# Patient Record
Sex: Male | Born: 1955
Health system: Southern US, Community
[De-identification: ages and names within clinical notes are randomized; demographics above are authoritative.]

## PROBLEM LIST (undated history)

## (undated) DIAGNOSIS — C801 Malignant (primary) neoplasm, unspecified: Secondary | ICD-10-CM

---

## 2002-06-30 ENCOUNTER — Emergency Department (HOSPITAL_COMMUNITY): Admission: EM | Admit: 2002-06-30 | Discharge: 2002-06-30 | Payer: Self-pay | Admitting: Emergency Medicine

## 2003-09-01 ENCOUNTER — Emergency Department (HOSPITAL_COMMUNITY): Admission: EM | Admit: 2003-09-01 | Discharge: 2003-09-01 | Payer: Self-pay | Admitting: *Deleted

## 2003-11-14 ENCOUNTER — Emergency Department (HOSPITAL_COMMUNITY): Admission: EM | Admit: 2003-11-14 | Discharge: 2003-11-15 | Payer: Self-pay | Admitting: Emergency Medicine

## 2007-08-01 ENCOUNTER — Ambulatory Visit: Payer: Self-pay | Admitting: Internal Medicine

## 2007-08-01 ENCOUNTER — Inpatient Hospital Stay (HOSPITAL_COMMUNITY): Admission: EM | Admit: 2007-08-01 | Discharge: 2007-08-06 | Payer: Self-pay | Admitting: Emergency Medicine

## 2007-08-23 ENCOUNTER — Ambulatory Visit: Payer: Self-pay | Admitting: Internal Medicine

## 2007-08-23 DIAGNOSIS — D508 Other iron deficiency anemias: Secondary | ICD-10-CM | POA: Insufficient documentation

## 2007-08-23 DIAGNOSIS — D509 Iron deficiency anemia, unspecified: Secondary | ICD-10-CM | POA: Insufficient documentation

## 2007-08-23 DIAGNOSIS — J82 Pulmonary eosinophilia, not elsewhere classified: Secondary | ICD-10-CM

## 2007-08-23 HISTORY — DX: Iron deficiency anemia, unspecified: D50.9

## 2007-11-02 ENCOUNTER — Ambulatory Visit: Payer: Self-pay | Admitting: Internal Medicine

## 2007-11-02 ENCOUNTER — Telehealth (INDEPENDENT_AMBULATORY_CARE_PROVIDER_SITE_OTHER): Payer: Self-pay | Admitting: *Deleted

## 2007-11-02 DIAGNOSIS — F17200 Nicotine dependence, unspecified, uncomplicated: Secondary | ICD-10-CM

## 2007-11-02 DIAGNOSIS — J449 Chronic obstructive pulmonary disease, unspecified: Secondary | ICD-10-CM

## 2007-11-02 HISTORY — DX: Chronic obstructive pulmonary disease, unspecified: J44.9

## 2007-12-06 ENCOUNTER — Ambulatory Visit: Payer: Self-pay | Admitting: Internal Medicine

## 2008-04-18 ENCOUNTER — Telehealth (INDEPENDENT_AMBULATORY_CARE_PROVIDER_SITE_OTHER): Payer: Self-pay | Admitting: *Deleted

## 2008-04-25 ENCOUNTER — Encounter (INDEPENDENT_AMBULATORY_CARE_PROVIDER_SITE_OTHER): Payer: Self-pay | Admitting: *Deleted

## 2008-05-28 ENCOUNTER — Ambulatory Visit: Payer: Self-pay | Admitting: Cardiovascular Disease

## 2008-05-28 ENCOUNTER — Observation Stay (HOSPITAL_COMMUNITY): Admission: EM | Admit: 2008-05-28 | Discharge: 2008-05-30 | Payer: Self-pay | Admitting: Emergency Medicine

## 2008-06-15 ENCOUNTER — Ambulatory Visit: Payer: Self-pay

## 2008-06-19 ENCOUNTER — Ambulatory Visit: Payer: Self-pay | Admitting: Cardiology

## 2008-07-03 ENCOUNTER — Ambulatory Visit: Payer: Self-pay | Admitting: Internal Medicine

## 2008-10-17 ENCOUNTER — Emergency Department (HOSPITAL_COMMUNITY): Admission: EM | Admit: 2008-10-17 | Discharge: 2008-10-17 | Payer: Self-pay | Admitting: Emergency Medicine

## 2010-06-09 ENCOUNTER — Encounter: Payer: Self-pay | Admitting: Family Medicine

## 2010-08-26 LAB — DIFFERENTIAL
Eosinophils Absolute: 0.1 10*3/uL (ref 0.0–0.7)
Lymphocytes Relative: 22 % (ref 12–46)
Lymphs Abs: 1.4 10*3/uL (ref 0.7–4.0)
Neutro Abs: 4.2 10*3/uL (ref 1.7–7.7)
Neutrophils Relative %: 67 % (ref 43–77)

## 2010-08-26 LAB — CBC
HCT: 37.2 % — ABNORMAL LOW (ref 39.0–52.0)
Hemoglobin: 11.8 g/dL — ABNORMAL LOW (ref 13.0–17.0)
RDW: 15 % (ref 11.5–15.5)
WBC: 6.4 10*3/uL (ref 4.0–10.5)

## 2010-08-26 LAB — BASIC METABOLIC PANEL
CO2: 27 mEq/L (ref 19–32)
GFR calc Af Amer: >60 mL/min (ref >60)
GFR calc non Af Amer: >60 mL/min (ref >60)
Glucose, Bld: 95 mg/dL (ref 70–99)

## 2010-09-02 LAB — CBC
HCT: 36.9 % — ABNORMAL LOW (ref 39.0–52.0)
Hemoglobin: 11.7 g/dL — ABNORMAL LOW (ref 13.0–17.0)
MCHC: 31.8 g/dL (ref 30.0–36.0)
MCV: 80.7 fL (ref 78.0–100.0)
Platelets: 220 10*3/uL (ref 150–400)
RBC: 4.58 MIL/uL (ref 4.22–5.81)
RDW: 14.8 % (ref 11.5–15.5)

## 2010-09-02 LAB — POCT CARDIAC MARKERS
CKMB, poc: <1 ng/mL — ABNORMAL LOW (ref 1.0–8.0)
Myoglobin, poc: 40.1 ng/mL (ref 12–200)

## 2010-09-02 LAB — CK TOTAL AND CKMB (NOT AT ARMC)
CK, MB: 0.7 ng/mL (ref 0.3–4.0)
Relative Index: INVALID (ref 0.0–2.5)
Total CK: 98 U/L (ref 7–232)

## 2010-09-02 LAB — LIPID PANEL
Cholesterol: 113 mg/dL (ref 0–200)
Total CHOL/HDL Ratio: 3.6 RATIO
VLDL: 13 mg/dL (ref 0–40)

## 2010-09-02 LAB — URINE MICROSCOPIC-ADD ON

## 2010-09-02 LAB — URINALYSIS, ROUTINE W REFLEX MICROSCOPIC
Bilirubin Urine: NEGATIVE
Leukocytes, UA: NEGATIVE
Nitrite: NEGATIVE
Protein, ur: 30 mg/dL — AB
Specific Gravity, Urine: 1.036 — ABNORMAL HIGH (ref 1.005–1.030)
Urobilinogen, UA: 1 mg/dL (ref 0.0–1.0)

## 2010-09-02 LAB — CARDIAC PANEL(CRET KIN+CKTOT+MB+TROPI)
CK, MB: 0.7 ng/mL (ref 0.3–4.0)
CK, MB: 0.8 ng/mL (ref 0.3–4.0)
Relative Index: INVALID (ref 0.0–2.5)
Total CK: 72 U/L (ref 7–232)
Total CK: 81 U/L (ref 7–232)
Troponin I: 0.01 ng/mL (ref 0.00–0.06)

## 2010-09-02 LAB — COMPREHENSIVE METABOLIC PANEL
AST: 23 U/L (ref 0–37)
Albumin: 3.5 g/dL (ref 3.5–5.2)
Alkaline Phosphatase: 49 U/L (ref 39–117)
GFR calc non Af Amer: >60 mL/min (ref >60)
Potassium: 3.6 mEq/L (ref 3.5–5.1)
Total Protein: 6 g/dL (ref 6.0–8.3)

## 2010-09-02 LAB — DIFFERENTIAL
Basophils Absolute: 0 10*3/uL (ref 0.0–0.1)
Basophils Relative: 1 % (ref 0–1)
Eosinophils Absolute: 0.1 10*3/uL (ref 0.0–0.7)
Lymphs Abs: 1.6 10*3/uL (ref 0.7–4.0)
Monocytes Absolute: 0.6 10*3/uL (ref 0.1–1.0)

## 2010-09-02 LAB — LIPASE, BLOOD: Lipase: 23 U/L (ref 11–59)

## 2010-09-02 LAB — TROPONIN I: Troponin I: <0.01 ng/mL (ref 0.00–0.06)

## 2010-10-01 NOTE — H&P (Signed)
[name] [name], [name] [name]             ACCOUNT NO.:  [id number]   MEDICAL RECORD NO.:  [id number]          PATIENT TYPE:  INP   LOCATION:  1424                         FACILITY:  [facility]   PHYSICIAN:  Della Goo, M.D. DATE OF BIRTH:  1955/08/19   DATE OF ADMISSION:  05/28/2008  DATE OF DISCHARGE:                              HISTORY & PHYSICAL   PRIMARY CARE PHYSICIAN:  Unassigned.   CHIEF COMPLAINT:  Chest pain.   HISTORY OF PRESENT ILLNESS:  This is a 55 year old male who presented to  the emergency department with complaints of chest pain, which he has had  off and on for the past 2 days.  He states that he has had nausea and  vomiting along with shortness of breath associated with the pain.  He  states that the discomfort is in the middle of his chest.  He denies  having any cough, congestion, fevers or chills.   PAST MEDICAL AND SURGICAL HISTORY:  The past medical history is  significant for cerebral aneurysm status post craniotomy.   MEDICATIONS:  The patient's medications at this time are none.   ALLERGIES:  No known drug allergies.   SOCIAL HISTORY:  The patient is a smoker; he has smoked one pack of  cigarettes daily for 30+ years.  He also reports drinking alcohol; he  states that he drinks two 40-ounce beers twice a week; however, he  states that he did drink heavily years before.   FAMILY HISTORY:  The family history is negative for coronary artery  disease, hypertension and diabetes.  His mother is deceased and did have  cancer.   REVIEW OF SYSTEMS:  The review of systems pertinents are mentioned  above.   PHYSICAL EXAMINATION:  GENERAL APPEARANCE:  The physical examination  findings are that this is a thin, well-developed 55 year old male in no  visible discomfort or acute distress.  VITAL SIGNS:  His vital signs are temperature 97.1, blood pressure  109/67, heart rate 91, respirations 20, and O2 saturation is 100%.  HEENT:  Normocephalic and atraumatic  head.  There is no scleral icterus  or scleral injection nor conjunctival pallor.  Pupils are equally round  and react to light.  Extraocular movements are intact.  Funduscopic is  benign.  Oropharynx is clear.  NECK:  The neck is supple with full range of motion.  No thyromegaly,  adenopathy or jugulovenous distention.  HEART:  Cardiovascular - regular rate and rhythm.  No murmurs, gallops  or rubs.  Lungs:  The lungs are clear to auscultation bilaterally.  ABDOMEN:  The abdomen has positive bowel sounds.  It is soft, nontender  and nondistended.  EXTREMITIES:  The extremities are without cyanosis, clubbing or edema.  NEUROLOGIC EXAMINATION:  The neurological examination is nonfocal.   LABORATORY STUDIES:  White blood cell count 3.6, hemoglobin 11.7,  hematocrit 36.9, platelets 220,000, with MCV 80.7, neutrophils 36% and  lymphocytes 44%.  Sodium 142, potassium 3.6, chloride 109, carbon  dioxide 27, BUN 13, creatinine 0.93, and glucose 106.  Lipase 23.  Chest  x-ray revealed extensive scarring of the left upper lobe.  No acute  infiltrates or effusions were seen.  EKG revealed a normal sinus rhythm  without acute ST-segment changes.  Point of care cardiac markers with a  myoglobin of 40.1, Ck/MB less than 1.0 and  troponin less than 0.05.  Albumin 3.5, AST 23 and ALT 17.   ASSESSMENT:  The patient is a 55 year old male being admitted with chest  pain.   PLAN:  1. The patient will be admitted to a telemetry area for cardiac      monitoring.  2. Cardiac enzymes will be performed.  3. The patient will be placed on nitrates, oxygen and aspirin therapy.  4. DVT and GI prophylaxes have also been ordered.  5. Further workup will ensue pending results of his studies and his      clinical course.  6. Stress testing will be considered.      Della Goo, M.D.  Electronically Signed     HJ/MEDQ  D:  05/29/2008  T:  05/30/2008  Job:  [id number]

## 2010-10-01 NOTE — Assessment & Plan Note (Signed)
[facility]                            CARDIOLOGY OFFICE NOTE   NAME:[name], [name]                      MRN:          [id number]  DATE:06/19/2008                            DOB:          August 08, 1955    PRIMARY CARE PHYSICIAN:  The patient has no primary care physician.   HISTORY OF PRESENT ILLNESS:  This is a 55 year old with a history of  tobacco abuse and recent admission to [facility] with chest  pain.  The patient was admitted through the emergency department at  [facility] in early January 2010 with a couple of days of nausea and  vomiting.  It was associated with chest pain across the patient's upper  abdomen and lower chest in a band.  The chest and abdominal pain had  been prolonged.  The patient was admitted.  Cardiac enzymes were  negative x3.  The patient had a CT of his abdomen with no acute  findings.  The patient was hydrated, given antiemetic medications, and  was eventually discharged for followup with exercise treadmill Myoview  as an outpatient.  The patient did have that exercise treadmill Myoview  done last week.  He exercised for 10 minutes.  He did only reach 77% of  his maximal predicted heart rate, which is submaximal.  He had no chest  pain.  He stopped due to fatigue.  There was no evidence for scar or  ischemia on the perfusion portion of the exam.  Since discharge, the  patient has had no further episodes of chest pain, nausea, or vomiting.  He has been having normal bowel movements with no melena or bright red  blood per rectum.  He did start on omeprazole after discharge from the  hospital, and he feels this helped his symptoms considerably.  He does  walk for 30-45 minutes a day with no chest pain or shortness of breath.  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  He does continue to smoke  about half pack a day.   PAST MEDICAL HISTORY:  1. Cerebral aneurysm status post craniotomy years ago.  2. Tobacco  abuse.  The patient smokes about half pack a day.  3. Prior pneumonia.  4. Prior heavy alcohol abuse.  5. Exercise treadmill Myoview, January 2010.  The patient reached 77%      of his maximal predicted heart rate.  He reached 11.7 METS and      exercised for 10 minutes.  He had no chest pain.  EF was 60%.      Normal wall motion.  There was no evidence for scar or ischemia on      the perfusion images.   MEDICATIONS:  1. Aspirin 81 mg a day.  2. Omeprazole 20 mg a day.   SOCIAL HISTORY:  The patient lives with his girlfriend.  He has no  children.  He works as a Financial risk analyst at [employer].  He smokes half pack a day  and has done so for many years.  He used to drink alcohol heavily, now  he only drinks occasionally.  LABORATORY DATA:  From January 2010; LDL 69, HDL 31, triglycerides 63,  hemoglobin A1c 5.7; cardiac enzymes negative x3; TSH is normal.   REVIEW OF SYSTEMS:  Negative except as noted in the history of present  illness.   PHYSICAL EXAMINATION:  VITAL SIGNS:  Blood pressure is 129/78, heart  rate is 93.  GENERAL:  This is a well-developed male in no apparent distress.  NEUROLOGIC:  Alert and oriented x3.  Normal affect.  LUNGS:  Clear to auscultation bilaterally with mildly prolonged  expiratory phase and normal respiratory effort.  NECK:  There is no JVD.  There is no thyromegaly or thyroid nodule.  HEENT:  Normal exam.  CARDIOVASCULAR:  Heart, regular S1 and S2.  No S3 and No S4.  No murmur.  No peripheral edema, 2+ posterior tibial pulses bilaterally.  There is  no carotid bruit.  ABDOMEN:  Soft, nontender.  No hepatosplenomegaly.  Normal bowel sounds.  EXTREMITIES:  No clubbing or cyanosis.  SKIN:  Normal exam.  MUSCULOSKELETAL:  Normal exam.   ASSESSMENT/PLAN:  This is a 55 year old with history of tobacco abuse,  who presents to Cardiology Clinic in followup after an episode of  prolonged chest pain with nausea and vomiting, which precipitated an  inpatient  admission.  1. Chest/abdominal pain associated with nausea and vomiting.  CT      abdomen was negative while in the hospital.  Cardiac enzymes were      negative x3.  The patient did have a exercise treadmill Myoview      done.  He exercised for 10 minutes, which is reasonably good      exercise tolerance; however, he only reached 77% of his maximum      predicted heart rate.  There is no evidence for scar or ischemia on      the perfusion images.  This test was submaximal; however, I do      think his chest pain is unlikely to have been cardiac in nature.  I      think it may have possibly been due to peptic ulcer disease, given      the relief of symptoms with proton pump inhibitor.  I did recommend      the patient continue to take his proton pump inhibitor along with a      low-dose aspirin 81 mg daily.  I do not think he needs any further      cardiac testing.  He has excellent exercise tolerance without chest      pain on exertion.  2. Smoking.  I did strongly encourage the patient to stop using      tobacco.  He plans to purchase nicotine patches over the counter.  3. We did tell the patient that we set him up with a primary care      physician.  He states he used to see someone across the street from      [employer], so we will set up with [facility] doctors at      [facility].     Marca Ancona, MD  Electronically Signed    DM/MedQ  DD: 06/19/2008  DT: 06/20/2008  Job #: [id number]

## 2010-10-01 NOTE — Discharge Summary (Signed)
[name] [name], [name] [name]             ACCOUNT NO.:  [id number]   MEDICAL RECORD NO.:  [id number]          PATIENT TYPE:  INP   LOCATION:  1424                         FACILITY:  [facility]   PHYSICIAN:  Eduard Clos, MDDATE OF BIRTH:  1956-05-12   DATE OF ADMISSION:  05/28/2008  DATE OF DISCHARGE:  05/30/2008                               DISCHARGE SUMMARY   COURSE IN THE HOSPITAL:  A 55 year old male with no significant  past  medical history.  Presents with complaint of chest pain.  Patient  admitted to the telemetry floor.  Cardiac enzymes and EKG were followed  which were within acceptable limits.  Since patient had risk factors, a  cardiology consult was obtained.  Per cardiology, patient can be  followed as outpatient for stress test for which they will be arranging.  Patient also had some left flank pain with some nausea earlier which has  subsided.  A CT urogram was done.  I did discuss with the cardiologist  who advised that patient had nothing acute.  As patient is tolerating  diet, I advised patient that his CT had shown nothing acute but did show  enlarged prostate.  He will need further workup on this as outpatient  with a primary care physician, for which I did advise that I can provide  some names and help with the primary care physician.  Patient said, no,  he has his own primary care physician who he did not follow and he will  be following as outpatient once he gets discharged.  Patient was  strongly advised to quit smoking.  Patient also advised that he needs to  follow with cardiology for which a number has been provided, need to  call.  At the time of discharge, patient is hemodynamically stable.   PROCEDURES DURING HIS STAY:  1. Chest x-ray on May 28, 2008.  Nothing acute, scanning the left      upper lobe.  2. CT abdomen and pelvis without contrast on May 30, 2008.      Negative for acute urinary tract obstruction not __________.  Mild      enlargement  of prostate, no acute findings.   FINAL DIAGNOSES:  1. Chest pain, rule out acute coronary syndrome.  2. Tobacco abuse.  3. Left flank pain and computed tomography negative.   MEDICATION AT DISCHARGE:  1. Aspirin 81 mg p.o. daily.  2. Omeprazole 40 mg p.o. daily.  3. Nicotine patch, as advised.   PLAN:  Patient advised to follow up with his primary care physician  within a week's time.  Advised to have further workup through his  primary care physician for his enlarged prostate.  To follow up with  cardiology as advised and to schedule to call up [phone] and make  sure laboratories are finally scheduled.  Patient strongly advised to  quit smoking.  To be on a cardiac healthy diet.     Eduard Clos, MD  Electronically Signed    ANK/MEDQ  D:  05/30/2008  T:  05/30/2008  Job:  [id number]

## 2010-10-01 NOTE — Discharge Summary (Signed)
NAMEKITT, LEDET             ACCOUNT NO.:  [id number]   MEDICAL RECORD NO.:  [id number]          PATIENT TYPE:  INP   LOCATION:  1439                         FACILITY:  [facility]   PHYSICIAN:  Mobolaji B. Bakare, M.D.DATE OF BIRTH:  October 19, 1955   DATE OF ADMISSION:  08/01/2007  DATE OF DISCHARGE:  08/06/2007                               DISCHARGE SUMMARY   FINAL DIAGNOSES:  1. Pneumonia (suspicious for cavitating pneumonia versus bullous lung      disease).  2. Ongoing tobacco abuse.  3. Microcytic anemia.   CONSULTANTS:  Pulmonary consult provided by Dr. Sherene Sires.   RECOMMENDATIONS:  1. Repeat chest x-ray PA and lateral in 2 weeks.  2. Follow up with Dr. Sherene Sires post chest x-ray.  3. Follow up with [facility] in 1 week.   PROCEDURE:  1. Chest x-ray done on July 31, 2007:  Showed patchy bilateral      airspace opacity greatest in the left lower lobe likely      representing pneumonia, bilateral hilar prominence left greater      than right.  Bleb/cavitary process in left upper lobe.  2. Chest CT scan done on July 31, 2007:  Showed multilobar airspace      disease with bronchial wall thickening most prominent in the left      lower lobe likely bronchopneumonia.  Cavitary lesion in the left      upper lobe may represent a cavitary pneumonia.  Small mediastinal      right hilar lymph nodes were identified.   BRIEF HISTORY:  In brief, Mr. Richart is a 55 year old African American  male who presented to the emergency room with fever, cough and blood  streaked sputum for 3 weeks and weight loss for about 1 month.  He could  not quantify the weight loss, but he does not reckon that it is up to 10  pounds.   Initial evaluation revealed a temperature of 101.5, pulse 94, blood  pressure of 120/64.  Chest x-ray was suspicious for cavitating  pneumonia.  This was followed by CT scan.  CT scan of the chest which  reported multilevel airspace disease and cavitary lesion in the left  upper lobe felt to represent pneumonia, but differentials include  tuberculosis.  Hence, the patient was admitted to a respiratory unit  room.  He was placed on respiratory precautions and was started on  Rocephin and Zithromax.  Sputums were sent for AFB smears.   HOSPITAL COURSE:  The patient was initially investigated for pulmonary  tuberculosis.  He had three sets of sputum smears.  These were all  negative.  He had a PPD done which was negative.  HIV was also negative.  He was seen by Dr. Sherene Sires in consultation.  The opinion that this is less  likely TB and the cavitary lesion in the left upper lobe is likely  representing bullous disease.  He agreed with antibiotics and  recommended followup chest x-ray in 2 weeks.   The patient received 6 days treatment with Zithromax and Rocephin  intravenously.  Cleocin was also started during the course of  hospitalization.  He was discharged home on Cleocin.   The patient became afebrile.  Cough and sputum production have improved.  White cell count has normalized.  He is saturating at 100% on room air.  He is stable at this point to be discharged home.   Microcytic anemia.  He was noted to have a hemoglobin of 10.2 with MCV  76.3.  Hemoglobin remained stable during the course of hospitalization.  He had anemia panel with ferritin of 413, TIBC 259, iron 32% saturation,  vitamin B-12 843, folate 11.1.  The patient was started on iron  supplement.  I will defer further anemia workup to his PCP.  He should  be following up at [facility].   DISCHARGE MEDICATIONS:  1. Cleocin 600 mg p.o. t.i.d. for 7 days.  2. Ferrous sulfate one tablet b.i.d.   PERTINENT LABORATORY DATA:  Strep pneumonia urinary antigen negative.  Sed rate 64.  Anemia panel as mentioned in the HPI.  Acute hepatitis  panel was negative.  HIV negative.  Legionella urine antigen negative.   DISCHARGE LABORATORY DATA:  Hemoglobin 10.2, hematocrit 30.3, platelets  347, white  cell 8.7, MCV 76.3, sodium 142, potassium 3.9, chloride 113,  CO2 112, BUN 1, creatinine 0.69, calcium 9.0.      Mobolaji B. Corky Downs, M.D.  Electronically Signed     MBB/MEDQ  D:  08/06/2007  T:  08/06/2007  Job:  [id number]   cc:   Charlaine Dalton. Sherene Sires, MD, FCCP  [address]  [place] [address]

## 2010-10-01 NOTE — Discharge Summary (Signed)
[name] [name], [name] [name]             ACCOUNT NO.:  [id number]   MEDICAL RECORD NO.:  [id number]          PATIENT TYPE:  INP   LOCATION:  1424                         FACILITY:  [facility]   PHYSICIAN:  Eduard Clos, MDDATE OF BIRTH:  04-13-1956   DATE OF ADMISSION:  05/28/2008  DATE OF DISCHARGE:  05/30/2008                               DISCHARGE SUMMARY   COURSE IN THE HOSPITAL:  A 55 year old male with no significant  past  medical history.  Presents with complaint of chest pain.  Patient  admitted to the telemetry floor.  Cardiac enzymes and EKG were followed  which were within acceptable limits.  Since patient had risk factors, a  cardiology consult was obtained.  Per cardiology, patient can be  followed as outpatient for stress test for which they will be arranging.  Patient also had some left flank pain with some nausea earlier which has  subsided.  A CT urogram was done.  I did discuss with the cardiologist  who advised that patient had nothing acute.  As patient is tolerating  diet, I advised patient that his CT had shown nothing acute but did show  enlarged prostate.  He will need further workup on this as outpatient  with a primary care physician, for which I did advise that I can provide  some names and help with the primary care physician.  Patient said, no,  he has his own primary care physician who he did not follow and he will  be following as outpatient once he gets discharged.  Patient was  strongly advised to quit smoking.  Patient also advised that he needs to  follow with cardiology for which a number has been provided, need to  call.  At the time of discharge, patient is hemodynamically stable.   PROCEDURES DURING HIS STAY:  1. Chest x-ray on May 28, 2008.  Nothing acute, scanning the left      upper lobe.  2. CT abdomen and pelvis without contrast on May 30, 2008.      Negative for acute urinary tract obstruction not __________.  Mild      enlargement  of prostate, no acute findings.   FINAL DIAGNOSES:  1. Chest pain, rule out acute coronary syndrome.  2. Tobacco abuse.  3. Left flank pain and computed tomography negative.   MEDICATION AT DISCHARGE:  1. Aspirin 81 mg p.o. daily.  2. Omeprazole 40 mg p.o. daily.  3. Nicotine patch, as advised.   PLAN:  Patient advised to follow up with his primary care physician  within a week's time.  Advised to have further workup through his  primary care physician for his enlarged prostate.  To follow up with  cardiology as advised and to schedule to call up [phone] and make  sure laboratories are finally scheduled.  Patient strongly advised to  quit smoking.  To be on a cardiac healthy diet.      Eduard Clos, MD    ANK/MEDQ  D:  05/30/2008  T:  05/30/2008  Job:  [id number]

## 2010-10-01 NOTE — H&P (Signed)
NAMENICOLIS, BOODY             ACCOUNT NO.:  [id number]   MEDICAL RECORD NO.:  [id number]          PATIENT TYPE:  EMS   LOCATION:  ED                           FACILITY:  [facility]   PHYSICIAN:  Raphael Gibney, MD        DATE OF BIRTH:  04-13-1956   DATE OF ADMISSION:  07/31/2007  DATE OF DISCHARGE:                              HISTORY & PHYSICAL   PRIMARY CARE PHYSICIAN:  Unassigned.   CHIEF COMPLAINT:  Productive cough with streaky hemoptysis, fevers of 3  weeks' duration, weight loss of few months' duration.   HISTORY OF PRESENT ILLNESS:  Mr. Siddhartha is a 55 year old African-  American male who comes into the emergency department with complaints of  worsening productive cough and fevers of 3 weeks' duration. He states  that his cough initially started off with minimal amounts of white  phlegm 3 weeks ago and ever since, has progressed to yellowish sputum  with unrelenting paroxysms of cough. He also reports moderate to high  grade fever at night, although he has not measured any of the fever  spikes. He states that there is also quite a bit of sweating associated  with these fevers. He does report some amount of streaky hemoptysis on  and off with the phlegm. He also mentions a sharp pain in the epigastric  and the left thoracic region during paroxysms of cough. Mr. Lucia also  states that he has been having a poor appetite in general for the last  many months. He also states that he is having some amount of weight  loss, although he has not measured his weight in the last few weeks. He  states that he does not have a primary care physician and has not seen a  physician in the last few years. He denies any new indications or recent  exposure to exotic pets at this time. He does give a history of his  male roommate having similar symptoms, although her symptoms started  to improve while his got worse over the last few days.   He denies any diarrhea, weakness, skin changes,  blurriness, paroxysmal  nocturnal dyspnea, orthopnea, nausea, vomiting, blood in his urine or  stools, polyuria, polydipsia at this time. He does give a history of a  dull headache, which has been present for many months now.   PAST MEDICAL HISTORY:  He denies any significant medical history of  coronary artery disease, diabetes mellitus, hypertension, seizure,  stroke, kidney disease. He has not sought primary preventive care in  many years. He does give a history of a brain aneurysm, which was  repaired many years ago.   ALLERGIES:  No known drug allergies.   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 He currently  shares a room with a male friend. He does give a history of 1/2 to 1  pack a day smoking for the last 20 to 25 years. Does give a history of  occasional beer, although he used to drink 1 case of beer a week a few  years ago. Denies any IV drug use  but does give a positive history for  marijuana use in the past.   FAMILY HISTORY:  Mother is currently deceased. Father is alive and  healthy. Denies any significant family history of medical problems.   MEDICATIONS:  Currently not on any medications.   REVIEW OF SYSTEMS:  GENERAL:  Mr. Ringo does give a positive history of  weight loss, fevers, chills. Denies any significant history of trauma,  nausea, vomiting, hypertension, palpitations, orthopnea, PND, nausea and  vomiting, dysphagia, change in stool habits, change in urinary  frequency, polyuria, polydipsia, polyphagia or denies nay changes in  mood or depression.   PHYSICAL EXAMINATION:  VITAL SIGNS:  Pulse 94, blood pressure 120/64,  temperature 101.5, respiratory rate 18.  GENERAL:  Mr. Syris is a poorly nourished African-American male in no  acute distress.  HEENT:  Pupils are equal, round, and reactive to light. Extraocular  muscles intact. No cyanosis, pallor, icterus noted.  LYMPHATICS:  No clearly palpable lymphadenopathy in the  supraclavicular  or infraclavicular or cervical region. A small left axillary lymph node  is palpable although right axilla is clear. There is tiny bilateral  inguinal lymphadenopathy noted.  RESPIRATORY:  Poor energy bilaterally. Some expiratory rhonchi in the  lower lobes. Post-tussive cough in the left upper zone on auscultation.  ABDOMEN:  Soft. Tenderness elicited in both the left and right upper  quadrant. No clearly palpable hepatosplenomegaly.  NEUROLOGIC:  Cranial nerves 2-12 are grossly intact. Motor function 5/5  in upper limbs. In lower limbs, sensory systems grossly intact.  Cerebellar function is within normal limits.  EXTREMITIES:  No clubbing, cyanosis, or edema.   LABORATORY DATA:  Hemoglobin and hematocrit 10.4 and 32.1. White blood  cell count 10.8. Platelets 361,000. Sodium 136, potassium 3.0, chloride  102, CO2 24, creatinine 0.8, BUN 4, glucose 99, calcium 8.3.   Chest x-ray, left lower lobe air space disease, bilateral hilar  prominence, and left lower lobe cavitary lesion noted. CT scan of the  chest pending at the time of this dictation.   ASSESSMENT/PLAN:  54. A 55 year old African-American male with cavitary lesion in the      left upper lobe and areaconsolidation in the left lower lobe. Will      admit to the inpatient service team [facility] of [facility].      Will start IV antibiotics in the form of IV Rocephin 1 gram q.24      hours and IV Zithromax 500 mg followed by 250 mg daily. Will also      start clindamycin IV 600 mg first two doses, followed by 300 mg      q.i.d. for anaerobic coverage. Will pan culture in the form of      sputum for bacteria and fungus. Blood culture, urine culture. Will      obtain AFB x3 and place a PPD due to the large cavitary lesion      noted on the left upper lobe. CT scan of the chest was pending at      the time of this dicussion. Will consider pulmonary input in      management, as this lesion appears to have the  potential to cause a      pneumothorax.  2. Mild tenderness on abdominal exam. Will obtain a hepatitis panel      and a hepatic enzyme panel.  3. Assess patient of immunocompromise state:  After discussion with      the patient, it was decided to draw for  an HIV test. The patient      has consented for this blood test.  4. Assess patient for tuberculosis: Would place the patient on      respiratory isolation until the 3 AFBs come      negative and PPD has been read off.  5. Prophylaxis:  GI prophylaxis in the form of PPI and DVT prophylaxis      in the form of SCDs.   CODE STATUS:  FULL CODE.      Raphael Gibney, MD  Electronically Signed     HV/MEDQ  D:  07/31/2007  T:  08/01/2007  Job:  [id number]

## 2010-10-01 NOTE — Consult Note (Signed)
[name] [name], [name] [name]             ACCOUNT NO.:  [id number]   MEDICAL RECORD NO.:  [id number]          PATIENT TYPE:  INP   LOCATION:  1424                         FACILITY:  [facility]   PHYSICIAN:  Verne Carrow, MDDATE OF BIRTH:  [birth year]   DATE OF CONSULTATION:  05/29/2008  DATE OF DISCHARGE:                                 CONSULTATION   ADMITTING SERVICE:  [facility].   REASON FOR CONSULTATION:  Chest pain.   HISTORY OF PRESENT ILLNESS:  Mr. Winningham is a pleasant 55 year old  African American male with a history of tobacco abuse, but no prior  history of cardiac disease who presented to the emergency department at  [facility] on May 28, 2008 with complaints of chest pain.  The patient tells me that 2 days prior to admission he awoke with nausea  and had an episode of vomiting.  He noticed mild chest discomfort after  having the episode of vomiting.  He went to work and tried to work  through most of the day, but had continuous mild chest discomfort during  the whole day.  He continued to have episodes of nausea and vomiting as  well as dizziness.  He denies having had any episodes of shortness of  breath, palpitations, syncope or orthopnea.  He has never had problems  with lower extremity edema or PND.  He tells me that his pain was dull  in nature and located in the center of his chest.  There was no  radiation of pain.  As stated above, the pain persisted for the last 3  days.  He was admitted to a telemetry bed where he has had 1 set of  negative cardiac enzymes at 11:55 p.m. last night.   PAST MEDICAL HISTORY:  1. Cerebral aneurysm status post craniotomy many years ago.  2. There is no prior history of hypertension, hyperlipidemia or      diabetes mellitus.  However, the patient has not been seen by a      physician in many years.  He does tell me that he was treated for      pneumonia earlier this year.   ALLERGIES:  No known drug  allergies.   HOME MEDICATIONS:  No home medications.   SOCIAL HISTORY:  The patient smoked 1/2 to 1 pack of cigarettes per day  for the last 35 years.  He has a history of heavy alcohol abuse, but  currently tells me that he only occasionally drinks beer.  He denies the  use of illicit drugs.  He is single and has no children.  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   FAMILY HISTORY:  The patient's mother died of cancer, and his brother  died from AIDS.  His father is alive and healthy.  He tells me there is  no family history of sudden cardiac death or coronary artery disease.   REVIEW OF SYSTEMS:  As stated in the HPI, and is, otherwise, negative.   PHYSICAL EXAMINATION:  VITALS:  Temperature 99.0, pulse 71, respiratory  rate 12, blood pressure 119/68.  GENERAL:  He is  alert and oriented x3 in no acute distress.  SKIN:  Warm and dry.  PSYCHIATRIC:  Mood and affect are appropriate.  MUSCULOSKELETAL:  Muscle strength and tone are normal.  NEUROLOGICAL:  No focal deficits.  NECK:  No JVD.  No carotid bruits.  No thyromegaly.  No lymphadenopathy.  HEENT:  Normal.  LUNGS:  Clear to auscultation bilaterally without wheezes, rhonchi or  crackles noted.  CARDIOVASCULAR:  Regular rate and rhythm without murmurs, gallops or  rubs noted.  ABDOMEN:  Soft, nontender, nondistended.  Bowel sounds are present.  EXTREMITIES:  No evidence of edema.  Pulses are 2+ in all extremities.   DIAGNOSTIC STUDIES:  1. Laboratory values at time of admission show potassium of 3.6,      creatinine 0.9, hemoglobin 12, platelets 220,000, D-dimer 0.25.      The 1st set of cardiac enzymes show a CK of 98, CK-MB of 0.7, and      troponin of less than 0.01.  2. A 12-lead EKG shows normal sinus rhythm with no signs of ischemia.  3. Chest x-ray shows no acute changes.   ASSESSMENT/PLAN:  This is a 55 year old African American male with a  history of tobacco abuse, but no known diabetes mellitus, hypertension,   hyperlipidemia or family history of heart disease who is admitted to  [facility] after 3 days of continuous mild substernal chest  discomfort associated with nausea, vomiting, and dizziness.  I agree  with continuing to rule the patient out for a myocardial infarction with  serial cardiac enzymes.  At the time of this consultation, he has only  had 1 set of negative cardiac markers.  If he rules out for a myocardial  infarction with serial cardiac enzymes, I would recommend performing an  exercise Myoview stress test.  This can be done as an inpatient or as an  outpatient.  If he does rule in for a myocardial infarction with  enzymes, then I would plan on performing a left heart catheterization.  Our consult team will continue to follow with this patient while he is  in the hospital.      Verne Carrow, MD  Electronically Signed     CM/MEDQ  D:  05/29/2008  T:  05/29/2008  Job:  [id number]

## 2011-02-10 LAB — CBC
HCT: 32.1 — ABNORMAL LOW
HCT: 29.2 — ABNORMAL LOW
Hemoglobin: 10.4 — ABNORMAL LOW
Hemoglobin: 9.6 — ABNORMAL LOW
MCHC: 32.9
MCHC: 33.5
MCV: 76.3 — ABNORMAL LOW
MCV: 76.9 — ABNORMAL LOW
Platelets: 301
Platelets: 347
RBC: 4.24
RBC: 3.97 — ABNORMAL LOW
RDW: 14.7
WBC: 10.8 — ABNORMAL HIGH

## 2011-02-10 LAB — DIFFERENTIAL
Eosinophils Relative: 0
Lymphocytes Relative: 10 — ABNORMAL LOW
Lymphs Abs: 1
Monocytes Absolute: 0.8
Monocytes Relative: 7

## 2011-02-10 LAB — COMPREHENSIVE METABOLIC PANEL
Albumin: 2.3 — ABNORMAL LOW
Alkaline Phosphatase: 79
BUN: 2 — ABNORMAL LOW
Creatinine, Ser: 0.77
Glucose, Bld: 103 — ABNORMAL HIGH
Potassium: 3.5
Total Protein: 5.5 — ABNORMAL LOW

## 2011-02-10 LAB — AFB CULTURE WITH SMEAR (NOT AT ARMC)
Acid Fast Smear: NONE SEEN
Acid Fast Smear: NONE SEEN

## 2011-02-10 LAB — FUNGUS CULTURE W SMEAR: Fungal Smear: NONE SEEN

## 2011-02-10 LAB — URINALYSIS, ROUTINE W REFLEX MICROSCOPIC
Bilirubin Urine: NEGATIVE
Nitrite: NEGATIVE
Protein, ur: NEGATIVE
Specific Gravity, Urine: 1.03
Urobilinogen, UA: 1

## 2011-02-10 LAB — HEPATITIS PANEL, ACUTE
HCV Ab: NEGATIVE
Hep A IgM: NEGATIVE
Hep B C IgM: NEGATIVE
Hepatitis B Surface Ag: NEGATIVE

## 2011-02-10 LAB — IRON AND TIBC
Iron: 32 — ABNORMAL LOW
Saturation Ratios: 12 — ABNORMAL LOW

## 2011-02-10 LAB — HEPATIC FUNCTION PANEL
ALT: 41
AST: 40 — ABNORMAL HIGH
Alkaline Phosphatase: 98
Bilirubin, Direct: 0.2
Indirect Bilirubin: 0.7
Total Bilirubin: 0.9

## 2011-02-10 LAB — BASIC METABOLIC PANEL
BUN: 1 — ABNORMAL LOW
CO2: 24
Calcium: 9
Chloride: 112
Creatinine, Ser: 0.69
GFR calc Af Amer: >60
GFR calc non Af Amer: >60
Glucose, Bld: 99
Potassium: 3 — ABNORMAL LOW
Sodium: 136

## 2011-02-10 LAB — LEGIONELLA ANTIGEN, URINE: Legionella Antigen, Urine: NEGATIVE

## 2011-02-10 LAB — VITAMIN B12: Vitamin B-12: 843 (ref 211–911)

## 2011-02-10 LAB — RETICULOCYTES: Retic Ct Pct: 1.3

## 2011-02-10 LAB — CULTURE, BLOOD (ROUTINE X 2)

## 2011-02-10 LAB — URINE MICROSCOPIC-ADD ON

## 2011-02-10 LAB — INFLUENZA A+B VIRUS AG-DIRECT(RAPID): Influenza B Ag: NEGATIVE

## 2011-02-10 LAB — SEDIMENTATION RATE: Sed Rate: 64 — ABNORMAL HIGH

## 2011-02-10 LAB — CULTURE, RESPIRATORY W GRAM STAIN: Culture: NORMAL

## 2011-02-10 LAB — URINE CULTURE: Culture: NO GROWTH

## 2011-02-10 LAB — STREP PNEUMONIAE URINARY ANTIGEN: Strep Pneumo Urinary Antigen: NEGATIVE

## 2017-10-22 ENCOUNTER — Ambulatory Visit (INDEPENDENT_AMBULATORY_CARE_PROVIDER_SITE_OTHER): Payer: Self-pay | Admitting: Physician Assistant

## 2017-10-22 ENCOUNTER — Other Ambulatory Visit: Payer: Self-pay

## 2017-10-22 ENCOUNTER — Ambulatory Visit (HOSPITAL_COMMUNITY)
Admission: RE | Admit: 2017-10-22 | Discharge: 2017-10-22 | Disposition: A | Discharge status: Home / Self Care | Payer: Self-pay | Source: Ambulatory Visit | Attending: Physician Assistant | Admitting: Physician Assistant

## 2017-10-22 ENCOUNTER — Encounter (INDEPENDENT_AMBULATORY_CARE_PROVIDER_SITE_OTHER): Payer: Self-pay | Admitting: Physician Assistant

## 2017-10-22 VITALS — BP 168/79 | HR 81 | Temp 99.3°F | Ht 62.0 in | Wt 136.4 lb

## 2017-10-22 DIAGNOSIS — M546 Pain in thoracic spine: Secondary | ICD-10-CM

## 2017-10-22 DIAGNOSIS — M5134 Other intervertebral disc degeneration, thoracic region: Secondary | ICD-10-CM | POA: Insufficient documentation

## 2017-10-22 DIAGNOSIS — R030 Elevated blood-pressure reading, without diagnosis of hypertension: Secondary | ICD-10-CM

## 2017-10-22 DIAGNOSIS — R059 Cough, unspecified: Secondary | ICD-10-CM

## 2017-10-22 DIAGNOSIS — J449 Chronic obstructive pulmonary disease, unspecified: Secondary | ICD-10-CM | POA: Insufficient documentation

## 2017-10-22 DIAGNOSIS — R05 Cough: Secondary | ICD-10-CM

## 2017-10-22 DIAGNOSIS — J984 Other disorders of lung: Secondary | ICD-10-CM | POA: Insufficient documentation

## 2017-10-22 DIAGNOSIS — F17200 Nicotine dependence, unspecified, uncomplicated: Secondary | ICD-10-CM

## 2017-10-22 MED ORDER — CETIRIZINE HCL 10 MG PO TABS: 10.0000 mg | ORAL_TABLET | Freq: Every day | ORAL | 5 refills | Status: DC

## 2017-10-22 MED ORDER — NAPROXEN 500 MG PO TABS: 500.0000 mg | ORAL_TABLET | Freq: Two times a day (BID) | ORAL | 1 refills | Status: DC

## 2017-10-22 MED ORDER — ALBUTEROL SULFATE HFA 108 (90 BASE) MCG/ACT IN AERS: 2.0000 | INHALATION_SPRAY | RESPIRATORY_TRACT | 5 refills | Status: DC | PRN

## 2017-10-22 NOTE — Progress Notes (Signed)
Subjective:  Patient ID: [name] [name], male    DOB: [birth year]  Age: 62 y.o. MRN: [id number]  CC: back pain and cough  HPI [name] [name] is a 62 y.o. male with a medical history of COPD, tobacco use disorder, ETOH abuse (quit 2 months), bullous emphysema, and CAP presents as a new patient with complaint of cough and back pain. Cough and back pain since "couple of months ago". Cough worse at night but takes anti-cough medicine from the dollar store, (unknown medicine name) which helps during the day. Also complains of exertional dyspnea. Does not currently use inhalers. Smokes 6-7 cigarettes per day, started smoking since 62 years old. Denies fever, chills, nausea, vomiting, hemoptysis, fall, injury, CP, HA, abdominal pain, urinary frequency, dysuria, rash, or GI sxs.     ROS Review of Systems  Constitutional: Negative for chills, fever and malaise/fatigue.  Eyes: Negative for blurred vision.  Respiratory: Positive for cough. Negative for shortness of breath.   Cardiovascular: Negative for chest pain and palpitations.  Gastrointestinal: Negative for abdominal pain and nausea.  Genitourinary: Negative for dysuria and hematuria.  Musculoskeletal: Positive for back pain. Negative for joint pain and myalgias.  Skin: Negative for rash.  Neurological: Negative for tingling and headaches.  Psychiatric/Behavioral: Negative for depression. The patient is not nervous/anxious.     Objective:  BP (!) 168/79 (BP Location: Right Arm, Patient Position: Sitting, Cuff Size: Normal)   Pulse 81   Temp 99.3 F (37.4 C) (Oral)   Ht 5\' 2"  (1.575 m)   Wt 136 lb 6.4 oz (61.9 kg)   SpO2 97%   BMI 24.95 kg/m   BP/Weight 10/22/2017 07/03/2008 0/35/0093  Systolic BP 818 299 371  Diastolic BP 79 74 68  Wt. (Lbs) 136.4 125.38 121  BMI 24.95 23.7 22.87      Physical Exam  Constitutional: He is oriented to person, place, and time.  Well developed, well nourished, NAD, polite  HENT:  Head:  Normocephalic and atraumatic.  Eyes: No scleral icterus.  Neck: Normal range of motion. Neck supple. No thyromegaly present.  Cardiovascular: Normal rate, regular rhythm and normal heart sounds. Exam reveals no gallop and no friction rub.  No murmur heard. Pulmonary/Chest: Effort normal and breath sounds normal. He has no wheezes.  Diffuse coarse rales  Musculoskeletal: He exhibits no edema.  Patient unable to flex forward uniformly, lower on left side. Scoliosis not apparent on physical exam.Muscular tonicity normal bilaterally.  Neurological: He is alert and oriented to person, place, and time.  Skin: Skin is warm and dry. No rash noted. No erythema. No pallor.  Psychiatric: He has a normal mood and affect. His behavior is normal. Thought content normal.  Vitals reviewed.    Assessment & Plan:   1. Cough - DG Chest 2 View; Future - CBC with Differential - Comprehensive metabolic panel - TB Skin Test; Future - albuterol (PROVENTIL HFA;VENTOLIN HFA) 108 (90 Base) MCG/ACT inhaler; Inhale 2 puffs into the lungs every 4 (four) hours as needed for wheezing or shortness of breath.  Dispense: 1 Inhaler; Refill: 5 - naproxen (NAPROSYN) 500 MG tablet; Take 1 tablet (500 mg total) by mouth 2 (two) times daily with a meal.  Dispense: 14 tablet; Refill: 1 - cetirizine (ZYRTEC) 10 MG tablet; Take 1 tablet (10 mg total) by mouth daily.  Dispense: 30 tablet; Refill: 5  2. Tobacco use disorder - Pt needs to apply for CAFA in order to use [facility] and obtain Chantix.  3. Chronic obstructive  pulmonary disease, unspecified COPD type ([place]) - Will likely need referral to Pulmonologist but patient needs to apply for CAFA  4. Acute bilateral thoracic back pain - XR Thoracic Spine 2 View  5. Elevated blood pressure reading without diagnosis of hypertension - Return in 4 weeks for reevaluation.  Meds ordered this encounter  Medications  . albuterol (PROVENTIL HFA;VENTOLIN HFA) 108 (90 Base)  MCG/ACT inhaler    Sig: Inhale 2 puffs into the lungs every 4 (four) hours as needed for wheezing or shortness of breath.    Dispense:  1 Inhaler    Refill:  5    Order Specific Question:   Supervising Provider    Answer:   Charlott Rakes [[id number]]  . naproxen (NAPROSYN) 500 MG tablet    Sig: Take 1 tablet (500 mg total) by mouth 2 (two) times daily with a meal.    Dispense:  14 tablet    Refill:  1    Order Specific Question:   Supervising Provider    Answer:   Charlott Rakes [[id number]]  . cetirizine (ZYRTEC) 10 MG tablet    Sig: Take 1 tablet (10 mg total) by mouth daily.    Dispense:  30 tablet    Refill:  5    Order Specific Question:   Supervising Provider    Answer:   Charlott Rakes [[id number]]    Follow-up: Return in about 1 month (around 11/19/2017) for BP and cough.   Clent Demark PA

## 2017-10-22 NOTE — Patient Instructions (Addendum)
Chronic Obstructive Pulmonary Disease Chronic obstructive pulmonary disease (COPD) is a long-term (chronic) lung problem. When you have COPD, it is hard for air to get in and out of your lungs. The way your lungs work will never return to normal. Usually the condition gets worse over time. There are things you can do to keep yourself as healthy as possible. Your doctor may treat your condition with:  Medicines.  Quitting smoking, if you smoke.  Rehabilitation. This may involve a team of specialists.  Oxygen.  Exercise and changes to your diet.  Lung surgery.  Comfort measures (palliative care).  Follow these instructions at home: Medicines  Take over-the-counter and prescription medicines only as told by your doctor.  Talk to your doctor before taking any cough or allergy medicines. You may need to avoid medicines that cause your lungs to be dry. Lifestyle  If you smoke, stop. Smoking makes the problem worse. If you need help quitting, ask your doctor.  Avoid being around things that make your breathing worse. This may include smoke, chemicals, and fumes.  Stay active, but remember to also rest.  Learn and use tips on how to relax.  Make sure you get enough sleep. Most adults need at least 7 hours a night.  Eat healthy foods. Eat smaller meals more often. Rest before meals. Controlled breathing  Learn and use tips on how to control your breathing as told by your doctor. Try: ? Breathing in (inhaling) through your nose for 1 second. Then, pucker your lips and breath out (exhale) through your lips for 2 seconds. ? Putting one hand on your belly (abdomen). Breathe in slowly through your nose for 1 second. Your hand on your belly should move out. Pucker your lips and breathe out slowly through your lips. Your hand on your belly should move in as you breathe out. Controlled coughing  Learn and use controlled coughing to clear mucus from your lungs. The steps are: 1. Lean your  head a little forward. 2. Breathe in deeply. 3. Try to hold your breath for 3 seconds. 4. Keep your mouth slightly open while coughing 2 times. 5. Spit any mucus out into a tissue. 6. Rest and do the steps again 1 or 2 times as needed. General instructions  Make sure you get all the shots (vaccines) that your doctor recommends. Ask your doctor about a flu shot and a pneumonia shot.  Use oxygen therapy and therapy to help improve your lungs (pulmonary rehabilitation) if told by your doctor. If you need home oxygen therapy, ask your doctor if you should buy a tool to measure your oxygen level (oximeter).  Make a COPD action plan with your doctor. This helps you know what to do if you feel worse than usual.  Manage any other conditions you have as told by your doctor.  Avoid going outside when it is very hot, cold, or humid.  Avoid people who have a sickness you can catch (contagious).  Keep all follow-up visits as told by your doctor. This is important. Contact a doctor if:  You cough up more mucus than usual.  There is a change in the color or thickness of the mucus.  It is harder to breathe than usual.  Your breathing is faster than usual.  You have trouble sleeping.  You need to use your medicines more often than usual.  You have trouble doing your normal activities such as getting dressed or walking around the house. Get help right away if:    You have shortness of breath while resting.  You have shortness of breath that stops you from: ? Being able to talk. ? Doing normal activities.  Your chest hurts for longer than 5 minutes.  Your skin color is more blue than usual.  Your pulse oximeter shows that you have low oxygen for longer than 5 minutes.  You have a fever.  You feel too tired to breathe normally. Summary  Chronic obstructive pulmonary disease (COPD) is a long-term lung problem.  The way your lungs work will never return to normal. Usually the  condition gets worse over time. There are things you can do to keep yourself as healthy as possible.  Take over-the-counter and prescription medicines only as told by your doctor.  If you smoke, stop. Smoking makes the problem worse. This information is not intended to replace advice given to you by your health care provider. Make sure you discuss any questions you have with your health care provider. Document Released: 10/22/2007 Document Revised: 10/11/2015 Document Reviewed: 12/30/2012 Elsevier Interactive Patient Education  2017 Elsevier Inc.   Cough, Adult Coughing is a reflex that clears your throat and your airways. Coughing helps to heal and protect your lungs. It is normal to cough occasionally, but a cough that happens with other symptoms or lasts a long time may be a sign of a condition that needs treatment. A cough may last only 2-3 weeks (acute), or it may last longer than 8 weeks (chronic). What are the causes? Coughing is commonly caused by:  Breathing in substances that irritate your lungs.  A viral or bacterial respiratory infection.  Allergies.  Asthma.  Postnasal drip.  Smoking.  Acid backing up from the stomach into the esophagus (gastroesophageal reflux).  Certain medicines.  Chronic lung problems, including COPD (or rarely, lung cancer).  Other medical conditions such as heart failure.  Follow these instructions at home: Pay attention to any changes in your symptoms. Take these actions to help with your discomfort:  Take medicines only as told by your health care provider. ? If you were prescribed an antibiotic medicine, take it as told by your health care provider. Do not stop taking the antibiotic even if you start to feel better. ? Talk with your health care provider before you take a cough suppressant medicine.  Drink enough fluid to keep your urine clear or pale yellow.  If the air is dry, use a cold steam vaporizer or humidifier in your bedroom  or your home to help loosen secretions.  Avoid anything that causes you to cough at work or at home.  If your cough is worse at night, try sleeping in a semi-upright position.  Avoid cigarette smoke. If you smoke, quit smoking. If you need help quitting, ask your health care provider.  Avoid caffeine.  Avoid alcohol.  Rest as needed.  Contact a health care provider if:  You have new symptoms.  You cough up pus.  Your cough does not get better after 2-3 weeks, or your cough gets worse.  You cannot control your cough with suppressant medicines and you are losing sleep.  You develop pain that is getting worse or pain that is not controlled with pain medicines.  You have a fever.  You have unexplained weight loss.  You have night sweats. Get help right away if:  You cough up blood.  You have difficulty breathing.  Your heartbeat is very fast. This information is not intended to replace advice given to you by  your health care provider. Make sure you discuss any questions you have with your health care provider. Document Released: 11/01/2010 Document Revised: 10/11/2015 Document Reviewed: 07/12/2014 Elsevier Interactive Patient Education  [employer].

## 2017-10-22 NOTE — Addendum Note (Signed)
Addended by: Domenica Fail D on: 10/22/2017 03:40 PM   Modules accepted: Orders

## 2017-10-23 ENCOUNTER — Other Ambulatory Visit: Payer: Self-pay | Admitting: *Deleted

## 2017-10-23 ENCOUNTER — Telehealth: Payer: Self-pay | Admitting: *Deleted

## 2017-10-23 DIAGNOSIS — R05 Cough: Secondary | ICD-10-CM

## 2017-10-23 DIAGNOSIS — R059 Cough, unspecified: Secondary | ICD-10-CM

## 2017-10-23 LAB — COMPREHENSIVE METABOLIC PANEL
A/G RATIO: 1.8 (ref 1.2–2.2)
ALK PHOS: 70 IU/L (ref 39–117)
ALT: 11 IU/L (ref 0–44)
AST: 11 IU/L (ref 0–40)
Albumin: 4.4 g/dL (ref 3.6–4.8)
BUN / CREAT RATIO: 8 — AB (ref 10–24)
BUN: 6 mg/dL — ABNORMAL LOW (ref 8–27)
Bilirubin Total: 0.3 mg/dL (ref 0.0–1.2)
CALCIUM: 9.6 mg/dL (ref 8.6–10.2)
CO2: 22 mmol/L (ref 20–29)
CREATININE: 0.74 mg/dL — AB (ref 0.76–1.27)
Chloride: 106 mmol/L (ref 96–106)
GFR calc Af Amer: 116 mL/min/{1.73_m2} (ref >59)
GFR calc non Af Amer: 100 mL/min/{1.73_m2} (ref >59)
GLOBULIN, TOTAL: 2.4 g/dL (ref 1.5–4.5)
Glucose: 80 mg/dL (ref 65–99)
Potassium: 3.9 mmol/L (ref 3.5–5.2)
SODIUM: 142 mmol/L (ref 134–144)
Total Protein: 6.8 g/dL (ref 6.0–8.5)

## 2017-10-23 LAB — CBC WITH DIFFERENTIAL/PLATELET
BASOS: 0 %
Basophils Absolute: 0 10*3/uL (ref 0.0–0.2)
EOS (ABSOLUTE): 0.1 10*3/uL (ref 0.0–0.4)
EOS: 2 %
HEMATOCRIT: 36.3 % — AB (ref 37.5–51.0)
HEMOGLOBIN: 11.6 g/dL — AB (ref 13.0–17.7)
Immature Grans (Abs): 0 10*3/uL (ref 0.0–0.1)
Immature Granulocytes: 0 %
LYMPHS ABS: 2.1 10*3/uL (ref 0.7–3.1)
Lymphs: 38 %
MCH: 24.2 pg — ABNORMAL LOW (ref 26.6–33.0)
MCHC: 32 g/dL (ref 31.5–35.7)
MCV: 76 fL — AB (ref 79–97)
MONOCYTES: 10 %
MONOS ABS: 0.5 10*3/uL (ref 0.1–0.9)
Neutrophils Absolute: 2.7 10*3/uL (ref 1.4–7.0)
Neutrophils: 50 %
Platelets: 290 10*3/uL (ref 150–450)
RBC: 4.8 x10E6/uL (ref 4.14–5.80)
RDW: 16.4 % — ABNORMAL HIGH (ref 12.3–15.4)
WBC: 5.4 10*3/uL (ref 3.4–10.8)

## 2017-10-23 MED ORDER — ALBUTEROL SULFATE HFA 108 (90 BASE) MCG/ACT IN AERS: 2.0000 | INHALATION_SPRAY | RESPIRATORY_TRACT | 5 refills | Status: DC | PRN

## 2017-10-23 NOTE — Telephone Encounter (Addendum)
Phebe Colla, RN        Stable COPD and left upper lobe scarring. No active disease in the lungs. The back shows mild scoliosis and some degenerative/arthritic changes. No treatment for this except for occasional anti-inflammatory use.     Phebe Colla, RN        Labs show some anemia. Please advise to take OTC Gentle Iron pills. I will work up his anemia at his follow up.     Left message for patient to return call. Unable to reach patient.

## 2017-10-26 ENCOUNTER — Encounter (INDEPENDENT_AMBULATORY_CARE_PROVIDER_SITE_OTHER): Payer: Self-pay

## 2017-10-26 ENCOUNTER — Other Ambulatory Visit (INDEPENDENT_AMBULATORY_CARE_PROVIDER_SITE_OTHER): Payer: Self-pay

## 2017-10-26 DIAGNOSIS — R059 Cough, unspecified: Secondary | ICD-10-CM

## 2017-10-26 DIAGNOSIS — J449 Chronic obstructive pulmonary disease, unspecified: Secondary | ICD-10-CM

## 2017-10-26 DIAGNOSIS — Z111 Encounter for screening for respiratory tuberculosis: Secondary | ICD-10-CM

## 2017-10-26 DIAGNOSIS — R05 Cough: Secondary | ICD-10-CM

## 2017-10-26 MED ORDER — ALBUTEROL SULFATE HFA 108 (90 BASE) MCG/ACT IN AERS: 2.0000 | INHALATION_SPRAY | RESPIRATORY_TRACT | 5 refills | Status: DC | PRN

## 2017-10-26 NOTE — Progress Notes (Signed)
   Subjective:  Patient ID: [name] [name], male    DOB: 1955/12/19  Age: 62 y.o. MRN: [id number]  CC: PPD  HPI Brallan Denio is a 62 y.o. male with a medical history of COPD, tobacco use disorder, ETOH abuse (quit 2 months), bullous emphysema, and CAP presents for PPD placement. Pt states he was unable to afford Albuterol inhaler at [facility].      Outpatient Medications Prior to Visit  Medication Sig Dispense Refill  . albuterol (PROVENTIL HFA;VENTOLIN HFA) 108 (90 Base) MCG/ACT inhaler Inhale 2 puffs into the lungs every 4 (four) hours as needed for wheezing or shortness of breath. 1 Inhaler 5  . cetirizine (ZYRTEC) 10 MG tablet Take 1 tablet (10 mg total) by mouth daily. 30 tablet 5  . naproxen (NAPROSYN) 500 MG tablet Take 1 tablet (500 mg total) by mouth 2 (two) times daily with a meal. 14 tablet 1   No facility-administered medications prior to visit.      ROS ROS  Objective:  There were no vitals taken for this visit.  BP/Weight 10/22/2017 07/03/2008 12/29/7515  Systolic BP 001 749 449  Diastolic BP 79 74 68  Wt. (Lbs) 136.4 125.38 121  BMI 24.95 23.7 22.87      Physical Exam   Assessment & Plan:   1. Chronic obstructive pulmonary disease, unspecified COPD type ([place]) - PPD placed today. - Albuterol sent to [place] since he was unable to afford at [facility].  2. Cough - albuterol (PROVENTIL HFA;VENTOLIN HFA) 108 (90 Base) MCG/ACT inhaler; Inhale 2 puffs into the lungs every 4 (four) hours as needed for wheezing or shortness of breath.  Dispense: 1 Inhaler; Refill: 5 - TB Skin Test placed today.  3. Encounter for PPD test [place] placed PPD. Patient advised to return between 48 hrs and 72 hrs.   Meds ordered this encounter  Medications  . albuterol (PROVENTIL HFA;VENTOLIN HFA) 108 (90 Base) MCG/ACT inhaler    Sig: Inhale 2 puffs into the lungs every 4 (four) hours as needed for wheezing or shortness of breath.    Dispense:  1 Inhaler    Refill:  5   Order Specific Question:   Supervising Provider    Answer:   Charlott Rakes [[id number]]    Follow-up: Return in about 2 days (around 10/28/2017) for PPD READING.   [facility]

## 2017-10-26 NOTE — Patient Instructions (Signed)
Tuberculin Skin Test  Why am I having this test?  Tuberculosis (TB) is a bacterial infection caused by Mycobacterium tuberculosis. Most people who are exposed to these bacteria have a strong enough defense (immune) system to prevent the bacteria from causing TB and developing symptoms. Their bodies prevent the germs from being active and making them sick (latent TB infection).  However, if you have TB germs in your body and your immune system is weak, you can develop a TB infection. This can cause symptoms such as:  · Night sweats.  · Fever.  · Weakness.  · Weight loss.    A latent TB infection can also become active later in life if your immune system becomes weakened or compromised.  You may have this test if your health care provider suspects that you have TB. You may also have this test to screen for TB if you are at risk for getting the disease. Those at increased risk include:  · People who inject illegal drugs or share needles.  · People with HIV or other diseases that affect immunity.  · Health care workers.  · People who live in high-risk communities, such as homeless shelters, nursing homes, and correctional facilities.  · People who have been in contact with someone with TB.  · People from countries where TB is more common.    If you are in a high-risk group, your health care provider may wish to screen for TB more often. This can help prevent the spread of the disease. Sometimes TB screening is required when starting a new job, such as becoming a health care worker or a teacher. Colleges or universities may require it of new students.  What is being tested?  A tuberculin skin test is the main test used to check for exposure to the bacteria that can cause TB. The test checks for antibodies to the bacteria. Antibodies are proteins that your body produces to protect you from germs and other things that can make you sick.  Your health care provider will inject a solution known as PPD (purified  protein derivative) under the first layer of skin on your arm. This causes a blister-like bubble to form at the site. Your health care provider will then examine the site after a number of hours have passed to see if a reaction has occurred.  How do I prepare for this test?  There is no preparation required for this test.  What do the results mean?  Your test results will be reported as either negative or positive.  If the tuberculin skin test produces a negative result, it is likely that you do not have TB and have not been exposed to the TB bacteria.  If you or your health care provider suspects exposure, however, you may want to repeat the test a few weeks later. A blood test may also be used to check for TB. This is because you will not react to the tuberculin skin test until several weeks after exposure to TB bacteria.  If you test positive to the tuberculin skin test, it is likely that you have been exposed to TB bacteria. The test does not distinguish between an active and a latent TB infection.  A false-positive result can occur. A false-positive result for TB bacteria is incorrect because it indicates a condition or finding is present when it is not.  Talk to your health care provider to discuss your results, treatment options, and if necessary, the need for more tests.    It is your responsibility to obtain your test results. Ask the lab or department performing the test when and how you will get your results. Talk with your health care provider if you have any questions about your results.  Talk with your health care provider to discuss your results, treatment options, and if necessary, the need for more tests. Talk with your health care provider if you have any questions about your results.  This information is not intended to replace advice given to you by your health care provider. Make sure you discuss any questions you have with your health care provider.   Document Released: 02/12/2005 Document Revised: 01/06/2016 Document Reviewed: 08/29/2013  Elsevier Interactive Patient Education © 2018 Elsevier Inc.

## 2017-10-28 ENCOUNTER — Other Ambulatory Visit (INDEPENDENT_AMBULATORY_CARE_PROVIDER_SITE_OTHER): Payer: Self-pay

## 2017-10-28 ENCOUNTER — Encounter (INDEPENDENT_AMBULATORY_CARE_PROVIDER_SITE_OTHER): Payer: Self-pay | Admitting: Physician Assistant

## 2017-10-28 LAB — TB SKIN TEST: TB SKIN TEST: NEGATIVE

## 2017-11-23 ENCOUNTER — Ambulatory Visit (INDEPENDENT_AMBULATORY_CARE_PROVIDER_SITE_OTHER): Payer: Self-pay | Admitting: Physician Assistant

## 2017-11-27 ENCOUNTER — Encounter (INDEPENDENT_AMBULATORY_CARE_PROVIDER_SITE_OTHER): Payer: Self-pay | Admitting: Physician Assistant

## 2017-11-27 ENCOUNTER — Other Ambulatory Visit: Payer: Self-pay

## 2017-11-27 ENCOUNTER — Ambulatory Visit (INDEPENDENT_AMBULATORY_CARE_PROVIDER_SITE_OTHER): Payer: Self-pay | Admitting: Physician Assistant

## 2017-11-27 VITALS — BP 159/83 | HR 100 | Temp 97.9°F | Ht 62.0 in | Wt 140.2 lb

## 2017-11-27 DIAGNOSIS — I10 Essential (primary) hypertension: Secondary | ICD-10-CM

## 2017-11-27 DIAGNOSIS — R059 Cough, unspecified: Secondary | ICD-10-CM

## 2017-11-27 DIAGNOSIS — J449 Chronic obstructive pulmonary disease, unspecified: Secondary | ICD-10-CM

## 2017-11-27 DIAGNOSIS — R05 Cough: Secondary | ICD-10-CM

## 2017-11-27 DIAGNOSIS — D649 Anemia, unspecified: Secondary | ICD-10-CM

## 2017-11-27 DIAGNOSIS — Z23 Encounter for immunization: Secondary | ICD-10-CM

## 2017-11-27 DIAGNOSIS — F17200 Nicotine dependence, unspecified, uncomplicated: Secondary | ICD-10-CM

## 2017-11-27 MED ORDER — BUDESONIDE-FORMOTEROL FUMARATE 80-4.5 MCG/ACT IN AERO: 2.0000 | INHALATION_SPRAY | Freq: Two times a day (BID) | RESPIRATORY_TRACT | 11 refills | Status: DC

## 2017-11-27 MED ORDER — CETIRIZINE HCL 10 MG PO TABS: 10.0000 mg | ORAL_TABLET | Freq: Every day | ORAL | 5 refills | Status: DC

## 2017-11-27 MED ORDER — ALBUTEROL SULFATE HFA 108 (90 BASE) MCG/ACT IN AERS: 2.0000 | INHALATION_SPRAY | RESPIRATORY_TRACT | 11 refills | Status: DC | PRN

## 2017-11-27 MED ORDER — NICOTINE 21 MG/24HR TD PT24: 21.0000 mg | MEDICATED_PATCH | Freq: Every day | TRANSDERMAL | 0 refills | Status: DC

## 2017-11-27 MED ORDER — HYDROCHLOROTHIAZIDE 25 MG PO TABS: 25.0000 mg | ORAL_TABLET | Freq: Every day | ORAL | 11 refills | Status: DC

## 2017-11-27 MED ORDER — NAPROXEN 500 MG PO TABS: 500.0000 mg | ORAL_TABLET | Freq: Two times a day (BID) | ORAL | 1 refills | Status: DC

## 2017-11-27 MED FILL — NICOTINE 14 MG/24HR PATCH: 14 | 28 days supply | Qty: 28 | Fill #0

## 2017-11-27 MED FILL — HYDROCHLOROTHIAZIDE 25 MG T: 25 | 30 days supply | Qty: 30 | Fill #0

## 2017-11-27 MED FILL — NAPROXEN 500 MG TABLET: 500 | 7 days supply | Qty: 14 | Fill #0

## 2017-11-27 MED FILL — SYMBICORT 80-4.5 MCG INH: 80-4.5 | 30 days supply | Qty: 10 | Fill #0

## 2017-11-27 MED FILL — !VENTOLIN HFA INHALER: 108 (90 BAS | 16 days supply | Qty: 18 | Fill #0

## 2017-11-27 NOTE — Progress Notes (Signed)
Subjective:  Patient ID: [name] [name], male    DOB: 28-Apr-1956  Age: 62 y.o. MRN: [id number]  CC: BP and cough  HPI  [name] [name] is a 62 y.o. male with a medical history of COPD, tobacco use disorder, ETOH abuse, bullous emphysema, and CAP presents to f/u on cough and HTN. Feeling mildly better with naproxen, cetirizine, and albuterol use. Still smoking about half a pack a day. Smoking since he was 62 yo. Says he filled out the CAFA application but has not turned it in. Blood pressure remains elevated. Says he began to drink "a little" again. Does not endorse CP, palpitations, HA, tingling, numbness, abdominal pain, f/c/n/v, rash, or GI/GU sxs.     Outpatient Medications Prior to Visit  Medication Sig Dispense Refill  . cetirizine (ZYRTEC) 10 MG tablet Take 1 tablet (10 mg total) by mouth daily. 30 tablet 5  . naproxen (NAPROSYN) 500 MG tablet Take 1 tablet (500 mg total) by mouth 2 (two) times daily with a meal. 14 tablet 1  . albuterol (PROVENTIL HFA;VENTOLIN HFA) 108 (90 Base) MCG/ACT inhaler Inhale 2 puffs into the lungs every 4 (four) hours as needed for wheezing or shortness of breath. 1 Inhaler 5   No facility-administered medications prior to visit.      ROS Review of Systems  Constitutional: Negative for chills, fever and malaise/fatigue.  Eyes: Negative for blurred vision.  Respiratory: Positive for cough. Negative for shortness of breath.   Cardiovascular: Negative for chest pain and palpitations.  Gastrointestinal: Negative for abdominal pain and nausea.  Genitourinary: Negative for dysuria and hematuria.  Musculoskeletal: Negative for joint pain and myalgias.  Skin: Negative for rash.  Neurological: Negative for tingling and headaches.  Psychiatric/Behavioral: Negative for depression. The patient is not nervous/anxious.     Objective:  BP (!) 159/83 (BP Location: Left Arm, Patient Position: Sitting, Cuff Size: Normal)   Pulse 100   Temp 97.9 F (36.6 C)  (Oral)   Ht 5\' 2"  (1.575 m)   Wt 140 lb 3.2 oz (63.6 kg)   SpO2 96%   BMI 25.64 kg/m   BP/Weight 11/27/2017 10/22/2017 4/49/6759  Systolic BP 163 846 659  Diastolic BP 83 79 74  Wt. (Lbs) 140.2 136.4 125.38  BMI 25.64 24.95 23.7      Physical Exam  Constitutional: He is oriented to person, place, and time.  Well developed, well nourished, NAD, polite, not well versed  HENT:  Head: Normocephalic and atraumatic.  Eyes: No scleral icterus.  Neck: Normal range of motion. Neck supple. No thyromegaly present.  Cardiovascular: Normal rate, regular rhythm and normal heart sounds.  Pulmonary/Chest: Effort normal and breath sounds normal.  Musculoskeletal: He exhibits no edema.  Neurological: He is alert and oriented to person, place, and time.  Skin: Skin is warm and dry. No rash noted. No erythema. No pallor.  Psychiatric: He has a normal mood and affect. His behavior is normal. Thought content normal.  Vitals reviewed.    Assessment & Plan:   1. Cough - budesonide-formoterol (SYMBICORT) 80-4.5 MCG/ACT inhaler; Inhale 2 puffs into the lungs 2 (two) times daily.  Dispense: 1 Inhaler; Refill: 11 - cetirizine (ZYRTEC) 10 MG tablet; Take 1 tablet (10 mg total) by mouth daily.  Dispense: 30 tablet; Refill: 5 - albuterol (PROVENTIL HFA;VENTOLIN HFA) 108 (90 Base) MCG/ACT inhaler; Inhale 2 puffs into the lungs every 4 (four) hours as needed for wheezing or shortness of breath.  Dispense: 1 Inhaler; Refill: 11 - naproxen (NAPROSYN) 500  MG tablet; Take 1 tablet (500 mg total) by mouth 2 (two) times daily with a meal.  Dispense: 14 tablet; Refill: 1  2. Tobacco use disorder - nicotine (NICODERM CQ - DOSED IN MG/24 HOURS) 21 mg/24hr patch; Place 1 patch (21 mg total) onto the skin daily.  Dispense: 28 patch; Refill: 0  3. Chronic obstructive pulmonary disease, unspecified COPD type (HCC) - budesonide-formoterol (SYMBICORT) 80-4.5 MCG/ACT inhaler; Inhale 2 puffs into the lungs 2 (two) times  daily.  Dispense: 1 Inhaler; Refill: 11 - albuterol (PROVENTIL HFA;VENTOLIN HFA) 108 (90 Base) MCG/ACT inhaler; Inhale 2 puffs into the lungs every 4 (four) hours as needed for wheezing or shortness of breath.  Dispense: 1 Inhaler; Refill: 11  4. Hypertension, unspecified type - hydrochlorothiazide (HYDRODIURIL) 25 MG tablet; Take 1 tablet (25 mg total) by mouth daily. Take on tablet in the morning.  Dispense: 30 tablet; Refill: 11 - Lipid panel  5. Need for Tdap vaccination - Tdap vaccine greater than or equal to 61yo IM  6. Anemia, unspecified type - CBC with Differential   Meds ordered this encounter  Medications  . hydrochlorothiazide (HYDRODIURIL) 25 MG tablet    Sig: Take 1 tablet (25 mg total) by mouth daily. Take on tablet in the morning.    Dispense:  30 tablet    Refill:  11    Order Specific Question:   Supervising Provider    Answer:   Charlott Rakes [[id number]]  . budesonide-formoterol (SYMBICORT) 80-4.5 MCG/ACT inhaler    Sig: Inhale 2 puffs into the lungs 2 (two) times daily.    Dispense:  1 Inhaler    Refill:  11    Order Specific Question:   Supervising Provider    Answer:   Charlott Rakes [[id number]]  . cetirizine (ZYRTEC) 10 MG tablet    Sig: Take 1 tablet (10 mg total) by mouth daily.    Dispense:  30 tablet    Refill:  5    Order Specific Question:   Supervising Provider    Answer:   Charlott Rakes [[id number]]  . albuterol (PROVENTIL HFA;VENTOLIN HFA) 108 (90 Base) MCG/ACT inhaler    Sig: Inhale 2 puffs into the lungs every 4 (four) hours as needed for wheezing or shortness of breath.    Dispense:  1 Inhaler    Refill:  11    Order Specific Question:   Supervising Provider    Answer:   Charlott Rakes [[id number]]  . naproxen (NAPROSYN) 500 MG tablet    Sig: Take 1 tablet (500 mg total) by mouth 2 (two) times daily with a meal.    Dispense:  14 tablet    Refill:  1    Order Specific Question:   Supervising Provider    Answer:   Charlott Rakes [[id number]]  . nicotine  (NICODERM CQ - DOSED IN MG/24 HOURS) 21 mg/24hr patch    Sig: Place 1 patch (21 mg total) onto the skin daily.    Dispense:  28 patch    Refill:  0    Order Specific Question:   Supervising Provider    Answer:   Charlott Rakes [[id number]]    Follow-up: Return in about 1 month (around 12/25/2017) for Tobacco use disorder and HTN.   Clent Demark PA

## 2017-11-27 NOTE — Patient Instructions (Signed)
Community Resources  Advocacy/Legal Legal Aid [place]:  1-866-219-5262  /  336-272-0148  Family Justice Center:  336-641-7233  [facility] [place] 24-hr Crisis line:  336-273-7273  Women's Resource Center, [place]:  336-275-6090  Court Watch (custody):  336-275-2346  [place] [facility]:   336-279-9299    Baby & Breastfeeding Car Seat Inspection @ Various [place] Fire Depts.- call 336-373-2177  [place] [facility]  336-832-6860  [facility] 336-878-6712  WIC: 336-641-3663 ([place]);  336-641-7571 ([place])  La Leche League:  1-877-452-5321   Childcare [place] [facility]: 336-369-5097 ([place]) / 336-887-8224 ([place])  - Child Care Resources/ Referrals/ Scholarships  - Head Start/ Early Head Start (call or apply online)  [place] DHHS: [place] Pre-K :  1-800-859-0829 / 336-274-5437   Employment / Job Search [facility] [place]: 336-275-6090 / [address]  [place] [facility] [employer] [facility] (JobLink): 336-373-5922 ([place]) / 336-882-4141 ([place])  [facility] Community Resource/ Career Center: 336-275-9801 / 336-282-7307  [facility] [facility] Job & Career Center: 336-373-3764  DHHS Work First: 336-641-3447 ([place]) / 336-641-3447 ([place])  [facility] [place]:  336-676-5871   Financial Assistance [place] [facility]:  336-553-2657  [employer]: 336-235-0368  [employer] (furniture):  336-370-4002  [place] Hel[facility] Hands: 336-373-4264  Low Income Energy Assistance  336-641-3000   Food Assistance DHHS- SNAP/ Food Stamps: 336-641-4588  WIC: [place]- 336-641-3663 ;  [place] 336-641-7571  Little Green Book- Free Meals  Little Blue Book- Free Food Pantries  During the summer, text "FOOD" to 877877   General Health / Clinics (Adults) Orange Card (for Adults) through [place] [facility]: (336) 895-4900  [facility]:   336-832-8035  [facility] [facility]:   336-832-4444  Health Department:  336-641-3245  [place]:  336-415-3877 / 336-641-2100  Planned [facility] of [place]:   336-373-0678  [facility]:   336-334-4822 x 50251   Housing [place] Housing Coalition:   336-691-9521  [place] Housing Authority:  336-275-8501  Affordable Housing Managemnt:  336-273-0568   Immigrant/ Refugee Center for [place]ians (UNCG):  336-256-1065  Faith [facility]:  336-379-0037  [employer] Institute:  336-937-4701  [employer] World Services:  336-617-0381  African Services Coalition:  336-574-2677   LGBTQ YouthSAFE  www.youthsafe[place].org  PFLAG  336-541-6754 / info@[place].org  The Trevor Project:  1-866-488-7386   Mental Health/ Substance Use [facility] [place]  336-387-6161  [facility]:  336-832-9700 or 1-800-711-2635  [facility] Circle of Care:  336-271-5888  [facility]:  336-294-1349  [facility]:  336-542-2884  [facility] (walk-ins)  336-676-6840 / [address]  [place]:  800-449-1287  Alcoholics Anonymous:  336-854-4278  Narcotics Anonymous:  800-365-1036  Quit Smoking Hotline:  800-QUIT-NOW (800-784-8669)   Parenting Children's Home Society:  800-632-1400  : Education Center & Support Groups:  336-832-6682  YWCA: 336-273-3461  UNCG: Bringing Out the Best:  336-334-3120               Thriving at Three (Hispanic families): 336-256-1066  Healthy Start ([facility] [place]):  336-387-6161 x2288  Parents as Teachers:  336-691-0024  [place] Child Development- Learning Together (Immigrants): 336-369-5001   Poison Control 800-222-1222  Sports & Recreation [employer] Open Doors Application: ymcanwnc.org/join/open-doors-financial-assistance/  City of [place] Recreation Centers: http://www.[place].gov/index.aspx?page=3615   Special Needs Family Support Network:  336-832-6507  Autism Society of [place]:   336-333-0197 x1402 or x1412 /  800-785-1035  TEACCH :  336-334-5773     ARC of [place]:  336-373-1076  Children's Developmental Service Agency (CDSA):  336-334-5601  CC4C (Care Coordination for Children):  336-641-7641   Transportation Medicaid Transportation: 336-641-4848 to apply   Transit Authority: 336-335-6499 (reduced-fare bus ID to Medicaid/ Medicare/ Orange Card)  SCAT Paratransit services: Eligible riders only, call 336-333-6589 for application   Tutoring/Mentoring Black Child Development Institute: 336-230-2138  Big Brothers/ Big Sisters: 336-378-9100 ([place])  336-882-4167 ([place])  ACES through child's school: 336-370-2321  [employer] Achievers: contact your local Y  SHIELD Mentor Program: 336-337-2771   

## 2017-11-28 LAB — CBC WITH DIFFERENTIAL/PLATELET
BASOS: 0 %
Basophils Absolute: 0 10*3/uL (ref 0.0–0.2)
EOS (ABSOLUTE): 0.1 10*3/uL (ref 0.0–0.4)
EOS: 1 %
Hematocrit: 39.7 % (ref 37.5–51.0)
Hemoglobin: 12.3 g/dL — ABNORMAL LOW (ref 13.0–17.7)
IMMATURE GRANULOCYTES: 1 %
Immature Grans (Abs): 0.1 10*3/uL (ref 0.0–0.1)
LYMPHS ABS: 2.3 10*3/uL (ref 0.7–3.1)
Lymphs: 33 %
MCH: 24.2 pg — AB (ref 26.6–33.0)
MCHC: 31 g/dL — ABNORMAL LOW (ref 31.5–35.7)
MCV: 78 fL — AB (ref 79–97)
MONOS ABS: 0.8 10*3/uL (ref 0.1–0.9)
Monocytes: 12 %
NEUTROS ABS: 3.5 10*3/uL (ref 1.4–7.0)
Neutrophils: 53 %
PLATELETS: 289 10*3/uL (ref 150–450)
RBC: 5.09 x10E6/uL (ref 4.14–5.80)
RDW: 16.5 % — AB (ref 12.3–15.4)
WBC: 6.8 10*3/uL (ref 3.4–10.8)

## 2017-11-28 LAB — LIPID PANEL
CHOLESTEROL TOTAL: 244 mg/dL — AB (ref 100–199)
Chol/HDL Ratio: 5.3 ratio — ABNORMAL HIGH (ref 0.0–5.0)
HDL: 46 mg/dL (ref >39)
LDL CALC: 179 mg/dL — AB (ref 0–99)
TRIGLYCERIDES: 93 mg/dL (ref 0–149)
VLDL CHOLESTEROL CAL: 19 mg/dL (ref 5–40)

## 2017-11-30 ENCOUNTER — Other Ambulatory Visit (INDEPENDENT_AMBULATORY_CARE_PROVIDER_SITE_OTHER): Payer: Self-pay | Admitting: Physician Assistant

## 2017-11-30 ENCOUNTER — Ambulatory Visit: Payer: Self-pay | Attending: Physician Assistant

## 2017-11-30 DIAGNOSIS — E7841 Elevated Lipoprotein(a): Secondary | ICD-10-CM

## 2017-11-30 DIAGNOSIS — I10 Essential (primary) hypertension: Secondary | ICD-10-CM

## 2017-11-30 MED ORDER — ASPIRIN 81 MG PO TBEC: 81.0000 mg | DELAYED_RELEASE_TABLET | Freq: Every day | ORAL | 12 refills | Status: DC

## 2017-11-30 MED ORDER — ATORVASTATIN CALCIUM 40 MG PO TABS: 40.0000 mg | ORAL_TABLET | Freq: Every day | ORAL | 3 refills | Status: DC

## 2017-12-01 ENCOUNTER — Other Ambulatory Visit (INDEPENDENT_AMBULATORY_CARE_PROVIDER_SITE_OTHER): Payer: Self-pay | Admitting: Physician Assistant

## 2017-12-01 ENCOUNTER — Telehealth (INDEPENDENT_AMBULATORY_CARE_PROVIDER_SITE_OTHER): Payer: Self-pay

## 2017-12-01 DIAGNOSIS — I10 Essential (primary) hypertension: Secondary | ICD-10-CM

## 2017-12-01 DIAGNOSIS — E7841 Elevated Lipoprotein(a): Secondary | ICD-10-CM

## 2017-12-01 MED ORDER — ATORVASTATIN CALCIUM 40 MG PO TABS
40.0000 mg | ORAL_TABLET | Freq: Every day | ORAL | 3 refills | Status: DC
Start: 2017-12-01 — End: 2018-01-28

## 2017-12-01 MED ORDER — ASPIRIN 81 MG PO TBEC: 81.0000 mg | DELAYED_RELEASE_TABLET | Freq: Every day | ORAL | 12 refills | Status: DC

## 2017-12-01 MED FILL — ATORVASTATIN CALCIUM 40 MG: 40 | 30 days supply | Qty: 30 | Fill #0

## 2017-12-01 NOTE — Telephone Encounter (Signed)
-----   Message from Clent Demark, PA-C sent at 11/30/2017  8:46 AM EDT ----- Elevated "bad cholesterol". I have sent atorvastatin and aspirin to [facility] on [address]. Avoid fatty/fried foods.

## 2017-12-01 NOTE — Telephone Encounter (Signed)
Ok, meds resent to [facility].

## 2017-12-01 NOTE — Telephone Encounter (Signed)
[name] [name] is aware. Nat Christen, CMA

## 2017-12-01 NOTE — Telephone Encounter (Signed)
Patient was unavailable to talk. Results left with pastor Elgie Congo. He will inform patient. However; atorvastatin and aspirin need to be sent to [place], they were sent to [facility]. Nat Christen, CMA

## 2017-12-28 ENCOUNTER — Encounter (INDEPENDENT_AMBULATORY_CARE_PROVIDER_SITE_OTHER): Payer: Self-pay | Admitting: Physician Assistant

## 2017-12-28 ENCOUNTER — Other Ambulatory Visit: Payer: Self-pay

## 2017-12-28 ENCOUNTER — Ambulatory Visit (INDEPENDENT_AMBULATORY_CARE_PROVIDER_SITE_OTHER): Payer: Self-pay | Admitting: Physician Assistant

## 2017-12-28 VITALS — BP 145/76 | HR 91 | Temp 97.8°F | Ht 62.0 in | Wt 136.0 lb

## 2017-12-28 DIAGNOSIS — F17200 Nicotine dependence, unspecified, uncomplicated: Secondary | ICD-10-CM

## 2017-12-28 DIAGNOSIS — I10 Essential (primary) hypertension: Secondary | ICD-10-CM

## 2017-12-28 DIAGNOSIS — Z1211 Encounter for screening for malignant neoplasm of colon: Secondary | ICD-10-CM

## 2017-12-28 MED ORDER — NICOTINE 21 MG/24HR TD PT24: 21.0000 mg | MEDICATED_PATCH | Freq: Every day | TRANSDERMAL | 1 refills | Status: DC

## 2017-12-28 MED ORDER — AMLODIPINE BESYLATE 10 MG PO TABS: 10.0000 mg | ORAL_TABLET | Freq: Every day | ORAL | 1 refills | Status: DC

## 2017-12-28 NOTE — Patient Instructions (Signed)

## 2017-12-28 NOTE — Progress Notes (Signed)
Subjective:  Patient ID: [name] [name], male    DOB: January 28, 1956  Age: 62 y.o. MRN: [id number]  CC: tobacco and HTN  HPI   [name] [name] a 62 y.o.malewith a medical history of COPD, tobacco use disorder, ETOH abuse, bullous emphysema, and CAP presents to f/u on HTN and tobacco use disorder. Using Nicotine patch at 21 mg/24 hr. Down to 2-3 cigarettes per day. Generally feeling well and denies CP, palpitations, SOB, HA, abdominal pain, f/c/n/v, swelling, rash, or GI/GU sxs.    Outpatient Medications Prior to Visit  Medication Sig Dispense Refill  . aspirin 81 MG EC tablet Take 1 tablet (81 mg total) by mouth daily. Swallow whole. 30 tablet 12  . atorvastatin (LIPITOR) 40 MG tablet Take 1 tablet (40 mg total) by mouth daily. 90 tablet 3  . budesonide-formoterol (SYMBICORT) 80-4.5 MCG/ACT inhaler Inhale 2 puffs into the lungs 2 (two) times daily. 1 Inhaler 11  . hydrochlorothiazide (HYDRODIURIL) 25 MG tablet Take 1 tablet (25 mg total) by mouth daily. Take on tablet in the morning. 30 tablet 11  . albuterol (PROVENTIL HFA;VENTOLIN HFA) 108 (90 Base) MCG/ACT inhaler Inhale 2 puffs into the lungs every 4 (four) hours as needed for wheezing or shortness of breath. 1 Inhaler 11  . cetirizine (ZYRTEC) 10 MG tablet Take 1 tablet (10 mg total) by mouth daily. (Patient not taking: Reported on 12/28/2017) 30 tablet 5  . naproxen (NAPROSYN) 500 MG tablet Take 1 tablet (500 mg total) by mouth 2 (two) times daily with a meal. (Patient not taking: Reported on 12/28/2017) 14 tablet 1  . nicotine (NICODERM CQ - DOSED IN MG/24 HOURS) 21 mg/24hr patch Place 1 patch (21 mg total) onto the skin daily. 28 patch 0   No facility-administered medications prior to visit.      ROS Review of Systems  Constitutional: Negative for chills, fever and malaise/fatigue.  Eyes: Negative for blurred vision.  Respiratory: Negative for shortness of breath.   Cardiovascular: Negative for chest pain and palpitations.   Gastrointestinal: Negative for abdominal pain and nausea.  Genitourinary: Negative for dysuria and hematuria.  Musculoskeletal: Negative for joint pain and myalgias.  Skin: Negative for rash.  Neurological: Negative for tingling and headaches.  Psychiatric/Behavioral: Negative for depression. The patient is not nervous/anxious.     Objective:  BP (!) 145/76 (BP Location: Left Arm, Patient Position: Sitting, Cuff Size: Normal)   Pulse 91   Temp 97.8 F (36.6 C) (Oral)   Ht 5\' 2"  (1.575 m)   Wt 136 lb (61.7 kg)   SpO2 95%   BMI 24.87 kg/m   BP/Weight 12/28/2017 01/10/538 11/22/7339  Systolic BP 937 902 409  Diastolic BP 76 83 79  Wt. (Lbs) 136 140.2 136.4  BMI 24.87 25.64 24.95      Physical Exam  Constitutional: He is oriented to person, place, and time.  Well developed, well nourished, NAD, polite  HENT:  Head: Normocephalic and atraumatic.  Eyes: No scleral icterus.  Neck: Normal range of motion. Neck supple. No thyromegaly present.  Cardiovascular: Normal rate, regular rhythm and normal heart sounds.  No LE edema bilaterally.  Pulmonary/Chest: Effort normal and breath sounds normal.  Musculoskeletal: He exhibits no edema.  Neurological: He is alert and oriented to person, place, and time.  Skin: Skin is warm and dry. No rash noted. No erythema. No pallor.  Psychiatric: He has a normal mood and affect. His behavior is normal. Thought content normal.  Vitals reviewed.    Assessment &  Plan:    1. Hypertension, unspecified type - Begin amLODipine (NORVASC) 10 MG tablet; Take 1 tablet (10 mg total) by mouth daily.  Dispense: 90 tablet; Refill: 1 - Continue HCTZ 25 mg qam  2. Tobacco use disorder - Refill nicotine (NICODERM CQ - DOSED IN MG/24 HOURS) 21 mg/24hr patch; Place 1 patch (21 mg total) onto the skin daily.  Dispense: 28 patch; Refill: 1  3. Special screening for malignant neoplasms, colon - Fecal occult blood, imunochemical   Meds ordered this  encounter  Medications  . amLODipine (NORVASC) 10 MG tablet    Sig: Take 1 tablet (10 mg total) by mouth daily.    Dispense:  90 tablet    Refill:  1    Order Specific Question:   Supervising Provider    Answer:   Charlott Rakes [[id number]]  . nicotine (NICODERM CQ - DOSED IN MG/24 HOURS) 21 mg/24hr patch    Sig: Place 1 patch (21 mg total) onto the skin daily.    Dispense:  28 patch    Refill:  1    Order Specific Question:   Supervising Provider    Answer:   Charlott Rakes [[id number]]    Follow-up: Return in about 4 weeks (around 01/25/2018) for HTN and tobacco cessation.   Clent Demark PA

## 2018-01-28 ENCOUNTER — Other Ambulatory Visit: Payer: Self-pay

## 2018-01-28 ENCOUNTER — Encounter (INDEPENDENT_AMBULATORY_CARE_PROVIDER_SITE_OTHER): Payer: Self-pay | Admitting: Physician Assistant

## 2018-01-28 ENCOUNTER — Ambulatory Visit (INDEPENDENT_AMBULATORY_CARE_PROVIDER_SITE_OTHER): Payer: Self-pay | Admitting: Physician Assistant

## 2018-01-28 DIAGNOSIS — E7841 Elevated Lipoprotein(a): Secondary | ICD-10-CM

## 2018-01-28 DIAGNOSIS — I10 Essential (primary) hypertension: Secondary | ICD-10-CM

## 2018-01-28 MED ORDER — HYDROCHLOROTHIAZIDE 12.5 MG PO TABS: 12.5000 mg | ORAL_TABLET | Freq: Every day | ORAL | 6 refills | Status: DC

## 2018-01-28 MED ORDER — SIMVASTATIN 40 MG PO TABS: 40.0000 mg | ORAL_TABLET | Freq: Every day | ORAL | 6 refills | Status: DC

## 2018-01-28 MED ORDER — LOSARTAN POTASSIUM 50 MG PO TABS: 50.0000 mg | ORAL_TABLET | Freq: Every day | ORAL | 6 refills | Status: DC

## 2018-01-28 MED ORDER — ASPIRIN 81 MG PO TBEC: 81.0000 mg | DELAYED_RELEASE_TABLET | Freq: Every day | ORAL | 11 refills | Status: DC

## 2018-01-28 NOTE — Progress Notes (Signed)
Subjective:  Patient ID: [name] [name], male    DOB: 12-08-1955  Age: 62 y.o. MRN: [id number]  CC: f/u HTN and tobacco  HPI  [name] [name] a 62 y.o.malewith a medical history of COPD, tobacco use disorder, ETOH abuse, bullous emphysema, and CAP presents to f/u on HTN. Says he was unable to afford his antihypertensives and nicotine patch at [place]. Total was $28. 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 Denies CP, palpitations, SOB, HA, tingling, numbness, abdominal pain, f/c/n/v, LE edema, rash, or GI/GU sxs.     Outpatient Medications Prior to Visit  Medication Sig Dispense Refill  . budesonide-formoterol (SYMBICORT) 80-4.5 MCG/ACT inhaler Inhale 2 puffs into the lungs 2 (two) times daily. 1 Inhaler 11  . albuterol (PROVENTIL HFA;VENTOLIN HFA) 108 (90 Base) MCG/ACT inhaler Inhale 2 puffs into the lungs every 4 (four) hours as needed for wheezing or shortness of breath. 1 Inhaler 11  . amLODipine (NORVASC) 10 MG tablet Take 1 tablet (10 mg total) by mouth daily. (Patient not taking: Reported on 01/28/2018) 90 tablet 1  . aspirin 81 MG EC tablet Take 1 tablet (81 mg total) by mouth daily. Swallow whole. (Patient not taking: Reported on 01/28/2018) 30 tablet 12  . atorvastatin (LIPITOR) 40 MG tablet Take 1 tablet (40 mg total) by mouth daily. (Patient not taking: Reported on 01/28/2018) 90 tablet 3  . hydrochlorothiazide (HYDRODIURIL) 25 MG tablet Take 1 tablet (25 mg total) by mouth daily. Take on tablet in the morning. (Patient not taking: Reported on 01/28/2018) 30 tablet 11  . nicotine (NICODERM CQ - DOSED IN MG/24 HOURS) 21 mg/24hr patch Place 1 patch (21 mg total) onto the skin daily. (Patient not taking: Reported on 01/28/2018) 28 patch 1   No facility-administered medications prior to visit.      ROS Review of Systems  Constitutional: Negative for chills, fever and malaise/fatigue.  Eyes: Negative for blurred vision.  Respiratory: Negative for  shortness of breath.   Cardiovascular: Negative for chest pain and palpitations.  Gastrointestinal: Negative for abdominal pain and nausea.  Genitourinary: Negative for dysuria and hematuria.  Musculoskeletal: Negative for joint pain and myalgias.  Skin: Negative for rash.  Neurological: Negative for tingling and headaches.  Psychiatric/Behavioral: Negative for depression. The patient is not nervous/anxious.     Objective:  BP 139/74 (BP Location: Left Arm, Patient Position: Sitting, Cuff Size: Normal)   Pulse 85   Temp 97.9 F (36.6 C) (Oral)   Ht 5\' 2"  (1.575 m)   Wt 137 lb (62.1 kg)   SpO2 97%   BMI 25.06 kg/m   BP/Weight 01/28/2018 12/28/2017 05/27/3233  Systolic BP 573 220 254  Diastolic BP 74 76 83  Wt. (Lbs) 137 136 140.2  BMI 25.06 24.87 25.64      Physical Exam  Constitutional: He is oriented to person, place, and time.  Well developed, well nourished, NAD, polite  HENT:  Head: Normocephalic and atraumatic.  Eyes: No scleral icterus.  Neck: Normal range of motion. Neck supple. No thyromegaly present.  Cardiovascular: Normal rate, regular rhythm and normal heart sounds.  Pulmonary/Chest: Effort normal and breath sounds normal.  Musculoskeletal: He exhibits no edema.  Neurological: He is alert and oriented to person, place, and time.  Skin: Skin is warm and dry. No rash noted. No erythema. No pallor.  Psychiatric: He has a normal mood and affect. His behavior is normal. Thought content normal.  Vitals reviewed.    Assessment & Plan:  1. Hypertension, unspecified type - Begin (HYDRODIURIL) 12.5 MG tablet; Take 1 tablet (12.5 mg total) by mouth daily.  Dispense: 30 tablet; Refill: 6 - Begin aspirin 81 MG EC tablet; Take 1 tablet (81 mg total) by mouth daily.  Dispense: 30 tablet; Refill: 11 - Begin losartan (COZAAR) 50 MG tablet; Take 1 tablet (50 mg total) by mouth daily.  Dispense: 30 tablet; Refill: 6  2. Elevated lipoprotein(a) - Begin simvastatin (ZOCOR)  40 MG tablet; Take 1 tablet (40 mg total) by mouth at bedtime.  Dispense: 30 tablet; Refill: 6  * Antihypertensives and simvastatin confirmed to be on the [facility] $4 list.     Meds ordered this encounter  Medications  . simvastatin (ZOCOR) 40 MG tablet    Sig: Take 1 tablet (40 mg total) by mouth at bedtime.    Dispense:  30 tablet    Refill:  6    Order Specific Question:   Supervising Provider    Answer:   Charlott Rakes [[id number]]  . hydrochlorothiazide (HYDRODIURIL) 12.5 MG tablet    Sig: Take 1 tablet (12.5 mg total) by mouth daily.    Dispense:  30 tablet    Refill:  6    Order Specific Question:   Supervising Provider    Answer:   Charlott Rakes [[id number]]  . aspirin 81 MG EC tablet    Sig: Take 1 tablet (81 mg total) by mouth daily.    Dispense:  30 tablet    Refill:  11    Order Specific Question:   Supervising Provider    Answer:   Charlott Rakes [[id number]]  . losartan (COZAAR) 50 MG tablet    Sig: Take 1 tablet (50 mg total) by mouth daily.    Dispense:  30 tablet    Refill:  6    Order Specific Question:   Supervising Provider    Answer:   Charlott Rakes [[id number]]    Follow-up: Return in about 4 weeks (around 02/25/2018) for HTN.   Clent Demark PA

## 2018-01-28 NOTE — Patient Instructions (Signed)
DASH Eating Plan DASH stands for "Dietary Approaches to Stop Hypertension." The DASH eating plan is a healthy eating plan that has been shown to reduce high blood pressure (hypertension). It may also reduce your risk for type 2 diabetes, heart disease, and stroke. The DASH eating plan may also help with weight loss. What are tips for following this plan? General guidelines  Avoid eating more than 2,300 mg (milligrams) of salt (sodium) a day. If you have hypertension, you may need to reduce your sodium intake to 1,500 mg a day.  Limit alcohol intake to no more than 1 drink a day for nonpregnant women and 2 drinks a day for men. One drink equals 12 oz of beer, 5 oz of wine, or 1 oz of hard liquor.  Work with your health care provider to maintain a healthy body weight or to lose weight. Ask what an ideal weight is for you.  Get at least 30 minutes of exercise that causes your heart to beat faster (aerobic exercise) most days of the week. Activities may include walking, swimming, or biking.  Work with your health care provider or diet and nutrition specialist (dietitian) to adjust your eating plan to your individual calorie needs. Reading food labels  Check food labels for the amount of sodium per serving. Choose foods with less than 5 percent of the Daily Value of sodium. Generally, foods with less than 300 mg of sodium per serving fit into this eating plan.  To find whole grains, look for the word "whole" as the first word in the ingredient list. Shopping  Buy products labeled as "low-sodium" or "no salt added."  Buy fresh foods. Avoid canned foods and premade or frozen meals. Cooking  Avoid adding salt when cooking. Use salt-free seasonings or herbs instead of table salt or sea salt. Check with your health care provider or pharmacist before using salt substitutes.  Do not fry foods. Cook foods using healthy methods such as baking, boiling, grilling, and broiling instead.  Cook with  heart-healthy oils, such as olive, canola, soybean, or sunflower oil. Meal planning   Eat a balanced diet that includes: ? 5 or more servings of fruits and vegetables each day. At each meal, try to fill half of your plate with fruits and vegetables. ? Up to 6-8 servings of whole grains each day. ? Less than 6 oz of lean meat, poultry, or fish each day. A 3-oz serving of meat is about the same size as a deck of cards. One egg equals 1 oz. ? 2 servings of low-fat dairy each day. ? A serving of nuts, seeds, or beans 5 times each week. ? Heart-healthy fats. Healthy fats called Omega-3 fatty acids are found in foods such as flaxseeds and coldwater fish, like sardines, salmon, and mackerel.  Limit how much you eat of the following: ? Canned or prepackaged foods. ? Food that is high in trans fat, such as fried foods. ? Food that is high in saturated fat, such as fatty meat. ? Sweets, desserts, sugary drinks, and other foods with added sugar. ? Full-fat dairy products.  Do not salt foods before eating.  Try to eat at least 2 vegetarian meals each week.  Eat more home-cooked food and less restaurant, buffet, and fast food.  When eating at a restaurant, ask that your food be prepared with less salt or no salt, if possible. What foods are recommended? The items listed may not be a complete list. Talk with your dietitian about what   dietary choices are best for you. Grains Whole-grain or whole-wheat bread. Whole-grain or whole-wheat pasta. Brown rice. Oatmeal. Quinoa. Bulgur. Whole-grain and low-sodium cereals. Pita bread. Low-fat, low-sodium crackers. Whole-wheat flour tortillas. Vegetables Fresh or frozen vegetables (raw, steamed, roasted, or grilled). Low-sodium or reduced-sodium tomato and vegetable juice. Low-sodium or reduced-sodium tomato sauce and tomato paste. Low-sodium or reduced-sodium canned vegetables. Fruits All fresh, dried, or frozen fruit. Canned fruit in natural juice (without  added sugar). Meat and other protein foods Skinless chicken or turkey. Ground chicken or turkey. Pork with fat trimmed off. Fish and seafood. Egg whites. Dried beans, peas, or lentils. Unsalted nuts, nut butters, and seeds. Unsalted canned beans. Lean cuts of beef with fat trimmed off. Low-sodium, lean deli meat. Dairy Low-fat (1%) or fat-free (skim) milk. Fat-free, low-fat, or reduced-fat cheeses. Nonfat, low-sodium ricotta or cottage cheese. Low-fat or nonfat yogurt. Low-fat, low-sodium cheese. Fats and oils Soft margarine without trans fats. Vegetable oil. Low-fat, reduced-fat, or light mayonnaise and salad dressings (reduced-sodium). Canola, safflower, olive, soybean, and sunflower oils. Avocado. Seasoning and other foods Herbs. Spices. Seasoning mixes without salt. Unsalted popcorn and pretzels. Fat-free sweets. What foods are not recommended? The items listed may not be a complete list. Talk with your dietitian about what dietary choices are best for you. Grains Baked goods made with fat, such as croissants, muffins, or some breads. Dry pasta or rice meal packs. Vegetables Creamed or fried vegetables. Vegetables in a cheese sauce. Regular canned vegetables (not low-sodium or reduced-sodium). Regular canned tomato sauce and paste (not low-sodium or reduced-sodium). Regular tomato and vegetable juice (not low-sodium or reduced-sodium). Pickles. Olives. Fruits Canned fruit in a light or heavy syrup. Fried fruit. Fruit in cream or butter sauce. Meat and other protein foods Fatty cuts of meat. Ribs. Fried meat. Bacon. Sausage. Bologna and other processed lunch meats. Salami. Fatback. Hotdogs. Bratwurst. Salted nuts and seeds. Canned beans with added salt. Canned or smoked fish. Whole eggs or egg yolks. Chicken or turkey with skin. Dairy Whole or 2% milk, cream, and half-and-half. Whole or full-fat cream cheese. Whole-fat or sweetened yogurt. Full-fat cheese. Nondairy creamers. Whipped toppings.  Processed cheese and cheese spreads. Fats and oils Butter. Stick margarine. Lard. Shortening. Ghee. Bacon fat. Tropical oils, such as coconut, palm kernel, or palm oil. Seasoning and other foods Salted popcorn and pretzels. Onion salt, garlic salt, seasoned salt, table salt, and sea salt. Worcestershire sauce. Tartar sauce. Barbecue sauce. Teriyaki sauce. Soy sauce, including reduced-sodium. Steak sauce. Canned and packaged gravies. Fish sauce. Oyster sauce. Cocktail sauce. Horseradish that you find on the shelf. Ketchup. Mustard. Meat flavorings and tenderizers. Bouillon cubes. Hot sauce and Tabasco sauce. Premade or packaged marinades. Premade or packaged taco seasonings. Relishes. Regular salad dressings. Where to find more information:  National Heart, Lung, and Blood Institute: www.nhlbi.nih.gov  American Heart Association: www.heart.org Summary  The DASH eating plan is a healthy eating plan that has been shown to reduce high blood pressure (hypertension). It may also reduce your risk for type 2 diabetes, heart disease, and stroke.  With the DASH eating plan, you should limit salt (sodium) intake to 2,300 mg a day. If you have hypertension, you may need to reduce your sodium intake to 1,500 mg a day.  When on the DASH eating plan, aim to eat more fresh fruits and vegetables, whole grains, lean proteins, low-fat dairy, and heart-healthy fats.  Work with your health care provider or diet and nutrition specialist (dietitian) to adjust your eating plan to your individual   calorie needs. This information is not intended to replace advice given to you by your health care provider. Make sure you discuss any questions you have with your health care provider. Document Released: 04/24/2011 Document Revised: 04/28/2016 Document Reviewed: 04/28/2016 Elsevier Interactive Patient Education  2018 Elsevier Inc.  

## 2018-01-29 LAB — FECAL OCCULT BLOOD, IMMUNOCHEMICAL: FECAL OCCULT BLD: NEGATIVE

## 2018-02-01 ENCOUNTER — Telehealth (INDEPENDENT_AMBULATORY_CARE_PROVIDER_SITE_OTHER): Payer: Self-pay

## 2018-02-01 NOTE — Telephone Encounter (Signed)
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 Explained that means that no blood was found in patients stool. Mr. [name] [name] will inform patient. Nat Christen, CMA

## 2018-02-01 NOTE — Telephone Encounter (Signed)
-----   Message from Clent Demark, PA-C sent at 02/01/2018 10:17 AM EDT ----- FIT negative.

## 2018-02-25 ENCOUNTER — Encounter (INDEPENDENT_AMBULATORY_CARE_PROVIDER_SITE_OTHER): Payer: Self-pay | Admitting: Physician Assistant

## 2018-02-25 ENCOUNTER — Ambulatory Visit (INDEPENDENT_AMBULATORY_CARE_PROVIDER_SITE_OTHER): Payer: Self-pay | Admitting: Physician Assistant

## 2018-02-25 ENCOUNTER — Other Ambulatory Visit: Payer: Self-pay

## 2018-02-25 VITALS — BP 130/72 | HR 90 | Temp 97.4°F | Ht 62.0 in | Wt 140.2 lb

## 2018-02-25 DIAGNOSIS — F17200 Nicotine dependence, unspecified, uncomplicated: Secondary | ICD-10-CM

## 2018-02-25 DIAGNOSIS — I10 Essential (primary) hypertension: Secondary | ICD-10-CM

## 2018-02-25 MED ORDER — NICOTINE 14 MG/24HR TD PT24: 14.0000 mg | MEDICATED_PATCH | Freq: Every day | TRANSDERMAL | 1 refills | Status: DC

## 2018-02-25 NOTE — Patient Instructions (Signed)

## 2018-02-25 NOTE — Progress Notes (Signed)
Subjective:  Patient ID: [name] [name], male    DOB: 08/07/1955  Age: 62 y.o. MRN: [id number]  CC: f/u HTN and tobacco use  HPI [name] [name] a 62 y.o.malewith a medical history of COPD, tobacco use disorder, ETOH abuse, bullous emphysema, and CAP presents to f/u on HTN and tobacco use disorder. Last BP 139/74 mmHg one month ago. Taking HCTZ and Losartan as directed and without side effects. BP 130/72 mmHg today in clinic.     Pt using nicotine patches and is smoking approximately three cigarettes per day which is down from 10 cigarettes per day. Does not endorse CP, palpitations, SOB, wheezing, HA, tingling, numbness, abdominal pain, f/c/n/v, swelling, rash, or GI/GU sxs.     Outpatient Medications Prior to Visit  Medication Sig Dispense Refill  . aspirin 81 MG EC tablet Take 1 tablet (81 mg total) by mouth daily. 30 tablet 11  . budesonide-formoterol (SYMBICORT) 80-4.5 MCG/ACT inhaler Inhale 2 puffs into the lungs 2 (two) times daily. 1 Inhaler 11  . hydrochlorothiazide (HYDRODIURIL) 12.5 MG tablet Take 1 tablet (12.5 mg total) by mouth daily. 30 tablet 6  . losartan (COZAAR) 50 MG tablet Take 1 tablet (50 mg total) by mouth daily. 30 tablet 6  . nicotine (NICODERM CQ - DOSED IN MG/24 HOURS) 21 mg/24hr patch Place 1 patch (21 mg total) onto the skin daily. 28 patch 1  . simvastatin (ZOCOR) 40 MG tablet Take 1 tablet (40 mg total) by mouth at bedtime. 30 tablet 6  . albuterol (PROVENTIL HFA;VENTOLIN HFA) 108 (90 Base) MCG/ACT inhaler Inhale 2 puffs into the lungs every 4 (four) hours as needed for wheezing or shortness of breath. 1 Inhaler 11   No facility-administered medications prior to visit.      ROS Review of Systems  Constitutional: Negative for chills, fever and malaise/fatigue.  Eyes: Negative for blurred vision.  Respiratory: Negative for shortness of breath.   Cardiovascular: Negative for chest pain and palpitations.  Gastrointestinal: Negative for abdominal  pain and nausea.  Genitourinary: Negative for dysuria and hematuria.  Musculoskeletal: Negative for joint pain and myalgias.  Skin: Negative for rash.  Neurological: Negative for tingling and headaches.  Psychiatric/Behavioral: Negative for depression. The patient is not nervous/anxious.     Objective:  BP 130/72 (BP Location: Left Arm, Patient Position: Sitting, Cuff Size: Normal)   Pulse 90   Temp (!) 97.4 F (36.3 C) (Oral)   Ht 5\' 2"  (1.575 m)   Wt 140 lb 3.2 oz (63.6 kg)   SpO2 99%   BMI 25.64 kg/m   BP/Weight 02/25/2018 01/28/2018 0/05/7492  Systolic BP 496 759 163  Diastolic BP 72 74 76  Wt. (Lbs) 140.2 137 136  BMI 25.64 25.06 24.87      Physical Exam  Constitutional: He is oriented to person, place, and time.  Well developed, well nourished, NAD, polite  HENT:  Head: Normocephalic and atraumatic.  Eyes: No scleral icterus.  Neck: Normal range of motion. Neck supple. No thyromegaly present.  Cardiovascular: Normal rate, regular rhythm and normal heart sounds.  Pulmonary/Chest: Effort normal and breath sounds normal.  Abdominal: Soft. Bowel sounds are normal. There is no tenderness.  Musculoskeletal: He exhibits no edema.  Neurological: He is alert and oriented to person, place, and time.  Skin: Skin is warm and dry. No rash noted. No erythema. No pallor.  Psychiatric: He has a normal mood and affect. His behavior is normal. Thought content normal.  Vitals reviewed.    Assessment &  Plan:    1. Hypertension, unspecified type - Good control. Continue HCTZ and Losartan.  2. Tobacco use disorder - Smoking 2-3 cigarettes per day. Down from 10 cigarettes per day three months ago. - Decrease dosage, nicotine (NICODERM CQ - DOSED IN MG/24 HOURS) 14 mg/24hr patch; Place 1 patch (14 mg total) onto the skin daily.  Dispense: 28 patch; Refill: 1   Meds ordered this encounter  Medications  . nicotine (NICODERM CQ - DOSED IN MG/24 HOURS) 14 mg/24hr patch    Sig:  Place 1 patch (14 mg total) onto the skin daily.    Dispense:  28 patch    Refill:  1    Order Specific Question:   Supervising Provider    Answer:   Charlott Rakes [[id number]]    Follow-up: Return in about 4 months (around 06/28/2018) for HTN and tobacco.   Clent Demark PA

## 2018-04-27 ENCOUNTER — Telehealth (INDEPENDENT_AMBULATORY_CARE_PROVIDER_SITE_OTHER): Payer: Self-pay | Admitting: Physician Assistant

## 2018-04-27 ENCOUNTER — Other Ambulatory Visit (INDEPENDENT_AMBULATORY_CARE_PROVIDER_SITE_OTHER): Payer: Self-pay | Admitting: Physician Assistant

## 2018-04-27 DIAGNOSIS — J449 Chronic obstructive pulmonary disease, unspecified: Secondary | ICD-10-CM

## 2018-04-27 DIAGNOSIS — I10 Essential (primary) hypertension: Secondary | ICD-10-CM

## 2018-04-27 DIAGNOSIS — E7841 Elevated Lipoprotein(a): Secondary | ICD-10-CM

## 2018-04-27 DIAGNOSIS — F17200 Nicotine dependence, unspecified, uncomplicated: Secondary | ICD-10-CM

## 2018-04-27 DIAGNOSIS — R05 Cough: Secondary | ICD-10-CM

## 2018-04-27 DIAGNOSIS — R059 Cough, unspecified: Secondary | ICD-10-CM

## 2018-04-27 MED ORDER — ALBUTEROL SULFATE HFA 108 (90 BASE) MCG/ACT IN AERS: 2.0000 | INHALATION_SPRAY | RESPIRATORY_TRACT | 3 refills | Status: DC | PRN

## 2018-04-27 MED ORDER — HYDROCHLOROTHIAZIDE 12.5 MG PO TABS: 12.5000 mg | ORAL_TABLET | Freq: Every day | ORAL | 3 refills | Status: DC

## 2018-04-27 MED ORDER — ASPIRIN 81 MG PO TBEC: 81.0000 mg | DELAYED_RELEASE_TABLET | Freq: Every day | ORAL | 11 refills | Status: DC

## 2018-04-27 MED ORDER — BUDESONIDE-FORMOTEROL FUMARATE 80-4.5 MCG/ACT IN AERO: 2.0000 | INHALATION_SPRAY | Freq: Two times a day (BID) | RESPIRATORY_TRACT | 11 refills | Status: DC

## 2018-04-27 MED ORDER — SIMVASTATIN 40 MG PO TABS: 40.0000 mg | ORAL_TABLET | Freq: Every day | ORAL | 3 refills | Status: DC

## 2018-04-27 MED ORDER — LOSARTAN POTASSIUM 50 MG PO TABS: 50.0000 mg | ORAL_TABLET | Freq: Every day | ORAL | 3 refills | Status: DC

## 2018-04-27 NOTE — Telephone Encounter (Signed)
Meds sent to [facility]. I did not send Nicotine patches because I don't know which strength he is currently on.

## 2018-04-27 NOTE — Telephone Encounter (Signed)
Please send all medications to [facility]. Nat Christen, CMA

## 2018-04-27 NOTE — Telephone Encounter (Signed)
Patient called stating that [facility] went to [facility] to pick up his med refill but was told he had to $22 or $18 for his meds. Patient states that the first couple of time he only had to pay $12 and recently they increased the price to $14, but now they are increasing the price again to $22 or $18. Patient would like to know if PCP can send all his medication to a pharmacy were they will not increase the price.  Please advice [phone]  Thank you Emmit Pomfret

## 2018-04-30 NOTE — Telephone Encounter (Signed)
Pastor bass is aware that medications have been refilled and sent to [facility]. She will have patient call [facility] to report the strength of nicotine patches so that we may refill. Nat Christen, CMA

## 2018-05-04 ENCOUNTER — Telehealth (INDEPENDENT_AMBULATORY_CARE_PROVIDER_SITE_OTHER): Payer: Self-pay

## 2018-05-04 MED FILL — !SYMBICORT 80-4.5 MCG INH: 80-4.5 | 30 days supply | Qty: 1 | Fill #0

## 2018-05-04 MED FILL — ALBUTEROL SULFATE HFA 108 (: 108 (90 BAS | 16 days supply | Qty: 18 | Fill #0

## 2018-05-04 MED FILL — LOSARTAN POTASSIUM 50 MG TA: 50 | 30 days supply | Qty: 30 | Fill #0

## 2018-05-04 MED FILL — HYDROCHLOROTHIAZIDE 12.5 MG: 12.5 | 30 days supply | Qty: 30 | Fill #0

## 2018-05-04 MED FILL — SIMVASTATIN 40 MG TABLET: 40 | 30 days supply | Qty: 30 | Fill #0

## 2018-05-04 NOTE — Telephone Encounter (Signed)
Patient has scheduled for 05/31/2018. Nat Christen, CMA

## 2018-05-04 NOTE — Telephone Encounter (Signed)
Patient's dose was decreased to 14 mg/24 hr patch at the last visit. He should no longer be taking 21 mg/24hr patch. Needs appointment to discuss his smoking.

## 2018-05-04 NOTE — Telephone Encounter (Signed)
Patient needs refill of nicotine patches. Patient uses 21mg . Please send to [facility]. Nat Christen, CMA

## 2018-05-31 ENCOUNTER — Encounter (INDEPENDENT_AMBULATORY_CARE_PROVIDER_SITE_OTHER): Payer: Self-pay | Admitting: Internal Medicine

## 2018-05-31 ENCOUNTER — Ambulatory Visit (INDEPENDENT_AMBULATORY_CARE_PROVIDER_SITE_OTHER): Payer: Self-pay | Admitting: Internal Medicine

## 2018-05-31 ENCOUNTER — Other Ambulatory Visit: Payer: Self-pay

## 2018-05-31 VITALS — BP 114/77 | HR 103 | Temp 97.4°F | Ht 62.0 in | Wt 141.6 lb

## 2018-05-31 DIAGNOSIS — Z23 Encounter for immunization: Secondary | ICD-10-CM

## 2018-05-31 DIAGNOSIS — Z1331 Encounter for screening for depression: Secondary | ICD-10-CM

## 2018-05-31 DIAGNOSIS — Z131 Encounter for screening for diabetes mellitus: Secondary | ICD-10-CM

## 2018-05-31 DIAGNOSIS — F17210 Nicotine dependence, cigarettes, uncomplicated: Secondary | ICD-10-CM

## 2018-05-31 DIAGNOSIS — F17200 Nicotine dependence, unspecified, uncomplicated: Secondary | ICD-10-CM

## 2018-05-31 LAB — POCT GLYCOSYLATED HEMOGLOBIN (HGB A1C): Hemoglobin A1C: 5.6 % (ref 4.0–5.6)

## 2018-05-31 MED ORDER — NICOTINE 7 MG/24HR TD PT24: 7.0000 mg | MEDICATED_PATCH | Freq: Every day | TRANSDERMAL | 0 refills | Status: DC

## 2018-05-31 MED ORDER — NICOTINE 14 MG/24HR TD PT24: 14.0000 mg | MEDICATED_PATCH | Freq: Every day | TRANSDERMAL | 1 refills | Status: DC

## 2018-05-31 NOTE — Progress Notes (Signed)
Patient ID: [name] [name], male    DOB: Jun 15, 1955  MRN: [id number]  CC: Nicotine Dependence (needs nicotene patches)   Subjective: [name] [name] is a 63 y.o. male who presents for UC visit His concerns today include:  Patient with history of HTN, tobacco dependence, HL, COPD, EtOH abuse  Pt had question about the nicotine patches. He wants a RF but not sure which strength to get.  On last visit he was down to 2-3 cig/day. He was prescribed the 14 mg patches. Once he ran out of the 14 mg patches, he started smoking 6 cig a day.   Pos depression screen noted today:  I asked pt whether he feels down or depressed, pt said he does not think so.    Patient Active Problem List   Diagnosis Date Noted  . CIGARETTE SMOKER 11/02/2007  . COPD UNSPECIFIED 11/02/2007  . ANEMIA, IRON DEFICIENCY, MICROCYTIC 08/23/2007  . PULMONARY INFILTRATE INCLUDES (EOSINOPHILIA) 08/23/2007     Current Outpatient Medications on File Prior to Visit  Medication Sig Dispense Refill  . aspirin 81 MG EC tablet Take 1 tablet (81 mg total) by mouth daily. 30 tablet 11  . budesonide-formoterol (SYMBICORT) 80-4.5 MCG/ACT inhaler Inhale 2 puffs into the lungs 2 (two) times daily. 1 Inhaler 11  . hydrochlorothiazide (HYDRODIURIL) 12.5 MG tablet Take 1 tablet (12.5 mg total) by mouth daily. 30 tablet 3  . simvastatin (ZOCOR) 40 MG tablet Take 1 tablet (40 mg total) by mouth at bedtime. 30 tablet 3  . albuterol (PROVENTIL HFA;VENTOLIN HFA) 108 (90 Base) MCG/ACT inhaler Inhale 2 puffs into the lungs every 4 (four) hours as needed for wheezing or shortness of breath. 1 Inhaler 3  . losartan (COZAAR) 50 MG tablet Take 1 tablet (50 mg total) by mouth daily. 30 tablet 3   No current facility-administered medications on file prior to visit.     No Known Allergies  Social History   Socioeconomic History  . Marital status: Single    Spouse name: Not on file  . Number of children: Not on file  . Years of education:  Not on file  . Highest education level: Not on file  Occupational History  . Not on file  Social Needs  . Financial resource strain: Not on file  . Food insecurity:    Worry: Not on file    Inability: Not on file  . Transportation needs:    Medical: Not on file    Non-medical: Not on file  Tobacco Use  . Smoking status: Current Every Day Smoker    Types: Cigarettes  . Smokeless tobacco: Never Used  Substance and Sexual Activity  . Alcohol use: Not Currently  . Drug use: Never  . Sexual activity: Not Currently  Lifestyle  . Physical activity:    Days per week: Not on file    Minutes per session: Not on file  . Stress: Not on file  Relationships  . Social connections:    Talks on phone: Not on file    Gets together: Not on file    Attends religious service: Not on file    Active member of club or organization: Not on file    Attends meetings of clubs or organizations: Not on file    Relationship status: Not on file  . Intimate partner violence:    Fear of current or ex partner: Not on file    Emotionally abused: Not on file    Physically abused: Not on file  Forced sexual activity: Not on file  Other Topics Concern  . Not on file  Social History Narrative  . Not on file    No family history on file.  No past surgical history on file.  ROS: Review of Systems Neg except as above PHYSICAL EXAM: BP 114/77 (BP Location: Left Arm, Patient Position: Sitting, Cuff Size: Normal)   Pulse (!) 103   Temp (!) 97.4 F (36.3 C) (Oral)   Ht 5\' 2"  (1.575 m)   Wt 141 lb 9.6 oz (64.2 kg)   SpO2 100%   BMI 25.90 kg/m   Physical Exam General appearance - alert, well appearing, and in no distress Mental status - pt poor historian  A1C 5.6 Depression screen [facility] 2/9 05/31/2018 02/25/2018 01/28/2018  Decreased Interest 2 0 2  Down, Depressed, Hopeless 0 0 0  PHQ - 2 Score 2 0 2  Altered sleeping 0 2 2  Tired, decreased energy 2 2 1   Change in appetite 1 2 2   Feeling bad  or failure about yourself  0 0 1  Trouble concentrating 2 0 2  Moving slowly or fidgety/restless 3 2 2   Suicidal thoughts 0 0 0  PHQ-9 Score 10 8 12     ASSESSMENT AND PLAN:  1. Tobacco use disorder We agreed to restart the 14 mg patches and have him use daily for 1-2 mths then step down to the 7 mg patches - nicotine (NICODERM CQ - DOSED IN MG/24 HOURS) 14 mg/24hr patch; Place 1 patch (14 mg total) onto the skin daily.  Dispense: 28 patch; Refill: 1 - nicotine (NICODERM CQ - DOSED IN MG/24 HR) 7 mg/24hr patch; Place 1 patch (7 mg total) onto the skin daily.  Dispense: 28 patch; Refill: 0  2. Positive depression screening Pt states this is not a major issue for him  3. Screening for diabetes mellitus Screen negative - HgB A1c  4. Need for immunization against influenza - Flu Vaccine QUAD 36+ mos IM   Patient was given the opportunity to ask questions.  Patient verbalized understanding of the plan and was able to repeat key elements of the plan.   Orders Placed This Encounter  Procedures  . Flu Vaccine QUAD 36+ mos IM  . HgB A1c     Requested Prescriptions   Signed Prescriptions Disp Refills  . nicotine (NICODERM CQ - DOSED IN MG/24 HOURS) 14 mg/24hr patch 28 patch 1    Sig: Place 1 patch (14 mg total) onto the skin daily.  . nicotine (NICODERM CQ - DOSED IN MG/24 HR) 7 mg/24hr patch 28 patch 0    Sig: Place 1 patch (7 mg total) onto the skin daily.    Return in about 3 months (around 08/30/2018).  Karle Plumber, MD, FACP

## 2018-05-31 NOTE — Patient Instructions (Addendum)
Start with the 14 mg Nicotine patches and use daily for 2 months.  After completing the 14 mg patches, start the 7 mg patches and use daily for 1 month.

## 2018-06-24 ENCOUNTER — Ambulatory Visit (INDEPENDENT_AMBULATORY_CARE_PROVIDER_SITE_OTHER): Payer: Self-pay | Admitting: Family Medicine

## 2018-06-24 MED FILL — SIMVASTATIN 40 MG TABLET: 40 | 30 days supply | Qty: 30 | Fill #1

## 2018-06-24 MED FILL — LOSARTAN POTASSIUM 50 MG TA: 50 | 30 days supply | Qty: 30 | Fill #1

## 2018-06-24 MED FILL — HYDROCHLOROTHIAZIDE 12.5 MG: 12.5 | 30 days supply | Qty: 30 | Fill #1

## 2018-08-09 MED FILL — SIMVASTATIN 40 MG TABLET: 40 | 30 days supply | Qty: 30 | Fill #2

## 2018-08-09 MED FILL — HYDROCHLOROTHIAZIDE 12.5 MG: 12.5 | 30 days supply | Qty: 30 | Fill #2

## 2018-08-09 MED FILL — LOSARTAN POTASSIUM 50 MG TA: 50 | 30 days supply | Qty: 30 | Fill #2

## 2018-08-16 ENCOUNTER — Ambulatory Visit (INDEPENDENT_AMBULATORY_CARE_PROVIDER_SITE_OTHER): Payer: Self-pay | Admitting: Primary Care

## 2018-09-21 ENCOUNTER — Telehealth: Payer: Self-pay | Admitting: *Deleted

## 2018-09-21 NOTE — Telephone Encounter (Signed)
Medical Assistant left message on patient's home and cell voicemail. Voicemail states to give a call back to [place] with [facility] at [phone]. Patient is aware of visit being via telephone

## 2018-09-22 ENCOUNTER — Other Ambulatory Visit: Payer: Self-pay

## 2018-09-22 ENCOUNTER — Ambulatory Visit: Payer: Self-pay | Attending: Physician Assistant | Admitting: Primary Care

## 2018-09-22 ENCOUNTER — Encounter: Payer: Self-pay | Admitting: Primary Care

## 2018-09-22 DIAGNOSIS — R059 Cough, unspecified: Secondary | ICD-10-CM

## 2018-09-22 DIAGNOSIS — J449 Chronic obstructive pulmonary disease, unspecified: Secondary | ICD-10-CM

## 2018-09-22 DIAGNOSIS — I10 Essential (primary) hypertension: Secondary | ICD-10-CM

## 2018-09-22 DIAGNOSIS — E7841 Elevated Lipoprotein(a): Secondary | ICD-10-CM

## 2018-09-22 DIAGNOSIS — R05 Cough: Secondary | ICD-10-CM

## 2018-09-22 MED ORDER — BUDESONIDE-FORMOTEROL FUMARATE 80-4.5 MCG/ACT IN AERO: 2.0000 | INHALATION_SPRAY | Freq: Two times a day (BID) | RESPIRATORY_TRACT | 11 refills | Status: DC

## 2018-09-22 MED ORDER — HYDROCHLOROTHIAZIDE 12.5 MG PO TABS: 12.5000 mg | ORAL_TABLET | Freq: Every day | ORAL | 3 refills | Status: DC

## 2018-09-22 MED ORDER — LOSARTAN POTASSIUM 50 MG PO TABS: 50.0000 mg | ORAL_TABLET | Freq: Every day | ORAL | 3 refills | Status: DC

## 2018-09-22 MED ORDER — SIMVASTATIN 40 MG PO TABS: 40.0000 mg | ORAL_TABLET | Freq: Every day | ORAL | 3 refills | Status: DC

## 2018-09-22 MED ORDER — ALBUTEROL SULFATE HFA 108 (90 BASE) MCG/ACT IN AERS: 2.0000 | INHALATION_SPRAY | RESPIRATORY_TRACT | 1 refills | Status: DC | PRN

## 2018-09-22 MED FILL — SIMVASTATIN 40 MG TABLET: 40 | 30 days supply | Qty: 30 | Fill #0

## 2018-09-22 MED FILL — SYMBICORT 80-4.5 MCG INHALE: 80-4.5 | 30 days supply | Qty: 7 | Fill #0

## 2018-09-22 MED FILL — HYDROCHLOROTHIAZIDE 12.5 MG: 12.5 | 30 days supply | Qty: 30 | Fill #0

## 2018-09-22 MED FILL — LOSARTAN POTASSIUM 50 MG TA: 50 | 30 days supply | Qty: 30 | Fill #0

## 2018-09-22 MED FILL — !VENTOLIN HFA INHALER: 108 (90 BAS | 16 days supply | Qty: 18 | Fill #0

## 2018-09-22 NOTE — Progress Notes (Signed)
Patient verified DOB Patient has taken medication Patient has not eaten today. Patient denies pain.

## 2018-09-22 NOTE — Progress Notes (Signed)
Virtual Visit via Telephone Note  I connected with [name] [name] on 09/22/18 at 10:50 AM EDT by telephone and verified that I am speaking with the correct person using two identifiers.   I discussed the limitations, risks, security and privacy concerns of performing an evaluation and management service by telephone and the availability of in person appointments. I also discussed with the patient that there may be a patient responsible charge related to this service. The patient expressed understanding and agreed to proceed.   History of Present Illness: with a medical history of COPD, tobacco use disorder, ETOH abuse, bullous emphysema, CAP , HTN and tobacco use disorder.  Pt tried nicotine patches which did not work and is smoking approximately 5-6 a day.   Observations/Objective: Review of Systems  Constitutional: Negative.   HENT: Negative.   Eyes: Negative.   Respiratory: Positive for cough, shortness of breath and wheezing.   Cardiovascular: Negative.   Gastrointestinal: Negative.   Genitourinary: Negative.   Skin: Negative.   Neurological: Positive for headaches.  Endo/Heme/Allergies: Negative.   Psychiatric/Behavioral: Negative.     Assessment and Plan: [name] [name] was seen today for hypertension and nicotine dependence.  Diagnoses and all orders for this visit: Cough  Nothing different from his usual c/o cough. I asked him what would his pulmonologist tell him he said stop smoking so I stated the same and recc-  Mucinex Dm Maximum Strength 60-1200 Mg Xr12h-tab (Dextromethorphan-guaifenesin)  One every 12 hours for cough albuterol (VENTOLIN HFA) 108 (90 Base) MCG/ACT inhaler; Inhale 2 puffs into the lungs every 4 (four) hours as needed for up to 30 days for wheezing or shortness of breath. -     budesonide-formoterol (SYMBICORT) 80-4.5 MCG/ACT inhaler; Inhale 2 puffs into the lungs 2 (two) times daily.  Chronic obstructive pulmonary disease, unspecified COPD type (HCC) -      albuterol (VENTOLIN HFA) 108 (90 Base) MCG/ACT inhaler; Inhale 2 puffs into the lungs every 4 (four) hours as needed for up to 30 days for wheezing or shortness of breath. -     budesonide-formoterol (SYMBICORT) 80-4.5 MCG/ACT inhaler; Inhale 2 puffs into the lungs 2 (two) times daily.  Hypertension, unspecified type -     hydrochlorothiazide (HYDRODIURIL) 12.5 MG tablet; Take 1 tablet (12.5 mg total) by mouth daily. -     losartan (COZAAR) 50 MG tablet; Take 1 tablet (50 mg total) by mouth daily.  Elevated lipoprotein(a) -     simvastatin (ZOCOR) 40 MG tablet; Take 1 tablet (40 mg total) by mouth at bedtime.    Follow Up Instructions:    I discussed the assessment and treatment plan with the patient. The patient was provided an opportunity to ask questions and all were answered. The patient agreed with the plan and demonstrated an understanding of the instructions.   The patient was advised to call back or seek an in-person evaluation if the symptoms worsen or if the condition fails to improve as anticipated.  I provided 20 minutes of non-face-to-face time during this encounter.   [name], NP

## 2019-01-12 MED FILL — SIMVASTATIN 40 MG TABLET: 40 | 30 days supply | Qty: 30 | Fill #3

## 2019-01-12 MED FILL — HYDROCHLOROTHIAZIDE 12.5 MG: 12.5 | 30 days supply | Qty: 30 | Fill #3

## 2019-01-12 MED FILL — LOSARTAN POTASSIUM 50 MG TA: 50 | 30 days supply | Qty: 30 | Fill #3

## 2019-02-03 ENCOUNTER — Ambulatory Visit (INDEPENDENT_AMBULATORY_CARE_PROVIDER_SITE_OTHER): Payer: Self-pay | Admitting: Primary Care

## 2019-02-03 ENCOUNTER — Encounter (INDEPENDENT_AMBULATORY_CARE_PROVIDER_SITE_OTHER): Payer: Self-pay | Admitting: Primary Care

## 2019-02-03 ENCOUNTER — Other Ambulatory Visit: Payer: Self-pay

## 2019-02-03 VITALS — BP 125/73 | HR 75 | Temp 97.5°F | Ht 62.0 in | Wt 140.2 lb

## 2019-02-03 DIAGNOSIS — F17200 Nicotine dependence, unspecified, uncomplicated: Secondary | ICD-10-CM

## 2019-02-03 DIAGNOSIS — I10 Essential (primary) hypertension: Secondary | ICD-10-CM

## 2019-02-03 DIAGNOSIS — R351 Nocturia: Secondary | ICD-10-CM

## 2019-02-03 DIAGNOSIS — E7841 Elevated Lipoprotein(a): Secondary | ICD-10-CM

## 2019-02-03 DIAGNOSIS — Z760 Encounter for issue of repeat prescription: Secondary | ICD-10-CM

## 2019-02-03 DIAGNOSIS — Z23 Encounter for immunization: Secondary | ICD-10-CM

## 2019-02-03 MED ORDER — SIMVASTATIN 40 MG PO TABS: 40.0000 mg | ORAL_TABLET | Freq: Every day | ORAL | 3 refills | Status: DC

## 2019-02-03 MED ORDER — LOSARTAN POTASSIUM 50 MG PO TABS: 50.0000 mg | ORAL_TABLET | Freq: Every day | ORAL | 3 refills | Status: DC

## 2019-02-03 MED ORDER — HYDROCHLOROTHIAZIDE 12.5 MG PO TABS: 12.5000 mg | ORAL_TABLET | Freq: Every day | ORAL | 3 refills | Status: DC

## 2019-02-03 NOTE — Patient Instructions (Signed)
Tobacco Use Disorder Tobacco use disorder (TUD) occurs when a person craves, seeks, and uses tobacco, regardless of the consequences. This disorder can cause problems with mental and physical health. It can affect your ability to have healthy relationships, and it can keep you from meeting your responsibilities at work, home, or school. Tobacco may be:  Smoked as a cigarette or cigar.  Inhaled using e-cigarettes.  Smoked in a pipe or hookah.  Chewed as smokeless tobacco.  Inhaled into the nostrils as snuff. Tobacco products contain a dangerous chemical called nicotine, which is very addictive. Nicotine triggers hormones that make the body feel stimulated and works on areas of the brain that make you feel good. These effects can make it hard for people to quit nicotine. Tobacco contains many other unsafe chemicals that can damage almost every organ in the body. Smoking tobacco also puts others in danger due to fire risk and possible health problems caused by breathing in secondhand smoke. What are the signs or symptoms? Symptoms of TUD may include:  Being unable to slow down or stop your tobacco use.  Spending an abnormal amount of time getting or using tobacco.  Craving tobacco. Cravings may last for up to 6 months after quitting.  Tobacco use that: ? Interferes with your work, school, or home life. ? Interferes with your personal and social relationships. ? Makes you give up activities that you once enjoyed or found important.  Using tobacco even though you know that it is: ? Dangerous or bad for your health or someone else's health. ? Causing problems in your life.  Needing more and more of the substance to get the same effect (developing tolerance).  Experiencing unpleasant symptoms if you do not use the substance (withdrawal). Withdrawal symptoms may include: ? Depressed, anxious, or irritable mood. ? Difficulty concentrating. ? Increased appetite. ? Restlessness or trouble  sleeping.  Using the substance to avoid withdrawal. How is this diagnosed? This condition may be diagnosed based on:  Your current and past tobacco use. Your health care provider may ask questions about how your tobacco use affects your life.  A physical exam. You may be diagnosed with TUD if you have at least two symptoms within a 75-month period. How is this treated? This condition is treated by stopping tobacco use. Many people are unable to quit on their own and need help. Treatment may include:  Nicotine replacement therapy (NRT). NRT provides nicotine without the other harmful chemicals in tobacco. NRT gradually lowers the dosage of nicotine in the body and reduces withdrawal symptoms. NRT is available as: ? Over-the-counter gums, lozenges, and skin patches. ? Prescription mouth inhalers and nasal sprays.  Medicine that acts on the brain to reduce cravings and withdrawal symptoms.  A type of talk therapy that examines your triggers for tobacco use, how to avoid them, and how to cope with cravings (behavioral therapy).  Hypnosis. This may help with withdrawal symptoms.  Joining a support group for others coping with TUD. The best treatment for TUD is usually a combination of medicine, talk therapy, and support groups. Recovery can be a long process. Many people start using tobacco again after stopping (relapse). If you relapse, it does not mean that treatment will not work. Follow these instructions at home:  Lifestyle  Do not use any products that contain nicotine or tobacco, such as cigarettes and e-cigarettes.  Avoid things that trigger tobacco use as much as you can. Triggers include people and situations that usually cause you  to use tobacco.  Avoid drinks that contain caffeine, including coffee. These may worsen some withdrawal symptoms.  Find ways to manage stress. Wanting to smoke may cause stress, and stress can make you want to smoke. Relaxation techniques such as  deep breathing, meditation, and yoga may help.  Attend support groups as needed. These groups are an important part of long-term recovery for many people. General instructions  Take over-the-counter and prescription medicines only as told by your health care provider.  Check with your health care provider before taking any new prescription or over-the-counter medicines.  Decide on a friend, family member, or smoking quit-line (such as 1-800-QUIT-NOW in the [place] that you can call or text when you feel the urge to smoke or when you need help coping with cravings.  Keep all follow-up visits as told by your health care provider and therapist. This is important. Contact a health care provider if:  You are not able to take your medicines as prescribed.  Your symptoms get worse, even with treatment. Summary  Tobacco use disorder (TUD) occurs when a person craves, seeks, and uses tobacco regardless of the consequences.  This condition may be diagnosed based on your current and past tobacco use and a physical exam.  Many people are unable to quit on their own and need help. Recovery can be a long process.  The most effective treatment for TUD is usually a combination of medicine, talk therapy, and support groups. This information is not intended to replace advice given to you by your health care provider. Make sure you discuss any questions you have with your health care provider. Document Released: 01/09/2004 Document Revised: 04/22/2017 Document Reviewed: 04/22/2017 Elsevier Patient Education  2020 [employer].

## 2019-02-03 NOTE — Progress Notes (Signed)
Established Patient Office Visit  Subjective:  Patient ID: [name] [name], male    DOB: October 19, 1955  Age: 63 y.o. MRN: [id number]  CC:  Chief Complaint  Patient presents with  . Hypertension  . Medication Refill    HPI Refujio Haymer presents for management of hypertension he denies shortness of breath, headaches, chest pain or lower extremity edema. He is requesting medication refills. Labs will also be completed.  History reviewed. No pertinent past medical history.  History reviewed. No pertinent surgical history.  History reviewed. No pertinent family history.  Social History   Socioeconomic History  . Marital status: Single    Spouse name: Not on file  . Number of children: Not on file  . Years of education: Not on file  . Highest education level: Not on file  Occupational History  . Not on file  Social Needs  . Financial resource strain: Not on file  . Food insecurity    Worry: Not on file    Inability: Not on file  . Transportation needs    Medical: Not on file    Non-medical: Not on file  Tobacco Use  . Smoking status: Current Every Day Smoker    Packs/day: 0.25    Types: Cigarettes  . Smokeless tobacco: Never Used  Substance and Sexual Activity  . Alcohol use: Not Currently  . Drug use: Never  . Sexual activity: Not Currently  Lifestyle  . Physical activity    Days per week: Not on file    Minutes per session: Not on file  . Stress: Not on file  Relationships  . Social Herbalist on phone: Not on file    Gets together: Not on file    Attends religious service: Not on file    Active member of club or organization: Not on file    Attends meetings of clubs or organizations: Not on file    Relationship status: Not on file  . Intimate partner violence    Fear of current or ex partner: Not on file    Emotionally abused: Not on file    Physically abused: Not on file    Forced sexual activity: Not on file  Other Topics Concern  . Not  on file  Social History Narrative  . Not on file    Outpatient Medications Prior to Visit  Medication Sig Dispense Refill  . aspirin 81 MG EC tablet Take 1 tablet (81 mg total) by mouth daily. 30 tablet 11  . budesonide-formoterol (SYMBICORT) 80-4.5 MCG/ACT inhaler Inhale 2 puffs into the lungs 2 (two) times daily. 1 Inhaler 11  . hydrochlorothiazide (HYDRODIURIL) 12.5 MG tablet Take 1 tablet (12.5 mg total) by mouth daily. 30 tablet 3  . losartan (COZAAR) 50 MG tablet Take 1 tablet (50 mg total) by mouth daily. 30 tablet 3  . simvastatin (ZOCOR) 40 MG tablet Take 1 tablet (40 mg total) by mouth at bedtime. 30 tablet 3  . albuterol (VENTOLIN HFA) 108 (90 Base) MCG/ACT inhaler Inhale 2 puffs into the lungs every 4 (four) hours as needed for up to 30 days for wheezing or shortness of breath. 1 Inhaler 1  . nicotine (NICODERM CQ - DOSED IN MG/24 HOURS) 14 mg/24hr patch Place 1 patch (14 mg total) onto the skin daily. (Patient not taking: Reported on 09/22/2018) 28 patch 1  . nicotine (NICODERM CQ - DOSED IN MG/24 HR) 7 mg/24hr patch Place 1 patch (7 mg total) onto the skin daily. (Patient  not taking: Reported on 09/22/2018) 28 patch 0   No facility-administered medications prior to visit.     No Known Allergies  ROS Review of Systems  Genitourinary: Positive for frequency.  All other systems reviewed and are negative.     Objective:    Physical Exam  Constitutional: He is oriented to person, place, and time. He appears well-developed and well-nourished.  HENT:  Head: Normocephalic.  Eyes: Pupils are equal, round, and reactive to light. EOM are normal.  Neck: Normal range of motion. Neck supple.  Cardiovascular: Normal rate and regular rhythm.  Pulmonary/Chest: Effort normal and breath sounds normal.  Abdominal: Soft. Bowel sounds are normal.  Musculoskeletal: Normal range of motion.  Neurological: He is alert and oriented to person, place, and time.  Skin: Skin is warm and dry.   Psychiatric: He has a normal mood and affect.    BP 125/73 (BP Location: Left Arm, Patient Position: Sitting, Cuff Size: Normal)   Pulse 75   Temp (!) 97.5 F (36.4 C) (Tympanic)   Ht '5\' 2"'$  (1.575 m)   Wt 140 lb 3.2 oz (63.6 kg)   SpO2 100%   BMI 25.64 kg/m  Wt Readings from Last 3 Encounters:  02/03/19 140 lb 3.2 oz (63.6 kg)  05/31/18 141 lb 9.6 oz (64.2 kg)  02/25/18 140 lb 3.2 oz (63.6 kg)     There are no preventive care reminders to display for this patient.  There are no preventive care reminders to display for this patient.  Lab Results  Component Value Date   TSH 1.291 Test methodology is 3rd generation TSH 05/28/2008   Lab Results  Component Value Date   WBC 6.3 02/03/2019   HGB 12.2 (L) 02/03/2019   HCT 39.7 02/03/2019   MCV 78 (L) 02/03/2019   PLT 309 02/03/2019   Lab Results  Component Value Date   NA 137 02/03/2019   K 4.1 02/03/2019   CO2 24 02/03/2019   GLUCOSE 81 02/03/2019   BUN 8 02/03/2019   CREATININE 0.83 02/03/2019   BILITOT 0.3 02/03/2019   ALKPHOS 73 02/03/2019   AST 17 02/03/2019   ALT 11 02/03/2019   PROT 7.2 02/03/2019   ALBUMIN 4.6 02/03/2019   CALCIUM 9.6 02/03/2019   Lab Results  Component Value Date   CHOL 174 02/03/2019   Lab Results  Component Value Date   HDL 38 (L) 02/03/2019   Lab Results  Component Value Date   LDLCALC 179 (H) 11/27/2017   Lab Results  Component Value Date   TRIG 110 02/03/2019   Lab Results  Component Value Date   CHOLHDL 4.6 02/03/2019   Lab Results  Component Value Date   HGBA1C 5.6 05/31/2018      Assessment & Plan:  Olie was seen today for hypertension and medication refill.  Diagnoses and all orders for this visit:  Tobacco use disorder Tobacco products contain nicotine.Triggers, that make the body feel stimulated and works on areas of the brain to make you feel good.  It is very difficult to stop smoking without help or medication.  Smoking can affect every organ in the  body.  It is also primary cause of lung cancer and other respiratory diseases.   Need for immunization against influenza CDC recommends influenza vaccine yearly.  Flu intercanthus respiratory illness caused by influenza virus that affects the nose throat and sometimes the lungs.  Experts believe that liver spread managed by tract was made mainly by tiny droplets made when  people with the flu cough sneeze or talk. -     Flu Vaccine QUAD 36+ mos IM  Hypertension, unspecified type Counseled on blood pressure goal of less than 130/80, low-sodium, DASH diet, medication compliance, 150 minutes of moderate intensity exercise per week. Discussed medication compliance, adverse effects. -     hydrochlorothiazide (HYDRODIURIL) 12.5 MG tablet; Take 1 tablet (12.5 mg total) by mouth daily. -     losartan (COZAAR) 50 MG tablet; Take 1 tablet (50 mg total) by mouth daily. -     CBC with Differential -     CMP14+EGFR -     PSA  Elevated lipoprotein(a) To lower your cholesterol you can decrease your fatty foods, red meat, cheese, milk and increase fiber like whole grains and veggies. You can also add a fiber supplement like Metamucil or Benefiber.  -     simvastatin (ZOCOR) 40 MG tablet; Take 1 tablet (40 mg total) by mouth at bedtime. -     Lipid Panel  Nocturia Prostrate Cancer Screening For men aged 30 to 23 years, the decision to undergo periodic prostate-specific antigen (PSA)-based screening for prostate cancer should be an individual one. Before deciding whether to be screened, men should have an opportunity to discuss the potential benefits and harms of screening with their clinician and to incorporate their values and preferences in the decision. Screening offers a small potential benefit of reducing the chance of death from prostate cancer in some men. However, many men will experience potential harms of screening, including false-positive results that require additional testing and possible prostate  biopsy; overdiagnosis and overtreatment; and treatment complications, such as incontinence and erectile dysfunction. In determining whether this service is appropriate in individual cases, patients and clinicians should consider the balance of benefits and harms on the basis of family history, race/ethnicity, comorbid medical conditions, patient values about the benefits and harms of screening and treatment-specific outcomes, and other health needs. Clinicians should not screen men who do not express a preference for screening. -     PSA  Medication refill Meds ordered this encounter  Medications  . hydrochlorothiazide (HYDRODIURIL) 12.5 MG tablet    Sig: Take 1 tablet (12.5 mg total) by mouth daily.    Dispense:  30 tablet    Refill:  3  . simvastatin (ZOCOR) 40 MG tablet    Sig: Take 1 tablet (40 mg total) by mouth at bedtime.    Dispense:  30 tablet    Refill:  3  . losartan (COZAAR) 50 MG tablet    Sig: Take 1 tablet (50 mg total) by mouth daily.    Dispense:  30 tablet    Refill:  3    Follow-up: Return in about 6 months (around 08/03/2019) for HTN.    [name], NP

## 2019-02-04 LAB — LIPID PANEL
Chol/HDL Ratio: 4.6 ratio (ref 0.0–5.0)
Cholesterol, Total: 174 mg/dL (ref 100–199)
HDL: 38 mg/dL — ABNORMAL LOW (ref >39)
LDL Chol Calc (NIH): 116 mg/dL — ABNORMAL HIGH (ref 0–99)
Triglycerides: 110 mg/dL (ref 0–149)
VLDL Cholesterol Cal: 20 mg/dL (ref 5–40)

## 2019-02-04 LAB — CBC WITH DIFFERENTIAL/PLATELET
Basophils Absolute: 0.1 10*3/uL (ref 0.0–0.2)
Basos: 1 %
EOS (ABSOLUTE): 0.1 10*3/uL (ref 0.0–0.4)
Eos: 2 %
Hematocrit: 39.7 % (ref 37.5–51.0)
Hemoglobin: 12.2 g/dL — ABNORMAL LOW (ref 13.0–17.7)
Immature Grans (Abs): 0 10*3/uL (ref 0.0–0.1)
Immature Granulocytes: 0 %
Lymphocytes Absolute: 1.9 10*3/uL (ref 0.7–3.1)
Lymphs: 31 %
MCH: 23.8 pg — ABNORMAL LOW (ref 26.6–33.0)
MCHC: 30.7 g/dL — ABNORMAL LOW (ref 31.5–35.7)
MCV: 78 fL — ABNORMAL LOW (ref 79–97)
Monocytes Absolute: 0.7 10*3/uL (ref 0.1–0.9)
Monocytes: 10 %
Neutrophils Absolute: 3.6 10*3/uL (ref 1.4–7.0)
Neutrophils: 56 %
Platelets: 309 10*3/uL (ref 150–450)
RBC: 5.12 x10E6/uL (ref 4.14–5.80)
RDW: 14.3 % (ref 11.6–15.4)
WBC: 6.3 10*3/uL (ref 3.4–10.8)

## 2019-02-04 LAB — CMP14+EGFR
ALT: 11 IU/L (ref 0–44)
AST: 17 IU/L (ref 0–40)
Albumin/Globulin Ratio: 1.8 (ref 1.2–2.2)
Albumin: 4.6 g/dL (ref 3.8–4.8)
Alkaline Phosphatase: 73 IU/L (ref 39–117)
BUN/Creatinine Ratio: 10 (ref 10–24)
BUN: 8 mg/dL (ref 8–27)
Bilirubin Total: 0.3 mg/dL (ref 0.0–1.2)
CO2: 24 mmol/L (ref 20–29)
Calcium: 9.6 mg/dL (ref 8.6–10.2)
Chloride: 96 mmol/L (ref 96–106)
Creatinine, Ser: 0.83 mg/dL (ref 0.76–1.27)
GFR calc Af Amer: 110 mL/min/{1.73_m2} (ref >59)
GFR calc non Af Amer: 95 mL/min/{1.73_m2} (ref >59)
Globulin, Total: 2.6 g/dL (ref 1.5–4.5)
Glucose: 81 mg/dL (ref 65–99)
Potassium: 4.1 mmol/L (ref 3.5–5.2)
Sodium: 137 mmol/L (ref 134–144)
Total Protein: 7.2 g/dL (ref 6.0–8.5)

## 2019-02-04 LAB — PSA: Prostate Specific Ag, Serum: 1.3 ng/mL (ref 0.0–4.0)

## 2019-02-07 ENCOUNTER — Telehealth (INDEPENDENT_AMBULATORY_CARE_PROVIDER_SITE_OTHER): Payer: Self-pay

## 2019-02-07 NOTE — Telephone Encounter (Signed)
Left voicemail notifying patient that all labs are normal except elevated LDL. Advised patient to continue simvastatin and avoid red meats, fried foods, junk foods, sodas, sugary foods/drinks, smoking/alcohol and unhealthy snacking. Please return call to [facility] at [phone] with any concerns or questions. Nat Christen, CMA

## 2019-02-07 NOTE — Telephone Encounter (Signed)
-----   Message from [name], NP sent at 02/06/2019  9:21 AM EDT ----- Labs are essentially normal. There are some minor variations in your blood work that do not require any additional work up at this time. Will continue to monitor. Make sure you are drinking at least 48 oz of water per day. Work on eating a low fat, heart healthy diet and participate in regular aerobic exercise program to control as well. Exercise at least  30 minutes per day-5 days per week. Avoid red meat. No fried foods. No junk foods, sodas, sugary foods or drinks, unhealthy snacking, alcohol or smoking.   Your LDL   Is elevated cont. Taking simvastatin and PSA wnl

## 2019-03-21 MED FILL — LOSARTAN POTASSIUM 50 MG TA: 50 | 30 days supply | Qty: 30 | Fill #0

## 2019-03-21 MED FILL — HYDROCHLOROTHIAZIDE 12.5 MG: 12.5 | 30 days supply | Qty: 30 | Fill #0

## 2019-03-21 MED FILL — SIMVASTATIN 40 MG TABLET: 40 | 30 days supply | Qty: 30 | Fill #0

## 2019-04-28 MED FILL — HYDROCHLOROTHIAZIDE 12.5 MG: 12.5 | 30 days supply | Qty: 30 | Fill #1

## 2019-04-28 MED FILL — LOSARTAN POTASSIUM 50 MG TA: 50 | 30 days supply | Qty: 30 | Fill #1

## 2019-04-28 MED FILL — SIMVASTATIN 40 MG TABLET: 40 | 30 days supply | Qty: 30 | Fill #1

## 2019-06-04 ENCOUNTER — Other Ambulatory Visit: Payer: Self-pay

## 2019-06-04 DIAGNOSIS — Z20822 Contact with and (suspected) exposure to covid-19: Secondary | ICD-10-CM

## 2019-06-05 LAB — NOVEL CORONAVIRUS, NAA: SARS-CoV-2, NAA: NOT DETECTED

## 2019-08-03 ENCOUNTER — Other Ambulatory Visit: Payer: Self-pay

## 2019-08-03 ENCOUNTER — Ambulatory Visit (INDEPENDENT_AMBULATORY_CARE_PROVIDER_SITE_OTHER): Payer: Self-pay | Admitting: Primary Care

## 2019-08-03 ENCOUNTER — Encounter (INDEPENDENT_AMBULATORY_CARE_PROVIDER_SITE_OTHER): Payer: Self-pay | Admitting: Primary Care

## 2019-08-03 VITALS — BP 108/75 | HR 96 | Temp 97.3°F | Ht 62.0 in | Wt 135.2 lb

## 2019-08-03 DIAGNOSIS — R059 Cough, unspecified: Secondary | ICD-10-CM

## 2019-08-03 DIAGNOSIS — R05 Cough: Secondary | ICD-10-CM

## 2019-08-03 DIAGNOSIS — J449 Chronic obstructive pulmonary disease, unspecified: Secondary | ICD-10-CM

## 2019-08-03 DIAGNOSIS — E782 Mixed hyperlipidemia: Secondary | ICD-10-CM

## 2019-08-03 DIAGNOSIS — I10 Essential (primary) hypertension: Secondary | ICD-10-CM

## 2019-08-03 MED ORDER — HYDROCHLOROTHIAZIDE 12.5 MG PO TABS: 12.5000 mg | ORAL_TABLET | Freq: Every day | ORAL | 1 refills | Status: DC

## 2019-08-03 MED ORDER — LOSARTAN POTASSIUM 25 MG PO TABS: 25.0000 mg | ORAL_TABLET | Freq: Every day | ORAL | 1 refills | Status: DC

## 2019-08-03 MED ORDER — LOSARTAN POTASSIUM 50 MG PO TABS: 50.0000 mg | ORAL_TABLET | Freq: Every day | ORAL | 1 refills | Status: DC

## 2019-08-03 MED ORDER — BUDESONIDE-FORMOTEROL FUMARATE 80-4.5 MCG/ACT IN AERO: 2.0000 | INHALATION_SPRAY | Freq: Two times a day (BID) | RESPIRATORY_TRACT | 11 refills | Status: DC

## 2019-08-03 MED ORDER — ALBUTEROL SULFATE HFA 108 (90 BASE) MCG/ACT IN AERS: 2.0000 | INHALATION_SPRAY | RESPIRATORY_TRACT | 1 refills | Status: DC | PRN

## 2019-08-03 MED FILL — !VENTOLIN HFA INHALER: 108 (90 BAS | 25 days supply | Qty: 18 | Fill #0

## 2019-08-03 MED FILL — LOSARTAN POTASSIUM 25 MG TA: 25 | 30 days supply | Qty: 30 | Fill #0

## 2019-08-03 MED FILL — SYMBICORT 80-4.5 MCG INH: 80-4.5 | 30 days supply | Qty: 10 | Fill #0

## 2019-08-03 MED FILL — HYDROCHLOROTHIAZIDE 12.5 MG: 12.5 | 30 days supply | Qty: 30 | Fill #0

## 2019-08-03 NOTE — Patient Instructions (Signed)

## 2019-08-03 NOTE — Progress Notes (Signed)
Established Patient Office Visit  Subjective:  Patient ID: [name] [name], male    DOB: February 21, 1956  Age: 64 y.o. MRN: [id number]  CC:  Chief Complaint  Patient presents with  . Hypertension  . Medication Refill    HPI [name] [name] is a 64 year old African American male who presents for management hypertension and medication refills. He denies shortness of breath, headaches, chest pain or lower extremity edema. No other concerns or complaints.  History reviewed. No pertinent past medical history.  History reviewed. No pertinent surgical history.  History reviewed. No pertinent family history.  Social History   Socioeconomic History  . Marital status: Single    Spouse name: Not on file  . Number of children: Not on file  . Years of education: Not on file  . Highest education level: Not on file  Occupational History  . Not on file  Tobacco Use  . Smoking status: Current Every Day Smoker    Packs/day: 0.25    Types: Cigarettes  . Smokeless tobacco: Never Used  Substance and Sexual Activity  . Alcohol use: Not Currently  . Drug use: Never  . Sexual activity: Not Currently  Other Topics Concern  . Not on file  Social History Narrative  . Not on file   Social Determinants of Health   Financial Resource Strain:   . Difficulty of Paying Living Expenses:   Food Insecurity:   . Worried About Charity fundraiser in the Last Year:   . Arboriculturist in the Last Year:   Transportation Needs:   . Film/video editor (Medical):   Marland Kitchen Lack of Transportation (Non-Medical):   Physical Activity:   . Days of Exercise per Week:   . Minutes of Exercise per Session:   Stress:   . Feeling of Stress :   Social Connections:   . Frequency of Communication with Friends and Family:   . Frequency of Social Gatherings with Friends and Family:   . Attends Religious Services:   . Active Member of Clubs or Organizations:   . Attends Archivist Meetings:   Marland Kitchen Marital  Status:   Intimate Partner Violence:   . Fear of Current or Ex-Partner:   . Emotionally Abused:   Marland Kitchen Physically Abused:   . Sexually Abused:     Outpatient Medications Prior to Visit  Medication Sig Dispense Refill  . aspirin 81 MG EC tablet Take 1 tablet (81 mg total) by mouth daily. 30 tablet 11  . simvastatin (ZOCOR) 40 MG tablet Take 1 tablet (40 mg total) by mouth at bedtime. 30 tablet 3  . budesonide-formoterol (SYMBICORT) 80-4.5 MCG/ACT inhaler Inhale 2 puffs into the lungs 2 (two) times daily. 1 Inhaler 11  . hydrochlorothiazide (HYDRODIURIL) 12.5 MG tablet Take 1 tablet (12.5 mg total) by mouth daily. 30 tablet 3  . losartan (COZAAR) 50 MG tablet Take 1 tablet (50 mg total) by mouth daily. 30 tablet 3  . albuterol (VENTOLIN HFA) 108 (90 Base) MCG/ACT inhaler Inhale 2 puffs into the lungs every 4 (four) hours as needed for up to 30 days for wheezing or shortness of breath. 1 Inhaler 1   No facility-administered medications prior to visit.    No Known Allergies  ROS Review of Systems  Genitourinary: Positive for frequency.  Musculoskeletal: Positive for back pain.  All other systems reviewed and are negative.     Objective:    Physical Exam  Constitutional: He is oriented to person, place,  and time. He appears well-developed and well-nourished.  HENT:  Head: Normocephalic.  Eyes: Pupils are equal, round, and reactive to light. EOM are normal.  Cardiovascular: Normal rate and regular rhythm.  Pulmonary/Chest: Effort normal and breath sounds normal.  Abdominal: Bowel sounds are normal.  Musculoskeletal:        General: Normal range of motion.     Cervical back: Normal range of motion.  Neurological: He is alert and oriented to person, place, and time. He has normal reflexes.  Skin: Skin is warm and dry.  Psychiatric: He has a normal mood and affect. His behavior is normal. Judgment and thought content normal.    BP 108/75 (BP Location: Left Arm, Patient Position:  Sitting, Cuff Size: Normal)   Pulse 96   Temp (!) 97.3 F (36.3 C) (Temporal)   Ht 5\' 2"  (1.575 m)   Wt 135 lb 3.2 oz (61.3 kg)   SpO2 97%   BMI 24.73 kg/m  Wt Readings from Last 3 Encounters:  08/03/19 135 lb 3.2 oz (61.3 kg)  02/03/19 140 lb 3.2 oz (63.6 kg)  05/31/18 141 lb 9.6 oz (64.2 kg)     There are no preventive care reminders to display for this patient.  There are no preventive care reminders to display for this patient.   Lab Results  Component Value Date   WBC 6.3 08/03/2019   HGB 12.3 (L) 08/03/2019   HCT 38.5 08/03/2019   MCV 76 (L) 08/03/2019   PLT 320 08/03/2019   Lab Results  Component Value Date   NA 144 08/03/2019   K 4.6 08/03/2019   CO2 22 08/03/2019   GLUCOSE 63 (L) 08/03/2019   BUN 9 08/03/2019   CREATININE 0.74 (L) 08/03/2019   BILITOT <0.2 08/03/2019   ALKPHOS 65 08/03/2019   AST 29 08/03/2019   ALT 15 08/03/2019   PROT 7.3 08/03/2019   ALBUMIN 4.6 08/03/2019   CALCIUM 9.9 08/03/2019   Lab Results  Component Value Date   CHOL 195 08/03/2019   Lab Results  Component Value Date   HDL 65 08/03/2019   Lab Results  Component Value Date   LDLCALC 109 (H) 08/03/2019   Lab Results  Component Value Date   TRIG 119 08/03/2019   Lab Results  Component Value Date   CHOLHDL 3.0 08/03/2019   Lab Results  Component Value Date   HGBA1C 5.6 05/31/2018      Assessment & Plan:  Wolfgang was seen today for hypertension and medication refill.  Diagnoses and all orders for this visit:  Mixed hyperlipidemia -     Lipid Panel  Hypertension, unspecified type Counseled on blood pressure goal of less than 130/80, low-sodium, DASH diet, medication compliance, 150 minutes of moderate intensity exercise per week. Discussed medication compliance, adverse effects. -     Discontinue: losartan (COZAAR) 50 MG tablet; Take 1 tablet (50 mg total) by mouth daily. -     hydrochlorothiazide (HYDRODIURIL) 12.5 MG tablet; Take 1 tablet (12.5 mg total)  by mouth daily. -     CBC with Differential -     Comprehensive metabolic panel -     losartan (COZAAR) 25 MG tablet; Take 1 tablet (25 mg total) by mouth daily.  Cough You make take over the counter cough medications advised to - Drink at least 64 ounces of water each day. If you have a humidifier use it nightly.  Remove as many irritants/allergies as you are able to, no pets in the bedroom, change  air filters in air vents.. Can be secondary to your COPD -     albuterol (VENTOLIN HFA) 108 (90 Base) MCG/ACT inhaler; Inhale 2 puffs into the lungs every 4 (four) hours as needed for wheezing or shortness of breath. -     budesonide-formoterol (SYMBICORT) 80-4.5 MCG/ACT inhaler; Inhale 2 puffs into the lungs 2 (two) times daily.  Chronic obstructive pulmonary disease, unspecified COPD type   COPD affects the lungs and causes reduced airflow, which makes it hard to breathe. This may worsens over time. -     albuterol (VENTOLIN HFA) 108 (90 Base) MCG/ACT inhaler; Inhale 2 puffs into the lungs every 4 (four) hours as needed for wheezing or shortness of breath. -     budesonide-formoterol (SYMBICORT) 80-4.5 MCG/ACT inhaler; Inhale 2 puffs into the lungs 2 (two) times daily.    Meds ordered this encounter  Medications  . DISCONTD: losartan (COZAAR) 50 MG tablet    Sig: Take 1 tablet (50 mg total) by mouth daily.    Dispense:  90 tablet    Refill:  1  . albuterol (VENTOLIN HFA) 108 (90 Base) MCG/ACT inhaler    Sig: Inhale 2 puffs into the lungs every 4 (four) hours as needed for wheezing or shortness of breath.    Dispense:  18 g    Refill:  1  . budesonide-formoterol (SYMBICORT) 80-4.5 MCG/ACT inhaler    Sig: Inhale 2 puffs into the lungs 2 (two) times daily.    Dispense:  1 Inhaler    Refill:  11  . hydrochlorothiazide (HYDRODIURIL) 12.5 MG tablet    Sig: Take 1 tablet (12.5 mg total) by mouth daily.    Dispense:  90 tablet    Refill:  1  . losartan (COZAAR) 25 MG tablet    Sig: Take 1  tablet (25 mg total) by mouth daily.    Dispense:  90 tablet    Refill:  1    Follow-up: Return in about 6 weeks (around 09/14/2019) for Bp ck in person.    [name], NP

## 2019-08-04 LAB — COMPREHENSIVE METABOLIC PANEL
ALT: 15 IU/L (ref 0–44)
AST: 29 IU/L (ref 0–40)
Albumin/Globulin Ratio: 1.7 (ref 1.2–2.2)
Albumin: 4.6 g/dL (ref 3.8–4.8)
Alkaline Phosphatase: 65 IU/L (ref 39–117)
BUN/Creatinine Ratio: 12 (ref 10–24)
BUN: 9 mg/dL (ref 8–27)
Bilirubin Total: <0.2 mg/dL (ref 0.0–1.2)
CO2: 22 mmol/L (ref 20–29)
Calcium: 9.9 mg/dL (ref 8.6–10.2)
Chloride: 105 mmol/L (ref 96–106)
Creatinine, Ser: 0.74 mg/dL — ABNORMAL LOW (ref 0.76–1.27)
GFR calc Af Amer: 114 mL/min/{1.73_m2} (ref >59)
GFR calc non Af Amer: 99 mL/min/{1.73_m2} (ref >59)
Globulin, Total: 2.7 g/dL (ref 1.5–4.5)
Glucose: 63 mg/dL — ABNORMAL LOW (ref 65–99)
Potassium: 4.6 mmol/L (ref 3.5–5.2)
Sodium: 144 mmol/L (ref 134–144)
Total Protein: 7.3 g/dL (ref 6.0–8.5)

## 2019-08-04 LAB — CBC WITH DIFFERENTIAL/PLATELET
Basophils Absolute: 0 10*3/uL (ref 0.0–0.2)
Basos: 1 %
EOS (ABSOLUTE): 0.1 10*3/uL (ref 0.0–0.4)
Eos: 2 %
Hematocrit: 38.5 % (ref 37.5–51.0)
Hemoglobin: 12.3 g/dL — ABNORMAL LOW (ref 13.0–17.7)
Immature Grans (Abs): 0 10*3/uL (ref 0.0–0.1)
Immature Granulocytes: 0 %
Lymphocytes Absolute: 2.3 10*3/uL (ref 0.7–3.1)
Lymphs: 37 %
MCH: 24.1 pg — ABNORMAL LOW (ref 26.6–33.0)
MCHC: 31.9 g/dL (ref 31.5–35.7)
MCV: 76 fL — ABNORMAL LOW (ref 79–97)
Monocytes Absolute: 0.6 10*3/uL (ref 0.1–0.9)
Monocytes: 9 %
Neutrophils Absolute: 3.2 10*3/uL (ref 1.4–7.0)
Neutrophils: 51 %
Platelets: 320 10*3/uL (ref 150–450)
RBC: 5.1 x10E6/uL (ref 4.14–5.80)
RDW: 15.8 % — ABNORMAL HIGH (ref 11.6–15.4)
WBC: 6.3 10*3/uL (ref 3.4–10.8)

## 2019-08-04 LAB — LIPID PANEL
Chol/HDL Ratio: 3 ratio (ref 0.0–5.0)
Cholesterol, Total: 195 mg/dL (ref 100–199)
HDL: 65 mg/dL (ref >39)
LDL Chol Calc (NIH): 109 mg/dL — ABNORMAL HIGH (ref 0–99)
Triglycerides: 119 mg/dL (ref 0–149)
VLDL Cholesterol Cal: 21 mg/dL (ref 5–40)

## 2019-08-12 ENCOUNTER — Telehealth (INDEPENDENT_AMBULATORY_CARE_PROVIDER_SITE_OTHER): Payer: Self-pay

## 2019-08-12 NOTE — Telephone Encounter (Signed)
Patient verified date of birth. He is aware that cholesterol elevated. No medication at this time. Decrease fatty food, red meat, cheese, milk and increase whole grains and veggies. Patient advised to purchase over the counter multivitamin with iron. He verbalized understanding. Nat Christen, CMA

## 2019-08-12 NOTE — Telephone Encounter (Signed)
-----   Message from [name], NP sent at 08/12/2019 12:10 PM EDT ----- Patient can take multivitamin with iron and decrease your fatty foods, red meat, cheese, milk and increase fiber like whole grains and veggies. No cholesterol medication at this time lifestyle modifications

## 2019-09-14 ENCOUNTER — Ambulatory Visit (INDEPENDENT_AMBULATORY_CARE_PROVIDER_SITE_OTHER): Payer: Self-pay | Admitting: Primary Care

## 2019-09-14 ENCOUNTER — Other Ambulatory Visit: Payer: Self-pay

## 2019-09-14 ENCOUNTER — Encounter (INDEPENDENT_AMBULATORY_CARE_PROVIDER_SITE_OTHER): Payer: Self-pay | Admitting: Primary Care

## 2019-09-14 VITALS — BP 111/72 | HR 95 | Temp 97.5°F | Ht 62.0 in | Wt 133.4 lb

## 2019-09-14 DIAGNOSIS — F17200 Nicotine dependence, unspecified, uncomplicated: Secondary | ICD-10-CM

## 2019-09-14 DIAGNOSIS — I10 Essential (primary) hypertension: Secondary | ICD-10-CM

## 2019-09-14 NOTE — Progress Notes (Signed)
Presents for  hypertension evaluation, on previous visit medication was adjusted to include decreasing Losartan 25mg  and HCTA 12.5mg . He has not been taking HCTZ 12.5. Today's reading will discontinue HCTZ 25mg .  Patient is adherence with medications.  Current Medication List Current Outpatient Medications on File Prior to Visit  Medication Sig Dispense Refill  . aspirin 81 MG EC tablet Take 1 tablet (81 mg total) by mouth daily. 30 tablet 11  . budesonide-formoterol (SYMBICORT) 80-4.5 MCG/ACT inhaler Inhale 2 puffs into the lungs 2 (two) times daily. 1 Inhaler 11  . albuterol (VENTOLIN HFA) 108 (90 Base) MCG/ACT inhaler Inhale 2 puffs into the lungs every 4 (four) hours as needed for wheezing or shortness of breath. 18 g 1  . losartan (COZAAR) 25 MG tablet Take 1 tablet (25 mg total) by mouth daily. (Patient not taking: Reported on 09/14/2019) 90 tablet 1   No current facility-administered medications on file prior to visit.   Past Medical History  Hypertension COPD Tobacco Abuse Dietary habits include: healthy heart diet  Exercise habits include: walk daily  Family / Social history: No  ASCVD risk factors include- [place]  O:  Physical Exam Vitals reviewed.  HENT:     Head: Normocephalic.  Cardiovascular:     Rate and Rhythm: Normal rate and regular rhythm.     Pulses: Normal pulses.     Heart sounds: Normal heart sounds.  Pulmonary:     Effort: Pulmonary effort is normal.     Breath sounds: Normal breath sounds.  Abdominal:     General: Bowel sounds are normal.  Musculoskeletal:        General: Normal range of motion.     Cervical back: Normal range of motion.  Skin:    General: Skin is warm and dry.  Neurological:     Mental Status: He is alert and oriented to person, place, and time.  Psychiatric:        Mood and Affect: Mood normal.        Behavior: Behavior normal.        Thought Content: Thought content normal.      Review of Systems  All other systems  reviewed and are negative.   Last 3 Office BP readings: BP Readings from Last 3 Encounters:  09/14/19 111/72  08/03/19 108/75  02/03/19 125/73    BMET    Component Value Date/Time   NA 144 08/03/2019 1527   K 4.6 08/03/2019 1527   CL 105 08/03/2019 1527   CO2 22 08/03/2019 1527   GLUCOSE 63 (L) 08/03/2019 1527   GLUCOSE 95 10/17/2008 1745   BUN 9 08/03/2019 1527   CREATININE 0.74 (L) 08/03/2019 1527   CALCIUM 9.9 08/03/2019 1527   GFRNONAA 99 08/03/2019 1527   GFRAA 114 08/03/2019 1527    Renal function: CrCl cannot be calculated (Patient's most recent lab result is older than the maximum 21 days allowed.).  Clinical ASCVD: Yes  The 10-year ASCVD risk score Mikey Bussing DC Jr., et al., 2013) is: 17.2%   Values used to calculate the score:     Age: 64 years     Sex: Male     Is Non-Hispanic African American: Yes     Diabetic: No     Tobacco smoker: Yes     Systolic Blood Pressure: 161 mmHg     Is BP treated: Yes     HDL Cholesterol: 65 mg/dL     Total Cholesterol: 195 mg/dL   A/P:  Hypertension, unspecified type  Hypertension longstanding diagnosis Losartan 25mg   on current medications. BP Goal 130/80 mmHg. Patient is adherent with current medications.  -Discontinued HCTZ 12.5mg  Continue Losartan 25mg  daily -F/u labs ordered none -Counseled on lifestyle modifications for blood pressure control including reduced dietary sodium, increased exercise, adequate sleep  Tobacco use disorder A onward battle . He is aware of increased risk for lung cancer and other respiratory diseases recommend cessation.  This will be reminded at each clinical visit.

## 2019-09-14 NOTE — Progress Notes (Signed)
Pt taking losartan 50 mg taking half

## 2019-09-14 NOTE — Patient Instructions (Signed)
Tobacco Use Disorder Tobacco use disorder (TUD) occurs when a person craves, seeks, and uses tobacco, regardless of the consequences. This disorder can cause problems with mental and physical health. It can affect your ability to have healthy relationships, and it can keep you from meeting your responsibilities at work, home, or school. Tobacco may be:  Smoked as a cigarette or cigar.  Inhaled using e-cigarettes.  Smoked in a pipe or hookah.  Chewed as smokeless tobacco.  Inhaled into the nostrils as snuff. Tobacco products contain a dangerous chemical called nicotine, which is very addictive. Nicotine triggers hormones that make the body feel stimulated and works on areas of the brain that make you feel good. These effects can make it hard for people to quit nicotine. Tobacco contains many other unsafe chemicals that can damage almost every organ in the body. Smoking tobacco also puts others in danger due to fire risk and possible health problems caused by breathing in secondhand smoke. What are the signs or symptoms? Symptoms of TUD may include:  Being unable to slow down or stop your tobacco use.  Spending an abnormal amount of time getting or using tobacco.  Craving tobacco. Cravings may last for up to 6 months after quitting.  Tobacco use that: ? Interferes with your work, school, or home life. ? Interferes with your personal and social relationships. ? Makes you give up activities that you once enjoyed or found important.  Using tobacco even though you know that it is: ? Dangerous or bad for your health or someone else's health. ? Causing problems in your life.  Needing more and more of the substance to get the same effect (developing tolerance).  Experiencing unpleasant symptoms if you do not use the substance (withdrawal). Withdrawal symptoms may include: ? Depressed, anxious, or irritable mood. ? Difficulty concentrating. ? Increased appetite. ? Restlessness or trouble  sleeping.  Using the substance to avoid withdrawal. How is this diagnosed? This condition may be diagnosed based on:  Your current and past tobacco use. Your health care provider may ask questions about how your tobacco use affects your life.  A physical exam. You may be diagnosed with TUD if you have at least two symptoms within a 12-month period. How is this treated? This condition is treated by stopping tobacco use. Many people are unable to quit on their own and need help. Treatment may include:  Nicotine replacement therapy (NRT). NRT provides nicotine without the other harmful chemicals in tobacco. NRT gradually lowers the dosage of nicotine in the body and reduces withdrawal symptoms. NRT is available as: ? Over-the-counter gums, lozenges, and skin patches. ? Prescription mouth inhalers and nasal sprays.  Medicine that acts on the brain to reduce cravings and withdrawal symptoms.  A type of talk therapy that examines your triggers for tobacco use, how to avoid them, and how to cope with cravings (behavioral therapy).  Hypnosis. This may help with withdrawal symptoms.  Joining a support group for others coping with TUD. The best treatment for TUD is usually a combination of medicine, talk therapy, and support groups. Recovery can be a long process. Many people start using tobacco again after stopping (relapse). If you relapse, it does not mean that treatment will not work. Follow these instructions at home:  Lifestyle  Do not use any products that contain nicotine or tobacco, such as cigarettes and e-cigarettes.  Avoid things that trigger tobacco use as much as you can. Triggers include people and situations that usually cause you   to use tobacco.  Avoid drinks that contain caffeine, including coffee. These may worsen some withdrawal symptoms.  Find ways to manage stress. Wanting to smoke may cause stress, and stress can make you want to smoke. Relaxation techniques such as  deep breathing, meditation, and yoga may help.  Attend support groups as needed. These groups are an important part of long-term recovery for many people. General instructions  Take over-the-counter and prescription medicines only as told by your health care provider.  Check with your health care provider before taking any new prescription or over-the-counter medicines.  Decide on a friend, family member, or smoking quit-line (such as 1-800-QUIT-NOW in the [place] that you can call or text when you feel the urge to smoke or when you need help coping with cravings.  Keep all follow-up visits as told by your health care provider and therapist. This is important. Contact a health care provider if:  You are not able to take your medicines as prescribed.  Your symptoms get worse, even with treatment. Summary  Tobacco use disorder (TUD) occurs when a person craves, seeks, and uses tobacco regardless of the consequences.  This condition may be diagnosed based on your current and past tobacco use and a physical exam.  Many people are unable to quit on their own and need help. Recovery can be a long process.  The most effective treatment for TUD is usually a combination of medicine, talk therapy, and support groups. This information is not intended to replace advice given to you by your health care provider. Make sure you discuss any questions you have with your health care provider. Document Revised: 04/22/2017 Document Reviewed: 04/22/2017 Elsevier Patient Education  2020 [employer] Inc.  

## 2019-09-21 MED FILL — LOSARTAN POTASSIUM 25 MG TA: 25 | 30 days supply | Qty: 30 | Fill #0

## 2019-11-15 MED FILL — LOSARTAN POTASSIUM 25 MG TA: 25 | 30 days supply | Qty: 30 | Fill #1

## 2019-12-02 ENCOUNTER — Telehealth (INDEPENDENT_AMBULATORY_CARE_PROVIDER_SITE_OTHER): Payer: Self-pay | Admitting: Primary Care

## 2019-12-02 NOTE — Telephone Encounter (Signed)
Left message asking patient to call office and schedule appointment to discuss back pain.

## 2019-12-02 NOTE — Telephone Encounter (Signed)
Pt states he is having back pain and wanted to speak with Sharyn Lull or nurse to ask about what they think he can take for it/ please advise

## 2019-12-08 ENCOUNTER — Ambulatory Visit (INDEPENDENT_AMBULATORY_CARE_PROVIDER_SITE_OTHER): Payer: Self-pay | Admitting: Primary Care

## 2019-12-08 ENCOUNTER — Encounter (INDEPENDENT_AMBULATORY_CARE_PROVIDER_SITE_OTHER): Payer: Self-pay | Admitting: Primary Care

## 2019-12-08 ENCOUNTER — Other Ambulatory Visit: Payer: Self-pay

## 2019-12-08 VITALS — BP 127/68 | HR 80 | Ht 62.0 in | Wt 121.0 lb

## 2019-12-08 DIAGNOSIS — M545 Low back pain, unspecified: Secondary | ICD-10-CM

## 2019-12-08 DIAGNOSIS — M439 Deforming dorsopathy, unspecified: Secondary | ICD-10-CM

## 2019-12-08 NOTE — Progress Notes (Signed)
Established Patient Office Visit  Subjective:  Patient ID: [name] [name], male    DOB: 03-06-1956  Age: 64 y.o. MRN: [id number]  CC:  Chief Complaint  Patient presents with  . Back Pain    Pt. stated he's been having back pain for 3 weeks, and the pain disturbed his sleep.     HPI [name] is 64 year male with a limp and low back pain presents for today for hip and back pain increased in intensity over 3 weeks. His posture is asymmetrical and back curvature.Looked in the mirror until then did not know his posture was leaning.     Past Medical History:  Diagnosis Date  . 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[place]) 12/25/2019  . COPD (chronic obstructive pulmonary disease) ([place]) 11/02/2007   Qualifier: Diagnosis of  By: Melvyn Novas MD, Christena Deem   . Hypertension 12/24/2019  . Iron deficiency anemia 08/23/2007   Qualifier: Diagnosis of  By: Jim Like      History reviewed. No pertinent surgical history.  Family History  Problem Relation Age of Onset  . Hypertension Other     Social History   Socioeconomic History  . Marital status: Single    Spouse name: Not on file  . Number of children: Not on file  . Years of education: Not on file  . Highest education level: Not on file  Occupational History  . Not on file  Tobacco Use  . Smoking status: Current Every Day Smoker    Packs/day: 0.25    Types: Cigarettes  . Smokeless tobacco: Never Used  Substance and Sexual Activity  . Alcohol use: Not Currently  . Drug use: Never  . Sexual activity: Not Currently  Other Topics Concern  . Not on file  Social History Narrative  . Not on file   Social Determinants of Health   Financial Resource Strain:   . Difficulty of Paying Living Expenses:   Food Insecurity:   . Worried About Charity fundraiser in the Last Year:   . Arboriculturist in the Last Year:   Transportation Needs:   . Film/video editor (Medical):   Marland Kitchen Lack of Transportation (Non-Medical):   Physical Activity:    . Days of Exercise per Week:   . Minutes of Exercise per Session:   Stress:   . Feeling of Stress :   Social Connections:   . Frequency of Communication with Friends and Family:   . Frequency of Social Gatherings with Friends and Family:   . Attends Religious Services:   . Active Member of Clubs or Organizations:   . Attends Archivist Meetings:   Marland Kitchen Marital Status:   Intimate Partner Violence:   . Fear of Current or Ex-Partner:   . Emotionally Abused:   Marland Kitchen Physically Abused:   . Sexually Abused:     No facility-administered medications prior to visit.   Outpatient Medications Prior to Visit  Medication Sig Dispense Refill  . aspirin 81 MG EC tablet Take 1 tablet (81 mg total) by mouth daily. 30 tablet 11  . budesonide-formoterol (SYMBICORT) 80-4.5 MCG/ACT inhaler Inhale 2 puffs into the lungs 2 (two) times daily. (Patient taking differently: Inhale 2 puffs into the lungs 2 (two) times daily as needed (wheezing, SOB). ) 1 Inhaler 11  . losartan (COZAAR) 25 MG tablet Take 1 tablet (25 mg total) by mouth daily. 90 tablet 1  . albuterol (VENTOLIN HFA) 108 (90 Base) MCG/ACT inhaler Inhale 2 puffs into the lungs  every 4 (four) hours as needed for wheezing or shortness of breath. 18 g 1    No Known Allergies  Review of Systems  Musculoskeletal: Positive for back pain and gait problem.      Objective:    Physical Exam Vitals reviewed.  HENT:     Head: Normocephalic.     Nose: Nose normal.  Abdominal:     General: Bowel sounds are normal.  Musculoskeletal:        General: Deformity present. Normal range of motion.  Neurological:     Mental Status: He is alert and oriented to person, place, and time.     Gait: Gait abnormal.  Psychiatric:        Mood and Affect: Mood normal.        Behavior: Behavior normal.     BP 127/68 (BP Location: Right Arm, Patient Position: Sitting, Cuff Size: Normal)   Pulse 80   Ht 5\' 2"  (1.575 m)   Wt 121 lb (54.9 kg)   BMI 22.13  kg/m  Wt Readings from Last 3 Encounters:  12/25/19 116 lb 6.5 oz (52.8 kg)  12/08/19 121 lb (54.9 kg)  09/14/19 133 lb 6.4 oz (60.5 kg)     Health Maintenance Due  Topic Date Due  . INFLUENZA VACCINE  12/18/2019    There are no preventive care reminders to display for this patient. Lab Results  Component Value Date   WBC 4.5 12/25/2019   HGB 12.8 (L) 12/25/2019   HCT 39.3 12/25/2019   MCV 71.6 (L) 12/25/2019   PLT 395 12/25/2019   Lab Results  Component Value Date   NA 126 (L) 12/25/2019   K 4.3 12/25/2019   CO2 22 12/25/2019   GLUCOSE 80 12/25/2019   BUN <5 (L) 12/25/2019   CREATININE 0.66 12/25/2019   BILITOT 0.6 12/25/2019   ALKPHOS 56 12/25/2019   AST 17 12/25/2019   ALT 10 12/25/2019   PROT 6.6 12/25/2019   ALBUMIN 3.4 (L) 12/25/2019   CALCIUM 9.0 12/25/2019   ANIONGAP 11 12/25/2019   Lab Results  Component Value Date   CHOL 195 08/03/2019   Lab Results  Component Value Date   HDL 65 08/03/2019   Lab Results  Component Value Date   LDLCALC 109 (H) 08/03/2019   Lab Results  Component Value Date   TRIG 119 08/03/2019   Lab Results  Component Value Date   CHOLHDL 3.0 08/03/2019   Lab Results  Component Value Date   HGBA1C 5.6 05/31/2018      Assessment & Plan:  Diamante was seen today for back pain.  Diagnoses and all orders for this visit:  Acquired curvature of spine Maybe under lining cause of also, hip pain and back pain   Low back pain without sciatica, unspecified back pain laterality, unspecified chronicity  BACK PAIN  Location: lumbar/sacral Quality: aching, heaviness and pressure Onset: gradual Worse with: standing and walking       Better with: rest  Radiation: no Trauma: no Best sitting/standing/leaning forward: yes   Red Flags Fecal/urinary incontinence: no  Numbness/Weakness: yes  Fever/chills/sweats: yes  Night pain: yes  Unexplained weight loss: no  No relief with bedrest: yes  h/o cancer/immunosuppression: no   IV drug use: no  PMH of osteoporosis or chronic steroid use: no   No orders of the defined types were placed in this encounter.   Follow-up: Return discuss need for [facility] assisstance and or [facility] to refer to speciality.    Sharyn Lull  Harriette Ohara, NP

## 2019-12-24 ENCOUNTER — Inpatient Hospital Stay (HOSPITAL_COMMUNITY)
Admission: EM | Admit: 2019-12-24 | Discharge: 2019-12-28 | DRG: 167 | Disposition: A | Discharge status: Home / Self Care | Payer: Self-pay | Attending: Internal Medicine | Admitting: Internal Medicine

## 2019-12-24 ENCOUNTER — Other Ambulatory Visit: Payer: Self-pay

## 2019-12-24 ENCOUNTER — Emergency Department (HOSPITAL_COMMUNITY): Payer: Self-pay

## 2019-12-24 ENCOUNTER — Encounter (HOSPITAL_COMMUNITY): Payer: Self-pay | Admitting: Emergency Medicine

## 2019-12-24 DIAGNOSIS — Z79899 Other long term (current) drug therapy: Secondary | ICD-10-CM

## 2019-12-24 DIAGNOSIS — G4700 Insomnia, unspecified: Secondary | ICD-10-CM | POA: Diagnosis present

## 2019-12-24 DIAGNOSIS — D509 Iron deficiency anemia, unspecified: Secondary | ICD-10-CM | POA: Diagnosis present

## 2019-12-24 DIAGNOSIS — Z8249 Family history of ischemic heart disease and other diseases of the circulatory system: Secondary | ICD-10-CM

## 2019-12-24 DIAGNOSIS — J439 Emphysema, unspecified: Secondary | ICD-10-CM | POA: Diagnosis present

## 2019-12-24 DIAGNOSIS — I10 Essential (primary) hypertension: Secondary | ICD-10-CM | POA: Diagnosis present

## 2019-12-24 DIAGNOSIS — R824 Acetonuria: Secondary | ICD-10-CM | POA: Diagnosis present

## 2019-12-24 DIAGNOSIS — Z7951 Long term (current) use of inhaled steroids: Secondary | ICD-10-CM

## 2019-12-24 DIAGNOSIS — M549 Dorsalgia, unspecified: Secondary | ICD-10-CM

## 2019-12-24 DIAGNOSIS — F17210 Nicotine dependence, cigarettes, uncomplicated: Secondary | ICD-10-CM | POA: Diagnosis present

## 2019-12-24 DIAGNOSIS — R64 Cachexia: Secondary | ICD-10-CM | POA: Diagnosis present

## 2019-12-24 DIAGNOSIS — Z7982 Long term (current) use of aspirin: Secondary | ICD-10-CM

## 2019-12-24 DIAGNOSIS — E222 Syndrome of inappropriate secretion of antidiuretic hormone: Secondary | ICD-10-CM | POA: Diagnosis present

## 2019-12-24 DIAGNOSIS — E871 Hypo-osmolality and hyponatremia: Secondary | ICD-10-CM | POA: Diagnosis present

## 2019-12-24 DIAGNOSIS — Z720 Tobacco use: Secondary | ICD-10-CM | POA: Diagnosis present

## 2019-12-24 DIAGNOSIS — N400 Enlarged prostate without lower urinary tract symptoms: Secondary | ICD-10-CM | POA: Diagnosis present

## 2019-12-24 DIAGNOSIS — R823 Hemoglobinuria: Secondary | ICD-10-CM | POA: Diagnosis present

## 2019-12-24 DIAGNOSIS — E440 Moderate protein-calorie malnutrition: Secondary | ICD-10-CM | POA: Insufficient documentation

## 2019-12-24 DIAGNOSIS — J398 Other specified diseases of upper respiratory tract: Secondary | ICD-10-CM | POA: Diagnosis present

## 2019-12-24 DIAGNOSIS — R918 Other nonspecific abnormal finding of lung field: Secondary | ICD-10-CM

## 2019-12-24 DIAGNOSIS — E860 Dehydration: Secondary | ICD-10-CM | POA: Diagnosis present

## 2019-12-24 DIAGNOSIS — C383 Malignant neoplasm of mediastinum, part unspecified: Principal | ICD-10-CM | POA: Diagnosis present

## 2019-12-24 DIAGNOSIS — J449 Chronic obstructive pulmonary disease, unspecified: Secondary | ICD-10-CM | POA: Diagnosis present

## 2019-12-24 DIAGNOSIS — Z20822 Contact with and (suspected) exposure to covid-19: Secondary | ICD-10-CM | POA: Diagnosis present

## 2019-12-24 DIAGNOSIS — R590 Localized enlarged lymph nodes: Secondary | ICD-10-CM | POA: Diagnosis present

## 2019-12-24 DIAGNOSIS — Z6821 Body mass index (BMI) 21.0-21.9, adult: Secondary | ICD-10-CM

## 2019-12-24 HISTORY — DX: Essential (primary) hypertension: I10

## 2019-12-24 LAB — BASIC METABOLIC PANEL
Anion gap: 10 (ref 5–15)
BUN: 5 mg/dL — ABNORMAL LOW (ref 8–23)
CO2: 24 mmol/L (ref 22–32)
Calcium: 9.3 mg/dL (ref 8.9–10.3)
Chloride: 90 mmol/L — ABNORMAL LOW (ref 98–111)
Creatinine, Ser: 0.59 mg/dL — ABNORMAL LOW (ref 0.61–1.24)
GFR calc Af Amer: >60 mL/min (ref >60)
GFR calc non Af Amer: >60 mL/min (ref >60)
Glucose, Bld: 86 mg/dL (ref 70–99)
Potassium: 4.1 mmol/L (ref 3.5–5.1)
Sodium: 124 mmol/L — ABNORMAL LOW (ref 135–145)

## 2019-12-24 LAB — URINALYSIS, ROUTINE W REFLEX MICROSCOPIC
Bacteria, UA: NONE SEEN
Bilirubin Urine: NEGATIVE
Glucose, UA: NEGATIVE mg/dL
Ketones, ur: 5 mg/dL — AB
Nitrite: NEGATIVE
Protein, ur: NEGATIVE mg/dL
Specific Gravity, Urine: 1.043 — ABNORMAL HIGH (ref 1.005–1.030)
pH: 6 (ref 5.0–8.0)

## 2019-12-24 LAB — CBC
HCT: 43.1 % (ref 39.0–52.0)
Hemoglobin: 13.8 g/dL (ref 13.0–17.0)
MCH: 22.8 pg — ABNORMAL LOW (ref 26.0–34.0)
MCHC: 32 g/dL (ref 30.0–36.0)
MCV: 71.4 fL — ABNORMAL LOW (ref 80.0–100.0)
Platelets: 434 10*3/uL — ABNORMAL HIGH (ref 150–400)
RBC: 6.04 MIL/uL — ABNORMAL HIGH (ref 4.22–5.81)
RDW: 14.7 % (ref 11.5–15.5)
WBC: 5.8 10*3/uL (ref 4.0–10.5)
nRBC: 0 % (ref 0.0–0.2)

## 2019-12-24 LAB — TROPONIN I (HIGH SENSITIVITY)
Troponin I (High Sensitivity): <2 ng/L (ref <18)
Troponin I (High Sensitivity): <2 ng/L (ref <18)

## 2019-12-24 LAB — SODIUM, URINE, RANDOM: Sodium, Ur: 76 mmol/L

## 2019-12-24 LAB — CREATININE, URINE, RANDOM: Creatinine, Urine: 149.85 mg/dL

## 2019-12-24 MED ORDER — ACETAMINOPHEN 325 MG PO TABS
650.0000 mg | ORAL_TABLET | Freq: Four times a day (QID) | ORAL | Status: DC | PRN
  Administered 2019-12-26: 650 mg via ORAL
  Filled 2019-12-24: qty 2

## 2019-12-24 MED ORDER — SODIUM CHLORIDE (PF) 0.9 % IJ SOLN
INTRAMUSCULAR | Status: AC
  Filled 2019-12-24: qty 50

## 2019-12-24 MED ORDER — ONDANSETRON HCL 4 MG PO TABS: 4.0000 mg | ORAL_TABLET | Freq: Four times a day (QID) | ORAL | Status: DC | PRN

## 2019-12-24 MED ORDER — ONDANSETRON HCL 4 MG/2ML IJ SOLN: 4.0000 mg | Freq: Four times a day (QID) | INTRAMUSCULAR | Status: DC | PRN

## 2019-12-24 MED ORDER — ACETAMINOPHEN 650 MG RE SUPP: 650.0000 mg | Freq: Four times a day (QID) | RECTAL | Status: DC | PRN

## 2019-12-24 MED ORDER — SODIUM CHLORIDE 0.9 % IV SOLN: INTRAVENOUS | Status: DC

## 2019-12-24 MED ORDER — OXYCODONE-ACETAMINOPHEN 5-325 MG PO TABS
1.0000 | ORAL_TABLET | Freq: Once | ORAL | Status: AC
  Administered 2019-12-24: 1 via ORAL
  Filled 2019-12-24: qty 1

## 2019-12-24 MED ORDER — IOHEXOL 300 MG/ML  SOLN
100.0000 mL | Freq: Once | INTRAMUSCULAR | Status: AC | PRN
  Administered 2019-12-24: 100 mL via INTRAVENOUS

## 2019-12-24 MED ORDER — HYDROMORPHONE HCL 1 MG/ML IJ SOLN
1.0000 mg | INTRAMUSCULAR | Status: DC | PRN
  Administered 2019-12-27: 08:00:00 1 mg via INTRAVENOUS
  Filled 2019-12-24 (×2): qty 1

## 2019-12-24 NOTE — ED Triage Notes (Signed)
Patient here from home reporting lower back pain that started 1 month ago. Denies injury. "I just woke up like that".

## 2019-12-24 NOTE — H&P (Signed)
History and Physical    [name] [name] ZMO:[id number] DOB: [birth year] DOA: 12/24/2019  PCP: [name], NP   Patient coming from: Home.  I have personally briefly reviewed patient's old medical records in [place]  Chief Complaint: Back pain.  HPI: [name] [name] is a 64 y.o. male with medical history significant of COPD emphysema, tobacco use, hypertension, history of iron deficiency anemia with normal hemoglobin now, but still showing microcytosis, hypertension who is coming to the emergency department with complaints of right shoulder and thoracic back pain for the past 3 to 4 weeks associated with occasional dry cough that produces pleuritic chest wall pain. He also states that his appetite is decreased and he has been having trouble sleeping. He denies fever, chills, night sweats or hemoptysis. He denies wheezing or productive cough. No palpitations, dizziness, diaphoresis, PND, orthopnea or lower extremity edema. No abdominal pain, nausea, emesis, diarrhea, constipation, melena or hematochezia. Denies dysuria, frequency hematuria. Denies polyuria, polydipsia, polyphagia or blurred vision.  ED Course: Initial vital signs were heart rate of 86 respirations 16, blood pressure 120/75 mmHg O2 sat 99% on room air. In the ED. He was afebrile. The patient received a tablet of Percocet 5/325 mg.  His urinalysis showing [place]acy specific gravity over 1.043, small hemoglobinuria, ketonuria 5 mg/dL and trace leukocyte esterase. CBC showed white count of 5.8, hemoglobin 13.8 g/dL and platelets 434. Troponin x2 normal. Sodium was 124, potassium 4.1, chloride 90 and CO2 24 mmol/L. Glucose 86, BUN 5 and creatinine 0.59 mg/dL. His calcium is 9.3 mg/dL.  Imaging: Lumbar and thoracic spine showed degenerative changes. Chest radiograph was significant for tracheal mass, which was further characterized on CT chest. Please see images and full radiology report for further detail.  Review of  Systems: As per HPI otherwise all other systems reviewed and are negative.  Past Medical History:  Diagnosis Date  . COPD (chronic obstructive pulmonary disease) ([place]) 11/02/2007   Qualifier: Diagnosis of  By: Melvyn Novas MD, Christena Deem   . Hypertension 12/24/2019  . Iron deficiency anemia 08/23/2007   Qualifier: Diagnosis of  By: Jim Like      History reviewed. No pertinent surgical history.  Social History  reports that he has been smoking cigarettes. He has been smoking about 0.25 packs per day. He has never used smokeless tobacco. He reports previous alcohol use. He reports that he does not use drugs.  No Known Allergies  Family medical history Several members with hypertension.  Prior to Admission medications   Medication Sig Start Date End Date Taking? Authorizing Provider  acetaminophen (TYLENOL) 500 MG tablet Take 500 mg by mouth every 6 (six) hours as needed for mild pain or moderate pain.   Yes [provider]  albuterol (VENTOLIN HFA) 108 (90 Base) MCG/ACT inhaler Inhale 2 puffs into the lungs every 4 (four) hours as needed for wheezing or shortness of breath. 08/03/19 12/24/19 Yes [name], NP  aspirin 81 MG EC tablet Take 1 tablet (81 mg total) by mouth daily. 04/27/18  Yes Clent Demark, PA-C  budesonide-formoterol [facility]) 80-4.5 MCG/ACT inhaler Inhale 2 puffs into the lungs 2 (two) times daily. Patient taking differently: Inhale 2 puffs into the lungs 2 (two) times daily as needed (wheezing, SOB).  08/03/19  Yes [name], NP  losartan (COZAAR) 25 MG tablet Take 1 tablet (25 mg total) by mouth daily. 08/03/19  Yes [name], NP  Multiple Vitamin (MULTIVITAMIN) tablet Take 1 tablet by mouth daily.  Yes [provider]    Physical Exam: Vitals:   12/24/19 1834 12/24/19 1930 12/24/19 1945 12/24/19 2000  BP: (!) 156/82 (!) 143/101 (!) 138/59 140/68  Pulse: 92 91 92 85  Resp: 19 (!) 29 (!) 25 (!) 21  Temp:        TempSrc:      SpO2: 100% 98% 98% 97%   Constitutional: Looks chronically ill, but comfortable at this time Eyes: PERRL, lids and conjunctivae normal ENMT: Mucous membranes are moist. Posterior pharynx clear of any exudate or lesions. Neck: normal, supple, no masses, no thyromegaly Respiratory: clear to auscultation bilaterally, no wheezing, no crackles. Normal respiratory effort. No accessory muscle use. The patient complained of pleuritic CP on deep inspiration. Cardiovascular: Regular rate and rhythm, no murmurs / rubs / gallops. No extremity edema. 2+ pedal pulses. No carotid bruits.  Abdomen: Nondistended. BS positive. Soft, no tenderness, no masses palpated. No hepatosplenomegaly.  Musculoskeletal: no clubbing / cyanosis. Good ROM, no contractures. Normal muscle tone.  Skin: no rashes, lesions, ulcers on limited dermatological examination. Neurologic: CN 2-12 grossly intact. Sensation intact, DTR normal. Strength 5/5 in all 4.  Psychiatric: Normal judgment and insight. Alert and oriented x 3. Normal mood.   Labs on Admission: I have personally reviewed following labs and imaging studies  CBC: Recent Labs  Lab 12/24/19 1626  WBC 5.8  HGB 13.8  HCT 43.1  MCV 71.4*  PLT 434*    Basic Metabolic Panel: Recent Labs  Lab 12/24/19 1626  NA 124*  K 4.1  CL 90*  CO2 24  GLUCOSE 86  BUN 5*  CREATININE 0.59*  CALCIUM 9.3    GFR: CrCl cannot be calculated (Unknown ideal weight.).  Liver Function Tests: No results for input(s): AST, ALT, ALKPHOS, BILITOT, PROT, ALBUMIN in the last 168 hours.  Urine analysis:    Component Value Date/Time   COLORURINE YELLOW 12/24/2019 1953   APPEARANCEUR CLEAR 12/24/2019 1953   LABSPEC 1.043 (H) 12/24/2019 1953   PHURINE 6.0 12/24/2019 1953   GLUCOSEU NEGATIVE 12/24/2019 1953   HGBUR SMALL (A) 12/24/2019 1953   BILIRUBINUR NEGATIVE 12/24/2019 1953   KETONESUR 5 (A) 12/24/2019 1953   PROTEINUR NEGATIVE 12/24/2019 1953   UROBILINOGEN  1.0 05/28/2008 1859   NITRITE NEGATIVE 12/24/2019 1953   LEUKOCYTESUR TRACE (A) 12/24/2019 1953    Radiological Exams on Admission: DG Chest 2 View  Result Date: 12/24/2019 CLINICAL DATA:  Upper back pain. EXAM: CHEST - 2 VIEW COMPARISON:  10/22/2017 FINDINGS: Heart is normal size. Large soft tissue mass in the right paratracheal region and medial left lower lobe. Left lung clear. No effusions or acute bony abnormality. IMPRESSION: Large right paratracheal soft tissue mass. Recommend further evaluation with chest CT with IV contrast. Electronically Signed   By: Rolm Baptise M.D.   On: 12/24/2019 17:36   DG Cervical Spine Complete  Result Date: 12/24/2019 CLINICAL DATA:  Mid upper back pain.  No known injury. EXAM: CERVICAL SPINE - COMPLETE 4+ VIEW COMPARISON:  None. FINDINGS: Degenerative disc disease changes with disc space narrowing and anterior spurring. Bilateral degenerative facet disease. Severe bilateral neural foraminal narrowing at C4-5. No fracture or malalignment. Prevertebral soft tissues are normal. IMPRESSION: Degenerative disc and facet disease as above. No acute bony abnormality. Electronically Signed   By: Rolm Baptise M.D.   On: 12/24/2019 17:35   DG Thoracic Spine 2 View  Result Date: 12/24/2019 CLINICAL DATA:  Chest pain, upper back pain EXAM: THORACIC SPINE 2 VIEWS COMPARISON:  10/22/2017 FINDINGS: Soft tissue mass noted in the right paratracheal region. This is new since prior study. No acute bony abnormality. No fracture or malalignment. No focal bone lesion. IMPRESSION: No acute bony abnormality. Large right paratracheal soft tissue mass. Recommend chest CT with IV contrast for further evaluation. Electronically Signed   By: Rolm Baptise M.D.   On: 12/24/2019 17:35   CT CHEST ABDOMEN PELVIS W CONTRAST  Result Date: 12/24/2019 CLINICAL DATA:  Right paratracheal soft tissue mass found on chest x-ray. EXAM: CT CHEST, ABDOMEN, AND PELVIS WITH CONTRAST TECHNIQUE: Multidetector CT  imaging of the chest, abdomen and pelvis was performed following the standard protocol during bolus administration of intravenous contrast. CONTRAST:  16mL OMNIPAQUE IOHEXOL 300 MG/ML  SOLN COMPARISON:  May 30, 2008 FINDINGS: CT CHEST FINDINGS Cardiovascular: No significant vascular findings. Normal heart size. No pericardial effusion. Mediastinum/Nodes: A 6.7 cm x 6.8 cm x 8.6 cm lobulated heterogeneous low-attenuation right paratracheal soft tissue mass is seen. This extends from the right apex to the right suprahilar and precarinal regions. Thyroid gland, trachea, and esophagus demonstrate no significant findings. Lungs/Pleura: The lungs are hyperinflated. Moderate severity bullous changes are seen along the anterior aspect of the mid and upper left lung. Adjacent 6 mm and 4 mm noncalcified lung nodule is seen within the anterior aspect of the left upper lobe. There is no evidence of acute infiltrate, pleural effusion or pneumothorax. Musculoskeletal: No acute osseous abnormalities are identified. CT ABDOMEN PELVIS FINDINGS Hepatobiliary: A 7 mm cystic appearing area is seen within the anterior aspect of the left lobe of the liver. No gallstones, gallbladder wall thickening, or biliary dilatation. Pancreas: Unremarkable. No pancreatic ductal dilatation or surrounding inflammatory changes. Spleen: Normal in size without focal abnormality. Adrenals/Urinary Tract: Adrenal glands are unremarkable. Kidneys are normal, without renal calculi, focal lesion, or hydronephrosis. Bladder is unremarkable. Stomach/Bowel: Stomach is within normal limits. Appendix appears normal. No evidence of bowel wall thickening, distention, or inflammatory changes. Vascular/Lymphatic: There is moderate severity calcification of the abdominal aorta and bilateral common iliac arteries, without evidence of aneurysmal dilatation. No enlarged abdominal or pelvic lymph nodes. Reproductive: There is moderate to marked severity prostate gland  enlargement. Other: No abdominal wall hernia or abnormality. No abdominopelvic ascites. Musculoskeletal: Grade 1 anterolisthesis of the L5 vertebral body on S1 is seen. IMPRESSION: 1. 6.7 cm x 6.8 cm x 8.6 cm lobulated heterogeneous low-attenuation right paratracheal soft tissue mass. This extends from the right apex to the right suprahilar and precarinal regions and is consistent with a neoplastic process. 2. Adjacent 6 mm and 4 mm noncalcified lung nodule within the anterior aspect of the left upper lobe. Correlation with follow-up chest CT at 12 months is recommended. 3. Moderate severity bullous changes along the anterior aspect of the mid and upper left lung. 4. Moderate to marked severity prostate gland enlargement. 5. Grade 1 anterolisthesis of the L5 vertebral body on S1. 6. Small cyst suspected within the left lobe of the liver. Aortic Atherosclerosis (ICD10-I70.0). Electronically Signed   By: Virgina Norfolk M.D.   On: 12/24/2019 19:39    EKG: Independently reviewed.  Vent. rate 81 BPM PR interval * ms QRS duration 86 ms QT/QTc 369/429 ms P-R-T axes 64 46 57 Sinus rhythm Probable anteroseptal infarct, old 12 Lead; Mason-Likar  Assessment/Plan Principal Problem:   Tracheal mass Observation/MedSurg. Hold aspirin today for a day. Analgesics as needed. Pulmonology has been consulted. Will likely need bronchoscopy  Active Problems:   Tobacco use   COPD (chronic  obstructive pulmonary disease) (HCC) Supplemental oxygen as needed. Rescue bronchodilators as needed. Continue Symbicort or formulary equivalent.    Hypertension Holding losartan 25 mg p.o. daily. Monitor blood pressure.    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 Continue normal saline infusion. Consider nephrology evaluation.   DVT prophylaxis: SCDs. Code Status:   Full code. Family Communication: Disposition Plan:   Patient is from:  Home.  Anticipated DC to:  Home.  Anticipated DC  date:  12/26/2019.  Anticipated DC barriers: Clinical and work-up status. Consults called:  EDP contacted PCCM who will evaluate in the morning. Admission status:  Observation/MedSurg.  Severity of Illness: High given new mass with weight loss, pain and hyponatremia highly suspicious for malignancy with a paraneoplastic syndrome.Reubin Milan MD [facility]  How to contact the [facility] Attending or Consulting provider [place] or covering provider during after hours [place], for this patient?   1. Check the care team in [facility] and look for a) attending/consulting [facility] provider listed and b) the [facility] team listed 2. Log into [email or web address] and use [place]'s universal password to access. If you do not have the password, please contact the hospital operator. 3. Locate the [facility] provider you are looking for under [facility] and page to a number that you can be directly reached. 4. If you still have difficulty reaching the provider, please page the [facility] (Director on Call) for the Hospitalists listed on amion for assistance.  12/24/2019, 11:34 PM   This document was prepared using Dragon voice recognition software and may contain some unintended transcription errors.

## 2019-12-24 NOTE — ED Provider Notes (Addendum)
[facility] DEPT Provider Note   CSN: [id number] Arrival date & time: 12/24/19  1345     History Chief Complaint  Patient presents with  . Back Pain    [name] [name] is a 64 y.o. male.  HPI 64 year old male with a history of daily cigarette smoking, COPD, anemia, hypertension, eosinophilia presents to the ER with 3-week history of thoracic back pain and right shoulder pain.  Patient states that he has been having this ongoing pain for the last 3 weeks.  He has been having trouble sleeping which is brought him into the ER.  He endorses a chronic cough, and centralized chest pain as well. States chest pain is worse at night when laying down.  Patient is a difficult historian, not elaborating on his symptoms, states he just hurts all over his chest.  He states he will get some intermittent radiation down into his arm, but none currently.  He does not have any chronic numbness and tingling.  No weakness.  No unintended weight loss, night sweats, fevers, chills.  No history of IVDU.  He has been taking Tylenol for his symptoms with little relief.  Denies any falls, inciting injury. Endorses 5 cigarrettes a day x 40 years.     History reviewed. No pertinent past medical history.  Patient Active Problem List   Diagnosis Date Noted  . CIGARETTE SMOKER 11/02/2007  . COPD UNSPECIFIED 11/02/2007  . ANEMIA, IRON DEFICIENCY, MICROCYTIC 08/23/2007  . PULMONARY INFILTRATE INCLUDES (EOSINOPHILIA) 08/23/2007    History reviewed. No pertinent surgical history.     No family history on file.  Social History   Tobacco Use  . Smoking status: Current Every Day Smoker    Packs/day: 0.25    Types: Cigarettes  . Smokeless tobacco: Never Used  Substance Use Topics  . Alcohol use: Not Currently  . Drug use: Never    Home Medications Prior to Admission medications   Medication Sig Start Date End Date Taking? Authorizing Provider  albuterol (VENTOLIN HFA) 108 (90  Base) MCG/ACT inhaler Inhale 2 puffs into the lungs every 4 (four) hours as needed for wheezing or shortness of breath. 08/03/19 09/02/19  [name], NP  aspirin 81 MG EC tablet Take 1 tablet (81 mg total) by mouth daily. 04/27/18   Clent Demark, PA-C  budesonide-formoterol [facility]) 80-4.5 MCG/ACT inhaler Inhale 2 puffs into the lungs 2 (two) times daily. 08/03/19   [name], NP  losartan (COZAAR) 25 MG tablet Take 1 tablet (25 mg total) by mouth daily. 08/03/19   [name], NP    Allergies    Patient has no known allergies.  Review of Systems   Review of Systems  Physical Exam Updated Vital Signs BP 120/75 (BP Location: Right Arm)   Pulse 86   Temp 98.4 F (36.9 C) (Oral)   Resp 16   SpO2 99%   Physical Exam Vitals and nursing note reviewed.  Constitutional:      Appearance: Normal appearance. He is well-developed. He is not ill-appearing.  HENT:     Head: Normocephalic and atraumatic.  Eyes:     Conjunctiva/sclera: Conjunctivae normal.  Cardiovascular:     Rate and Rhythm: Normal rate and regular rhythm.     Heart sounds: No murmur heard.   Pulmonary:     Effort: Pulmonary effort is normal. No respiratory distress.     Breath sounds: Normal breath sounds.     Comments: Chest wall tender to palpation, pain  reproducible bilaterally Chest:     Chest wall: Tenderness present.  Abdominal:     General: Abdomen is flat.     Palpations: Abdomen is soft.     Tenderness: There is no abdominal tenderness.  Musculoskeletal:        General: No swelling or tenderness. Normal range of motion.     Cervical back: Neck supple.     Comments: Midline tenderness to the C7 and thoracic spine.  No L-spine tenderness.  No noticeable step-offs or crepitus.  5/5 strength in the upper extremities, equal grip strength, intact flexion and extension.  Gross sensations intact.  Moving neck without difficulty.  Skin:    General: Skin is warm and dry.      Findings: No erythema.  Neurological:     General: No focal deficit present.     Mental Status: He is alert and oriented to person, place, and time.  Psychiatric:        Mood and Affect: Mood normal.        Behavior: Behavior normal.     ED Results / Procedures / Treatments   Labs (all labs ordered are listed, but only abnormal results are displayed) Labs Reviewed  CBC - Abnormal; Notable for the following components:      Result Value   RBC 6.04 (*)    MCV 71.4 (*)    MCH 22.8 (*)    Platelets 434 (*)    All other components within normal limits  BASIC METABOLIC PANEL - Abnormal; Notable for the following components:   Sodium 124 (*)    Chloride 90 (*)    BUN 5 (*)    Creatinine, Ser 0.59 (*)    All other components within normal limits  CREATININE, URINE, RANDOM  SODIUM, URINE, RANDOM  URINALYSIS, ROUTINE W REFLEX MICROSCOPIC  TROPONIN I (HIGH SENSITIVITY)    EKG None  Radiology DG Chest 2 View  Result Date: 12/24/2019 CLINICAL DATA:  Upper back pain. EXAM: CHEST - 2 VIEW COMPARISON:  10/22/2017 FINDINGS: Heart is normal size. Large soft tissue mass in the right paratracheal region and medial left lower lobe. Left lung clear. No effusions or acute bony abnormality. IMPRESSION: Large right paratracheal soft tissue mass. Recommend further evaluation with chest CT with IV contrast. Electronically Signed   By: Rolm Baptise M.D.   On: 12/24/2019 17:36   DG Cervical Spine Complete  Result Date: 12/24/2019 CLINICAL DATA:  Mid upper back pain.  No known injury. EXAM: CERVICAL SPINE - COMPLETE 4+ VIEW COMPARISON:  None. FINDINGS: Degenerative disc disease changes with disc space narrowing and anterior spurring. Bilateral degenerative facet disease. Severe bilateral neural foraminal narrowing at C4-5. No fracture or malalignment. Prevertebral soft tissues are normal. IMPRESSION: Degenerative disc and facet disease as above. No acute bony abnormality. Electronically Signed   By: Rolm Baptise M.D.   On: 12/24/2019 17:35   DG Thoracic Spine 2 View  Result Date: 12/24/2019 CLINICAL DATA:  Chest pain, upper back pain EXAM: THORACIC SPINE 2 VIEWS COMPARISON:  10/22/2017 FINDINGS: Soft tissue mass noted in the right paratracheal region. This is new since prior study. No acute bony abnormality. No fracture or malalignment. No focal bone lesion. IMPRESSION: No acute bony abnormality. Large right paratracheal soft tissue mass. Recommend chest CT with IV contrast for further evaluation. Electronically Signed   By: Rolm Baptise M.D.   On: 12/24/2019 17:35    Procedures Procedures (including critical care time)  Medications Ordered in ED Medications -  No data to display  ED Course  I have reviewed the triage vital signs and the nursing notes.  Pertinent labs & imaging results that were available during my care of the patient were reviewed by me and considered in my medical decision making (see chart for details).    MDM Rules/Calculators/A&P                         64 year old male with thoracic back pain, right shoulder pain, chest wall pain x 3 weeks On presentation, the patient is thin appearing, in no acute respiratory distress, speaking full sentences, nontoxic appearing, diaphoretic.  Vitals overall reassuring.  Physical exam with midline tenderness to the C7 and T-spine.  No noticeable step-offs or crepitus.  Strength and sensations intact in upper extremities.  Patient endorses chest pain, but difficult to get additional formation as he is not willing to elaborate.  He does have reproducible chest wall tenderness.  Lung sounds clear.  Will start with basic lab work, troponin, plain films of the C, T and chest 2 view.  DDx includes ACS, PE, pneumonia, lung carcinoma given his longstanding history of smoking, arthritic changes.  LABS: CBC without leukocytosis Slightly increased platelet count of 434.  Normal hemoglobin.  BMP w/hyponatremia of 124, normal 4 months ago.  Hypochloremia of 90. Creatinine 0.59 which is slightly improved from 4 months ago.  Troponin pending  Concern for PE low at this time, patient is not complaining of SOB, vitals reassuring. No risk factors.   IMAGING:  Cervical plainfims with degenerative disc disease.  Thoracic and chest x-ray with large paratracheal mass on the right, recommend with CT imaging. Ordered CT chest abdomen pelvis with contrast to further evaluation.   MDM: Pt w/ hyponatremia of 124, large right paratracheal mass seen on thoracic and chest x-ray films.  Discussed with Dr. Roslynn Amble who will see and evaluate the patient.  Concern for neoplasm.  Hyponatremia likely secondary to this.  Signed out to Dr. Roslynn Amble who will oversee his CT chest abdomen pelvis. Anticipate he will need admission for management of hyponatremia and further workup of the mass.     Final Clinical Impression(s) / ED Diagnoses Final diagnoses:  Back pain    Rx / DC Orders ED Discharge Orders    None           Garald Balding, PA-C 12/24/19 1757    Lucrezia Starch, MD 12/25/19 [id number]

## 2019-12-24 NOTE — ED Notes (Signed)
Pt transported to CT ?

## 2019-12-25 ENCOUNTER — Encounter (HOSPITAL_COMMUNITY): Payer: Self-pay | Admitting: Internal Medicine

## 2019-12-25 DIAGNOSIS — R918 Other nonspecific abnormal finding of lung field: Secondary | ICD-10-CM

## 2019-12-25 DIAGNOSIS — Z720 Tobacco use: Secondary | ICD-10-CM

## 2019-12-25 DIAGNOSIS — J439 Emphysema, unspecified: Secondary | ICD-10-CM

## 2019-12-25 DIAGNOSIS — E871 Hypo-osmolality and hyponatremia: Secondary | ICD-10-CM

## 2019-12-25 DIAGNOSIS — J438 Other emphysema: Secondary | ICD-10-CM

## 2019-12-25 DIAGNOSIS — J398 Other specified diseases of upper respiratory tract: Secondary | ICD-10-CM

## 2019-12-25 HISTORY — DX: Emphysema, unspecified: J43.9

## 2019-12-25 LAB — COMPREHENSIVE METABOLIC PANEL
ALT: 10 U/L (ref 0–44)
AST: 17 U/L (ref 15–41)
Albumin: 3.4 g/dL — ABNORMAL LOW (ref 3.5–5.0)
Alkaline Phosphatase: 56 U/L (ref 38–126)
Anion gap: 11 (ref 5–15)
BUN: <5 mg/dL — ABNORMAL LOW (ref 8–23)
CO2: 22 mmol/L (ref 22–32)
Calcium: 9 mg/dL (ref 8.9–10.3)
Chloride: 93 mmol/L — ABNORMAL LOW (ref 98–111)
Creatinine, Ser: 0.66 mg/dL (ref 0.61–1.24)
GFR calc Af Amer: >60 mL/min (ref >60)
GFR calc non Af Amer: >60 mL/min (ref >60)
Glucose, Bld: 80 mg/dL (ref 70–99)
Potassium: 4.3 mmol/L (ref 3.5–5.1)
Sodium: 126 mmol/L — ABNORMAL LOW (ref 135–145)
Total Bilirubin: 0.6 mg/dL (ref 0.3–1.2)
Total Protein: 6.6 g/dL (ref 6.5–8.1)

## 2019-12-25 LAB — CBC
HCT: 39.3 % (ref 39.0–52.0)
Hemoglobin: 12.8 g/dL — ABNORMAL LOW (ref 13.0–17.0)
MCH: 23.3 pg — ABNORMAL LOW (ref 26.0–34.0)
MCHC: 32.6 g/dL (ref 30.0–36.0)
MCV: 71.6 fL — ABNORMAL LOW (ref 80.0–100.0)
Platelets: 395 10*3/uL (ref 150–400)
RBC: 5.49 MIL/uL (ref 4.22–5.81)
RDW: 14.4 % (ref 11.5–15.5)
WBC: 4.5 10*3/uL (ref 4.0–10.5)
nRBC: 0 % (ref 0.0–0.2)

## 2019-12-25 LAB — PROTIME-INR
INR: 1.1 (ref 0.8–1.2)
Prothrombin Time: 13.6 seconds (ref 11.4–15.2)

## 2019-12-25 LAB — SARS CORONAVIRUS 2 BY RT PCR (HOSPITAL ORDER, PERFORMED IN ~~LOC~~ HOSPITAL LAB): SARS Coronavirus 2: NEGATIVE

## 2019-12-25 LAB — HIV ANTIBODY (ROUTINE TESTING W REFLEX): HIV Screen 4th Generation wRfx: NONREACTIVE

## 2019-12-25 MED ORDER — IPRATROPIUM-ALBUTEROL 0.5-2.5 (3) MG/3ML IN SOLN: 3.0000 mL | Freq: Four times a day (QID) | RESPIRATORY_TRACT | Status: DC | PRN

## 2019-12-25 MED ORDER — ASPIRIN EC 81 MG PO TBEC: 81.0000 mg | DELAYED_RELEASE_TABLET | Freq: Every day | ORAL | Status: DC

## 2019-12-25 MED ORDER — GUAIFENESIN ER 600 MG PO TB12
600.0000 mg | ORAL_TABLET | Freq: Two times a day (BID) | ORAL | Status: DC
  Administered 2019-12-25 – 2019-12-28 (×5): 600 mg via ORAL
  Filled 2019-12-25 (×5): qty 1

## 2019-12-25 MED ORDER — SENNOSIDES-DOCUSATE SODIUM 8.6-50 MG PO TABS
1.0000 | ORAL_TABLET | Freq: Every day | ORAL | Status: DC
  Administered 2019-12-25 – 2019-12-27 (×3): 1 via ORAL
  Filled 2019-12-25 (×3): qty 1

## 2019-12-25 MED ORDER — OXYCODONE-ACETAMINOPHEN 5-325 MG PO TABS
1.0000 | ORAL_TABLET | Freq: Four times a day (QID) | ORAL | Status: DC | PRN
  Administered 2019-12-25 – 2019-12-28 (×4): 1 via ORAL
  Filled 2019-12-25 (×4): qty 1

## 2019-12-25 MED ORDER — HYDROCODONE-HOMATROPINE 5-1.5 MG/5ML PO SYRP: 5.0000 mL | ORAL_SOLUTION | Freq: Four times a day (QID) | ORAL | Status: DC | PRN

## 2019-12-25 MED ORDER — ASPIRIN EC 81 MG PO TBEC
81.0000 mg | DELAYED_RELEASE_TABLET | Freq: Every day | ORAL | Status: DC
  Administered 2019-12-28: 81 mg via ORAL
  Filled 2019-12-25: qty 1

## 2019-12-25 MED ORDER — MOMETASONE FURO-FORMOTEROL FUM 100-5 MCG/ACT IN AERO
2.0000 | INHALATION_SPRAY | Freq: Two times a day (BID) | RESPIRATORY_TRACT | Status: DC
  Administered 2019-12-25 – 2019-12-28 (×7): 2 via RESPIRATORY_TRACT
  Filled 2019-12-25: qty 8.8

## 2019-12-25 NOTE — Progress Notes (Signed)
PROGRESS NOTE    [name] [name]  FBP:[id number]  DOB: 10/15/1955  PCP: [name], NP Admit date:12/24/2019 Chief compliant: back/shoulder pain 64 y.o.malewith h/o HTN,COPD/ emphysema, tobacco use,  Presented with complaints of right shoulder and thoracic back pain for 3 to 4 weeks associated with occasional dry cough, pleuritic chest wall pain, loss of appetite and insomnia. ED Course: Afebrile, O2 sat 99% on room air.WBC 5.8, hemoglobin 13.8 g/dL and platelets 434. Troponin x2 normal. Sodium was 124, potassium 4.1, chloride 90 and CO2 24 mmol/L. Glucose 86, BUN 5 and creatinine 0.59 mg/dL. His calcium is 9.3 mg/dL.Imaging: Lumbar and thoracic spine showed degenerative changes. 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 CT chest/abdomen/pelvis is concerning for neoplastic process. Bronchoscopy with biopsy planned as inpatient.  Subjective:  Patient resting comfortably, denies any acute complaints.  Saturating well on room air.  Objective: Vitals:   12/25/19 1927 12/25/19 2128 12/26/19 0451 12/26/19 0718  BP:  130/73 134/72   Pulse:  86 81   Resp:  15 16   Temp:  98.2 F (36.8 C) 98.1 F (36.7 C)   TempSrc:      SpO2: 95% 100% 98% 97%  Weight:      Height:        Intake/Output Summary (Last 24 hours) at 12/26/2019 1316 Last data filed at 12/26/2019 0959 Gross per 24 hour  Intake 2705.04 ml  Output 1650 ml  Net 1055.04 ml   Filed Weights   12/25/19 0141  Weight: 52.8 kg    Physical Examination:  General: Thin built, no acute distress noted Head ENT: Atraumatic normocephalic, PERRLA, neck supple Heart: S1-S2 heard, regular rate and rhythm, no murmurs.  No leg edema noted Lungs: Equal air entry bilaterally, no rhonchi or rales on exam, no accessory muscle use Abdomen: Bowel sounds heard, soft, nontender, nondistended. No organomegaly.  No CVA  tenderness Extremities: No pedal edema.  No cyanosis or clubbing. Neurological: Awake alert oriented x3, no focal weakness or numbness, strength and sensations to crude touch intact Skin: No wounds or rashes.   Data Reviewed: I have personally reviewed following labs and imaging studies  CBC: Recent Labs  Lab 12/24/19 1626 12/25/19 0515  WBC 5.8 4.5  HGB 13.8 12.8*  HCT 43.1 39.3  MCV 71.4* 71.6*  PLT 434* 824   Basic Metabolic Panel: Recent Labs  Lab 12/24/19 1626 12/25/19 0515 12/26/19 0419  NA 124* 126* 128*  K 4.1 4.3 3.8  CL 90* 93* 97*  CO2 24 22 23   GLUCOSE 86 80 99  BUN 5* <5* 6*  CREATININE 0.59* 0.66 0.62  CALCIUM 9.3 9.0 8.5*   GFR: Estimated Creatinine Clearance: 71.5 mL/min (by C-G formula based on SCr of 0.62 mg/dL). Liver Function Tests: Recent Labs  Lab 12/25/19 0515  AST 17  ALT 10  ALKPHOS 56  BILITOT 0.6  PROT 6.6  ALBUMIN 3.4*   No results for input(s): LIPASE, AMYLASE in the last 168 hours. No results for input(s): AMMONIA in the last 168 hours. Coagulation Profile: Recent Labs  Lab 12/25/19 0515  INR 1.1   Cardiac Enzymes: No results for input(s): CKTOTAL, CKMB, CKMBINDEX, TROPONINI in the last 168 hours. BNP (last 3 results) No results for input(s): PROBNP in the last 8760 hours. HbA1C: No results for input(s): HGBA1C in the last 72 hours. CBG: No results for input(s): GLUCAP in the last 168 hours. Lipid Profile:  No results for input(s): CHOL, HDL, LDLCALC, TRIG, CHOLHDL, LDLDIRECT in the last 72 hours. Thyroid Function Tests: No results for input(s): TSH, T4TOTAL, FREET4, T3FREE, THYROIDAB in the last 72 hours. Anemia Panel: No results for input(s): VITAMINB12, FOLATE, FERRITIN, TIBC, IRON, RETICCTPCT in the last 72 hours. Sepsis Labs: No results for input(s): PROCALCITON, LATICACIDVEN in the last 168 hours.  Recent Results (from the past 240 hour(s))  SARS Coronavirus 2 by RT PCR (hospital order, performed in [facility] hospital lab) Nasopharyngeal Nasopharyngeal Swab     Status: None   Collection Time: 12/25/19 12:09 AM   Specimen: Nasopharyngeal Swab  Result Value Ref Range Status   SARS Coronavirus 2 NEGATIVE NEGATIVE Final    Comment: (NOTE) SARS-CoV-2 target nucleic acids are NOT DETECTED.  The SARS-CoV-2 RNA is generally detectable in upper and lower respiratory specimens during the acute phase of infection. The lowest concentration of SARS-CoV-2 viral copies this assay can detect is 250 copies / mL. A negative result does not preclude SARS-CoV-2 infection and should not be used as the sole basis for treatment or other patient management decisions.  A negative result may occur with improper specimen collection / handling, submission of specimen other than nasopharyngeal swab, presence of viral mutation(s) within the areas targeted by this assay, and inadequate number of viral copies (<250 copies / mL). A negative result must be combined with clinical observations, patient history, and epidemiological information.  Fact Sheet for Patients:   StrictlyIdeas.no  Fact Sheet for Healthcare Providers: BankingDealers.co.za  This test is not yet approved or  cleared by the [place] FDA and has been authorized for detection and/or diagnosis of SARS-CoV-2 by FDA under an Emergency Use Authorization (EUA).  This EUA will remain in effect (meaning this test can be used) for the duration of the COVID-19 declaration under Section 564(b)(1) of the Act, 21 U.S.C. section 360bbb-3(b)(1), unless the authorization is terminated or revoked sooner.  Performed at [facility], [place] [address], [place], [place] [address]       Radiology Studies: DG Chest 2 View  Result Date: 12/24/2019 CLINICAL DATA:  Upper back pain. EXAM: CHEST - 2 VIEW COMPARISON:  10/22/2017 FINDINGS: Heart is normal size. Large soft tissue mass in the right  paratracheal region and medial left lower lobe. Left lung clear. No effusions or acute bony abnormality. IMPRESSION: Large right paratracheal soft tissue mass. Recommend further evaluation with chest CT with IV contrast. Electronically Signed   By: Rolm Baptise M.D.   On: 12/24/2019 17:36   DG Cervical Spine Complete  Result Date: 12/24/2019 CLINICAL DATA:  Mid upper back pain.  No known injury. EXAM: CERVICAL SPINE - COMPLETE 4+ VIEW COMPARISON:  None. FINDINGS: Degenerative disc disease changes with disc space narrowing and anterior spurring. Bilateral degenerative facet disease. Severe bilateral neural foraminal narrowing at C4-5. No fracture or malalignment. Prevertebral soft tissues are normal. IMPRESSION: Degenerative disc and facet disease as above. No acute bony abnormality. Electronically Signed   By: Rolm Baptise M.D.   On: 12/24/2019 17:35   DG Thoracic Spine 2 View  Result Date: 12/24/2019 CLINICAL DATA:  Chest pain, upper back pain EXAM: THORACIC SPINE 2 VIEWS COMPARISON:  10/22/2017 FINDINGS: Soft tissue mass noted in the right paratracheal region. This is new since prior study. No acute bony abnormality. No fracture or malalignment. No focal bone lesion. IMPRESSION: No acute bony abnormality. Large right paratracheal soft tissue mass. Recommend chest CT with IV contrast for further evaluation. Electronically  Signed   By: Rolm Baptise M.D.   On: 12/24/2019 17:35   CT CHEST ABDOMEN PELVIS W CONTRAST  Result Date: 12/24/2019 CLINICAL DATA:  Right paratracheal soft tissue mass found on chest x-ray. EXAM: CT CHEST, ABDOMEN, AND PELVIS WITH CONTRAST TECHNIQUE: Multidetector CT imaging of the chest, abdomen and pelvis was performed following the standard protocol during bolus administration of intravenous contrast. CONTRAST:  13mL OMNIPAQUE IOHEXOL 300 MG/ML  SOLN COMPARISON:  May 30, 2008 FINDINGS: CT CHEST FINDINGS Cardiovascular: No significant vascular findings. Normal heart size. No  pericardial effusion. Mediastinum/Nodes: A 6.7 cm x 6.8 cm x 8.6 cm lobulated heterogeneous low-attenuation right paratracheal soft tissue mass is seen. This extends from the right apex to the right suprahilar and precarinal regions. Thyroid gland, trachea, and esophagus demonstrate no significant findings. Lungs/Pleura: The lungs are hyperinflated. Moderate severity bullous changes are seen along the anterior aspect of the mid and upper left lung. Adjacent 6 mm and 4 mm noncalcified lung nodule is seen within the anterior aspect of the left upper lobe. There is no evidence of acute infiltrate, pleural effusion or pneumothorax. Musculoskeletal: No acute osseous abnormalities are identified. CT ABDOMEN PELVIS FINDINGS Hepatobiliary: A 7 mm cystic appearing area is seen within the anterior aspect of the left lobe of the liver. No gallstones, gallbladder wall thickening, or biliary dilatation. Pancreas: Unremarkable. No pancreatic ductal dilatation or surrounding inflammatory changes. Spleen: Normal in size without focal abnormality. Adrenals/Urinary Tract: Adrenal glands are unremarkable. Kidneys are normal, without renal calculi, focal lesion, or hydronephrosis. Bladder is unremarkable. Stomach/Bowel: Stomach is within normal limits. Appendix appears normal. No evidence of bowel wall thickening, distention, or inflammatory changes. Vascular/Lymphatic: There is moderate severity calcification of the abdominal aorta and bilateral common iliac arteries, without evidence of aneurysmal dilatation. No enlarged abdominal or pelvic lymph nodes. Reproductive: There is moderate to marked severity prostate gland enlargement. Other: No abdominal wall hernia or abnormality. No abdominopelvic ascites. Musculoskeletal: Grade 1 anterolisthesis of the L5 vertebral body on S1 is seen. IMPRESSION: 1. 6.7 cm x 6.8 cm x 8.6 cm lobulated heterogeneous low-attenuation right paratracheal soft tissue mass. This extends from the right apex to  the right suprahilar and precarinal regions and is consistent with a neoplastic process. 2. Adjacent 6 mm and 4 mm noncalcified lung nodule within the anterior aspect of the left upper lobe. Correlation with follow-up chest CT at 12 months is recommended. 3. Moderate severity bullous changes along the anterior aspect of the mid and upper left lung. 4. Moderate to marked severity prostate gland enlargement. 5. Grade 1 anterolisthesis of the L5 vertebral body on S1. 6. Small cyst suspected within the left lobe of the liver. Aortic Atherosclerosis (ICD10-I70.0). Electronically Signed   By: Virgina Norfolk M.D.   On: 12/24/2019 19:39      Scheduled Meds: . [START ON 12/27/2019] aspirin EC  81 mg Oral Daily  . feeding supplement (ENSURE ENLIVE)  237 mL Oral BID BM  . guaiFENesin  600 mg Oral BID  . mometasone-formoterol  2 puff Inhalation BID  . multivitamin with minerals  1 tablet Oral Daily  . senna-docusate  1 tablet Oral QHS   Continuous Infusions: . sodium chloride 75 mL/hr at 12/26/19 0600     Assessment/Plan:  1.  Right paratracheal mass: Presenting symptoms of cough, weight loss, lack of appetite, pleuritic pain appeared to be explained by this finding.  Seen by pulmonary and plan for bronchoscopy in a.m.  N.p.o. after midnight.  Cough improving, nebs  as needed.  Will need oncology referral as outpatient.  2.  Hyponatremia: Urine sodium 76, urine creatinine 149, urine specific gravity on presentation was 1.043.  Could be combination of dehydration and SIADH.  Will add on uric acid to morning labs.  Continue IV fluids for now as sodium appears to be steadily improving.  3.  COPD: Severe bullous changes noted on CT scan, stable.  Nebs as needed.  4.  Hypertension: Home med Cozaar been on hold and normotensive.  5.  BPH: Prostrate enlargement noted on CT but asymptomatic.  Follow-up PCP/urology as outpatient.  6.  Microcytic anemia: Hemoglobin stable around 12.8.  7.  Back pain: Could  be secondary to DDD at L5-S1 as noted on imaging studies.  Symptomatic management.  May need PET scan as outpatient.  DVT prophylaxis: SCDs, chemoprophylaxis on hold and plan for bronchoscopy Code Status: Full code Family / Patient Communication: Discussed with patient Disposition Plan:   Status is: Inpatient  Remains inpatient appropriate because:Inpatient level of care appropriate due to severity of illness, IV fluids, close sodium monitoring, plan for bronchoscopy   Dispo: The patient is from: Home              Anticipated d/c is to: Home              Anticipated d/c date is: 1-2 days              Patient currently is not medically stable to d/c.         Time spent: 25 minutes     >50% time spent in discussions with care team and coordination of care.    Guilford Shi, MD [facility] Pager in [place]  If 7PM-7AM, please contact night-coverage [email or web address] 12/26/2019, 1:16 PM

## 2019-12-25 NOTE — Consult Note (Signed)
Name: [name] [name] MRN: [id number] DOB: 09/19/1955    ADMISSION DATE:  12/24/2019 CONSULTATION DATE: 12/25/1998  REFERRING MD : [facility]  CHIEF COMPLAINT: Back neck and chest pain  BRIEF PATIENT DESCRIPTION: 64 year old cachectic male  SIGNIFICANT EVENTS    STUDIES:  CT of the chest is noted   HISTORY OF PRESENT ILLNESS:   64 year old cachectic male who is a lifelong smoker of less than 10 cigarettes a day and is retired. He notes ongoing weight loss and severe back and neck pain over the last several weeks. He denies hemoptysis productive cough fevers chills sweats or any other malaise chest and back pain. He presented to [facility] long hospital underwent CT of the chest which revealed extensive paratracheal mass. Pulmonary critical care was asked to evaluate for possible fiberoptic bronchoscopy. He is amenable to this procedure. It was explained to him in full and he expressed verbal understanding.  PAST MEDICAL HISTORY :   has a past medical history of 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[place]) (12/25/2019), COPD (chronic obstructive pulmonary disease) ([place]) (11/02/2007), Hypertension (12/24/2019), and Iron deficiency anemia (08/23/2007).  has no past surgical history on file. Prior to Admission medications   Medication Sig Start Date End Date Taking? Authorizing Provider  acetaminophen (TYLENOL) 500 MG tablet Take 500 mg by mouth every 6 (six) hours as needed for mild pain or moderate pain.   Yes [provider]  albuterol (VENTOLIN HFA) 108 (90 Base) MCG/ACT inhaler Inhale 2 puffs into the lungs every 4 (four) hours as needed for wheezing or shortness of breath. 08/03/19 12/24/19 Yes [name], NP  aspirin 81 MG EC tablet Take 1 tablet (81 mg total) by mouth daily. 04/27/18  Yes Clent Demark, PA-C  budesonide-formoterol [facility]) 80-4.5 MCG/ACT inhaler Inhale 2 puffs into the lungs 2 (two) times daily. Patient taking differently: Inhale 2 puffs into the lungs 2 (two) times daily as  needed (wheezing, SOB).  08/03/19  Yes [name], NP  losartan (COZAAR) 25 MG tablet Take 1 tablet (25 mg total) by mouth daily. 08/03/19  Yes [name], NP  Multiple Vitamin (MULTIVITAMIN) tablet Take 1 tablet by mouth daily.   Yes [provider]   No Known Allergies  FAMILY HISTORY:  family history includes Hypertension in an other family member. SOCIAL HISTORY:  reports that he has been smoking cigarettes. He has been smoking about 0.25 packs per day. He has never used smokeless tobacco. He reports previous alcohol use. He reports that he does not use drugs.  REVIEW OF SYSTEMS:   10 point review of system taken, please see HPI for positives and negatives.   SUBJECTIVE:  64 year old male in no acute distress at rest VITAL SIGNS: Temp:  [97.9 F (36.6 C)-98.6 F (37 C)] 98.6 F (37 C) (08/08 0517) Pulse Rate:  [68-92] 84 (08/08 0517) Resp:  [14-29] 14 (08/08 0517) BP: (108-156)/(59-101) 124/72 (08/08 0517) SpO2:  [95 %-100 %] 99 % (08/08 0517) Weight:  [52.8 kg] 52.8 kg (08/08 0141)  PHYSICAL EXAMINATION: General: Cachectic male in no acute distress Neuro: Dull affect but otherwise intact HEENT: No JVD or lymphadenopathy is appreciated oral pharynx other than poor dentition is unremarkable Cardiovascular: Heart sounds are regular regular rhythm Lungs: Decreased air movement throughout Abdomen: Soft nontender scaphoid Musculoskeletal: Intact Skin: Warm and dry  Recent Labs  Lab 12/24/19 1626 12/25/19 0515  NA 124* 126*  K 4.1 4.3  CL 90* 93*  CO2 24 22  BUN 5* <5*  CREATININE 0.59* 0.66  GLUCOSE 86 80   Recent Labs  Lab 12/24/19 1626 12/25/19 0515  HGB 13.8 12.8*  HCT 43.1 39.3  WBC 5.8 4.5  PLT 434* 395   DG Chest 2 View  Result Date: 12/24/2019 CLINICAL DATA:  Upper back pain. EXAM: CHEST - 2 VIEW COMPARISON:  10/22/2017 FINDINGS: Heart is normal size. Large soft tissue mass in the right paratracheal region and medial left  lower lobe. Left lung clear. No effusions or acute bony abnormality. IMPRESSION: Large right paratracheal soft tissue mass. Recommend further evaluation with chest CT with IV contrast. Electronically Signed   By: Rolm Baptise M.D.   On: 12/24/2019 17:36   DG Cervical Spine Complete  Result Date: 12/24/2019 CLINICAL DATA:  Mid upper back pain.  No known injury. EXAM: CERVICAL SPINE - COMPLETE 4+ VIEW COMPARISON:  None. FINDINGS: Degenerative disc disease changes with disc space narrowing and anterior spurring. Bilateral degenerative facet disease. Severe bilateral neural foraminal narrowing at C4-5. No fracture or malalignment. Prevertebral soft tissues are normal. IMPRESSION: Degenerative disc and facet disease as above. No acute bony abnormality. Electronically Signed   By: Rolm Baptise M.D.   On: 12/24/2019 17:35   DG Thoracic Spine 2 View  Result Date: 12/24/2019 CLINICAL DATA:  Chest pain, upper back pain EXAM: THORACIC SPINE 2 VIEWS COMPARISON:  10/22/2017 FINDINGS: Soft tissue mass noted in the right paratracheal region. This is new since prior study. No acute bony abnormality. No fracture or malalignment. No focal bone lesion. IMPRESSION: No acute bony abnormality. Large right paratracheal soft tissue mass. Recommend chest CT with IV contrast for further evaluation. Electronically Signed   By: Rolm Baptise M.D.   On: 12/24/2019 17:35   CT CHEST ABDOMEN PELVIS W CONTRAST  Result Date: 12/24/2019 CLINICAL DATA:  Right paratracheal soft tissue mass found on chest x-ray. EXAM: CT CHEST, ABDOMEN, AND PELVIS WITH CONTRAST TECHNIQUE: Multidetector CT imaging of the chest, abdomen and pelvis was performed following the standard protocol during bolus administration of intravenous contrast. CONTRAST:  13mL OMNIPAQUE IOHEXOL 300 MG/ML  SOLN COMPARISON:  May 30, 2008 FINDINGS: CT CHEST FINDINGS Cardiovascular: No significant vascular findings. Normal heart size. No pericardial effusion. Mediastinum/Nodes: A  6.7 cm x 6.8 cm x 8.6 cm lobulated heterogeneous low-attenuation right paratracheal soft tissue mass is seen. This extends from the right apex to the right suprahilar and precarinal regions. Thyroid gland, trachea, and esophagus demonstrate no significant findings. Lungs/Pleura: The lungs are hyperinflated. Moderate severity bullous changes are seen along the anterior aspect of the mid and upper left lung. Adjacent 6 mm and 4 mm noncalcified lung nodule is seen within the anterior aspect of the left upper lobe. There is no evidence of acute infiltrate, pleural effusion or pneumothorax. Musculoskeletal: No acute osseous abnormalities are identified. CT ABDOMEN PELVIS FINDINGS Hepatobiliary: A 7 mm cystic appearing area is seen within the anterior aspect of the left lobe of the liver. No gallstones, gallbladder wall thickening, or biliary dilatation. Pancreas: Unremarkable. No pancreatic ductal dilatation or surrounding inflammatory changes. Spleen: Normal in size without focal abnormality. Adrenals/Urinary Tract: Adrenal glands are unremarkable. Kidneys are normal, without renal calculi, focal lesion, or hydronephrosis. Bladder is unremarkable. Stomach/Bowel: Stomach is within normal limits. Appendix appears normal. No evidence of bowel wall thickening, distention, or inflammatory changes. Vascular/Lymphatic: There is moderate severity calcification of the abdominal aorta and bilateral common iliac arteries, without evidence of aneurysmal dilatation. No enlarged abdominal or pelvic lymph nodes. Reproductive: There is moderate to marked severity prostate gland enlargement.  Other: No abdominal wall hernia or abnormality. No abdominopelvic ascites. Musculoskeletal: Grade 1 anterolisthesis of the L5 vertebral body on S1 is seen. IMPRESSION: 1. 6.7 cm x 6.8 cm x 8.6 cm lobulated heterogeneous low-attenuation right paratracheal soft tissue mass. This extends from the right apex to the right suprahilar and precarinal  regions and is consistent with a neoplastic process. 2. Adjacent 6 mm and 4 mm noncalcified lung nodule within the anterior aspect of the left upper lobe. Correlation with follow-up chest CT at 12 months is recommended. 3. Moderate severity bullous changes along the anterior aspect of the mid and upper left lung. 4. Moderate to marked severity prostate gland enlargement. 5. Grade 1 anterolisthesis of the L5 vertebral body on S1. 6. Small cyst suspected within the left lobe of the liver. Aortic Atherosclerosis (ICD10-I70.0). Electronically Signed   By: Virgina Norfolk M.D.   On: 12/24/2019 19:39    ASSESSMENT:  Principal Problem:   Tracheal mass Active Problems:   Tobacco use   COPD (chronic obstructive pulmonary disease) (HCC)   Hypertension   Hyponatremia   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HCC)  Discussion: 64 year old lifelong smoker approximately half pack per day who was admitted to [facility] long hospital with chief complaints of neck and back pain. He also notes that he has decreased his appetite and trouble sleeping. Although he denied chief fever chills sweats or productive cough. He underwent CT of the chest which revealed paratracheal mass and at that time pulmonary critical care was asked to evaluate for possible fiberoptic bronchoscopy. He is not on any anticoagulants at this time. The procedure were discussed in detail with him and is amenable to having it performed most likely within 24 hours.    PLAN:  N.p.o. past midnight Permit for fiberoptic bronchoscopy with biopsy Arrangements will be made for fiberoptic bronchoscopy within the next 24 hours.   Richardson Landry Emmajean Ratledge ACNP Acute Care Nurse Practitioner [place] Please consult Amion 12/25/2019, 8:45 AM

## 2019-12-25 NOTE — Progress Notes (Signed)
PROGRESS NOTE    [name] [name]  OTL:[id number] DOB: 1956-02-09 DOA: 12/24/2019 PCP: [name], NP    Chief Complaint  Patient presents with  . Back Pain    Brief Narrative:  [name] [name] is a 64 y.o. male with medical history significant of COPD emphysema, tobacco use, hypertension, history of iron deficiency anemia with normal hemoglobin now, but still showing microcytosis, hypertension who is coming to the emergency department with complaints of right shoulder and thoracic back pain for the past 3 to 4 weeks associated with occasional dry cough that produces pleuritic chest wall pain. He also states that his appetite is decreased and he has been having trouble sleeping  Imaging: Lumbar and thoracic spine showed degenerative changes. Chest radiograph was significant for tracheal mass, CT chest/abdomen/pelvis is concerning for neoplastic process Bronchoscopy with biopsy planned likely on Monday He is also found to have the hyponatremia  Subjective:  Assessment & Plan:   Principal Problem:   Tracheal mass Active Problems:   Tobacco use   COPD (chronic obstructive pulmonary disease) (HCC)   Hypertension   Hyponatremia   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HCC)   Right paratracheal soft tissue mass -He is a smoker, he used to drink alcohol stopped several years ago, denies family history of cancer - he reports weight loss, hoarseness, decreased appetite, back pain ,right sided chest wall pain -As needed analgesic -Bronchoscopy with biopsy tomorrow -N.p.o. 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 Which could contribute to back pain Analgesics and PT eval  Moderate to marked severity prostate gland enlargement incidental finding on CT scan He currently denies urinary difficulties   Body mass index is 21.29 kg/m.     DVT prophylaxis: Place and maintain sequential compression device Start: 12/25/19 1335 SCDs Start: 12/24/19 2310   Code Status: Full Family Communication: Left message for sister Sister at [phone] Disposition:   Status is: Inpatient  Dispo: The patient is from: home              Anticipated d/c is to: home              Anticipated d/c date is: TBD              Patient currently needs biopsy and treatment for hyponatremia  Consultants:   pulmonary  Procedures:   Bronchoscopy with biopsy plan  Antimicrobials:   None     Objective: Vitals:   12/25/19 0100 12/25/19 0136 12/25/19 0141 12/25/19 0517  BP: 108/66 (!) 145/78  124/72  Pulse: 72 85  84  Resp: 17 16  14   Temp: 98.6 F (37 C) 97.9 F (36.6 C)  98.6 F (37 C)  TempSrc: Oral Oral  Oral  SpO2: 96% 95%  99%  Weight:   52.8 kg   Height:   5\' 2"  (1.575 m)     Intake/Output Summary (Last 24 hours) at 12/25/2019 1335 Last data filed at 12/25/2019 0604 Gross per 24 hour  Intake 463.76 ml  Output 200 ml  Net 263.76 ml   Filed Weights   12/25/19 0141  Weight: 52.8 kg    Examination:  General exam: Thin ,calm, NAD Respiratory system: Diminished breath sound on the left side, no wheezing, no rales, no rhonchi, respiratory effort normal. Cardiovascular system: S1 & S2 heard, RRR. No JVD, no murmur, No pedal edema. Gastrointestinal system: Abdomen is nondistended, soft and nontender. No organomegaly or masses felt. Normal bowel sounds heard. Central nervous system: Alert and oriented. No focal neurological deficits. Extremities: Symmetric 5 x 5 power. Skin: No rashes, lesions or ulcers Psychiatry: Judgement and insight appear normal.  Mood & affect appropriate.     Data Reviewed: I have personally reviewed following labs and imaging studies  CBC: Recent Labs  Lab 12/24/19 1626 12/25/19 0515  WBC 5.8 4.5  HGB 13.8 12.8*  HCT 43.1 39.3  MCV 71.4* 71.6*  PLT 434* 811    Basic Metabolic Panel: Recent Labs  Lab 12/24/19 1626 12/25/19 0515  NA 124* 126*  K 4.1 4.3  CL 90* 93*  CO2 24 22  GLUCOSE 86 80  BUN 5* <5*  CREATININE 0.59* 0.66  CALCIUM 9.3 9.0    GFR: Estimated Creatinine Clearance: 71.5 mL/min (by C-G formula based on SCr of 0.66 mg/dL).  Liver Function Tests: Recent Labs  Lab 12/25/19 0515  AST 17  ALT 10  ALKPHOS 56  BILITOT 0.6  PROT 6.6  ALBUMIN 3.4*    CBG: No results for input(s): GLUCAP in the last 168 hours.   Recent Results (from the past 240 hour(s))  SARS Coronavirus 2 by RT PCR (hospital order, performed in [facility] hospital lab) Nasopharyngeal Nasopharyngeal Swab     Status: None   Collection Time: 12/25/19 12:09 AM   Specimen: Nasopharyngeal Swab  Result Value Ref Range Status   SARS Coronavirus 2 NEGATIVE NEGATIVE Final    Comment: (NOTE) SARS-CoV-2 target nucleic acids are NOT DETECTED.  The SARS-CoV-2 RNA is generally detectable in upper and lower respiratory specimens during the acute phase of infection. The lowest concentration of SARS-CoV-2 viral copies this assay can detect is 250 copies / mL. A negative result does not preclude SARS-CoV-2 infection and should not be used as the sole basis for treatment or other patient management decisions.  A negative result may occur with improper specimen collection / handling, submission of specimen other than nasopharyngeal swab, presence of viral mutation(s) within the areas targeted by this assay, and inadequate number of viral copies (<250 copies / mL). A negative result must be combined with clinical observations, patient history, and epidemiological information.  Fact Sheet for Patients:     StrictlyIdeas.no  Fact Sheet for Healthcare Providers: BankingDealers.co.za  This test is not yet approved or  cleared by the [place] FDA and has been authorized for detection and/or diagnosis of SARS-CoV-2 by FDA under an Emergency Use Authorization (EUA).  This EUA will remain in effect (meaning this test can be used) for the duration of the COVID-19 declaration under Section 564(b)(1) of the Act, 21 U.S.C. section 360bbb-3(b)(1), unless the authorization is terminated or revoked sooner.  Performed at [facility], [place] [address], [place], [place] [address]          Radiology Studies: DG Chest 2 View  Result Date: 12/24/2019 CLINICAL DATA:  Upper back pain. EXAM: CHEST - 2 VIEW COMPARISON:  10/22/2017 FINDINGS: Heart is normal size. Large soft tissue mass in the right paratracheal region and medial left lower lobe. Left lung clear. No effusions or acute bony abnormality. IMPRESSION: Large right paratracheal soft tissue mass.  Recommend further evaluation with chest CT with IV contrast. Electronically Signed   By: Rolm Baptise M.D.   On: 12/24/2019 17:36   DG Cervical Spine Complete  Result Date: 12/24/2019 CLINICAL DATA:  Mid upper back pain.  No known injury. EXAM: CERVICAL SPINE - COMPLETE 4+ VIEW COMPARISON:  None. FINDINGS: Degenerative disc disease changes with disc space narrowing and anterior spurring. Bilateral degenerative facet disease. Severe bilateral neural foraminal narrowing at C4-5. No fracture or malalignment. Prevertebral soft tissues are normal. IMPRESSION: Degenerative disc and facet disease as above. No acute bony abnormality. Electronically Signed   By: Rolm Baptise M.D.   On: 12/24/2019 17:35   DG Thoracic Spine 2 View  Result Date: 12/24/2019 CLINICAL DATA:  Chest pain, upper back pain EXAM: THORACIC SPINE 2 VIEWS COMPARISON:  10/22/2017 FINDINGS: Soft tissue mass noted in the right  paratracheal region. This is new since prior study. No acute bony abnormality. No fracture or malalignment. No focal bone lesion. IMPRESSION: No acute bony abnormality. Large right paratracheal soft tissue mass. Recommend chest CT with IV contrast for further evaluation. Electronically Signed   By: Rolm Baptise M.D.   On: 12/24/2019 17:35   CT CHEST ABDOMEN PELVIS W CONTRAST  Result Date: 12/24/2019 CLINICAL DATA:  Right paratracheal soft tissue mass found on chest x-ray. EXAM: CT CHEST, ABDOMEN, AND PELVIS WITH CONTRAST TECHNIQUE: Multidetector CT imaging of the chest, abdomen and pelvis was performed following the standard protocol during bolus administration of intravenous contrast. CONTRAST:  119mL OMNIPAQUE IOHEXOL 300 MG/ML  SOLN COMPARISON:  May 30, 2008 FINDINGS: CT CHEST FINDINGS Cardiovascular: No significant vascular findings. Normal heart size. No pericardial effusion. Mediastinum/Nodes: A 6.7 cm x 6.8 cm x 8.6 cm lobulated heterogeneous low-attenuation right paratracheal soft tissue mass is seen. This extends from the right apex to the right suprahilar and precarinal regions. Thyroid gland, trachea, and esophagus demonstrate no significant findings. Lungs/Pleura: The lungs are hyperinflated. Moderate severity bullous changes are seen along the anterior aspect of the mid and upper left lung. Adjacent 6 mm and 4 mm noncalcified lung nodule is seen within the anterior aspect of the left upper lobe. There is no evidence of acute infiltrate, pleural effusion or pneumothorax. Musculoskeletal: No acute osseous abnormalities are identified. CT ABDOMEN PELVIS FINDINGS Hepatobiliary: A 7 mm cystic appearing area is seen within the anterior aspect of the left lobe of the liver. No gallstones, gallbladder wall thickening, or biliary dilatation. Pancreas: Unremarkable. No pancreatic ductal dilatation or surrounding inflammatory changes. Spleen: Normal in size without focal abnormality. Adrenals/Urinary Tract:  Adrenal glands are unremarkable. Kidneys are normal, without renal calculi, focal lesion, or hydronephrosis. Bladder is unremarkable. Stomach/Bowel: Stomach is within normal limits. Appendix appears normal. No evidence of bowel wall thickening, distention, or inflammatory changes. Vascular/Lymphatic: There is moderate severity calcification of the abdominal aorta and bilateral common iliac arteries, without evidence of aneurysmal dilatation. No enlarged abdominal or pelvic lymph nodes. Reproductive: There is moderate to marked severity prostate gland enlargement. Other: No abdominal wall hernia or abnormality. No abdominopelvic ascites. Musculoskeletal: Grade 1 anterolisthesis of the L5 vertebral body on S1 is seen. IMPRESSION: 1. 6.7 cm x 6.8 cm x 8.6 cm lobulated heterogeneous low-attenuation right paratracheal soft tissue mass. This extends from the right apex to the right suprahilar and precarinal regions and is consistent with a neoplastic process. 2. Adjacent 6 mm and 4 mm noncalcified lung nodule within the anterior aspect of the left upper lobe. Correlation with follow-up chest CT at 12 months  is recommended. 3. Moderate severity bullous changes along the anterior aspect of the mid and upper left lung. 4. Moderate to marked severity prostate gland enlargement. 5. Grade 1 anterolisthesis of the L5 vertebral body on S1. 6. Small cyst suspected within the left lobe of the liver. Aortic Atherosclerosis (ICD10-I70.0). Electronically Signed   By: Virgina Norfolk M.D.   On: 12/24/2019 19:39        Scheduled Meds: . [START ON 12/27/2019] aspirin EC  81 mg Oral Daily  . guaiFENesin  600 mg Oral BID  . mometasone-formoterol  2 puff Inhalation BID  . senna-docusate  1 tablet Oral QHS   Continuous Infusions: . sodium chloride 75 mL/hr at 12/25/19 0140     LOS: 0 days   Time spent: 5mins Greater than 50% of this time was spent in counseling, explanation of diagnosis, planning of further management,  and coordination of care.  I have personally reviewed and interpreted on  12/25/2019 daily labs,imagings as discussed above under date review session and assessment and plans.  I reviewed all nursing notes, pharmacy notes, consultant notes,  vitals, pertinent old records  I have discussed plan of care as described above with RN , patient and family on 12/25/2019  Voice Recognition /Dragon dictation system was used to create this note, attempts have been made to correct errors. Please contact the author with questions and/or clarifications.   Florencia Reasons, MD PhD FACP [facility]  Available via Epic secure chat 7am-7pm for nonurgent issues Please page for urgent issues To page the attending provider between 7A-7P or the covering provider during after hours 7P-7A, please log into the web site [email or web address] and access using universal [place] password for that web site. If you do not have the password, please call the hospital operator.    12/25/2019, 1:35 PM

## 2019-12-25 NOTE — ED Notes (Signed)
ED TO INPATIENT HANDOFF REPORT  Name/Age/Gender [name] 64 y.o. male  Code Status    Code Status Orders  (From admission, onward)         Start     Ordered   12/24/19 2310  Full code  Continuous        12/24/19 2312        Code Status History    This patient has a current code status but no historical code status.   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]  Level of Care/Admitting Diagnosis ED Disposition    ED Disposition Condition Comment   Admit  Hospital Area: [facility] [[id number]]  Level of Care: Med-Surg [16]  Covid Evaluation: Asymptomatic Screening Protocol (No Symptoms)  Diagnosis: Tracheal mass [914782]  Admitting Physician: Reubin Milan [[id number]]  Attending Physician: Reubin Milan [[id number]]       Medical History Past Medical History:  Diagnosis Date   COPD (chronic obstructive pulmonary disease) ([place]) 11/02/2007   Qualifier: Diagnosis of  By: Melvyn Novas MD, Christena Deem    Hypertension 12/24/2019   Iron deficiency anemia 08/23/2007   Qualifier: Diagnosis of  By: Jim Like      Allergies No Known Allergies  IV Location/Drains/Wounds Patient Lines/Drains/Airways Status    Active Line/Drains/Airways    Name Placement date Placement time Site Days   Peripheral IV 12/24/19 Right Antecubital 12/24/19  1814  Antecubital  1          Labs/Imaging Results for orders placed or performed during the hospital encounter of 12/24/19 (from the past 48 hour(s))  CBC     Status: Abnormal   Collection Time: 12/24/19  4:26 PM  Result Value Ref Range   WBC 5.8 4.0 - 10.5 K/uL   RBC 6.04 (H) 4.22 - 5.81 MIL/uL   Hemoglobin 13.8 13.0 - 17.0 g/dL   HCT 43.1 39 - 52 %   MCV 71.4 (L) 80.0 - 100.0 fL   MCH 22.8 (L) 26.0 - 34.0 pg   MCHC 32.0 30.0 - 36.0 g/dL   RDW 14.7 11.5 - 15.5 %   Platelets 434 (H) 150 - 400 K/uL   nRBC 0.0 0.0 - 0.2 %    Comment:  Performed at [facility], [place] [address] [place], [place] [address]  Basic metabolic panel     Status: Abnormal   Collection Time: 12/24/19  4:26 PM  Result Value Ref Range   Sodium 124 (L) 135 - 145 mmol/L   Potassium 4.1 3.5 - 5.1 mmol/L   Chloride 90 (L) 98 - 111 mmol/L   CO2 24 22 - 32 mmol/L   Glucose, Bld 86 70 - 99 mg/dL    Comment: Glucose reference range applies only to samples taken after fasting for at least 8 hours.   BUN 5 (L) 8 - 23 mg/dL   Creatinine, Ser 0.59 (L) 0.61 - 1.24 mg/dL   Calcium 9.3 8.9 - 10.3 mg/dL   GFR calc non Af Amer >60 >60 mL/min   GFR calc Af Amer >60 >60 mL/min   Anion gap 10 5 - 15    Comment: Performed at [facility], [place] [address], [place], [place] [address]  Troponin I (High Sensitivity)     Status: None   Collection Time: 12/24/19  5:10 PM  Result Value Ref Range   Troponin I (High Sensitivity) <2 <18 ng/L    Comment: (  NOTE) Elevated high sensitivity troponin I (hsTnI) values and significant  changes across serial measurements may suggest ACS but many other  chronic and acute conditions are known to elevate hsTnI results.  Refer to the "Links" section for chest pain algorithms and additional  guidance. Performed at [facility], [place] [address], [place], [place] [address]   Troponin I (High Sensitivity)     Status: None   Collection Time: 12/24/19  7:10 PM  Result Value Ref Range   Troponin I (High Sensitivity) <2 <18 ng/L    Comment: (NOTE) Elevated high sensitivity troponin I (hsTnI) values and significant  changes across serial measurements may suggest ACS but many other  chronic and acute conditions are known to elevate hsTnI results.  Refer to the "Links" section for chest pain algorithms and additional  guidance. Performed at [facility], [place] [address] [place], [place] [address]   Creatinine, urine, random     Status: None   Collection Time:  12/24/19  7:53 PM  Result Value Ref Range   Creatinine, Urine 149.85 mg/dL    Comment: Performed at [facility], [place] [address], [place], [place] [address]  Sodium, urine, random     Status: None   Collection Time: 12/24/19  7:53 PM  Result Value Ref Range   Sodium, Ur 76 mmol/L    Comment: Performed at [facility], [place] [address] [place], [place] [address]  Urinalysis, Routine w reflex microscopic     Status: Abnormal   Collection Time: 12/24/19  7:53 PM  Result Value Ref Range   Color, Urine YELLOW YELLOW   APPearance CLEAR CLEAR   Specific Gravity, Urine 1.043 (H) 1.005 - 1.030   pH 6.0 5.0 - 8.0   Glucose, UA NEGATIVE NEGATIVE mg/dL   Hgb urine dipstick SMALL (A) NEGATIVE   Bilirubin Urine NEGATIVE NEGATIVE   Ketones, ur 5 (A) NEGATIVE mg/dL   Protein, ur NEGATIVE NEGATIVE mg/dL   Nitrite NEGATIVE NEGATIVE   Leukocytes,Ua TRACE (A) NEGATIVE   RBC / HPF 0-5 0 - 5 RBC/hpf   WBC, UA 6-10 0 - 5 WBC/hpf   Bacteria, UA NONE SEEN NONE SEEN   Squamous Epithelial / LPF 0-5 0 - 5   Mucus PRESENT     Comment: Performed at [facility], [place] [address] [place],  [address]   DG Chest 2 View  Result Date: 12/24/2019 CLINICAL DATA:  Upper back pain. EXAM: CHEST - 2 VIEW COMPARISON:  10/22/2017 FINDINGS: Heart is normal size. Large soft tissue mass in the right paratracheal region and medial left lower lobe. Left lung clear. No effusions or acute bony abnormality. IMPRESSION: Large right paratracheal soft tissue mass. Recommend further evaluation with chest CT with IV contrast. Electronically Signed   By: Rolm Baptise M.D.   On: 12/24/2019 17:36   DG Cervical Spine Complete  Result Date: 12/24/2019 CLINICAL DATA:  Mid upper back pain.  No known injury. EXAM: CERVICAL SPINE - COMPLETE 4+ VIEW COMPARISON:  None. FINDINGS: Degenerative disc disease changes with disc space narrowing and anterior spurring. Bilateral degenerative  facet disease. Severe bilateral neural foraminal narrowing at C4-5. No fracture or malalignment. Prevertebral soft tissues are normal. IMPRESSION: Degenerative disc and facet disease as above. No acute bony abnormality. Electronically Signed   By: Rolm Baptise M.D.   On: 12/24/2019 17:35   DG Thoracic Spine 2 View  Result Date: 12/24/2019 CLINICAL DATA:  Chest pain, upper back pain  EXAM: THORACIC SPINE 2 VIEWS COMPARISON:  10/22/2017 FINDINGS: Soft tissue mass noted in the right paratracheal region. This is new since prior study. No acute bony abnormality. No fracture or malalignment. No focal bone lesion. IMPRESSION: No acute bony abnormality. Large right paratracheal soft tissue mass. Recommend chest CT with IV contrast for further evaluation. Electronically Signed   By: Rolm Baptise M.D.   On: 12/24/2019 17:35   CT CHEST ABDOMEN PELVIS W CONTRAST  Result Date: 12/24/2019 CLINICAL DATA:  Right paratracheal soft tissue mass found on chest x-ray. EXAM: CT CHEST, ABDOMEN, AND PELVIS WITH CONTRAST TECHNIQUE: Multidetector CT imaging of the chest, abdomen and pelvis was performed following the standard protocol during bolus administration of intravenous contrast. CONTRAST:  132mL OMNIPAQUE IOHEXOL 300 MG/ML  SOLN COMPARISON:  May 30, 2008 FINDINGS: CT CHEST FINDINGS Cardiovascular: No significant vascular findings. Normal heart size. No pericardial effusion. Mediastinum/Nodes: A 6.7 cm x 6.8 cm x 8.6 cm lobulated heterogeneous low-attenuation right paratracheal soft tissue mass is seen. This extends from the right apex to the right suprahilar and precarinal regions. Thyroid gland, trachea, and esophagus demonstrate no significant findings. Lungs/Pleura: The lungs are hyperinflated. Moderate severity bullous changes are seen along the anterior aspect of the mid and upper left lung. Adjacent 6 mm and 4 mm noncalcified lung nodule is seen within the anterior aspect of the left upper lobe. There is no evidence of  acute infiltrate, pleural effusion or pneumothorax. Musculoskeletal: No acute osseous abnormalities are identified. CT ABDOMEN PELVIS FINDINGS Hepatobiliary: A 7 mm cystic appearing area is seen within the anterior aspect of the left lobe of the liver. No gallstones, gallbladder wall thickening, or biliary dilatation. Pancreas: Unremarkable. No pancreatic ductal dilatation or surrounding inflammatory changes. Spleen: Normal in size without focal abnormality. Adrenals/Urinary Tract: Adrenal glands are unremarkable. Kidneys are normal, without renal calculi, focal lesion, or hydronephrosis. Bladder is unremarkable. Stomach/Bowel: Stomach is within normal limits. Appendix appears normal. No evidence of bowel wall thickening, distention, or inflammatory changes. Vascular/Lymphatic: There is moderate severity calcification of the abdominal aorta and bilateral common iliac arteries, without evidence of aneurysmal dilatation. No enlarged abdominal or pelvic lymph nodes. Reproductive: There is moderate to marked severity prostate gland enlargement. Other: No abdominal wall hernia or abnormality. No abdominopelvic ascites. Musculoskeletal: Grade 1 anterolisthesis of the L5 vertebral body on S1 is seen. IMPRESSION: 1. 6.7 cm x 6.8 cm x 8.6 cm lobulated heterogeneous low-attenuation right paratracheal soft tissue mass. This extends from the right apex to the right suprahilar and precarinal regions and is consistent with a neoplastic process. 2. Adjacent 6 mm and 4 mm noncalcified lung nodule within the anterior aspect of the left upper lobe. Correlation with follow-up chest CT at 12 months is recommended. 3. Moderate severity bullous changes along the anterior aspect of the mid and upper left lung. 4. Moderate to marked severity prostate gland enlargement. 5. Grade 1 anterolisthesis of the L5 vertebral body on S1. 6. Small cyst suspected within the left lobe of the liver. Aortic Atherosclerosis (ICD10-I70.0). Electronically  Signed   By: Virgina Norfolk M.D.   On: 12/24/2019 19:39    Pending Labs Unresulted Labs (From admission, onward) Comment          Start     Ordered   12/25/19 0500  HIV Antibody (routine testing w rflx)  (HIV Antibody (Routine testing w reflex) panel)  Tomorrow morning,   R        12/24/19 2312   12/25/19 0500  CBC  Tomorrow morning,   R        12/24/19 2312   12/25/19 0500  Protime-INR  Tomorrow morning,   R        12/24/19 2312   12/25/19 0500  Comprehensive metabolic panel  Tomorrow morning,   R        12/24/19 2312   12/25/19 0009  SARS Coronavirus 2 by RT PCR (hospital order, performed in [place] hospital lab) Nasopharyngeal Nasopharyngeal Swab  (Tier 2 (TAT 2 hrs))  Once,   STAT       Question Answer Comment  Is this test for diagnosis or screening Screening   Symptomatic for COVID-19 as defined by CDC No   Hospitalized for COVID-19 No   Admitted to ICU for COVID-19 No   Previously tested for COVID-19 Yes   Resident in a congregate (group) care setting No   Employed in healthcare setting No   Has patient completed COVID vaccination(s) (2 doses of Pfizer/Moderna 1 dose of [employer]) Yes      12/25/19 0008          Vitals/Pain Today's Vitals   12/24/19 2000 12/24/19 2347 12/24/19 2348 12/25/19 0100  BP: 140/68 122/73  108/66  Pulse: 85 68  72  Resp: (!) 21 19  17   Temp:    98.6 F (37 C)  TempSrc:    Oral  SpO2: 97% 98%  96%  PainSc:   2      Isolation Precautions No active isolations  Medications Medications  sodium chloride (PF) 0.9 % injection (has no administration in time range)  0.9 %  sodium chloride infusion ( Intravenous New Bag/Given 12/24/19 2347)  acetaminophen (TYLENOL) tablet 650 mg (has no administration in time range)    Or  acetaminophen (TYLENOL) suppository 650 mg (has no administration in time range)  ondansetron (ZOFRAN) tablet 4 mg (has no administration in time range)    Or  ondansetron (ZOFRAN) injection 4 mg (has no  administration in time range)  HYDROmorphone (DILAUDID) injection 1 mg (has no administration in time range)  iohexol (OMNIPAQUE) 300 MG/ML solution 100 mL (100 mLs Intravenous Contrast Given 12/24/19 1858)  oxyCODONE-acetaminophen (PERCOCET/ROXICET) 5-325 MG per tablet 1 tablet (1 tablet Oral Given 12/24/19 1950)    Mobility walks

## 2019-12-26 DIAGNOSIS — I10 Essential (primary) hypertension: Secondary | ICD-10-CM

## 2019-12-26 DIAGNOSIS — E440 Moderate protein-calorie malnutrition: Secondary | ICD-10-CM | POA: Insufficient documentation

## 2019-12-26 LAB — BASIC METABOLIC PANEL
Anion gap: 8 (ref 5–15)
BUN: 6 mg/dL — ABNORMAL LOW (ref 8–23)
CO2: 23 mmol/L (ref 22–32)
Calcium: 8.5 mg/dL — ABNORMAL LOW (ref 8.9–10.3)
Chloride: 97 mmol/L — ABNORMAL LOW (ref 98–111)
Creatinine, Ser: 0.62 mg/dL (ref 0.61–1.24)
GFR calc Af Amer: >60 mL/min (ref >60)
GFR calc non Af Amer: >60 mL/min (ref >60)
Glucose, Bld: 99 mg/dL (ref 70–99)
Potassium: 3.8 mmol/L (ref 3.5–5.1)
Sodium: 128 mmol/L — ABNORMAL LOW (ref 135–145)

## 2019-12-26 LAB — URIC ACID: Uric Acid, Serum: 4.5 mg/dL (ref 3.7–8.6)

## 2019-12-26 MED ORDER — ENSURE ENLIVE PO LIQD
237.0000 mL | Freq: Two times a day (BID) | ORAL | Status: DC
  Administered 2019-12-26 – 2019-12-28 (×4): 237 mL via ORAL

## 2019-12-26 MED ORDER — ADULT MULTIVITAMIN W/MINERALS CH
1.0000 | ORAL_TABLET | Freq: Every day | ORAL | Status: DC
  Administered 2019-12-26 – 2019-12-28 (×2): 1 via ORAL
  Filled 2019-12-26 (×2): qty 1

## 2019-12-26 NOTE — Progress Notes (Signed)
NAME:  [name] [name], MRN:  [id number], DOB:  31-Jan-1956, LOS: 1 ADMISSION DATE:  12/24/2019, CONSULTATION DATE: 12/25/2019 REFERRING MD: Dr. Earnest Conroy, CHIEF COMPLAINT: back, neck and chest pain  Brief History   64 year old cachectic male who is a lifelong smoker of less than 10 cigarettes a day and is retired. He notes ongoing weight loss and severe back and neck pain over the last several weeks. He denies hemoptysis productive cough fevers chills sweats or any other malaise chest and back pain. He presented to [facility] long hospital underwent CT of the chest which revealed extensive paratracheal mass. Pulmonary critical care was asked to evaluate for possible fiberoptic bronchoscopy. He is amenable to this procedure. It was explained to him in full and he expressed verbal understanding.  Past Medical History   Past Medical History:  Diagnosis Date  . 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[place]) 12/25/2019  . COPD (chronic obstructive pulmonary disease) ([place]) 11/02/2007   Qualifier: Diagnosis of  By: Melvyn Novas MD, Christena Deem   . Hypertension 12/24/2019  . Iron deficiency anemia 08/23/2007   Qualifier: Diagnosis of  By: Diamantina Monks Events   No significant events  Consults:    Procedures:    Significant Diagnostic Tests:  CT scan of the chest reviewed showing large mediastinal mass  Micro Data:   Antimicrobials:  None  Interim history/subjective:  No overnight events Feels relatively well  Objective   Blood pressure 134/72, pulse 81, temperature 98.1 F (36.7 C), resp. rate 16, height 5\' 2"  (1.575 m), weight 52.8 kg, SpO2 97 %.        Intake/Output Summary (Last 24 hours) at 12/26/2019 1025 Last data filed at 12/26/2019 0959 Gross per 24 hour  Intake 2406.34 ml  Output 1650 ml  Net 756.34 ml   Filed Weights   12/25/19 0141  Weight: 52.8 kg    Examination: General: Cachectic HENT: Dry oral mucosa Lungs: Decreased air movement bilaterally Cardiovascular: S1-S2  appreciated Abdomen: Bowel sounds appreciated Extremities: No clubbing, no edema Neuro: Alert and oriented x3 GU:   Resolved Hospital Problem list     Assessment & Plan:  Large mediastinal mass Active smoker Chronic obstructive pulmonary disease Hypertension Hyponatremia Emphysema  Patient has a large paratracheal mass  EBUS bronchoscopy scheduled for 12/27/2019 at 1230  Best practice:  Diet: Regular diet, to be n.p.o. from midnight Pain/Anxiety/Delirium protocol (if indicated): Tylenol as needed, Dilaudid VAP protocol (if indicated): Not indicated Code Status: Full code Family Communication: Patient's only contact is his pastor Disposition: Medical floor  Labs   CBC: Recent Labs  Lab 12/24/19 1626 12/25/19 0515  WBC 5.8 4.5  HGB 13.8 12.8*  HCT 43.1 39.3  MCV 71.4* 71.6*  PLT 434* 834    Basic Metabolic Panel: Recent Labs  Lab 12/24/19 1626 12/25/19 0515 12/26/19 0419  NA 124* 126* 128*  K 4.1 4.3 3.8  CL 90* 93* 97*  CO2 24 22 23   GLUCOSE 86 80 99  BUN 5* <5* 6*  CREATININE 0.59* 0.66 0.62  CALCIUM 9.3 9.0 8.5*   GFR: Estimated Creatinine Clearance: 71.5 mL/min (by C-G formula based on SCr of 0.62 mg/dL). Recent Labs  Lab 12/24/19 1626 12/25/19 0515  WBC 5.8 4.5    Liver Function Tests: Recent Labs  Lab 12/25/19 0515  AST 17  ALT 10  ALKPHOS 56  BILITOT 0.6  PROT 6.6  ALBUMIN 3.4*   No results for input(s): LIPASE, AMYLASE in the last 168 hours. No results for input(s):  AMMONIA in the last 168 hours.  ABG No results found for: PHART, PCO2ART, PO2ART, HCO3, TCO2, ACIDBASEDEF, O2SAT   Coagulation Profile: Recent Labs  Lab 12/25/19 0515  INR 1.1    Cardiac Enzymes: No results for input(s): CKTOTAL, CKMB, CKMBINDEX, TROPONINI in the last 168 hours.  HbA1C: Hemoglobin A1C  Date/Time Value Ref Range Status  05/31/2018 04:54 PM 5.6 4.0 - 5.6 % Final   Hgb A1c MFr Bld  Date/Time Value Ref Range Status  05/29/2008 09:43 PM   4.6 - 6.1 % Final   5.7 (NOTE)   The ADA recommends the following therapeutic goal for glycemic   control related to Hgb A1C measurement:   Goal of Therapy:   < 7.0% Hgb A1C   Reference: American Diabetes Association: Clinical Practice   Recommendations 2008, Diabetes Care,  2008, 31:(Suppl 1).    CBG: No results for input(s): GLUCAP in the last 168 hours.  Review of Systems:   Cough  Past Medical History  He,  has a past medical history of 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[place]) (12/25/2019), COPD (chronic obstructive pulmonary disease) ([place]) (11/02/2007), Hypertension (12/24/2019), and Iron deficiency anemia (08/23/2007).   Surgical History   History reviewed. No pertinent surgical history.   Social History   reports that he has been smoking cigarettes. He has been smoking about 0.25 packs per day. He has never used smokeless tobacco. He reports previous alcohol use. He reports that he does not use drugs.   Family History   His family history includes Hypertension in an other family member.   Allergies No Known Allergies   Home Medications  Prior to Admission medications   Medication Sig Start Date End Date Taking? Authorizing Provider  acetaminophen (TYLENOL) 500 MG tablet Take 500 mg by mouth every 6 (six) hours as needed for mild pain or moderate pain.   Yes [provider]  albuterol (VENTOLIN HFA) 108 (90 Base) MCG/ACT inhaler Inhale 2 puffs into the lungs every 4 (four) hours as needed for wheezing or shortness of breath. 08/03/19 12/24/19 Yes [name], NP  aspirin 81 MG EC tablet Take 1 tablet (81 mg total) by mouth daily. 04/27/18  Yes Clent Demark, PA-C  budesonide-formoterol [facility]) 80-4.5 MCG/ACT inhaler Inhale 2 puffs into the lungs 2 (two) times daily. Patient taking differently: Inhale 2 puffs into the lungs 2 (two) times daily as needed (wheezing, SOB).  08/03/19  Yes [name], NP  losartan (COZAAR) 25 MG tablet Take 1 tablet (25 mg total) by mouth  daily. 08/03/19  Yes [name], NP  Multiple Vitamin (MULTIVITAMIN) tablet Take 1 tablet by mouth daily.   Yes [provider]    Sherrilyn Rist, MD [place] [facility] Pager: [phone]

## 2019-12-26 NOTE — Progress Notes (Signed)
Initial Nutrition Assessment  DOCUMENTATION CODES:   Non-severe (moderate) malnutrition in context of chronic illness  INTERVENTION:   -Ensure Enlive po BID, each supplement provides 350 kcal and 20 grams of protein -Multivitamin with minerals daily  NUTRITION DIAGNOSIS:   Moderate Malnutrition related to chronic illness (COPD, tracheal mass) as evidenced by percent weight loss, moderate fat depletion, moderate muscle depletion.  GOAL:   Patient will meet greater than or equal to 90% of their needs   MONITOR:   PO intake, Supplement acceptance, Labs, Weight trends, I & O's  REASON FOR ASSESSMENT:   Malnutrition Screening Tool    ASSESSMENT:   64 y.o. male with h/o HTN,COPD/ emphysema, tobacco use,  Presented with complaints of right shoulder and thoracic back pain for 3 to 4 weeks associated with occasional dry cough, pleuritic chest wall pain, loss of appetite and insomnia.  Patient with new paratracheal mass. Pt has not been eating well per report. Did not eat anything this morning d/t NPO status. Diet was then advanced to regular for today. Pt scheduled for bronchoscopy tomorrow.   Pt states he is willing to try Ensure supplements for additional kcals and protein. Denies issues swallowing.  Per weight records, pt has lost 18 lbs since 3/17 (13% wt loss x 5 months, significant for time frame).   Medications: Senokot Labs reviewed:  Low Na  NUTRITION - FOCUSED PHYSICAL EXAM:    Most Recent Value  Orbital Region Mild depletion  Upper Arm Region Severe depletion  Thoracic and Lumbar Region Unable to assess  Buccal Region Unable to assess  [masked]  Temple Region Moderate depletion  Clavicle Bone Region Moderate depletion  Clavicle and Acromion Bone Region Moderate depletion  Scapular Bone Region Moderate depletion  Dorsal Hand Mild depletion  Patellar Region Unable to assess  Anterior Thigh Region Unable to assess  Posterior Calf Region Unable to assess    Edema (RD Assessment) None  Hair Reviewed  Eyes Reviewed  Mouth Unable to assess  [masked]  Skin Reviewed       Diet Order:   Diet Order            Diet NPO time specified  Diet effective midnight           Diet regular Room service appropriate? Yes; Fluid consistency: Thin  Diet effective now                 EDUCATION NEEDS:   No education needs have been identified at this time  Skin:  Skin Assessment: Reviewed RN Assessment  Last BM:  8/8  Height:   Ht Readings from Last 1 Encounters:  12/25/19 5\' 2"  (1.575 m)    Weight:   Wt Readings from Last 1 Encounters:  12/25/19 52.8 kg    BMI:  Body mass index is 21.29 kg/m.  Estimated Nutritional Needs:   Kcal:  1600-1800  Protein:  75-90g  Fluid:  1.8L/day  Clayton Bibles, MS, RD, LDN Inpatient Clinical Dietitian Contact information available via Amion

## 2019-12-26 NOTE — H&P (View-Only) (Signed)
NAME:  [name] [name], MRN:  [id number], DOB:  02-08-1956, LOS: 1 ADMISSION DATE:  12/24/2019, CONSULTATION DATE: 12/25/2019 REFERRING MD: Dr. Earnest Conroy, CHIEF COMPLAINT: back, neck and chest pain  Brief History   64 year old cachectic male who is a lifelong smoker of less than 10 cigarettes a day and is retired. He notes ongoing weight loss and severe back and neck pain over the last several weeks. He denies hemoptysis productive cough fevers chills sweats or any other malaise chest and back pain. He presented to [facility] long hospital underwent CT of the chest which revealed extensive paratracheal mass. Pulmonary critical care was asked to evaluate for possible fiberoptic bronchoscopy. He is amenable to this procedure. It was explained to him in full and he expressed verbal understanding.  Past Medical History   Past Medical History:  Diagnosis Date  . 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[place]) 12/25/2019  . COPD (chronic obstructive pulmonary disease) ([place]) 11/02/2007   Qualifier: Diagnosis of  By: Melvyn Novas MD, Christena Deem   . Hypertension 12/24/2019  . Iron deficiency anemia 08/23/2007   Qualifier: Diagnosis of  By: Diamantina Monks Events   No significant events  Consults:    Procedures:    Significant Diagnostic Tests:  CT scan of the chest reviewed showing large mediastinal mass  Micro Data:   Antimicrobials:  None  Interim history/subjective:  No overnight events Feels relatively well  Objective   Blood pressure 134/72, pulse 81, temperature 98.1 F (36.7 C), resp. rate 16, height 5\' 2"  (1.575 m), weight 52.8 kg, SpO2 97 %.        Intake/Output Summary (Last 24 hours) at 12/26/2019 1025 Last data filed at 12/26/2019 0959 Gross per 24 hour  Intake 2406.34 ml  Output 1650 ml  Net 756.34 ml   Filed Weights   12/25/19 0141  Weight: 52.8 kg    Examination: General: Cachectic HENT: Dry oral mucosa Lungs: Decreased air movement bilaterally Cardiovascular: S1-S2  appreciated Abdomen: Bowel sounds appreciated Extremities: No clubbing, no edema Neuro: Alert and oriented x3 GU:   Resolved Hospital Problem list     Assessment & Plan:  Large mediastinal mass Active smoker Chronic obstructive pulmonary disease Hypertension Hyponatremia Emphysema  Patient has a large paratracheal mass  EBUS bronchoscopy scheduled for 12/27/2019 at 1230  Best practice:  Diet: Regular diet, to be n.p.o. from midnight Pain/Anxiety/Delirium protocol (if indicated): Tylenol as needed, Dilaudid VAP protocol (if indicated): Not indicated Code Status: Full code Family Communication: Patient's only contact is his pastor Disposition: Medical floor  Labs   CBC: Recent Labs  Lab 12/24/19 1626 12/25/19 0515  WBC 5.8 4.5  HGB 13.8 12.8*  HCT 43.1 39.3  MCV 71.4* 71.6*  PLT 434* 818    Basic Metabolic Panel: Recent Labs  Lab 12/24/19 1626 12/25/19 0515 12/26/19 0419  NA 124* 126* 128*  K 4.1 4.3 3.8  CL 90* 93* 97*  CO2 24 22 23   GLUCOSE 86 80 99  BUN 5* <5* 6*  CREATININE 0.59* 0.66 0.62  CALCIUM 9.3 9.0 8.5*   GFR: Estimated Creatinine Clearance: 71.5 mL/min (by C-G formula based on SCr of 0.62 mg/dL). Recent Labs  Lab 12/24/19 1626 12/25/19 0515  WBC 5.8 4.5    Liver Function Tests: Recent Labs  Lab 12/25/19 0515  AST 17  ALT 10  ALKPHOS 56  BILITOT 0.6  PROT 6.6  ALBUMIN 3.4*   No results for input(s): LIPASE, AMYLASE in the last 168 hours. No results for input(s):  AMMONIA in the last 168 hours.  ABG No results found for: PHART, PCO2ART, PO2ART, HCO3, TCO2, ACIDBASEDEF, O2SAT   Coagulation Profile: Recent Labs  Lab 12/25/19 0515  INR 1.1    Cardiac Enzymes: No results for input(s): CKTOTAL, CKMB, CKMBINDEX, TROPONINI in the last 168 hours.  HbA1C: Hemoglobin A1C  Date/Time Value Ref Range Status  05/31/2018 04:54 PM 5.6 4.0 - 5.6 % Final   Hgb A1c MFr Bld  Date/Time Value Ref Range Status  05/29/2008 09:43 PM   4.6 - 6.1 % Final   5.7 (NOTE)   The ADA recommends the following therapeutic goal for glycemic   control related to Hgb A1C measurement:   Goal of Therapy:   < 7.0% Hgb A1C   Reference: American Diabetes Association: Clinical Practice   Recommendations 2008, Diabetes Care,  2008, 31:(Suppl 1).    CBG: No results for input(s): GLUCAP in the last 168 hours.  Review of Systems:   Cough  Past Medical History  He,  has a past medical history of 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[place]) (12/25/2019), COPD (chronic obstructive pulmonary disease) ([place]) (11/02/2007), Hypertension (12/24/2019), and Iron deficiency anemia (08/23/2007).   Surgical History   History reviewed. No pertinent surgical history.   Social History   reports that he has been smoking cigarettes. He has been smoking about 0.25 packs per day. He has never used smokeless tobacco. He reports previous alcohol use. He reports that he does not use drugs.   Family History   His family history includes Hypertension in an other family member.   Allergies No Known Allergies   Home Medications  Prior to Admission medications   Medication Sig Start Date End Date Taking? Authorizing Provider  acetaminophen (TYLENOL) 500 MG tablet Take 500 mg by mouth every 6 (six) hours as needed for mild pain or moderate pain.   Yes [provider]  albuterol (VENTOLIN HFA) 108 (90 Base) MCG/ACT inhaler Inhale 2 puffs into the lungs every 4 (four) hours as needed for wheezing or shortness of breath. 08/03/19 12/24/19 Yes [name], NP  aspirin 81 MG EC tablet Take 1 tablet (81 mg total) by mouth daily. 04/27/18  Yes Clent Demark, PA-C  budesonide-formoterol [facility]) 80-4.5 MCG/ACT inhaler Inhale 2 puffs into the lungs 2 (two) times daily. Patient taking differently: Inhale 2 puffs into the lungs 2 (two) times daily as needed (wheezing, SOB).  08/03/19  Yes [name], NP  losartan (COZAAR) 25 MG tablet Take 1 tablet (25 mg total) by mouth  daily. 08/03/19  Yes [name], NP  Multiple Vitamin (MULTIVITAMIN) tablet Take 1 tablet by mouth daily.   Yes [provider]    Sherrilyn Rist, MD [place] [facility] Pager: [phone]

## 2019-12-27 ENCOUNTER — Inpatient Hospital Stay (HOSPITAL_COMMUNITY): Payer: Self-pay | Admitting: Anesthesiology

## 2019-12-27 ENCOUNTER — Encounter (HOSPITAL_COMMUNITY): Payer: Self-pay | Admitting: Internal Medicine

## 2019-12-27 ENCOUNTER — Encounter (HOSPITAL_COMMUNITY)
Admission: EM | Disposition: A | Discharge status: Home / Self Care | Payer: Self-pay | Source: Home / Self Care | Attending: Internal Medicine

## 2019-12-27 HISTORY — PX: FINE NEEDLE ASPIRATION: SHX6590

## 2019-12-27 HISTORY — PX: ENDOBRONCHIAL ULTRASOUND: SHX5096

## 2019-12-27 HISTORY — PX: VIDEO BRONCHOSCOPY: SHX5072

## 2019-12-27 LAB — BASIC METABOLIC PANEL
Anion gap: 10 (ref 5–15)
BUN: 7 mg/dL — ABNORMAL LOW (ref 8–23)
CO2: 22 mmol/L (ref 22–32)
Calcium: 9 mg/dL (ref 8.9–10.3)
Chloride: 99 mmol/L (ref 98–111)
Creatinine, Ser: 0.55 mg/dL — ABNORMAL LOW (ref 0.61–1.24)
GFR calc Af Amer: >60 mL/min (ref >60)
GFR calc non Af Amer: >60 mL/min (ref >60)
Glucose, Bld: 102 mg/dL — ABNORMAL HIGH (ref 70–99)
Potassium: 3.9 mmol/L (ref 3.5–5.1)
Sodium: 131 mmol/L — ABNORMAL LOW (ref 135–145)

## 2019-12-27 SURGERY — ENDOBRONCHIAL ULTRASOUND (EBUS)
Anesthesia: General

## 2019-12-27 MED ORDER — LIDOCAINE 2% (20 MG/ML) 5 ML SYRINGE
INTRAMUSCULAR | Status: DC | PRN
  Administered 2019-12-27: 60 mg via INTRAVENOUS

## 2019-12-27 MED ORDER — ONDANSETRON HCL 4 MG/2ML IJ SOLN
INTRAMUSCULAR | Status: DC | PRN
  Administered 2019-12-27: 4 mg via INTRAVENOUS

## 2019-12-27 MED ORDER — PROPOFOL 10 MG/ML IV BOLUS
INTRAVENOUS | Status: DC | PRN
  Administered 2019-12-27: 160 mg via INTRAVENOUS

## 2019-12-27 MED ORDER — SUGAMMADEX SODIUM 200 MG/2ML IV SOLN
INTRAVENOUS | Status: DC | PRN
  Administered 2019-12-27: 200 mg via INTRAVENOUS

## 2019-12-27 MED ORDER — PROPOFOL 10 MG/ML IV BOLUS
INTRAVENOUS | Status: AC
  Filled 2019-12-27: qty 20

## 2019-12-27 MED ORDER — FENTANYL CITRATE (PF) 100 MCG/2ML IJ SOLN
INTRAMUSCULAR | Status: DC | PRN
  Administered 2019-12-27 (×2): 50 ug via INTRAVENOUS

## 2019-12-27 MED ORDER — FENTANYL CITRATE (PF) 100 MCG/2ML IJ SOLN
INTRAMUSCULAR | Status: AC
  Filled 2019-12-27: qty 2

## 2019-12-27 MED ORDER — MIDAZOLAM HCL 5 MG/5ML IJ SOLN
INTRAMUSCULAR | Status: DC | PRN
  Administered 2019-12-27: 1 mg via INTRAVENOUS

## 2019-12-27 MED ORDER — PHENYLEPHRINE 40 MCG/ML (10ML) SYRINGE FOR IV PUSH (FOR BLOOD PRESSURE SUPPORT)
PREFILLED_SYRINGE | INTRAVENOUS | Status: DC | PRN
  Administered 2019-12-27: 80 ug via INTRAVENOUS

## 2019-12-27 MED ORDER — DEXAMETHASONE SODIUM PHOSPHATE 10 MG/ML IJ SOLN
INTRAMUSCULAR | Status: DC | PRN
  Administered 2019-12-27: 8 mg via INTRAVENOUS

## 2019-12-27 MED ORDER — LACTATED RINGERS IV SOLN: INTRAVENOUS | Status: DC | PRN

## 2019-12-27 MED ORDER — SUCCINYLCHOLINE CHLORIDE 200 MG/10ML IV SOSY
PREFILLED_SYRINGE | INTRAVENOUS | Status: DC | PRN
  Administered 2019-12-27: 100 mg via INTRAVENOUS

## 2019-12-27 MED ORDER — ROCURONIUM BROMIDE 10 MG/ML (PF) SYRINGE
PREFILLED_SYRINGE | INTRAVENOUS | Status: DC | PRN
  Administered 2019-12-27: 30 mg via INTRAVENOUS

## 2019-12-27 MED ORDER — MIDAZOLAM HCL 2 MG/2ML IJ SOLN
INTRAMUSCULAR | Status: AC
  Filled 2019-12-27: qty 2

## 2019-12-27 MED ORDER — PROPOFOL 1000 MG/100ML IV EMUL
INTRAVENOUS | Status: AC
  Filled 2019-12-27: qty 100

## 2019-12-27 NOTE — Op Note (Signed)
Flexible and EBUS Bronchoscopy Procedure Note  [name] [name]  [id number]  03/25/1956  Date:12/27/19  Time:10:46 AM   Provider Performing:[name]   Procedure: Flexible bronchoscopy and EBUS Bronchoscopy  Indication(s) Mediastinal mass  Consent Risks of the procedure as well as the alternatives and risks of each were explained to the patient and/or caregiver.  Consent for the procedure was obtained.  Anesthesia General Anesthesia   Time Out Verified patient identification, verified procedure, site/side was marked, verified correct patient position, special equipment/implants available, medications/allergies/relevant history reviewed, required imaging and test results available.   Sterile Technique Usual hand hygiene, masks, gowns, and gloves were used   Procedure Description Diagnostic bronchoscope advanced through endotracheal tube and into airway.  Airways were examined down to subsegmental level with findings noted below.  Following diagnostic evaluation, the diagnostic bronchoscope was then removed and the EBUS bronchoscope was advanced into airway with stations subcarinal node biopsied and sent for slide, cell block, and/or culture.  Paratracheal mass was biopsied.  The EBUS bronchoscope was removed after assuring no active bleeding from biopsy site.  Findings: Narrowing of the airway noted at site of mass lesion, no endotracheal mass lesions noted   Complications/Tolerance None; patient tolerated the procedure well. Chest X-ray is not needed post procedure.   EBL Minimal   Specimen(s)  biopsy of paratracheal mass Subcarinal node biopsy

## 2019-12-27 NOTE — Anesthesia Preprocedure Evaluation (Addendum)
Anesthesia Evaluation  Patient identified by MRN, date of birth, ID band Patient awake    Reviewed: Allergy & Precautions, NPO status , Patient's Chart, lab work & pertinent test results, reviewed documented beta blocker date and time   Airway Mallampati: II  TM Distance: >3 FB Neck ROM: Full    Dental  (+) Poor Dentition, Chipped,    Pulmonary COPD,  COPD inhaler, Current Smoker,  Right paratracheal mediastinal mass   breath sounds clear to auscultation + decreased breath sounds      Cardiovascular hypertension, Pt. on medications Normal cardiovascular exam Rhythm:Regular Rate:Normal     Neuro/Psych negative neurological ROS  negative psych ROS   GI/Hepatic negative GI ROS, Neg liver ROS,   Endo/Other  negative endocrine ROS  Renal/GU negative Renal ROS  negative genitourinary   Musculoskeletal negative musculoskeletal ROS (+)   Abdominal   Peds  Hematology  (+) anemia ,   Anesthesia Other Findings   Reproductive/Obstetrics                            Anesthesia Physical Anesthesia Plan  ASA: III  Anesthesia Plan: General   Post-op Pain Management:    Induction: Intravenous  PONV Risk Score and Plan: 2 and Ondansetron, Treatment may vary due to age or medical condition and Midazolam  Airway Management Planned: Oral ETT  Additional Equipment:   Intra-op Plan:   Post-operative Plan: Extubation in OR  Informed Consent: I have reviewed the patients History and Physical, chart, labs and discussed the procedure including the risks, benefits and alternatives for the proposed anesthesia with the patient or authorized representative who has indicated his/her understanding and acceptance.     Dental advisory given  Plan Discussed with: CRNA and Anesthesiologist  Anesthesia Plan Comments:         Anesthesia Quick Evaluation

## 2019-12-27 NOTE — Consult Note (Signed)
[place] Telephone:(336) [phone]   Fax:([phone]) [phone]  CONSULT NOTE  REFERRING PHYSICIAN: Dr. Sherrilyn Rist  REASON FOR CONSULTATION:  64 years old African-American male with highly suspicious small cell lung cancer.  HPI [name] [name] is a 64 y.o. male with past medical history significant for COPD, hypertension, iron deficiency as well as long history of smoking since age 71.  The patient presented to the emergency department complaining of right shoulder and thoracic back pain that has been going on for several weeks with cough and shortness of breath.  He also lost some weight secondary to lack of appetite.  During his evaluation he had x-ray of the thoracic spine on 12/24/2019 and it showed large right paratracheal soft tissue mass.  This was followed by CT scan of the chest, abdomen pelvis on 12/24/2019 and it showed a 6.7 x 6.8 x 8.6 cm lobulated heterogeneous low-attenuation right paratracheal soft tissue mass.  This extends from the right apex to the right suprahilar and precarinal regions.  There was also adjacent 0.6 and 0.4 centimeters noncalcified lung nodule seen within the anterior aspect of the left upper lobe.  CT of the abdomen pelvis showed moderate to marked severity prostate gland enlargement but no evidence of metastatic disease in the abdomen or pelvis.  The patient was seen by Dr. Ander Slade and on 12/27/2019 he underwent bronchoscopy with biopsy of the right lung mass.  The final pathology is still pending but suspicious for small cell carcinoma.  The patient also has persistent hyponatremia consistent with the suspicious diagnosis of his small cell lung cancer. When seen today he is feeling fine except for the shortness of breath with exertion and tightness in the central part of the chest.  He denied having any hemoptysis but has dry cough.  He denied having any nausea, vomiting, diarrhea or constipation.  He denied having any headache or visual  changes. Family history significant for mother and father died from old age. The patient is single and has no children.  He is currently retired.  He used to work in [employer].  He has a history for smoking less than 1 pack/day for over 55 years and unfortunately continues to smoke.  He drinks alcohol occasionally and no history of drug abuse.  HPI  Past Medical History:  Diagnosis Date  . 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[place]) 12/25/2019  . COPD (chronic obstructive pulmonary disease) ([place]) 11/02/2007   Qualifier: Diagnosis of  By: Melvyn Novas MD, Christena Deem   . Hypertension 12/24/2019  . Iron deficiency anemia 08/23/2007   Qualifier: Diagnosis of  By: Jim Like      History reviewed. No pertinent surgical history.  Family History  Problem Relation Age of Onset  . Hypertension Other     Social History Social History   Tobacco Use  . Smoking status: Current Every Day Smoker    Packs/day: 0.50    Types: Cigarettes  . Smokeless tobacco: Never Used  Vaping Use  . Vaping Use: Never used  Substance Use Topics  . Alcohol use: Not Currently  . Drug use: Never    No Known Allergies  Current Facility-Administered Medications  Medication Dose Route Frequency Provider Last Rate Last Admin  . 0.9 %  sodium chloride infusion   Intravenous Continuous Reubin Milan, MD 75 mL/hr at 12/26/19 1330 New Bag at 12/26/19 1330  . acetaminophen (TYLENOL) tablet 650 mg  650 mg Oral Q6H PRN Reubin Milan, MD   650 mg at 12/26/19  2242   Or  . acetaminophen (TYLENOL) suppository 650 mg  650 mg Rectal Q6H PRN Reubin Milan, MD      . aspirin EC tablet 81 mg  81 mg Oral Daily Florencia Reasons, MD      . feeding supplement (ENSURE ENLIVE) (ENSURE ENLIVE) liquid 237 mL  237 mL Oral BID BM Guilford Shi, MD   237 mL at 12/27/19 1408  . guaiFENesin (MUCINEX) 12 hr tablet 600 mg  600 mg Oral BID Florencia Reasons, MD   600 mg at 12/26/19 2242  . HYDROcodone-homatropine (HYCODAN) 5-1.5 MG/5ML syrup 5 mL  5 mL Oral  Q6H PRN Reubin Milan, MD      . HYDROmorphone (DILAUDID) injection 1 mg  1 mg Intravenous Q4H PRN Reubin Milan, MD   1 mg at 12/27/19 9381  . ipratropium-albuterol (DUONEB) 0.5-2.5 (3) MG/3ML nebulizer solution 3 mL  3 mL Nebulization Q6H PRN Reubin Milan, MD      . mometasone-formoterol [facility]) 100-5 MCG/ACT inhaler 2 puff  2 puff Inhalation BID Reubin Milan, MD   2 puff at 12/27/19 [id number]  . multivitamin with minerals tablet 1 tablet  1 tablet Oral Daily Guilford Shi, MD   1 tablet at 12/26/19 1331  . ondansetron (ZOFRAN) tablet 4 mg  4 mg Oral Q6H PRN Reubin Milan, MD       Or  . ondansetron [facility]) injection 4 mg  4 mg Intravenous Q6H PRN Reubin Milan, MD      . oxyCODONE-acetaminophen (PERCOCET/ROXICET) 5-325 MG per tablet 1 tablet  1 tablet Oral Q6H PRN Florencia Reasons, MD   1 tablet at 12/25/19 2110  . senna-docusate (Senokot-S) tablet 1 tablet  1 tablet Oral Vonzell Schlatter, MD   1 tablet at 12/26/19 2242    Review of Systems  Constitutional: positive for anorexia, fatigue and weight loss Eyes: negative Ears, nose, mouth, throat, and face: negative Respiratory: positive for cough, dyspnea on exertion and pleurisy/chest pain Cardiovascular: negative Gastrointestinal: negative Genitourinary:negative Integument/breast: negative Hematologic/lymphatic: negative Musculoskeletal:negative Neurological: negative Behavioral/Psych: negative Endocrine: negative Allergic/Immunologic: negative  Physical Exam  BZJ:IRCVE, healthy, no distress and malnourished SKIN: skin color, texture, turgor are normal, no rashes or significant lesions HEAD: Normocephalic, No masses, lesions, tenderness or abnormalities EYES: normal, PERRLA, Conjunctiva are pink and non-injected EARS: External ears normal, Canals clear OROPHARYNX:no exudate, no erythema and lips, buccal mucosa, and tongue normal  NECK: supple, no adenopathy, no JVD LYMPH:  no palpable  lymphadenopathy, no hepatosplenomegaly LUNGS: clear to auscultation , and palpation HEART: regular rate & rhythm, no murmurs and no gallops ABDOMEN:abdomen soft, non-tender, normal bowel sounds and no masses or organomegaly BACK: No CVA tenderness, Range of motion is normal EXTREMITIES:no joint deformities, effusion, or inflammation, no edema  NEURO: alert & oriented x 3 with fluent speech, no focal motor/sensory deficits  PERFORMANCE STATUS: ECOG 1  LABORATORY DATA: Lab Results  Component Value Date   WBC 4.5 12/25/2019   HGB 12.8 (L) 12/25/2019   HCT 39.3 12/25/2019   MCV 71.6 (L) 12/25/2019   PLT 395 12/25/2019    @LASTCHEM @  RADIOGRAPHIC STUDIES: DG Chest 2 View  Result Date: 12/24/2019 CLINICAL DATA:  Upper back pain. EXAM: CHEST - 2 VIEW COMPARISON:  10/22/2017 FINDINGS: Heart is normal size. Large soft tissue mass in the right paratracheal region and medial left lower lobe. Left lung clear. No effusions or acute bony abnormality. IMPRESSION: Large right paratracheal soft tissue mass. Recommend further evaluation with chest  CT with IV contrast. Electronically Signed   By: Rolm Baptise M.D.   On: 12/24/2019 17:36   DG Cervical Spine Complete  Result Date: 12/24/2019 CLINICAL DATA:  Mid upper back pain.  No known injury. EXAM: CERVICAL SPINE - COMPLETE 4+ VIEW COMPARISON:  None. FINDINGS: Degenerative disc disease changes with disc space narrowing and anterior spurring. Bilateral degenerative facet disease. Severe bilateral neural foraminal narrowing at C4-5. No fracture or malalignment. Prevertebral soft tissues are normal. IMPRESSION: Degenerative disc and facet disease as above. No acute bony abnormality. Electronically Signed   By: Rolm Baptise M.D.   On: 12/24/2019 17:35   DG Thoracic Spine 2 View  Result Date: 12/24/2019 CLINICAL DATA:  Chest pain, upper back pain EXAM: THORACIC SPINE 2 VIEWS COMPARISON:  10/22/2017 FINDINGS: Soft tissue mass noted in the right paratracheal  region. This is new since prior study. No acute bony abnormality. No fracture or malalignment. No focal bone lesion. IMPRESSION: No acute bony abnormality. Large right paratracheal soft tissue mass. Recommend chest CT with IV contrast for further evaluation. Electronically Signed   By: Rolm Baptise M.D.   On: 12/24/2019 17:35   CT CHEST ABDOMEN PELVIS W CONTRAST  Result Date: 12/24/2019 CLINICAL DATA:  Right paratracheal soft tissue mass found on chest x-ray. EXAM: CT CHEST, ABDOMEN, AND PELVIS WITH CONTRAST TECHNIQUE: Multidetector CT imaging of the chest, abdomen and pelvis was performed following the standard protocol during bolus administration of intravenous contrast. CONTRAST:  144mL OMNIPAQUE IOHEXOL 300 MG/ML  SOLN COMPARISON:  May 30, 2008 FINDINGS: CT CHEST FINDINGS Cardiovascular: No significant vascular findings. Normal heart size. No pericardial effusion. Mediastinum/Nodes: A 6.7 cm x 6.8 cm x 8.6 cm lobulated heterogeneous low-attenuation right paratracheal soft tissue mass is seen. This extends from the right apex to the right suprahilar and precarinal regions. Thyroid gland, trachea, and esophagus demonstrate no significant findings. Lungs/Pleura: The lungs are hyperinflated. Moderate severity bullous changes are seen along the anterior aspect of the mid and upper left lung. Adjacent 6 mm and 4 mm noncalcified lung nodule is seen within the anterior aspect of the left upper lobe. There is no evidence of acute infiltrate, pleural effusion or pneumothorax. Musculoskeletal: No acute osseous abnormalities are identified. CT ABDOMEN PELVIS FINDINGS Hepatobiliary: A 7 mm cystic appearing area is seen within the anterior aspect of the left lobe of the liver. No gallstones, gallbladder wall thickening, or biliary dilatation. Pancreas: Unremarkable. No pancreatic ductal dilatation or surrounding inflammatory changes. Spleen: Normal in size without focal abnormality. Adrenals/Urinary Tract: Adrenal  glands are unremarkable. Kidneys are normal, without renal calculi, focal lesion, or hydronephrosis. Bladder is unremarkable. Stomach/Bowel: Stomach is within normal limits. Appendix appears normal. No evidence of bowel wall thickening, distention, or inflammatory changes. Vascular/Lymphatic: There is moderate severity calcification of the abdominal aorta and bilateral common iliac arteries, without evidence of aneurysmal dilatation. No enlarged abdominal or pelvic lymph nodes. Reproductive: There is moderate to marked severity prostate gland enlargement. Other: No abdominal wall hernia or abnormality. No abdominopelvic ascites. Musculoskeletal: Grade 1 anterolisthesis of the L5 vertebral body on S1 is seen. IMPRESSION: 1. 6.7 cm x 6.8 cm x 8.6 cm lobulated heterogeneous low-attenuation right paratracheal soft tissue mass. This extends from the right apex to the right suprahilar and precarinal regions and is consistent with a neoplastic process. 2. Adjacent 6 mm and 4 mm noncalcified lung nodule within the anterior aspect of the left upper lobe. Correlation with follow-up chest CT at 12 months is recommended. 3. Moderate severity  bullous changes along the anterior aspect of the mid and upper left lung. 4. Moderate to marked severity prostate gland enlargement. 5. Grade 1 anterolisthesis of the L5 vertebral body on S1. 6. Small cyst suspected within the left lobe of the liver. Aortic Atherosclerosis (ICD10-I70.0). Electronically Signed   By: Virgina Norfolk M.D.   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  The final pathology is still pending.   PLAN: I had a lengthy discussion with the patient today about his current disease status and further work-up as well as  treatment options. I recommended for the patient to complete the staging work-up by ordering MRI of the brain to rule out brain metastasis. I discussed with the patient briefly the treatment options including systemic chemotherapy with cisplatin 80 mg/M2 on day 1 and etoposide 100 mg/M2 on days 1, 2 and 3 every 3 weeks. I will arrange for the patient to come to the cancer center after discharge for more detailed discussion of his treatment option after confirmation of the tissue diagnosis. 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  We will continue to monitor closely but we expected to improve once the patient is started treatment. If there is no evidence of metastatic disease, the patient may also benefit from a course of concurrent radiotherapy during his systemic chemotherapy. I also strongly encouraged the patient to quit smoking. The patient voices understanding of current disease status and treatment options and is in agreement with the current care plan.  All questions were answered. The patient knows to call the clinic with any problems, questions or concerns. We can certainly see the patient much sooner if necessary.  Thank you so much for allowing me to participate in the care of [name] [name]. I will continue to follow up the patient with you and assist in his care.  Disclaimer: This note was dictated with voice recognition software. Similar sounding words can inadvertently be transcribed and may not be corrected upon review.   Eilleen Kempf December 27, 2019, 5:16 PM

## 2019-12-27 NOTE — Transfer of Care (Signed)
Immediate Anesthesia Transfer of Care Note  Patient: [name] [name]  Procedure(s) Performed: Procedure(s): ENDOBRONCHIAL ULTRASOUND (N/A) VIDEO BRONCHOSCOPY WITHOUT FLUORO (N/A) FINE NEEDLE ASPIRATION  Patient Location: PACU  Anesthesia Type:General  Level of Consciousness:  sedated, patient cooperative and responds to stimulation  Airway & Oxygen Therapy:Patient Spontanous Breathing and Patient connected to face mask oxgen  Post-op Assessment:  Report given to PACU RN and Post -op Vital signs reviewed and stable  Post vital signs:  Reviewed and stable  Last Vitals:  Vitals:   12/27/19 0727 12/27/19 0909  BP:  138/64  Pulse:  79  Resp:  20  Temp:  37.1 C  SpO2: 01% 58%    Complications: No apparent anesthesia complications

## 2019-12-27 NOTE — Anesthesia Postprocedure Evaluation (Signed)
Anesthesia Post Note  Patient: [name] [name]  Procedure(s) Performed: ENDOBRONCHIAL ULTRASOUND (N/A ) VIDEO BRONCHOSCOPY WITHOUT FLUORO (N/A ) FINE NEEDLE ASPIRATION     Patient location during evaluation: PACU Anesthesia Type: General Level of consciousness: awake and alert and oriented Pain management: pain level controlled Vital Signs Assessment: post-procedure vital signs reviewed and stable Respiratory status: spontaneous breathing, nonlabored ventilation and respiratory function stable Cardiovascular status: blood pressure returned to baseline and stable Postop Assessment: no apparent nausea or vomiting Anesthetic complications: no   No complications documented.  Last Vitals:  Vitals:   12/27/19 1110 12/27/19 1120  BP: (!) 184/72 (!) 171/84  Pulse: 94 92  Resp: 16 17  Temp:    SpO2: 100% 94%    Last Pain:  Vitals:   12/27/19 1120  TempSrc:   PainSc: 0-No pain                 [name]

## 2019-12-27 NOTE — Progress Notes (Addendum)
PROGRESS NOTE    [name] [name]  FIE:[id number]  DOB: 26-Mar-1956  PCP: [name], NP Admit date:12/24/2019 Chief compliant: back/shoulder pain 64 y.o.malewith h/o HTN,COPD/ emphysema, tobacco use,  Presented with complaints of right shoulder and thoracic back pain for 3 to 4 weeks associated with occasional dry cough, pleuritic chest wall pain, loss of appetite and insomnia. ED Course: Afebrile, O2 sat 99% on room air.WBC 5.8, hemoglobin 13.8 g/dL and platelets 434. Troponin x2 normal. Sodium was 124, potassium 4.1, chloride 90 and CO2 24 mmol/L. Glucose 86, BUN 5 and creatinine 0.59 mg/dL. His calcium is 9.3 mg/dL.Imaging: Lumbar and thoracic spine showed degenerative changes. 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 CT chest/abdomen/pelvis is concerning for neoplastic process. Bronchoscopy with biopsy planned as inpatient.  Subjective:  Patient underwent bronchoscopy today.  Tolerated procedure well.  Saturating well on room air.  Objective: Vitals:   12/27/19 1120 12/27/19 1130 12/27/19 1140 12/27/19 1202  BP: (!) 171/84 (!) 172/82 (!) 143/82 (!) 143/85  Pulse: 92 91 87 93  Resp: 17 12 20 18   Temp:    (!) 97.5 F (36.4 C)  TempSrc:    Oral  SpO2: 94% 94% 94% 97%  Weight:      Height:        Intake/Output Summary (Last 24 hours) at 12/27/2019 1234 Last data filed at 12/27/2019 1660 Gross per 24 hour  Intake 950.1 ml  Output 1900 ml  Net -949.9 ml   Filed Weights   12/25/19 0141 12/27/19 0909  Weight: 52.8 kg 59 kg    Physical Examination:  General: Thin built, no acute distress noted Head ENT: Atraumatic normocephalic, PERRLA, neck supple Heart: S1-S2 heard, regular rate and rhythm, no murmurs.  No leg edema noted Lungs: Equal air entry bilaterally, no rhonchi or rales on exam, no accessory muscle use Abdomen: Bowel sounds heard, soft,  nontender, nondistended. No organomegaly.  No CVA tenderness Extremities: No pedal edema.  No cyanosis or clubbing. Neurological: Awake alert oriented x3, no focal weakness or numbness, strength and sensations to crude touch intact Skin: No wounds or rashes.   Data Reviewed: I have personally reviewed following labs and imaging studies  CBC: Recent Labs  Lab 12/24/19 1626 12/25/19 0515  WBC 5.8 4.5  HGB 13.8 12.8*  HCT 43.1 39.3  MCV 71.4* 71.6*  PLT 434* 630   Basic Metabolic Panel: Recent Labs  Lab 12/24/19 1626 12/25/19 0515 12/26/19 0419 12/27/19 0831  NA 124* 126* 128* 131*  K 4.1 4.3 3.8 3.9  CL 90* 93* 97* 99  CO2 24 22 23 22   GLUCOSE 86 80 99 102*  BUN 5* <5* 6* 7*  CREATININE 0.59* 0.66 0.62 0.55*  CALCIUM 9.3 9.0 8.5* 9.0   GFR: Estimated Creatinine Clearance: 70.8 mL/min (A) (by C-G formula based on SCr of 0.55 mg/dL (L)). Liver Function Tests: Recent Labs  Lab 12/25/19 0515  AST 17  ALT 10  ALKPHOS 56  BILITOT 0.6  PROT 6.6  ALBUMIN 3.4*   No results for input(s): LIPASE, AMYLASE in the last 168 hours. No results for input(s): AMMONIA in the last 168 hours. Coagulation Profile: Recent Labs  Lab 12/25/19 0515  INR 1.1   Cardiac Enzymes: No results for input(s): CKTOTAL, CKMB, CKMBINDEX, TROPONINI in the last 168 hours. BNP (last 3 results) No results for input(s): PROBNP in the last 8760 hours. HbA1C: No results for input(s):  HGBA1C in the last 72 hours. CBG: No results for input(s): GLUCAP in the last 168 hours. Lipid Profile: No results for input(s): CHOL, HDL, LDLCALC, TRIG, CHOLHDL, LDLDIRECT in the last 72 hours. Thyroid Function Tests: No results for input(s): TSH, T4TOTAL, FREET4, T3FREE, THYROIDAB in the last 72 hours. Anemia Panel: No results for input(s): VITAMINB12, FOLATE, FERRITIN, TIBC, IRON, RETICCTPCT in the last 72 hours. Sepsis Labs: No results for input(s): PROCALCITON, LATICACIDVEN in the last 168 hours.  Recent  Results (from the past 240 hour(s))  SARS Coronavirus 2 by RT PCR (hospital order, performed in [facility] hospital lab) Nasopharyngeal Nasopharyngeal Swab     Status: None   Collection Time: 12/25/19 12:09 AM   Specimen: Nasopharyngeal Swab  Result Value Ref Range Status   SARS Coronavirus 2 NEGATIVE NEGATIVE Final    Comment: (NOTE) SARS-CoV-2 target nucleic acids are NOT DETECTED.  The SARS-CoV-2 RNA is generally detectable in upper and lower respiratory specimens during the acute phase of infection. The lowest concentration of SARS-CoV-2 viral copies this assay can detect is 250 copies / mL. A negative result does not preclude SARS-CoV-2 infection and should not be used as the sole basis for treatment or other patient management decisions.  A negative result may occur with improper specimen collection / handling, submission of specimen other than nasopharyngeal swab, presence of viral mutation(s) within the areas targeted by this assay, and inadequate number of viral copies (<250 copies / mL). A negative result must be combined with clinical observations, patient history, and epidemiological information.  Fact Sheet for Patients:   StrictlyIdeas.no  Fact Sheet for Healthcare Providers: BankingDealers.co.za  This test is not yet approved or  cleared by the [place] FDA and has been authorized for detection and/or diagnosis of SARS-CoV-2 by FDA under an Emergency Use Authorization (EUA).  This EUA will remain in effect (meaning this test can be used) for the duration of the COVID-19 declaration under Section 564(b)(1) of the Act, 21 U.S.C. section 360bbb-3(b)(1), unless the authorization is terminated or revoked sooner.  Performed at [facility], [place] [address] [place], [place] [address]       Radiology Studies: No results found.    Scheduled Meds: . aspirin EC  81 mg Oral Daily  . feeding  supplement (ENSURE ENLIVE)  237 mL Oral BID BM  . guaiFENesin  600 mg Oral BID  . mometasone-formoterol  2 puff Inhalation BID  . multivitamin with minerals  1 tablet Oral Daily  . senna-docusate  1 tablet Oral QHS   Continuous Infusions: . sodium chloride 75 mL/hr at 12/26/19 1330     Assessment/Plan:  1.  Right paratracheal mass: Presenting symptoms of cough, weight loss, lack of appetite, pleuritic pain appeared to be explained by this finding.  Seen by pulmonary and underwent bronchoscopy today revealing mediastinal mass with mild tracheal stenosis, adenopathy which was biopsied..  Cough improving, nebs as needed.  Will initiate oncology referral.  2.  Hyponatremia: Urine sodium 76, urine creatinine 149, urine specific gravity on presentation was 1.043.  Uric acid now back 4.5, likely not SIADH.  Continue IV fluids for now as sodium appears to be steadily improving and is at 131 today.  3.  COPD: Severe bullous changes noted on CT scan, stable.  Nebs as needed.  4.  Hypertension: Home med Cozaar been on hold and normotensive.  5.  BPH: Prostrate enlargement noted on CT but asymptomatic.  Follow-up PCP/urology as outpatient.  6.  Microcytic anemia:  Hemoglobin stable around 12.8.  7.  Back pain: Could be secondary to DDD at L5-S1 as noted on imaging studies.  Symptomatic management.  May need PET scan as outpatient.  DVT prophylaxis: SCDs, chemoprophylaxis on hold and plan for bronchoscopy Code Status: Full code Family / Patient Communication: Discussed with patient Disposition Plan:   Status is: Inpatient  Remains inpatient appropriate because:Inpatient level of care appropriate due to severity of illness, IV fluids, close sodium monitoring, plan for bronchoscopy   Dispo: The patient is from: Home              Anticipated d/c is to: Home              Anticipated d/c date is: 8/11 as discussed with pulmonary              Patient currently is not medically stable to  d/c.         Time spent: 25 minutes     >50% time spent in discussions with care team and coordination of care.    Guilford Shi, MD [facility] Pager in [place]  If 7PM-7AM, please contact night-coverage [email or web address] 12/27/2019, 12:34 PM

## 2019-12-27 NOTE — Anesthesia Procedure Notes (Signed)
Procedure Name: Intubation Date/Time: 12/27/2019 10:03 AM Performed by: Lavina Hamman, CRNA Pre-anesthesia Checklist: Patient identified, Emergency Drugs available, Suction available, Patient being monitored and Timeout performed Patient Re-evaluated:Patient Re-evaluated prior to induction Oxygen Delivery Method: Circle system utilized Preoxygenation: Pre-oxygenation with 100% oxygen Induction Type: IV induction and Cricoid Pressure applied Ventilation: Mask ventilation without difficulty Laryngoscope Size: Mac and 3 Grade View: Grade II Tube type: Oral Tube size: 9.0 mm Number of attempts: 1 Airway Equipment and Method: Stylet Placement Confirmation: ETT inserted through vocal cords under direct vision,  positive ETCO2,  CO2 detector and breath sounds checked- equal and bilateral Secured at: 20 cm Tube secured with: Tape Dental Injury: Teeth and Oropharynx as per pre-operative assessment  Comments: Patient hoarse preop, complaint of some difficulty swallowing and GERD, easy mask, ATOI, no mass in AW.

## 2019-12-27 NOTE — Op Note (Signed)
[facility] Cardiopulmonary Patient Name: [name] [name] Procedure Date: 12/27/2019 MRN: [id number] Attending MD: Laurin Coder MD, MD Date of Birth: [birth year] CSN: [id number] Age: 64 Admit Type: Inpatient Ethnicity: Not Hispanic or Latino Procedure:             Bronchoscopy Indications:           Mediastinal mass, Mediastinal adenopathy Providers:             Noam Franzen A. Ander Slade MD, MD, Tyna Jaksch Technician,                         Cletis Athens, Technician Referring MD:           Medicines:              Complications:         No immediate complications Estimated Blood Loss:  Estimated blood loss was minimal. Procedure:      Pre-Anesthesia Assessment:      - The heart rate, respiratory rate, oxygen saturations, blood pressure,       adequacy of pulmonary ventilation, and response to care were monitored       throughout the procedure.      After obtaining informed consent, the bronchoscope was passed under       direct vision. Throughout the procedure, the patient's blood pressure,       pulse, and oxygen saturations were monitored continuously. the BF-H190       ([id number]) Olympus Bronchoscope was introduced through the mouth, via the       endotracheal tube (the patient was intubated for the procedure) and       advanced to the tracheobronchial tree of both lungs. the Bronchoscope       was introduced through the mouth, via the endotracheal tube (the patient       was intubated for the procedure) and advanced to the tracheobronchial       tree of both lungs. The procedure was accomplished without difficulty.       The patient tolerated the procedure well. The procedure was accomplished       without difficulty. The patient tolerated the procedure well. Findings:      The laryngeal mask airway is in good position. The vocal cords appear       normal. The subglottic space is normal. The trachea is of normal       caliber. The carina is sharp. The  tracheobronchial tree was examined to       at least the first subsegmental level. Bronchial mucosa and anatomy are       normal; there are no endobronchial lesions, and no secretions. Narrowing       was found in the trachea. The narrowing appears to be from an extrinsic       mass. The airway is minimally narrowed. The lesion was successfully       traversed.      The scope was withdrawn and replaced with the EBUS bronchoscope to       accomplish the ultrasound examination.      Lymph Nodes: An endobronchial ultrasound endoscope was utilized to       systematically examine the subcarinal mediastinum (level 7) in order to       assist with fine needle aspiration. Lymph node sizing was performed via       endobronchial ultrasound for suspected lung cancer. Sampling by  transbronchial needle aspiration was also performed using a [employer] 22 gauge needle in the subcarinal mediastinum (level       7) and sent for routine cytology. .      Needle aspiration of paratracheal mass Impression:      - Mediastinal mass      - Mediastinal adenopathy      - The airway examination was normal.      - A narrowing was found in the trachea. The narrowing appears to be from       an extrinsic mass.      - Endobronchial ultrasound was performed.      - Lymph node sizing and sampling was performed. Moderate Sedation:      An independent trained observer was present and continuously monitored       the patient. Recommendation:      - Await cytology results. Procedure Code(s):      --- Professional ---      [phone], Bronchoscopy, rigid or flexible, including fluoroscopic guidance,       when performed; with endobronchial ultrasound (EBUS) guided       transtracheal and/or transbronchial sampling (eg,       aspiration[s]/biopsy[ies]), one or two mediastinal and/or hilar lymph       node stations or structures Diagnosis Code(s):      --- Professional ---      R59.0, Localized  enlarged lymph nodes      R93.89, Abnormal findings on diagnostic imaging of other specified body       structures      J98.4, Other disorders of lung CPT copyright 2019 American Medical Association. All rights reserved. The codes documented in this report are preliminary and upon coder review may  be revised to meet current compliance requirements. Sherrilyn Rist, MD Laurin Coder MD, MD 12/27/2019 11:08:12 AM This report has been signed electronically. Number of Addenda: 0 Scope In: Scope Out:

## 2019-12-27 NOTE — Interval H&P Note (Signed)
History and Physical Interval Note:  12/27/2019 9:21 AM  [name] [name]  has presented today for surgery, with the diagnosis of mediastinal mass.  The various methods of treatment have been discussed with the patient and family. After consideration of risks, benefits and other options for treatment, the patient has consented to  Procedure(s): ENDOBRONCHIAL ULTRASOUND (N/A) VIDEO BRONCHOSCOPY WITHOUT FLUORO (N/A) as a surgical intervention.  The patient's history has been reviewed, patient examined, no change in status, stable for surgery.  I have reviewed the patient's chart and labs.  Questions were answered to the patient's satisfaction.   He had an uneventful night, no complaints or concerns,   Agreeable to proceed with bronchoscopy   Celester Lech A Yochanan Eddleman

## 2019-12-28 ENCOUNTER — Telehealth: Payer: Self-pay | Admitting: Internal Medicine

## 2019-12-28 ENCOUNTER — Inpatient Hospital Stay (HOSPITAL_COMMUNITY): Payer: Self-pay

## 2019-12-28 DIAGNOSIS — E440 Moderate protein-calorie malnutrition: Secondary | ICD-10-CM

## 2019-12-28 DIAGNOSIS — C3490 Malignant neoplasm of unspecified part of unspecified bronchus or lung: Secondary | ICD-10-CM

## 2019-12-28 LAB — BASIC METABOLIC PANEL
Anion gap: 9 (ref 5–15)
BUN: 8 mg/dL (ref 8–23)
CO2: 24 mmol/L (ref 22–32)
Calcium: 9.2 mg/dL (ref 8.9–10.3)
Chloride: 102 mmol/L (ref 98–111)
Creatinine, Ser: 0.62 mg/dL (ref 0.61–1.24)
GFR calc Af Amer: >60 mL/min (ref >60)
GFR calc non Af Amer: >60 mL/min (ref >60)
Glucose, Bld: 94 mg/dL (ref 70–99)
Potassium: 4.3 mmol/L (ref 3.5–5.1)
Sodium: 135 mmol/L (ref 135–145)

## 2019-12-28 MED ORDER — IPRATROPIUM-ALBUTEROL 20-100 MCG/ACT IN AERS: 1.0000 | INHALATION_SPRAY | Freq: Four times a day (QID) | RESPIRATORY_TRACT | 1 refills | Status: DC | PRN

## 2019-12-28 MED ORDER — SODIUM CHLORIDE (PF) 0.9 % IJ SOLN
INTRAMUSCULAR | Status: AC
  Filled 2019-12-28: qty 50

## 2019-12-28 MED ORDER — ENSURE ENLIVE PO LIQD: 237.0000 mL | Freq: Two times a day (BID) | ORAL | 12 refills | Status: DC

## 2019-12-28 MED ORDER — GUAIFENESIN ER 600 MG PO TB12: 600.0000 mg | ORAL_TABLET | Freq: Two times a day (BID) | ORAL | 0 refills | Status: AC

## 2019-12-28 MED ORDER — IOHEXOL 300 MG/ML  SOLN
100.0000 mL | Freq: Once | INTRAMUSCULAR | Status: AC | PRN
  Administered 2019-12-28: 75 mL via INTRAVENOUS

## 2019-12-28 MED ORDER — SENNOSIDES-DOCUSATE SODIUM 8.6-50 MG PO TABS: 1.0000 | ORAL_TABLET | Freq: Every day | ORAL | Status: DC

## 2019-12-28 MED FILL — SPIRIVA 18 MCG CP-HANDIHALE: 18 | 30 days supply | Qty: 30 | Fill #0

## 2019-12-28 MED FILL — !VENTOLIN HFA INHALER: 108 (90 BAS | 16 days supply | Qty: 18 | Fill #0

## 2019-12-28 NOTE — Discharge Summary (Addendum)
Physician Discharge Summary  [name] [name] RKY:[id number] DOB: 12-Jan-1956 DOA: 12/24/2019  PCP: [name], NP  Admit date: 12/24/2019 Discharge date: 12/28/2019 Consultations: Pulmonary,  Oncology Admitted From: home Disposition: home  Discharge Diagnoses:  Principal Problem:   Tracheal mass Active Problems:   Tobacco use   COPD (chronic obstructive pulmonary disease) ([place])   Hypertension   Hyponatremia   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[place])   Malnutrition of moderate degree   Hospital Course Summary: 64 y.o.malewith h/o HTN,COPD/ emphysema, tobacco use,  Presented with complaints of right shoulder and thoracic back pain for 3 to 4 weeks associated with occasional dry cough, pleuritic chest wall pain, loss of appetite and insomnia. ED Course: Afebrile, O2 sat 99% on room air.WBC 5.8, hemoglobin 13.8 g/dL and platelets 434. Troponin x2 normal. Sodium was 124, potassium 4.1, chloride 90 and CO2 24 mmol/L. Glucose 86, BUN 5 and creatinine 0.59 mg/dL. His calcium is 9.3 mg/dL.Imaging: Lumbar and thoracic spine showed degenerative changes. Chest radiograph was significant for tracheal mass,  Hospital course: Patient admitted to [facility] for further evaluation of hyponatremia and malignancy w/u. CT chest/abdomen/pelvis is concerning for neoplastic process. Bronchoscopy with biopsy planned as inpatient.  1.  Right paratracheal mass: Presenting symptoms of cough, weight loss, lack of appetite, pleuritic pain appeared to be explained by this finding.  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  Initiated oncology referral-seen by Dr Inda Merlin as inpatient and set up through clinic for chemo/radiation options. MRI head for metastatic w/u ordered but per radiology unsafe with prior aneurysmal clip. Follow up as outpatient.  2.  Hyponatremia:  secondary to poor oral intake/dehydration. Urine sodium 76, urine creatinine 149, urine specific gravity on presentation was 1.043.  Uric acid now back 4.5, likely not SIADH.  Patient improved steadily with  IV fluids sodium  at 135 today.  3.  COPD: Severe bullous changes noted on CT scan, stable. Resume home inhaler therapy, changed albuterol to combivent as needed.  4.  Hypertension: Home med Cozaar been on hold and normotensive.  5.  BPH: Prostrate enlargement noted on CT but asymptomatic.  Follow-up PCP/urology as outpatient.  6.  Microcytic anemia: Hemoglobin stable around 12.8.  7.  Back pain: Could be secondary to DDD at L5-S1 as noted on imaging studies.  Symptomatic management.  May need PET scan as outpatient.  Discharge Exam:   Vitals:   12/27/19 2022 12/27/19 2124 12/28/19 0630 12/28/19 0802  BP:  (!) 148/77 138/78   Pulse:  96 89   Resp:  20 18   Temp:  99.2 F (37.3 C) 98.9 F (37.2 C)   TempSrc:  Oral Oral   SpO2: 99% 98% 100% 98%  Weight:      Height:        General: Pt is alert, awake, not in acute distress Cardiovascular: RRR, S1/S2 +, no rubs, no gallops Respiratory: CTA bilaterally, no wheezing, no rhonchi Abdominal: Soft, NT, ND, bowel sounds + Extremities: no edema, no cyanosis  Discharge Condition:Stable CODE STATUS: Full code Diet recommendation: heart healthy Recommendations for Outpatient Follow-up:  1. Follow up with PCP: 5 days 2. Follow up with consultants: Dr Inda Merlin , Oncology as scheduled 3. Please obtain follow up labs including: [place] services upon discharge: none Equipment/Devices upon discharge:none   Discharge Instructions:  Discharge Instructions    Call MD for:  difficulty breathing,  headache or visual disturbances   Complete by: As directed    Call MD for:  extreme fatigue   Complete by: As directed    Call MD for:  persistant dizziness or light-headedness   Complete by: As directed    Call MD for:   persistant nausea and vomiting   Complete by: As directed    Call MD for:  severe uncontrolled pain   Complete by: As directed    Call MD for:  temperature >100.4   Complete by: As directed    Diet - low sodium heart healthy   Complete by: As directed    Increase activity slowly   Complete by: As directed      Allergies as of 12/28/2019   No Known Allergies     Medication List    STOP taking these medications   albuterol 108 (90 Base) MCG/ACT inhaler Commonly known as: VENTOLIN HFA   losartan 25 MG tablet Commonly known as: COZAAR     TAKE these medications   acetaminophen 500 MG tablet Commonly known as: TYLENOL Take 500 mg by mouth every 6 (six) hours as needed for mild pain or moderate pain.   aspirin 81 MG EC tablet Take 1 tablet (81 mg total) by mouth daily.   budesonide-formoterol 80-4.5 MCG/ACT inhaler Commonly known as: SYMBICORT Inhale 2 puffs into the lungs 2 (two) times daily. What changed:   when to take this  reasons to take this   feeding supplement (ENSURE ENLIVE) Liqd Take 237 mLs by mouth 2 (two) times daily between meals.   guaiFENesin 600 MG 12 hr tablet Commonly known as: MUCINEX Take 1 tablet (600 mg total) by mouth 2 (two) times daily for 5 days.   Ipratropium-Albuterol 20-100 MCG/ACT Aers respimat Commonly known as: COMBIVENT Inhale 1 puff into the lungs every 6 (six) hours as needed for wheezing or shortness of breath.   multivitamin tablet Take 1 tablet by mouth daily.   senna-docusate 8.6-50 MG tablet Commonly known as: Senokot-S Take 1 tablet by mouth at bedtime.       No Known Allergies    The results of significant diagnostics from this hospitalization (including imaging, microbiology, ancillary and laboratory) are listed below for reference.    Labs: BNP (last 3 results) No results for input(s): BNP in the last 8760 hours. Basic Metabolic Panel: Recent Labs  Lab 12/24/19 1626 12/25/19 0515 12/26/19 0419  12/27/19 0831 12/28/19 0456  NA 124* 126* 128* 131* 135  K 4.1 4.3 3.8 3.9 4.3  CL 90* 93* 97* 99 102  CO2 24 22 23 22 24   GLUCOSE 86 80 99 102* 94  BUN 5* <5* 6* 7* 8  CREATININE 0.59* 0.66 0.62 0.55* 0.62  CALCIUM 9.3 9.0 8.5* 9.0 9.2   Liver Function Tests: Recent Labs  Lab 12/25/19 0515  AST 17  ALT 10  ALKPHOS 56  BILITOT 0.6  PROT 6.6  ALBUMIN 3.4*   No results for input(s): LIPASE, AMYLASE in the last 168 hours. No results for input(s): AMMONIA in the last 168 hours. CBC: Recent Labs  Lab 12/24/19 1626 12/25/19 0515  WBC 5.8 4.5  HGB 13.8 12.8*  HCT 43.1 39.3  MCV 71.4* 71.6*  PLT 434* 395   Cardiac Enzymes: No results for input(s): CKTOTAL, CKMB, CKMBINDEX, TROPONINI in the last 168 hours. BNP: Invalid input(s): POCBNP CBG: No results for input(s): GLUCAP in the last 168 hours. D-Dimer No results for input(s): DDIMER in the last 72 hours. Hgb  A1c No results for input(s): HGBA1C in the last 72 hours. Lipid Profile No results for input(s): CHOL, HDL, LDLCALC, TRIG, CHOLHDL, LDLDIRECT in the last 72 hours. Thyroid function studies No results for input(s): TSH, T4TOTAL, T3FREE, THYROIDAB in the last 72 hours.  Invalid input(s): FREET3 Anemia work up No results for input(s): VITAMINB12, FOLATE, FERRITIN, TIBC, IRON, RETICCTPCT in the last 72 hours. Urinalysis    Component Value Date/Time   COLORURINE YELLOW 12/24/2019 1953   APPEARANCEUR CLEAR 12/24/2019 1953   LABSPEC 1.043 (H) 12/24/2019 1953   PHURINE 6.0 12/24/2019 1953   GLUCOSEU NEGATIVE 12/24/2019 1953   HGBUR SMALL (A) 12/24/2019 [place] NEGATIVE 12/24/2019 1953   KETONESUR 5 (A) 12/24/2019 1953   PROTEINUR NEGATIVE 12/24/2019 1953   UROBILINOGEN 1.0 05/28/2008 1859   NITRITE NEGATIVE 12/24/2019 1953   LEUKOCYTESUR TRACE (A) 12/24/2019 1953   Sepsis Labs Invalid input(s): PROCALCITONIN,  WBC,  LACTICIDVEN Microbiology Recent Results (from the past 240 hour(s))  SARS  Coronavirus 2 by RT PCR (hospital order, performed in [place] hospital lab) Nasopharyngeal Nasopharyngeal Swab     Status: None   Collection Time: 12/25/19 12:09 AM   Specimen: Nasopharyngeal Swab  Result Value Ref Range Status   SARS Coronavirus 2 NEGATIVE NEGATIVE Final    Comment: (NOTE) SARS-CoV-2 target nucleic acids are NOT DETECTED.  The SARS-CoV-2 RNA is generally detectable in upper and lower respiratory specimens during the acute phase of infection. The lowest concentration of SARS-CoV-2 viral copies this assay can detect is 250 copies / mL. A negative result does not preclude SARS-CoV-2 infection and should not be used as the sole basis for treatment or other patient management decisions.  A negative result may occur with improper specimen collection / handling, submission of specimen other than nasopharyngeal swab, presence of viral mutation(s) within the areas targeted by this assay, and inadequate number of viral copies (<250 copies / mL). A negative result must be combined with clinical observations, patient history, and epidemiological information.  Fact Sheet for Patients:   StrictlyIdeas.no  Fact Sheet for Healthcare Providers: BankingDealers.co.za  This test is not yet approved or  cleared by the [place] FDA and has been authorized for detection and/or diagnosis of SARS-CoV-2 by FDA under an Emergency Use Authorization (EUA).  This EUA will remain in effect (meaning this test can be used) for the duration of the COVID-19 declaration under Section 564(b)(1) of the Act, 21 U.S.C. section 360bbb-3(b)(1), unless the authorization is terminated or revoked sooner.  Performed at [facility], [place] [address] [place], [place] [address]     Procedures/Studies: DG Chest 2 View  Result Date: 12/24/2019 CLINICAL DATA:  Upper back pain. EXAM: CHEST - 2 VIEW COMPARISON:  10/22/2017 FINDINGS: Heart  is normal size. Large soft tissue mass in the right paratracheal region and medial left lower lobe. Left lung clear. No effusions or acute bony abnormality. IMPRESSION: Large right paratracheal soft tissue mass. Recommend further evaluation with chest CT with IV contrast. Electronically Signed   By: Rolm Baptise M.D.   On: 12/24/2019 17:36   DG Cervical Spine Complete  Result Date: 12/24/2019 CLINICAL DATA:  Mid upper back pain.  No known injury. EXAM: CERVICAL SPINE - COMPLETE 4+ VIEW COMPARISON:  None. FINDINGS: Degenerative disc disease changes with disc space narrowing and anterior spurring. Bilateral degenerative facet disease. Severe bilateral neural foraminal narrowing at C4-5. No fracture or malalignment. Prevertebral soft tissues are normal. IMPRESSION: Degenerative disc and facet disease as  above. No acute bony abnormality. Electronically Signed   By: Rolm Baptise M.D.   On: 12/24/2019 17:35   DG Thoracic Spine 2 View  Result Date: 12/24/2019 CLINICAL DATA:  Chest pain, upper back pain EXAM: THORACIC SPINE 2 VIEWS COMPARISON:  10/22/2017 FINDINGS: Soft tissue mass noted in the right paratracheal region. This is new since prior study. No acute bony abnormality. No fracture or malalignment. No focal bone lesion. IMPRESSION: No acute bony abnormality. Large right paratracheal soft tissue mass. Recommend chest CT with IV contrast for further evaluation. Electronically Signed   By: Rolm Baptise M.D.   On: 12/24/2019 17:35   CT CHEST ABDOMEN PELVIS W CONTRAST  Result Date: 12/24/2019 CLINICAL DATA:  Right paratracheal soft tissue mass found on chest x-ray. EXAM: CT CHEST, ABDOMEN, AND PELVIS WITH CONTRAST TECHNIQUE: Multidetector CT imaging of the chest, abdomen and pelvis was performed following the standard protocol during bolus administration of intravenous contrast. CONTRAST:  168mL OMNIPAQUE IOHEXOL 300 MG/ML  SOLN COMPARISON:  May 30, 2008 FINDINGS: CT CHEST FINDINGS Cardiovascular: No  significant vascular findings. Normal heart size. No pericardial effusion. Mediastinum/Nodes: A 6.7 cm x 6.8 cm x 8.6 cm lobulated heterogeneous low-attenuation right paratracheal soft tissue mass is seen. This extends from the right apex to the right suprahilar and precarinal regions. Thyroid gland, trachea, and esophagus demonstrate no significant findings. Lungs/Pleura: The lungs are hyperinflated. Moderate severity bullous changes are seen along the anterior aspect of the mid and upper left lung. Adjacent 6 mm and 4 mm noncalcified lung nodule is seen within the anterior aspect of the left upper lobe. There is no evidence of acute infiltrate, pleural effusion or pneumothorax. Musculoskeletal: No acute osseous abnormalities are identified. CT ABDOMEN PELVIS FINDINGS Hepatobiliary: A 7 mm cystic appearing area is seen within the anterior aspect of the left lobe of the liver. No gallstones, gallbladder wall thickening, or biliary dilatation. Pancreas: Unremarkable. No pancreatic ductal dilatation or surrounding inflammatory changes. Spleen: Normal in size without focal abnormality. Adrenals/Urinary Tract: Adrenal glands are unremarkable. Kidneys are normal, without renal calculi, focal lesion, or hydronephrosis. Bladder is unremarkable. Stomach/Bowel: Stomach is within normal limits. Appendix appears normal. No evidence of bowel wall thickening, distention, or inflammatory changes. Vascular/Lymphatic: There is moderate severity calcification of the abdominal aorta and bilateral common iliac arteries, without evidence of aneurysmal dilatation. No enlarged abdominal or pelvic lymph nodes. Reproductive: There is moderate to marked severity prostate gland enlargement. Other: No abdominal wall hernia or abnormality. No abdominopelvic ascites. Musculoskeletal: Grade 1 anterolisthesis of the L5 vertebral body on S1 is seen. IMPRESSION: 1. 6.7 cm x 6.8 cm x 8.6 cm lobulated heterogeneous low-attenuation right paratracheal  soft tissue mass. This extends from the right apex to the right suprahilar and precarinal regions and is consistent with a neoplastic process. 2. Adjacent 6 mm and 4 mm noncalcified lung nodule within the anterior aspect of the left upper lobe. Correlation with follow-up chest CT at 12 months is recommended. 3. Moderate severity bullous changes along the anterior aspect of the mid and upper left lung. 4. Moderate to marked severity prostate gland enlargement. 5. Grade 1 anterolisthesis of the L5 vertebral body on S1. 6. Small cyst suspected within the left lobe of the liver. Aortic Atherosclerosis (ICD10-I70.0). Electronically Signed   By: Virgina Norfolk M.D.   On: 12/24/2019 19:39     Time coordinating discharge: Over 30 minutes  SIGNED:   Guilford Shi, MD  [facility] 12/28/2019, 9:34 AM

## 2019-12-28 NOTE — Progress Notes (Signed)
D/C instructions given to patient. Patient had no questions. NT or writer will wheel patient out once his ride is here to pick him up

## 2019-12-28 NOTE — Progress Notes (Signed)
Provider made aware that MRI got cancelled d/t pt has unsafe aneurysm clip.

## 2019-12-28 NOTE — Telephone Encounter (Signed)
Scheduled appt per 8/10 sch msg - pt is aware of appt date and time - appt should also show up on discharge papers.

## 2019-12-28 NOTE — Progress Notes (Signed)
Patient order for MRi Brain. Per Dr Nevada Crane, Neuroradiologist, pt has unsafe Aneurysm clip and is not a candidate for MRI. Spoke to [employer]

## 2019-12-28 NOTE — Progress Notes (Signed)
   NAME:  [name] [name], MRN:  [id number], DOB:  10/17/1955, LOS: 3 ADMISSION DATE:  12/24/2019, CONSULTATION DATE: 12/25/2019 REFERRING MD: Dr. Earnest Conroy, CHIEF COMPLAINT: back, neck and chest pain  Brief History   64 year old cachectic male who is a lifelong smoker of less than 10 cigarettes a day and is retired. He notes ongoing weight loss and severe back and neck pain over the last several weeks. He denies hemoptysis productive cough fevers chills sweats or any other malaise chest and back pain. He presented to [facility] underwent CT of the chest which revealed extensive paratracheal mass. Pulmonary critical care was asked to evaluate for possible fiberoptic bronchoscopy.  Post bronchoscopy on 8/10 Past Medical History   Past Medical History:  Diagnosis Date  . 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[place]) 12/25/2019  . COPD (chronic obstructive pulmonary disease) ([place]) 11/02/2007   Qualifier: Diagnosis of  By: Melvyn Novas MD, Christena Deem   . Hypertension 12/24/2019  . Iron deficiency anemia 08/23/2007   Qualifier: Diagnosis of  By: Jim Like     [facility] Events   No significant events  Consults:    Procedures:    Significant Diagnostic Tests:  CT scan of the chest reviewed showing large mediastinal mass  Micro Data:   Antimicrobials:  None  Interim history/subjective:  No overnight events Chest heaviness No hemoptysis Objective   Blood pressure 138/78, pulse 89, temperature 98.9 F (37.2 C), temperature source Oral, resp. rate 18, height 5\' 1"  (1.549 m), weight 59 kg, SpO2 98 %.        Intake/Output Summary (Last 24 hours) at 12/28/2019 1302 Last data filed at 12/28/2019 1000 Gross per 24 hour  Intake 2170.25 ml  Output 1450 ml  Net 720.25 ml   Filed Weights   12/25/19 0141 12/27/19 0909  Weight: 52.8 kg 59 kg    Examination: General: Cachectic HENT: Dry oral mucosa Lungs: Decreased air movement, mild rhonchi Cardiovascular: S1-S2 appreciated Abdomen: Bowel  sounds appreciated Extremities: No clubbing, no edema Neuro: Alert and oriented x3 GU:   Resolved Hospital Problem list     Assessment & Plan:  Large mediastinal mass Active smoker Chronic obstructive pulmonary disease Hypertension Hyponatremia Emphysema  Patient has a large paratracheal mass -Post bronchoscopy 8/10  Awaiting final pathology findings  May be discharged home from pulmonary perspective  He is already evaluated by oncology and he will follow up  Salley Hews PCCM Pager: [phone]

## 2019-12-29 LAB — CYTOLOGY - NON PAP

## 2019-12-30 ENCOUNTER — Telehealth: Payer: Self-pay

## 2019-12-30 NOTE — Telephone Encounter (Signed)
Transition Care Management Follow-up Telephone Call Date of discharge and from where: 12/28/2019 [facility]  How have you been since you were released from the hospital? Feeling ok Any questions or concerns?  Pt agreed to make appt for HFU with PCP but wirhed to cut the call shortly /   Follow up appointments reviewed:  PCP Hospital f/u appt confirmed?  Scheduled to see NP EDWARDS on 01/04/2020 at 2:30pm  Advised pt if if condition is worsening or start experiencing any of diff breathing, SOB, chest pain, extreme fatigue, Persistent nausea and vomiting, bleeding , rapid weight gain, severe uncontrolled pain, or visual disturbances to return to ED

## 2019-12-31 ENCOUNTER — Other Ambulatory Visit: Payer: Self-pay | Admitting: Internal Medicine

## 2019-12-31 DIAGNOSIS — E871 Hypo-osmolality and hyponatremia: Secondary | ICD-10-CM

## 2020-01-03 ENCOUNTER — Inpatient Hospital Stay: Payer: Self-pay | Attending: Internal Medicine | Admitting: Internal Medicine

## 2020-01-03 ENCOUNTER — Inpatient Hospital Stay: Payer: Self-pay

## 2020-01-03 ENCOUNTER — Encounter: Payer: Self-pay | Admitting: Internal Medicine

## 2020-01-03 ENCOUNTER — Other Ambulatory Visit: Payer: Self-pay

## 2020-01-03 ENCOUNTER — Encounter: Payer: Self-pay | Admitting: *Deleted

## 2020-01-03 ENCOUNTER — Telehealth: Payer: Self-pay | Admitting: *Deleted

## 2020-01-03 DIAGNOSIS — I10 Essential (primary) hypertension: Secondary | ICD-10-CM | POA: Insufficient documentation

## 2020-01-03 DIAGNOSIS — Z5111 Encounter for antineoplastic chemotherapy: Secondary | ICD-10-CM

## 2020-01-03 DIAGNOSIS — C3411 Malignant neoplasm of upper lobe, right bronchus or lung: Secondary | ICD-10-CM | POA: Insufficient documentation

## 2020-01-03 DIAGNOSIS — R490 Dysphonia: Secondary | ICD-10-CM | POA: Insufficient documentation

## 2020-01-03 DIAGNOSIS — J449 Chronic obstructive pulmonary disease, unspecified: Secondary | ICD-10-CM | POA: Insufficient documentation

## 2020-01-03 DIAGNOSIS — Z7189 Other specified counseling: Secondary | ICD-10-CM

## 2020-01-03 DIAGNOSIS — C3490 Malignant neoplasm of unspecified part of unspecified bronchus or lung: Secondary | ICD-10-CM

## 2020-01-03 DIAGNOSIS — E871 Hypo-osmolality and hyponatremia: Secondary | ICD-10-CM

## 2020-01-03 DIAGNOSIS — C3491 Malignant neoplasm of unspecified part of right bronchus or lung: Secondary | ICD-10-CM

## 2020-01-03 LAB — CMP (CANCER CENTER ONLY)
ALT: 13 U/L (ref 0–44)
AST: 21 U/L (ref 15–41)
Albumin: 3.5 g/dL (ref 3.5–5.0)
Alkaline Phosphatase: 62 U/L (ref 38–126)
Anion gap: 9 (ref 5–15)
BUN: 11 mg/dL (ref 8–23)
CO2: 27 mmol/L (ref 22–32)
Calcium: 10.6 mg/dL — ABNORMAL HIGH (ref 8.9–10.3)
Chloride: 96 mmol/L — ABNORMAL LOW (ref 98–111)
Creatinine: 0.75 mg/dL (ref 0.61–1.24)
GFR, Est AFR Am: >60 mL/min (ref >60)
GFR, Estimated: >60 mL/min (ref >60)
Glucose, Bld: 81 mg/dL (ref 70–99)
Potassium: 4.3 mmol/L (ref 3.5–5.1)
Sodium: 132 mmol/L — ABNORMAL LOW (ref 135–145)
Total Bilirubin: 0.3 mg/dL (ref 0.3–1.2)
Total Protein: 7.9 g/dL (ref 6.5–8.1)

## 2020-01-03 LAB — CBC WITH DIFFERENTIAL (CANCER CENTER ONLY)
Abs Immature Granulocytes: 0.01 10*3/uL (ref 0.00–0.07)
Basophils Absolute: 0.1 10*3/uL (ref 0.0–0.1)
Basophils Relative: 1 %
Eosinophils Absolute: 0 10*3/uL (ref 0.0–0.5)
Eosinophils Relative: 1 %
HCT: 40.1 % (ref 39.0–52.0)
Hemoglobin: 12.7 g/dL — ABNORMAL LOW (ref 13.0–17.0)
Immature Granulocytes: 0 %
Lymphocytes Relative: 30 %
Lymphs Abs: 1.6 10*3/uL (ref 0.7–4.0)
MCH: 22 pg — ABNORMAL LOW (ref 26.0–34.0)
MCHC: 31.7 g/dL (ref 30.0–36.0)
MCV: 69.6 fL — ABNORMAL LOW (ref 80.0–100.0)
Monocytes Absolute: 0.9 10*3/uL (ref 0.1–1.0)
Monocytes Relative: 17 %
Neutro Abs: 2.7 10*3/uL (ref 1.7–7.7)
Neutrophils Relative %: 51 %
Platelet Count: 425 10*3/uL — ABNORMAL HIGH (ref 150–400)
RBC: 5.76 MIL/uL (ref 4.22–5.81)
RDW: 14.4 % (ref 11.5–15.5)
WBC Count: 5.2 10*3/uL (ref 4.0–10.5)
nRBC: 0 % (ref 0.0–0.2)

## 2020-01-03 MED ORDER — PROCHLORPERAZINE MALEATE 10 MG PO TABS
10.0000 mg | ORAL_TABLET | Freq: Four times a day (QID) | ORAL | 0 refills | Status: DC | PRN
Start: 2020-01-03 — End: 2020-09-21

## 2020-01-03 MED FILL — PROCHLORPERAZINE 10 MG TAB: 10 | 7 days supply | Qty: 30 | Fill #0

## 2020-01-03 NOTE — Progress Notes (Signed)
[place] Telephone:([phone]) [phone]   Fax:([phone]) [phone]  OFFICE PROGRESS NOTE  [name], NP [id number] [place] [id number]  DIAGNOSIS: At least stage IIIA (T3, N2, M0) non-small cell lung cancer, adenocarcinoma diagnosed in August 2021 and presented with large right paratracheal soft tissue mass extending from the right apex to the right suprahilar and precarinal region.  PRIOR THERAPY: None.  CURRENT THERAPY: Concurrent chemoradiation with weekly carboplatin for AUC of 2 and paclitaxel 45 mg/M2.  First dose January 16, 2020.  INTERVAL HISTORY: [name] [name] 64 y.o. male returns to the clinic today for follow-up visit accompanied by his pastor.  The patient is feeling fine today with no concerning complaints except for mild pain on the right side of the chest as well as shortness of breath with exertion.  He denied having any cough or hemoptysis.  He denied having any recent weight loss or night sweats.  He has no nausea, vomiting, diarrhea or constipation.  He has no headache or visual changes.  He was seen for initial evaluation during his hospitalization and was thought at that time to have small cell lung cancer based on the imaging studies but the final pathology was consistent with non-small cell lung cancer, adenocarcinoma.  The patient had CT of the head with and without contrast during his hospitalization that showed no evidence of metastatic disease to the brain.  The patient is here today for evaluation and recommendation regarding treatment of his condition.   MEDICAL HISTORY: Past Medical History:  Diagnosis Date  . 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[place]) 12/25/2019  . COPD (chronic obstructive pulmonary disease) ([place]) 11/02/2007   Qualifier: Diagnosis of  By: Melvyn Novas MD, Christena Deem   . Hypertension 12/24/2019  . Iron deficiency anemia 08/23/2007   Qualifier: Diagnosis of  By: Jim Like      ALLERGIES:  has No Known Allergies.  MEDICATIONS:    Current Outpatient Medications  Medication Sig Dispense Refill  . acetaminophen (TYLENOL) 500 MG tablet Take 500 mg by mouth every 6 (six) hours as needed for mild pain or moderate pain.    Marland Kitchen aspirin 81 MG EC tablet Take 1 tablet (81 mg total) by mouth daily. 30 tablet 11  . budesonide-formoterol (SYMBICORT) 80-4.5 MCG/ACT inhaler Inhale 2 puffs into the lungs 2 (two) times daily. (Patient taking differently: Inhale 2 puffs into the lungs 2 (two) times daily as needed (wheezing, SOB). ) 1 Inhaler 11  . feeding supplement, ENSURE ENLIVE, (ENSURE ENLIVE) LIQD Take 237 mLs by mouth 2 (two) times daily between meals. 237 mL 12  . Ipratropium-Albuterol (COMBIVENT) 20-100 MCG/ACT AERS respimat Inhale 1 puff into the lungs every 6 (six) hours as needed for wheezing or shortness of breath. 4 g 1  . Multiple Vitamin (MULTIVITAMIN) tablet Take 1 tablet by mouth daily.    Marland Kitchen senna-docusate (SENOKOT-S) 8.6-50 MG tablet Take 1 tablet by mouth at bedtime.     No current facility-administered medications for this visit.    SURGICAL HISTORY:  Past Surgical History:  Procedure Laterality Date  . ENDOBRONCHIAL ULTRASOUND N/A 12/27/2019   Procedure: ENDOBRONCHIAL ULTRASOUND;  Surgeon: Laurin Coder, MD;  Location: [facility];  Service: Endoscopy;  Laterality: N/A;  . FINE NEEDLE ASPIRATION  12/27/2019   Procedure: FINE NEEDLE ASPIRATION;  Surgeon: Laurin Coder, MD;  Location: [facility];  Service: Endoscopy;;  . VIDEO BRONCHOSCOPY N/A 12/27/2019   Procedure: VIDEO BRONCHOSCOPY WITHOUT FLUORO;  Surgeon: Laurin Coder, MD;  Location:  [facility];  Service: Endoscopy;  Laterality: N/A;    REVIEW OF SYSTEMS:  Constitutional: positive for fatigue Eyes: negative Ears, nose, mouth, throat, and face: negative Respiratory: positive for dyspnea on exertion and pleurisy/chest pain Cardiovascular: negative Gastrointestinal: negative Genitourinary:negative Integument/breast:  negative Hematologic/lymphatic: negative Musculoskeletal:negative Neurological: negative Behavioral/Psych: negative Endocrine: negative Allergic/Immunologic: negative   PHYSICAL EXAMINATION: [name] appearance: alert, cooperative, fatigued and no distress Head: Normocephalic, without obvious abnormality, atraumatic Neck: no adenopathy, no JVD, supple, symmetrical, trachea midline and thyroid not enlarged, symmetric, no tenderness/mass/nodules Lymph nodes: Cervical, supraclavicular, and axillary nodes normal. Resp: clear to auscultation bilaterally Back: symmetric, no curvature. ROM normal. No CVA tenderness. Cardio: regular rate and rhythm, S1, S2 normal, no murmur, click, rub or gallop GI: soft, non-tender; bowel sounds normal; no masses,  no organomegaly Extremities: extremities normal, atraumatic, no cyanosis or edema Neurologic: Alert and oriented X 3, normal strength and tone. Normal symmetric reflexes. Normal coordination and gait  ECOG PERFORMANCE STATUS: 1 - Symptomatic but completely ambulatory  Blood pressure 136/76, pulse 89, temperature 98.4 F (36.9 C), temperature source Axillary, resp. rate 18, height 5\' 1"  (1.549 m), weight 113 lb 12.8 oz (51.6 kg), SpO2 98 %.  LABORATORY DATA: Lab Results  Component Value Date   WBC 5.2 01/03/2020   HGB 12.7 (L) 01/03/2020   HCT 40.1 01/03/2020   MCV 69.6 (L) 01/03/2020   PLT 425 (H) 01/03/2020      Chemistry      Component Value Date/Time   NA 135 12/28/2019 0456   NA 144 08/03/2019 1527   K 4.3 12/28/2019 0456   CL 102 12/28/2019 0456   CO2 24 12/28/2019 0456   BUN 8 12/28/2019 0456   BUN 9 08/03/2019 1527   CREATININE 0.62 12/28/2019 0456      Component Value Date/Time   CALCIUM 9.2 12/28/2019 0456   ALKPHOS 56 12/25/2019 0515   AST 17 12/25/2019 0515   ALT 10 12/25/2019 0515   BILITOT 0.6 12/25/2019 0515   BILITOT <0.2 08/03/2019 1527       RADIOGRAPHIC STUDIES: DG Chest 2 View  Result Date:  12/24/2019 CLINICAL DATA:  Upper back pain. EXAM: CHEST - 2 VIEW COMPARISON:  10/22/2017 FINDINGS: Heart is normal size. Large soft tissue mass in the right paratracheal region and medial left lower lobe. Left lung clear. No effusions or acute bony abnormality. IMPRESSION: Large right paratracheal soft tissue mass. Recommend further evaluation with chest CT with IV contrast. Electronically Signed   By: Rolm Baptise M.D.   On: 12/24/2019 17:36   DG Cervical Spine Complete  Result Date: 12/24/2019 CLINICAL DATA:  Mid upper back pain.  No known injury. EXAM: CERVICAL SPINE - COMPLETE 4+ VIEW COMPARISON:  None. FINDINGS: Degenerative disc disease changes with disc space narrowing and anterior spurring. Bilateral degenerative facet disease. Severe bilateral neural foraminal narrowing at C4-5. No fracture or malalignment. Prevertebral soft tissues are normal. IMPRESSION: Degenerative disc and facet disease as above. No acute bony abnormality. Electronically Signed   By: Rolm Baptise M.D.   On: 12/24/2019 17:35   DG Thoracic Spine 2 View  Result Date: 12/24/2019 CLINICAL DATA:  Chest pain, upper back pain EXAM: THORACIC SPINE 2 VIEWS COMPARISON:  10/22/2017 FINDINGS: Soft tissue mass noted in the right paratracheal region. This is new since prior study. No acute bony abnormality. No fracture or malalignment. No focal bone lesion. IMPRESSION: No acute bony abnormality. Large right paratracheal soft tissue mass. Recommend chest CT with IV contrast for further evaluation.  Electronically Signed   By: Rolm Baptise M.D.   On: 12/24/2019 17:35   CT HEAD W & WO CONTRAST  Result Date: 12/28/2019 CLINICAL DATA:  Non-small-cell lung cancer.  Pretreatment staging. EXAM: CT HEAD WITHOUT AND WITH CONTRAST TECHNIQUE: Contiguous axial images were obtained from the base of the skull through the vertex without and with intravenous contrast CONTRAST:  28mL OMNIPAQUE IOHEXOL 300 MG/ML  SOLN COMPARISON:  None. 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 There is mild encephalomalacia in the right temporal lobe. Ventricle size normal. 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 Negative for acute infarct, hemorrhage, mass No enhancing metastatic lesions identified. Vascular: Negative for hyperdense vessel. Normal arterial and venous enhancement. Skull: Right pterional craniotomy for aneurysm clipping. Sinuses/Orbits: Paranasal sinuses clear.  Negative orbit Other: None IMPRESSION: 1. Negative for metastatic disease to the brain 2. Craniotomy for aneurysm clipping. Electronically Signed   By: Franchot Gallo M.D.   On: 12/28/2019 13:58   CT CHEST ABDOMEN PELVIS W CONTRAST  Result Date: 12/24/2019 CLINICAL DATA:  Right paratracheal soft tissue mass found on chest x-ray. EXAM: CT CHEST, ABDOMEN, AND PELVIS WITH CONTRAST TECHNIQUE: Multidetector CT imaging of the chest, abdomen and pelvis was performed following the standard protocol during bolus administration of intravenous contrast. CONTRAST:  160mL OMNIPAQUE IOHEXOL 300 MG/ML  SOLN COMPARISON:  May 30, 2008 FINDINGS: CT CHEST FINDINGS Cardiovascular: No significant vascular findings. Normal heart size. No pericardial effusion. Mediastinum/Nodes: A 6.7 cm x 6.8 cm x 8.6 cm lobulated heterogeneous low-attenuation right paratracheal soft tissue mass is seen. This extends from the right apex to the right suprahilar and precarinal regions. Thyroid gland, trachea, and esophagus demonstrate no significant findings. Lungs/Pleura: The lungs are hyperinflated. Moderate severity bullous changes are seen along the anterior aspect of the mid and upper left lung. Adjacent 6 mm and 4 mm noncalcified lung nodule is seen within the anterior aspect of the left upper lobe. There is no evidence of acute infiltrate, pleural effusion or pneumothorax. Musculoskeletal: No acute osseous abnormalities are identified. CT ABDOMEN  PELVIS FINDINGS Hepatobiliary: A 7 mm cystic appearing area is seen within the anterior aspect of the left lobe of the liver. No gallstones, gallbladder wall thickening, or biliary dilatation. Pancreas: Unremarkable. No pancreatic ductal dilatation or surrounding inflammatory changes. Spleen: Normal in size without focal abnormality. Adrenals/Urinary Tract: Adrenal glands are unremarkable. Kidneys are normal, without renal calculi, focal lesion, or hydronephrosis. Bladder is unremarkable. Stomach/Bowel: Stomach is within normal limits. Appendix appears normal. No evidence of bowel wall thickening, distention, or inflammatory changes. Vascular/Lymphatic: There is moderate severity calcification of the abdominal aorta and bilateral common iliac arteries, without evidence of aneurysmal dilatation. No enlarged abdominal or pelvic lymph nodes. Reproductive: There is moderate to marked severity prostate gland enlargement. Other: No abdominal wall hernia or abnormality. No abdominopelvic ascites. Musculoskeletal: Grade 1 anterolisthesis of the L5 vertebral body on S1 is seen. IMPRESSION: 1. 6.7 cm x 6.8 cm x 8.6 cm lobulated heterogeneous low-attenuation right paratracheal soft tissue mass. This extends from the right apex to the right suprahilar and precarinal regions and is consistent with a neoplastic process. 2. Adjacent 6 mm and 4 mm noncalcified lung nodule within the anterior aspect of the left upper lobe. Correlation with follow-up chest CT at 12 months is recommended. 3. Moderate severity bullous changes along the anterior aspect of the mid and upper left lung. 4. Moderate to marked severity prostate gland  enlargement. 5. Grade 1 anterolisthesis of the L5 vertebral body on S1. 6. Small cyst suspected within the left lobe of the liver. Aortic Atherosclerosis (ICD10-I70.0). Electronically Signed   By: Virgina Norfolk M.D.   On: 12/24/2019 19:39    ASSESSMENT AND PLAN: This is a very pleasant 64 years old  African-American male recently diagnosed with at least stage IIIb (T3, N2, M0) non-small cell lung cancer, adenocarcinoma presented with large right paratracheal soft tissue mass extending from the right apex to the right suprahilar and precarinal region diagnosed in August 2021. I had a lengthy discussion with the patient and his pastor today about his current condition and treatment options. I recommended for the patient to complete the staging work-up by ordering a PET scan to rule out any other metastatic disease. I discussed with the patient his treatment options if no other concerning metastatic lesions.  I recommended for him a course of concurrent chemoradiation with weekly carboplatin for AUC of 2 and paclitaxel 45 mg/M2 for 6-7 weeks this will be followed by consolidation treatment with immunotherapy with Imfinzi if the patient has no disease progression after the induction phase. I discussed with the patient the adverse effect of this treatment including but not limited to alopecia, myelosuppression, nausea and vomiting, peripheral neuropathy, liver or renal dysfunction. The patient is expected to start the first cycle of this treatment on January 16, 2020. I will refer the patient to radiation oncology for evaluation and discussion of the radiotherapy option. I will arrange for the patient to have a chemotherapy education class before the first dose of his treatment. I will call his pharmacy with prescription for Compazine 10 mg p.o. every 6 hours as needed for nausea. The patient will come back for follow-up visit with the start of the first dose of his chemotherapy. He was advised to call immediately if he has any concerning symptoms in the interval. The patient voices understanding of current disease status and treatment options and is in agreement with the current care plan.  All questions were answered. The patient knows to call the clinic with any problems, questions or concerns. We can  certainly see the patient much sooner if necessary.  The total time spent in the appointment was 30 minutes.  Disclaimer: This note was dictated with voice recognition software. Similar sounding words can inadvertently be transcribed and may not be corrected upon review.

## 2020-01-03 NOTE — Telephone Encounter (Signed)
[facility] and PDL 1 requested per Dr. Julien Nordmann

## 2020-01-03 NOTE — Progress Notes (Signed)
I met [name] [name] today at clinic. He has been dx with stage III NSCLC.  Per Dr. Julien Nordmann I requested his recent bx to be sent for molecular testing.  I gave and explained information on lung cancer and his treatment plan.

## 2020-01-03 NOTE — Progress Notes (Signed)

## 2020-01-04 ENCOUNTER — Inpatient Hospital Stay (INDEPENDENT_AMBULATORY_CARE_PROVIDER_SITE_OTHER): Payer: Self-pay | Admitting: Primary Care

## 2020-01-05 ENCOUNTER — Telehealth: Payer: Self-pay | Admitting: Internal Medicine

## 2020-01-05 NOTE — Telephone Encounter (Signed)
Scheduled per los. Called and spoke with patient. Will get printout of scheduled at patient edu tomorrow.

## 2020-01-06 ENCOUNTER — Other Ambulatory Visit: Payer: Self-pay

## 2020-01-06 ENCOUNTER — Inpatient Hospital Stay: Payer: Self-pay

## 2020-01-09 ENCOUNTER — Encounter: Payer: Self-pay | Admitting: *Deleted

## 2020-01-09 NOTE — Progress Notes (Signed)
[employer] representative received a message from [employer] requesting transportation assitance for patient.  CSW returned call to offer support and assess for needs.  Ms. Elgie Congo and the patients church family have been providing transportion to patients appoitments, but will need assistance as patient begins treatment.  CSW provided brief education on transportation resources and made a referral to the [facility] transportation program.  Vickii Chafe requested the church family be a point of contact to help explain transportation process to patient.  Patient is currently uninsured, and Peggy requested assistance applying for mediciad.  According to patients chart, patient was referred by Altamese Cabal on 8/19 to [employer] to assist with Medicaid application.  CSW encouraged patient and/or support community to contact CSW with questions or concerns.   Johnnye Lana, MSW, LCSW, OSW-C Clinical Social Worker [facility] [phone]

## 2020-01-10 NOTE — Progress Notes (Signed)
Thoracic Location of Tumor / Histology: Non-small cell lung cancer- Adenocarcinoma  Patient presented to the ER with complaints of right shoulder and thoracic back pain that has been ongoing for several weeks with cough and shortness of breath.  PET 01/11/2020: Marked hypermetabolic mediastinal nodal metastasis. No pulmonary parenchymal primary identified. Low-level hypermetabolism within a portion of an area of presumed post infectious/inflammatory left upper lobe scarring is favored to be benign. Equivocal right hilar node, suspicious based on location.  No hypermetabolic extrathoracic metastatic disease identified.  CT Head 12/28/2019: Negative for metastatic disease to the brain.  Craniotomy for aneurysm clipping  Bronchoscopy 12/27/2019:   CT CAP 12/24/2019: 6.7 x 6.8 x 8.6 cm lobulated heterogeneous low-attenuation right paratracheal soft tissue mass.  This extends from the right apex to the right suprahilar and precarinal regions.  There was also adjacent 0.6 and 0.4 centimeters noncalcified lung nodule seen within the anterior aspect of the left upper lobe.  Moderate to marked severity prostate gland enlargement but no evidence of metastatic disease in the abdomen or pelvis.   T Spine xray 12/24/2019: Large right paratracheal soft tissue mass.  Biopsies of Paratracheal Mass 12/27/2019   Tobacco/Marijuana/Snuff/ETOH use: Current Smoker  Past/Anticipated interventions by cardiothoracic surgery, if any:   Past/Anticipated interventions by medical oncology, if any:  Dr. Julien Nordmann 01/03/2020 -I recommended for the patient to complete the staging work-up by ordering a PET scan to rule out any other metastatic disease. -I discussed with the patient his treatment options if no other concerning metastatic lesions.  - I recommended for him a course of concurrent chemoradiation with weekly carboplatin for AUC of 2 and paclitaxel 45 mg/M2 for 6-7 weeks this will be followed by consolidation treatment  with immunotherapy with Imfinzi if the patient has no disease progression after the induction phase. -The patient is expected to start the first cycle of this treatment on January 16, 2020. -I will refer the patient to radiation oncology for evaluation and discussion of the radiotherapy option.   Signs/Symptoms  Weight changes, if any: Has lost some weight, unsure how many pounds.  Respiratory complaints, if any: No  Hemoptysis, if any: Non-productive cough on occasion.  Pain issues, if any:  No  SAFETY ISSUES:  Prior radiation? No  Pacemaker/ICD? No  Possible current pregnancy? n/a  Is the patient on methotrexate? No  Current Complaints / other details:

## 2020-01-11 ENCOUNTER — Other Ambulatory Visit: Payer: Self-pay

## 2020-01-11 ENCOUNTER — Encounter (HOSPITAL_COMMUNITY)
Admission: RE | Admit: 2020-01-11 | Discharge: 2020-01-11 | Disposition: A | Discharge status: Home / Self Care | Payer: Self-pay | Source: Ambulatory Visit | Attending: Internal Medicine | Admitting: Internal Medicine

## 2020-01-11 DIAGNOSIS — C3490 Malignant neoplasm of unspecified part of unspecified bronchus or lung: Secondary | ICD-10-CM | POA: Insufficient documentation

## 2020-01-11 LAB — GLUCOSE, CAPILLARY: Glucose-Capillary: 78 mg/dL (ref 70–99)

## 2020-01-11 MED ORDER — FLUDEOXYGLUCOSE F - 18 (FDG) INJECTION
6.7000 | Freq: Once | INTRAVENOUS | Status: AC
  Administered 2020-01-11: 6.7 via INTRAVENOUS

## 2020-01-12 ENCOUNTER — Encounter: Payer: Self-pay | Admitting: Radiation Oncology

## 2020-01-12 ENCOUNTER — Other Ambulatory Visit: Payer: Self-pay

## 2020-01-12 ENCOUNTER — Ambulatory Visit
Admission: RE | Admit: 2020-01-12 | Discharge: 2020-01-12 | Disposition: A | Discharge status: Home / Self Care | Payer: Self-pay | Source: Ambulatory Visit | Attending: Radiation Oncology | Admitting: Radiation Oncology

## 2020-01-12 ENCOUNTER — Ambulatory Visit
Admission: RE | Admit: 2020-01-12 | Discharge: 2020-01-12 | Disposition: A | Discharge status: Home / Self Care | Payer: Self-pay | Source: Ambulatory Visit | Attending: Internal Medicine | Admitting: Internal Medicine

## 2020-01-12 ENCOUNTER — Encounter: Payer: Self-pay | Admitting: *Deleted

## 2020-01-12 ENCOUNTER — Encounter: Payer: Self-pay | Admitting: General Practice

## 2020-01-12 VITALS — BP 121/70 | HR 88 | Temp 98.2°F | Resp 18 | Ht 61.0 in | Wt 114.2 lb

## 2020-01-12 DIAGNOSIS — I700 Atherosclerosis of aorta: Secondary | ICD-10-CM | POA: Insufficient documentation

## 2020-01-12 DIAGNOSIS — F17210 Nicotine dependence, cigarettes, uncomplicated: Secondary | ICD-10-CM | POA: Insufficient documentation

## 2020-01-12 DIAGNOSIS — K769 Liver disease, unspecified: Secondary | ICD-10-CM | POA: Insufficient documentation

## 2020-01-12 DIAGNOSIS — Z79899 Other long term (current) drug therapy: Secondary | ICD-10-CM | POA: Insufficient documentation

## 2020-01-12 DIAGNOSIS — C3411 Malignant neoplasm of upper lobe, right bronchus or lung: Secondary | ICD-10-CM | POA: Insufficient documentation

## 2020-01-12 DIAGNOSIS — C771 Secondary and unspecified malignant neoplasm of intrathoracic lymph nodes: Secondary | ICD-10-CM | POA: Insufficient documentation

## 2020-01-12 DIAGNOSIS — Z510 Encounter for antineoplastic radiation therapy: Secondary | ICD-10-CM | POA: Insufficient documentation

## 2020-01-12 DIAGNOSIS — M4317 Spondylolisthesis, lumbosacral region: Secondary | ICD-10-CM | POA: Insufficient documentation

## 2020-01-12 DIAGNOSIS — J439 Emphysema, unspecified: Secondary | ICD-10-CM | POA: Insufficient documentation

## 2020-01-12 DIAGNOSIS — C3491 Malignant neoplasm of unspecified part of right bronchus or lung: Secondary | ICD-10-CM

## 2020-01-12 DIAGNOSIS — I10 Essential (primary) hypertension: Secondary | ICD-10-CM | POA: Insufficient documentation

## 2020-01-12 DIAGNOSIS — Z7982 Long term (current) use of aspirin: Secondary | ICD-10-CM | POA: Insufficient documentation

## 2020-01-12 DIAGNOSIS — D509 Iron deficiency anemia, unspecified: Secondary | ICD-10-CM | POA: Insufficient documentation

## 2020-01-12 NOTE — Progress Notes (Signed)
I followed up with [facility] regarding [name] [name]'s pending status for test.  I was told that there is not enough tissue to run the test. I notified Dr. Julien Nordmann.

## 2020-01-12 NOTE — Progress Notes (Signed)
Per Dr. Julien Nordmann, I ordered Guardant 360 test at next lab draw

## 2020-01-12 NOTE — Progress Notes (Signed)
Radiation Oncology         [phone] ________________________________  Name: Beatrice Sehgal        MRN: [id number]  Date of Service: 01/12/2020 DOB: Sep 23, 1955  PF:[id number], Milford Cage, NP  Curt Bears, MD     REFERRING PHYSICIAN: Curt Bears, MD   DIAGNOSIS: The encounter diagnosis was Adenocarcinoma of right lung, stage 3 ([place]).   HISTORY OF PRESENT ILLNESS: Olin Gurski is a 64 y.o. male seen at the request of Dr. Julien Nordmann for newly diagnosed lung cancer.  The patient presented to the emergency room complaining of right shoulder and thoracic back pain for several weeks as well as cough and shortness of breath.  Imaging on 12/24/2019 revealed a 6.7 x 6.8 x 8.6 cm lobulated low-attenuation right paratracheal soft tissue mass extending from the right apex to the right suprahilar and precarinal regions, with moderate severity bullous changes in the mid and upper left lung, an adjacent 6 mm and 4 mm noncalcified lung nodule is seen within the anterior aspect of the left upper lobe.  Marked enlargement of the prostate was seen, and a small cyst was noted in the left lobe of the liver.  He had a CT head with and without contrast on 12/28/2019 which was negative for metastatic disease but showed craniotomy for aneurysmal clipping.  PET scan yesterday revealed marked hypermetabolic mediastinal and nodal metastases with no primary parenchymal lesion identified but low-level hypermetabolic change within the postinfectious/inflammatory left upper lobe, equivocal right hilar node that was suspicious was also identified.  His bronchoscopy on 12/27/2019 was of a paratracheal mass with FNA and subcarinal FNA.  The paratracheal mass showed malignancy with adenocarcinoma consistent with a lung primary, the subcarinal lymph node sampled was negative for metastatic disease.  Given these findings he is seen today to discuss options of chemoradiation.    PREVIOUS RADIATION THERAPY: No   PAST MEDICAL  HISTORY:  Past Medical History:  Diagnosis Date  . 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[place]) 12/25/2019  . COPD (chronic obstructive pulmonary disease) ([place]) 11/02/2007   Qualifier: Diagnosis of  By: Melvyn Novas MD, Christena Deem   . Hypertension 12/24/2019  . Iron deficiency anemia 08/23/2007   Qualifier: Diagnosis of  By: Jim Like         PAST SURGICAL HISTORY: Past Surgical History:  Procedure Laterality Date  . ENDOBRONCHIAL ULTRASOUND N/A 12/27/2019   Procedure: ENDOBRONCHIAL ULTRASOUND;  Surgeon: Laurin Coder, MD;  Location: [facility];  Service: Endoscopy;  Laterality: N/A;  . FINE NEEDLE ASPIRATION  12/27/2019   Procedure: FINE NEEDLE ASPIRATION;  Surgeon: Laurin Coder, MD;  Location: [facility];  Service: Endoscopy;;  . VIDEO BRONCHOSCOPY N/A 12/27/2019   Procedure: VIDEO BRONCHOSCOPY WITHOUT FLUORO;  Surgeon: Laurin Coder, MD;  Location: [facility];  Service: Endoscopy;  Laterality: N/A;     FAMILY HISTORY:  Family History  Problem Relation Age of Onset  . Hypertension Other      SOCIAL HISTORY:  reports that he has been smoking cigarettes. He has been smoking about 0.50 packs per day. He has never used smokeless tobacco. He reports previous alcohol use. He reports that he does not use drugs. The patient is single and lives alone. He walks to his local grocery store. He is accompanied by a church friend.   ALLERGIES: Patient has no known allergies.   MEDICATIONS:  Current Outpatient Medications  Medication Sig Dispense Refill  . budesonide-formoterol (SYMBICORT) 80-4.5 MCG/ACT inhaler Inhale 2 puffs into the lungs 2 (two) times daily. (  Patient taking differently: Inhale 2 puffs into the lungs 2 (two) times daily as needed (wheezing, SOB). ) 1 Inhaler 11  . acetaminophen (TYLENOL) 500 MG tablet Take 500 mg by mouth every 6 (six) hours as needed for mild pain or moderate pain.    Marland Kitchen aspirin 81 MG EC tablet Take 1 tablet (81 mg total) by mouth daily. 30 tablet 11  .  feeding supplement, ENSURE ENLIVE, (ENSURE ENLIVE) LIQD Take 237 mLs by mouth 2 (two) times daily between meals. 237 mL 12  . Ipratropium-Albuterol (COMBIVENT) 20-100 MCG/ACT AERS respimat Inhale 1 puff into the lungs every 6 (six) hours as needed for wheezing or shortness of breath. (Patient not taking: Reported on 01/12/2020) 4 g 1  . Multiple Vitamin (MULTIVITAMIN) tablet Take 1 tablet by mouth daily.    . prochlorperazine (COMPAZINE) 10 MG tablet Take 1 tablet (10 mg total) by mouth every 6 (six) hours as needed for nausea or vomiting. 30 tablet 0  . senna-docusate (SENOKOT-S) 8.6-50 MG tablet Take 1 tablet by mouth at bedtime.    Marland Kitchen SPIRIVA HANDIHALER 18 MCG inhalation capsule 1 capsule daily.    . VENTOLIN HFA 108 (90 Base) MCG/ACT inhaler Inhale 2 puffs into the lungs every 4 (four) hours as needed.     No current facility-administered medications for this encounter.     REVIEW OF SYSTEMS: On review of systems, the patient reports that he is doing well overall. He denies any chest pain. He does have shortness of breath with exertion as well as hoarseness since June 2020. He does have a productive cough with clear sputum but denies any hemoptysis. He denies fevers, chills, night sweats, unintended weight changes. He denies any bowel or bladder disturbances, and denies abdominal pain, nausea or vomiting. He does have some right shoulder pain that has been present also since about June, and and has has had low back pain recently that is felt to be arthritic. A complete review of systems is obtained and is otherwise negative.     PHYSICAL EXAM:  Wt Readings from Last 3 Encounters:  01/12/20 114 lb 3.2 oz (51.8 kg)  01/11/20 120 lb (54.4 kg)  01/03/20 113 lb 12.8 oz (51.6 kg)   Temp Readings from Last 3 Encounters:  01/12/20 98.2 F (36.8 C)  01/03/20 98.4 F (36.9 C) (Axillary)  12/28/19 98.9 F (37.2 C) (Oral)   BP Readings from Last 3 Encounters:  01/12/20 121/70  01/03/20 136/76    12/28/19 138/78   Pulse Readings from Last 3 Encounters:  01/12/20 88  01/03/20 89  12/28/19 89   Pain Assessment Pain Score: 0-No pain/10  In general this is a well appearing African-American male in no acute distress.  He's alert and oriented x4 and appropriate throughout the examination. Cardiopulmonary assessment is negative for acute distress and he exhibits normal effort.     ECOG = 1  0 - Asymptomatic (Fully active, able to carry on all predisease activities without restriction)  1 - Symptomatic but completely ambulatory (Restricted in physically strenuous activity but ambulatory and able to carry out work of a light or sedentary nature. For example, light housework, office work)  2 - Symptomatic, <50% in bed during the day (Ambulatory and capable of all self care but unable to carry out any work activities. Up and about more than 50% of waking hours)  3 - Symptomatic, >50% in bed, but not bedbound (Capable of only limited self-care, confined to bed or chair 50% or more  of waking hours)  4 - Bedbound (Completely disabled. Cannot carry on any self-care. Totally confined to bed or chair)  5 - Death   Eustace Pen MM, Creech RH, Tormey DC, et al. [phone]). "Toxicity and response criteria of the [facility] Group". [place] Oncol. 5 (6): 649-55    LABORATORY DATA:  Lab Results  Component Value Date   WBC 5.2 01/03/2020   HGB 12.7 (L) 01/03/2020   HCT 40.1 01/03/2020   MCV 69.6 (L) 01/03/2020   PLT 425 (H) 01/03/2020   Lab Results  Component Value Date   NA 132 (L) 01/03/2020   K 4.3 01/03/2020   CL 96 (L) 01/03/2020   CO2 27 01/03/2020   Lab Results  Component Value Date   ALT 13 01/03/2020   AST 21 01/03/2020   ALKPHOS 62 01/03/2020   BILITOT 0.3 01/03/2020      RADIOGRAPHY: DG Chest 2 View  Result Date: 12/24/2019 CLINICAL DATA:  Upper back pain. EXAM: CHEST - 2 VIEW COMPARISON:  10/22/2017 FINDINGS: Heart is normal size. Large soft tissue  mass in the right paratracheal region and medial left lower lobe. Left lung clear. No effusions or acute bony abnormality. IMPRESSION: Large right paratracheal soft tissue mass. Recommend further evaluation with chest CT with IV contrast. Electronically Signed   By: Rolm Baptise M.D.   On: 12/24/2019 17:36   DG Cervical Spine Complete  Result Date: 12/24/2019 CLINICAL DATA:  Mid upper back pain.  No known injury. EXAM: CERVICAL SPINE - COMPLETE 4+ VIEW COMPARISON:  None. FINDINGS: Degenerative disc disease changes with disc space narrowing and anterior spurring. Bilateral degenerative facet disease. Severe bilateral neural foraminal narrowing at C4-5. No fracture or malalignment. Prevertebral soft tissues are normal. IMPRESSION: Degenerative disc and facet disease as above. No acute bony abnormality. Electronically Signed   By: Rolm Baptise M.D.   On: 12/24/2019 17:35   DG Thoracic Spine 2 View  Result Date: 12/24/2019 CLINICAL DATA:  Chest pain, upper back pain EXAM: THORACIC SPINE 2 VIEWS COMPARISON:  10/22/2017 FINDINGS: Soft tissue mass noted in the right paratracheal region. This is new since prior study. No acute bony abnormality. No fracture or malalignment. No focal bone lesion. IMPRESSION: No acute bony abnormality. Large right paratracheal soft tissue mass. Recommend chest CT with IV contrast for further evaluation. Electronically Signed   By: Rolm Baptise M.D.   On: 12/24/2019 17:35   CT HEAD W & WO CONTRAST  Result Date: 12/28/2019 CLINICAL DATA:  Non-small-cell lung cancer.  Pretreatment staging. EXAM: CT HEAD WITHOUT AND WITH CONTRAST TECHNIQUE: Contiguous axial images were obtained from the base of the skull through the vertex without and with intravenous contrast CONTRAST:  65mL OMNIPAQUE IOHEXOL 300 MG/ML  SOLN COMPARISON:  None. 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 There is mild encephalomalacia in the  right temporal lobe. Ventricle size normal. 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 Negative for acute infarct, hemorrhage, mass No enhancing metastatic lesions identified. Vascular: Negative for hyperdense vessel. Normal arterial and venous enhancement. Skull: Right pterional craniotomy for aneurysm clipping. Sinuses/Orbits: Paranasal sinuses clear.  Negative orbit Other: None IMPRESSION: 1. Negative for metastatic disease to the brain 2. Craniotomy for aneurysm clipping. Electronically Signed   By: Franchot Gallo M.D.   On: 12/28/2019 13:58   CT CHEST ABDOMEN PELVIS W CONTRAST  Result Date: 12/24/2019 CLINICAL DATA:  Right paratracheal soft tissue mass found on  chest x-ray. EXAM: CT CHEST, ABDOMEN, AND PELVIS WITH CONTRAST TECHNIQUE: Multidetector CT imaging of the chest, abdomen and pelvis was performed following the standard protocol during bolus administration of intravenous contrast. CONTRAST:  157mL OMNIPAQUE IOHEXOL 300 MG/ML  SOLN COMPARISON:  May 30, 2008 FINDINGS: CT CHEST FINDINGS Cardiovascular: No significant vascular findings. Normal heart size. No pericardial effusion. Mediastinum/Nodes: A 6.7 cm x 6.8 cm x 8.6 cm lobulated heterogeneous low-attenuation right paratracheal soft tissue mass is seen. This extends from the right apex to the right suprahilar and precarinal regions. Thyroid gland, trachea, and esophagus demonstrate no significant findings. Lungs/Pleura: The lungs are hyperinflated. Moderate severity bullous changes are seen along the anterior aspect of the mid and upper left lung. Adjacent 6 mm and 4 mm noncalcified lung nodule is seen within the anterior aspect of the left upper lobe. There is no evidence of acute infiltrate, pleural effusion or pneumothorax. Musculoskeletal: No acute osseous abnormalities are identified. CT ABDOMEN PELVIS FINDINGS Hepatobiliary: A 7 mm cystic appearing area is seen within the anterior aspect of the left lobe of the liver. No gallstones,  gallbladder wall thickening, or biliary dilatation. Pancreas: Unremarkable. No pancreatic ductal dilatation or surrounding inflammatory changes. Spleen: Normal in size without focal abnormality. Adrenals/Urinary Tract: Adrenal glands are unremarkable. Kidneys are normal, without renal calculi, focal lesion, or hydronephrosis. Bladder is unremarkable. Stomach/Bowel: Stomach is within normal limits. Appendix appears normal. No evidence of bowel wall thickening, distention, or inflammatory changes. Vascular/Lymphatic: There is moderate severity calcification of the abdominal aorta and bilateral common iliac arteries, without evidence of aneurysmal dilatation. No enlarged abdominal or pelvic lymph nodes. Reproductive: There is moderate to marked severity prostate gland enlargement. Other: No abdominal wall hernia or abnormality. No abdominopelvic ascites. Musculoskeletal: Grade 1 anterolisthesis of the L5 vertebral body on S1 is seen. IMPRESSION: 1. 6.7 cm x 6.8 cm x 8.6 cm lobulated heterogeneous low-attenuation right paratracheal soft tissue mass. This extends from the right apex to the right suprahilar and precarinal regions and is consistent with a neoplastic process. 2. Adjacent 6 mm and 4 mm noncalcified lung nodule within the anterior aspect of the left upper lobe. Correlation with follow-up chest CT at 12 months is recommended. 3. Moderate severity bullous changes along the anterior aspect of the mid and upper left lung. 4. Moderate to marked severity prostate gland enlargement. 5. Grade 1 anterolisthesis of the L5 vertebral body on S1. 6. Small cyst suspected within the left lobe of the liver. Aortic Atherosclerosis (ICD10-I70.0). Electronically Signed   By: Virgina Norfolk M.D.   On: 12/24/2019 19:39   NM PET Image Initial (PI) Skull Base To Thigh  Result Date: 01/11/2020 CLINICAL DATA:  Initial treatment strategy for new diagnosis of non-small cell lung cancer. Biopsy on 12/27/2019. No COVID vaccine.  EXAM: NUCLEAR MEDICINE PET SKULL BASE TO THIGH TECHNIQUE: 6.7 mCi F-18 FDG was injected intravenously. Full-ring PET imaging was performed from the skull base to thigh after the radiotracer. CT data was obtained and used for attenuation correction and anatomic localization. Fasting blood glucose: 78 mg/dl COMPARISON:  Chest abdomen and pelvic CTs of 12/24/2019. Chest radiograph 12/24/2019. FINDINGS: Mediastinal blood pool activity: SUV max 2.0 Liver activity: SUV max NA NECK: Low cervical activity is felt to be a combination of muscular and possibly minimal hypermetabolic "brown" fat. No convincing evidence of cervical nodal hypermetabolism. Incidental CT findings: No cervical adenopathy. Right frontal craniotomy for aneurysm clipping. CHEST: Dominant right paratracheal nodal mass measures 6.8 by 7.0 cm and a S.U.V.  max of 18.1 on 53/4. Central necrosis identified within. Adjacent, likely separate low paratracheal node of 2.1 cm and a S.U.V. max of 11.063/4. Right hilar hypermetabolism corresponding to borderline adenopathy on prior diagnostic CT. Example at a S.U.V. max of 4.8 on 72/4. A subcarinal node measures 12 mm and a S.U.V. max of 3.3 on 69/4. Low-level hypermetabolism corresponding to a portion of the left upper lobe volume loss and cystic bronchiectasis. Example at a S.U.V. max of 1.7 on 19/8. The adjacent lingular nodules described on CT are without hypermetabolic correlate on 19/4. Incidental CT findings: Emphysema.  Mild mediastinal shift left. ABDOMEN/PELVIS: No abdominopelvic parenchymal or nodal hypermetabolism. Hypermetabolism about the distal penis is presumably due to urinary contamination. Incidental CT findings: Normal adrenal glands. Abdominal aortic atherosclerosis. Mild prostatomegaly. SKELETON: No abnormal marrow activity. Incidental CT findings: Probable bone island in the right iliac. Bilateral L5 pars defects with grade 1 L5-S1 anterolisthesis. IMPRESSION: 1. Marked hypermetabolic  mediastinal nodal metastasis. No pulmonary parenchymal primary identified. Low-level hypermetabolism within a portion of an area of presumed post infectious/inflammatory left upper lobe scarring is favored to be benign. 2. Equivocal right hilar node, suspicious based on location. 3. No hypermetabolic extrathoracic metastatic disease identified. 4. Incidental findings, including: Coronary artery atherosclerosis. Aortic Atherosclerosis (ICD10-I70.0). Electronically Signed   By: Abigail Miyamoto M.D.   On: 01/11/2020 17:04       IMPRESSION/PLAN: 1. Stage IIIB, cT3N2M0, NSCLC, adenocarcinoma of the RUL. Dr. Lisbeth Renshaw discusses the pathology findings and reviews the nature of locally advanced lung cancer.  He reviews the rationale for chemoradiation. Dr. Lisbeth Renshaw is satisfied with the patient's CT brain rather than MRI since it was performed with contrast. We discussed the risks, benefits, short, and long term effects of radiotherapy, and the patient is interested in proceeding. Dr. Lisbeth Renshaw discusses the delivery and logistics of radiotherapy and anticipates a course of 6-1/2 weeks of radiotherapy. Written consent is obtained and placed in the chart, a copy was provided to the patient.  He will simulate this morning after our consultation and we anticipate starting treatment on 01/17/20.  In a visit lasting 60 minutes, greater than 50% of the time was spent face to face discussing the patient's condition, in preparation for the discussion, and coordinating the patient's care.   The above documentation reflects my direct findings during this shared patient visit. Please see the separate note by Dr. Lisbeth Renshaw on this date for the remainder of the patient's plan of care.    Carola Rhine, PAC

## 2020-01-12 NOTE — Progress Notes (Signed)
[place] Initial Psychosocial Assessment Clinical Social Work  Clinical Social Work contacted by phone to assess psychosocial, emotional, mental health, and spiritual needs of the patient. Spoke w patient and w one of his church supporters, [name] w patient permission.   Barriers to care/review of distress screen:  - Transportation:  Do you anticipate any problems getting to appointments?  Do you have someone who can help run errands for you if you need it?  Referral made to [facility], will use when appointments are scheduled.  Can also link w [facility] for rides needed outside of [facility] system.  - Help at home:  What is your living situation (alone, family, other)?  If you are physically unable to care for yourself, who would you call on to help you?  Lives alone but has good support system.  "Does pretty well on his own,"  At some point, might need more help at home, but not at present.  - Support system:  What does your support system look like?  Who would you call on if you needed some kind of practical help?  What if you needed someone to talk to for emotional support?  Church members are very supportive, they go to doctors appointments because patient does not always understand what is said.  He had an aneurysm at age 45, "still need help w discerning and going over information."  Needs written and verbal instructions, then w someone going over it with him.  PCP is [facility].  Gets his medicine from [facility]. -  Finances:  Are you concerned about finances.  Considering returning to work?  If not, applying for disability?  Took early retirement, income is limited.  Gets small amount of Food Stamps.  Retired from [employer] work.  [employer]/Alexis is working w him on [employer] application - she has mailed out paperwork that he needs to sign/return to advance the process.  He is aware of this.    What is your understanding of where you are with your cancer? Its  cause?  Your treatment plan and what happens next?  Newly diagnosed w Stage 3 lung cancer, beginning chemotherapy and radiation treatment.    What are your worries for the future as you begin treatment for cancer?  Having some trouble sleeping at night, would like help w that.  Concerned about his voice 'coming back" post treatment.    What are your hopes and priorities during your treatment? What is important to you? What are your goals for your care?  Regaining his voice   CSW Summary:  Patient and family psychosocial functioning including strengths, limitations, and coping skills:n 64 year old male, newly diagnosed w lung cancer.  Lives alone but has significant support from church community.  They are often in his home, have helped provide transportation, helped him obtain an income subsidized apartment.  They also help him understand what is being said during MD appts as he does not always understand instructions or complex communications.  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  He is able to function on his own, but needs help negotiating more complex systems and processes.  With the support of his church community, he has been able to be fully independent.    Identifications of barriers to care:  Currently not insured.  Feels like he needs "someone to come in to put my medicine in the  right place" if he had to take "a lot of medicine."  "Make sure he has the inhalers he needs to use."    Availability of community resources:  [employer] gas card program. [place] Worker follow up needed: No.   Edwyna Shell, [place] Social Worker Phone:  [phone] Cell:  [phone]

## 2020-01-17 ENCOUNTER — Inpatient Hospital Stay (HOSPITAL_BASED_OUTPATIENT_CLINIC_OR_DEPARTMENT_OTHER): Payer: Self-pay | Admitting: Internal Medicine

## 2020-01-17 ENCOUNTER — Ambulatory Visit
Admission: RE | Admit: 2020-01-17 | Discharge: 2020-01-17 | Disposition: A | Discharge status: Home / Self Care | Payer: Self-pay | Source: Ambulatory Visit | Attending: Radiation Oncology | Admitting: Radiation Oncology

## 2020-01-17 ENCOUNTER — Other Ambulatory Visit: Payer: Self-pay

## 2020-01-17 ENCOUNTER — Inpatient Hospital Stay: Payer: Self-pay

## 2020-01-17 ENCOUNTER — Encounter: Payer: Self-pay | Admitting: Internal Medicine

## 2020-01-17 ENCOUNTER — Encounter: Payer: Self-pay | Admitting: *Deleted

## 2020-01-17 VITALS — BP 122/72 | HR 87 | Temp 97.8°F | Resp 18 | Ht 61.0 in | Wt 111.7 lb

## 2020-01-17 VITALS — BP 131/73 | HR 83 | Temp 98.4°F | Resp 18

## 2020-01-17 DIAGNOSIS — C3411 Malignant neoplasm of upper lobe, right bronchus or lung: Secondary | ICD-10-CM

## 2020-01-17 MED ORDER — SODIUM CHLORIDE 0.9 % IV SOLN
45.0000 mg/m2 | Freq: Once | INTRAVENOUS | Status: AC
  Administered 2020-01-17: 66 mg via INTRAVENOUS
  Filled 2020-01-17: qty 11

## 2020-01-17 MED ORDER — DIPHENHYDRAMINE HCL 50 MG/ML IJ SOLN
50.0000 mg | Freq: Once | INTRAMUSCULAR | Status: AC
  Administered 2020-01-17: 50 mg via INTRAVENOUS

## 2020-01-17 MED ORDER — PALONOSETRON HCL INJECTION 0.25 MG/5ML
0.2500 mg | Freq: Once | INTRAVENOUS | Status: AC
  Administered 2020-01-17: 0.25 mg via INTRAVENOUS

## 2020-01-17 MED ORDER — SODIUM CHLORIDE 0.9 % IV SOLN
20.0000 mg | Freq: Once | INTRAVENOUS | Status: AC
  Administered 2020-01-17: 20 mg via INTRAVENOUS
  Filled 2020-01-17: qty 20

## 2020-01-17 MED ORDER — FAMOTIDINE IN NACL 20-0.9 MG/50ML-% IV SOLN
20.0000 mg | Freq: Once | INTRAVENOUS | Status: AC
  Administered 2020-01-17: 20 mg via INTRAVENOUS

## 2020-01-17 MED ORDER — PALONOSETRON HCL INJECTION 0.25 MG/5ML
INTRAVENOUS | Status: AC
  Filled 2020-01-17: qty 5

## 2020-01-17 MED ORDER — FAMOTIDINE IN NACL 20-0.9 MG/50ML-% IV SOLN
INTRAVENOUS | Status: AC
  Filled 2020-01-17: qty 50

## 2020-01-17 MED ORDER — DIPHENHYDRAMINE HCL 50 MG/ML IJ SOLN
INTRAMUSCULAR | Status: AC
  Filled 2020-01-17: qty 1

## 2020-01-17 MED ORDER — SODIUM CHLORIDE 0.9 % IV SOLN
Freq: Once | INTRAVENOUS | Status: AC
  Filled 2020-01-17: qty 250

## 2020-01-17 MED ORDER — SODIUM CHLORIDE 0.9 % IV SOLN
189.8000 mg | Freq: Once | INTRAVENOUS | Status: AC
  Administered 2020-01-17: 190 mg via INTRAVENOUS
  Filled 2020-01-17: qty 19

## 2020-01-17 NOTE — Patient Instructions (Signed)
[facility] Discharge Instructions for Patients Receiving Chemotherapy  Today you received the following chemotherapy agents: Taxol and Carboplatin.  To help prevent nausea and vomiting after your treatment, we encourage you to take your nausea medication as directed.   If you develop nausea and vomiting that is not controlled by your nausea medication, call the clinic.   BELOW ARE SYMPTOMS THAT SHOULD BE REPORTED IMMEDIATELY:  *FEVER GREATER THAN 100.5 F  *CHILLS WITH OR WITHOUT FEVER  NAUSEA AND VOMITING THAT IS NOT CONTROLLED WITH YOUR NAUSEA MEDICATION  *UNUSUAL SHORTNESS OF BREATH  *UNUSUAL BRUISING OR BLEEDING  TENDERNESS IN MOUTH AND THROAT WITH OR WITHOUT PRESENCE OF ULCERS  *URINARY PROBLEMS  *BOWEL PROBLEMS  UNUSUAL RASH Items with * indicate a potential emergency and should be followed up as soon as possible.  Feel free to call the clinic should you have any questions or concerns. The clinic phone number is [phone].  Please show the CHEMO ALERT CARD at check-in to the Emergency Department and triage nurse.  Paclitaxel injection What is this medicine? PACLITAXEL (PAK li TAX el) is a chemotherapy drug. It targets fast dividing cells, like cancer cells, and causes these cells to die. This medicine is used to treat ovarian cancer, breast cancer, lung cancer, Kaposi's sarcoma, and other cancers. This medicine may be used for other purposes; ask your health care provider or pharmacist if you have questions. COMMON BRAND NAME(S): Onxol, Taxol What should I tell my health care provider before I take this medicine? They need to know if you have any of these conditions:  history of irregular heartbeat  liver disease  low blood counts, like low white cell, platelet, or red cell counts  lung or breathing disease, like asthma  tingling of the fingers or toes, or other nerve disorder  an unusual or allergic reaction to paclitaxel, alcohol,  polyoxyethylated castor oil, other chemotherapy, other medicines, foods, dyes, or preservatives  pregnant or trying to get pregnant  breast-feeding How should I use this medicine? This drug is given as an infusion into a vein. It is administered in a hospital or clinic by a specially trained health care professional. Talk to your pediatrician regarding the use of this medicine in children. Special care may be needed. Overdosage: If you think you have taken too much of this medicine contact a poison control center or emergency room at once. NOTE: This medicine is only for you. Do not share this medicine with others. What if I miss a dose? It is important not to miss your dose. Call your doctor or health care professional if you are unable to keep an appointment. What may interact with this medicine? Do not take this medicine with any of the following medications:  disulfiram  metronidazole This medicine may also interact with the following medications:  antiviral medicines for hepatitis, HIV or AIDS  certain antibiotics like erythromycin and clarithromycin  certain medicines for fungal infections like ketoconazole and itraconazole  certain medicines for seizures like carbamazepine, phenobarbital, phenytoin  gemfibrozil  nefazodone  rifampin  St. John's wort This list may not describe all possible interactions. Give your health care provider a list of all the medicines, herbs, non-prescription drugs, or dietary supplements you use. Also tell them if you smoke, drink alcohol, or use illegal drugs. Some items may interact with your medicine. What should I watch for while using this medicine? Your condition will be monitored carefully while you are receiving this medicine. You will need important   blood work done while you are taking this medicine. This medicine can cause serious allergic reactions. To reduce your risk you will need to take other medicine(s) before treatment with this  medicine. If you experience allergic reactions like skin rash, itching or hives, swelling of the face, lips, or tongue, tell your doctor or health care professional right away. In some cases, you may be given additional medicines to help with side effects. Follow all directions for their use. This drug may make you feel generally unwell. This is not uncommon, as chemotherapy can affect healthy cells as well as cancer cells. Report any side effects. Continue your course of treatment even though you feel ill unless your doctor tells you to stop. Call your doctor or health care professional for advice if you get a fever, chills or sore throat, or other symptoms of a cold or flu. Do not treat yourself. This drug decreases your body's ability to fight infections. Try to avoid being around people who are sick. This medicine may increase your risk to bruise or bleed. Call your doctor or health care professional if you notice any unusual bleeding. Be careful brushing and flossing your teeth or using a toothpick because you may get an infection or bleed more easily. If you have any dental work done, tell your dentist you are receiving this medicine. Avoid taking products that contain aspirin, acetaminophen, ibuprofen, naproxen, or ketoprofen unless instructed by your doctor. These medicines may hide a fever. Do not become pregnant while taking this medicine. Women should inform their doctor if they wish to become pregnant or think they might be pregnant. There is a potential for serious side effects to an unborn child. Talk to your health care professional or pharmacist for more information. Do not breast-feed an infant while taking this medicine. Men are advised not to father a child while receiving this medicine. This product may contain alcohol. Ask your pharmacist or healthcare provider if this medicine contains alcohol. Be sure to tell all healthcare providers you are taking this medicine. Certain medicines,  like metronidazole and disulfiram, can cause an unpleasant reaction when taken with alcohol. The reaction includes flushing, headache, nausea, vomiting, sweating, and increased thirst. The reaction can last from 30 minutes to several hours. What side effects may I notice from receiving this medicine? Side effects that you should report to your doctor or health care professional as soon as possible:  allergic reactions like skin rash, itching or hives, swelling of the face, lips, or tongue  breathing problems  changes in vision  fast, irregular heartbeat  high or low blood pressure  mouth sores  pain, tingling, numbness in the hands or feet  signs of decreased platelets or bleeding - bruising, pinpoint red spots on the skin, black, tarry stools, blood in the urine  signs of decreased red blood cells - unusually weak or tired, feeling faint or lightheaded, falls  signs of infection - fever or chills, cough, sore throat, pain or difficulty passing urine  signs and symptoms of liver injury like dark yellow or brown urine; general ill feeling or flu-like symptoms; light-colored stools; loss of appetite; nausea; right upper belly pain; unusually weak or tired; yellowing of the eyes or skin  swelling of the ankles, feet, hands  unusually slow heartbeat Side effects that usually do not require medical attention (report to your doctor or health care professional if they continue or are bothersome):  diarrhea  hair loss  loss of appetite  muscle or joint   pain  nausea, vomiting  pain, redness, or irritation at site where injected  tiredness This list may not describe all possible side effects. Call your doctor for medical advice about side effects. You may report side effects to FDA at 1-800-FDA-1088. Where should I keep my medicine? This drug is given in a hospital or clinic and will not be stored at home. NOTE: This sheet is a summary. It may not cover all possible information.  If you have questions about this medicine, talk to your doctor, pharmacist, or health care provider.  2020 [employer]vier/Gold Standard (2017-01-06 13:14:55) Carboplatin injection What is this medicine? CARBOPLATIN (KAR boe pla tin) is a chemotherapy drug. It targets fast dividing cells, like cancer cells, and causes these cells to die. This medicine is used to treat ovarian cancer and many other cancers. This medicine may be used for other purposes; ask your health care provider or pharmacist if you have questions. COMMON BRAND NAME(S): Paraplatin What should I tell my health care provider before I take this medicine? They need to know if you have any of these conditions:  blood disorders  hearing problems  kidney disease  recent or ongoing radiation therapy  an unusual or allergic reaction to carboplatin, cisplatin, other chemotherapy, other medicines, foods, dyes, or preservatives  pregnant or trying to get pregnant  breast-feeding How should I use this medicine? This drug is usually given as an infusion into a vein. It is administered in a hospital or clinic by a specially trained health care professional. Talk to your pediatrician regarding the use of this medicine in children. Special care may be needed. Overdosage: If you think you have taken too much of this medicine contact a poison control center or emergency room at once. NOTE: This medicine is only for you. Do not share this medicine with others. What if I miss a dose? It is important not to miss a dose. Call your doctor or health care professional if you are unable to keep an appointment. What may interact with this medicine?  medicines for seizures  medicines to increase blood counts like filgrastim, pegfilgrastim, sargramostim  some antibiotics like amikacin, gentamicin, neomycin, streptomycin, tobramycin  vaccines Talk to your doctor or health care professional before taking any of these  medicines:  acetaminophen  aspirin  ibuprofen  ketoprofen  naproxen This list may not describe all possible interactions. Give your health care provider a list of all the medicines, herbs, non-prescription drugs, or dietary supplements you use. Also tell them if you smoke, drink alcohol, or use illegal drugs. Some items may interact with your medicine. What should I watch for while using this medicine? Your condition will be monitored carefully while you are receiving this medicine. You will need important blood work done while you are taking this medicine. This drug may make you feel generally unwell. This is not uncommon, as chemotherapy can affect healthy cells as well as cancer cells. Report any side effects. Continue your course of treatment even though you feel ill unless your doctor tells you to stop. In some cases, you may be given additional medicines to help with side effects. Follow all directions for their use. Call your doctor or health care professional for advice if you get a fever, chills or sore throat, or other symptoms of a cold or flu. Do not treat yourself. This drug decreases your body's ability to fight infections. Try to avoid being around people who are sick. This medicine may increase your risk to bruise or   bleed. Call your doctor or health care professional if you notice any unusual bleeding. Be careful brushing and flossing your teeth or using a toothpick because you may get an infection or bleed more easily. If you have any dental work done, tell your dentist you are receiving this medicine. Avoid taking products that contain aspirin, acetaminophen, ibuprofen, naproxen, or ketoprofen unless instructed by your doctor. These medicines may hide a fever. Do not become pregnant while taking this medicine. Women should inform their doctor if they wish to become pregnant or think they might be pregnant. There is a potential for serious side effects to an unborn child. Talk  to your health care professional or pharmacist for more information. Do not breast-feed an infant while taking this medicine. What side effects may I notice from receiving this medicine? Side effects that you should report to your doctor or health care professional as soon as possible:  allergic reactions like skin rash, itching or hives, swelling of the face, lips, or tongue  signs of infection - fever or chills, cough, sore throat, pain or difficulty passing urine  signs of decreased platelets or bleeding - bruising, pinpoint red spots on the skin, black, tarry stools, nosebleeds  signs of decreased red blood cells - unusually weak or tired, fainting spells, lightheadedness  breathing problems  changes in hearing  changes in vision  chest pain  high blood pressure  low blood counts - This drug may decrease the number of white blood cells, red blood cells and platelets. You may be at increased risk for infections and bleeding.  nausea and vomiting  pain, swelling, redness or irritation at the injection site  pain, tingling, numbness in the hands or feet  problems with balance, talking, walking  trouble passing urine or change in the amount of urine Side effects that usually do not require medical attention (report to your doctor or health care professional if they continue or are bothersome):  hair loss  loss of appetite  metallic taste in the mouth or changes in taste This list may not describe all possible side effects. Call your doctor for medical advice about side effects. You may report side effects to FDA at 1-800-FDA-1088. Where should I keep my medicine? This drug is given in a hospital or clinic and will not be stored at home. NOTE: This sheet is a summary. It may not cover all possible information. If you have questions about this medicine, talk to your doctor, pharmacist, or health care provider.  2020 [employer]vier/Gold Standard (2007-08-10 14:38:05)  

## 2020-01-17 NOTE — Progress Notes (Signed)
Per MD Julien Nordmann, ok to treat with CBC and CMP from 01/03/20.

## 2020-01-17 NOTE — Progress Notes (Signed)
[place] [phone]   [phone]  OFFICE PROGRESS NOTE  [name], NP [id number] [place] [id number]  DIAGNOSIS: Stage IIIA (Tx, N2, M0) non-small cell lung cancer, adenocarcinoma diagnosed in August 2021 and presented with large right paratracheal soft tissue mass extending from the right apex to the right suprahilar and precarinal region.  PRIOR THERAPY: None.  CURRENT THERAPY: Concurrent chemoradiation with weekly carboplatin for AUC of 2 and paclitaxel 45 mg/M2.  First dose January 17, 2020.  INTERVAL HISTORY: [name] [name] 64 y.o. male returns to the clinic today for follow-up visit.  The patient is feeling fine today with no concerning complaints except for hoarseness of his voice.  The patient denied having any current chest pain, shortness of breath, cough or hemoptysis.  He denied having any fever or chills.  He has no nausea, vomiting, diarrhea or constipation.  He has no headache or visual changes.  He had a PET scan performed recently and the patient is here today for evaluation and discussion of his scan results before starting the first dose of concurrent chemoradiation.   MEDICAL HISTORY: Past Medical History:  Diagnosis Date   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[place]) 12/25/2019   COPD (chronic obstructive pulmonary disease) ([place]) 11/02/2007   Qualifier: Diagnosis of  By: Melvyn Novas MD, Christena Deem    Hypertension 12/24/2019   Iron deficiency anemia 08/23/2007   Qualifier: Diagnosis of  By: Jim Like      ALLERGIES:  has No Known Allergies.  MEDICATIONS:  Current Outpatient Medications  Medication Sig Dispense Refill   acetaminophen (TYLENOL) 500 MG tablet Take 500 mg by mouth every 6 (six) hours as needed for mild pain or moderate pain.     aspirin 81 MG EC tablet Take 1 tablet (81 mg total) by mouth daily. 30 tablet 11   budesonide-formoterol (SYMBICORT) 80-4.5 MCG/ACT inhaler Inhale 2 puffs into the lungs 2  (two) times daily. (Patient taking differently: Inhale 2 puffs into the lungs 2 (two) times daily as needed (wheezing, SOB). ) 1 Inhaler 11   feeding supplement, ENSURE ENLIVE, (ENSURE ENLIVE) LIQD Take 237 mLs by mouth 2 (two) times daily between meals. 237 mL 12   Multiple Vitamin (MULTIVITAMIN) tablet Take 1 tablet by mouth daily.     prochlorperazine (COMPAZINE) 10 MG tablet Take 1 tablet (10 mg total) by mouth every 6 (six) hours as needed for nausea or vomiting. 30 tablet 0   senna-docusate (SENOKOT-S) 8.6-50 MG tablet Take 1 tablet by mouth at bedtime.     SPIRIVA HANDIHALER 18 MCG inhalation capsule 1 capsule daily.     VENTOLIN HFA 108 (90 Base) MCG/ACT inhaler Inhale 2 puffs into the lungs every 4 (four) hours as needed.     No current facility-administered medications for this visit.    SURGICAL HISTORY:  Past Surgical History:  Procedure Laterality Date   ENDOBRONCHIAL ULTRASOUND N/A 12/27/2019   Procedure: ENDOBRONCHIAL ULTRASOUND;  Surgeon: Laurin Coder, MD;  Location: [facility];  Service: Endoscopy;  Laterality: N/A;   FINE NEEDLE ASPIRATION  12/27/2019   Procedure: FINE NEEDLE ASPIRATION;  Surgeon: Laurin Coder, MD;  Location: [facility];  Service: Endoscopy;;   VIDEO BRONCHOSCOPY N/A 12/27/2019   Procedure: VIDEO BRONCHOSCOPY WITHOUT FLUORO;  Surgeon: Laurin Coder, MD;  Location: [facility];  Service: Endoscopy;  Laterality: N/A;    REVIEW OF SYSTEMS:  Constitutional: positive for fatigue Eyes: negative Ears, nose, mouth, throat, and face: positive for hoarseness  Respiratory: positive for dyspnea on exertion Cardiovascular: negative Gastrointestinal: negative Genitourinary:negative Integument/breast: negative Hematologic/lymphatic: negative Musculoskeletal:negative Neurological: negative Behavioral/Psych: negative Endocrine: negative Allergic/Immunologic: negative   PHYSICAL EXAMINATION: General appearance: alert, cooperative,  fatigued and no distress Head: Normocephalic, without obvious abnormality, atraumatic Neck: no adenopathy, no JVD, supple, symmetrical, trachea midline and thyroid not enlarged, symmetric, no tenderness/mass/nodules Lymph nodes: Cervical, supraclavicular, and axillary nodes normal. Resp: clear to auscultation bilaterally Back: symmetric, no curvature. ROM normal. No CVA tenderness. Cardio: regular rate and rhythm, S1, S2 normal, no murmur, click, rub or gallop GI: soft, non-tender; bowel sounds normal; no masses,  no organomegaly Extremities: extremities normal, atraumatic, no cyanosis or edema Neurologic: Alert and oriented X 3, normal strength and tone. Normal symmetric reflexes. Normal coordination and gait  ECOG PERFORMANCE STATUS: 1 - Symptomatic but completely ambulatory  Blood pressure 122/72, pulse 87, temperature 97.8 F (36.6 C), temperature source Tympanic, resp. rate 18, height 5\' 1"  (1.549 m), weight 111 lb 11.2 oz (50.7 kg), SpO2 96 %.  LABORATORY DATA: Lab Results  Component Value Date   WBC 5.2 01/03/2020   HGB 12.7 (L) 01/03/2020   HCT 40.1 01/03/2020   MCV 69.6 (L) 01/03/2020   PLT 425 (H) 01/03/2020      Chemistry      Component Value Date/Time   NA 132 (L) 01/03/2020 1443   NA 144 08/03/2019 1527   K 4.3 01/03/2020 1443   CL 96 (L) 01/03/2020 1443   CO2 27 01/03/2020 1443   BUN 11 01/03/2020 1443   BUN 9 08/03/2019 1527   CREATININE 0.75 01/03/2020 1443      Component Value Date/Time   CALCIUM 10.6 (H) 01/03/2020 1443   ALKPHOS 62 01/03/2020 1443   AST 21 01/03/2020 1443   ALT 13 01/03/2020 1443   BILITOT 0.3 01/03/2020 1443       RADIOGRAPHIC STUDIES: DG Chest 2 View  Result Date: 12/24/2019 CLINICAL DATA:  Upper back pain. EXAM: CHEST - 2 VIEW COMPARISON:  10/22/2017 FINDINGS: Heart is normal size. Large soft tissue mass in the right paratracheal region and medial left lower lobe. Left lung clear. No effusions or acute bony abnormality.  IMPRESSION: Large right paratracheal soft tissue mass. Recommend further evaluation with chest CT with IV contrast. Electronically Signed   By: Rolm Baptise M.D.   On: 12/24/2019 17:36   DG Cervical Spine Complete  Result Date: 12/24/2019 CLINICAL DATA:  Mid upper back pain.  No known injury. EXAM: CERVICAL SPINE - COMPLETE 4+ VIEW COMPARISON:  None. FINDINGS: Degenerative disc disease changes with disc space narrowing and anterior spurring. Bilateral degenerative facet disease. Severe bilateral neural foraminal narrowing at C4-5. No fracture or malalignment. Prevertebral soft tissues are normal. IMPRESSION: Degenerative disc and facet disease as above. No acute bony abnormality. Electronically Signed   By: Rolm Baptise M.D.   On: 12/24/2019 17:35   DG Thoracic Spine 2 View  Result Date: 12/24/2019 CLINICAL DATA:  Chest pain, upper back pain EXAM: THORACIC SPINE 2 VIEWS COMPARISON:  10/22/2017 FINDINGS: Soft tissue mass noted in the right paratracheal region. This is new since prior study. No acute bony abnormality. No fracture or malalignment. No focal bone lesion. IMPRESSION: No acute bony abnormality. Large right paratracheal soft tissue mass. Recommend chest CT with IV contrast for further evaluation. Electronically Signed   By: Rolm Baptise M.D.   On: 12/24/2019 17:35   CT HEAD W & WO CONTRAST  Result Date: 12/28/2019 CLINICAL DATA:  Non-small-cell lung cancer.  Pretreatment  staging. EXAM: CT HEAD WITHOUT AND WITH CONTRAST TECHNIQUE: Contiguous axial images were obtained from the base of the skull through the vertex without and with intravenous contrast CONTRAST:  22mL OMNIPAQUE IOHEXOL 300 MG/ML  SOLN COMPARISON:  None. 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 There is mild encephalomalacia in the right temporal lobe. Ventricle size normal. 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 Negative for acute infarct,  hemorrhage, mass No enhancing metastatic lesions identified. Vascular: Negative for hyperdense vessel. Normal arterial and venous enhancement. Skull: Right pterional craniotomy for aneurysm clipping. Sinuses/Orbits: Paranasal sinuses clear.  Negative orbit Other: None IMPRESSION: 1. Negative for metastatic disease to the brain 2. Craniotomy for aneurysm clipping. Electronically Signed   By: Franchot Gallo M.D.   On: 12/28/2019 13:58   CT CHEST ABDOMEN PELVIS W CONTRAST  Result Date: 12/24/2019 CLINICAL DATA:  Right paratracheal soft tissue mass found on chest x-ray. EXAM: CT CHEST, ABDOMEN, AND PELVIS WITH CONTRAST TECHNIQUE: Multidetector CT imaging of the chest, abdomen and pelvis was performed following the standard protocol during bolus administration of intravenous contrast. CONTRAST:  117mL OMNIPAQUE IOHEXOL 300 MG/ML  SOLN COMPARISON:  May 30, 2008 FINDINGS: CT CHEST FINDINGS Cardiovascular: No significant vascular findings. Normal heart size. No pericardial effusion. Mediastinum/Nodes: A 6.7 cm x 6.8 cm x 8.6 cm lobulated heterogeneous low-attenuation right paratracheal soft tissue mass is seen. This extends from the right apex to the right suprahilar and precarinal regions. Thyroid gland, trachea, and esophagus demonstrate no significant findings. Lungs/Pleura: The lungs are hyperinflated. Moderate severity bullous changes are seen along the anterior aspect of the mid and upper left lung. Adjacent 6 mm and 4 mm noncalcified lung nodule is seen within the anterior aspect of the left upper lobe. There is no evidence of acute infiltrate, pleural effusion or pneumothorax. Musculoskeletal: No acute osseous abnormalities are identified. CT ABDOMEN PELVIS FINDINGS Hepatobiliary: A 7 mm cystic appearing area is seen within the anterior aspect of the left lobe of the liver. No gallstones, gallbladder wall thickening, or biliary dilatation. Pancreas: Unremarkable. No pancreatic ductal dilatation or surrounding  inflammatory changes. Spleen: Normal in size without focal abnormality. Adrenals/Urinary Tract: Adrenal glands are unremarkable. Kidneys are normal, without renal calculi, focal lesion, or hydronephrosis. Bladder is unremarkable. Stomach/Bowel: Stomach is within normal limits. Appendix appears normal. No evidence of bowel wall thickening, distention, or inflammatory changes. Vascular/Lymphatic: There is moderate severity calcification of the abdominal aorta and bilateral common iliac arteries, without evidence of aneurysmal dilatation. No enlarged abdominal or pelvic lymph nodes. Reproductive: There is moderate to marked severity prostate gland enlargement. Other: No abdominal wall hernia or abnormality. No abdominopelvic ascites. Musculoskeletal: Grade 1 anterolisthesis of the L5 vertebral body on S1 is seen. IMPRESSION: 1. 6.7 cm x 6.8 cm x 8.6 cm lobulated heterogeneous low-attenuation right paratracheal soft tissue mass. This extends from the right apex to the right suprahilar and precarinal regions and is consistent with a neoplastic process. 2. Adjacent 6 mm and 4 mm noncalcified lung nodule within the anterior aspect of the left upper lobe. Correlation with follow-up chest CT at 12 months is recommended. 3. Moderate severity bullous changes along the anterior aspect of the mid and upper left lung. 4. Moderate to marked severity prostate gland enlargement. 5. Grade 1 anterolisthesis of the L5 vertebral body on S1. 6. Small cyst suspected within the left lobe of the liver. Aortic Atherosclerosis (ICD10-I70.0). Electronically Signed   By: Virgina Norfolk  M.D.   On: 12/24/2019 19:39   NM PET Image Initial (PI) Skull Base To Thigh  Result Date: 01/11/2020 CLINICAL DATA:  Initial treatment strategy for new diagnosis of non-small cell lung cancer. Biopsy on 12/27/2019. No COVID vaccine. EXAM: NUCLEAR MEDICINE PET SKULL BASE TO THIGH TECHNIQUE: 6.7 mCi F-18 FDG was injected intravenously. Full-ring PET imaging  was performed from the skull base to thigh after the radiotracer. CT data was obtained and used for attenuation correction and anatomic localization. Fasting blood glucose: 78 mg/dl COMPARISON:  Chest abdomen and pelvic CTs of 12/24/2019. Chest radiograph 12/24/2019. FINDINGS: Mediastinal blood pool activity: SUV max 2.0 Liver activity: SUV max NA NECK: Low cervical activity is felt to be a combination of muscular and possibly minimal hypermetabolic "brown" fat. No convincing evidence of cervical nodal hypermetabolism. Incidental CT findings: No cervical adenopathy. Right frontal craniotomy for aneurysm clipping. CHEST: Dominant right paratracheal nodal mass measures 6.8 by 7.0 cm and a S.U.V. max of 18.1 on 53/4. Central necrosis identified within. Adjacent, likely separate low paratracheal node of 2.1 cm and a S.U.V. max of 11.063/4. Right hilar hypermetabolism corresponding to borderline adenopathy on prior diagnostic CT. Example at a S.U.V. max of 4.8 on 72/4. A subcarinal node measures 12 mm and a S.U.V. max of 3.3 on 69/4. Low-level hypermetabolism corresponding to a portion of the left upper lobe volume loss and cystic bronchiectasis. Example at a S.U.V. max of 1.7 on 19/8. The adjacent lingular nodules described on CT are without hypermetabolic correlate on 69/6. Incidental CT findings: Emphysema.  Mild mediastinal shift left. ABDOMEN/PELVIS: No abdominopelvic parenchymal or nodal hypermetabolism. Hypermetabolism about the distal penis is presumably due to urinary contamination. Incidental CT findings: Normal adrenal glands. Abdominal aortic atherosclerosis. Mild prostatomegaly. SKELETON: No abnormal marrow activity. Incidental CT findings: Probable bone island in the right iliac. Bilateral L5 pars defects with grade 1 L5-S1 anterolisthesis. IMPRESSION: 1. Marked hypermetabolic mediastinal nodal metastasis. No pulmonary parenchymal primary identified. Low-level hypermetabolism within a portion of an area of  presumed post infectious/inflammatory left upper lobe scarring is favored to be benign. 2. Equivocal right hilar node, suspicious based on location. 3. No hypermetabolic extrathoracic metastatic disease identified. 4. Incidental findings, including: Coronary artery atherosclerosis. Aortic Atherosclerosis (ICD10-I70.0). Electronically Signed   By: Abigail Miyamoto M.D.   On: 01/11/2020 17:04    ASSESSMENT AND PLAN: This is a very pleasant 64 years old African-American male recently diagnosed with stage IIIA (Tx, N2, M0) non-small cell lung cancer, adenocarcinoma presented with large right paratracheal soft tissue mass extending from the right apex to the right suprahilar and precarinal region diagnosed in August 2021. The patient had a PET scan performed recently.  I personally and independently reviewed the PET scan and discussed the results with the patient and his caregiver who was available by phone. The PET scan showed no evidence for metastatic disease outside the chest. I recommended for the patient to proceed with the course of concurrent chemoradiation as planned. He will start the first dose of this treatment today. We also requested the blood test to be sent to [facility] for molecular studies. The patient will come back for follow-up visit in 2 weeks for evaluation before starting cycle #3 of his treatment. He was advised to call immediately if he has any concerning symptoms in the interval. The patient voices understanding of current disease status and treatment options and is in agreement with the current care plan.  All questions were answered. The patient knows to call the  clinic with any problems, questions or concerns. We can certainly see the patient much sooner if necessary.  The total time spent in the appointment was 30 minutes.  Disclaimer: This note was dictated with voice recognition software. Similar sounding words can inadvertently be transcribed and may not be corrected upon  review.

## 2020-01-17 NOTE — Progress Notes (Signed)
[place] Work  Holiday representative met with patient in the infusion room to review and complete [employer] [facility] ([employer]) application.  CSW and patient reviewed and all questions were answered.  CSW completed and submitted application.  [facility] will contact patient once application is reviewed.    Johnnye Lana, MSW, LCSW, OSW-C Clinical Social Worker [facility] [phone]

## 2020-01-17 NOTE — Patient Instructions (Signed)
Steps to Quit Smoking Smoking tobacco is the leading cause of preventable death. It can affect almost every organ in the body. Smoking puts you and people around you at risk for many serious, long-lasting (chronic) diseases. Quitting smoking can be hard, but it is one of the best things that you can do for your health. It is never too late to quit. How do I get ready to quit? When you decide to quit smoking, make a plan to help you succeed. Before you quit:  Pick a date to quit. Set a date within the next 2 weeks to give you time to prepare.  Write down the reasons why you are quitting. Keep this list in places where you will see it often.  Tell your family, friends, and co-workers that you are quitting. Their support is important.  Talk with your doctor about the choices that may help you quit.  Find out if your health insurance will pay for these treatments.  Know the people, places, things, and activities that make you want to smoke (triggers). Avoid them. What first steps can I take to quit smoking?  Throw away all cigarettes at home, at work, and in your car.  Throw away the things that you use when you smoke, such as ashtrays and lighters.  Clean your car. Make sure to empty the ashtray.  Clean your home, including curtains and carpets. What can I do to help me quit smoking? Talk with your doctor about taking medicines and seeing a counselor at the same time. You are more likely to succeed when you do both.  If you are pregnant or breastfeeding, talk with your doctor about counseling or other ways to quit smoking. Do not take medicine to help you quit smoking unless your doctor tells you to do so. To quit smoking: Quit right away  Quit smoking totally, instead of slowly cutting back on how much you smoke over a period of time.  Go to counseling. You are more likely to quit if you go to counseling sessions regularly. Take medicine You may take medicines to help you quit. Some  medicines need a prescription, and some you can buy over-the-counter. Some medicines may contain a drug called nicotine to replace the nicotine in cigarettes. Medicines may:  Help you to stop having the desire to smoke (cravings).  Help to stop the problems that come when you stop smoking (withdrawal symptoms). Your doctor may ask you to use:  Nicotine patches, gum, or lozenges.  Nicotine inhalers or sprays.  Non-nicotine medicine that is taken by mouth. Find resources Find resources and other ways to help you quit smoking and remain smoke-free after you quit. These resources are most helpful when you use them often. They include:  Online chats with a counselor.  Phone quitlines.  Printed self-help materials.  Support groups or group counseling.  Text messaging programs.  Mobile phone apps. Use apps on your mobile phone or tablet that can help you stick to your quit plan. There are many free apps for mobile phones and tablets as well as websites. Examples include Quit Guide from the CDC and smokefree.gov  What things can I do to make it easier to quit?   Talk to your family and friends. Ask them to support and encourage you.  Call a phone quitline (1-800-QUIT-NOW), reach out to support groups, or work with a counselor.  Ask people who smoke to not smoke around you.  Avoid places that make you want to smoke,   such as: ? Bars. ? Parties. ? Smoke-break areas at work.  Spend time with people who do not smoke.  Lower the stress in your life. Stress can make you want to smoke. Try these things to help your stress: ? Getting regular exercise. ? Doing deep-breathing exercises. ? Doing yoga. ? Meditating. ? Doing a body scan. To do this, close your eyes, focus on one area of your body at a time from head to toe. Notice which parts of your body are tense. Try to relax the muscles in those areas. How will I feel when I quit smoking? Day 1 to 3 weeks Within the first 24 hours,  you may start to have some problems that come from quitting tobacco. These problems are very bad 2-3 days after you quit, but they do not often last for more than 2-3 weeks. You may get these symptoms:  Mood swings.  Feeling restless, nervous, angry, or annoyed.  Trouble concentrating.  Dizziness.  Strong desire for high-sugar foods and nicotine.  Weight gain.  Trouble pooping (constipation).  Feeling like you may vomit (nausea).  Coughing or a sore throat.  Changes in how the medicines that you take for other issues work in your body.  Depression.  Trouble sleeping (insomnia). Week 3 and afterward After the first 2-3 weeks of quitting, you may start to notice more positive results, such as:  Better sense of smell and taste.  Less coughing and sore throat.  Slower heart rate.  Lower blood pressure.  Clearer skin.  Better breathing.  Fewer sick days. Quitting smoking can be hard. Do not give up if you fail the first time. Some people need to try a few times before they succeed. Do your best to stick to your quit plan, and talk with your doctor if you have any questions or concerns. Summary  Smoking tobacco is the leading cause of preventable death. Quitting smoking can be hard, but it is one of the best things that you can do for your health.  When you decide to quit smoking, make a plan to help you succeed.  Quit smoking right away, not slowly over a period of time.  When you start quitting, seek help from your doctor, family, or friends. This information is not intended to replace advice given to you by your health care provider. Make sure you discuss any questions you have with your health care provider. Document Revised: 01/28/2019 Document Reviewed: 07/24/2018 Elsevier Patient Education  2020 Elsevier Inc.  

## 2020-01-18 ENCOUNTER — Ambulatory Visit
Admission: RE | Admit: 2020-01-18 | Discharge: 2020-01-18 | Disposition: A | Discharge status: Home / Self Care | Payer: Self-pay | Source: Ambulatory Visit | Attending: Radiation Oncology | Admitting: Radiation Oncology

## 2020-01-18 ENCOUNTER — Other Ambulatory Visit: Payer: Self-pay

## 2020-01-18 DIAGNOSIS — Z510 Encounter for antineoplastic radiation therapy: Secondary | ICD-10-CM | POA: Insufficient documentation

## 2020-01-18 DIAGNOSIS — C3411 Malignant neoplasm of upper lobe, right bronchus or lung: Secondary | ICD-10-CM | POA: Insufficient documentation

## 2020-01-19 ENCOUNTER — Encounter: Payer: Self-pay | Admitting: Internal Medicine

## 2020-01-19 ENCOUNTER — Ambulatory Visit
Admission: RE | Admit: 2020-01-19 | Discharge: 2020-01-19 | Disposition: A | Discharge status: Home / Self Care | Payer: Self-pay | Source: Ambulatory Visit | Attending: Radiation Oncology | Admitting: Radiation Oncology

## 2020-01-19 NOTE — Progress Notes (Signed)
Received referral voicemail from RN forwarded from [facility]([place]) verifying insurance information/self-pay status.  Reviewed notes from [place] and saw that a Medicaid application was mailed to patient last week to complete and return.  Called patient to introduce myself as Arboriculturist and to confirm he received the application. Patient states he did and has someone assisting him with it.  Discussed one-time $1000 Radio broadcast assistant to assist with personal expenses while going through treatment. Advised what is needed to apply. Will give him my card at appointment today in person.  Called [facility](Faye) back at [phone] option 1 to confirm patient is self-pay and in process of applying for Medicaid. She states she will list patient as indigent and send a fax confirmation form to be completed and faxed back. Provided fax number for correspondence to be sent to.

## 2020-01-19 NOTE — Progress Notes (Signed)
Met with patient at lobby to provide my card and advise again what is needed to apply for [employer] grant.  He verbalized understanding. 

## 2020-01-20 ENCOUNTER — Other Ambulatory Visit: Payer: Self-pay

## 2020-01-20 ENCOUNTER — Telehealth: Payer: Self-pay | Admitting: General Practice

## 2020-01-20 ENCOUNTER — Ambulatory Visit
Admission: RE | Admit: 2020-01-20 | Discharge: 2020-01-20 | Disposition: A | Discharge status: Home / Self Care | Payer: Self-pay | Source: Ambulatory Visit | Attending: Radiation Oncology | Admitting: Radiation Oncology

## 2020-01-20 NOTE — Telephone Encounter (Signed)
[place] CSW Progress Notes  Advised patient the [place] has mailed him a gas card.  Edwyna Shell, LCSW Clinical Social Worker Phone:  [phone]

## 2020-01-24 ENCOUNTER — Inpatient Hospital Stay: Payer: Self-pay

## 2020-01-24 ENCOUNTER — Other Ambulatory Visit: Payer: Self-pay

## 2020-01-24 ENCOUNTER — Inpatient Hospital Stay: Payer: Self-pay | Attending: Internal Medicine

## 2020-01-24 ENCOUNTER — Ambulatory Visit
Admission: RE | Admit: 2020-01-24 | Discharge: 2020-01-24 | Disposition: A | Discharge status: Home / Self Care | Payer: Self-pay | Source: Ambulatory Visit | Attending: Radiation Oncology | Admitting: Radiation Oncology

## 2020-01-24 VITALS — BP 114/65 | HR 100 | Temp 98.6°F | Resp 20 | Ht 61.0 in | Wt 109.0 lb

## 2020-01-24 DIAGNOSIS — C3491 Malignant neoplasm of unspecified part of right bronchus or lung: Secondary | ICD-10-CM | POA: Insufficient documentation

## 2020-01-24 DIAGNOSIS — Z5111 Encounter for antineoplastic chemotherapy: Secondary | ICD-10-CM | POA: Insufficient documentation

## 2020-01-24 DIAGNOSIS — C3411 Malignant neoplasm of upper lobe, right bronchus or lung: Secondary | ICD-10-CM

## 2020-01-24 LAB — CBC WITH DIFFERENTIAL (CANCER CENTER ONLY)
Abs Immature Granulocytes: 0.03 10*3/uL (ref 0.00–0.07)
Basophils Absolute: 0 10*3/uL (ref 0.0–0.1)
Basophils Relative: 1 %
Eosinophils Absolute: 0.1 10*3/uL (ref 0.0–0.5)
Eosinophils Relative: 2 %
HCT: 35.3 % — ABNORMAL LOW (ref 39.0–52.0)
Hemoglobin: 11.1 g/dL — ABNORMAL LOW (ref 13.0–17.0)
Immature Granulocytes: 1 %
Lymphocytes Relative: 16 %
Lymphs Abs: 0.5 10*3/uL — ABNORMAL LOW (ref 0.7–4.0)
MCH: 21.8 pg — ABNORMAL LOW (ref 26.0–34.0)
MCHC: 31.4 g/dL (ref 30.0–36.0)
MCV: 69.2 fL — ABNORMAL LOW (ref 80.0–100.0)
Monocytes Absolute: 0.4 10*3/uL (ref 0.1–1.0)
Monocytes Relative: 12 %
Neutro Abs: 2.2 10*3/uL (ref 1.7–7.7)
Neutrophils Relative %: 68 %
Platelet Count: 329 10*3/uL (ref 150–400)
RBC: 5.1 MIL/uL (ref 4.22–5.81)
RDW: 14.7 % (ref 11.5–15.5)
WBC Count: 3.2 10*3/uL — ABNORMAL LOW (ref 4.0–10.5)
nRBC: 0 % (ref 0.0–0.2)

## 2020-01-24 LAB — CMP (CANCER CENTER ONLY)
ALT: 12 U/L (ref 0–44)
AST: 16 U/L (ref 15–41)
Albumin: 3.4 g/dL — ABNORMAL LOW (ref 3.5–5.0)
Alkaline Phosphatase: 58 U/L (ref 38–126)
Anion gap: 9 (ref 5–15)
BUN: 17 mg/dL (ref 8–23)
CO2: 26 mmol/L (ref 22–32)
Calcium: 9.8 mg/dL (ref 8.9–10.3)
Chloride: 98 mmol/L (ref 98–111)
Creatinine: 0.81 mg/dL (ref 0.61–1.24)
GFR, Est AFR Am: >60 mL/min (ref >60)
GFR, Estimated: >60 mL/min (ref >60)
Glucose, Bld: 162 mg/dL — ABNORMAL HIGH (ref 70–99)
Potassium: 3.8 mmol/L (ref 3.5–5.1)
Sodium: 133 mmol/L — ABNORMAL LOW (ref 135–145)
Total Bilirubin: 0.3 mg/dL (ref 0.3–1.2)
Total Protein: 8 g/dL (ref 6.5–8.1)

## 2020-01-24 MED ORDER — SODIUM CHLORIDE 0.9 % IV SOLN
189.8000 mg | Freq: Once | INTRAVENOUS | Status: AC
  Administered 2020-01-24: 190 mg via INTRAVENOUS
  Filled 2020-01-24: qty 19

## 2020-01-24 MED ORDER — SODIUM CHLORIDE 0.9 % IV SOLN
Freq: Once | INTRAVENOUS | Status: AC
  Filled 2020-01-24: qty 250

## 2020-01-24 MED ORDER — FAMOTIDINE IN NACL 20-0.9 MG/50ML-% IV SOLN
INTRAVENOUS | Status: AC
  Filled 2020-01-24: qty 50

## 2020-01-24 MED ORDER — DIPHENHYDRAMINE HCL 50 MG/ML IJ SOLN
INTRAMUSCULAR | Status: AC
  Filled 2020-01-24: qty 1

## 2020-01-24 MED ORDER — FAMOTIDINE IN NACL 20-0.9 MG/50ML-% IV SOLN
20.0000 mg | Freq: Once | INTRAVENOUS | Status: AC
  Administered 2020-01-24: 20 mg via INTRAVENOUS

## 2020-01-24 MED ORDER — SODIUM CHLORIDE 0.9 % IV SOLN
45.0000 mg/m2 | Freq: Once | INTRAVENOUS | Status: AC
  Administered 2020-01-24: 66 mg via INTRAVENOUS
  Filled 2020-01-24: qty 11

## 2020-01-24 MED ORDER — DIPHENHYDRAMINE HCL 50 MG/ML IJ SOLN
50.0000 mg | Freq: Once | INTRAMUSCULAR | Status: AC
  Administered 2020-01-24: 50 mg via INTRAVENOUS

## 2020-01-24 MED ORDER — PALONOSETRON HCL INJECTION 0.25 MG/5ML
INTRAVENOUS | Status: AC
  Filled 2020-01-24: qty 5

## 2020-01-24 MED ORDER — PALONOSETRON HCL INJECTION 0.25 MG/5ML
0.2500 mg | Freq: Once | INTRAVENOUS | Status: AC
  Administered 2020-01-24: 0.25 mg via INTRAVENOUS

## 2020-01-24 MED ORDER — SODIUM CHLORIDE 0.9 % IV SOLN
20.0000 mg | Freq: Once | INTRAVENOUS | Status: AC
  Administered 2020-01-24: 20 mg via INTRAVENOUS
  Filled 2020-01-24: qty 20

## 2020-01-24 NOTE — Patient Instructions (Signed)
[facility] Discharge Instructions for Patients Receiving Chemotherapy  Today you received the following chemotherapy agents Paclitaxel (TAXOL) & Carboplatin (PARAPLATIN).  To help prevent nausea and vomiting after your treatment, we encourage you to take your nausea medication as prescribed.  If you develop nausea and vomiting that is not controlled by your nausea medication, call the clinic.   BELOW ARE SYMPTOMS THAT SHOULD BE REPORTED IMMEDIATELY:  *FEVER GREATER THAN 100.5 F  *CHILLS WITH OR WITHOUT FEVER  NAUSEA AND VOMITING THAT IS NOT CONTROLLED WITH YOUR NAUSEA MEDICATION  *UNUSUAL SHORTNESS OF BREATH  *UNUSUAL BRUISING OR BLEEDING  TENDERNESS IN MOUTH AND THROAT WITH OR WITHOUT PRESENCE OF ULCERS  *URINARY PROBLEMS  *BOWEL PROBLEMS  UNUSUAL RASH Items with * indicate a potential emergency and should be followed up as soon as possible.  Feel free to call the clinic should you have any questions or concerns. The clinic phone number is [phone].  Please show the CHEMO ALERT CARD at check-in to the Emergency Department and triage nurse.   

## 2020-01-25 ENCOUNTER — Ambulatory Visit
Admission: RE | Admit: 2020-01-25 | Discharge: 2020-01-25 | Disposition: A | Discharge status: Home / Self Care | Payer: Self-pay | Source: Ambulatory Visit | Attending: Radiation Oncology | Admitting: Radiation Oncology

## 2020-01-25 ENCOUNTER — Other Ambulatory Visit: Payer: Self-pay

## 2020-01-26 ENCOUNTER — Ambulatory Visit
Admission: RE | Admit: 2020-01-26 | Discharge: 2020-01-26 | Disposition: A | Discharge status: Home / Self Care | Payer: Self-pay | Source: Ambulatory Visit | Attending: Radiation Oncology | Admitting: Radiation Oncology

## 2020-01-26 ENCOUNTER — Other Ambulatory Visit: Payer: Self-pay

## 2020-01-27 ENCOUNTER — Ambulatory Visit
Admission: RE | Admit: 2020-01-27 | Discharge: 2020-01-27 | Disposition: A | Discharge status: Home / Self Care | Payer: Self-pay | Source: Ambulatory Visit | Attending: Radiation Oncology | Admitting: Radiation Oncology

## 2020-01-27 ENCOUNTER — Other Ambulatory Visit: Payer: Self-pay

## 2020-01-27 DIAGNOSIS — C3411 Malignant neoplasm of upper lobe, right bronchus or lung: Secondary | ICD-10-CM

## 2020-01-27 MED ORDER — SONAFINE EX EMUL
1.0000 "application " | Freq: Once | CUTANEOUS | Status: AC
  Administered 2020-01-27: 1 via TOPICAL

## 2020-01-27 NOTE — Progress Notes (Signed)
Pt here for patient teaching.  Pt given Radiation and You booklet, skin care instructions, Radiaplex gel and Sonafine.  Reviewed areas of pertinence such as fatigue, hair loss, skin changes, throat changes, cough and shortness of breath . Pt able to give teach back of to pat skin and use unscented/gentle soap,apply Sonafine bid and avoid applying anything to skin within 4 hours of treatment. Pt verbalizes understanding of information given and will contact nursing with any questions or concerns.     Gloriajean Dell. Leonie Green, BSN

## 2020-01-30 ENCOUNTER — Other Ambulatory Visit: Payer: Self-pay

## 2020-01-30 ENCOUNTER — Ambulatory Visit
Admission: RE | Admit: 2020-01-30 | Discharge: 2020-01-30 | Disposition: A | Discharge status: Home / Self Care | Payer: Self-pay | Source: Ambulatory Visit | Attending: Radiation Oncology | Admitting: Radiation Oncology

## 2020-01-31 ENCOUNTER — Inpatient Hospital Stay (HOSPITAL_BASED_OUTPATIENT_CLINIC_OR_DEPARTMENT_OTHER): Payer: Self-pay | Admitting: Internal Medicine

## 2020-01-31 ENCOUNTER — Ambulatory Visit
Admission: RE | Admit: 2020-01-31 | Discharge: 2020-01-31 | Disposition: A | Discharge status: Home / Self Care | Payer: Self-pay | Source: Ambulatory Visit | Attending: Radiation Oncology | Admitting: Radiation Oncology

## 2020-01-31 ENCOUNTER — Other Ambulatory Visit: Payer: Self-pay

## 2020-01-31 ENCOUNTER — Encounter: Payer: Self-pay | Admitting: Internal Medicine

## 2020-01-31 ENCOUNTER — Inpatient Hospital Stay: Payer: Self-pay

## 2020-01-31 VITALS — BP 102/67 | HR 100 | Temp 98.2°F | Resp 17 | Ht 61.0 in | Wt 113.1 lb

## 2020-01-31 DIAGNOSIS — C3411 Malignant neoplasm of upper lobe, right bronchus or lung: Secondary | ICD-10-CM

## 2020-01-31 DIAGNOSIS — Z5111 Encounter for antineoplastic chemotherapy: Secondary | ICD-10-CM

## 2020-01-31 LAB — CBC WITH DIFFERENTIAL (CANCER CENTER ONLY)
Abs Immature Granulocytes: 0.01 10*3/uL (ref 0.00–0.07)
Basophils Absolute: 0 10*3/uL (ref 0.0–0.1)
Basophils Relative: 1 %
Eosinophils Absolute: 0.1 10*3/uL (ref 0.0–0.5)
Eosinophils Relative: 4 %
HCT: 33.2 % — ABNORMAL LOW (ref 39.0–52.0)
Hemoglobin: 10.4 g/dL — ABNORMAL LOW (ref 13.0–17.0)
Immature Granulocytes: 0 %
Lymphocytes Relative: 17 %
Lymphs Abs: 0.5 10*3/uL — ABNORMAL LOW (ref 0.7–4.0)
MCH: 22 pg — ABNORMAL LOW (ref 26.0–34.0)
MCHC: 31.3 g/dL (ref 30.0–36.0)
MCV: 70.2 fL — ABNORMAL LOW (ref 80.0–100.0)
Monocytes Absolute: 0.4 10*3/uL (ref 0.1–1.0)
Monocytes Relative: 12 %
Neutro Abs: 2 10*3/uL (ref 1.7–7.7)
Neutrophils Relative %: 66 %
Platelet Count: 291 10*3/uL (ref 150–400)
RBC: 4.73 MIL/uL (ref 4.22–5.81)
RDW: 16.2 % — ABNORMAL HIGH (ref 11.5–15.5)
WBC Count: 3.1 10*3/uL — ABNORMAL LOW (ref 4.0–10.5)
nRBC: 0 % (ref 0.0–0.2)

## 2020-01-31 LAB — CMP (CANCER CENTER ONLY)
ALT: 11 U/L (ref 0–44)
AST: 14 U/L — ABNORMAL LOW (ref 15–41)
Albumin: 3.3 g/dL — ABNORMAL LOW (ref 3.5–5.0)
Alkaline Phosphatase: 58 U/L (ref 38–126)
Anion gap: 4 — ABNORMAL LOW (ref 5–15)
BUN: 14 mg/dL (ref 8–23)
CO2: 31 mmol/L (ref 22–32)
Calcium: 9.5 mg/dL (ref 8.9–10.3)
Chloride: 100 mmol/L (ref 98–111)
Creatinine: 0.67 mg/dL (ref 0.61–1.24)
GFR, Est AFR Am: >60 mL/min (ref >60)
GFR, Estimated: >60 mL/min (ref >60)
Glucose, Bld: 82 mg/dL (ref 70–99)
Potassium: 4.1 mmol/L (ref 3.5–5.1)
Sodium: 135 mmol/L (ref 135–145)
Total Bilirubin: <0.2 mg/dL — ABNORMAL LOW (ref 0.3–1.2)
Total Protein: 7.4 g/dL (ref 6.5–8.1)

## 2020-01-31 LAB — GUARDANT 360

## 2020-01-31 MED ORDER — PALONOSETRON HCL INJECTION 0.25 MG/5ML
0.2500 mg | Freq: Once | INTRAVENOUS | Status: AC
  Administered 2020-01-31: 0.25 mg via INTRAVENOUS

## 2020-01-31 MED ORDER — SODIUM CHLORIDE 0.9 % IV SOLN
Freq: Once | INTRAVENOUS | Status: AC
  Filled 2020-01-31: qty 250

## 2020-01-31 MED ORDER — SODIUM CHLORIDE 0.9 % IV SOLN
20.0000 mg | Freq: Once | INTRAVENOUS | Status: AC
  Administered 2020-01-31: 20 mg via INTRAVENOUS
  Filled 2020-01-31: qty 20

## 2020-01-31 MED ORDER — PALONOSETRON HCL INJECTION 0.25 MG/5ML
INTRAVENOUS | Status: AC
  Filled 2020-01-31: qty 5

## 2020-01-31 MED ORDER — DIPHENHYDRAMINE HCL 50 MG/ML IJ SOLN
50.0000 mg | Freq: Once | INTRAMUSCULAR | Status: AC
  Administered 2020-01-31: 50 mg via INTRAVENOUS

## 2020-01-31 MED ORDER — FAMOTIDINE IN NACL 20-0.9 MG/50ML-% IV SOLN
INTRAVENOUS | Status: AC
  Filled 2020-01-31: qty 50

## 2020-01-31 MED ORDER — FAMOTIDINE IN NACL 20-0.9 MG/50ML-% IV SOLN
20.0000 mg | Freq: Once | INTRAVENOUS | Status: AC
  Administered 2020-01-31: 20 mg via INTRAVENOUS

## 2020-01-31 MED ORDER — SODIUM CHLORIDE 0.9 % IV SOLN
45.0000 mg/m2 | Freq: Once | INTRAVENOUS | Status: AC
  Administered 2020-01-31: 66 mg via INTRAVENOUS
  Filled 2020-01-31: qty 11

## 2020-01-31 MED ORDER — DIPHENHYDRAMINE HCL 50 MG/ML IJ SOLN
INTRAMUSCULAR | Status: AC
  Filled 2020-01-31: qty 1

## 2020-01-31 MED ORDER — SODIUM CHLORIDE 0.9 % IV SOLN
189.8000 mg | Freq: Once | INTRAVENOUS | Status: AC
  Administered 2020-01-31: 190 mg via INTRAVENOUS
  Filled 2020-01-31: qty 19

## 2020-01-31 NOTE — Patient Instructions (Signed)
Steps to Quit Smoking Smoking tobacco is the leading cause of preventable death. It can affect almost every organ in the body. Smoking puts you and people around you at risk for many serious, long-lasting (chronic) diseases. Quitting smoking can be hard, but it is one of the best things that you can do for your health. It is never too late to quit. How do I get ready to quit? When you decide to quit smoking, make a plan to help you succeed. Before you quit:  Pick a date to quit. Set a date within the next 2 weeks to give you time to prepare.  Write down the reasons why you are quitting. Keep this list in places where you will see it often.  Tell your family, friends, and co-workers that you are quitting. Their support is important.  Talk with your doctor about the choices that may help you quit.  Find out if your health insurance will pay for these treatments.  Know the people, places, things, and activities that make you want to smoke (triggers). Avoid them. What first steps can I take to quit smoking?  Throw away all cigarettes at home, at work, and in your car.  Throw away the things that you use when you smoke, such as ashtrays and lighters.  Clean your car. Make sure to empty the ashtray.  Clean your home, including curtains and carpets. What can I do to help me quit smoking? Talk with your doctor about taking medicines and seeing a counselor at the same time. You are more likely to succeed when you do both.  If you are pregnant or breastfeeding, talk with your doctor about counseling or other ways to quit smoking. Do not take medicine to help you quit smoking unless your doctor tells you to do so. To quit smoking: Quit right away  Quit smoking totally, instead of slowly cutting back on how much you smoke over a period of time.  Go to counseling. You are more likely to quit if you go to counseling sessions regularly. Take medicine You may take medicines to help you quit. Some  medicines need a prescription, and some you can buy over-the-counter. Some medicines may contain a drug called nicotine to replace the nicotine in cigarettes. Medicines may:  Help you to stop having the desire to smoke (cravings).  Help to stop the problems that come when you stop smoking (withdrawal symptoms). Your doctor may ask you to use:  Nicotine patches, gum, or lozenges.  Nicotine inhalers or sprays.  Non-nicotine medicine that is taken by mouth. Find resources Find resources and other ways to help you quit smoking and remain smoke-free after you quit. These resources are most helpful when you use them often. They include:  Online chats with a counselor.  Phone quitlines.  Printed self-help materials.  Support groups or group counseling.  Text messaging programs.  Mobile phone apps. Use apps on your mobile phone or tablet that can help you stick to your quit plan. There are many free apps for mobile phones and tablets as well as websites. Examples include Quit Guide from the CDC and smokefree.gov  What things can I do to make it easier to quit?   Talk to your family and friends. Ask them to support and encourage you.  Call a phone quitline (1-800-QUIT-NOW), reach out to support groups, or work with a counselor.  Ask people who smoke to not smoke around you.  Avoid places that make you want to smoke,   such as: ? Bars. ? Parties. ? Smoke-break areas at work.  Spend time with people who do not smoke.  Lower the stress in your life. Stress can make you want to smoke. Try these things to help your stress: ? Getting regular exercise. ? Doing deep-breathing exercises. ? Doing yoga. ? Meditating. ? Doing a body scan. To do this, close your eyes, focus on one area of your body at a time from head to toe. Notice which parts of your body are tense. Try to relax the muscles in those areas. How will I feel when I quit smoking? Day 1 to 3 weeks Within the first 24 hours,  you may start to have some problems that come from quitting tobacco. These problems are very bad 2-3 days after you quit, but they do not often last for more than 2-3 weeks. You may get these symptoms:  Mood swings.  Feeling restless, nervous, angry, or annoyed.  Trouble concentrating.  Dizziness.  Strong desire for high-sugar foods and nicotine.  Weight gain.  Trouble pooping (constipation).  Feeling like you may vomit (nausea).  Coughing or a sore throat.  Changes in how the medicines that you take for other issues work in your body.  Depression.  Trouble sleeping (insomnia). Week 3 and afterward After the first 2-3 weeks of quitting, you may start to notice more positive results, such as:  Better sense of smell and taste.  Less coughing and sore throat.  Slower heart rate.  Lower blood pressure.  Clearer skin.  Better breathing.  Fewer sick days. Quitting smoking can be hard. Do not give up if you fail the first time. Some people need to try a few times before they succeed. Do your best to stick to your quit plan, and talk with your doctor if you have any questions or concerns. Summary  Smoking tobacco is the leading cause of preventable death. Quitting smoking can be hard, but it is one of the best things that you can do for your health.  When you decide to quit smoking, make a plan to help you succeed.  Quit smoking right away, not slowly over a period of time.  When you start quitting, seek help from your doctor, family, or friends. This information is not intended to replace advice given to you by your health care provider. Make sure you discuss any questions you have with your health care provider. Document Revised: 01/28/2019 Document Reviewed: 07/24/2018 Elsevier Patient Education  2020 Elsevier Inc.  

## 2020-01-31 NOTE — Progress Notes (Signed)
[place] [phone]   [phone]  OFFICE PROGRESS NOTE  [name], NP [address] [address]  DIAGNOSIS: Stage IIIA (Tx, N2, M0) non-small cell lung cancer, adenocarcinoma diagnosed in August 2021 and presented with large right paratracheal soft tissue mass extending from the right apex to the right suprahilar and precarinal region.  Molecular studies by [employer]:  [id number], 31.9%, Olaparib  [id number], 68.6%. None   BRAFAmplification, High (+++) Plasma Copy Number: 5.0  PRIOR THERAPY: None.  CURRENT THERAPY: Concurrent chemoradiation with weekly carboplatin for AUC of 2 and paclitaxel 45 mg/M2.  First dose January 17, 2020.  Status post 2 cycles.  INTERVAL HISTORY: [name] [name] 64 y.o. male returns to the clinic today for follow-up visit. The patient is feeling fine today with no concerning complaints except for the persistent hoarseness of his voice. He gained around 4 pounds since his last visit. He denied having any dysphagia or odynophagia. He has no nausea, vomiting, diarrhea or constipation. The patient denied having any chest pain, shortness of breath, cough or hemoptysis. He has no headache or visual changes. He continues to tolerate the course of concurrent chemoradiation fairly well. Is here today for evaluation before starting cycle #3.   MEDICAL HISTORY: Past Medical History:  Diagnosis Date  . 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[place]) 12/25/2019  . COPD (chronic obstructive pulmonary disease) ([place]) 11/02/2007   Qualifier: Diagnosis of  By: Melvyn Novas MD, Christena Deem   . Hypertension 12/24/2019  . Iron deficiency anemia 08/23/2007   Qualifier: Diagnosis of  By: Jim Like      ALLERGIES:  has No Known Allergies.  MEDICATIONS:  Current Outpatient Medications  Medication Sig Dispense Refill  . acetaminophen (TYLENOL) 500 MG tablet Take 500 mg by mouth every 6 (six) hours as needed for mild pain or moderate  pain.    Marland Kitchen aspirin 81 MG EC tablet Take 1 tablet (81 mg total) by mouth daily. 30 tablet 11  . budesonide-formoterol (SYMBICORT) 80-4.5 MCG/ACT inhaler Inhale 2 puffs into the lungs 2 (two) times daily. (Patient taking differently: Inhale 2 puffs into the lungs 2 (two) times daily as needed (wheezing, SOB). ) 1 Inhaler 11  . feeding supplement, ENSURE ENLIVE, (ENSURE ENLIVE) LIQD Take 237 mLs by mouth 2 (two) times daily between meals. 237 mL 12  . Multiple Vitamin (MULTIVITAMIN) tablet Take 1 tablet by mouth daily.    . prochlorperazine (COMPAZINE) 10 MG tablet Take 1 tablet (10 mg total) by mouth every 6 (six) hours as needed for nausea or vomiting. 30 tablet 0  . senna-docusate (SENOKOT-S) 8.6-50 MG tablet Take 1 tablet by mouth at bedtime.    Marland Kitchen SPIRIVA HANDIHALER 18 MCG inhalation capsule 1 capsule daily.    . VENTOLIN HFA 108 (90 Base) MCG/ACT inhaler Inhale 2 puffs into the lungs every 4 (four) hours as needed.     No current facility-administered medications for this visit.    SURGICAL HISTORY:  Past Surgical History:  Procedure Laterality Date  . ENDOBRONCHIAL ULTRASOUND N/A 12/27/2019   Procedure: ENDOBRONCHIAL ULTRASOUND;  Surgeon: Laurin Coder, MD;  Location: [facility];  Service: Endoscopy;  Laterality: N/A;  . FINE NEEDLE ASPIRATION  12/27/2019   Procedure: FINE NEEDLE ASPIRATION;  Surgeon: Laurin Coder, MD;  Location: [facility];  Service: Endoscopy;;  . VIDEO BRONCHOSCOPY N/A 12/27/2019   Procedure: VIDEO BRONCHOSCOPY WITHOUT FLUORO;  Surgeon: Laurin Coder, MD;  Location: [facility];  Service: Endoscopy;  Laterality:  N/A;    REVIEW OF SYSTEMS:  A comprehensive review of systems was negative except for: Ears, nose, mouth, throat, and face: positive for hoarseness   PHYSICAL EXAMINATION: General appearance: alert, cooperative, fatigued and no distress Head: Normocephalic, without obvious abnormality, atraumatic Neck: no adenopathy, no JVD, supple,  symmetrical, trachea midline and thyroid not enlarged, symmetric, no tenderness/mass/nodules Lymph nodes: Cervical, supraclavicular, and axillary nodes normal. Resp: clear to auscultation bilaterally Back: symmetric, no curvature. ROM normal. No CVA tenderness. Cardio: regular rate and rhythm, S1, S2 normal, no murmur, click, rub or gallop GI: soft, non-tender; bowel sounds normal; no masses,  no organomegaly Extremities: extremities normal, atraumatic, no cyanosis or edema  ECOG PERFORMANCE STATUS: 1 - Symptomatic but completely ambulatory  Blood pressure 102/67, pulse 100, temperature 98.2 F (36.8 C), temperature source Tympanic, resp. rate 17, height 5\' 1"  (1.549 m), weight 113 lb 1.6 oz (51.3 kg), SpO2 99 %.  LABORATORY DATA: Lab Results  Component Value Date   WBC 3.1 (L) 01/31/2020   HGB 10.4 (L) 01/31/2020   HCT 33.2 (L) 01/31/2020   MCV 70.2 (L) 01/31/2020   PLT 291 01/31/2020      Chemistry      Component Value Date/Time   NA 133 (L) 01/24/2020 1053   NA 144 08/03/2019 1527   K 3.8 01/24/2020 1053   CL 98 01/24/2020 1053   CO2 26 01/24/2020 1053   BUN 17 01/24/2020 1053   BUN 9 08/03/2019 1527   CREATININE 0.81 01/24/2020 1053      Component Value Date/Time   CALCIUM 9.8 01/24/2020 1053   ALKPHOS 58 01/24/2020 1053   AST 16 01/24/2020 1053   ALT 12 01/24/2020 1053   BILITOT 0.3 01/24/2020 1053       RADIOGRAPHIC STUDIES: NM PET Image Initial (PI) Skull Base To Thigh  Result Date: 01/11/2020 CLINICAL DATA:  Initial treatment strategy for new diagnosis of non-small cell lung cancer. Biopsy on 12/27/2019. No COVID vaccine. EXAM: NUCLEAR MEDICINE PET SKULL BASE TO THIGH TECHNIQUE: 6.7 mCi F-18 FDG was injected intravenously. Full-ring PET imaging was performed from the skull base to thigh after the radiotracer. CT data was obtained and used for attenuation correction and anatomic localization. Fasting blood glucose: 78 mg/dl COMPARISON:  Chest abdomen and pelvic  CTs of 12/24/2019. Chest radiograph 12/24/2019. FINDINGS: Mediastinal blood pool activity: SUV max 2.0 Liver activity: SUV max NA NECK: Low cervical activity is felt to be a combination of muscular and possibly minimal hypermetabolic "brown" fat. No convincing evidence of cervical nodal hypermetabolism. Incidental CT findings: No cervical adenopathy. Right frontal craniotomy for aneurysm clipping. CHEST: Dominant right paratracheal nodal mass measures 6.8 by 7.0 cm and a S.U.V. max of 18.1 on 53/4. Central necrosis identified within. Adjacent, likely separate low paratracheal node of 2.1 cm and a S.U.V. max of 11.063/4. Right hilar hypermetabolism corresponding to borderline adenopathy on prior diagnostic CT. Example at a S.U.V. max of 4.8 on 72/4. A subcarinal node measures 12 mm and a S.U.V. max of 3.3 on 69/4. Low-level hypermetabolism corresponding to a portion of the left upper lobe volume loss and cystic bronchiectasis. Example at a S.U.V. max of 1.7 on 19/8. The adjacent lingular nodules described on CT are without hypermetabolic correlate on 13/0. Incidental CT findings: Emphysema.  Mild mediastinal shift left. ABDOMEN/PELVIS: No abdominopelvic parenchymal or nodal hypermetabolism. Hypermetabolism about the distal penis is presumably due to urinary contamination. Incidental CT findings: Normal adrenal glands. Abdominal aortic atherosclerosis. Mild prostatomegaly. SKELETON: No abnormal marrow  activity. Incidental CT findings: Probable bone island in the right iliac. Bilateral L5 pars defects with grade 1 L5-S1 anterolisthesis. IMPRESSION: 1. Marked hypermetabolic mediastinal nodal metastasis. No pulmonary parenchymal primary identified. Low-level hypermetabolism within a portion of an area of presumed post infectious/inflammatory left upper lobe scarring is favored to be benign. 2. Equivocal right hilar node, suspicious based on location. 3. No hypermetabolic extrathoracic metastatic disease identified. 4.  Incidental findings, including: Coronary artery atherosclerosis. Aortic Atherosclerosis (ICD10-I70.0). Electronically Signed   By: Abigail Miyamoto M.D.   On: 01/11/2020 17:04    ASSESSMENT AND PLAN: This is a very pleasant 64 years old African-American male recently diagnosed with stage IIIA (Tx, N2, M0) non-small cell lung cancer, adenocarcinoma presented with large right paratracheal soft tissue mass extending from the right apex to the right suprahilar and precarinal region diagnosed in August 2021. Molecular studies by [employer] showed no actionable mutations. The patient is currently undergoing a course of concurrent chemoradiation with weekly carboplatin for AUC of 2 and paclitaxel 45 mg/M2.  He is status post 2 cycles. The patient continues to tolerate his treatment well with no concerning adverse effects. I recommended for him to proceed with cycle #3 today as planned. He will come back for follow-up visit in 2 weeks for evaluation before starting cycle #5. The patient was advised to call immediately if he has any concerning symptoms in the interval. The patient voices understanding of current disease status and treatment options and is in agreement with the current care plan.  All questions were answered. The patient knows to call the clinic with any problems, questions or concerns. We can certainly see the patient much sooner if necessary.  The total time spent in the appointment was 30 minutes.  Disclaimer: This note was dictated with voice recognition software. Similar sounding words can inadvertently be transcribed and may not be corrected upon review.

## 2020-01-31 NOTE — Patient Instructions (Signed)
[facility] Discharge Instructions for Patients Receiving Chemotherapy  Today you received the following chemotherapy agents Paclitaxel (TAXOL) & Carboplatin (PARAPLATIN).  To help prevent nausea and vomiting after your treatment, we encourage you to take your nausea medication as prescribed.  If you develop nausea and vomiting that is not controlled by your nausea medication, call the clinic.   BELOW ARE SYMPTOMS THAT SHOULD BE REPORTED IMMEDIATELY:  *FEVER GREATER THAN 100.5 F  *CHILLS WITH OR WITHOUT FEVER  NAUSEA AND VOMITING THAT IS NOT CONTROLLED WITH YOUR NAUSEA MEDICATION  *UNUSUAL SHORTNESS OF BREATH  *UNUSUAL BRUISING OR BLEEDING  TENDERNESS IN MOUTH AND THROAT WITH OR WITHOUT PRESENCE OF ULCERS  *URINARY PROBLEMS  *BOWEL PROBLEMS  UNUSUAL RASH Items with * indicate a potential emergency and should be followed up as soon as possible.  Feel free to call the clinic should you have any questions or concerns. The clinic phone number is [phone].  Please show the CHEMO ALERT CARD at check-in to the Emergency Department and triage nurse.   

## 2020-02-01 ENCOUNTER — Other Ambulatory Visit: Payer: Self-pay

## 2020-02-01 ENCOUNTER — Ambulatory Visit
Admission: RE | Admit: 2020-02-01 | Discharge: 2020-02-01 | Disposition: A | Discharge status: Home / Self Care | Payer: Self-pay | Source: Ambulatory Visit | Attending: Radiation Oncology | Admitting: Radiation Oncology

## 2020-02-02 ENCOUNTER — Ambulatory Visit
Admission: RE | Admit: 2020-02-02 | Discharge: 2020-02-02 | Disposition: A | Discharge status: Home / Self Care | Payer: Self-pay | Source: Ambulatory Visit | Attending: Radiation Oncology | Admitting: Radiation Oncology

## 2020-02-03 ENCOUNTER — Ambulatory Visit
Admission: RE | Admit: 2020-02-03 | Discharge: 2020-02-03 | Disposition: A | Discharge status: Home / Self Care | Payer: Self-pay | Source: Ambulatory Visit | Attending: Radiation Oncology | Admitting: Radiation Oncology

## 2020-02-03 ENCOUNTER — Other Ambulatory Visit: Payer: Self-pay

## 2020-02-03 ENCOUNTER — Other Ambulatory Visit: Payer: Self-pay | Admitting: Radiation Oncology

## 2020-02-03 MED ORDER — SUCRALFATE 1 G PO TABS: 1.0000 g | ORAL_TABLET | Freq: Four times a day (QID) | ORAL | 2 refills | Status: DC

## 2020-02-03 MED FILL — SUCRALFATE 1 GM TABLET: 1 | 30 days supply | Qty: 120 | Fill #0

## 2020-02-06 ENCOUNTER — Other Ambulatory Visit: Payer: Self-pay

## 2020-02-06 ENCOUNTER — Ambulatory Visit
Admission: RE | Admit: 2020-02-06 | Discharge: 2020-02-06 | Disposition: A | Discharge status: Home / Self Care | Payer: Self-pay | Source: Ambulatory Visit | Attending: Radiation Oncology | Admitting: Radiation Oncology

## 2020-02-07 ENCOUNTER — Other Ambulatory Visit: Payer: Self-pay

## 2020-02-07 ENCOUNTER — Ambulatory Visit
Admission: RE | Admit: 2020-02-07 | Discharge: 2020-02-07 | Disposition: A | Discharge status: Home / Self Care | Payer: Self-pay | Source: Ambulatory Visit | Attending: Radiation Oncology | Admitting: Radiation Oncology

## 2020-02-07 ENCOUNTER — Inpatient Hospital Stay: Payer: Self-pay

## 2020-02-07 VITALS — BP 127/75 | HR 100 | Temp 98.8°F | Resp 18 | Ht 61.0 in | Wt 112.5 lb

## 2020-02-07 DIAGNOSIS — C3411 Malignant neoplasm of upper lobe, right bronchus or lung: Secondary | ICD-10-CM

## 2020-02-07 LAB — CBC WITH DIFFERENTIAL (CANCER CENTER ONLY)
Abs Immature Granulocytes: 0.01 10*3/uL (ref 0.00–0.07)
Basophils Absolute: 0 10*3/uL (ref 0.0–0.1)
Basophils Relative: 1 %
Eosinophils Absolute: 0.1 10*3/uL (ref 0.0–0.5)
Eosinophils Relative: 4 %
HCT: 32.5 % — ABNORMAL LOW (ref 39.0–52.0)
Hemoglobin: 10.2 g/dL — ABNORMAL LOW (ref 13.0–17.0)
Immature Granulocytes: 0 %
Lymphocytes Relative: 14 %
Lymphs Abs: 0.4 10*3/uL — ABNORMAL LOW (ref 0.7–4.0)
MCH: 22.2 pg — ABNORMAL LOW (ref 26.0–34.0)
MCHC: 31.4 g/dL (ref 30.0–36.0)
MCV: 70.8 fL — ABNORMAL LOW (ref 80.0–100.0)
Monocytes Absolute: 0.4 10*3/uL (ref 0.1–1.0)
Monocytes Relative: 13 %
Neutro Abs: 1.9 10*3/uL (ref 1.7–7.7)
Neutrophils Relative %: 68 %
Platelet Count: 233 10*3/uL (ref 150–400)
RBC: 4.59 MIL/uL (ref 4.22–5.81)
RDW: 17.9 % — ABNORMAL HIGH (ref 11.5–15.5)
WBC Count: 2.7 10*3/uL — ABNORMAL LOW (ref 4.0–10.5)
nRBC: 0 % (ref 0.0–0.2)

## 2020-02-07 LAB — CMP (CANCER CENTER ONLY)
ALT: 13 U/L (ref 0–44)
AST: 13 U/L — ABNORMAL LOW (ref 15–41)
Albumin: 3.3 g/dL — ABNORMAL LOW (ref 3.5–5.0)
Alkaline Phosphatase: 59 U/L (ref 38–126)
Anion gap: 7 (ref 5–15)
BUN: 9 mg/dL (ref 8–23)
CO2: 27 mmol/L (ref 22–32)
Calcium: 9.3 mg/dL (ref 8.9–10.3)
Chloride: 104 mmol/L (ref 98–111)
Creatinine: 0.66 mg/dL (ref 0.61–1.24)
GFR, Est AFR Am: >60 mL/min (ref >60)
GFR, Estimated: >60 mL/min (ref >60)
Glucose, Bld: 79 mg/dL (ref 70–99)
Potassium: 4.3 mmol/L (ref 3.5–5.1)
Sodium: 138 mmol/L (ref 135–145)
Total Bilirubin: 0.3 mg/dL (ref 0.3–1.2)
Total Protein: 7.1 g/dL (ref 6.5–8.1)

## 2020-02-07 MED ORDER — FAMOTIDINE IN NACL 20-0.9 MG/50ML-% IV SOLN
20.0000 mg | Freq: Once | INTRAVENOUS | Status: AC
  Administered 2020-02-07: 20 mg via INTRAVENOUS

## 2020-02-07 MED ORDER — PALONOSETRON HCL INJECTION 0.25 MG/5ML
0.2500 mg | Freq: Once | INTRAVENOUS | Status: AC
  Administered 2020-02-07: 0.25 mg via INTRAVENOUS

## 2020-02-07 MED ORDER — FAMOTIDINE IN NACL 20-0.9 MG/50ML-% IV SOLN
INTRAVENOUS | Status: AC
  Filled 2020-02-07: qty 50

## 2020-02-07 MED ORDER — DIPHENHYDRAMINE HCL 50 MG/ML IJ SOLN
INTRAMUSCULAR | Status: AC
  Filled 2020-02-07: qty 1

## 2020-02-07 MED ORDER — SODIUM CHLORIDE 0.9 % IV SOLN
189.8000 mg | Freq: Once | INTRAVENOUS | Status: AC
  Administered 2020-02-07: 190 mg via INTRAVENOUS
  Filled 2020-02-07: qty 19

## 2020-02-07 MED ORDER — SODIUM CHLORIDE 0.9 % IV SOLN
Freq: Once | INTRAVENOUS | Status: AC
  Filled 2020-02-07: qty 250

## 2020-02-07 MED ORDER — SODIUM CHLORIDE 0.9 % IV SOLN
20.0000 mg | Freq: Once | INTRAVENOUS | Status: AC
  Administered 2020-02-07: 20 mg via INTRAVENOUS
  Filled 2020-02-07: qty 20

## 2020-02-07 MED ORDER — PALONOSETRON HCL INJECTION 0.25 MG/5ML
INTRAVENOUS | Status: AC
  Filled 2020-02-07: qty 5

## 2020-02-07 MED ORDER — SODIUM CHLORIDE 0.9 % IV SOLN
45.0000 mg/m2 | Freq: Once | INTRAVENOUS | Status: AC
  Administered 2020-02-07: 66 mg via INTRAVENOUS
  Filled 2020-02-07: qty 11

## 2020-02-07 MED ORDER — DIPHENHYDRAMINE HCL 50 MG/ML IJ SOLN
50.0000 mg | Freq: Once | INTRAMUSCULAR | Status: AC
  Administered 2020-02-07: 50 mg via INTRAVENOUS

## 2020-02-07 NOTE — Patient Instructions (Signed)
[facility] Discharge Instructions for Patients Receiving Chemotherapy  Today you received the following chemotherapy agents Paclitaxel (TAXOL) & Carboplatin (PARAPLATIN).  To help prevent nausea and vomiting after your treatment, we encourage you to take your nausea medication as prescribed.  If you develop nausea and vomiting that is not controlled by your nausea medication, call the clinic.   BELOW ARE SYMPTOMS THAT SHOULD BE REPORTED IMMEDIATELY:  *FEVER GREATER THAN 100.5 F  *CHILLS WITH OR WITHOUT FEVER  NAUSEA AND VOMITING THAT IS NOT CONTROLLED WITH YOUR NAUSEA MEDICATION  *UNUSUAL SHORTNESS OF BREATH  *UNUSUAL BRUISING OR BLEEDING  TENDERNESS IN MOUTH AND THROAT WITH OR WITHOUT PRESENCE OF ULCERS  *URINARY PROBLEMS  *BOWEL PROBLEMS  UNUSUAL RASH Items with * indicate a potential emergency and should be followed up as soon as possible.  Feel free to call the clinic should you have any questions or concerns. The clinic phone number is [phone].  Please show the CHEMO ALERT CARD at check-in to the Emergency Department and triage nurse.   

## 2020-02-08 ENCOUNTER — Other Ambulatory Visit: Payer: Self-pay

## 2020-02-08 ENCOUNTER — Ambulatory Visit
Admission: RE | Admit: 2020-02-08 | Discharge: 2020-02-08 | Disposition: A | Discharge status: Home / Self Care | Payer: Self-pay | Source: Ambulatory Visit | Attending: Radiation Oncology | Admitting: Radiation Oncology

## 2020-02-09 ENCOUNTER — Ambulatory Visit
Admission: RE | Admit: 2020-02-09 | Discharge: 2020-02-09 | Disposition: A | Discharge status: Home / Self Care | Payer: Self-pay | Source: Ambulatory Visit | Attending: Radiation Oncology | Admitting: Radiation Oncology

## 2020-02-09 ENCOUNTER — Other Ambulatory Visit: Payer: Self-pay

## 2020-02-10 ENCOUNTER — Ambulatory Visit
Admission: RE | Admit: 2020-02-10 | Discharge: 2020-02-10 | Disposition: A | Discharge status: Home / Self Care | Payer: Self-pay | Source: Ambulatory Visit | Attending: Radiation Oncology | Admitting: Radiation Oncology

## 2020-02-10 ENCOUNTER — Other Ambulatory Visit: Payer: Self-pay

## 2020-02-13 ENCOUNTER — Ambulatory Visit
Admission: RE | Admit: 2020-02-13 | Discharge: 2020-02-13 | Disposition: A | Discharge status: Home / Self Care | Payer: Self-pay | Source: Ambulatory Visit | Attending: Radiation Oncology | Admitting: Radiation Oncology

## 2020-02-14 ENCOUNTER — Encounter: Payer: Self-pay | Admitting: Internal Medicine

## 2020-02-14 ENCOUNTER — Inpatient Hospital Stay: Payer: Self-pay

## 2020-02-14 ENCOUNTER — Inpatient Hospital Stay (HOSPITAL_BASED_OUTPATIENT_CLINIC_OR_DEPARTMENT_OTHER): Payer: Self-pay | Admitting: Internal Medicine

## 2020-02-14 ENCOUNTER — Other Ambulatory Visit: Payer: Self-pay

## 2020-02-14 ENCOUNTER — Ambulatory Visit
Admission: RE | Admit: 2020-02-14 | Discharge: 2020-02-14 | Disposition: A | Discharge status: Home / Self Care | Payer: Self-pay | Source: Ambulatory Visit | Attending: Radiation Oncology | Admitting: Radiation Oncology

## 2020-02-14 VITALS — BP 117/73 | HR 107 | Temp 97.7°F | Resp 17 | Ht 61.0 in | Wt 114.7 lb

## 2020-02-14 DIAGNOSIS — Z5111 Encounter for antineoplastic chemotherapy: Secondary | ICD-10-CM

## 2020-02-14 DIAGNOSIS — C3411 Malignant neoplasm of upper lobe, right bronchus or lung: Secondary | ICD-10-CM

## 2020-02-14 LAB — CMP (CANCER CENTER ONLY)
ALT: 12 U/L (ref 0–44)
AST: 12 U/L — ABNORMAL LOW (ref 15–41)
Albumin: 3.2 g/dL — ABNORMAL LOW (ref 3.5–5.0)
Alkaline Phosphatase: 57 U/L (ref 38–126)
Anion gap: 7 (ref 5–15)
BUN: 7 mg/dL — ABNORMAL LOW (ref 8–23)
CO2: 26 mmol/L (ref 22–32)
Calcium: 9.2 mg/dL (ref 8.9–10.3)
Chloride: 104 mmol/L (ref 98–111)
Creatinine: 0.68 mg/dL (ref 0.61–1.24)
GFR, Est AFR Am: >60 mL/min (ref >60)
GFR, Estimated: >60 mL/min (ref >60)
Glucose, Bld: 78 mg/dL (ref 70–99)
Potassium: 4.3 mmol/L (ref 3.5–5.1)
Sodium: 137 mmol/L (ref 135–145)
Total Bilirubin: 0.3 mg/dL (ref 0.3–1.2)
Total Protein: 7.1 g/dL (ref 6.5–8.1)

## 2020-02-14 LAB — CBC WITH DIFFERENTIAL (CANCER CENTER ONLY)
Abs Immature Granulocytes: 0.03 10*3/uL (ref 0.00–0.07)
Basophils Absolute: 0 10*3/uL (ref 0.0–0.1)
Basophils Relative: 1 %
Eosinophils Absolute: 0.1 10*3/uL (ref 0.0–0.5)
Eosinophils Relative: 3 %
HCT: 33.5 % — ABNORMAL LOW (ref 39.0–52.0)
Hemoglobin: 10.4 g/dL — ABNORMAL LOW (ref 13.0–17.0)
Immature Granulocytes: 1 %
Lymphocytes Relative: 8 %
Lymphs Abs: 0.3 10*3/uL — ABNORMAL LOW (ref 0.7–4.0)
MCH: 21.7 pg — ABNORMAL LOW (ref 26.0–34.0)
MCHC: 31 g/dL (ref 30.0–36.0)
MCV: 69.9 fL — ABNORMAL LOW (ref 80.0–100.0)
Monocytes Absolute: 0.4 10*3/uL (ref 0.1–1.0)
Monocytes Relative: 12 %
Neutro Abs: 2.4 10*3/uL (ref 1.7–7.7)
Neutrophils Relative %: 75 %
Platelet Count: 189 10*3/uL (ref 150–400)
RBC: 4.79 MIL/uL (ref 4.22–5.81)
RDW: 20 % — ABNORMAL HIGH (ref 11.5–15.5)
WBC Count: 3.2 10*3/uL — ABNORMAL LOW (ref 4.0–10.5)
nRBC: 0 % (ref 0.0–0.2)

## 2020-02-14 MED ORDER — PALONOSETRON HCL INJECTION 0.25 MG/5ML
0.2500 mg | Freq: Once | INTRAVENOUS | Status: AC
  Administered 2020-02-14: 0.25 mg via INTRAVENOUS

## 2020-02-14 MED ORDER — DIPHENHYDRAMINE HCL 50 MG/ML IJ SOLN
50.0000 mg | Freq: Once | INTRAMUSCULAR | Status: AC
  Administered 2020-02-14: 50 mg via INTRAVENOUS

## 2020-02-14 MED ORDER — SODIUM CHLORIDE 0.9 % IV SOLN
Freq: Once | INTRAVENOUS | Status: AC
  Filled 2020-02-14: qty 250

## 2020-02-14 MED ORDER — SODIUM CHLORIDE 0.9 % IV SOLN
189.8000 mg | Freq: Once | INTRAVENOUS | Status: AC
  Administered 2020-02-14: 190 mg via INTRAVENOUS
  Filled 2020-02-14: qty 19

## 2020-02-14 MED ORDER — FAMOTIDINE IN NACL 20-0.9 MG/50ML-% IV SOLN
20.0000 mg | Freq: Once | INTRAVENOUS | Status: AC
  Administered 2020-02-14: 20 mg via INTRAVENOUS

## 2020-02-14 MED ORDER — FAMOTIDINE IN NACL 20-0.9 MG/50ML-% IV SOLN
INTRAVENOUS | Status: AC
  Filled 2020-02-14: qty 50

## 2020-02-14 MED ORDER — SODIUM CHLORIDE 0.9 % IV SOLN
20.0000 mg | Freq: Once | INTRAVENOUS | Status: AC
  Administered 2020-02-14: 20 mg via INTRAVENOUS
  Filled 2020-02-14: qty 20

## 2020-02-14 MED ORDER — PALONOSETRON HCL INJECTION 0.25 MG/5ML
INTRAVENOUS | Status: AC
  Filled 2020-02-14: qty 5

## 2020-02-14 MED ORDER — DIPHENHYDRAMINE HCL 50 MG/ML IJ SOLN
INTRAMUSCULAR | Status: AC
  Filled 2020-02-14: qty 1

## 2020-02-14 MED ORDER — SODIUM CHLORIDE 0.9 % IV SOLN
45.0000 mg/m2 | Freq: Once | INTRAVENOUS | Status: AC
  Administered 2020-02-14: 66 mg via INTRAVENOUS
  Filled 2020-02-14: qty 11

## 2020-02-14 NOTE — Progress Notes (Signed)
OK to treat despite elevated HR per Dr Julien Nordmann.

## 2020-02-14 NOTE — Patient Instructions (Signed)
[facility] Discharge Instructions for Patients Receiving Chemotherapy  Today you received the following chemotherapy agents: taxol, carboplatin   To help prevent nausea and vomiting after your treatment, we encourage you to take your nausea medication as directed.    If you develop nausea and vomiting that is not controlled by your nausea medication, call the clinic.   BELOW ARE SYMPTOMS THAT SHOULD BE REPORTED IMMEDIATELY:  *FEVER GREATER THAN 100.5 F  *CHILLS WITH OR WITHOUT FEVER  NAUSEA AND VOMITING THAT IS NOT CONTROLLED WITH YOUR NAUSEA MEDICATION  *UNUSUAL SHORTNESS OF BREATH  *UNUSUAL BRUISING OR BLEEDING  TENDERNESS IN MOUTH AND THROAT WITH OR WITHOUT PRESENCE OF ULCERS  *URINARY PROBLEMS  *BOWEL PROBLEMS  UNUSUAL RASH Items with * indicate a potential emergency and should be followed up as soon as possible.  Feel free to call the clinic should you have any questions or concerns. The clinic phone number is [phone].  Please show the [place] at check-in to the Emergency Department and triage nurse.

## 2020-02-14 NOTE — Progress Notes (Signed)
[name] Telephone:(336) [phone]   [phone]  OFFICE PROGRESS NOTE  [name], NP [address] [address]  DIAGNOSIS: Stage IIIA (Tx, N2, M0) non-small cell lung cancer, adenocarcinoma diagnosed in August 2021 and presented with large right paratracheal soft tissue mass extending from the right apex to the right suprahilar and precarinal region.  Molecular studies by [employer]:  [id number], 31.9%, Olaparib  [id number], 68.6%. None   BRAFAmplification, High (+++) Plasma Copy Number: 5.0  PRIOR THERAPY: None.  CURRENT THERAPY: Concurrent chemoradiation with weekly carboplatin for AUC of 2 and paclitaxel 45 mg/M2.  First dose January 17, 2020.  Status post 4 cycles.  INTERVAL HISTORY: [name] [name] 64 y.o. male returns to the clinic today for follow-up visit.  The patient is feeling fine today with no concerning complaints.  He is tolerating his concurrent chemoradiation fairly well.  He denied having any dysphagia or odynophagia.  He denied having any nausea, vomiting, diarrhea or constipation.  He has no chest pain, shortness of breath, cough or hemoptysis.  He has no headache or visual changes.  He is here today for evaluation before starting cycle #5 of his treatment.   MEDICAL HISTORY: Past Medical History:  Diagnosis Date  . 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[place]) 12/25/2019  . COPD (chronic obstructive pulmonary disease) ([place]) 11/02/2007   Qualifier: Diagnosis of  By: Melvyn Novas MD, Christena Deem   . Hypertension 12/24/2019  . Iron deficiency anemia 08/23/2007   Qualifier: Diagnosis of  By: Jim Like      ALLERGIES:  has No Known Allergies.  MEDICATIONS:  Current Outpatient Medications  Medication Sig Dispense Refill  . acetaminophen (TYLENOL) 500 MG tablet Take 500 mg by mouth every 6 (six) hours as needed for mild pain or moderate pain.    Marland Kitchen aspirin 81 MG EC tablet Take 1 tablet (81 mg total) by mouth daily. 30 tablet 11  .  budesonide-formoterol (SYMBICORT) 80-4.5 MCG/ACT inhaler Inhale 2 puffs into the lungs 2 (two) times daily. (Patient taking differently: Inhale 2 puffs into the lungs 2 (two) times daily as needed (wheezing, SOB). ) 1 Inhaler 11  . feeding supplement, ENSURE ENLIVE, (ENSURE ENLIVE) LIQD Take 237 mLs by mouth 2 (two) times daily between meals. 237 mL 12  . Multiple Vitamin (MULTIVITAMIN) tablet Take 1 tablet by mouth daily.    . prochlorperazine (COMPAZINE) 10 MG tablet Take 1 tablet (10 mg total) by mouth every 6 (six) hours as needed for nausea or vomiting. 30 tablet 0  . senna-docusate (SENOKOT-S) 8.6-50 MG tablet Take 1 tablet by mouth at bedtime.    Marland Kitchen SPIRIVA HANDIHALER 18 MCG inhalation capsule 1 capsule daily.    . sucralfate (CARAFATE) 1 g tablet Take 1 tablet (1 g total) by mouth 4 (four) times daily. Dissolve each tablet in 15 cc water before use. 120 tablet 2  . VENTOLIN HFA 108 (90 Base) MCG/ACT inhaler Inhale 2 puffs into the lungs every 4 (four) hours as needed.     No current facility-administered medications for this visit.    SURGICAL HISTORY:  Past Surgical History:  Procedure Laterality Date  . ENDOBRONCHIAL ULTRASOUND N/A 12/27/2019   Procedure: ENDOBRONCHIAL ULTRASOUND;  Surgeon: Laurin Coder, MD;  Location: [facility];  Service: Endoscopy;  Laterality: N/A;  . FINE NEEDLE ASPIRATION  12/27/2019   Procedure: FINE NEEDLE ASPIRATION;  Surgeon: Laurin Coder, MD;  Location: [facility];  Service: Endoscopy;;  . VIDEO BRONCHOSCOPY N/A 12/27/2019  Procedure: VIDEO BRONCHOSCOPY WITHOUT FLUORO;  Surgeon: Laurin Coder, MD;  Location: [facility];  Service: Endoscopy;  Laterality: N/A;    REVIEW OF SYSTEMS:  A comprehensive review of systems was negative except for: Constitutional: positive for fatigue   PHYSICAL EXAMINATION: General appearance: alert, cooperative, fatigued and no distress Head: Normocephalic, without obvious abnormality, atraumatic Neck: no  adenopathy, no JVD, supple, symmetrical, trachea midline and thyroid not enlarged, symmetric, no tenderness/mass/nodules Lymph nodes: Cervical, supraclavicular, and axillary nodes normal. Resp: clear to auscultation bilaterally Back: symmetric, no curvature. ROM normal. No CVA tenderness. Cardio: regular rate and rhythm, S1, S2 normal, no murmur, click, rub or gallop GI: soft, non-tender; bowel sounds normal; no masses,  no organomegaly Extremities: extremities normal, atraumatic, no cyanosis or edema  ECOG PERFORMANCE STATUS: 1 - Symptomatic but completely ambulatory  Blood pressure 117/73, pulse (!) 107, temperature 97.7 F (36.5 C), temperature source Tympanic, resp. rate 17, height 5\' 1"  (1.549 m), weight 114 lb 11.2 oz (52 kg), SpO2 99 %.  LABORATORY DATA: Lab Results  Component Value Date   WBC 3.2 (L) 02/14/2020   HGB 10.4 (L) 02/14/2020   HCT 33.5 (L) 02/14/2020   MCV 69.9 (L) 02/14/2020   PLT 189 02/14/2020      Chemistry      Component Value Date/Time   NA 137 02/14/2020 0922   NA 144 08/03/2019 1527   K 4.3 02/14/2020 0922   CL 104 02/14/2020 0922   CO2 26 02/14/2020 0922   BUN 7 (L) 02/14/2020 0922   BUN 9 08/03/2019 1527   CREATININE 0.68 02/14/2020 0922      Component Value Date/Time   CALCIUM 9.2 02/14/2020 0922   ALKPHOS 57 02/14/2020 0922   AST 12 (L) 02/14/2020 0922   ALT 12 02/14/2020 0922   BILITOT 0.3 02/14/2020 0922       RADIOGRAPHIC STUDIES: No results found.  ASSESSMENT AND PLAN: This is a very pleasant 64 years old African-American male recently diagnosed with stage IIIA (Tx, N2, M0) non-small cell lung cancer, adenocarcinoma presented with large right paratracheal soft tissue mass extending from the right apex to the right suprahilar and precarinal region diagnosed in August 2021. Molecular studies by [employer] showed no actionable mutations. The patient is currently undergoing a course of concurrent chemoradiation with weekly  carboplatin for AUC of 2 and paclitaxel 45 mg/M2.  He is status post 4 cycles. The patient continues to tolerate this treatment well with no concerning adverse effects. I recommended for him to proceed with cycle #5 today as planned. I will see him back for follow-up visit in 2 weeks for evaluation before starting cycle #7. The patient was advised to call immediately if he has any concerning symptoms in the interval. The patient voices understanding of current disease status and treatment options and is in agreement with the current care plan.  All questions were answered. The patient knows to call the clinic with any problems, questions or concerns. We can certainly see the patient much sooner if necessary.  Disclaimer: This note was dictated with voice recognition software. Similar sounding words can inadvertently be transcribed and may not be corrected upon review.

## 2020-02-15 ENCOUNTER — Ambulatory Visit
Admission: RE | Admit: 2020-02-15 | Discharge: 2020-02-15 | Disposition: A | Discharge status: Home / Self Care | Payer: Self-pay | Source: Ambulatory Visit | Attending: Radiation Oncology | Admitting: Radiation Oncology

## 2020-02-16 ENCOUNTER — Ambulatory Visit
Admission: RE | Admit: 2020-02-16 | Discharge: 2020-02-16 | Disposition: A | Discharge status: Home / Self Care | Payer: Self-pay | Source: Ambulatory Visit | Attending: Radiation Oncology | Admitting: Radiation Oncology

## 2020-02-16 ENCOUNTER — Other Ambulatory Visit: Payer: Self-pay

## 2020-02-17 ENCOUNTER — Ambulatory Visit
Admission: RE | Admit: 2020-02-17 | Discharge: 2020-02-17 | Disposition: A | Discharge status: Home / Self Care | Payer: Self-pay | Source: Ambulatory Visit | Attending: Radiation Oncology | Admitting: Radiation Oncology

## 2020-02-17 DIAGNOSIS — C3411 Malignant neoplasm of upper lobe, right bronchus or lung: Secondary | ICD-10-CM | POA: Insufficient documentation

## 2020-02-17 DIAGNOSIS — Z510 Encounter for antineoplastic radiation therapy: Secondary | ICD-10-CM | POA: Insufficient documentation

## 2020-02-20 ENCOUNTER — Ambulatory Visit
Admission: RE | Admit: 2020-02-20 | Discharge: 2020-02-20 | Disposition: A | Discharge status: Home / Self Care | Payer: Self-pay | Source: Ambulatory Visit | Attending: Radiation Oncology | Admitting: Radiation Oncology

## 2020-02-21 ENCOUNTER — Other Ambulatory Visit: Payer: Self-pay

## 2020-02-21 ENCOUNTER — Inpatient Hospital Stay: Payer: Self-pay

## 2020-02-21 ENCOUNTER — Ambulatory Visit
Admission: RE | Admit: 2020-02-21 | Discharge: 2020-02-21 | Disposition: A | Discharge status: Home / Self Care | Payer: Self-pay | Source: Ambulatory Visit | Attending: Radiation Oncology | Admitting: Radiation Oncology

## 2020-02-21 ENCOUNTER — Inpatient Hospital Stay: Payer: Self-pay | Attending: Internal Medicine

## 2020-02-21 VITALS — BP 105/66 | HR 104 | Temp 98.0°F | Resp 18

## 2020-02-21 DIAGNOSIS — C3411 Malignant neoplasm of upper lobe, right bronchus or lung: Secondary | ICD-10-CM

## 2020-02-21 DIAGNOSIS — C3491 Malignant neoplasm of unspecified part of right bronchus or lung: Secondary | ICD-10-CM | POA: Insufficient documentation

## 2020-02-21 DIAGNOSIS — Z5111 Encounter for antineoplastic chemotherapy: Secondary | ICD-10-CM | POA: Insufficient documentation

## 2020-02-21 LAB — CMP (CANCER CENTER ONLY)
ALT: 10 U/L (ref 0–44)
AST: 12 U/L — ABNORMAL LOW (ref 15–41)
Albumin: 3.2 g/dL — ABNORMAL LOW (ref 3.5–5.0)
Alkaline Phosphatase: 50 U/L (ref 38–126)
Anion gap: 4 — ABNORMAL LOW (ref 5–15)
BUN: 11 mg/dL (ref 8–23)
CO2: 28 mmol/L (ref 22–32)
Calcium: 9.3 mg/dL (ref 8.9–10.3)
Chloride: 104 mmol/L (ref 98–111)
Creatinine: 0.65 mg/dL (ref 0.61–1.24)
GFR, Estimated: >60 mL/min (ref >60)
Glucose, Bld: 85 mg/dL (ref 70–99)
Potassium: 3.9 mmol/L (ref 3.5–5.1)
Sodium: 136 mmol/L (ref 135–145)
Total Bilirubin: 0.2 mg/dL — ABNORMAL LOW (ref 0.3–1.2)
Total Protein: 6.7 g/dL (ref 6.5–8.1)

## 2020-02-21 LAB — CBC WITH DIFFERENTIAL (CANCER CENTER ONLY)
Abs Immature Granulocytes: 0.01 10*3/uL (ref 0.00–0.07)
Basophils Absolute: 0 10*3/uL (ref 0.0–0.1)
Basophils Relative: 0 %
Eosinophils Absolute: 0 10*3/uL (ref 0.0–0.5)
Eosinophils Relative: 2 %
HCT: 31.1 % — ABNORMAL LOW (ref 39.0–52.0)
Hemoglobin: 9.8 g/dL — ABNORMAL LOW (ref 13.0–17.0)
Immature Granulocytes: 0 %
Lymphocytes Relative: 12 %
Lymphs Abs: 0.3 10*3/uL — ABNORMAL LOW (ref 0.7–4.0)
MCH: 22.1 pg — ABNORMAL LOW (ref 26.0–34.0)
MCHC: 31.5 g/dL (ref 30.0–36.0)
MCV: 70 fL — ABNORMAL LOW (ref 80.0–100.0)
Monocytes Absolute: 0.4 10*3/uL (ref 0.1–1.0)
Monocytes Relative: 17 %
Neutro Abs: 1.5 10*3/uL — ABNORMAL LOW (ref 1.7–7.7)
Neutrophils Relative %: 69 %
Platelet Count: 173 10*3/uL (ref 150–400)
RBC: 4.44 MIL/uL (ref 4.22–5.81)
RDW: 21.3 % — ABNORMAL HIGH (ref 11.5–15.5)
WBC Count: 2.2 10*3/uL — ABNORMAL LOW (ref 4.0–10.5)
nRBC: 0 % (ref 0.0–0.2)

## 2020-02-21 MED ORDER — FAMOTIDINE IN NACL 20-0.9 MG/50ML-% IV SOLN
INTRAVENOUS | Status: AC
  Filled 2020-02-21: qty 50

## 2020-02-21 MED ORDER — DIPHENHYDRAMINE HCL 50 MG/ML IJ SOLN
50.0000 mg | Freq: Once | INTRAMUSCULAR | Status: AC
  Administered 2020-02-21: 50 mg via INTRAVENOUS

## 2020-02-21 MED ORDER — SODIUM CHLORIDE 0.9 % IV SOLN
20.0000 mg | Freq: Once | INTRAVENOUS | Status: AC
  Administered 2020-02-21: 20 mg via INTRAVENOUS
  Filled 2020-02-21: qty 20

## 2020-02-21 MED ORDER — SODIUM CHLORIDE 0.9 % IV SOLN
Freq: Once | INTRAVENOUS | Status: AC
  Filled 2020-02-21: qty 250

## 2020-02-21 MED ORDER — DIPHENHYDRAMINE HCL 50 MG/ML IJ SOLN
INTRAMUSCULAR | Status: AC
  Filled 2020-02-21: qty 1

## 2020-02-21 MED ORDER — PALONOSETRON HCL INJECTION 0.25 MG/5ML
0.2500 mg | Freq: Once | INTRAVENOUS | Status: AC
  Administered 2020-02-21: 0.25 mg via INTRAVENOUS

## 2020-02-21 MED ORDER — SODIUM CHLORIDE 0.9 % IV SOLN
190.0000 mg | Freq: Once | INTRAVENOUS | Status: AC
  Administered 2020-02-21: 190 mg via INTRAVENOUS
  Filled 2020-02-21: qty 19

## 2020-02-21 MED ORDER — FAMOTIDINE IN NACL 20-0.9 MG/50ML-% IV SOLN
20.0000 mg | Freq: Once | INTRAVENOUS | Status: AC
  Administered 2020-02-21: 20 mg via INTRAVENOUS

## 2020-02-21 MED ORDER — PALONOSETRON HCL INJECTION 0.25 MG/5ML
INTRAVENOUS | Status: AC
  Filled 2020-02-21: qty 5

## 2020-02-21 MED ORDER — SODIUM CHLORIDE 0.9 % IV SOLN
45.0000 mg/m2 | Freq: Once | INTRAVENOUS | Status: AC
  Administered 2020-02-21: 66 mg via INTRAVENOUS
  Filled 2020-02-21: qty 11

## 2020-02-21 NOTE — Progress Notes (Signed)
Per Dr. Julien Nordmann, ok to treat with HR of 104-105

## 2020-02-21 NOTE — Patient Instructions (Signed)
[facility] Discharge Instructions for Patients Receiving Chemotherapy  Today you received the following chemotherapy agents: taxol, carboplatin   To help prevent nausea and vomiting after your treatment, we encourage you to take your nausea medication as directed.    If you develop nausea and vomiting that is not controlled by your nausea medication, call the clinic.   BELOW ARE SYMPTOMS THAT SHOULD BE REPORTED IMMEDIATELY:  *FEVER GREATER THAN 100.5 F  *CHILLS WITH OR WITHOUT FEVER  NAUSEA AND VOMITING THAT IS NOT CONTROLLED WITH YOUR NAUSEA MEDICATION  *UNUSUAL SHORTNESS OF BREATH  *UNUSUAL BRUISING OR BLEEDING  TENDERNESS IN MOUTH AND THROAT WITH OR WITHOUT PRESENCE OF ULCERS  *URINARY PROBLEMS  *BOWEL PROBLEMS  UNUSUAL RASH Items with * indicate a potential emergency and should be followed up as soon as possible.  Feel free to call the clinic should you have any questions or concerns. The clinic phone number is [phone].  Please show the [place] at check-in to the Emergency Department and triage nurse.

## 2020-02-22 ENCOUNTER — Ambulatory Visit
Admission: RE | Admit: 2020-02-22 | Discharge: 2020-02-22 | Disposition: A | Discharge status: Home / Self Care | Payer: Self-pay | Source: Ambulatory Visit | Attending: Radiation Oncology | Admitting: Radiation Oncology

## 2020-02-22 ENCOUNTER — Other Ambulatory Visit: Payer: Self-pay

## 2020-02-23 ENCOUNTER — Ambulatory Visit
Admission: RE | Admit: 2020-02-23 | Discharge: 2020-02-23 | Disposition: A | Discharge status: Home / Self Care | Payer: Self-pay | Source: Ambulatory Visit | Attending: Radiation Oncology | Admitting: Radiation Oncology

## 2020-02-24 ENCOUNTER — Ambulatory Visit
Admission: RE | Admit: 2020-02-24 | Discharge: 2020-02-24 | Disposition: A | Discharge status: Home / Self Care | Payer: Self-pay | Source: Ambulatory Visit | Attending: Radiation Oncology | Admitting: Radiation Oncology

## 2020-02-24 NOTE — Progress Notes (Signed)
[place] OFFICE PROGRESS NOTE  [name], NP [address] [place] [address]  DIAGNOSIS: Stage IIIA (Tx, N2, M0) non-small cell lung cancer, adenocarcinoma diagnosed in August 2021 and presented with large right paratracheal soft tissue mass extending from the right apex to the right suprahilar and precarinal region.  Molecular studies by [employer]:  [id number], 31.9%, Olaparib  [id number], 68.6%. None       BRAFAmplification, High (+++) Plasma Copy Number: 5.0  PRIOR THERAPY: None  CURRENT THERAPY: Concurrent chemoradiation with weekly carboplatin for AUC of 2 and paclitaxel 45 mg/M2.  First dose January 17, 2020.  Status post 6 cycles.  INTERVAL HISTORY: [name] [name] 64 y.o. male returns to the clinic today for a follow-up visit.  The patient is feeling well today without any concerning complaints.  The patient is currently undergoing treatment with weekly concurrent chemoradiation with carbo taxol.  The patient's last dose of radiation is scheduled to be on 03/02/2020. He has a burn on his right shoulder from radiation which he is applying cream to. Today the patient denies any recent fever, chills, night sweats, or weight loss.  He states sometimes his chest feels sore and reports an occasional cough which is sometimes dry and sometimes productive. He does not use anything for cough. The patient denies any significant shortness of breath or hemoptysis.  He denies any nausea, vomiting, diarrhea, or constipation.  He denies any headache or visual changes.  The patient is here today for evaluation before starting cycle #7.  MEDICAL HISTORY: Past Medical History:  Diagnosis Date  . 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[place]) 12/25/2019  . COPD (chronic obstructive pulmonary disease) ([place]) 11/02/2007   Qualifier: Diagnosis of  By: Melvyn Novas MD, Christena Deem   . Hypertension 12/24/2019  . Iron deficiency anemia 08/23/2007   Qualifier: Diagnosis of  By: Jim Like       ALLERGIES:  has No Known Allergies.  MEDICATIONS:  Current Outpatient Medications  Medication Sig Dispense Refill  . acetaminophen (TYLENOL) 500 MG tablet Take 500 mg by mouth every 6 (six) hours as needed for mild pain or moderate pain.    Marland Kitchen aspirin 81 MG EC tablet Take 1 tablet (81 mg total) by mouth daily. 30 tablet 11  . budesonide-formoterol (SYMBICORT) 80-4.5 MCG/ACT inhaler Inhale 2 puffs into the lungs 2 (two) times daily. (Patient taking differently: Inhale 2 puffs into the lungs 2 (two) times daily as needed (wheezing, SOB). ) 1 Inhaler 11  . feeding supplement, ENSURE ENLIVE, (ENSURE ENLIVE) LIQD Take 237 mLs by mouth 2 (two) times daily between meals. 237 mL 12  . Multiple Vitamin (MULTIVITAMIN) tablet Take 1 tablet by mouth daily.    . prochlorperazine (COMPAZINE) 10 MG tablet Take 1 tablet (10 mg total) by mouth every 6 (six) hours as needed for nausea or vomiting. 30 tablet 0  . senna-docusate (SENOKOT-S) 8.6-50 MG tablet Take 1 tablet by mouth at bedtime.    Marland Kitchen SPIRIVA HANDIHALER 18 MCG inhalation capsule 1 capsule daily.    . sucralfate (CARAFATE) 1 g tablet Take 1 tablet (1 g total) by mouth 4 (four) times daily. Dissolve each tablet in 15 cc water before use. 120 tablet 2  . VENTOLIN HFA 108 (90 Base) MCG/ACT inhaler Inhale 2 puffs into the lungs every 4 (four) hours as needed.     No current facility-administered medications for this visit.    SURGICAL HISTORY:  Past Surgical History:  Procedure Laterality Date  . ENDOBRONCHIAL ULTRASOUND N/A 12/27/2019  Procedure: ENDOBRONCHIAL ULTRASOUND;  Surgeon: Laurin Coder, MD;  [facility];  Service: Endoscopy;  Laterality: N/A;  . FINE NEEDLE ASPIRATION  12/27/2019   Procedure: FINE NEEDLE ASPIRATION;  Surgeon: Laurin Coder, MD;  Location[facility];  Service: Endoscopy;;  . VIDEO BRONCHOSCOPY N/A 12/27/2019   Procedure: VIDEO BRONCHOSCOPY WITHOUT FLUORO;  Surgeon: Laurin Coder, MD;  Location:  [facility];  Service: Endoscopy;  Laterality: N/A;    REVIEW OF SYSTEMS:   Review of Systems  Constitutional: Negative for appetite change, chills, fatigue, fever and unexpected weight change.  HENT: Negative for mouth sores, nosebleeds, sore throat and trouble swallowing.   Eyes: Negative for eye problems and icterus.  Respiratory: Positive for cough. Negative for hemoptysis, shortness of breath and wheezing.   Cardiovascular: Positive for occasional chest soreness. Negative for chest pain and leg swelling.  Gastrointestinal: Negative for abdominal pain, constipation, diarrhea, nausea and vomiting.  Genitourinary: Negative for bladder incontinence, difficulty urinating, dysuria, frequency and hematuria.   Musculoskeletal: Negative for back pain, gait problem, neck pain and neck stiffness.  Skin: Negative for itching and rash.  Neurological: Negative for dizziness, extremity weakness, gait problem, headaches, light-headedness and seizures.  Hematological: Negative for adenopathy. Does not bruise/bleed easily.  Psychiatric/Behavioral: Negative for confusion, depression and sleep disturbance. The patient is not nervous/anxious.     PHYSICAL EXAMINATION:  Blood pressure 116/70, pulse (!) 110, temperature 97.8 F (36.6 C), temperature source Tympanic, resp. rate 20, height 5\' 1"  (1.549 m), weight 113 lb 6.4 oz (51.4 kg), SpO2 100 %.  ECOG PERFORMANCE STATUS: 1 - Symptomatic but completely ambulatory  Physical Exam  Constitutional: Oriented to person, place, and time and well-developed, well-nourished, and in no distress.  HENT:  Head: Normocephalic and atraumatic.  Mouth/Throat: Oropharynx is clear and moist. No oropharyngeal exudate.  Eyes: Conjunctivae are normal. Right eye exhibits no discharge. Left eye exhibits no discharge. No scleral icterus.  Neck: Normal range of motion. Neck supple.  Cardiovascular: Normal rate, regular rhythm, normal heart sounds and intact distal pulses.    Pulmonary/Chest: Effort normal. Quiet breath sounds in all lung fields. No respiratory distress. No wheezes. No rales.  Abdominal: Soft. Bowel sounds are normal. Exhibits no distension and no mass. There is no tenderness.  Musculoskeletal: Normal range of motion. Exhibits no edema.  Lymphadenopathy:    No cervical adenopathy.  Neurological: Alert and oriented to person, place, and time. Exhibits normal muscle tone. Gait normal. Coordination normal.  Skin: Radiation burn on right clavicular area. No signs of infection. Skin is warm and dry. No rash noted. Not diaphoretic. No erythema. No pallor.  Psychiatric: Mood, memory and judgment normal.  Vitals reviewed.  LABORATORY DATA: Lab Results  Component Value Date   WBC 1.6 (L) 02/28/2020   HGB 10.5 (L) 02/28/2020   HCT 33.7 (L) 02/28/2020   MCV 71.1 (L) 02/28/2020   PLT 211 02/28/2020      Chemistry      Component Value Date/Time   NA 142 02/28/2020 0922   NA 144 08/03/2019 1527   K 4.0 02/28/2020 0922   CL 105 02/28/2020 0922   CO2 27 02/28/2020 0922   BUN 14 02/28/2020 0922   BUN 9 08/03/2019 1527   CREATININE 0.71 02/28/2020 0922      Component Value Date/Time   CALCIUM 9.9 02/28/2020 0922   ALKPHOS 60 02/28/2020 0922   AST 11 (L) 02/28/2020 0922   ALT 11 02/28/2020 0922   BILITOT <0.2 (L) 02/28/2020 7017  RADIOGRAPHIC STUDIES:  No results found.   ASSESSMENT/PLAN:  This is a very pleasant 64 year old African-American male recently diagnosed with stage IIIA (Tx, N2, M0) non-small cell lung cancer, adenocarcinoma. He presented with large right paratracheal soft tissue mass extending from the right apex to the right suprahilar and precarinal region diagnosed in August 2021. Molecular studies by [employer] showed no actionable mutations. The patient is currently undergoing a course of concurrent chemoradiation with weekly carboplatin for AUC of 2 and paclitaxel 45 mg/M2.  He is status post 6 cycles. The  patient continues to tolerate this treatment well with no concerning adverse effects. Labs are reviewed. I also reviewed his labs with Dr. Julien Nordmann. His ANC is 1.0 today. Dr. Julien Nordmann recommend that he proceed with cycle #7 today as scheduled. Neutropenic precautions reviewed.   I will arrange for the patient to have a restaging CT scan performed of the chest in 3 to 4 weeks.  We will see the patient back for follow-up visit in 3 to 4 weeks for evaluation and to review his scan results and to have a more detailed discussion about his current condition and further treatment options.  Recommend that he use delsym for his cough if needed.   The patient was advised to call immediately if he has any concerning symptoms in the interval. The patient voices understanding of current disease status and treatment options and is in agreement with the current care plan.       Orders Placed This Encounter  Procedures  . CT Chest W Contrast    Standing Status:   Future    Standing Expiration Date:   02/27/2021    Order Specific Question:   If indicated for the ordered procedure, I authorize the administration of contrast media per Radiology protocol    Answer:   Yes    Order Specific Question:   Preferred imaging location?    Answer:   [place], PA-C 02/28/20

## 2020-02-27 ENCOUNTER — Ambulatory Visit
Admission: RE | Admit: 2020-02-27 | Discharge: 2020-02-27 | Disposition: A | Discharge status: Home / Self Care | Payer: Self-pay | Source: Ambulatory Visit | Attending: Radiation Oncology | Admitting: Radiation Oncology

## 2020-02-28 ENCOUNTER — Telehealth: Payer: Self-pay | Admitting: Internal Medicine

## 2020-02-28 ENCOUNTER — Other Ambulatory Visit: Payer: Self-pay | Admitting: Nurse Practitioner

## 2020-02-28 ENCOUNTER — Ambulatory Visit
Admission: RE | Admit: 2020-02-28 | Discharge: 2020-02-28 | Disposition: A | Discharge status: Home / Self Care | Payer: Self-pay | Source: Ambulatory Visit | Attending: Radiation Oncology | Admitting: Radiation Oncology

## 2020-02-28 ENCOUNTER — Ambulatory Visit: Payer: Self-pay | Admitting: Internal Medicine

## 2020-02-28 ENCOUNTER — Other Ambulatory Visit: Payer: Self-pay

## 2020-02-28 ENCOUNTER — Inpatient Hospital Stay: Payer: Self-pay

## 2020-02-28 ENCOUNTER — Inpatient Hospital Stay (HOSPITAL_BASED_OUTPATIENT_CLINIC_OR_DEPARTMENT_OTHER): Payer: Self-pay | Admitting: Physician Assistant

## 2020-02-28 VITALS — BP 116/70 | HR 110 | Temp 97.8°F | Resp 20 | Ht 61.0 in | Wt 113.4 lb

## 2020-02-28 VITALS — HR 104

## 2020-02-28 DIAGNOSIS — D701 Agranulocytosis secondary to cancer chemotherapy: Secondary | ICD-10-CM

## 2020-02-28 DIAGNOSIS — C3411 Malignant neoplasm of upper lobe, right bronchus or lung: Secondary | ICD-10-CM

## 2020-02-28 DIAGNOSIS — T451X5A Adverse effect of antineoplastic and immunosuppressive drugs, initial encounter: Secondary | ICD-10-CM | POA: Insufficient documentation

## 2020-02-28 LAB — CMP (CANCER CENTER ONLY)
ALT: 11 U/L (ref 0–44)
AST: 11 U/L — ABNORMAL LOW (ref 15–41)
Albumin: 3.5 g/dL (ref 3.5–5.0)
Alkaline Phosphatase: 60 U/L (ref 38–126)
Anion gap: 10 (ref 5–15)
BUN: 14 mg/dL (ref 8–23)
CO2: 27 mmol/L (ref 22–32)
Calcium: 9.9 mg/dL (ref 8.9–10.3)
Chloride: 105 mmol/L (ref 98–111)
Creatinine: 0.71 mg/dL (ref 0.61–1.24)
GFR, Estimated: >60 mL/min (ref >60)
Glucose, Bld: 85 mg/dL (ref 70–99)
Potassium: 4 mmol/L (ref 3.5–5.1)
Sodium: 142 mmol/L (ref 135–145)
Total Bilirubin: <0.2 mg/dL — ABNORMAL LOW (ref 0.3–1.2)
Total Protein: 7.6 g/dL (ref 6.5–8.1)

## 2020-02-28 LAB — CBC WITH DIFFERENTIAL (CANCER CENTER ONLY)
Abs Immature Granulocytes: 0.01 10*3/uL (ref 0.00–0.07)
Basophils Absolute: 0 10*3/uL (ref 0.0–0.1)
Basophils Relative: 1 %
Eosinophils Absolute: 0 10*3/uL (ref 0.0–0.5)
Eosinophils Relative: 2 %
HCT: 33.7 % — ABNORMAL LOW (ref 39.0–52.0)
Hemoglobin: 10.5 g/dL — ABNORMAL LOW (ref 13.0–17.0)
Immature Granulocytes: 1 %
Lymphocytes Relative: 14 %
Lymphs Abs: 0.2 10*3/uL — ABNORMAL LOW (ref 0.7–4.0)
MCH: 22.2 pg — ABNORMAL LOW (ref 26.0–34.0)
MCHC: 31.2 g/dL (ref 30.0–36.0)
MCV: 71.1 fL — ABNORMAL LOW (ref 80.0–100.0)
Monocytes Absolute: 0.3 10*3/uL (ref 0.1–1.0)
Monocytes Relative: 17 %
Neutro Abs: 1 10*3/uL — ABNORMAL LOW (ref 1.7–7.7)
Neutrophils Relative %: 65 %
Platelet Count: 211 10*3/uL (ref 150–400)
RBC: 4.74 MIL/uL (ref 4.22–5.81)
RDW: 23.1 % — ABNORMAL HIGH (ref 11.5–15.5)
WBC Count: 1.6 10*3/uL — ABNORMAL LOW (ref 4.0–10.5)
nRBC: 0 % (ref 0.0–0.2)

## 2020-02-28 MED ORDER — PALONOSETRON HCL INJECTION 0.25 MG/5ML
0.2500 mg | Freq: Once | INTRAVENOUS | Status: AC
  Administered 2020-02-28: 0.25 mg via INTRAVENOUS

## 2020-02-28 MED ORDER — SODIUM CHLORIDE 0.9 % IV SOLN
45.0000 mg/m2 | Freq: Once | INTRAVENOUS | Status: AC
  Administered 2020-02-28: 66 mg via INTRAVENOUS
  Filled 2020-02-28: qty 11

## 2020-02-28 MED ORDER — SODIUM CHLORIDE 0.9 % IV SOLN
Freq: Once | INTRAVENOUS | Status: AC
  Filled 2020-02-28: qty 250

## 2020-02-28 MED ORDER — DIPHENHYDRAMINE HCL 50 MG/ML IJ SOLN
INTRAMUSCULAR | Status: AC
  Filled 2020-02-28: qty 1

## 2020-02-28 MED ORDER — FAMOTIDINE IN NACL 20-0.9 MG/50ML-% IV SOLN
20.0000 mg | Freq: Once | INTRAVENOUS | Status: AC
  Administered 2020-02-28: 20 mg via INTRAVENOUS

## 2020-02-28 MED ORDER — SODIUM CHLORIDE 0.9 % IV SOLN
189.8000 mg | Freq: Once | INTRAVENOUS | Status: AC
  Administered 2020-02-28: 190 mg via INTRAVENOUS
  Filled 2020-02-28: qty 19

## 2020-02-28 MED ORDER — SODIUM CHLORIDE 0.9 % IV SOLN
20.0000 mg | Freq: Once | INTRAVENOUS | Status: AC
  Administered 2020-02-28: 20 mg via INTRAVENOUS
  Filled 2020-02-28: qty 20

## 2020-02-28 MED ORDER — PALONOSETRON HCL INJECTION 0.25 MG/5ML
INTRAVENOUS | Status: AC
  Filled 2020-02-28: qty 5

## 2020-02-28 MED ORDER — DIPHENHYDRAMINE HCL 50 MG/ML IJ SOLN
50.0000 mg | Freq: Once | INTRAMUSCULAR | Status: AC
  Administered 2020-02-28: 50 mg via INTRAVENOUS

## 2020-02-28 MED ORDER — FAMOTIDINE IN NACL 20-0.9 MG/50ML-% IV SOLN
INTRAVENOUS | Status: AC
  Filled 2020-02-28: qty 50

## 2020-02-28 NOTE — Telephone Encounter (Signed)
Scheduled appt per sch msg. Called and spoke with admin of patients. Confirmed appt

## 2020-02-28 NOTE — Patient Instructions (Signed)
[facility] Discharge Instructions for Patients Receiving Chemotherapy  Today you received the following chemotherapy agents: Paclitaxel and Carboplatin  To help prevent nausea and vomiting after your treatment, we encourage you to take your nausea medication  as prescribed.    If you develop nausea and vomiting that is not controlled by your nausea medication, call the clinic.   BELOW ARE SYMPTOMS THAT SHOULD BE REPORTED IMMEDIATELY:  *FEVER GREATER THAN 100.5 F  *CHILLS WITH OR WITHOUT FEVER  NAUSEA AND VOMITING THAT IS NOT CONTROLLED WITH YOUR NAUSEA MEDICATION  *UNUSUAL SHORTNESS OF BREATH  *UNUSUAL BRUISING OR BLEEDING  TENDERNESS IN MOUTH AND THROAT WITH OR WITHOUT PRESENCE OF ULCERS  *URINARY PROBLEMS  *BOWEL PROBLEMS  UNUSUAL RASH Items with * indicate a potential emergency and should be followed up as soon as possible.  Feel free to call the clinic should you have any questions or concerns. The clinic phone number is [phone].  Please show the [facility] at check-in to the Emergency Department and triage nurse.

## 2020-02-28 NOTE — Progress Notes (Signed)
Approximately 15 minutes into patient's carboplatin infusion, patient noted a change that his arm appeared edematous. Upon examination, significant swelling noted. Infusion stopped immediately and patient's arm elevated. Cold compressed placed on the patient's arm and Cassie Heilingoetter, PA-C and Burns Spain, NP called to bedside.  IV disconnected and attempts made to aspirate contents. No contents aspirated and no blood return noted. Patient with no complaints of pain or discomfort. Heilingoetter and Wynetta Emery assessed patient. Received orders to remove the IV, continue ice for 20 minutes, and restart carboplatin infusion once new IV established.  Patient received education on after-care and verbalized an understanding of the education. Patient to come after radiation treatment tomorrow to be assessed again at symptom management clinic by Burns Spain, NP. Patient made aware.  Burns Spain, NP assessed patient 35 minutes following interventions. Noted remarkable decrease in swelling. Patient still reports no pain and full function of extremity.

## 2020-02-28 NOTE — Progress Notes (Signed)
Patient seen in infusion today when swelling was noted in the right forearm where Carbo was infusing. The Carbo was stopped, ice was applied and arm was elevated.   At time of examination, approximately 5 minutes after Carbo had been stopped, the right forearm was noticeably swollen compared to the left forearm. IV site was just below the antecubital fossa, and some mild swelling was also noted superior to the site.   No flashback of blood noted according to the RN, Junie Panning. Advised discontinuing the IV, and continuing ice for total of 20 minutes. Patient instructed to reapply ice for 20 min intervals at home.   Patient denies pain, burning or discomfort.  We will see him tomorrow for further assessment if he has continued or increased swelling. At this time no loss of feeling, function, and no obvious tissue damage noted.   Burns Spain, NP-C, AONCP.

## 2020-02-29 ENCOUNTER — Other Ambulatory Visit: Payer: Self-pay

## 2020-02-29 ENCOUNTER — Inpatient Hospital Stay (HOSPITAL_BASED_OUTPATIENT_CLINIC_OR_DEPARTMENT_OTHER): Payer: Self-pay | Admitting: Nurse Practitioner

## 2020-02-29 ENCOUNTER — Ambulatory Visit
Admission: RE | Admit: 2020-02-29 | Discharge: 2020-02-29 | Disposition: A | Discharge status: Home / Self Care | Payer: Self-pay | Source: Ambulatory Visit | Attending: Radiation Oncology | Admitting: Radiation Oncology

## 2020-02-29 ENCOUNTER — Telehealth: Payer: Self-pay | Admitting: Emergency Medicine

## 2020-02-29 VITALS — BP 127/79 | HR 99 | Temp 98.2°F | Resp 18 | Ht 61.0 in | Wt 113.8 lb

## 2020-02-29 DIAGNOSIS — C3411 Malignant neoplasm of upper lobe, right bronchus or lung: Secondary | ICD-10-CM

## 2020-02-29 DIAGNOSIS — T801XXD Vascular complications following infusion, transfusion and therapeutic injection, subsequent encounter: Secondary | ICD-10-CM

## 2020-02-29 NOTE — Telephone Encounter (Signed)
Spoke with Butch Penny in [place] with Dr. Ida Rogue office.  Pt needs extravasation site rechecked on 03/01/20 per NP Burns Spain, however [facility] will be closed.  Butch Penny states she will check the patient's extravasation site tomorrow morning during his radiation treatment.  Paperwork for documentation given to RN Butch Penny.

## 2020-02-29 NOTE — Progress Notes (Signed)
Pt seen by NP Burns Spain only, no assessment by [facility] RN at this time d/t time constraints.  NP Sarah aware.

## 2020-02-29 NOTE — Patient Instructions (Signed)

## 2020-02-29 NOTE — Progress Notes (Signed)
Symptoms Management Clinic Progress Note   Arth [name] [name] [id number] 03-Jan-1956 64 y.o.  [name] is managed by Dr. Earlie Server  Actively treated with chemotherapy/immunotherapy/hormonal therapy: yes  Current therapy: Carboplatin/ Taxol Last treated: 02/28/2020 with Carbo/ Taxol  Next scheduled appointment with provider: 03/21/2020  Assessment: Plan:    No diagnosis found.  Please see After Visit Summary for patient specific instructions.  Future Appointments  Date Time Provider [place]  03/01/2020  9:15 AM [facility] 4 [facility] None  03/02/2020  9:15 AM [facility] None  03/15/2020 11:10 AM [name] Perna, NP [facility] None  03/19/2020  9:00 AM [facility] [place]  03/21/2020  9:45 AM Curt Bears, MD [facility] None    No orders of the defined types were placed in this encounter.      Subjective:   Patient ID:  [name] [name] is a 64 y.o. (DOB 1955/11/23) male.  Chief Complaint:  Chief Complaint  Patient presents with  . Extravasation Recheck    HPI [name] [name] had [place] infusing yesterday afternoon in his right arm when the IV infiltrated. I saw him in infusion immediately afterward; there was no flashback of blood from the peripheral IV site. Infusion was stopped, arm was elevated and ice applied. He denied any pain yesterday at all and had no loss of function or obvious tissue degradation. He was encouraged to ice it at home in intervals of 20 minutes. Today he presents for evaluation - it has been less than 24 hours and the swelling is already decreasing. I did measure the right forearm (9.5 compared to the regular arm which had 8.5). I also measured the bicep which showed a 0.25 inch difference with the right being larger.   He will follow up tomorrow with the [facility] RN for further assessment.   Medications: I have reviewed the patient's current medications.  Allergies: No Known  Allergies  Past Medical History:  Diagnosis Date  . 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[place]) 12/25/2019  . COPD (chronic obstructive pulmonary disease) ([place]) 11/02/2007   Qualifier: Diagnosis of  By: Melvyn Novas MD, Christena Deem   . Hypertension 12/24/2019  . Iron deficiency anemia 08/23/2007   Qualifier: Diagnosis of  By: Jim Like      Past Surgical History:  Procedure Laterality Date  . ENDOBRONCHIAL ULTRASOUND N/A 12/27/2019   Procedure: ENDOBRONCHIAL ULTRASOUND;  Surgeon: Laurin Coder, MD;  [facility];  Service: Endoscopy;  Laterality: N/A;  . FINE NEEDLE ASPIRATION  12/27/2019   Procedure: FINE NEEDLE ASPIRATION;  Surgeon: Laurin Coder, MD;  [facility];  Service: Endoscopy;;  . VIDEO BRONCHOSCOPY N/A 12/27/2019   Procedure: VIDEO BRONCHOSCOPY WITHOUT FLUORO;  Surgeon: Laurin Coder, MD;  [facility];  Service: Endoscopy;  Laterality: N/A;    Family History  Problem Relation Age of Onset  . Hypertension Other     Social History   Socioeconomic History  . Marital status: Single    Spouse name: Not on file  . Number of children: Not on file  . Years of education: Not on file  . Highest education level: Not on file  Occupational History  . Not on file  Tobacco Use  . Smoking status: Former Smoker    Packs/day: 0.50    Types: Cigarettes  . Smokeless tobacco: Never Used  . Tobacco comment: Pt states he stopped since he was discharged from the hospital  Vaping Use  . Vaping Use: Never used  Substance and Sexual Activity  .  Alcohol use: Not Currently  . Drug use: Never  . Sexual activity: Not Currently  Other Topics Concern  . Not on file  Social History Narrative  . Not on file   Social Determinants of Health   Financial Resource Strain: High Risk  . Difficulty of Paying Living Expenses: Hard  Food Insecurity: No Food Insecurity  . Worried About Charity fundraiser in the Last Year: Never true  . Ran Out of Food in the Last Year:  Never true  Transportation Needs: Unmet Transportation Needs  . Lack of Transportation (Medical): Yes  . Lack of Transportation (Non-Medical): Yes  Physical Activity:   . Days of Exercise per Week: Not on file  . Minutes of Exercise per Session: Not on file  Stress:   . Feeling of Stress : Not on file  Social Connections:   . Frequency of Communication with Friends and Family: Not on file  . Frequency of Social Gatherings with Friends and Family: Not on file  . Attends Religious Services: Not on file  . Active Member of Clubs or Organizations: Not on file  . Attends Archivist Meetings: Not on file  . Marital Status: Not on file  Intimate Partner Violence:   . Fear of Current or Ex-Partner: Not on file  . Emotionally Abused: Not on file  . Physically Abused: Not on file  . Sexually Abused: Not on file    Past Medical History, Surgical history, Social history, and Family history were reviewed and updated as appropriate.   Please see review of systems for further details on the patient's review from today.   Review of Systems:  Review of Systems -complaint of mild pain to right arm which was relieved by Tylenol; ROS otherwise negative  Objective:   Physical Exam:  BP 127/79 (BP Location: Left Arm, Patient Position: Sitting)   Pulse 99   Temp 98.2 F (36.8 C) (Tympanic)   Resp 18   Ht 5\' 1"  (1.549 m)   Wt 113 lb 12.8 oz (51.6 kg)   SpO2 100%   BMI 21.50 kg/m  ECOG: 1  Physical Exam - limited to examination of arms. Circumference of the right arm at 2 inches below the antecubital fossa was 9.5 as compared to the circumference of the left arm which was 8.5. Apart from swelling, he has no numbness, no loss of function, and no obvious tissue degradation. He states he did put ice on it last night and had some mild pain which was fully relieved by Tylenol. His bicep on his right arm measured  9.5 as compared to 9.25 on the left.   Lab Review:     Component Value  Date/Time   NA 142 02/28/2020 0922   NA 144 08/03/2019 1527   K 4.0 02/28/2020 0922   CL 105 02/28/2020 0922   CO2 27 02/28/2020 0922   GLUCOSE 85 02/28/2020 0922   BUN 14 02/28/2020 0922   BUN 9 08/03/2019 1527   CREATININE 0.71 02/28/2020 0922   CALCIUM 9.9 02/28/2020 0922   PROT 7.6 02/28/2020 0922   PROT 7.3 08/03/2019 1527   ALBUMIN 3.5 02/28/2020 0922   ALBUMIN 4.6 08/03/2019 1527   AST 11 (L) 02/28/2020 0922   ALT 11 02/28/2020 0922   ALKPHOS 60 02/28/2020 0922   BILITOT <0.2 (L) 02/28/2020 0922   GFRNONAA >60 02/28/2020 0922   GFRAA >60 02/14/2020 0922       Component Value Date/Time   WBC 1.6 (L) 02/28/2020  0922   WBC 4.5 12/25/2019 0515   RBC 4.74 02/28/2020 0922   HGB 10.5 (L) 02/28/2020 0922   HGB 12.3 (L) 08/03/2019 1527   HCT 33.7 (L) 02/28/2020 0922   HCT 38.5 08/03/2019 1527   PLT 211 02/28/2020 0922   PLT 320 08/03/2019 1527   MCV 71.1 (L) 02/28/2020 0922   MCV 76 (L) 08/03/2019 1527   MCH 22.2 (L) 02/28/2020 0922   MCHC 31.2 02/28/2020 0922   RDW 23.1 (H) 02/28/2020 0922   RDW 15.8 (H) 08/03/2019 1527   LYMPHSABS 0.2 (L) 02/28/2020 0922   LYMPHSABS 2.3 08/03/2019 1527   MONOABS 0.3 02/28/2020 0922   EOSABS 0.0 02/28/2020 0922   EOSABS 0.1 08/03/2019 1527   BASOSABS 0.0 02/28/2020 0922   BASOSABS 0.0 08/03/2019 1527   -------------------------------  Imaging from last 24 hours (if applicable):  Radiology interpretation: No results found.

## 2020-03-01 ENCOUNTER — Ambulatory Visit
Admission: RE | Admit: 2020-03-01 | Discharge: 2020-03-01 | Disposition: A | Discharge status: Home / Self Care | Payer: Self-pay | Source: Ambulatory Visit | Attending: Radiation Oncology | Admitting: Radiation Oncology

## 2020-03-02 ENCOUNTER — Ambulatory Visit
Admission: RE | Admit: 2020-03-02 | Discharge: 2020-03-02 | Disposition: A | Discharge status: Home / Self Care | Payer: Self-pay | Source: Ambulatory Visit | Attending: Radiation Oncology | Admitting: Radiation Oncology

## 2020-03-05 ENCOUNTER — Telehealth: Payer: Self-pay | Admitting: Emergency Medicine

## 2020-03-05 NOTE — Telephone Encounter (Signed)
Spoke with Vickii Chafe who agreed to 11 am appt in [facility] tomorrow (10/19) to have final check of arm post-extravasation done.  Appt created.

## 2020-03-06 ENCOUNTER — Inpatient Hospital Stay (HOSPITAL_BASED_OUTPATIENT_CLINIC_OR_DEPARTMENT_OTHER): Payer: Self-pay | Admitting: Medical

## 2020-03-06 ENCOUNTER — Other Ambulatory Visit: Payer: Self-pay

## 2020-03-06 ENCOUNTER — Ambulatory Visit (HOSPITAL_COMMUNITY)
Admission: RE | Admit: 2020-03-06 | Discharge: 2020-03-06 | Disposition: A | Discharge status: Home / Self Care | Payer: Self-pay | Source: Ambulatory Visit | Attending: Primary Care | Admitting: Primary Care

## 2020-03-06 VITALS — BP 111/71 | HR 112 | Temp 97.9°F | Resp 18 | Ht 61.0 in | Wt 112.5 lb

## 2020-03-06 DIAGNOSIS — T80818D Extravasation of other vesicant agent, subsequent encounter: Secondary | ICD-10-CM | POA: Insufficient documentation

## 2020-03-06 DIAGNOSIS — L03113 Cellulitis of right upper limb: Secondary | ICD-10-CM

## 2020-03-06 DIAGNOSIS — M7989 Other specified soft tissue disorders: Secondary | ICD-10-CM | POA: Insufficient documentation

## 2020-03-06 MED ORDER — SULFAMETHOXAZOLE-TRIMETHOPRIM 800-160 MG PO TABS: 1.0000 | ORAL_TABLET | Freq: Two times a day (BID) | ORAL | 0 refills | Status: DC

## 2020-03-06 MED FILL — SULFAMETHOXAZOLE-TMP DS TAB: 800-160 | 7 days supply | Qty: 14 | Fill #0

## 2020-03-06 NOTE — Progress Notes (Signed)
Upper extremity venous RT study completed.  Preliminary results relayed to [place], PA.   See CV Proc for preliminary results report.   Darlin Coco, RDMS

## 2020-03-08 ENCOUNTER — Encounter: Payer: Self-pay | Admitting: Radiation Oncology

## 2020-03-08 NOTE — Progress Notes (Signed)
Mr. [name] [name] was seen in follow-up of an extravasation of carboplatin in his right proximal forearm on 02/28/2020.  He presents to the clinic today with continued swelling in his right forearm with the right forearm measuring 10.75 inches at 2 inches distal to the antecubital fossa and the left measuring 9.5 inches at 2 inches distal to the left antecubital fossa.  Based on this he was referred for a Doppler ultrasound of his left upper extremity which returned negative for a DVT.  Since his right forearm was erythematous and warm to the touch he was placed on Bactrim DS p.o. twice daily x7 days.  He was told to return to the clinic as needed.  He expressed understanding and agreement with this plan.  Sandi Mealy, MHS, PA-C Physician Assistant

## 2020-03-15 ENCOUNTER — Ambulatory Visit (INDEPENDENT_AMBULATORY_CARE_PROVIDER_SITE_OTHER): Payer: Self-pay | Admitting: Primary Care

## 2020-03-15 ENCOUNTER — Encounter (INDEPENDENT_AMBULATORY_CARE_PROVIDER_SITE_OTHER): Payer: Self-pay | Admitting: Primary Care

## 2020-03-15 ENCOUNTER — Other Ambulatory Visit: Payer: Self-pay

## 2020-03-15 VITALS — BP 98/63 | HR 110 | Temp 98.4°F | Ht 61.0 in

## 2020-03-15 DIAGNOSIS — C3411 Malignant neoplasm of upper lobe, right bronchus or lung: Secondary | ICD-10-CM

## 2020-03-15 DIAGNOSIS — I959 Hypotension, unspecified: Secondary | ICD-10-CM

## 2020-03-15 DIAGNOSIS — E440 Moderate protein-calorie malnutrition: Secondary | ICD-10-CM

## 2020-03-15 MED ORDER — ENSURE ENLIVE PO LIQD: 237.0000 mL | Freq: Two times a day (BID) | ORAL | 12 refills | Status: AC

## 2020-03-15 NOTE — Patient Instructions (Signed)
Protein-Energy Malnutrition Protein-energy malnutrition is when a person does not eat enough protein, fat, and calories. When this happens over time, it can lead to severe loss of muscle tissue (muscle wasting). This condition also affects the body's defense system (immune system) and can lead to other health problems. What are the causes? This condition may be caused by:  Not eating enough protein, fat, or calories.  Having certain chronic medical conditions.  Eating too little. What increases the risk? The following factors may make you more likely to develop this condition:  Living in poverty.  Long-term hospitalization.  Alcohol or drug dependency. Addiction often leads to a lifestyle in which proper diet is ignored. Dependency can also hurt the metabolism and the body's ability to absorb nutrients.  Eating disorders, such as anorexia nervosa or bulimia.  Chewing or swallowing problems. People with these disorders may not eat enough.  Having certain conditions, such as: ? Inflammatory bowel disease. Inflammation of the intestines makes it difficult for the body to absorb nutrients. ? Cancer or AIDS. These diseases can cause a loss of appetite. ? Chronic heart failure. This interferes with how the body uses nutrients. ? Cystic fibrosis. This disease can make it difficult for the body to absorb nutrients.  Eating a diet that extremely restricts protein, fat, or calorie intake. What are the signs or symptoms? Symptoms of this condition include:  Fatigue.  Weakness.  Dizziness.  Fainting.  Weight loss.  Loss of muscle tone and muscle mass.  Poor immune response.  Lack of menstruation.  Poor memory.  Hair loss.  Skin changes. How is this diagnosed? This condition may be diagnosed based on:  Your medical and dietary history.  A physical exam. This may include a measurement of your body mass index (BMI).  Blood tests. How is this treated? This condition may  be managed with:  Nutrition therapy. This may include working with a diet and nutrition specialist (dietitian).  Treatment for underlying conditions. People with severe protein-energy malnutrition may need to be treated in a hospital. This may involve receiving nutrition and fluids through an IV. Follow these instructions at home:   Eat a balanced diet. In each meal, include at least one food that is high in protein. Foods that are high in protein include: ? Meat. ? Poultry. ? Fish. ? Eggs. ? Cheese. ? Milk. ? Beans. ? Nuts.  Eat nutrient-rich foods that are easy to swallow and digest, such as: ? Fruit and yogurt smoothies. ? Oatmeal with nut butter.  Try to eat six small meals each day instead of three large meals.  Take vitamin and protein supplements as told by your health care provider or dietitian.  Follow your health care provider's recommendations about exercise and activity.  Keep all follow-up visits as told by your health care provider. This is important. Contact a health care provider if you:  Have increased weakness or fatigue.  Faint.  Are a woman and you stop having your period (menstruating).  Have rapid hair loss.  Have unexpected weight loss.  Have diarrhea.  Have nausea and vomiting. Get help right away if you have:  Difficulty breathing.  Chest pain. Summary  Protein-energy malnutrition is when a person does not eat enough protein, fat, and calories.  Protein-energy malnutrition can lead to severe loss of muscle tissue (muscle wasting). This condition also affects the body's defense system (immune system) and can lead to other health problems.  Talk with your health care provider about treatment for this   condition. Effective treatment depends on the underlying cause of the malnutrition. This information is not intended to replace advice given to you by your health care provider. Make sure you discuss any questions you have with your health  care provider. Document Revised: 05/20/2017 Document Reviewed: 05/20/2017 Elsevier Patient Education  2020 Elsevier Inc.  

## 2020-03-15 NOTE — Progress Notes (Signed)
Established Patient Office Visit  Subjective:  Patient ID: [name] [name], male    DOB: 09-13-1955  Age: 64 y.o. MRN: [id number]  CC:  Chief Complaint  Patient presents with  . Hypertension    HPI [name] [name] is a 65 year old male who presents for complaints of blood pressure.  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 Past Medical History:  Diagnosis Date  . 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[place]) 12/25/2019  . COPD (chronic obstructive pulmonary disease) ([place]) 11/02/2007   Qualifier: Diagnosis of  By: Melvyn Novas MD, Christena Deem   . Hypertension 12/24/2019  . Iron deficiency anemia 08/23/2007   Qualifier: Diagnosis of  By: Jim Like      Past Surgical History:  Procedure Laterality Date  . ENDOBRONCHIAL ULTRASOUND N/A 12/27/2019   Procedure: ENDOBRONCHIAL ULTRASOUND;  Surgeon: Laurin Coder, MD;  Location: [facility];  Service: Endoscopy;  Laterality: N/A;  . FINE NEEDLE ASPIRATION  12/27/2019   Procedure: FINE NEEDLE ASPIRATION;  Surgeon: Laurin Coder, MD;  Location: [facility];  Service: Endoscopy;;  . VIDEO BRONCHOSCOPY N/A 12/27/2019   Procedure: VIDEO BRONCHOSCOPY WITHOUT FLUORO;  Surgeon: Laurin Coder, MD;  Location: [facility];  Service: Endoscopy;  Laterality: N/A;    Family History  Problem Relation Age of Onset  . Hypertension Other     Social History   Socioeconomic History  . Marital status: Single    Spouse name: Not on file  . Number of children: Not on file  . Years of education: Not on file  . Highest education level: Not on file  Occupational History  . Not on file  Tobacco Use  . Smoking status: Former Smoker    Packs/day: 0.50    Types: Cigarettes  . Smokeless tobacco: Never Used  . Tobacco comment: Pt states he stopped since he was discharged from the hospital   Vaping Use  . Vaping Use: Never used  Substance and Sexual Activity  . Alcohol use: Not Currently  . Drug use: Never  . Sexual activity: Not Currently  Other Topics Concern  . Not on file  Social History Narrative  . Not on file   Social Determinants of Health   Financial Resource Strain: High Risk  . Difficulty of Paying Living Expenses: Hard  Food Insecurity: No Food Insecurity  . Worried About Charity fundraiser in the Last Year: Never true  . Ran Out of Food in the Last Year: Never true  Transportation Needs: Unmet Transportation Needs  . Lack of Transportation (Medical): Yes  . Lack of Transportation (Non-Medical): Yes  Physical Activity:   . Days of Exercise per Week: Not on file  . Minutes of Exercise per Session: Not on file  Stress:   . Feeling of Stress : Not on file  Social Connections:   . Frequency of Communication with Friends and Family: Not on file  . Frequency of Social Gatherings with Friends and Family: Not on file  . Attends Religious Services: Not on file  . Active Member of Clubs or Organizations: Not on file  . Attends Archivist Meetings: Not on file  . Marital Status: Not on file  Intimate Partner Violence:   . Fear of Current or Ex-Partner: Not on file  . Emotionally Abused: Not on file  . Physically Abused: Not on file  .  Sexually Abused: Not on file    Outpatient Medications Prior to Visit  Medication Sig Dispense Refill  . acetaminophen (TYLENOL) 500 MG tablet Take 500 mg by mouth every 6 (six) hours as needed for mild pain or moderate pain.    Marland Kitchen aspirin 81 MG EC tablet Take 1 tablet (81 mg total) by mouth daily. 30 tablet 11  . budesonide-formoterol (SYMBICORT) 80-4.5 MCG/ACT inhaler Inhale 2 puffs into the lungs 2 (two) times daily. (Patient taking differently: Inhale 2 puffs into the lungs 2 (two) times daily as needed (wheezing, SOB). ) 1 Inhaler 11  . Multiple Vitamin (MULTIVITAMIN) tablet Take 1 tablet by mouth daily.    .  prochlorperazine (COMPAZINE) 10 MG tablet Take 1 tablet (10 mg total) by mouth every 6 (six) hours as needed for nausea or vomiting. 30 tablet 0  . senna-docusate (SENOKOT-S) 8.6-50 MG tablet Take 1 tablet by mouth at bedtime.    Marland Kitchen SPIRIVA HANDIHALER 18 MCG inhalation capsule 1 capsule daily.    . sucralfate (CARAFATE) 1 g tablet Take 1 tablet (1 g total) by mouth 4 (four) times daily. Dissolve each tablet in 15 cc water before use. 120 tablet 2  . sulfamethoxazole-trimethoprim (BACTRIM DS) 800-160 MG tablet Take 1 tablet by mouth 2 (two) times daily. Drink at least 48 or more ounces of water while on this antibiotic 14 tablet 0  . VENTOLIN HFA 108 (90 Base) MCG/ACT inhaler Inhale 2 puffs into the lungs every 4 (four) hours as needed.    . feeding supplement, ENSURE ENLIVE, (ENSURE ENLIVE) LIQD Take 237 mLs by mouth 2 (two) times daily between meals. 237 mL 12   No facility-administered medications prior to visit.    No Known Allergies  ROS Review of Systems  Cardiovascular: Positive for chest pain.       Daily middle of chest migrates no where   All other systems reviewed and are negative.     Objective:    Physical Exam Vitals reviewed.  Constitutional:      Comments: Cachetic   HENT:     Head: Normocephalic.     Right Ear: Tympanic membrane normal.     Left Ear: Tympanic membrane normal.     Nose: Nose normal.  Eyes:     Extraocular Movements: Extraocular movements intact.  Pulmonary:     Effort: Pulmonary effort is normal.     Breath sounds: Normal breath sounds.  Abdominal:     General: Abdomen is flat. Bowel sounds are normal.  Musculoskeletal:        General: Normal range of motion.     Cervical back: Normal range of motion.  Skin:    General: Skin is warm and dry.  Neurological:     Mental Status: He is alert and oriented to person, place, and time.  Psychiatric:        Mood and Affect: Mood normal.        Behavior: Behavior normal.        Thought Content:  Thought content normal.        Judgment: Judgment normal.     BP 98/63 (BP Location: Right Arm, Patient Position: Sitting, Cuff Size: Normal)   Pulse (!) 110   Temp 98.4 F (36.9 C) (Temporal)   Ht 5\' 1"  (1.549 m)   SpO2 98%   BMI 21.26 kg/m  Wt Readings from Last 3 Encounters:  03/06/20 112 lb 8 oz (51 kg)  02/29/20 113 lb 12.8 oz (51.6 kg)  02/28/20  113 lb 6.4 oz (51.4 kg)     There are no preventive care reminders to display for this patient.  There are no preventive care reminders to display for this patient.  Lab Results  Component Value Date   TSH 1.291 Test methodology is 3rd generation TSH 05/28/2008   Lab Results  Component Value Date   WBC 1.6 (L) 02/28/2020   HGB 10.5 (L) 02/28/2020   HCT 33.7 (L) 02/28/2020   MCV 71.1 (L) 02/28/2020   PLT 211 02/28/2020   Lab Results  Component Value Date   NA 142 02/28/2020   K 4.0 02/28/2020   CO2 27 02/28/2020   GLUCOSE 85 02/28/2020   BUN 14 02/28/2020   CREATININE 0.71 02/28/2020   BILITOT <0.2 (L) 02/28/2020   ALKPHOS 60 02/28/2020   AST 11 (L) 02/28/2020   ALT 11 02/28/2020   PROT 7.6 02/28/2020   ALBUMIN 3.5 02/28/2020   CALCIUM 9.9 02/28/2020   ANIONGAP 10 02/28/2020   Lab Results  Component Value Date   CHOL 195 08/03/2019   Lab Results  Component Value Date   HDL 65 08/03/2019   Lab Results  Component Value Date   LDLCALC 109 (H) 08/03/2019   Lab Results  Component Value Date   TRIG 119 08/03/2019   Lab Results  Component Value Date   CHOLHDL 3.0 08/03/2019   Lab Results  Component Value Date   HGBA1C 5.6 05/31/2018      Assessment & Plan:   Izack was seen today for hypertension.  Diagnoses and all orders for this visit:  Moderate protein-calorie malnutrition ([place]) -     feeding supplement (ENSURE ENLIVE / ENSURE PLUS) LIQD; Take 237 mLs by mouth 2 (two) times daily between meals.  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  Noted  chemotherapy induced neutropenia . Hypotension, unspecified hypotension type Likely due to malnutrition and poor hydration  Meds ordered this encounter  Medications  . feeding supplement (ENSURE ENLIVE / ENSURE PLUS) LIQD    Sig: Take 237 mLs by mouth 2 (two) times daily between meals.    Dispense:  237 mL    Refill:  12    Follow-up: Return if symptoms worsen or fail to improve.    [name], NP

## 2020-03-16 ENCOUNTER — Telehealth: Payer: Self-pay | Admitting: Physician Assistant

## 2020-03-16 NOTE — Telephone Encounter (Signed)
Scheduled per los, patient has been called and notified. 

## 2020-03-19 ENCOUNTER — Ambulatory Visit (HOSPITAL_COMMUNITY): Payer: Self-pay

## 2020-03-20 ENCOUNTER — Ambulatory Visit (HOSPITAL_COMMUNITY): Admission: RE | Admit: 2020-03-20 | Payer: Self-pay | Source: Ambulatory Visit

## 2020-03-21 ENCOUNTER — Inpatient Hospital Stay: Payer: Self-pay | Attending: Internal Medicine | Admitting: Internal Medicine

## 2020-03-21 ENCOUNTER — Other Ambulatory Visit: Payer: Self-pay

## 2020-03-21 ENCOUNTER — Encounter: Payer: Self-pay | Admitting: Internal Medicine

## 2020-03-21 VITALS — BP 113/65 | HR 117 | Temp 97.8°F | Resp 18 | Wt 113.0 lb

## 2020-03-21 DIAGNOSIS — Z5112 Encounter for antineoplastic immunotherapy: Secondary | ICD-10-CM | POA: Insufficient documentation

## 2020-03-21 DIAGNOSIS — Z79899 Other long term (current) drug therapy: Secondary | ICD-10-CM | POA: Insufficient documentation

## 2020-03-21 DIAGNOSIS — Z23 Encounter for immunization: Secondary | ICD-10-CM | POA: Insufficient documentation

## 2020-03-21 DIAGNOSIS — C3491 Malignant neoplasm of unspecified part of right bronchus or lung: Secondary | ICD-10-CM | POA: Insufficient documentation

## 2020-03-21 DIAGNOSIS — C3411 Malignant neoplasm of upper lobe, right bronchus or lung: Secondary | ICD-10-CM

## 2020-03-21 DIAGNOSIS — Z5111 Encounter for antineoplastic chemotherapy: Secondary | ICD-10-CM

## 2020-03-21 NOTE — Progress Notes (Signed)
[facility] [phone]   [phone]  OFFICE PROGRESS NOTE  [name], NP [address] [place] [address]  DIAGNOSIS: Stage IIIA (Tx, N2, M0) non-small cell lung cancer, adenocarcinoma diagnosed in August 2021 and presented with large right paratracheal soft tissue mass extending from the right apex to the right suprahilar and precarinal region.  Molecular studies by [employer]:  [id number], 31.9%, Olaparib  [id number], 68.6%. None   BRAFAmplification, High (+++) Plasma Copy Number: 5.0  PRIOR THERAPY: Concurrent chemoradiation with weekly carboplatin for AUC of 2 and paclitaxel 45 mg/M2.  First dose January 17, 2020.  Status post 7 cycles.  Last dose was given on February 28, 2020.  CURRENT THERAPY:   INTERVAL HISTORY: [name] [name] 64 y.o. male returns to the clinic today for follow-up visit.  The patient is feeling fine except for the baseline shortness of breath and cough.  He also has some hoarseness of his voice.  He denied having any chest pain or hemoptysis.  He denied having any nausea, vomiting, diarrhea or constipation.  He has no headache or visual changes.  He was supposed to have repeat CT scan of the chest performed before this visit but unfortunately the patient did not show up for his appointment.   MEDICAL HISTORY: Past Medical History:  Diagnosis Date  . 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[place]) 12/25/2019  . COPD (chronic obstructive pulmonary disease) ([place]) 11/02/2007   Qualifier: Diagnosis of  By: Melvyn Novas MD, Christena Deem   . Hypertension 12/24/2019  . Iron deficiency anemia 08/23/2007   Qualifier: Diagnosis of  By: Jim Like      ALLERGIES:  has No Known Allergies.  MEDICATIONS:  Current Outpatient Medications  Medication Sig Dispense Refill  . acetaminophen (TYLENOL) 500 MG tablet Take 500 mg by mouth every 6 (six) hours as needed for mild pain or moderate pain.    Marland Kitchen aspirin 81 MG EC tablet Take 1 tablet (81  mg total) by mouth daily. 30 tablet 11  . budesonide-formoterol (SYMBICORT) 80-4.5 MCG/ACT inhaler Inhale 2 puffs into the lungs 2 (two) times daily. (Patient taking differently: Inhale 2 puffs into the lungs 2 (two) times daily as needed (wheezing, SOB). ) 1 Inhaler 11  . feeding supplement (ENSURE ENLIVE / ENSURE PLUS) LIQD Take 237 mLs by mouth 2 (two) times daily between meals. 237 mL 12  . Multiple Vitamin (MULTIVITAMIN) tablet Take 1 tablet by mouth daily.    . prochlorperazine (COMPAZINE) 10 MG tablet Take 1 tablet (10 mg total) by mouth every 6 (six) hours as needed for nausea or vomiting. 30 tablet 0  . senna-docusate (SENOKOT-S) 8.6-50 MG tablet Take 1 tablet by mouth at bedtime.    Marland Kitchen SPIRIVA HANDIHALER 18 MCG inhalation capsule 1 capsule daily.    . sucralfate (CARAFATE) 1 g tablet Take 1 tablet (1 g total) by mouth 4 (four) times daily. Dissolve each tablet in 15 cc water before use. 120 tablet 2  . sulfamethoxazole-trimethoprim (BACTRIM DS) 800-160 MG tablet Take 1 tablet by mouth 2 (two) times daily. Drink at least 48 or more ounces of water while on this antibiotic 14 tablet 0  . VENTOLIN HFA 108 (90 Base) MCG/ACT inhaler Inhale 2 puffs into the lungs every 4 (four) hours as needed.     No current facility-administered medications for this visit.    SURGICAL HISTORY:  Past Surgical History:  Procedure Laterality Date  . ENDOBRONCHIAL ULTRASOUND N/A 12/27/2019   Procedure: ENDOBRONCHIAL ULTRASOUND;  Surgeon: Laurin Coder, MD;  [facility];  Service: Endoscopy;  Laterality: N/A;  . FINE NEEDLE ASPIRATION  12/27/2019   Procedure: FINE NEEDLE ASPIRATION;  Surgeon: Laurin Coder, MD;  Location: [facility];  Service: Endoscopy;;  . VIDEO BRONCHOSCOPY N/A 12/27/2019   Procedure: VIDEO BRONCHOSCOPY WITHOUT FLUORO;  Surgeon: Laurin Coder, MD;  Location[facility];  Service: Endoscopy;  Laterality: N/A;    REVIEW OF SYSTEMS:  A comprehensive review of  systems was negative except for: Constitutional: positive for fatigue Respiratory: positive for cough and dyspnea on exertion   PHYSICAL EXAMINATION: General appearance: alert, cooperative, fatigued and no distress Head: Normocephalic, without obvious abnormality, atraumatic Neck: no adenopathy, no JVD, supple, symmetrical, trachea midline and thyroid not enlarged, symmetric, no tenderness/mass/nodules Lymph nodes: Cervical, supraclavicular, and axillary nodes normal. Resp: clear to auscultation bilaterally Back: symmetric, no curvature. ROM normal. No CVA tenderness. Cardio: regular rate and rhythm, S1, S2 normal, no murmur, click, rub or gallop GI: soft, non-tender; bowel sounds normal; no masses,  no organomegaly Extremities: extremities normal, atraumatic, no cyanosis or edema  ECOG PERFORMANCE STATUS: 1 - Symptomatic but completely ambulatory  Blood pressure 113/65, pulse (!) 117, temperature 97.8 F (36.6 C), temperature source Tympanic, resp. rate 18, weight 113 lb 0.5 oz (51.3 kg), SpO2 97 %.  LABORATORY DATA: Lab Results  Component Value Date   WBC 1.6 (L) 02/28/2020   HGB 10.5 (L) 02/28/2020   HCT 33.7 (L) 02/28/2020   MCV 71.1 (L) 02/28/2020   PLT 211 02/28/2020      Chemistry      Component Value Date/Time   NA 142 02/28/2020 0922   NA 144 08/03/2019 1527   K 4.0 02/28/2020 0922   CL 105 02/28/2020 0922   CO2 27 02/28/2020 0922   BUN 14 02/28/2020 0922   BUN 9 08/03/2019 1527   CREATININE 0.71 02/28/2020 0922      Component Value Date/Time   CALCIUM 9.9 02/28/2020 0922   ALKPHOS 60 02/28/2020 0922   AST 11 (L) 02/28/2020 0922   ALT 11 02/28/2020 0922   BILITOT <0.2 (L) 02/28/2020 0922       RADIOGRAPHIC STUDIES: VAS [place] UPPER EXTREMITY VENOUS DUPLEX  Result Date: 03/06/2020 UPPER VENOUS STUDY  Indications: S/P carboplatin extravasation 02-28-2020 with redness and swelling of RUE Risk Factors: Cancer Non-small cell lung cancer, adenocarcinoma.  Comparison Study: No prior studies. Performing Technologist: Darlin Coco  Examination Guidelines: A complete evaluation includes B-mode imaging, spectral Doppler, color Doppler, and power Doppler as needed of all accessible portions of each vessel. Bilateral testing is considered an integral part of a complete examination. Limited examinations for reoccurring indications may be performed as noted.  Right Findings: +----------+------------+---------+-----------+----------+-------+ RIGHT     CompressiblePhasicitySpontaneousPropertiesSummary +----------+------------+---------+-----------+----------+-------+ IJV           Full       Yes       Yes                      +----------+------------+---------+-----------+----------+-------+ Subclavian    Full       Yes       Yes                      +----------+------------+---------+-----------+----------+-------+ Axillary      Full       Yes       Yes                      +----------+------------+---------+-----------+----------+-------+  Brachial      Full                                          +----------+------------+---------+-----------+----------+-------+ Radial        Full                                          +----------+------------+---------+-----------+----------+-------+ Ulnar         Full                                          +----------+------------+---------+-----------+----------+-------+ Cephalic      Full                                          +----------+------------+---------+-----------+----------+-------+ Basilic       Full                                          +----------+------------+---------+-----------+----------+-------+  Left Findings: +----------+------------+---------+-----------+----------+-------+ LEFT      CompressiblePhasicitySpontaneousPropertiesSummary +----------+------------+---------+-----------+----------+-------+ Subclavian    Full       Yes        Yes                      +----------+------------+---------+-----------+----------+-------+  Summary:  Right: No evidence of deep vein thrombosis in the upper extremity. No evidence of superficial vein thrombosis in the upper extremity.  Left: No evidence of thrombosis in the subclavian.  *See table(s) above for measurements and observations.  Diagnosing physician: Curt Jews MD Electronically signed by Curt Jews MD on 03/06/2020 at 4:24:56 PM.    Final     ASSESSMENT AND PLAN: This is a very pleasant 64 years old African-American male recently diagnosed with stage IIIA (Tx, N2, M0) non-small cell lung cancer, adenocarcinoma presented with large right paratracheal soft tissue mass extending from the right apex to the right suprahilar and precarinal region diagnosed in August 2021. Molecular studies by [employer] showed no actionable mutations. The patient completed a course of concurrent chemoradiation with weekly carboplatin for AUC of 2 and paclitaxel 45 mg/M2.  He is status post 7 cycles.  Last dose was given February 28, 2020 The patient was supposed to have repeat CT scan of the chest before this visit but he did not show up for his appointment. I will try to arrange for the scan to be done in the next few days and will bring the patient back for follow-up visit next week for discussion of his scan results and recommendation regarding consolidation therapy. He was advised to call immediately if he has any other concerning symptoms in the interval. The patient voices understanding of current disease status and treatment options and is in agreement with the current care plan.  All questions were answered. The patient knows to call the clinic with any problems, questions or concerns. We can certainly see the patient much sooner if necessary.  Disclaimer: This note was dictated with voice recognition software.  Similar sounding words can inadvertently be transcribed and may not be corrected upon  review.

## 2020-03-28 ENCOUNTER — Other Ambulatory Visit: Payer: Self-pay

## 2020-03-28 ENCOUNTER — Encounter (HOSPITAL_COMMUNITY): Payer: Self-pay

## 2020-03-28 ENCOUNTER — Ambulatory Visit (HOSPITAL_COMMUNITY)
Admission: RE | Admit: 2020-03-28 | Discharge: 2020-03-28 | Disposition: A | Discharge status: Home / Self Care | Payer: Self-pay | Source: Ambulatory Visit | Attending: Physician Assistant | Admitting: Physician Assistant

## 2020-03-28 DIAGNOSIS — C3411 Malignant neoplasm of upper lobe, right bronchus or lung: Secondary | ICD-10-CM | POA: Insufficient documentation

## 2020-03-28 MED ORDER — IOHEXOL 300 MG/ML  SOLN
75.0000 mL | Freq: Once | INTRAMUSCULAR | Status: AC | PRN
  Administered 2020-03-28: 75 mL via INTRAVENOUS

## 2020-04-02 ENCOUNTER — Other Ambulatory Visit: Payer: Self-pay

## 2020-04-02 ENCOUNTER — Inpatient Hospital Stay (HOSPITAL_BASED_OUTPATIENT_CLINIC_OR_DEPARTMENT_OTHER): Payer: Self-pay | Admitting: Internal Medicine

## 2020-04-02 ENCOUNTER — Telehealth: Payer: Self-pay | Admitting: Radiation Oncology

## 2020-04-02 ENCOUNTER — Encounter: Payer: Self-pay | Admitting: Internal Medicine

## 2020-04-02 VITALS — BP 111/82 | HR 106 | Temp 96.9°F | Resp 17 | Ht 61.0 in | Wt 115.1 lb

## 2020-04-02 DIAGNOSIS — Z7189 Other specified counseling: Secondary | ICD-10-CM

## 2020-04-02 DIAGNOSIS — Z5112 Encounter for antineoplastic immunotherapy: Secondary | ICD-10-CM

## 2020-04-02 DIAGNOSIS — C3411 Malignant neoplasm of upper lobe, right bronchus or lung: Secondary | ICD-10-CM

## 2020-04-02 NOTE — Progress Notes (Signed)
DISCONTINUE ON PATHWAY REGIMEN - Non-Small Cell Lung     Administer weekly:     Paclitaxel      Carboplatin   **Always confirm dose/schedule in your pharmacy ordering system**  REASON: Continuation Of Treatment PRIOR TREATMENT: [id number]: Carboplatin AUC=2 + Paclitaxel 45 mg/m2 Weekly During Radiation TREATMENT RESPONSE: Partial Response (PR)  START ON PATHWAY REGIMEN - Non-Small Cell Lung     A cycle is every 28 days:     Durvalumab   **Always confirm dose/schedule in your pharmacy ordering system**  Patient Characteristics: Preoperative or Nonsurgical Candidate (Clinical Staging), Stage III - Nonsurgical Candidate (Nonsquamous and Squamous), PS = 0, 1 Therapeutic Status: Preoperative or Nonsurgical Candidate (Clinical Staging) AJCC T Category: cT3 AJCC N Category: cN2 AJCC M Category: cM0 AJCC 8 Stage Grouping: IIIB ECOG Performance Status: 1 Intent of Therapy: Curative Intent, Discussed with Patient

## 2020-04-02 NOTE — Progress Notes (Signed)
[place] Telephone:([phone]) [phone]   Fax:([phone]) [phone]  OFFICE PROGRESS NOTE  [name], NP [address] [address]  DIAGNOSIS: Stage IIIA (Tx, N2, M0) non-small cell lung cancer, adenocarcinoma diagnosed in August 2021 and presented with large right paratracheal soft tissue mass extending from the right apex to the right suprahilar and precarinal region.  Molecular studies by Guardant 360:  [id number], 31.9%, Olaparib  [id number], 68.6%. None   BRAFAmplification, High (+++) Plasma Copy Number: 5.0  PRIOR THERAPY: Concurrent chemoradiation with weekly carboplatin for AUC of 2 and paclitaxel 45 mg/M2.  First dose January 17, 2020.  Status post 7 cycles.  Last dose was given on February 28, 2020.  CURRENT THERAPY: Consolidation treatment with immunotherapy with Imfinzi 1500 mg IV every 4 weeks.  First dose April 09, 2020  INTERVAL HISTORY: [name] [name] 64 y.o. male returns to the clinic today for follow-up visit.  The patient is feeling fine today with no concerning complaints except for fatigue.  He denied having any chest pain, shortness of breath except with exertion with no cough or hemoptysis.  He denied having any fever or chills.  He has no nausea, vomiting, diarrhea or constipation.  He denied having any headache or visual changes.  He has no weight loss or night sweats.  He tolerated the previous course of concurrent chemoradiation fairly well.  The patient had repeat CT scan of the chest performed recently and he is here for evaluation and discussion of his scan results.   MEDICAL HISTORY: Past Medical History:  Diagnosis Date  . 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[place]) 12/25/2019  . COPD (chronic obstructive pulmonary disease) ([place]) 11/02/2007   Qualifier: Diagnosis of  By: Melvyn Novas MD, Christena Deem   . Hypertension 12/24/2019  . Iron deficiency anemia 08/23/2007   Qualifier: Diagnosis of  By: Jim Like      ALLERGIES:  has No Known  Allergies.  MEDICATIONS:  Current Outpatient Medications  Medication Sig Dispense Refill  . acetaminophen (TYLENOL) 500 MG tablet Take 500 mg by mouth every 6 (six) hours as needed for mild pain or moderate pain.    Marland Kitchen aspirin 81 MG EC tablet Take 1 tablet (81 mg total) by mouth daily. 30 tablet 11  . budesonide-formoterol (SYMBICORT) 80-4.5 MCG/ACT inhaler Inhale 2 puffs into the lungs 2 (two) times daily. (Patient taking differently: Inhale 2 puffs into the lungs 2 (two) times daily as needed (wheezing, SOB). ) 1 Inhaler 11  . feeding supplement (ENSURE ENLIVE / ENSURE PLUS) LIQD Take 237 mLs by mouth 2 (two) times daily between meals. 237 mL 12  . Multiple Vitamin (MULTIVITAMIN) tablet Take 1 tablet by mouth daily.    . prochlorperazine (COMPAZINE) 10 MG tablet Take 1 tablet (10 mg total) by mouth every 6 (six) hours as needed for nausea or vomiting. 30 tablet 0  . senna-docusate (SENOKOT-S) 8.6-50 MG tablet Take 1 tablet by mouth at bedtime.    Marland Kitchen SPIRIVA HANDIHALER 18 MCG inhalation capsule 1 capsule daily.    . sucralfate (CARAFATE) 1 g tablet Take 1 tablet (1 g total) by mouth 4 (four) times daily. Dissolve each tablet in 15 cc water before use. 120 tablet 2  . sulfamethoxazole-trimethoprim (BACTRIM DS) 800-160 MG tablet Take 1 tablet by mouth 2 (two) times daily. Drink at least 48 or more ounces of water while on this antibiotic 14 tablet 0  . VENTOLIN HFA 108 (90 Base) MCG/ACT inhaler Inhale 2 puffs into the lungs  every 4 (four) hours as needed.     No current facility-administered medications for this visit.    SURGICAL HISTORY:  Past Surgical History:  Procedure Laterality Date  . ENDOBRONCHIAL ULTRASOUND N/A 12/27/2019   Procedure: ENDOBRONCHIAL ULTRASOUND;  Surgeon: Laurin Coder, MD;  Location: [facility] END[facility];  Service: Endoscopy;  Laterality: N/A;  . FINE NEEDLE ASPIRATION  12/27/2019   Procedure: FINE NEEDLE ASPIRATION;  Surgeon: Laurin Coder, MD;  Location: [facility]  ENDOSCOPY;  Service: Endoscopy;;  . VIDEO BRONCHOSCOPY N/A 12/27/2019   Procedure: VIDEO BRONCHOSCOPY WITHOUT FLUORO;  Surgeon: Laurin Coder, MD;  Location: [facility] ENDOS[facility];  Service: Endoscopy;  Laterality: N/A;    REVIEW OF SYSTEMS:  Constitutional: positive for fatigue Eyes: negative Ears, nose, mouth, throat, and face: negative Respiratory: positive for dyspnea on exertion Cardiovascular: negative Gastrointestinal: negative Genitourinary:negative Integument/breast: negative Hematologic/lymphatic: negative Musculoskeletal:negative Neurological: negative Behavioral/Psych: negative Endocrine: negative Allergic/Immunologic: negative   PHYSICAL EXAMINATION: General appearance: alert, cooperative, fatigued and no distress Head: Normocephalic, without obvious abnormality, atraumatic Neck: no adenopathy, no JVD, supple, symmetrical, trachea midline and thyroid not enlarged, symmetric, no tenderness/mass/nodules Lymph nodes: Cervical, supraclavicular, and axillary nodes normal. Resp: clear to auscultation bilaterally Back: symmetric, no curvature. ROM normal. No CVA tenderness. Cardio: regular rate and rhythm, S1, S2 normal, no murmur, click, rub or gallop GI: soft, non-tender; bowel sounds normal; no masses,  no organomegaly Extremities: extremities normal, atraumatic, no cyanosis or edema Neurologic: Alert and oriented X 3, normal strength and tone. Normal symmetric reflexes. Normal coordination and gait  ECOG PERFORMANCE STATUS: 1 - Symptomatic but completely ambulatory  Blood pressure 111/82, pulse (!) 106, temperature (!) 96.9 F (36.1 C), temperature source Tympanic, resp. rate 17, height 5\' 1"  (1.549 m), weight 115 lb 1.6 oz (52.2 kg), SpO2 97 %.  LABORATORY DATA: Lab Results  Component Value Date   WBC 1.6 (L) 02/28/2020   HGB 10.5 (L) 02/28/2020   HCT 33.7 (L) 02/28/2020   MCV 71.1 (L) 02/28/2020   PLT 211 02/28/2020      Chemistry      Component Value Date/Time     NA 142 02/28/2020 0922   NA 144 08/03/2019 1527   K 4.0 02/28/2020 0922   CL 105 02/28/2020 0922   CO2 27 02/28/2020 0922   BUN 14 02/28/2020 0922   BUN 9 08/03/2019 1527   CREATININE 0.71 02/28/2020 0922      Component Value Date/Time   CALCIUM 9.9 02/28/2020 0922   ALKPHOS 60 02/28/2020 0922   AST 11 (L) 02/28/2020 0922   ALT 11 02/28/2020 0922   BILITOT <0.2 (L) 02/28/2020 0922       RADIOGRAPHIC STUDIES: CT Chest W Contrast  Result Date: 03/28/2020 CLINICAL DATA:  Metastatic non-small cell lung cancer, assess treatment response EXAM: CT CHEST WITH CONTRAST TECHNIQUE: Multidetector CT imaging of the chest was performed during intravenous contrast administration. CONTRAST:  20mL OMNIPAQUE IOHEXOL 300 MG/ML  SOLN COMPARISON:  12/24/2019 FINDINGS: Cardiovascular: No significant vascular findings. Normal heart size. No pericardial effusion. Mediastinum/Nodes: Significant interval decrease in size of a bulky soft tissue mass or lymphadenopathy centered about the right paratracheal station, measuring approximately 4.0 x 3.4 cm, previously 6.8 x 6.5 cm when measured similarly (series 2, image 32). Interval decrease in size of matted appearing pretracheal lymph nodes, measuring up to 1.7 x 1.0 cm, previously up to 2.9 x 2.1 cm (series 2, image 57). Thyroid gland, trachea, and esophagus demonstrate no significant findings. Lungs/Pleura: There is a redemonstrated, relatively thick-walled  multi cystic lesion of the anterior left pulmonary apex measuring up to 6.2 x 5.8 cm, not significantly changed (series 7, image 39). Mild to moderate centrilobular emphysema. No pleural effusion or pneumothorax. Upper Abdomen: No acute abnormality. Musculoskeletal: No chest wall mass or suspicious bone lesions identified. IMPRESSION: 1. Significant interval decrease in size of a bulky soft tissue mass or lymphadenopathy centered about the right paratracheal station, as well as matted appearing pretracheal lymph  nodes, findings consistent with treatment response of nodal metastatic disease. There is no pulmonary primary lesion appreciated, as on prior examinations. 2. Unchanged, relatively thick-walled multi cystic lesion of the anterior left pulmonary apex, likely incidental sequelae of prior infection. 3. Emphysema (ICD10-J43.9). Electronically Signed   By: Eddie Candle M.D.   On: 03/28/2020 13:23   VAS [place] UPPER EXTREMITY VENOUS DUPLEX  Result Date: 03/06/2020 UPPER VENOUS STUDY  Indications: S/P carboplatin extravasation 02-28-2020 with redness and swelling of RUE Risk Factors: Cancer Non-small cell lung cancer, adenocarcinoma. Comparison Study: No prior studies. Performing Technologist: Darlin Coco  Examination Guidelines: A complete evaluation includes B-mode imaging, spectral Doppler, color Doppler, and power Doppler as needed of all accessible portions of each vessel. Bilateral testing is considered an integral part of a complete examination. Limited examinations for reoccurring indications may be performed as noted.  Right Findings: +----------+------------+---------+-----------+----------+-------+ RIGHT     CompressiblePhasicitySpontaneousPropertiesSummary +----------+------------+---------+-----------+----------+-------+ IJV           Full       Yes       Yes                      +----------+------------+---------+-----------+----------+-------+ Subclavian    Full       Yes       Yes                      +----------+------------+---------+-----------+----------+-------+ Axillary      Full       Yes       Yes                      +----------+------------+---------+-----------+----------+-------+ Brachial      Full                                          +----------+------------+---------+-----------+----------+-------+ Radial        Full                                          +----------+------------+---------+-----------+----------+-------+ Ulnar         Full                                           +----------+------------+---------+-----------+----------+-------+ Cephalic      Full                                          +----------+------------+---------+-----------+----------+-------+ Basilic       Full                                          +----------+------------+---------+-----------+----------+-------+  Left Findings: +----------+------------+---------+-----------+----------+-------+ LEFT      CompressiblePhasicitySpontaneousPropertiesSummary +----------+------------+---------+-----------+----------+-------+ Subclavian    Full       Yes       Yes                      +----------+------------+---------+-----------+----------+-------+  Summary:  Right: No evidence of deep vein thrombosis in the upper extremity. No evidence of superficial vein thrombosis in the upper extremity.  Left: No evidence of thrombosis in the subclavian.  *See table(s) above for measurements and observations.  Diagnosing physician: Curt Jews MD Electronically signed by Curt Jews MD on 03/06/2020 at 4:24:56 PM.    Final     ASSESSMENT AND PLAN: This is a very pleasant 64 years old African-American male recently diagnosed with stage IIIA (Tx, N2, M0) non-small cell lung cancer, adenocarcinoma presented with large right paratracheal soft tissue mass extending from the right apex to the right suprahilar and precarinal region diagnosed in August 2021. Molecular studies by [employer] 360 showed no actionable mutations. The patient completed a course of concurrent chemoradiation with weekly carboplatin for AUC of 2 and paclitaxel 45 mg/M2.  He is status post 7 cycles.  Last dose was given February 28, 2020 The patient had repeat CT scan of the chest performed recently.  I personally and independently reviewed the scan images and discussed the result and showed the images to the patient today. His scan showed significant improvement of his disease. I  discussed with the patient the option of consolidation treatment with immunotherapy with Imfinzi 1500 mg IV every 4 weeks.  He was also given the option of observation and close monitoring.  The patient is interested in proceeding with the treatment. He is expected to start the first cycle of this treatment on April 09, 2020. I discussed with the patient the adverse effect of this treatment including but not limited to immunotherapy mediated skin rash, diarrhea, inflammation of the lung, kidney, liver, thyroid and other endocrine dysfunction. He will come back for follow-up visit in 5 weeks for evaluation with the start of cycle #2. He was advised to call immediately if he has any concerning symptoms in the interval. The patient voices understanding of current disease status and treatment options and is in agreement with the current care plan.  All questions were answered. The patient knows to call the clinic with any problems, questions or concerns. We can certainly see the patient much sooner if necessary.  Disclaimer: This note was dictated with voice recognition software. Similar sounding words can inadvertently be transcribed and may not be corrected upon review.

## 2020-04-02 NOTE — Telephone Encounter (Signed)
  Radiation Oncology         [phone]) [phone] ________________________________  Name: [name] [name] MRN: [id number]  Date of Service: 04/02/2020  DOB: [birth year]  Post Treatment Telephone Note  Diagnosis:  Stage IIIB, cT3N2M0, NSCLC, adenocarcinoma of the RUL  Interval Since Last Radiation: 5 weeks   01/17/20-03/02/20:  The RUL target was tread to 52 Gy in 26 fractions. His treatment had to be replanned and reconstructed so he received 8 Gy in 4 fractions, followed by a 6 Gy boost in 3 fractions.   Narrative:  The patient was contacted today for routine follow-up. During treatment he did very well with radiotherapy and did not have significant desquamation. He reports he is doing better since radiation completed. He is going to have the consolidation immunotherapy and this will start next week and then continue every 4 weeks. His swallowing has improved since radiation and he feels like his hoarseness has improved very mildly.   Impression/Plan: 1. Stage IIIB, cT3N2M0, NSCLC, adenocarcinoma of the RUL. The patient has been doing well since completion of radiotherapy. We discussed that we would be happy to continue to follow him as needed, but he will also continue to follow up with Dr. Julien Nordmann in medical oncology.     Carola Rhine, PAC

## 2020-04-05 ENCOUNTER — Telehealth: Payer: Self-pay | Admitting: Medical Oncology

## 2020-04-05 NOTE — Telephone Encounter (Signed)
Appt confirmed

## 2020-04-06 NOTE — Progress Notes (Signed)
..  The following Medication: Imfinzi has been approved thru [employer] as Assistance Program. Enrollment period is 04/06/2020 to 04/05/2020.  Assistance ID: [id number]. Reason for Assistance: Self Pay  First DOS: 04/09/2020

## 2020-04-06 NOTE — Progress Notes (Signed)
Pharmacist Chemotherapy Monitoring - Initial Assessment    Anticipated start date: 04/09/20  Regimen:   Are orders appropriate based on the patients diagnosis, regimen, and cycle? Yes  Does the plan date match the patients scheduled date? Yes  Is the sequencing of drugs appropriate? Yes  Are the premedications appropriate for the patients regimen? Yes  Prior Authorization for treatment is: Pending o If applicable, is the correct biosimilar selected based on the patient's insurance? not applicable  Organ Function and Labs:  Are dose adjustments needed based on the patient's renal function, hepatic function, or hematologic function? No  Are appropriate labs ordered prior to the start of patient's treatment? Yes  Other organ system assessment, if indicated: N/A  The following baseline labs, if indicated, have been ordered: durvalumab: baseline TSH +/- T4  Dose Assessment:  Are the drug doses appropriate? Yes  Are the following correct: o Drug concentrations Yes o IV fluid compatible with drug Yes o Administration routes Yes o Timing of therapy Yes  If applicable, does the patient have documented access for treatment and/or plans for port-a-cath placement? no  If applicable, have lifetime cumulative doses been properly documented and assessed? not applicable Lifetime Dose Tracking   Carboplatin: 1,330 mg = 0.01 % of the maximum lifetime dose of 999,999,999 mg  o   Toxicity Monitoring/Prevention:  The patient has the following take home antiemetics prescribed: Prochlorperazine  The patient has the following take home medications prescribed: N/A  Medication allergies and previous infusion related reactions, if applicable, have been reviewed and addressed. Yes  The patient's current medication list has been assessed for drug-drug interactions with their chemotherapy regimen. no significant drug-drug interactions were identified on review.  Order Review:  Are the  treatment plan orders signed? No  Is the patient scheduled to see a provider prior to their treatment? Yes  I verify that I have reviewed each item in the above checklist and answered each question accordingly.  Romualdo Bolk [facility] 04/06/2020 12:37 PM

## 2020-04-09 ENCOUNTER — Other Ambulatory Visit: Payer: Self-pay

## 2020-04-09 ENCOUNTER — Encounter: Payer: Self-pay | Admitting: Internal Medicine

## 2020-04-09 ENCOUNTER — Inpatient Hospital Stay: Payer: Self-pay

## 2020-04-09 ENCOUNTER — Inpatient Hospital Stay (HOSPITAL_BASED_OUTPATIENT_CLINIC_OR_DEPARTMENT_OTHER): Payer: Self-pay | Admitting: Internal Medicine

## 2020-04-09 VITALS — BP 105/67 | HR 92 | Temp 97.8°F | Resp 17 | Ht 61.0 in | Wt 115.4 lb

## 2020-04-09 DIAGNOSIS — C3411 Malignant neoplasm of upper lobe, right bronchus or lung: Secondary | ICD-10-CM

## 2020-04-09 DIAGNOSIS — Z5112 Encounter for antineoplastic immunotherapy: Secondary | ICD-10-CM

## 2020-04-09 DIAGNOSIS — Z23 Encounter for immunization: Secondary | ICD-10-CM

## 2020-04-09 LAB — CBC WITH DIFFERENTIAL (CANCER CENTER ONLY)
Abs Immature Granulocytes: 0.03 10*3/uL (ref 0.00–0.07)
Basophils Absolute: 0 10*3/uL (ref 0.0–0.1)
Basophils Relative: 1 %
Eosinophils Absolute: 0.1 10*3/uL (ref 0.0–0.5)
Eosinophils Relative: 3 %
HCT: 32.4 % — ABNORMAL LOW (ref 39.0–52.0)
Hemoglobin: 9.8 g/dL — ABNORMAL LOW (ref 13.0–17.0)
Immature Granulocytes: 1 %
Lymphocytes Relative: 17 %
Lymphs Abs: 0.7 10*3/uL (ref 0.7–4.0)
MCH: 23.3 pg — ABNORMAL LOW (ref 26.0–34.0)
MCHC: 30.2 g/dL (ref 30.0–36.0)
MCV: 77.1 fL — ABNORMAL LOW (ref 80.0–100.0)
Monocytes Absolute: 0.7 10*3/uL (ref 0.1–1.0)
Monocytes Relative: 16 %
Neutro Abs: 2.7 10*3/uL (ref 1.7–7.7)
Neutrophils Relative %: 62 %
Platelet Count: 304 10*3/uL (ref 150–400)
RBC: 4.2 MIL/uL — ABNORMAL LOW (ref 4.22–5.81)
RDW: 24.2 % — ABNORMAL HIGH (ref 11.5–15.5)
WBC Count: 4.2 10*3/uL (ref 4.0–10.5)
nRBC: 0 % (ref 0.0–0.2)

## 2020-04-09 LAB — CMP (CANCER CENTER ONLY)
ALT: <6 U/L (ref 0–44)
AST: 10 U/L — ABNORMAL LOW (ref 15–41)
Albumin: 3.1 g/dL — ABNORMAL LOW (ref 3.5–5.0)
Alkaline Phosphatase: 66 U/L (ref 38–126)
Anion gap: 9 (ref 5–15)
BUN: 11 mg/dL (ref 8–23)
CO2: 24 mmol/L (ref 22–32)
Calcium: 9.3 mg/dL (ref 8.9–10.3)
Chloride: 106 mmol/L (ref 98–111)
Creatinine: 0.73 mg/dL (ref 0.61–1.24)
GFR, Estimated: >60 mL/min (ref >60)
Glucose, Bld: 93 mg/dL (ref 70–99)
Potassium: 3.9 mmol/L (ref 3.5–5.1)
Sodium: 139 mmol/L (ref 135–145)
Total Bilirubin: 0.2 mg/dL — ABNORMAL LOW (ref 0.3–1.2)
Total Protein: 7 g/dL (ref 6.5–8.1)

## 2020-04-09 LAB — TSH: TSH: 0.745 u[IU]/mL (ref 0.320–4.118)

## 2020-04-09 MED ORDER — INFLUENZA VAC A&B SA ADJ QUAD 0.5 ML IM PRSY: 0.5000 mL | PREFILLED_SYRINGE | Freq: Once | INTRAMUSCULAR | Status: DC

## 2020-04-09 MED ORDER — INFLUENZA VAC SPLIT QUAD 0.5 ML IM SUSY
PREFILLED_SYRINGE | INTRAMUSCULAR | Status: AC
  Filled 2020-04-09: qty 0.5

## 2020-04-09 MED ORDER — SODIUM CHLORIDE 0.9 % IV SOLN
Freq: Once | INTRAVENOUS | Status: AC
  Filled 2020-04-09: qty 250

## 2020-04-09 MED ORDER — SODIUM CHLORIDE 0.9 % IV SOLN
1500.0000 mg | Freq: Once | INTRAVENOUS | Status: AC
  Administered 2020-04-09: 1500 mg via INTRAVENOUS
  Filled 2020-04-09: qty 30

## 2020-04-09 MED ORDER — INFLUENZA VAC SPLIT QUAD 0.5 ML IM SUSY
0.5000 mL | PREFILLED_SYRINGE | Freq: Once | INTRAMUSCULAR | Status: AC
  Administered 2020-04-09: 0.5 mL via INTRAMUSCULAR

## 2020-04-09 NOTE — Patient Instructions (Addendum)
[facility] Discharge Instructions for Patients Receiving Chemotherapy  Today you received the following chemotherapy agents: Durvalumab (Imfinzi)  To help prevent nausea and vomiting after your treatment, we encourage you to take your nausea medication  as prescribed.    If you develop nausea and vomiting that is not controlled by your nausea medication, call the clinic.   BELOW ARE SYMPTOMS THAT SHOULD BE REPORTED IMMEDIATELY:  *FEVER GREATER THAN 100.5 F  *CHILLS WITH OR WITHOUT FEVER  NAUSEA AND VOMITING THAT IS NOT CONTROLLED WITH YOUR NAUSEA MEDICATION  *UNUSUAL SHORTNESS OF BREATH  *UNUSUAL BRUISING OR BLEEDING  TENDERNESS IN MOUTH AND THROAT WITH OR WITHOUT PRESENCE OF ULCERS  *URINARY PROBLEMS  *BOWEL PROBLEMS  UNUSUAL RASH Items with * indicate a potential emergency and should be followed up as soon as possible.  Feel free to call the clinic should you have any questions or concerns. The clinic phone number is [phone].  Please show the [place] at check-in to the Emergency Department and triage nurse.    Durvalumab injection What is this medicine? DURVALUMAB (dur VAL ue mab) is a monoclonal antibody. It is used to treat urothelial cancer and lung cancer. This medicine may be used for other purposes; ask your health care provider or pharmacist if you have questions. COMMON BRAND NAME(S): IMFINZI What should I tell my health care provider before I take this medicine? They need to know if you have any of these conditions:  diabetes  immune system problems  infection  inflammatory bowel disease  kidney disease  liver disease  lung or breathing disease  lupus  organ transplant  stomach or intestine problems  thyroid disease  an unusual or allergic reaction to durvalumab, other medicines, foods, dyes, or preservatives  pregnant or trying to get pregnant  breast-feeding How should I use this medicine? This  medicine is for infusion into a vein. It is given by a health care professional in a hospital or clinic setting. A special MedGuide will be given to you before each treatment. Be sure to read this information carefully each time. Talk to your pediatrician regarding the use of this medicine in children. Special care may be needed. Overdosage: If you think you have taken too much of this medicine contact a poison control center or emergency room at once. NOTE: This medicine is only for you. Do not share this medicine with others. What if I miss a dose? It is important not to miss your dose. Call your doctor or health care professional if you are unable to keep an appointment. What may interact with this medicine? Interactions have not been studied. This list may not describe all possible interactions. Give your health care provider a list of all the medicines, herbs, non-prescription drugs, or dietary supplements you use. Also tell them if you smoke, drink alcohol, or use illegal drugs. Some items may interact with your medicine. What should I watch for while using this medicine? This drug may make you feel generally unwell. Continue your course of treatment even though you feel ill unless your doctor tells you to stop. You may need blood work done while you are taking this medicine. Do not become pregnant while taking this medicine or for 3 months after stopping it. Women should inform their doctor if they wish to become pregnant or think they might be pregnant. There is a potential for serious side effects to an unborn child. Talk to your health care professional or pharmacist for  more information. Do not breast-feed an infant while taking this medicine or for 3 months after stopping it. What side effects may I notice from receiving this medicine? Side effects that you should report to your doctor or health care professional as soon as possible:  allergic reactions like skin rash, itching or hives,  swelling of the face, lips, or tongue  black, tarry stools  bloody or watery diarrhea  breathing problems  change in emotions or moods  change in sex drive  changes in vision  chest pain or chest tightness  chills  confusion  cough  facial flushing  fever  headache  signs and symptoms of high blood sugar such as dizziness; dry mouth; dry skin; fruity breath; nausea; stomach pain; increased hunger or thirst; increased urination  signs and symptoms of liver injury like dark yellow or brown urine; general ill feeling or flu-like symptoms; light-colored stools; loss of appetite; nausea; right upper belly pain; unusually weak or tired; yellowing of the eyes or skin  stomach pain  trouble passing urine or change in the amount of urine  weight gain or weight loss Side effects that usually do not require medical attention (report these to your doctor or health care professional if they continue or are bothersome):  bone pain  constipation  loss of appetite  muscle pain  nausea  swelling of the ankles, feet, hands  tiredness This list may not describe all possible side effects. Call your doctor for medical advice about side effects. You may report side effects to FDA at 1-800-FDA-1088. Where should I keep my medicine? This drug is given in a hospital or clinic and will not be stored at home. NOTE: This sheet is a summary. It may not cover all possible information. If you have questions about this medicine, talk to your doctor, pharmacist, or health care provider.  2020 Elsevier/Gold Standard (2016-07-15 19:25:04)

## 2020-04-09 NOTE — Progress Notes (Signed)
[place] [phone]   [phone]  OFFICE PROGRESS NOTE  [name], NP [address] [address]  DIAGNOSIS: Stage IIIA (Tx, N2, M0) non-small cell lung cancer, adenocarcinoma diagnosed in August 2021 and presented with large right paratracheal soft tissue mass extending from the right apex to the right suprahilar and precarinal region.  Molecular studies by [employer]:  [id number], 31.9%, Olaparib  [id number], 68.6%. None   BRAFAmplification, High (+++) Plasma Copy Number: 5.0  PRIOR THERAPY: Concurrent chemoradiation with weekly carboplatin for AUC of 2 and paclitaxel 45 mg/M2.  First dose January 17, 2020.  Status post 7 cycles.  Last dose was given on February 28, 2020.  CURRENT THERAPY: Consolidation treatment with immunotherapy with Imfinzi 1500 mg IV every 4 weeks.  First dose April 09, 2020  INTERVAL HISTORY: [name] [name] 64 y.o. male returns to the clinic today for evaluation before starting the first cycle of his consolidation treatment with Imfinzi.  The patient is feeling fine today with no concerning complaints.  He denied having any chest pain, shortness of breath, cough or hemoptysis.  He denied having any fever or chills.  He has no nausea, vomiting, diarrhea or constipation.  He has no headache or visual changes.   MEDICAL HISTORY: Past Medical History:  Diagnosis Date  . 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[place]) 12/25/2019  . COPD (chronic obstructive pulmonary disease) ([place]) 11/02/2007   Qualifier: Diagnosis of  By: Melvyn Novas MD, Christena Deem   . Hypertension 12/24/2019  . Iron deficiency anemia 08/23/2007   Qualifier: Diagnosis of  By: Jim Like      ALLERGIES:  has No Known Allergies.  MEDICATIONS:  Current Outpatient Medications  Medication Sig Dispense Refill  . acetaminophen (TYLENOL) 500 MG tablet Take 500 mg by mouth every 6 (six) hours as needed for mild pain or moderate pain.    Marland Kitchen aspirin 81 MG  EC tablet Take 1 tablet (81 mg total) by mouth daily. 30 tablet 11  . budesonide-formoterol (SYMBICORT) 80-4.5 MCG/ACT inhaler Inhale 2 puffs into the lungs 2 (two) times daily. (Patient taking differently: Inhale 2 puffs into the lungs 2 (two) times daily as needed (wheezing, SOB). ) 1 Inhaler 11  . feeding supplement (ENSURE ENLIVE / ENSURE PLUS) LIQD Take 237 mLs by mouth 2 (two) times daily between meals. 237 mL 12  . Multiple Vitamin (MULTIVITAMIN) tablet Take 1 tablet by mouth daily.    . prochlorperazine (COMPAZINE) 10 MG tablet Take 1 tablet (10 mg total) by mouth every 6 (six) hours as needed for nausea or vomiting. 30 tablet 0  . senna-docusate (SENOKOT-S) 8.6-50 MG tablet Take 1 tablet by mouth at bedtime.    Marland Kitchen SPIRIVA HANDIHALER 18 MCG inhalation capsule 1 capsule daily.    . sucralfate (CARAFATE) 1 g tablet Take 1 tablet (1 g total) by mouth 4 (four) times daily. Dissolve each tablet in 15 cc water before use. 120 tablet 2  . sulfamethoxazole-trimethoprim (BACTRIM DS) 800-160 MG tablet Take 1 tablet by mouth 2 (two) times daily. Drink at least 48 or more ounces of water while on this antibiotic 14 tablet 0  . VENTOLIN HFA 108 (90 Base) MCG/ACT inhaler Inhale 2 puffs into the lungs every 4 (four) hours as needed.     No current facility-administered medications for this visit.    SURGICAL HISTORY:  Past Surgical History:  Procedure Laterality Date  . ENDOBRONCHIAL ULTRASOUND N/A 12/27/2019   Procedure: ENDOBRONCHIAL ULTRASOUND;  Surgeon:  Laurin Coder, MD;  Location: [facility];  Service: Endoscopy;  Laterality: N/A;  . FINE NEEDLE ASPIRATION  12/27/2019   Procedure: FINE NEEDLE ASPIRATION;  Surgeon: Laurin Coder, MD;  Location: [facility];  Service: Endoscopy;;  . VIDEO BRONCHOSCOPY N/A 12/27/2019   Procedure: VIDEO BRONCHOSCOPY WITHOUT FLUORO;  Surgeon: Laurin Coder, MD;  Location: [facility];  Service: Endoscopy;  Laterality: N/A;    REVIEW OF SYSTEMS:  A  comprehensive review of systems was negative.   PHYSICAL EXAMINATION: General appearance: alert, cooperative, fatigued and no distress Head: Normocephalic, without obvious abnormality, atraumatic Neck: no adenopathy, no JVD, supple, symmetrical, trachea midline and thyroid not enlarged, symmetric, no tenderness/mass/nodules Lymph nodes: Cervical, supraclavicular, and axillary nodes normal. Resp: clear to auscultation bilaterally Back: symmetric, no curvature. ROM normal. No CVA tenderness. Cardio: regular rate and rhythm, S1, S2 normal, no murmur, click, rub or gallop GI: soft, non-tender; bowel sounds normal; no masses,  no organomegaly Extremities: extremities normal, atraumatic, no cyanosis or edema  ECOG PERFORMANCE STATUS: 1 - Symptomatic but completely ambulatory  Blood pressure 105/67, pulse 92, temperature 97.8 F (36.6 C), temperature source Tympanic, resp. rate 17, SpO2 100 %.  LABORATORY DATA: Lab Results  Component Value Date   WBC 4.2 04/09/2020   HGB 9.8 (L) 04/09/2020   HCT 32.4 (L) 04/09/2020   MCV 77.1 (L) 04/09/2020   PLT 304 04/09/2020      Chemistry      Component Value Date/Time   NA 142 02/28/2020 0922   NA 144 08/03/2019 1527   K 4.0 02/28/2020 0922   CL 105 02/28/2020 0922   CO2 27 02/28/2020 0922   BUN 14 02/28/2020 0922   BUN 9 08/03/2019 1527   CREATININE 0.71 02/28/2020 0922      Component Value Date/Time   CALCIUM 9.9 02/28/2020 0922   ALKPHOS 60 02/28/2020 0922   AST 11 (L) 02/28/2020 0922   ALT 11 02/28/2020 0922   BILITOT <0.2 (L) 02/28/2020 0922       RADIOGRAPHIC STUDIES: CT Chest W Contrast  Result Date: 03/28/2020 CLINICAL DATA:  Metastatic non-small cell lung cancer, assess treatment response EXAM: CT CHEST WITH CONTRAST TECHNIQUE: Multidetector CT imaging of the chest was performed during intravenous contrast administration. CONTRAST:  65mL OMNIPAQUE IOHEXOL 300 MG/ML  SOLN COMPARISON:  12/24/2019 FINDINGS: Cardiovascular: No  significant vascular findings. Normal heart size. No pericardial effusion. Mediastinum/Nodes: Significant interval decrease in size of a bulky soft tissue mass or lymphadenopathy centered about the right paratracheal station, measuring approximately 4.0 x 3.4 cm, previously 6.8 x 6.5 cm when measured similarly (series 2, image 32). Interval decrease in size of matted appearing pretracheal lymph nodes, measuring up to 1.7 x 1.0 cm, previously up to 2.9 x 2.1 cm (series 2, image 57). Thyroid gland, trachea, and esophagus demonstrate no significant findings. Lungs/Pleura: There is a redemonstrated, relatively thick-walled multi cystic lesion of the anterior left pulmonary apex measuring up to 6.2 x 5.8 cm, not significantly changed (series 7, image 39). Mild to moderate centrilobular emphysema. No pleural effusion or pneumothorax. Upper Abdomen: No acute abnormality. Musculoskeletal: No chest wall mass or suspicious bone lesions identified. IMPRESSION: 1. Significant interval decrease in size of a bulky soft tissue mass or lymphadenopathy centered about the right paratracheal station, as well as matted appearing pretracheal lymph nodes, findings consistent with treatment response of nodal metastatic disease. There is no pulmonary primary lesion appreciated, as on prior examinations. 2. Unchanged, relatively thick-walled multi cystic lesion of the  anterior left pulmonary apex, likely incidental sequelae of prior infection. 3. Emphysema (ICD10-J43.9). Electronically Signed   By: Eddie Candle M.D.   On: 03/28/2020 13:23    ASSESSMENT AND PLAN: This is a very pleasant 64 years old African-American male recently diagnosed with stage IIIA (Tx, N2, M0) non-small cell lung cancer, adenocarcinoma presented with large right paratracheal soft tissue mass extending from the right apex to the right suprahilar and precarinal region diagnosed in August 2021. Molecular studies by [employer] showed no actionable mutations. The  patient completed a course of concurrent chemoradiation with weekly carboplatin for AUC of 2 and paclitaxel 45 mg/M2.  He is status post 7 cycles.  Last dose was given February 28, 2020 The patient tolerated the previous treatment well and he had partial response. He is here today for evaluation before starting the first dose of consolidation treatment with Imfinzi every 4 weeks. The patient is feeling fine and I recommended for him to proceed with cycle number one of his consolidation treatment as planned. He will come back for follow-up visit in 4 weeks for evaluation before the next cycle of his treatment. He was advised to call immediately if he has any concerning symptoms in the interval. The patient voices understanding of current disease status and treatment options and is in agreement with the current care plan.  All questions were answered. The patient knows to call the clinic with any problems, questions or concerns. We can certainly see the patient much sooner if necessary.  Disclaimer: This note was dictated with voice recognition software. Similar sounding words can inadvertently be transcribed and may not be corrected upon review.

## 2020-04-10 ENCOUNTER — Telehealth: Payer: Self-pay | Admitting: *Deleted

## 2020-04-10 NOTE — Telephone Encounter (Signed)
Called pt to see how he did with his treatment yest.  He reports doing OK & denies any problems.  He states that he knows how to reach [place] & will call if necessary.

## 2020-04-10 NOTE — Telephone Encounter (Signed)
-----   Message from Jolaine Click, RN sent at 04/09/2020  2:54 PM EST ----- Regarding: First Time Imfinzi - Dr. Julien Nordmann Patient First Time Imfinzi - Dr. Julien Nordmann Patient

## 2020-04-24 ENCOUNTER — Telehealth: Payer: Self-pay

## 2020-04-24 NOTE — Telephone Encounter (Signed)
Pt and Ms. Bass LM wanting to know if it is recommended for him to receive the COVID booster.   Discussed with Dr. Julien Nordmann and there are no known reasons for pt not to receive the booster.  Called back and spoke with Ms. Elgie Congo and advised as indicated. She expressed understanding of this information.

## 2020-04-29 NOTE — Progress Notes (Signed)
  Radiation Oncology         [phone] ________________________________  Name: [name] [name] MRN: [id number]  Date: 03/08/2020  DOB: 1955/08/27  End of Treatment Note  Diagnosis:  Lung cancer     Indication for treatment::  curative       Radiation treatment dates:   01/17/20 - 03/02/20  Site/dose:   The patient was treated to the disease within the right lung initially to a dose of 60 Gy using a 3 field, IMRT technique. The patient then received a cone down boost treatment for an additional 6 Gy. This yielded a final total dose of 66 Gy.   Narrative: The patient tolerated radiation treatment relatively well.      Plan: The patient has completed radiation treatment. The patient will return to radiation oncology clinic for routine followup in one month. I advised the patient to call or return sooner if they have any questions or concerns related to their recovery or treatment. ________________________________  Jodelle Gross, M.D., Ph.D.

## 2020-05-07 ENCOUNTER — Inpatient Hospital Stay: Payer: Self-pay

## 2020-05-07 ENCOUNTER — Encounter: Payer: Self-pay | Admitting: Internal Medicine

## 2020-05-07 ENCOUNTER — Inpatient Hospital Stay: Payer: Self-pay | Attending: Internal Medicine | Admitting: Internal Medicine

## 2020-05-07 ENCOUNTER — Other Ambulatory Visit: Payer: Self-pay

## 2020-05-07 VITALS — BP 132/69 | HR 98 | Temp 97.8°F | Resp 18 | Ht 61.0 in | Wt 117.6 lb

## 2020-05-07 DIAGNOSIS — C3411 Malignant neoplasm of upper lobe, right bronchus or lung: Secondary | ICD-10-CM

## 2020-05-07 DIAGNOSIS — C3491 Malignant neoplasm of unspecified part of right bronchus or lung: Secondary | ICD-10-CM | POA: Insufficient documentation

## 2020-05-07 DIAGNOSIS — Z79899 Other long term (current) drug therapy: Secondary | ICD-10-CM | POA: Insufficient documentation

## 2020-05-07 DIAGNOSIS — Z5112 Encounter for antineoplastic immunotherapy: Secondary | ICD-10-CM | POA: Insufficient documentation

## 2020-05-07 LAB — CBC WITH DIFFERENTIAL (CANCER CENTER ONLY)
Abs Immature Granulocytes: 0.02 10*3/uL (ref 0.00–0.07)
Basophils Absolute: 0 10*3/uL (ref 0.0–0.1)
Basophils Relative: 1 %
Eosinophils Absolute: 0.1 10*3/uL (ref 0.0–0.5)
Eosinophils Relative: 3 %
HCT: 39.8 % (ref 39.0–52.0)
Hemoglobin: 12.2 g/dL — ABNORMAL LOW (ref 13.0–17.0)
Immature Granulocytes: 1 %
Lymphocytes Relative: 23 %
Lymphs Abs: 1 10*3/uL (ref 0.7–4.0)
MCH: 24.9 pg — ABNORMAL LOW (ref 26.0–34.0)
MCHC: 30.7 g/dL (ref 30.0–36.0)
MCV: 81.2 fL (ref 80.0–100.0)
Monocytes Absolute: 0.6 10*3/uL (ref 0.1–1.0)
Monocytes Relative: 14 %
Neutro Abs: 2.4 10*3/uL (ref 1.7–7.7)
Neutrophils Relative %: 58 %
Platelet Count: 277 10*3/uL (ref 150–400)
RBC: 4.9 MIL/uL (ref 4.22–5.81)
RDW: 17.8 % — ABNORMAL HIGH (ref 11.5–15.5)
WBC Count: 4.1 10*3/uL (ref 4.0–10.5)
nRBC: 0 % (ref 0.0–0.2)

## 2020-05-07 LAB — CMP (CANCER CENTER ONLY)
ALT: 9 U/L (ref 0–44)
AST: 12 U/L — ABNORMAL LOW (ref 15–41)
Albumin: 3.7 g/dL (ref 3.5–5.0)
Alkaline Phosphatase: 73 U/L (ref 38–126)
Anion gap: 9 (ref 5–15)
BUN: 11 mg/dL (ref 8–23)
CO2: 27 mmol/L (ref 22–32)
Calcium: 9.8 mg/dL (ref 8.9–10.3)
Chloride: 105 mmol/L (ref 98–111)
Creatinine: 0.86 mg/dL (ref 0.61–1.24)
GFR, Estimated: >60 mL/min (ref >60)
Glucose, Bld: 95 mg/dL (ref 70–99)
Potassium: 4 mmol/L (ref 3.5–5.1)
Sodium: 141 mmol/L (ref 135–145)
Total Bilirubin: 0.4 mg/dL (ref 0.3–1.2)
Total Protein: 7.7 g/dL (ref 6.5–8.1)

## 2020-05-07 LAB — TSH: TSH: 0.799 u[IU]/mL (ref 0.320–4.118)

## 2020-05-07 MED ORDER — SODIUM CHLORIDE 0.9 % IV SOLN
Freq: Once | INTRAVENOUS | Status: AC
Start: 2020-05-07 — End: 2020-05-07
  Filled 2020-05-07: qty 250

## 2020-05-07 MED ORDER — DURVALUMAB 500 MG/10ML IV SOLN
1500.0000 mg | Freq: Once | INTRAVENOUS | Status: AC
  Administered 2020-05-07: 1500 mg via INTRAVENOUS
  Filled 2020-05-07: qty 30

## 2020-05-07 NOTE — Progress Notes (Signed)
[place] [phone]   [phone]  OFFICE PROGRESS NOTE  [name], NP [address] [address]  DIAGNOSIS: Stage IIIA (Tx, N2, M0) non-small cell lung cancer, adenocarcinoma diagnosed in August 2021 and presented with large right paratracheal soft tissue mass extending from the right apex to the right suprahilar and precarinal region.  Molecular studies by [employer]:  [id number], 31.9%, Olaparib  [id number], 68.6%. None   BRAFAmplification, High (+++) Plasma Copy Number: 5.0  PRIOR THERAPY: Concurrent chemoradiation with weekly carboplatin for AUC of 2 and paclitaxel 45 mg/M2.  First dose January 17, 2020.  Status post 7 cycles.  Last dose was given on February 28, 2020.  CURRENT THERAPY: Consolidation treatment with immunotherapy with Imfinzi 1500 mg IV every 4 weeks.  First dose April 09, 2020.  Status post 1 cycle.  INTERVAL HISTORY: [name] [name] 64 y.o. male returns to the clinic today for follow-up visit.  The patient tolerated the first cycle of his treatment with immunotherapy fairly well.  He denied having any current chest pain, shortness of breath, cough or hemoptysis.  He has no fatigue or weakness.  He has no nausea, vomiting, diarrhea or constipation.  He denied having any headache or visual changes.  He is here today for evaluation before starting cycle #2 of his consolidation immunotherapy.    MEDICAL HISTORY: Past Medical History:  Diagnosis Date   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[place]) 12/25/2019   COPD (chronic obstructive pulmonary disease) ([place]) 11/02/2007   Qualifier: Diagnosis of  By: Melvyn Novas MD, Christena Deem    Hypertension 12/24/2019   Iron deficiency anemia 08/23/2007   Qualifier: Diagnosis of  By: Jim Like      ALLERGIES:  has No Known Allergies.  MEDICATIONS:  Current Outpatient Medications  Medication Sig Dispense Refill   acetaminophen (TYLENOL) 500 MG tablet Take 500 mg by mouth  every 6 (six) hours as needed for mild pain or moderate pain.     aspirin 81 MG EC tablet Take 1 tablet (81 mg total) by mouth daily. 30 tablet 11   budesonide-formoterol (SYMBICORT) 80-4.5 MCG/ACT inhaler Inhale 2 puffs into the lungs 2 (two) times daily. (Patient taking differently: Inhale 2 puffs into the lungs 2 (two) times daily as needed (wheezing, SOB). ) 1 Inhaler 11   feeding supplement (ENSURE ENLIVE / ENSURE PLUS) LIQD Take 237 mLs by mouth 2 (two) times daily between meals. 237 mL 12   Multiple Vitamin (MULTIVITAMIN) tablet Take 1 tablet by mouth daily.     prochlorperazine (COMPAZINE) 10 MG tablet Take 1 tablet (10 mg total) by mouth every 6 (six) hours as needed for nausea or vomiting. 30 tablet 0   senna-docusate (SENOKOT-S) 8.6-50 MG tablet Take 1 tablet by mouth at bedtime.     SPIRIVA HANDIHALER 18 MCG inhalation capsule 1 capsule daily.     sucralfate (CARAFATE) 1 g tablet Take 1 tablet (1 g total) by mouth 4 (four) times daily. Dissolve each tablet in 15 cc water before use. 120 tablet 2   sulfamethoxazole-trimethoprim (BACTRIM DS) 800-160 MG tablet Take 1 tablet by mouth 2 (two) times daily. Drink at least 48 or more ounces of water while on this antibiotic 14 tablet 0   VENTOLIN HFA 108 (90 Base) MCG/ACT inhaler Inhale 2 puffs into the lungs every 4 (four) hours as needed.     No current facility-administered medications for this visit.    SURGICAL HISTORY:  Past Surgical History:  Procedure  Laterality Date   ENDOBRONCHIAL ULTRASOUND N/A 12/27/2019   Procedure: ENDOBRONCHIAL ULTRASOUND;  Surgeon: Laurin Coder, MD;  Location: [facility];  Service: Endoscopy;  Laterality: N/A;   FINE NEEDLE ASPIRATION  12/27/2019   Procedure: FINE NEEDLE ASPIRATION;  Surgeon: Laurin Coder, MD;  Location: [facility];  Service: Endoscopy;;   VIDEO BRONCHOSCOPY N/A 12/27/2019   Procedure: VIDEO BRONCHOSCOPY WITHOUT FLUORO;  Surgeon: Laurin Coder, MD;  Location:  [facility];  Service: Endoscopy;  Laterality: N/A;    REVIEW OF SYSTEMS:  A comprehensive review of systems was negative.   PHYSICAL EXAMINATION: General appearance: alert, cooperative and no distress Head: Normocephalic, without obvious abnormality, atraumatic Neck: no adenopathy, no JVD, supple, symmetrical, trachea midline and thyroid not enlarged, symmetric, no tenderness/mass/nodules Lymph nodes: Cervical, supraclavicular, and axillary nodes normal. Resp: clear to auscultation bilaterally Back: symmetric, no curvature. ROM normal. No CVA tenderness. Cardio: regular rate and rhythm, S1, S2 normal, no murmur, click, rub or gallop GI: soft, non-tender; bowel sounds normal; no masses,  no organomegaly Extremities: extremities normal, atraumatic, no cyanosis or edema  ECOG PERFORMANCE STATUS: 1 - Symptomatic but completely ambulatory  Blood pressure 132/69, pulse 98, temperature 97.8 F (36.6 C), temperature source Tympanic, resp. rate 18, height 5\' 1"  (1.549 m), weight 117 lb 9.6 oz (53.3 kg), SpO2 97 %.  LABORATORY DATA: Lab Results  Component Value Date   WBC 4.1 05/07/2020   HGB 12.2 (L) 05/07/2020   HCT 39.8 05/07/2020   MCV 81.2 05/07/2020   PLT 277 05/07/2020      Chemistry      Component Value Date/Time   NA 141 05/07/2020 1145   NA 144 08/03/2019 1527   K 4.0 05/07/2020 1145   CL 105 05/07/2020 1145   CO2 27 05/07/2020 1145   BUN 11 05/07/2020 1145   BUN 9 08/03/2019 1527   CREATININE 0.86 05/07/2020 1145      Component Value Date/Time   CALCIUM 9.8 05/07/2020 1145   ALKPHOS 73 05/07/2020 1145   AST 12 (L) 05/07/2020 1145   ALT 9 05/07/2020 1145   BILITOT 0.4 05/07/2020 1145       RADIOGRAPHIC STUDIES: No results found.  ASSESSMENT AND PLAN: This is a very pleasant 64 years old African-American male recently diagnosed with stage IIIA (Tx, N2, M0) non-small cell lung cancer, adenocarcinoma presented with large right paratracheal soft tissue mass  extending from the right apex to the right suprahilar and precarinal region diagnosed in August 2021. Molecular studies by [employer] showed no actionable mutations. The patient completed a course of concurrent chemoradiation with weekly carboplatin for AUC of 2 and paclitaxel 45 mg/M2.  He is status post 7 cycles.  Last dose was given February 28, 2020 The patient tolerated the previous treatment well and he had partial response. The patient is currently undergoing consolidation treatment with immunotherapy with Imfinzi 1500 mg IV every 4 weeks status post 1 cycle. The patient continues to tolerate this treatment well with no concerning adverse effects. I recommended for him to proceed with cycle #2 today as planned. He will come back for follow-up visit in 4 weeks for evaluation before starting cycle #3. He was advised to call immediately if he has any concerning symptoms in the interval. The patient voices understanding of current disease status and treatment options and is in agreement with the current care plan.  All questions were answered. The patient knows to call the clinic with any problems, questions or concerns. We can  certainly see the patient much sooner if necessary.  Disclaimer: This note was dictated with voice recognition software. Similar sounding words can inadvertently be transcribed and may not be corrected upon review.

## 2020-05-07 NOTE — Patient Instructions (Signed)
[facility] Discharge Instructions for Patients Receiving Chemotherapy  Today you received the following chemotherapy agents: Durvalumab (Imfinzi)  To help prevent nausea and vomiting after your treatment, we encourage you to take your nausea medication  as prescribed.    If you develop nausea and vomiting that is not controlled by your nausea medication, call the clinic.   BELOW ARE SYMPTOMS THAT SHOULD BE REPORTED IMMEDIATELY:  *FEVER GREATER THAN 100.5 F  *CHILLS WITH OR WITHOUT FEVER  NAUSEA AND VOMITING THAT IS NOT CONTROLLED WITH YOUR NAUSEA MEDICATION  *UNUSUAL SHORTNESS OF BREATH  *UNUSUAL BRUISING OR BLEEDING  TENDERNESS IN MOUTH AND THROAT WITH OR WITHOUT PRESENCE OF ULCERS  *URINARY PROBLEMS  *BOWEL PROBLEMS  UNUSUAL RASH Items with * indicate a potential emergency and should be followed up as soon as possible.  Feel free to call the clinic should you have any questions or concerns. The clinic phone number is [phone].  Please show the [facility] at check-in to the Emergency Department and triage nurse.    Durvalumab injection What is this medicine? DURVALUMAB (dur VAL ue mab) is a monoclonal antibody. It is used to treat urothelial cancer and lung cancer. This medicine may be used for other purposes; ask your health care provider or pharmacist if you have questions. COMMON BRAND NAME(S): IMFINZI What should I tell my health care provider before I take this medicine? They need to know if you have any of these conditions:  diabetes  immune system problems  infection  inflammatory bowel disease  kidney disease  liver disease  lung or breathing disease  lupus  organ transplant  stomach or intestine problems  thyroid disease  an unusual or allergic reaction to durvalumab, other medicines, foods, dyes, or preservatives  pregnant or trying to get pregnant  breast-feeding How should I use this medicine? This  medicine is for infusion into a vein. It is given by a health care professional in a hospital or clinic setting. A special MedGuide will be given to you before each treatment. Be sure to read this information carefully each time. Talk to your pediatrician regarding the use of this medicine in children. Special care may be needed. Overdosage: If you think you have taken too much of this medicine contact a poison control center or emergency room at once. NOTE: This medicine is only for you. Do not share this medicine with others. What if I miss a dose? It is important not to miss your dose. Call your doctor or health care professional if you are unable to keep an appointment. What may interact with this medicine? Interactions have not been studied. This list may not describe all possible interactions. Give your health care provider a list of all the medicines, herbs, non-prescription drugs, or dietary supplements you use. Also tell them if you smoke, drink alcohol, or use illegal drugs. Some items may interact with your medicine. What should I watch for while using this medicine? This drug may make you feel generally unwell. Continue your course of treatment even though you feel ill unless your doctor tells you to stop. You may need blood work done while you are taking this medicine. Do not become pregnant while taking this medicine or for 3 months after stopping it. Women should inform their doctor if they wish to become pregnant or think they might be pregnant. There is a potential for serious side effects to an unborn child. Talk to your health care professional or pharmacist for  more information. Do not breast-feed an infant while taking this medicine or for 3 months after stopping it. What side effects may I notice from receiving this medicine? Side effects that you should report to your doctor or health care professional as soon as possible:  allergic reactions like skin rash, itching or hives,  swelling of the face, lips, or tongue  black, tarry stools  bloody or watery diarrhea  breathing problems  change in emotions or moods  change in sex drive  changes in vision  chest pain or chest tightness  chills  confusion  cough  facial flushing  fever  headache  signs and symptoms of high blood sugar such as dizziness; dry mouth; dry skin; fruity breath; nausea; stomach pain; increased hunger or thirst; increased urination  signs and symptoms of liver injury like dark yellow or brown urine; general ill feeling or flu-like symptoms; light-colored stools; loss of appetite; nausea; right upper belly pain; unusually weak or tired; yellowing of the eyes or skin  stomach pain  trouble passing urine or change in the amount of urine  weight gain or weight loss Side effects that usually do not require medical attention (report these to your doctor or health care professional if they continue or are bothersome):  bone pain  constipation  loss of appetite  muscle pain  nausea  swelling of the ankles, feet, hands  tiredness This list may not describe all possible side effects. Call your doctor for medical advice about side effects. You may report side effects to FDA at 1-800-FDA-1088. Where should I keep my medicine? This drug is given in a hospital or clinic and will not be stored at home. NOTE: This sheet is a summary. It may not cover all possible information. If you have questions about this medicine, talk to your doctor, pharmacist, or health care provider.  2020 Elsevier/Gold Standard (2016-07-15 19:25:04)

## 2020-05-08 ENCOUNTER — Telehealth: Payer: Self-pay | Admitting: Internal Medicine

## 2020-05-08 NOTE — Telephone Encounter (Signed)
Scheduled appts per 12/20 los. Unable to leave voicemail. Pt to get updated appt calendar at next visit per appt notes.

## 2020-06-04 ENCOUNTER — Inpatient Hospital Stay: Payer: Medicaid Other

## 2020-06-04 ENCOUNTER — Inpatient Hospital Stay: Payer: Medicaid Other | Admitting: Internal Medicine

## 2020-06-06 ENCOUNTER — Telehealth: Payer: Self-pay | Admitting: Physician Assistant

## 2020-06-06 NOTE — Telephone Encounter (Signed)
R/s appt from 1/17 - pt is aware of new appt date and time on 1/21

## 2020-06-06 NOTE — Progress Notes (Signed)
[facility] OFFICE PROGRESS NOTE  [name], NP [address] [place] [address]  DIAGNOSIS: Stage IIIA (Tx, N2, M0) non-small cell lung cancer, adenocarcinoma diagnosed in August 2021 and presented with large right paratracheal soft tissue mass extending from the right apex to the right suprahilar and precarinal region.  Molecular studies by [employer]:  [id number], 31.9%, Olaparib  [id number], 68.6%. None       BRAFAmplification, High (+++) Plasma Copy Number: 5.0  PRIOR THERAPY: Concurrent chemoradiation with weekly carboplatin for AUC of 2 and paclitaxel 45 mg/M2.  First dose January 17, 2020.  Status post 7 cycles.  Last dose was given on February 28, 2020.  CURRENT THERAPY: Consolidation treatment with immunotherapy with Imfinzi 1500 mg IV every 4 weeks.  First dose April 09, 2020.  Status post 2 cycles.  INTERVAL HISTORY: [name] [name] 65 y.o. male returns to the clinic for a follow up visit. The patient is feeling well today without any concerning complaints. The patient continues to tolerate treatment with immunotherapy with Imfinzi well without any adverse effects. Denies any fever, chills, night sweats, or weight loss. He actually gained 4 lbs since his last appointment. Denies any chest pain or hemoptysis. He reports his baseline occasional cough which sometimes produces clear sputum. He also reports shortness of breath with exertion. Denies any nausea, vomiting, diarrhea, or constipation. Denies any headache or visual changes. Denies any rashes or skin changes. The patient is here today for evaluation prior to starting cycle #3   MEDICAL HISTORY: Past Medical History:  Diagnosis Date  . 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[facility]) 12/25/2019  . COPD (chronic obstructive pulmonary disease) ([place]) 11/02/2007   Qualifier: Diagnosis of  By: Melvyn Novas MD, Christena Deem   . Hypertension 12/24/2019  . Iron deficiency anemia 08/23/2007   Qualifier: Diagnosis of  By: Jim Like      ALLERGIES:  has No Known Allergies.  MEDICATIONS:  Current Outpatient Medications  Medication Sig Dispense Refill  . acetaminophen (TYLENOL) 500 MG tablet Take 500 mg by mouth every 6 (six) hours as needed for mild pain or moderate pain.    Marland Kitchen aspirin 81 MG EC tablet Take 1 tablet (81 mg total) by mouth daily. 30 tablet 11  . budesonide-formoterol (SYMBICORT) 80-4.5 MCG/ACT inhaler Inhale 2 puffs into the lungs 2 (two) times daily. (Patient taking differently: Inhale 2 puffs into the lungs 2 (two) times daily as needed (wheezing, SOB).) 1 Inhaler 11  . feeding supplement (ENSURE ENLIVE / ENSURE PLUS) LIQD Take 237 mLs by mouth 2 (two) times daily between meals. 237 mL 12  . Multiple Vitamin (MULTIVITAMIN) tablet Take 1 tablet by mouth daily.    . prochlorperazine (COMPAZINE) 10 MG tablet Take 1 tablet (10 mg total) by mouth every 6 (six) hours as needed for nausea or vomiting. 30 tablet 0  . senna-docusate (SENOKOT-S) 8.6-50 MG tablet Take 1 tablet by mouth at bedtime.    Marland Kitchen SPIRIVA HANDIHALER 18 MCG inhalation capsule 1 capsule daily.    . sucralfate (CARAFATE) 1 g tablet Take 1 tablet (1 g total) by mouth 4 (four) times daily. Dissolve each tablet in 15 cc water before use. 120 tablet 2  . VENTOLIN HFA 108 (90 Base) MCG/ACT inhaler Inhale 2 puffs into the lungs every 4 (four) hours as needed.     No current facility-administered medications for this visit.    SURGICAL HISTORY:  Past Surgical History:  Procedure Laterality Date  . ENDOBRONCHIAL ULTRASOUND N/A 12/27/2019  Procedure: ENDOBRONCHIAL ULTRASOUND;  Surgeon: Laurin Coder, MD;  Location: [facility];  Service: Endoscopy;  Laterality: N/A;  . FINE NEEDLE ASPIRATION  12/27/2019   Procedure: FINE NEEDLE ASPIRATION;  Surgeon: Laurin Coder, MD;  Location: [facility];  Service: Endoscopy;;  . VIDEO BRONCHOSCOPY N/A 12/27/2019   Procedure: VIDEO BRONCHOSCOPY WITHOUT FLUORO;  Surgeon: Laurin Coder, MD;   Location: [facility];  Service: Endoscopy;  Laterality: N/A;    REVIEW OF SYSTEMS:   Review of Systems  Constitutional: Negative for appetite change, chills, fatigue, fever and unexpected weight change.  HENT: Negative for mouth sores, nosebleeds, sore throat and trouble swallowing.   Eyes: Negative for eye problems and icterus.  Respiratory: Positive for occasional cough and shortness of breath with exertion. Negative for hemoptysis and wheezing.   Cardiovascular: Negative for chest pain and leg swelling.  Gastrointestinal: Negative for abdominal pain, constipation, diarrhea, nausea and vomiting.  Genitourinary: Negative for bladder incontinence, difficulty urinating, dysuria, frequency and hematuria.   Musculoskeletal: Negative for back pain, gait problem, neck pain and neck stiffness.  Skin: Negative for itching and rash.  Neurological: Negative for dizziness, extremity weakness, gait problem, headaches, light-headedness and seizures.  Hematological: Negative for adenopathy. Does not bruise/bleed easily.  Psychiatric/Behavioral: Negative for confusion, depression and sleep disturbance. The patient is not nervous/anxious.     PHYSICAL EXAMINATION:  Blood pressure 117/79, pulse 98, temperature (!) 97.5 F (36.4 C), temperature source Tympanic, resp. rate 17, height 5\' 1"  (1.549 m), weight 121 lb 11.2 oz (55.2 kg), SpO2 98 %.  ECOG PERFORMANCE STATUS: 1 - Symptomatic but completely ambulatory  Physical Exam  Constitutional: Oriented to person, place, and time and well-developed, well-nourished, and in no distress.  HENT:  Head: Normocephalic and atraumatic.  Mouth/Throat: Oropharynx is clear and moist. No oropharyngeal exudate.  Eyes: Conjunctivae are normal. Right eye exhibits no discharge. Left eye exhibits no discharge. No scleral icterus.  Neck: Normal range of motion. Neck supple.  Cardiovascular: Normal rate, regular rhythm, normal heart sounds and intact distal pulses.    Pulmonary/Chest: Effort normal. Quiet breath sounds in right lung. No respiratory distress. No wheezes. No rales.  Abdominal: Soft. Bowel sounds are normal. Exhibits no distension and no mass. There is no tenderness.  Musculoskeletal: Normal range of motion. Exhibits no edema.  Lymphadenopathy:    No cervical adenopathy.  Neurological: Alert and oriented to person, place, and time. Exhibits normal muscle tone. Gait normal. Coordination normal.  Skin: Skin is warm and dry. No rash noted. Not diaphoretic. No erythema. No pallor.  Psychiatric: Mood, memory and judgment normal.  Vitals reviewed.  LABORATORY DATA: Lab Results  Component Value Date   WBC 4.5 06/08/2020   HGB 13.0 06/08/2020   HCT 41.2 06/08/2020   MCV 81.7 06/08/2020   PLT 204 06/08/2020      Chemistry      Component Value Date/Time   NA 141 05/07/2020 1145   NA 144 08/03/2019 1527   K 4.0 05/07/2020 1145   CL 105 05/07/2020 1145   CO2 27 05/07/2020 1145   BUN 11 05/07/2020 1145   BUN 9 08/03/2019 1527   CREATININE 0.86 05/07/2020 1145      Component Value Date/Time   CALCIUM 9.8 05/07/2020 1145   ALKPHOS 73 05/07/2020 1145   AST 12 (L) 05/07/2020 1145   ALT 9 05/07/2020 1145   BILITOT 0.4 05/07/2020 1145       RADIOGRAPHIC STUDIES:  No results found.   ASSESSMENT/PLAN:  This is  a very pleasant 65 year old African-American male recently diagnosed with stage IIIA (Tx, N2, M0) non-small cell lung cancer, adenocarcinoma. He presented with large right paratracheal soft tissue mass extending from the right apex to the right suprahilar and precarinal region diagnosed in August 2021. Molecular studies by [employer] showed no actionable mutations.   He completed concurrent chemoradiation with weekly carboplatin for an AUC of 2 and paclitaxel 45 mg/m2. He is status post 7 cycles. His last dose was given on 02/28/20  He is currently undergoing consolidation immunotherapy with Imfinzi 1500 mg IV every 4 weeks.  He is status post 2 cycles and is tolerating it well.   Labs were reviewed. Recommend that he proceed with cycle #3 today as scheduled.   I will arrange for a restaging CT scan of the chest prior to his next cycle of treatment.  We will see him back for a follow up visit in 4 weeks for evaluation and to review his scan results before starting cycle #4.    The patient was advised to call immediately if he has any concerning symptoms in the interval. The patient voices understanding of current disease status and treatment options and is in agreement with the current care plan. All questions were answered. The patient knows to call the clinic with any problems, questions or concerns. We can certainly see the patient much sooner if necessary  No orders of the defined types were placed in this encounter.    I spent 20-29 minutes in this encounter  Raiyah Speakman L Cherron Blitzer, PA-C 06/08/20

## 2020-06-08 ENCOUNTER — Inpatient Hospital Stay (HOSPITAL_BASED_OUTPATIENT_CLINIC_OR_DEPARTMENT_OTHER): Payer: Medicaid Other | Admitting: Physician Assistant

## 2020-06-08 ENCOUNTER — Encounter: Payer: Self-pay | Admitting: Physician Assistant

## 2020-06-08 ENCOUNTER — Inpatient Hospital Stay: Payer: Medicaid Other | Attending: Internal Medicine

## 2020-06-08 ENCOUNTER — Inpatient Hospital Stay: Payer: Medicaid Other

## 2020-06-08 ENCOUNTER — Other Ambulatory Visit: Payer: Self-pay

## 2020-06-08 VITALS — BP 117/79 | HR 98 | Temp 97.5°F | Resp 17 | Ht 61.0 in | Wt 121.7 lb

## 2020-06-08 DIAGNOSIS — Z5112 Encounter for antineoplastic immunotherapy: Secondary | ICD-10-CM

## 2020-06-08 DIAGNOSIS — Z79899 Other long term (current) drug therapy: Secondary | ICD-10-CM | POA: Diagnosis not present

## 2020-06-08 DIAGNOSIS — C3491 Malignant neoplasm of unspecified part of right bronchus or lung: Secondary | ICD-10-CM | POA: Insufficient documentation

## 2020-06-08 DIAGNOSIS — C3411 Malignant neoplasm of upper lobe, right bronchus or lung: Secondary | ICD-10-CM

## 2020-06-08 LAB — CMP (CANCER CENTER ONLY)
ALT: 7 U/L (ref 0–44)
AST: 13 U/L — ABNORMAL LOW (ref 15–41)
Albumin: 3.9 g/dL (ref 3.5–5.0)
Alkaline Phosphatase: 65 U/L (ref 38–126)
Anion gap: 9 (ref 5–15)
BUN: 14 mg/dL (ref 8–23)
CO2: 25 mmol/L (ref 22–32)
Calcium: 9.3 mg/dL (ref 8.9–10.3)
Chloride: 106 mmol/L (ref 98–111)
Creatinine: 0.85 mg/dL (ref 0.61–1.24)
GFR, Estimated: >60 mL/min (ref >60)
Glucose, Bld: 84 mg/dL (ref 70–99)
Potassium: 4.5 mmol/L (ref 3.5–5.1)
Sodium: 140 mmol/L (ref 135–145)
Total Bilirubin: 0.4 mg/dL (ref 0.3–1.2)
Total Protein: 7.3 g/dL (ref 6.5–8.1)

## 2020-06-08 LAB — CBC WITH DIFFERENTIAL (CANCER CENTER ONLY)
Abs Immature Granulocytes: 0.02 10*3/uL (ref 0.00–0.07)
Basophils Absolute: 0 10*3/uL (ref 0.0–0.1)
Basophils Relative: 0 %
Eosinophils Absolute: 0.1 10*3/uL (ref 0.0–0.5)
Eosinophils Relative: 2 %
HCT: 41.2 % (ref 39.0–52.0)
Hemoglobin: 13 g/dL (ref 13.0–17.0)
Immature Granulocytes: 0 %
Lymphocytes Relative: 24 %
Lymphs Abs: 1.1 10*3/uL (ref 0.7–4.0)
MCH: 25.8 pg — ABNORMAL LOW (ref 26.0–34.0)
MCHC: 31.6 g/dL (ref 30.0–36.0)
MCV: 81.7 fL (ref 80.0–100.0)
Monocytes Absolute: 0.6 10*3/uL (ref 0.1–1.0)
Monocytes Relative: 14 %
Neutro Abs: 2.7 10*3/uL (ref 1.7–7.7)
Neutrophils Relative %: 60 %
Platelet Count: 204 10*3/uL (ref 150–400)
RBC: 5.04 MIL/uL (ref 4.22–5.81)
RDW: 15.6 % — ABNORMAL HIGH (ref 11.5–15.5)
WBC Count: 4.5 10*3/uL (ref 4.0–10.5)
nRBC: 0 % (ref 0.0–0.2)

## 2020-06-08 LAB — TSH: TSH: 0.981 u[IU]/mL (ref 0.320–4.118)

## 2020-06-08 MED ORDER — SODIUM CHLORIDE 0.9 % IV SOLN
Freq: Once | INTRAVENOUS | Status: AC
  Filled 2020-06-08: qty 250

## 2020-06-08 MED ORDER — SODIUM CHLORIDE 0.9 % IV SOLN
1500.0000 mg | Freq: Once | INTRAVENOUS | Status: AC
  Administered 2020-06-08: 1500 mg via INTRAVENOUS
  Filled 2020-06-08: qty 30

## 2020-06-08 NOTE — Patient Instructions (Signed)
[facility] Discharge Instructions for Patients Receiving Chemotherapy  Today you received the following chemotherapy agents: durvalumab.  To help prevent nausea and vomiting after your treatment, we encourage you to take your nausea medication as directed.   If you develop nausea and vomiting that is not controlled by your nausea medication, call the clinic.   BELOW ARE SYMPTOMS THAT SHOULD BE REPORTED IMMEDIATELY:  *FEVER GREATER THAN 100.5 F  *CHILLS WITH OR WITHOUT FEVER  NAUSEA AND VOMITING THAT IS NOT CONTROLLED WITH YOUR NAUSEA MEDICATION  *UNUSUAL SHORTNESS OF BREATH  *UNUSUAL BRUISING OR BLEEDING  TENDERNESS IN MOUTH AND THROAT WITH OR WITHOUT PRESENCE OF ULCERS  *URINARY PROBLEMS  *BOWEL PROBLEMS  UNUSUAL RASH Items with * indicate a potential emergency and should be followed up as soon as possible.  Feel free to call the clinic should you have any questions or concerns. The clinic phone number is [phone].  Please show the CHEMO ALERT CARD at check-in to the Emergency Department and triage nurse.   

## 2020-06-29 NOTE — Progress Notes (Signed)
[facility] OFFICE PROGRESS NOTE  [name], NP [address] [place] [address]  DIAGNOSIS: Stage IIIA (Tx, N2, M0) non-small cell lung cancer, adenocarcinoma diagnosed in August 2021 and presented with large right paratracheal soft tissue mass extending from the right apex to the right suprahilar and precarinal region.  Molecular studies by [employer]:  [id number], 31.9%, Olaparib  [id number], 68.6%. None  BRAFAmplification, High (+++) Plasma Copy Number: 5.0  PRIOR THERAPY: Concurrent chemoradiation with weekly carboplatin for AUC of 2 and paclitaxel 45 mg/M2. First dose January 17, 2020. Status post 7 cycles. Last dose was given on February 28, 2020.  CURRENT THERAPY: Consolidation treatment with immunotherapy with Imfinzi 1500 mg IV every 4 weeks. First dose April 09, 2020. Status post 3 cycles.  INTERVAL HISTORY: [name] [name] 65 y.o. male returns to the clinic for a follow up visit. The patient is feeling well today without any concerning complaints. The patient continues to tolerate treatment with immunotherapy with Imfinzi well without any adverse effects. Denies any fever, chills, night sweats, or weight loss. He actually gained 4 lbs since his last appointment. Denies any chest pain or hemoptysis. He reports his baseline occasional cough which sometimes produces clear/white sputum. He also reports shortness of breath with exertion. Denies any nausea, vomiting, diarrhea, or constipation. Denies any headache or visual changes. Denies any rashes or skin changes. The patient recently had a restaging CT scan performed. The patient is here today for evaluation prior to starting cycle #4    MEDICAL HISTORY: Past Medical History:  Diagnosis Date  . 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[place]) 12/25/2019  . COPD (chronic obstructive pulmonary disease) ([place]) 11/02/2007   Qualifier: Diagnosis of  By: Melvyn Novas MD, Christena Deem   . Hypertension 12/24/2019  . Iron  deficiency anemia 08/23/2007   Qualifier: Diagnosis of  By: Jim Like      ALLERGIES:  has No Known Allergies.  MEDICATIONS:  Current Outpatient Medications  Medication Sig Dispense Refill  . acetaminophen (TYLENOL) 500 MG tablet Take 500 mg by mouth every 6 (six) hours as needed for mild pain or moderate pain.    Marland Kitchen aspirin 81 MG EC tablet Take 1 tablet (81 mg total) by mouth daily. 30 tablet 11  . budesonide-formoterol (SYMBICORT) 80-4.5 MCG/ACT inhaler Inhale 2 puffs into the lungs 2 (two) times daily. (Patient taking differently: Inhale 2 puffs into the lungs 2 (two) times daily as needed (wheezing, SOB).) 1 Inhaler 11  . feeding supplement (ENSURE ENLIVE / ENSURE PLUS) LIQD Take 237 mLs by mouth 2 (two) times daily between meals. 237 mL 12  . Multiple Vitamin (MULTIVITAMIN) tablet Take 1 tablet by mouth daily.    . prochlorperazine (COMPAZINE) 10 MG tablet Take 1 tablet (10 mg total) by mouth every 6 (six) hours as needed for nausea or vomiting. 30 tablet 0  . senna-docusate (SENOKOT-S) 8.6-50 MG tablet Take 1 tablet by mouth at bedtime.    Marland Kitchen SPIRIVA HANDIHALER 18 MCG inhalation capsule 1 capsule daily.    . sucralfate (CARAFATE) 1 g tablet Take 1 tablet (1 g total) by mouth 4 (four) times daily. Dissolve each tablet in 15 cc water before use. 120 tablet 2  . VENTOLIN HFA 108 (90 Base) MCG/ACT inhaler Inhale 2 puffs into the lungs every 4 (four) hours as needed.     No current facility-administered medications for this visit.    SURGICAL HISTORY:  Past Surgical History:  Procedure Laterality Date  . ENDOBRONCHIAL ULTRASOUND N/A 12/27/2019  Procedure: ENDOBRONCHIAL ULTRASOUND;  Surgeon: Laurin Coder, MD;  Location: [facility];  Service: Endoscopy;  Laterality: N/A;  . FINE NEEDLE ASPIRATION  12/27/2019   Procedure: FINE NEEDLE ASPIRATION;  Surgeon: Laurin Coder, MD;  Location: [facility];  Service: Endoscopy;;  . VIDEO BRONCHOSCOPY N/A 12/27/2019   Procedure:  VIDEO BRONCHOSCOPY WITHOUT FLUORO;  Surgeon: Laurin Coder, MD;  Location: [facility];  Service: Endoscopy;  Laterality: N/A;    REVIEW OF SYSTEMS:   Review of Systems  Constitutional: Negative for appetite change, chills, fatigue, fever and unexpected weight change.  HENT: Negative for mouth sores, nosebleeds, sore throat and trouble swallowing.   Eyes: Negative for eye problems and icterus.  Respiratory: Positive for occasional cough and shortness of breath with exertion. Negative for hemoptysis and wheezing.   Cardiovascular: Negative for chest pain and leg swelling.  Gastrointestinal: Negative for abdominal pain, constipation, diarrhea, nausea and vomiting.  Genitourinary: Negative for bladder incontinence, difficulty urinating, dysuria, frequency and hematuria.   Musculoskeletal: Negative for back pain, gait problem, neck pain and neck stiffness.  Skin: Negative for itching and rash.  Neurological: Negative for dizziness, extremity weakness, gait problem, headaches, light-headedness and seizures.  Hematological: Negative for adenopathy. Does not bruise/bleed easily.  Psychiatric/Behavioral: Negative for confusion, depression and sleep disturbance. The patient is not nervous/anxious.      PHYSICAL EXAMINATION:  Blood pressure 113/76, pulse 95, temperature 97.8 F (36.6 C), temperature source Tympanic, resp. rate 18, height 5\' 1"  (1.549 m), weight 125 lb 14.4 oz (57.1 kg), SpO2 98 %.  ECOG PERFORMANCE STATUS: 1 - Symptomatic but completely ambulatory  Physical Exam  Constitutional: Oriented to person, place, and time and well-developed, well-nourished, and in no distress.  HENT:  Head: Normocephalic and atraumatic.  Mouth/Throat: Oropharynx is clear and moist. No oropharyngeal exudate.  Eyes: Conjunctivae are normal. Right eye exhibits no discharge. Left eye exhibits no discharge. No scleral icterus.  Neck: Normal range of motion. Neck supple.  Cardiovascular: Normal rate,  regular rhythm, normal heart sounds and intact distal pulses.   Pulmonary/Chest: Effort normal. Quiet breath sounds in right lung. No respiratory distress. No wheezes. No rales.  Abdominal: Soft. Bowel sounds are normal. Exhibits no distension and no mass. There is no tenderness.  Musculoskeletal: Normal range of motion. Exhibits no edema.  Lymphadenopathy:    No cervical adenopathy.  Neurological: Alert and oriented to person, place, and time. Exhibits normal muscle tone. Gait normal. Coordination normal.  Skin: Skin is warm and dry. No rash noted. Not diaphoretic. No erythema. No pallor.  Psychiatric: Mood, memory and judgment normal.  Vitals reviewed.  LABORATORY DATA: Lab Results  Component Value Date   WBC 4.1 07/05/2020   HGB 12.8 (L) 07/05/2020   HCT 41.0 07/05/2020   MCV 80.1 07/05/2020   PLT 238 07/05/2020      Chemistry      Component Value Date/Time   NA 140 06/08/2020 0828   NA 144 08/03/2019 1527   K 4.5 06/08/2020 0828   CL 106 06/08/2020 0828   CO2 25 06/08/2020 0828   BUN 14 06/08/2020 0828   BUN 9 08/03/2019 1527   CREATININE 0.85 06/08/2020 0828      Component Value Date/Time   CALCIUM 9.3 06/08/2020 0828   ALKPHOS 65 06/08/2020 0828   AST 13 (L) 06/08/2020 0828   ALT 7 06/08/2020 0828   BILITOT 0.4 06/08/2020 0828       RADIOGRAPHIC STUDIES:  CT Chest W Contrast  Result Date: 07/02/2020  CLINICAL DATA:  Non-small cell lung cancer, metastatic disease. Currently on systemic therapy by report, 65 year old male. EXAM: CT CHEST WITH CONTRAST TECHNIQUE: Multidetector CT imaging of the chest was performed during intravenous contrast administration. CONTRAST:  44mL OMNIPAQUE IOHEXOL 300 MG/ML  SOLN COMPARISON:  March 28, 2020 FINDINGS: Cardiovascular: Stable appearance of the thoracic aorta and central pulmonary vasculature compared to previous imaging. Stable heart size with similar appearance of small pericardial effusion. Mediastinum/Nodes: Mass abutting  the trachea in the esophagus in the posterior and middle mediastinum along the RIGHT mediastinal border measures 3.1 x 2.9 cm as compared to 4.0 x 3.4 cm on the prior study. No discrete adenopathy by size criteria, thickening along the RIGHT paratracheal chain on image 48 of series 2 approximately 8 mm greatest thickness is similar to the prior study. Small lymph nodes throughout the chest are otherwise little changed including a small pre-vascular lymph node on image 47 of series 2. No thoracic inlet lymphadenopathy. No axillary lymphadenopathy. No gross hilar lymphadenopathy. Mildly patulous esophagus with similar appearance. Lungs/Pleura: New LEFT lower lobe pulmonary nodule (image 97, series 5) 11 x 9 mm. New LEFT lower lobe pulmonary nodule (image 89, series 5) 7 mm. Volume loss, cavitary changes and bronchiectatic changes in the LEFT upper lobe are unchanged. Air-containing cavity in this area approximately 5 x 3.8 cm measuring as large as 5.9 cm greatest axial dimension. No new area of consolidation or sign of pleural effusion. Background of paraseptal emphysema and centrilobular emphysema without change. Upper Abdomen: Incidental imaging of upper abdominal contents with no acute process. Musculoskeletal: No acute musculoskeletal finding. Spinal degenerative changes. IMPRESSION: 1. New LEFT lower lobe pulmonary nodules, concerning for metastatic disease. 2. Continued decrease in size of the mass/adenopathy in the RIGHT chest along the mediastinal border. 3. Similar appearance of volume loss and thick-walled multi cystic area in the LEFT upper lobe associated with bronchiectatic changes. 4. Stable small pericardial effusion. 5. Emphysema and aortic atherosclerosis. Aortic Atherosclerosis (ICD10-I70.0) and Emphysema (ICD10-J43.9). Electronically Signed   By: Zetta Bills M.D.   On: 07/02/2020 12:04     ASSESSMENT/PLAN:  This is a very pleasant 65 year old African-American male recently diagnosed with  stage IIIA (Tx, N2, M0) non-small cell lung cancer, adenocarcinoma. Hepresented with large right paratracheal soft tissue mass extending from the right apex to the right suprahilar and precarinal region diagnosed in August 2021. Molecular studies by [employer] showed no actionable mutations.   He completed concurrent chemoradiation with weekly carboplatin for an AUC of 2 and paclitaxel 45 mg/m2. He is status post 7 cycles. His last dose was given on 02/28/20  He is currently undergoing consolidation immunotherapy with Imfinzi 1500 mg IV every 4 weeks. He is status post 3 cycles and is tolerating it well.   The patient recently had a restaging CT scan of the chest performed. Dr. Julien Nordmann personally and independently reviewed the scan and discussed the results with the patient. The scan showed decreased in size of the mass/adenopathy in the right chest along the mediastinal border. However, the scan showed new left lower lobe pulmonary nodules, which may represent metastatic disease. Dr. Julien Nordmann recommends that we continue on the same treatment for now with close monitoring. We will arrange for a restaging CT scan in 2 months as opposed to 3 months. If these nodules continue to grow, Dr. Julien Nordmann will likely consider changing treatment or referring the patient to radiation oncology.    He will proceed with cycle #4 today as scheduled.  We will see him back for a follow up visit in 4 weeks for evaluation before starting cycle #5   The patient was advised to call immediately if he has any concerning symptoms in the interval. The patient voices understanding of current disease status and treatment options and is in agreement with the current care plan. All questions were answered. The patient knows to call the clinic with any problems, questions or concerns. We can certainly see the patient much sooner if necessary  No orders of the defined types were placed in this encounter.    I spent 20-29  minutes in this encounter.   Dalal Livengood L Bryor Rami, PA-C 07/05/20  ADDENDUM: Hematology/Oncology Attending: I had a face-to-face encounter with the patient today.  I reviewed his lab, scan and recommended his care plan.  This is a very pleasant 65 years old African-American male diagnosed with stage IIIa non-small cell lung cancer, adenocarcinoma with no actionable mutations.  He is status post a course of induction concurrent chemoradiation with weekly carboplatin and paclitaxel with partial response.  The patient is currently undergoing consolidation treatment with immunotherapy with Imfinzi status post 3 cycles.  He has been tolerating this treatment well with no concerning adverse effects. The patient had repeat CT scan of the chest performed recently.  I personally and independently reviewed the scan images and discussed the results with the patient today. His scan showed further improvement in the dominant left upper lobe lung mass but unfortunately there are 2 small nodules in the left lower lobe concerning for new lung metastasis/metachronous lesions. I recommended for the patient to continue his current treatment with Imfinzi and he will proceed with cycle #4 today. I will continue to monitor the 2 nodules in the left lower lobe closely on the upcoming scan which will be done after cycle #5. If the patient continues to have evidence for disease progression in these nodules or other nodules, we may consider switching him treatment to a different regimen or consider him for SBRT to the solitary nodules. The patient agreed to the current plan. He was advised to call immediately if he has any other concerning symptoms in the interval.  Disclaimer: This note was dictated with voice recognition software. Similar sounding words can inadvertently be transcribed and may be missed upon review. Eilleen Kempf, MD 07/05/20

## 2020-07-02 ENCOUNTER — Ambulatory Visit (HOSPITAL_COMMUNITY)
Admission: RE | Admit: 2020-07-02 | Discharge: 2020-07-02 | Disposition: A | Discharge status: Home / Self Care | Payer: Medicaid Other | Source: Ambulatory Visit | Attending: Physician Assistant | Admitting: Physician Assistant

## 2020-07-02 ENCOUNTER — Other Ambulatory Visit: Payer: Self-pay

## 2020-07-02 ENCOUNTER — Ambulatory Visit: Payer: Self-pay

## 2020-07-02 ENCOUNTER — Encounter (HOSPITAL_COMMUNITY): Payer: Self-pay

## 2020-07-02 ENCOUNTER — Ambulatory Visit: Payer: Self-pay | Admitting: Internal Medicine

## 2020-07-02 DIAGNOSIS — C3411 Malignant neoplasm of upper lobe, right bronchus or lung: Secondary | ICD-10-CM

## 2020-07-02 MED ORDER — IOHEXOL 300 MG/ML  SOLN
75.0000 mL | Freq: Once | INTRAMUSCULAR | Status: AC | PRN
  Administered 2020-07-02: 75 mL via INTRAVENOUS

## 2020-07-05 ENCOUNTER — Inpatient Hospital Stay (HOSPITAL_BASED_OUTPATIENT_CLINIC_OR_DEPARTMENT_OTHER): Payer: Medicaid Other | Admitting: Physician Assistant

## 2020-07-05 ENCOUNTER — Encounter: Payer: Self-pay | Admitting: Physician Assistant

## 2020-07-05 ENCOUNTER — Other Ambulatory Visit: Payer: Self-pay

## 2020-07-05 ENCOUNTER — Inpatient Hospital Stay: Payer: Medicaid Other | Attending: Internal Medicine

## 2020-07-05 ENCOUNTER — Inpatient Hospital Stay: Payer: Medicaid Other

## 2020-07-05 VITALS — BP 113/76 | HR 95 | Temp 97.8°F | Resp 18 | Ht 61.0 in | Wt 125.9 lb

## 2020-07-05 DIAGNOSIS — C3411 Malignant neoplasm of upper lobe, right bronchus or lung: Secondary | ICD-10-CM

## 2020-07-05 DIAGNOSIS — Z5112 Encounter for antineoplastic immunotherapy: Secondary | ICD-10-CM | POA: Diagnosis not present

## 2020-07-05 DIAGNOSIS — C3491 Malignant neoplasm of unspecified part of right bronchus or lung: Secondary | ICD-10-CM | POA: Diagnosis present

## 2020-07-05 DIAGNOSIS — Z79899 Other long term (current) drug therapy: Secondary | ICD-10-CM | POA: Insufficient documentation

## 2020-07-05 DIAGNOSIS — J449 Chronic obstructive pulmonary disease, unspecified: Secondary | ICD-10-CM | POA: Diagnosis not present

## 2020-07-05 DIAGNOSIS — I10 Essential (primary) hypertension: Secondary | ICD-10-CM | POA: Diagnosis not present

## 2020-07-05 LAB — CMP (CANCER CENTER ONLY)
ALT: 9 U/L (ref 0–44)
AST: 15 U/L (ref 15–41)
Albumin: 3.8 g/dL (ref 3.5–5.0)
Alkaline Phosphatase: 57 U/L (ref 38–126)
Anion gap: 7 (ref 5–15)
BUN: 12 mg/dL (ref 8–23)
CO2: 25 mmol/L (ref 22–32)
Calcium: 8.9 mg/dL (ref 8.9–10.3)
Chloride: 105 mmol/L (ref 98–111)
Creatinine: 0.83 mg/dL (ref 0.61–1.24)
GFR, Estimated: >60 mL/min (ref >60)
Glucose, Bld: 91 mg/dL (ref 70–99)
Potassium: 4.1 mmol/L (ref 3.5–5.1)
Sodium: 137 mmol/L (ref 135–145)
Total Bilirubin: 0.3 mg/dL (ref 0.3–1.2)
Total Protein: 7 g/dL (ref 6.5–8.1)

## 2020-07-05 LAB — CBC WITH DIFFERENTIAL (CANCER CENTER ONLY)
Abs Immature Granulocytes: 0.01 10*3/uL (ref 0.00–0.07)
Basophils Absolute: 0 10*3/uL (ref 0.0–0.1)
Basophils Relative: 1 %
Eosinophils Absolute: 0.1 10*3/uL (ref 0.0–0.5)
Eosinophils Relative: 2 %
HCT: 41 % (ref 39.0–52.0)
Hemoglobin: 12.8 g/dL — ABNORMAL LOW (ref 13.0–17.0)
Immature Granulocytes: 0 %
Lymphocytes Relative: 24 %
Lymphs Abs: 1 10*3/uL (ref 0.7–4.0)
MCH: 25 pg — ABNORMAL LOW (ref 26.0–34.0)
MCHC: 31.2 g/dL (ref 30.0–36.0)
MCV: 80.1 fL (ref 80.0–100.0)
Monocytes Absolute: 0.6 10*3/uL (ref 0.1–1.0)
Monocytes Relative: 15 %
Neutro Abs: 2.4 10*3/uL (ref 1.7–7.7)
Neutrophils Relative %: 58 %
Platelet Count: 238 10*3/uL (ref 150–400)
RBC: 5.12 MIL/uL (ref 4.22–5.81)
RDW: 15.5 % (ref 11.5–15.5)
WBC Count: 4.1 10*3/uL (ref 4.0–10.5)
nRBC: 0 % (ref 0.0–0.2)

## 2020-07-05 LAB — TSH: TSH: 1.697 u[IU]/mL (ref 0.320–4.118)

## 2020-07-05 MED ORDER — SODIUM CHLORIDE 0.9 % IV SOLN
Freq: Once | INTRAVENOUS | Status: AC
  Filled 2020-07-05: qty 250

## 2020-07-05 MED ORDER — SODIUM CHLORIDE 0.9 % IV SOLN
1500.0000 mg | Freq: Once | INTRAVENOUS | Status: AC
  Administered 2020-07-05: 1500 mg via INTRAVENOUS
  Filled 2020-07-05: qty 30

## 2020-07-05 NOTE — Patient Instructions (Signed)
[facility] Discharge Instructions for Patients Receiving Chemotherapy  Today you received the following chemotherapy agents: durvalumab.  To help prevent nausea and vomiting after your treatment, we encourage you to take your nausea medication as directed.   If you develop nausea and vomiting that is not controlled by your nausea medication, call the clinic.   BELOW ARE SYMPTOMS THAT SHOULD BE REPORTED IMMEDIATELY:  *FEVER GREATER THAN 100.5 F  *CHILLS WITH OR WITHOUT FEVER  NAUSEA AND VOMITING THAT IS NOT CONTROLLED WITH YOUR NAUSEA MEDICATION  *UNUSUAL SHORTNESS OF BREATH  *UNUSUAL BRUISING OR BLEEDING  TENDERNESS IN MOUTH AND THROAT WITH OR WITHOUT PRESENCE OF ULCERS  *URINARY PROBLEMS  *BOWEL PROBLEMS  UNUSUAL RASH Items with * indicate a potential emergency and should be followed up as soon as possible.  Feel free to call the clinic should you have any questions or concerns. The clinic phone number is [phone].  Please show the CHEMO ALERT CARD at check-in to the Emergency Department and triage nurse.   

## 2020-07-06 ENCOUNTER — Telehealth: Payer: Self-pay | Admitting: Physician Assistant

## 2020-07-06 NOTE — Telephone Encounter (Signed)
Scheduled appointments per 2/17 los. Will have updated calendar printed for patient at next patient.

## 2020-07-30 ENCOUNTER — Inpatient Hospital Stay: Payer: Medicaid Other | Attending: Internal Medicine

## 2020-07-30 ENCOUNTER — Inpatient Hospital Stay: Payer: Medicaid Other

## 2020-07-30 ENCOUNTER — Other Ambulatory Visit: Payer: Self-pay

## 2020-07-30 ENCOUNTER — Inpatient Hospital Stay (HOSPITAL_BASED_OUTPATIENT_CLINIC_OR_DEPARTMENT_OTHER): Payer: Medicaid Other | Admitting: Internal Medicine

## 2020-07-30 DIAGNOSIS — C3491 Malignant neoplasm of unspecified part of right bronchus or lung: Secondary | ICD-10-CM | POA: Insufficient documentation

## 2020-07-30 DIAGNOSIS — Z79899 Other long term (current) drug therapy: Secondary | ICD-10-CM | POA: Diagnosis not present

## 2020-07-30 DIAGNOSIS — I10 Essential (primary) hypertension: Secondary | ICD-10-CM | POA: Diagnosis not present

## 2020-07-30 DIAGNOSIS — Z5112 Encounter for antineoplastic immunotherapy: Secondary | ICD-10-CM | POA: Insufficient documentation

## 2020-07-30 DIAGNOSIS — C3411 Malignant neoplasm of upper lobe, right bronchus or lung: Secondary | ICD-10-CM

## 2020-07-30 DIAGNOSIS — C3490 Malignant neoplasm of unspecified part of unspecified bronchus or lung: Secondary | ICD-10-CM

## 2020-07-30 LAB — CMP (CANCER CENTER ONLY)
ALT: 9 U/L (ref 0–44)
AST: 16 U/L (ref 15–41)
Albumin: 4.2 g/dL (ref 3.5–5.0)
Alkaline Phosphatase: 62 U/L (ref 38–126)
Anion gap: 9 (ref 5–15)
BUN: 11 mg/dL (ref 8–23)
CO2: 25 mmol/L (ref 22–32)
Calcium: 9.5 mg/dL (ref 8.9–10.3)
Chloride: 104 mmol/L (ref 98–111)
Creatinine: 0.82 mg/dL (ref 0.61–1.24)
GFR, Estimated: >60 mL/min (ref >60)
Glucose, Bld: 79 mg/dL (ref 70–99)
Potassium: 3.9 mmol/L (ref 3.5–5.1)
Sodium: 138 mmol/L (ref 135–145)
Total Bilirubin: 0.3 mg/dL (ref 0.3–1.2)
Total Protein: 7.9 g/dL (ref 6.5–8.1)

## 2020-07-30 LAB — CBC WITH DIFFERENTIAL (CANCER CENTER ONLY)
Abs Immature Granulocytes: 0.03 10*3/uL (ref 0.00–0.07)
Basophils Absolute: 0 10*3/uL (ref 0.0–0.1)
Basophils Relative: 1 %
Eosinophils Absolute: 0.1 10*3/uL (ref 0.0–0.5)
Eosinophils Relative: 2 %
HCT: 43.4 % (ref 39.0–52.0)
Hemoglobin: 13.9 g/dL (ref 13.0–17.0)
Immature Granulocytes: 1 %
Lymphocytes Relative: 18 %
Lymphs Abs: 0.9 10*3/uL (ref 0.7–4.0)
MCH: 25.2 pg — ABNORMAL LOW (ref 26.0–34.0)
MCHC: 32 g/dL (ref 30.0–36.0)
MCV: 78.6 fL — ABNORMAL LOW (ref 80.0–100.0)
Monocytes Absolute: 0.7 10*3/uL (ref 0.1–1.0)
Monocytes Relative: 14 %
Neutro Abs: 3.4 10*3/uL (ref 1.7–7.7)
Neutrophils Relative %: 64 %
Platelet Count: 267 10*3/uL (ref 150–400)
RBC: 5.52 MIL/uL (ref 4.22–5.81)
RDW: 15.5 % (ref 11.5–15.5)
WBC Count: 5.1 10*3/uL (ref 4.0–10.5)
nRBC: 0 % (ref 0.0–0.2)

## 2020-07-30 LAB — TSH: TSH: 1.996 u[IU]/mL (ref 0.320–4.118)

## 2020-07-30 MED ORDER — SODIUM CHLORIDE 0.9 % IV SOLN
1500.0000 mg | Freq: Once | INTRAVENOUS | Status: AC
  Administered 2020-07-30: 1500 mg via INTRAVENOUS
  Filled 2020-07-30: qty 30

## 2020-07-30 MED ORDER — SODIUM CHLORIDE 0.9 % IV SOLN
Freq: Once | INTRAVENOUS | Status: AC
  Filled 2020-07-30: qty 250

## 2020-07-30 NOTE — Progress Notes (Signed)
.[place] Telephone:([phone]   [phone]  OFFICE PROGRESS NOTE  [name], NP [id number] [place] [address]  DIAGNOSIS: Stage IIIA (Tx, N2, M0) non-small cell lung cancer, adenocarcinoma diagnosed in August 2021 and presented with large right paratracheal soft tissue mass extending from the right apex to the right suprahilar and precarinal region.  Molecular studies by [employer]:  [id number], 31.9%, Olaparib  [id number], 68.6%. None   BRAFAmplification, High (+++) Plasma Copy Number: 5.0  PRIOR THERAPY: Concurrent chemoradiation with weekly carboplatin for AUC of 2 and paclitaxel 45 mg/M2.  First dose January 17, 2020.  Status post 7 cycles.  Last dose was given on February 28, 2020.  CURRENT THERAPY: Consolidation treatment with immunotherapy with Imfinzi 1500 mg IV every 4 weeks.  First dose April 09, 2020.  Status post 4 cycles.  INTERVAL HISTORY: [name] [name] 65 y.o. male returns to the clinic today for follow-up visit.  The patient is feeling fine today with no concerning complaints.  He denied having any chest pain, shortness of breath, cough or hemoptysis.  He denied having any fever or chills.  He has no nausea, vomiting, diarrhea or constipation.  He has no headache or visual changes.  He continues to tolerate his treatment with immunotherapy fairly well.  The patient is here today for evaluation before starting cycle #5 of his treatment.  MEDICAL HISTORY: Past Medical History:  Diagnosis Date  . 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[place]) 12/25/2019  . COPD (chronic obstructive pulmonary disease) ([place]) 11/02/2007   Qualifier: Diagnosis of  By: Melvyn Novas MD, Christena Deem   . Hypertension 12/24/2019  . Iron deficiency anemia 08/23/2007   Qualifier: Diagnosis of  By: Jim Like      ALLERGIES:  has No Known Allergies.  MEDICATIONS:  Current Outpatient Medications  Medication Sig Dispense Refill  . acetaminophen (TYLENOL) 500  MG tablet Take 500 mg by mouth every 6 (six) hours as needed for mild pain or moderate pain.    Marland Kitchen aspirin 81 MG EC tablet Take 1 tablet (81 mg total) by mouth daily. 30 tablet 11  . budesonide-formoterol (SYMBICORT) 80-4.5 MCG/ACT inhaler Inhale 2 puffs into the lungs 2 (two) times daily. (Patient taking differently: Inhale 2 puffs into the lungs 2 (two) times daily as needed (wheezing, SOB).) 1 Inhaler 11  . feeding supplement (ENSURE ENLIVE / ENSURE PLUS) LIQD Take 237 mLs by mouth 2 (two) times daily between meals. 237 mL 12  . Multiple Vitamin (MULTIVITAMIN) tablet Take 1 tablet by mouth daily.    . prochlorperazine (COMPAZINE) 10 MG tablet Take 1 tablet (10 mg total) by mouth every 6 (six) hours as needed for nausea or vomiting. 30 tablet 0  . senna-docusate (SENOKOT-S) 8.6-50 MG tablet Take 1 tablet by mouth at bedtime.    Marland Kitchen SPIRIVA HANDIHALER 18 MCG inhalation capsule 1 capsule daily.    . sucralfate (CARAFATE) 1 g tablet Take 1 tablet (1 g total) by mouth 4 (four) times daily. Dissolve each tablet in 15 cc water before use. 120 tablet 2  . VENTOLIN HFA 108 (90 Base) MCG/ACT inhaler Inhale 2 puffs into the lungs every 4 (four) hours as needed.     No current facility-administered medications for this visit.    SURGICAL HISTORY:  Past Surgical History:  Procedure Laterality Date  . ENDOBRONCHIAL ULTRASOUND N/A 12/27/2019   Procedure: ENDOBRONCHIAL ULTRASOUND;  Surgeon: Laurin Coder, MD;  Location: [facility] ENDOSCOPY;  Service: Endoscopy;  Laterality: N/A;  . FINE NEEDLE ASPIRATION  12/27/2019   Procedure: FINE NEEDLE ASPIRATION;  Surgeon: Laurin Coder, MD;  Location: [facility] ENDOSCOPY;  Service: Endoscopy;;  . VIDEO BRONCHOSCOPY N/A 12/27/2019   Procedure: VIDEO BRONCHOSCOPY WITHOUT FLUORO;  Surgeon: Laurin Coder, MD;  Location: [facility] ENDOSCOPY;  Service: Endoscopy;  Laterality: N/A;    REVIEW OF SYSTEMS:  A comprehensive review of systems was negative.   PHYSICAL EXAMINATION:  General appearance: alert, cooperative and no distress Head: Normocephalic, without obvious abnormality, atraumatic Neck: no adenopathy, no JVD, supple, symmetrical, trachea midline and thyroid not enlarged, symmetric, no tenderness/mass/nodules Lymph nodes: Cervical, supraclavicular, and axillary nodes normal. Resp: clear to auscultation bilaterally Back: symmetric, no curvature. ROM normal. No CVA tenderness. Cardio: regular rate and rhythm, S1, S2 normal, no murmur, click, rub or gallop GI: soft, non-tender; bowel sounds normal; no masses,  no organomegaly Extremities: extremities normal, atraumatic, no cyanosis or edema  ECOG PERFORMANCE STATUS: 1 - Symptomatic but completely ambulatory  Blood pressure 128/80, pulse 94, temperature (!) 96.4 F (35.8 C), temperature source Tympanic, resp. rate 17, height 5\' 1"  (1.549 m), weight 124 lb 1.6 oz (56.3 kg), SpO2 97 %.  LABORATORY DATA: Lab Results  Component Value Date   WBC 5.1 07/30/2020   HGB 13.9 07/30/2020   HCT 43.4 07/30/2020   MCV 78.6 (L) 07/30/2020   PLT 267 07/30/2020      Chemistry      Component Value Date/Time   NA 137 07/05/2020 0741   NA 144 08/03/2019 1527   K 4.1 07/05/2020 0741   CL 105 07/05/2020 0741   CO2 25 07/05/2020 0741   BUN 12 07/05/2020 0741   BUN 9 08/03/2019 1527   CREATININE 0.83 07/05/2020 0741      Component Value Date/Time   CALCIUM 8.9 07/05/2020 0741   ALKPHOS 57 07/05/2020 0741   AST 15 07/05/2020 0741   ALT 9 07/05/2020 0741   BILITOT 0.3 07/05/2020 0741       RADIOGRAPHIC STUDIES: CT Chest W Contrast  Result Date: 07/02/2020 CLINICAL DATA:  Non-small cell lung cancer, metastatic disease. Currently on systemic therapy by report, 65 year old male. EXAM: CT CHEST WITH CONTRAST TECHNIQUE: Multidetector CT imaging of the chest was performed during intravenous contrast administration. CONTRAST:  66mL OMNIPAQUE IOHEXOL 300 MG/ML  SOLN COMPARISON:  March 28, 2020 FINDINGS:  Cardiovascular: Stable appearance of the thoracic aorta and central pulmonary vasculature compared to previous imaging. Stable heart size with similar appearance of small pericardial effusion. Mediastinum/Nodes: Mass abutting the trachea in the esophagus in the posterior and middle mediastinum along the RIGHT mediastinal border measures 3.1 x 2.9 cm as compared to 4.0 x 3.4 cm on the prior study. No discrete adenopathy by size criteria, thickening along the RIGHT paratracheal chain on image 48 of series 2 approximately 8 mm greatest thickness is similar to the prior study. Small lymph nodes throughout the chest are otherwise little changed including a small pre-vascular lymph node on image 47 of series 2. No thoracic inlet lymphadenopathy. No axillary lymphadenopathy. No gross hilar lymphadenopathy. Mildly patulous esophagus with similar appearance. Lungs/Pleura: New LEFT lower lobe pulmonary nodule (image 97, series 5) 11 x 9 mm. New LEFT lower lobe pulmonary nodule (image 89, series 5) 7 mm. Volume loss, cavitary changes and bronchiectatic changes in the LEFT upper lobe are unchanged. Air-containing cavity in this area approximately 5 x 3.8 cm measuring as large as 5.9 cm greatest axial dimension. No new area of consolidation or sign  of pleural effusion. Background of paraseptal emphysema and centrilobular emphysema without change. Upper Abdomen: Incidental imaging of upper abdominal contents with no acute process. Musculoskeletal: No acute musculoskeletal finding. Spinal degenerative changes. IMPRESSION: 1. New LEFT lower lobe pulmonary nodules, concerning for metastatic disease. 2. Continued decrease in size of the mass/adenopathy in the RIGHT chest along the mediastinal border. 3. Similar appearance of volume loss and thick-walled multi cystic area in the LEFT upper lobe associated with bronchiectatic changes. 4. Stable small pericardial effusion. 5. Emphysema and aortic atherosclerosis. Aortic Atherosclerosis  (ICD10-I70.0) and Emphysema (ICD10-J43.9). Electronically Signed   By: Zetta Bills M.D.   On: 07/02/2020 12:04    ASSESSMENT AND PLAN: This is a very pleasant 66 years old African-American male recently diagnosed with stage IIIA (Tx, N2, M0) non-small cell lung cancer, adenocarcinoma presented with large right paratracheal soft tissue mass extending from the right apex to the right suprahilar and precarinal region diagnosed in August 2021. Molecular studies by [employer] showed no actionable mutations. The patient completed a course of concurrent chemoradiation with weekly carboplatin for AUC of 2 and paclitaxel 45 mg/M2.  He is status post 7 cycles.  Last dose was given February 28, 2020 The patient tolerated the previous treatment well and he had partial response. The patient is currently undergoing consolidation treatment with immunotherapy with Imfinzi 1500 mg IV every 4 weeks status post 4 cycles. He continues to tolerate his treatment well with no concerning adverse effects. I recommended for the patient to proceed with cycle #5 today as planned. Because of the new left lower lobe pulmonary nodule seen on the previous CT scan of the chest a month ago, I will arrange for the patient to have repeat CT scan of the chest before his upcoming visit for further evaluation of these nodules and to rule out any disease progression. I will see him back for follow-up visit in 4 weeks for evaluation before the next cycle of his treatment. The patient was advised to call immediately if he has any other concerning symptoms in the interval. The patient voices understanding of current disease status and treatment options and is in agreement with the current care plan.  All questions were answered. The patient knows to call the clinic with any problems, questions or concerns. We can certainly see the patient much sooner if necessary.  Disclaimer: This note was dictated with voice recognition software.  Similar sounding words can inadvertently be transcribed and may not be corrected upon review.

## 2020-07-30 NOTE — Patient Instructions (Signed)
[facility] Discharge Instructions for Patients Receiving Chemotherapy  Today you received the following chemotherapy agents: durvalumab.  To help prevent nausea and vomiting after your treatment, we encourage you to take your nausea medication as directed.   If you develop nausea and vomiting that is not controlled by your nausea medication, call the clinic.   BELOW ARE SYMPTOMS THAT SHOULD BE REPORTED IMMEDIATELY:  *FEVER GREATER THAN 100.5 F  *CHILLS WITH OR WITHOUT FEVER  NAUSEA AND VOMITING THAT IS NOT CONTROLLED WITH YOUR NAUSEA MEDICATION  *UNUSUAL SHORTNESS OF BREATH  *UNUSUAL BRUISING OR BLEEDING  TENDERNESS IN MOUTH AND THROAT WITH OR WITHOUT PRESENCE OF ULCERS  *URINARY PROBLEMS  *BOWEL PROBLEMS  UNUSUAL RASH Items with * indicate a potential emergency and should be followed up as soon as possible.  Feel free to call the clinic should you have any questions or concerns. The clinic phone number is [phone].  Please show the CHEMO ALERT CARD at check-in to the Emergency Department and triage nurse.   

## 2020-07-31 ENCOUNTER — Encounter: Payer: Self-pay | Admitting: Internal Medicine

## 2020-07-31 NOTE — Progress Notes (Signed)
Good Faith Estimate and Financial Assistance application given upon discharge 07/30/20 in an envelope along with my card for any additional questions or concerns.

## 2020-08-02 ENCOUNTER — Encounter: Payer: Self-pay | Admitting: Internal Medicine

## 2020-08-02 NOTE — Progress Notes (Signed)
Friend of patient [name] [name] called regarding [employer]([facility] Financial Assistance Application) he received and follow up. Advised he does need to complete application and submit supporting documents if he is interested in applying for anymore than the 57% uninsured discount automatically given.  Also, advised proof of income is needed for him to apply for one-time $1000 [employer] grant to assist with personal expenses while in treatment.  She states she would locate the information and send over on his behalf. Advised her I would then reach out to him at his next visit to complete process.  She also believes patient has Medicaid but will call to follow up.  They have my card for any additional financial questions or concerns.

## 2020-08-22 ENCOUNTER — Encounter (HOSPITAL_COMMUNITY): Payer: Self-pay

## 2020-08-22 ENCOUNTER — Other Ambulatory Visit: Payer: Self-pay

## 2020-08-22 ENCOUNTER — Inpatient Hospital Stay (HOSPITAL_COMMUNITY)
Admission: EM | Admit: 2020-08-22 | Discharge: 2020-09-21 | DRG: 025 | Disposition: A | Discharge status: Home Health | Payer: Medicaid Other | Attending: Internal Medicine | Admitting: Internal Medicine

## 2020-08-22 ENCOUNTER — Telehealth: Payer: Self-pay

## 2020-08-22 DIAGNOSIS — C3411 Malignant neoplasm of upper lobe, right bronchus or lung: Secondary | ICD-10-CM | POA: Diagnosis present

## 2020-08-22 DIAGNOSIS — I10 Essential (primary) hypertension: Secondary | ICD-10-CM | POA: Diagnosis present

## 2020-08-22 DIAGNOSIS — J449 Chronic obstructive pulmonary disease, unspecified: Secondary | ICD-10-CM | POA: Diagnosis present

## 2020-08-22 DIAGNOSIS — R4189 Other symptoms and signs involving cognitive functions and awareness: Secondary | ICD-10-CM | POA: Diagnosis present

## 2020-08-22 DIAGNOSIS — G9389 Other specified disorders of brain: Secondary | ICD-10-CM

## 2020-08-22 DIAGNOSIS — Z923 Personal history of irradiation: Secondary | ICD-10-CM

## 2020-08-22 DIAGNOSIS — D509 Iron deficiency anemia, unspecified: Secondary | ICD-10-CM | POA: Diagnosis present

## 2020-08-22 DIAGNOSIS — Z23 Encounter for immunization: Secondary | ICD-10-CM

## 2020-08-22 DIAGNOSIS — G936 Cerebral edema: Secondary | ICD-10-CM | POA: Diagnosis present

## 2020-08-22 DIAGNOSIS — Z9221 Personal history of antineoplastic chemotherapy: Secondary | ICD-10-CM

## 2020-08-22 DIAGNOSIS — C7931 Secondary malignant neoplasm of brain: Principal | ICD-10-CM | POA: Diagnosis present

## 2020-08-22 DIAGNOSIS — R471 Dysarthria and anarthria: Secondary | ICD-10-CM | POA: Diagnosis present

## 2020-08-22 DIAGNOSIS — T451X5A Adverse effect of antineoplastic and immunosuppressive drugs, initial encounter: Secondary | ICD-10-CM

## 2020-08-22 DIAGNOSIS — Z95828 Presence of other vascular implants and grafts: Secondary | ICD-10-CM

## 2020-08-22 DIAGNOSIS — Z79899 Other long term (current) drug therapy: Secondary | ICD-10-CM

## 2020-08-22 DIAGNOSIS — Z87891 Personal history of nicotine dependence: Secondary | ICD-10-CM

## 2020-08-22 DIAGNOSIS — R531 Weakness: Secondary | ICD-10-CM | POA: Diagnosis present

## 2020-08-22 DIAGNOSIS — Z7982 Long term (current) use of aspirin: Secondary | ICD-10-CM

## 2020-08-22 DIAGNOSIS — J439 Emphysema, unspecified: Secondary | ICD-10-CM | POA: Diagnosis present

## 2020-08-22 DIAGNOSIS — G8191 Hemiplegia, unspecified affecting right dominant side: Secondary | ICD-10-CM | POA: Diagnosis present

## 2020-08-22 DIAGNOSIS — Z20822 Contact with and (suspected) exposure to covid-19: Secondary | ICD-10-CM | POA: Diagnosis present

## 2020-08-22 LAB — CBC WITH DIFFERENTIAL/PLATELET
Abs Immature Granulocytes: 0.02 10*3/uL (ref 0.00–0.07)
Basophils Absolute: 0 10*3/uL (ref 0.0–0.1)
Basophils Relative: 1 %
Eosinophils Absolute: 0.1 10*3/uL (ref 0.0–0.5)
Eosinophils Relative: 2 %
HCT: 41.3 % (ref 39.0–52.0)
Hemoglobin: 13.5 g/dL (ref 13.0–17.0)
Immature Granulocytes: 1 %
Lymphocytes Relative: 30 %
Lymphs Abs: 1.1 10*3/uL (ref 0.7–4.0)
MCH: 26.4 pg (ref 26.0–34.0)
MCHC: 32.7 g/dL (ref 30.0–36.0)
MCV: 80.7 fL (ref 80.0–100.0)
Monocytes Absolute: 0.6 10*3/uL (ref 0.1–1.0)
Monocytes Relative: 16 %
Neutro Abs: 1.9 10*3/uL (ref 1.7–7.7)
Neutrophils Relative %: 50 %
Platelets: 276 10*3/uL (ref 150–400)
RBC: 5.12 MIL/uL (ref 4.22–5.81)
RDW: 15.6 % — ABNORMAL HIGH (ref 11.5–15.5)
WBC: 3.8 10*3/uL — ABNORMAL LOW (ref 4.0–10.5)
nRBC: 0 % (ref 0.0–0.2)

## 2020-08-22 LAB — COMPREHENSIVE METABOLIC PANEL
ALT: 13 U/L (ref 0–44)
AST: 22 U/L (ref 15–41)
Albumin: 3.9 g/dL (ref 3.5–5.0)
Alkaline Phosphatase: 50 U/L (ref 38–126)
Anion gap: 8 (ref 5–15)
BUN: 5 mg/dL — ABNORMAL LOW (ref 8–23)
CO2: 22 mmol/L (ref 22–32)
Calcium: 8.7 mg/dL — ABNORMAL LOW (ref 8.9–10.3)
Chloride: 104 mmol/L (ref 98–111)
Creatinine, Ser: 0.68 mg/dL (ref 0.61–1.24)
GFR, Estimated: >60 mL/min (ref >60)
Glucose, Bld: 74 mg/dL (ref 70–99)
Potassium: 3.8 mmol/L (ref 3.5–5.1)
Sodium: 134 mmol/L — ABNORMAL LOW (ref 135–145)
Total Bilirubin: 0.4 mg/dL (ref 0.3–1.2)
Total Protein: 7.1 g/dL (ref 6.5–8.1)

## 2020-08-22 LAB — URINALYSIS, ROUTINE W REFLEX MICROSCOPIC
Bacteria, UA: NONE SEEN
Bilirubin Urine: NEGATIVE
Glucose, UA: NEGATIVE mg/dL
Ketones, ur: NEGATIVE mg/dL
Leukocytes,Ua: NEGATIVE
Nitrite: NEGATIVE
Protein, ur: NEGATIVE mg/dL
Specific Gravity, Urine: 1.008 (ref 1.005–1.030)
pH: 5 (ref 5.0–8.0)

## 2020-08-22 NOTE — Telephone Encounter (Signed)
Pt called stating he has a sudden onset of numbness, tingling and weakness on his left side. He states he can't hold a cup.  Pt has been advised to hand up and call 911.  I have also called his contact, Lawerance Bach and advised of this as well given pt is hesitant to call 911. She expressed understanding of this information and advised she is also on her way to his house.

## 2020-08-22 NOTE — ED Provider Notes (Signed)
Patient placed in Quick Look pathway, seen and evaluated   Chief Complaint: right arm and right leg pain   HPI:   Pt complains of right leg feels like it giving away and dropping things with right hand  ROS: no fever no chills  Physical Exam:   Gen: No distress  Neuro: Awake and Alert  Skin: Warm    Focused Exam:  No facial weakness from arms   Initiation of care has begun. The patient has been counseled on the process, plan, and necessity for staying for the completion/evaluation, and the remainder of the medical screening examination   Sidney Ace 08/22/20 1406    Carmin Muskrat, MD 08/23/20 1002

## 2020-08-22 NOTE — ED Triage Notes (Signed)
Pt c/o right arm, leg numbness , tingling.  . Pt has h/o anursym. Pt is also being treated for throat cancer.Pt  Reports it started yesterday around 5pm.

## 2020-08-23 ENCOUNTER — Encounter (HOSPITAL_COMMUNITY): Payer: Self-pay | Admitting: Emergency Medicine

## 2020-08-23 ENCOUNTER — Emergency Department (HOSPITAL_COMMUNITY): Payer: Medicaid Other

## 2020-08-23 ENCOUNTER — Other Ambulatory Visit (HOSPITAL_COMMUNITY): Payer: Self-pay

## 2020-08-23 ENCOUNTER — Observation Stay (HOSPITAL_COMMUNITY): Payer: Medicaid Other

## 2020-08-23 ENCOUNTER — Ambulatory Visit
Admit: 2020-08-23 | Discharge: 2020-08-23 | Disposition: A | Discharge status: Home / Self Care | Payer: Self-pay | Attending: Radiation Oncology | Admitting: Radiation Oncology

## 2020-08-23 DIAGNOSIS — C7931 Secondary malignant neoplasm of brain: Secondary | ICD-10-CM

## 2020-08-23 DIAGNOSIS — G9389 Other specified disorders of brain: Secondary | ICD-10-CM | POA: Diagnosis not present

## 2020-08-23 DIAGNOSIS — J438 Other emphysema: Secondary | ICD-10-CM

## 2020-08-23 DIAGNOSIS — C3411 Malignant neoplasm of upper lobe, right bronchus or lung: Secondary | ICD-10-CM

## 2020-08-23 LAB — CBC
HCT: 42 % (ref 39.0–52.0)
Hemoglobin: 13.5 g/dL (ref 13.0–17.0)
MCH: 25.9 pg — ABNORMAL LOW (ref 26.0–34.0)
MCHC: 32.1 g/dL (ref 30.0–36.0)
MCV: 80.5 fL (ref 80.0–100.0)
Platelets: 304 10*3/uL (ref 150–400)
RBC: 5.22 MIL/uL (ref 4.22–5.81)
RDW: 15.8 % — ABNORMAL HIGH (ref 11.5–15.5)
WBC: 5.6 10*3/uL (ref 4.0–10.5)
nRBC: 0 % (ref 0.0–0.2)

## 2020-08-23 LAB — RESP PANEL BY RT-PCR (FLU A&B, COVID) ARPGX2
Influenza A by PCR: NEGATIVE
Influenza B by PCR: NEGATIVE
SARS Coronavirus 2 by RT PCR: NEGATIVE

## 2020-08-23 LAB — BASIC METABOLIC PANEL
Anion gap: 9 (ref 5–15)
BUN: 8 mg/dL (ref 8–23)
CO2: 24 mmol/L (ref 22–32)
Calcium: 9.3 mg/dL (ref 8.9–10.3)
Chloride: 102 mmol/L (ref 98–111)
Creatinine, Ser: 0.78 mg/dL (ref 0.61–1.24)
GFR, Estimated: >60 mL/min (ref >60)
Glucose, Bld: 79 mg/dL (ref 70–99)
Potassium: 4.2 mmol/L (ref 3.5–5.1)
Sodium: 135 mmol/L (ref 135–145)

## 2020-08-23 LAB — SURGICAL PCR SCREEN
MRSA, PCR: NEGATIVE
Staphylococcus aureus: NEGATIVE

## 2020-08-23 MED ORDER — DEXAMETHASONE SODIUM PHOSPHATE 10 MG/ML IJ SOLN
10.0000 mg | Freq: Once | INTRAMUSCULAR | Status: AC
  Administered 2020-08-23: 10 mg via INTRAVENOUS
  Filled 2020-08-23: qty 1

## 2020-08-23 MED ORDER — LORAZEPAM 2 MG/ML IJ SOLN: 1.0000 mg | Freq: Once | INTRAMUSCULAR | Status: DC | PRN

## 2020-08-23 MED ORDER — ACETAMINOPHEN 325 MG PO TABS: 650.0000 mg | ORAL_TABLET | Freq: Four times a day (QID) | ORAL | Status: DC | PRN

## 2020-08-23 MED ORDER — IOHEXOL 300 MG/ML  SOLN
75.0000 mL | Freq: Once | INTRAMUSCULAR | Status: AC | PRN
  Administered 2020-08-23: 75 mL via INTRAVENOUS

## 2020-08-23 MED ORDER — ONDANSETRON HCL 4 MG PO TABS: 4.0000 mg | ORAL_TABLET | Freq: Four times a day (QID) | ORAL | Status: DC | PRN

## 2020-08-23 MED ORDER — ALBUTEROL SULFATE HFA 108 (90 BASE) MCG/ACT IN AERS: 2.0000 | INHALATION_SPRAY | RESPIRATORY_TRACT | Status: DC | PRN

## 2020-08-23 MED ORDER — HYDROCODONE-ACETAMINOPHEN 5-325 MG PO TABS
1.0000 | ORAL_TABLET | ORAL | Status: DC | PRN
  Administered 2020-08-25 – 2020-08-29 (×8): 1 via ORAL
  Filled 2020-08-23 (×8): qty 1

## 2020-08-23 MED ORDER — ACETAMINOPHEN 650 MG RE SUPP: 650.0000 mg | Freq: Four times a day (QID) | RECTAL | Status: DC | PRN

## 2020-08-23 MED ORDER — MUPIROCIN 2 % EX OINT
1.0000 "application " | TOPICAL_OINTMENT | Freq: Two times a day (BID) | CUTANEOUS | Status: AC
  Administered 2020-08-23 – 2020-08-27 (×10): 1 via NASAL
  Filled 2020-08-23 (×5): qty 22

## 2020-08-23 MED ORDER — DEXAMETHASONE 4 MG PO TABS
4.0000 mg | ORAL_TABLET | Freq: Four times a day (QID) | ORAL | Status: DC
  Administered 2020-08-23 – 2020-09-02 (×39): 4 mg via ORAL
  Filled 2020-08-23 (×41): qty 1

## 2020-08-23 MED ORDER — SODIUM CHLORIDE 0.9 % IV SOLN: INTRAVENOUS | Status: DC

## 2020-08-23 MED ORDER — SENNOSIDES-DOCUSATE SODIUM 8.6-50 MG PO TABS: 1.0000 | ORAL_TABLET | Freq: Every evening | ORAL | Status: DC | PRN

## 2020-08-23 MED ORDER — ONDANSETRON HCL 4 MG/2ML IJ SOLN: 4.0000 mg | Freq: Four times a day (QID) | INTRAMUSCULAR | Status: DC | PRN

## 2020-08-23 MED ORDER — MOMETASONE FURO-FORMOTEROL FUM 100-5 MCG/ACT IN AERO
2.0000 | INHALATION_SPRAY | Freq: Two times a day (BID) | RESPIRATORY_TRACT | Status: DC
  Administered 2020-08-23 – 2020-09-21 (×54): 2 via RESPIRATORY_TRACT
  Filled 2020-08-23 (×5): qty 8.8

## 2020-08-23 MED ORDER — UMECLIDINIUM BROMIDE 62.5 MCG/INH IN AEPB
1.0000 | INHALATION_SPRAY | Freq: Every day | RESPIRATORY_TRACT | Status: DC
  Administered 2020-08-23 – 2020-09-21 (×26): 1 via RESPIRATORY_TRACT
  Filled 2020-08-23 (×4): qty 7

## 2020-08-23 MED ORDER — ENOXAPARIN SODIUM 40 MG/0.4ML ~~LOC~~ SOLN
40.0000 mg | Freq: Every day | SUBCUTANEOUS | Status: DC
  Administered 2020-08-23 – 2020-08-28 (×6): 40 mg via SUBCUTANEOUS
  Filled 2020-08-23 (×6): qty 0.4

## 2020-08-23 MED ORDER — LEVETIRACETAM 500 MG PO TABS
500.0000 mg | ORAL_TABLET | Freq: Two times a day (BID) | ORAL | Status: DC
  Administered 2020-08-23 – 2020-08-29 (×14): 500 mg via ORAL
  Filled 2020-08-23 (×14): qty 1

## 2020-08-23 NOTE — Progress Notes (Deleted)
I

## 2020-08-23 NOTE — Progress Notes (Signed)
Inpatient Rehabilitation Admissions Coordinator  Inpatient rehab consult received. Patient not available, in Radiology. I will follow up to complete consult.  Danne Baxter, RN, MSN Rehab Admissions Coordinator [phone] 08/23/2020 2:39 PM

## 2020-08-23 NOTE — Progress Notes (Signed)
Brief oncology note:  I stopped by to see the patient today but he was off the floor in radiology.  CT of the head without contrast performed earlier today showed a 3.1 cm mass in the left posterior parietal convexity with a large area of surrounding vasogenic edema and associated mass-effect.  Is consistent metastatic disease.  Hospitalist told me he was unable to do an MRI of the brain.  Therefore a CT of the head with contrast has been ordered which is currently pending.  Recommend continuation of dexamethasone for vasogenic edema.  I also recommend radiation oncology consult for consideration of radiation to the brain.  Will stop by to see the patient tomorrow during afternoon rounds.  Mikey Bussing, DNP, AGPCNP-BC, AOCNP

## 2020-08-23 NOTE — Consult Note (Signed)
Chief Complaint   Chief Complaint  Patient presents with  . Extremity Weakness    HPI   Consult requested by: Radiation Oncology Reason for consult: Brain tumor  HPI: [name] [name] is a 65 y.o. male history of HTN, COPD and non-small cell lung cancer (s/p chemoradiation and now on immunotherapy) who presented to the ED with right sided weakness. He underwent head CT and was found to have a posterior left parietal convexity brain mass with significant vasogenic edema. 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  Radiation oncology was consulted and kindly ask we consult on the patient as well.  I came by to evaluate the patient. He is sitting up eating dinner. He is without any concerns currently. Right sided weakness as well as N/T started about 2 days ago.  He drops things due to weakness in his hand and has difficulties standing because of his right leg giving out. No left sided symptoms. No slurred speech.   Patient Active Problem List   Diagnosis Date Noted  . Brain mass 08/23/2020  . Encounter for antineoplastic immunotherapy 04/02/2020  . Chemotherapy induced neutropenia ([place]) 02/28/2020  . Malignant neoplasm of bronchus of right upper lobe ([place]) 01/03/2020  . Encounter for antineoplastic chemotherapy 01/03/2020  . Goals of care, counseling/discussion 01/03/2020  . Malnutrition of moderate degree 12/26/2019  . 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[place]) 12/25/2019  . 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 Hypertension 12/24/2019  . Hyponatremia 12/24/2019  . Tobacco use 11/02/2007  . COPD (chronic obstructive pulmonary disease) ([place]) 11/02/2007  . PULMONARY INFILTRATE INCLUDES (EOSINOPHILIA) 08/23/2007    PMH: Past Medical History:  Diagnosis Date  . 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[place]) 12/25/2019  . COPD (chronic obstructive pulmonary disease) ([place]) 11/02/2007   Qualifier: Diagnosis of  By: Melvyn Novas MD, Christena Deem   . Hypertension 12/24/2019  . Iron deficiency anemia 08/23/2007   Qualifier:  Diagnosis of  By: Jim Like      PSH: Past Surgical History:  Procedure Laterality Date  . ENDOBRONCHIAL ULTRASOUND N/A 12/27/2019   Procedure: ENDOBRONCHIAL ULTRASOUND;  Surgeon: Laurin Coder, MD;  Location: [facility];  Service: Endoscopy;  Laterality: N/A;  . FINE NEEDLE ASPIRATION  12/27/2019   Procedure: FINE NEEDLE ASPIRATION;  Surgeon: Laurin Coder, MD;  Location: [facility];  Service: Endoscopy;;  . VIDEO BRONCHOSCOPY N/A 12/27/2019   Procedure: VIDEO BRONCHOSCOPY WITHOUT FLUORO;  Surgeon: Laurin Coder, MD;  Location: [facility];  Service: Endoscopy;  Laterality: N/A;    Medications Prior to Admission  Medication Sig Dispense Refill Last Dose  . acetaminophen (TYLENOL) 500 MG tablet Take 500 mg by mouth every 6 (six) hours as needed for mild pain or moderate pain.   unk  . aspirin 81 MG EC tablet Take 1 tablet (81 mg total) by mouth daily. 30 tablet 11 08/21/2020 at 1200  . budesonide-formoterol (SYMBICORT) 80-4.5 MCG/ACT inhaler Inhale 2 puffs into the lungs 2 (two) times daily. (Patient taking differently: Inhale 2 puffs into the lungs 2 (two) times daily as needed (for flares of wheezing and/or shortness of breath).) 1 Inhaler 11 unk  . feeding supplement (ENSURE ENLIVE / ENSURE PLUS) LIQD Take 237 mLs by mouth 2 (two) times daily between meals. 237 mL 12 Past Week at Unknown time  . Multiple Vitamin (MULTIVITAMIN) tablet Take 1 tablet by mouth daily.   Past Week at Unknown time  . prochlorperazine (COMPAZINE) 10 MG tablet Take 1 tablet (10 mg total) by mouth every 6 (six)  hours as needed for nausea or vomiting. 30 tablet 0 unk  . senna-docusate (SENOKOT-S) 8.6-50 MG tablet Take 1 tablet by mouth at bedtime. (Patient taking differently: Take 1 tablet by mouth at bedtime as needed for mild constipation.)   unk  . SPIRIVA HANDIHALER 18 MCG inhalation capsule Place 1 capsule into inhaler and inhale daily.   unk  . sucralfate (CARAFATE) 1 g tablet Take 1  tablet (1 g total) by mouth 4 (four) times daily. Dissolve each tablet in 15 cc water before use. (Patient taking differently: Take 1 g by mouth See admin instructions. Dissolve 1 gram (1 tablet) in 15 ml's (1 TABLESPOONFUL) of water before taking- FOUR times a day) 120 tablet 2 Past Week at Unknown time  . VENTOLIN HFA 108 (90 Base) MCG/ACT inhaler Inhale 2 puffs into the lungs every 4 (four) hours as needed for wheezing or shortness of breath.   unk    SH: Social History   Tobacco Use  . Smoking status: Former Smoker    Packs/day: 0.50    Types: Cigarettes  . Smokeless tobacco: Never Used  . Tobacco comment: Pt states he stopped since he was discharged from the hospital  Vaping Use  . Vaping Use: Never used  Substance Use Topics  . Alcohol use: Not Currently  . Drug use: Never    MEDS: Prior to Admission medications   Medication Sig Start Date End Date Taking? Authorizing Provider  acetaminophen (TYLENOL) 500 MG tablet Take 500 mg by mouth every 6 (six) hours as needed for mild pain or moderate pain.   Yes [provider]  aspirin 81 MG EC tablet Take 1 tablet (81 mg total) by mouth daily. 04/27/18  Yes Clent Demark, PA-C  budesonide-formoterol [facility]) 80-4.5 MCG/ACT inhaler Inhale 2 puffs into the lungs 2 (two) times daily. Patient taking differently: Inhale 2 puffs into the lungs 2 (two) times daily as needed (for flares of wheezing and/or shortness of breath). 08/03/19  Yes [name], NP  feeding supplement (ENSURE ENLIVE / ENSURE PLUS) LIQD Take 237 mLs by mouth 2 (two) times daily between meals. 03/15/20  Yes [name], NP  Multiple Vitamin (MULTIVITAMIN) tablet Take 1 tablet by mouth daily.   Yes [provider]  prochlorperazine (COMPAZINE) 10 MG tablet Take 1 tablet (10 mg total) by mouth every 6 (six) hours as needed for nausea or vomiting. 01/03/20  Yes Curt Bears, MD  senna-docusate (SENOKOT-S) 8.6-50 MG tablet Take 1  tablet by mouth at bedtime. Patient taking differently: Take 1 tablet by mouth at bedtime as needed for mild constipation. 12/28/19  Yes Guilford Shi, MD  SPIRIVA HANDIHALER 18 MCG inhalation capsule Place 1 capsule into inhaler and inhale daily. 12/28/19  Yes [provider]  sucralfate (CARAFATE) 1 g tablet Take 1 tablet (1 g total) by mouth 4 (four) times daily. Dissolve each tablet in 15 cc water before use. Patient taking differently: Take 1 g by mouth See admin instructions. Dissolve 1 gram (1 tablet) in 15 ml's (1 TABLESPOONFUL) of water before taking- FOUR times a day 02/03/20  Yes Kyung Rudd, MD  VENTOLIN HFA 108 [phone] Base) MCG/ACT inhaler Inhale 2 puffs into the lungs every 4 (four) hours as needed for wheezing or shortness of breath. 12/28/19  Yes [provider]    ALLERGY: Allergies  Allergen Reactions  . Other Itching and Other (See Comments)    Seasonal allergies- Itchy eyes, runny nose, sneezing, scratchy throat  Social History   Tobacco Use  . Smoking status: Former Smoker    Packs/day: 0.50    Types: Cigarettes  . Smokeless tobacco: Never Used  . Tobacco comment: Pt states he stopped since he was discharged from the hospital  Substance Use Topics  . Alcohol use: Not Currently     Family History  Problem Relation Age of Onset  . Hypertension Other      ROS   Review of Systems  All other systems reviewed and are negative.   Exam   Vitals:   08/23/20 1908 08/23/20 1928  BP:  112/82  Pulse:  (!) 104  Resp:  17  Temp:  (!) 97.5 F (36.4 C)  SpO2: 99% 96%   General appearance: WDWN, NAD Eyes: No scleral injection Cardiovascular: Regular rate and rhythm without murmurs, rubs, gallops. No edema or variciosities. Distal pulses normal. Pulmonary: Effort normal, non-labored breathing Musculoskeletal:     Muscle tone upper extremities: Normal    Muscle tone lower extremities: Normal    Motor exam:  Upper Extremities Deltoid Bicep  Tricep Grip  Right 4/5 4+/5 4-/5 4/5  Left 5/5 5/5 5/5 5/5   Lower Extremity IP Quad PF DF EHL  Right 4-/5 4-/5 4/5 4/5 4/5  Left 5/5 5/5 5/5 5/5 5/5   Neurological Mental Status:    - Patient is awake, alert, oriented to person, place, month, year, and situation    - Patient is able to give a clear and coherent history.    - No signs of aphasia or neglect Cranial Nerves    - II: Visual Fields are full. PERRL    - III/IV/VI: EOMI without ptosis or diploplia.     - V: Facial sensation is grossly normal    - VII: Facial movement is symmetric.     - VIII: hearing is intact to voice    - X: Uvula elevates symmetrically    - XI: Shoulder shrug is symmetric.    - XII: tongue is midline without atrophy or fasciculations.  Sensory: Sensation grossly intact to LT  Results - Imaging/Labs   Results for orders placed or performed during the hospital encounter of 08/22/20 (from the past 48 hour(s))  Urinalysis, Routine w reflex microscopic Urine, Clean Catch     Status: Abnormal   Collection Time: 08/22/20  2:08 PM  Result Value Ref Range   Color, Urine YELLOW YELLOW   APPearance CLEAR CLEAR   Specific Gravity, Urine 1.008 1.005 - 1.030   pH 5.0 5.0 - 8.0   Glucose, UA NEGATIVE NEGATIVE mg/dL   Hgb urine dipstick MODERATE (A) NEGATIVE   Bilirubin Urine NEGATIVE NEGATIVE   Ketones, ur NEGATIVE NEGATIVE mg/dL   Protein, ur NEGATIVE NEGATIVE mg/dL   Nitrite NEGATIVE NEGATIVE   Leukocytes,Ua NEGATIVE NEGATIVE   RBC / HPF 0-5 0 - 5 RBC/hpf   WBC, UA 0-5 0 - 5 WBC/hpf   Bacteria, UA NONE SEEN NONE SEEN   Squamous Epithelial / LPF 0-5 0 - 5   Mucus PRESENT     Comment: Performed at [facility], [place] [address] [place], [place] [address]  CBC with Differential/Platelet     Status: Abnormal   Collection Time: 08/22/20  2:11 PM  Result Value Ref Range   WBC 3.8 (L) 4.0 - 10.5 K/uL   RBC 5.12 4.22 - 5.81 MIL/uL   Hemoglobin 13.5 13.0 - 17.0 g/dL   HCT 41.3 39.0 - 52.0 %   MCV  80.7 80.0 -  100.0 fL   MCH 26.4 26.0 - 34.0 pg   MCHC 32.7 30.0 - 36.0 g/dL   RDW 15.6 (H) 11.5 - 15.5 %   Platelets 276 150 - 400 K/uL   nRBC 0.0 0.0 - 0.2 %   Neutrophils Relative % 50 %   Neutro Abs 1.9 1.7 - 7.7 K/uL   Lymphocytes Relative 30 %   Lymphs Abs 1.1 0.7 - 4.0 K/uL   Monocytes Relative 16 %   Monocytes Absolute 0.6 0.1 - 1.0 K/uL   Eosinophils Relative 2 %   Eosinophils Absolute 0.1 0.0 - 0.5 K/uL   Basophils Relative 1 %   Basophils Absolute 0.0 0.0 - 0.1 K/uL   Immature Granulocytes 1 %   Abs Immature Granulocytes 0.02 0.00 - 0.07 K/uL    Comment: Performed at [facility] [address] [place], [place] [address]  Comprehensive metabolic panel     Status: Abnormal   Collection Time: 08/22/20  2:11 PM  Result Value Ref Range   Sodium 134 (L) 135 - 145 mmol/L   Potassium 3.8 3.5 - 5.1 mmol/L   Chloride 104 98 - 111 mmol/L   CO2 22 22 - 32 mmol/L   Glucose, Bld 74 70 - 99 mg/dL    Comment: Glucose reference range applies only to samples taken after fasting for at least 8 hours.   BUN 5 (L) 8 - 23 mg/dL   Creatinine, Ser 0.68 0.61 - 1.24 mg/dL   Calcium 8.7 (L) 8.9 - 10.3 mg/dL   Total Protein 7.1 6.5 - 8.1 g/dL   Albumin 3.9 3.5 - 5.0 g/dL   AST 22 15 - 41 U/L   ALT 13 0 - 44 U/L   Alkaline Phosphatase 50 38 - 126 U/L   Total Bilirubin 0.4 0.3 - 1.2 mg/dL   GFR, Estimated >60 >60 mL/min    Comment: (NOTE) Calculated using the CKD-EPI Creatinine Equation (2021)    Anion gap 8 5 - 15    Comment: Performed at [facility] [address], [place], [place] [address]  Resp Panel by RT-PCR (Flu A&B, Covid) Nasopharyngeal Swab     Status: None   Collection Time: 08/23/20 12:10 AM   Specimen: Nasopharyngeal Swab; Nasopharyngeal(NP) swabs in vial transport medium  Result Value Ref Range   SARS Coronavirus 2 by RT PCR NEGATIVE NEGATIVE    Comment: (NOTE) SARS-CoV-2 target nucleic acids are NOT DETECTED.  The SARS-CoV-2 RNA is generally detectable  in upper respiratory specimens during the acute phase of infection. The lowest concentration of SARS-CoV-2 viral copies this assay can detect is 138 copies/mL. A negative result does not preclude SARS-Cov-2 infection and should not be used as the sole basis for treatment or other patient management decisions. A negative result may occur with  improper specimen collection/handling, submission of specimen other than nasopharyngeal swab, presence of viral mutation(s) within the areas targeted by this assay, and inadequate number of viral copies(<138 copies/mL). A negative result must be combined with clinical observations, patient history, and epidemiological information. The expected result is Negative.  Fact Sheet for Patients:  EntrepreneurPulse.com.au  Fact Sheet for Healthcare Providers:  IncredibleEmployment.be  This test is no t yet approved or cleared by the [place] FDA and  has been authorized for detection and/or diagnosis of SARS-CoV-2 by FDA under an Emergency Use Authorization (EUA). This EUA will remain  in effect (meaning this test can be used) for the duration of the COVID-19 declaration under  Section 564(b)(1) of the Act, 21 U.S.C.section 360bbb-3(b)(1), unless the authorization is terminated  or revoked sooner.       Influenza A by PCR NEGATIVE NEGATIVE   Influenza B by PCR NEGATIVE NEGATIVE    Comment: (NOTE) The Xpert Xpress SARS-CoV-2/FLU/RSV plus assay is intended as an aid in the diagnosis of influenza from Nasopharyngeal swab specimens and should not be used as a sole basis for treatment. Nasal washings and aspirates are unacceptable for Xpert Xpress SARS-CoV-2/FLU/RSV testing.  Fact Sheet for Patients: EntrepreneurPulse.com.au  Fact Sheet for Healthcare Providers: IncredibleEmployment.be  This test is not yet approved or cleared by the [place] FDA and has been  authorized for detection and/or diagnosis of SARS-CoV-2 by FDA under an Emergency Use Authorization (EUA). This EUA will remain in effect (meaning this test can be used) for the duration of the COVID-19 declaration under Section 564(b)(1) of the Act, 21 U.S.C. section 360bbb-3(b)(1), unless the authorization is terminated or revoked.  Performed at [facility], [place] [address], [place], [place] [address]   Basic metabolic panel     Status: None   Collection Time: 08/23/20  3:11 AM  Result Value Ref Range   Sodium 135 135 - 145 mmol/L   Potassium 4.2 3.5 - 5.1 mmol/L   Chloride 102 98 - 111 mmol/L   CO2 24 22 - 32 mmol/L   Glucose, Bld 79 70 - 99 mg/dL    Comment: Glucose reference range applies only to samples taken after fasting for at least 8 hours.   BUN 8 8 - 23 mg/dL   Creatinine, Ser 0.78 0.61 - 1.24 mg/dL   Calcium 9.3 8.9 - 10.3 mg/dL   GFR, Estimated >60 >60 mL/min    Comment: (NOTE) Calculated using the CKD-EPI Creatinine Equation (2021)    Anion gap 9 5 - 15    Comment: Performed at [facility] [address] [place], [place] [address]  CBC     Status: Abnormal   Collection Time: 08/23/20  3:11 AM  Result Value Ref Range   WBC 5.6 4.0 - 10.5 K/uL   RBC 5.22 4.22 - 5.81 MIL/uL   Hemoglobin 13.5 13.0 - 17.0 g/dL   HCT 42.0 39.0 - 52.0 %   MCV 80.5 80.0 - 100.0 fL   MCH 25.9 (L) 26.0 - 34.0 pg   MCHC 32.1 30.0 - 36.0 g/dL   RDW 15.8 (H) 11.5 - 15.5 %   Platelets 304 150 - 400 K/uL   nRBC 0.0 0.0 - 0.2 %    Comment: Performed at [facility], [place] [address] [place], [place] [address]  Surgical PCR screen     Status: None   Collection Time: 08/23/20  4:28 AM   Specimen: Nasal Mucosa; Nasal Swab  Result Value Ref Range   MRSA, PCR NEGATIVE NEGATIVE   Staphylococcus aureus NEGATIVE NEGATIVE    Comment: (NOTE) The Xpert SA Assay (FDA approved for NASAL specimens in patients 11 years of age and older), is one component of a  comprehensive surveillance program. It is not intended to diagnose infection nor to guide or monitor treatment. Performed at [facility], [place] [address] [place], [place] [address]     CT Head Wo Contrast  Result Date: 08/23/2020 CLINICAL DATA:  Acute neurological deficit.  Stroke suspected. EXAM: CT HEAD WITHOUT CONTRAST TECHNIQUE: Contiguous axial images were obtained from the base of the skull through the vertex without intravenous contrast. COMPARISON:  12/28/2019  FINDINGS: Brain: New development since previous study of a mass lesion in the left posterior parietal convexity. The mass measures 2.4 x 2.4 x 3.1 cm in diameter. There is a large area of surrounding vasogenic edema throughout the left parietal lobe. There is associated mass effect with effacement of the left lateral ventricles and of the left parietal temporal sulci. No significant midline shift. Underlying cerebral atrophy. No ventricular dilatation. No acute intracranial hemorrhage. 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 Skull: Postoperative changes with right frontal and temporal craniotomy. No acute depressed skull fractures. Sinuses/Orbits: Paranasal sinuses are clear. Bilateral mastoid effusions. Other: None. IMPRESSION: 1. New development of a 3.1 cm mass lesion in the left posterior parietal convexity with a large area of surrounding vasogenic edema and associated mass effect. This is consistent with metastatic disease. 2. No acute intracranial hemorrhage. 3. Postoperative changes with right frontal and temporal craniotomy. 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 4. Bilateral mastoid effusions. Electronically Signed   By: Lucienne Capers M.D.   On: 08/23/2020 01:18   CT HEAD W CONTRAST  Result Date: 08/23/2020 CLINICAL DATA:  Acute neurological deficit. Intracranial mass lesion. Non-small cell lung cancer. EXAM: CT HEAD WITH CONTRAST TECHNIQUE: Contiguous axial images were obtained from the base of  the skull through the vertex with intravenous contrast. CONTRAST:  89mL OMNIPAQUE IOHEXOL 300 MG/ML  SOLN COMPARISON:  Noncontrast head CT earlier same day. FINDINGS: Brain: No abnormality affects the brainstem or cerebellum. 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 Volume loss of the right anterior temporal lobe. No acute parenchymal finding affects the right hemisphere. On the left, there is an intra-axial mass lesion at parietal vertex measuring 3.2 x 2.2 x 3.0 cm. Pronounced regional vasogenic edema. Metastatic disease would be the most common cause. Primary brain tumor is possible but less likely. No second intracranial lesion is identified. Vascular: Flow is present in the major vessels. Cannot evaluate the region of the aneurysm well because artifact from the clip. Skull: Right pterional craniotomy/craniectomy as described above. Sinuses/Orbits: Clear/normal Other: None IMPRESSION: 1. 3.2 x 2.2 x 3.0 cm intra-axial mass lesion at the parietal vertex on the left with regional vasogenic edema. Metastatic disease would be the most common cause. Primary brain tumor is possible but less likely. 2. 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 Cannot evaluate the region of the aneurysm well because of artifact from the clip. Electronically Signed   By: Nelson Chimes M.D.   On: 08/23/2020 14:35   DG Chest Portable 1 View  Result Date: 08/23/2020 CLINICAL DATA:  Right arm numbness, leg numbness. EXAM: PORTABLE CHEST 1 VIEW COMPARISON:  None. FINDINGS: Chronic changes in the upper lobes with scarring and emphysema. Heart is normal size. No acute confluent opacities or effusions. No acute bony abnormality. IMPRESSION: COPD/chronic changes.  No active disease. Electronically Signed   By: Rolm Baptise M.D.   On: 08/23/2020  00:53   Impression/Plan   65 y.o. male with history of non-small cell lung cancer (s/p chemoradiation and now on immunotherapy) who presented to ED with right sided weakness and found to have a left posterior parietal convexity brain mass with significant vasogenic edema. He has right sided weakness as above.  Imaging and case reviewed with Dr Kathyrn Sheriff. Patient will need to undergo surgical resection of  the mass. Patient will need radiosurgery prior to resection. Tentatively aiming for surgery early next week. Dr Kathyrn Sheriff to follow up with radiation oncology. - continue keppra, decadron  Ferne Reus, PA-C [facility]

## 2020-08-23 NOTE — Evaluation (Signed)
Physical Therapy Evaluation Patient Details Name: [name] [name] MRN: [id number] DOB: 07/04/1955 Today's Date: 08/23/2020   History of Present Illness  Pt is a 65 y.o. male who presented 4/6 with R-sided weakness and numbness. Noncontrast head CT concerning for new 3.1 cm mass lesion involving the left posterior parietal convexity with large area of surrounding vasogenic edema and associated mass-effect. This is consistent with metastatic disease. PMH: HTN, anemia, bullous emphysema, COPD, and non-small cell lung cancer status post chemoradiation and now on immunotherapy.    Clinical Impression  Pt presents with condition above and deficits mentioned below, see PT Problem List. PTA, he was living alone independently without use of AD/AE and walking up to ~1 mile to get groceries at times. Currently, pt demonstrates R UE and lower extremity weakness, dysmetria, incoordination, and decreased sensation that impacts his balance and safety with functional mobility. Pt is at high risk for falls as he has intermittent lateral LOB bouts ambulating up to only ~40 ft with a RW this date, needing minA to recover balance. As his PLOF varies greatly from his current level of function and he is motivated to improve he could greatly benefit from intensive therapies in the CIR setting to maximize his independence and safety with all functional mobility. Pt is not safe to d/c home alone at this time. Will continue to follow acutely.     Follow Up Recommendations CIR;Supervision for mobility/OOB    Equipment Recommendations  Rolling walker with 5" wheels;3in1 (PT)    Recommendations for Other Services Rehab consult;OT consult     Precautions / Restrictions Precautions Precautions: Fall Restrictions Weight Bearing Restrictions: No      Mobility  Bed Mobility Overal bed mobility: Independent             General bed mobility comments: Pt able to perform all bed mobility with bed flat and no use of  rails, but with extra time.    Transfers Overall transfer level: Needs assistance Equipment used: Rolling walker (2 wheeled) Transfers: Sit to/from Stand Sit to Stand: Min guard;Min assist         General transfer comment: Sit to stand from EOB 2x and toilet 1x with min guard-A for stabilizing pt and cuing pt for hand placement onto RW upon standing, esp for R hand.  Ambulation/Gait Ambulation/Gait assistance: Min assist Gait Distance (Feet): 40 Feet (x3 bouts of ~40 ft > ~15 ft > ~20 ft) Assistive device: Rolling walker (2 wheeled);None Gait Pattern/deviations: Step-to pattern;Decreased step length - right;Decreased stride length;Decreased dorsiflexion - right;Trunk flexed Gait velocity: reduced Gait velocity interpretation: <1.31 ft/sec, indicative of household ambulator General Gait Details: Pt ambulates with intermittent lateral LOB, needing minA to recover and cues to remain within RW and keep RW on ground as he tends to pull back on it. Improved balance with RW with increased practice compared to without RW. Decreased R step length with R foot drag noted.  Stairs            Wheelchair Mobility    Modified Rankin (Stroke Patients Only) Modified Rankin (Stroke Patients Only) Pre-Morbid Rankin Score: No symptoms Modified Rankin: Moderately severe disability     Balance Overall balance assessment: Needs assistance Sitting-balance support: No upper extremity supported;Feet supported Sitting balance-Leahy Scale: Good Sitting balance - Comments: Static sitting no LOB, supervision for safety.   Standing balance support: No upper extremity supported;Bilateral upper extremity supported;Single extremity supported;During functional activity Standing balance-Leahy Scale: Fair Standing balance comment: Able to maintain balance reaching min off  BOS with no UE support when feet were in wide stance, but intermittent LOB needing minA to recover. Improved balance with UE support.                              Pertinent Vitals/Pain Pain Assessment: No/denies pain    Home Living Family/patient expects to be discharged to:: Private residence Living Arrangements: Alone Available Help at Discharge: Neighbor;Available PRN/intermittently;Friend(s) (preacher stops by every now and then; family lives in [place], unsure if his sister could come stay with him initially) Type of Home: Apartment Home Access: Level entry     Home Layout: One level Home Equipment: Grab bars - tub/shower      Prior Function Level of Independence: Independent         Comments: Pt takes public transportation or walks to stores, he does not drive. Pt independent with all ADLs and mobility without AD/AE. No falls in past 6 months prior to onset of symptoms.     Hand Dominance   Dominant Hand: Left    Extremity/Trunk Assessment   Upper Extremity Assessment Upper Extremity Assessment: RUE deficits/detail RUE Deficits / Details: MMT scores of 4 to 4+ grossly compared to 4+ to 5 on L RUE Sensation: decreased light touch (at medial aspect of arm) RUE Coordination: decreased fine motor;decreased gross motor (dysmetria noted)    Lower Extremity Assessment Lower Extremity Assessment: RLE deficits/detail RLE Deficits / Details: MMT scores of 4 to 4+ grossly compared to 4+ to 5 on L RLE Sensation: decreased light touch (throughout) RLE Coordination: decreased fine motor;decreased gross motor (dysmetria noted)    Cervical / Trunk Assessment Cervical / Trunk Assessment: Normal  Communication   Communication: No difficulties  Cognition Arousal/Alertness: Awake/alert Behavior During Therapy: WFL for tasks assessed/performed Overall Cognitive Status: Within Functional Limits for tasks assessed                                        General Comments      Exercises     Assessment/Plan    PT Assessment Patient needs continued PT services  PT Problem  List Decreased strength;Decreased range of motion;Decreased activity tolerance;Decreased balance;Decreased mobility;Decreased coordination;Decreased knowledge of use of DME;Decreased safety awareness;Impaired sensation       PT Treatment Interventions DME instruction;Gait training;Functional mobility training;Therapeutic activities;Therapeutic exercise;Balance training;Neuromuscular re-education;Patient/family education    PT Goals (Current goals can be found in the Care Plan section)  Acute Rehab PT Goals Patient Stated Goal: to improve his R side deficits PT Goal Formulation: With patient Time For Goal Achievement: 09/06/20 Potential to Achieve Goals: Fair    Frequency Min 4X/week   Barriers to discharge        Co-evaluation               AM-PAC PT "6 Clicks" Mobility  Outcome Measure Help needed turning from your back to your side while in a flat bed without using bedrails?: None Help needed moving from lying on your back to sitting on the side of a flat bed without using bedrails?: None Help needed moving to and from a bed to a chair (including a wheelchair)?: A Little Help needed standing up from a chair using your arms (e.g., wheelchair or bedside chair)?: A Little Help needed to walk in hospital room?: A Little Help needed climbing 3-5 steps with a railing? :  A Lot 6 Click Score: 19    End of Session Equipment Utilized During Treatment: Gait belt Activity Tolerance: Patient limited by fatigue Patient left: in chair;with call bell/phone within reach;with chair alarm set Nurse Communication: Mobility status PT Visit Diagnosis: Unsteadiness on feet (R26.81);Other abnormalities of gait and mobility (R26.89);Muscle weakness (generalized) (M62.81);Difficulty in walking, not elsewhere classified (R26.2);Other symptoms and signs involving the nervous system (R29.898);Hemiplegia and hemiparesis Hemiplegia - Right/Left: Right Hemiplegia - dominant/non-dominant:  Non-dominant Hemiplegia - caused by:  (tumor)    Time: 4888-9169 PT Time Calculation (min) (ACUTE ONLY): 42 min   Charges:   PT Evaluation $PT Eval Moderate Complexity: 1 Mod PT Treatments $Gait Training: 8-22 mins $Therapeutic Activity: 8-22 mins        Moishe Spice, PT, DPT Acute Rehabilitation Services  Pager: [phone] Office: [phone]   Orvan Falconer 08/23/2020, 9:27 AM

## 2020-08-23 NOTE — ED Notes (Signed)
RN attempted to call report x1

## 2020-08-23 NOTE — ED Provider Notes (Signed)
[facility] EMERGENCY DEPARTMENT Provider Note   CSN: [id number] Arrival date & time: 08/22/20  1357     History Chief Complaint  Patient presents with  . Extremity Weakness    [name] [name] is a 65 y.o. male.  The history is provided by the patient.  Extremity Weakness This is a new problem. The current episode started 2 days ago. The problem occurs constantly. The problem has not changed since onset.Pertinent negatives include no chest pain, no abdominal pain, no headaches and no shortness of breath. Nothing aggravates the symptoms. Nothing relieves the symptoms. He has tried nothing for the symptoms. The treatment provided no relief.  Patient under treatment for lung cancer presents with RUE and RLE weakness.  LSN Monday at 830 pm and awoke Tuesday like this.      Past Medical History:  Diagnosis Date  . 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[place]) 12/25/2019  . COPD (chronic obstructive pulmonary disease) ([place]) 11/02/2007   Qualifier: Diagnosis of  By: Melvyn Novas MD, Christena Deem   . Hypertension 12/24/2019  . Iron deficiency anemia 08/23/2007   Qualifier: Diagnosis of  By: Jim Like      Patient Active Problem List   Diagnosis Date Noted  . Encounter for antineoplastic immunotherapy 04/02/2020  . Chemotherapy induced neutropenia ([place]) 02/28/2020  . 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 Encounter for antineoplastic chemotherapy 01/03/2020  . Goals of care, counseling/discussion 01/03/2020  . Malnutrition of moderate degree 12/26/2019  . 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[place]) 12/25/2019  . 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 Hypertension 12/24/2019  . Hyponatremia 12/24/2019  . Tobacco use 11/02/2007  . COPD (chronic obstructive pulmonary disease) ([place]) 11/02/2007  . PULMONARY INFILTRATE INCLUDES (EOSINOPHILIA) 08/23/2007    Past Surgical History:  Procedure Laterality Date  . ENDOBRONCHIAL ULTRASOUND N/A 12/27/2019   Procedure: ENDOBRONCHIAL ULTRASOUND;   Surgeon: Laurin Coder, MD;  Location: [facility] ENDOSCOPY;  Service: Endoscopy;  Laterality: N/A;  . FINE NEEDLE ASPIRATION  12/27/2019   Procedure: FINE NEEDLE ASPIRATION;  Surgeon: Laurin Coder, MD;  Location: [facility] ENDOSCOPY;  Service: Endoscopy;;  . VIDEO BRONCHOSCOPY N/A 12/27/2019   Procedure: VIDEO BRONCHOSCOPY WITHOUT FLUORO;  Surgeon: Laurin Coder, MD;  Location: [facility] ENDOSCOPY;  Service: Endoscopy;  Laterality: N/A;       Family History  Problem Relation Age of Onset  . Hypertension Other     Social History   Tobacco Use  . Smoking status: Former Smoker    Packs/day: 0.50    Types: Cigarettes  . Smokeless tobacco: Never Used  . Tobacco comment: Pt states he stopped since he was discharged from the hospital  Vaping Use  . Vaping Use: Never used  Substance Use Topics  . Alcohol use: Not Currently  . Drug use: Never    Home Medications Prior to Admission medications   Medication Sig Start Date End Date Taking? Authorizing Provider  acetaminophen (TYLENOL) 500 MG tablet Take 500 mg by mouth every 6 (six) hours as needed for mild pain or moderate pain.    [provider]  aspirin 81 MG EC tablet Take 1 tablet (81 mg total) by mouth daily. 04/27/18   Clent Demark, PA-C  budesonide-formoterol [employer]) 80-4.5 MCG/ACT inhaler Inhale 2 puffs into the lungs 2 (two) times daily. Patient taking differently: Inhale 2 puffs into the lungs 2 (two) times daily as needed (wheezing, SOB). 08/03/19   [name], NP  feeding supplement (ENSURE ENLIVE / ENSURE PLUS) LIQD Take 237 mLs  by mouth 2 (two) times daily between meals. 03/15/20   [name], NP  Multiple Vitamin (MULTIVITAMIN) tablet Take 1 tablet by mouth daily.    [provider]  prochlorperazine (COMPAZINE) 10 MG tablet Take 1 tablet (10 mg total) by mouth every 6 (six) hours as needed for nausea or vomiting. 01/03/20   Curt Bears, MD  senna-docusate (SENOKOT-S) 8.6-50 MG  tablet Take 1 tablet by mouth at bedtime. 12/28/19   Guilford Shi, MD  SPIRIVA HANDIHALER 18 MCG inhalation capsule 1 capsule daily. 12/28/19   [provider]  sucralfate (CARAFATE) 1 g tablet Take 1 tablet (1 g total) by mouth 4 (four) times daily. Dissolve each tablet in 15 cc water before use. 02/03/20   Kyung Rudd, MD  VENTOLIN HFA 108 [phone] Base) MCG/ACT inhaler Inhale 2 puffs into the lungs every 4 (four) hours as needed. 12/28/19   [provider]    Allergies    Patient has no known allergies.  Review of Systems   Review of Systems  Constitutional: Negative for fever.  HENT: Negative for congestion.   Eyes: Negative for visual disturbance.  Respiratory: Negative for shortness of breath.   Cardiovascular: Negative for chest pain.  Gastrointestinal: Negative for abdominal pain.  Genitourinary: Negative for difficulty urinating.  Musculoskeletal: Positive for extremity weakness. Negative for arthralgias.  Skin: Negative for rash.  Neurological: Positive for tremors and weakness. Negative for facial asymmetry and headaches.  Psychiatric/Behavioral: Negative for agitation.  All other systems reviewed and are negative.   Physical Exam Updated Vital Signs BP 127/77   Pulse 97   Temp 98 F (36.7 C) (Oral)   Resp 18   SpO2 97%   Physical Exam Vitals and nursing note reviewed.  Constitutional:      Appearance: Normal appearance. He is not diaphoretic.  HENT:     Head: Normocephalic and atraumatic.     Nose: Nose normal.  Eyes:     Extraocular Movements: Extraocular movements intact.     Conjunctiva/sclera: Conjunctivae normal.     Pupils: Pupils are equal, round, and reactive to light.  Cardiovascular:     Rate and Rhythm: Normal rate and regular rhythm.     Pulses: Normal pulses.     Heart sounds: Normal heart sounds.  Pulmonary:     Effort: Pulmonary effort is normal.     Breath sounds: Normal breath sounds.  Abdominal:     General: Abdomen is  flat. Bowel sounds are normal.     Palpations: Abdomen is soft.     Tenderness: There is no abdominal tenderness. There is no guarding.  Musculoskeletal:        General: Normal range of motion.     Cervical back: Normal range of motion and neck supple.  Skin:    General: Skin is warm and dry.     Capillary Refill: Capillary refill takes less than 2 seconds.  Neurological:     Mental Status: He is alert and oriented to person, place, and time.     Cranial Nerves: No cranial nerve deficit.     Motor: Weakness present.  Psychiatric:        Mood and Affect: Mood normal.        Behavior: Behavior normal.     ED Results / Procedures / Treatments   Labs (all labs ordered are listed, but only abnormal results are displayed) Results for orders placed or performed during the hospital encounter of 08/22/20  CBC with Differential/Platelet  Result  Value Ref Range   WBC 3.8 (L) 4.0 - 10.5 K/uL   RBC 5.12 4.22 - 5.81 MIL/uL   Hemoglobin 13.5 13.0 - 17.0 g/dL   HCT 41.3 39.0 - 52.0 %   MCV 80.7 80.0 - 100.0 fL   MCH 26.4 26.0 - 34.0 pg   MCHC 32.7 30.0 - 36.0 g/dL   RDW 15.6 (H) 11.5 - 15.5 %   Platelets 276 150 - 400 K/uL   nRBC 0.0 0.0 - 0.2 %   Neutrophils Relative % 50 %   Neutro Abs 1.9 1.7 - 7.7 K/uL   Lymphocytes Relative 30 %   Lymphs Abs 1.1 0.7 - 4.0 K/uL   Monocytes Relative 16 %   Monocytes Absolute 0.6 0.1 - 1.0 K/uL   Eosinophils Relative 2 %   Eosinophils Absolute 0.1 0.0 - 0.5 K/uL   Basophils Relative 1 %   Basophils Absolute 0.0 0.0 - 0.1 K/uL   Immature Granulocytes 1 %   Abs Immature Granulocytes 0.02 0.00 - 0.07 K/uL  Comprehensive metabolic panel  Result Value Ref Range   Sodium 134 (L) 135 - 145 mmol/L   Potassium 3.8 3.5 - 5.1 mmol/L   Chloride 104 98 - 111 mmol/L   CO2 22 22 - 32 mmol/L   Glucose, Bld 74 70 - 99 mg/dL   BUN 5 (L) 8 - 23 mg/dL   Creatinine, Ser 0.68 0.61 - 1.24 mg/dL   Calcium 8.7 (L) 8.9 - 10.3 mg/dL   Total Protein 7.1 6.5 - 8.1 g/dL    Albumin 3.9 3.5 - 5.0 g/dL   AST 22 15 - 41 U/L   ALT 13 0 - 44 U/L   Alkaline Phosphatase 50 38 - 126 U/L   Total Bilirubin 0.4 0.3 - 1.2 mg/dL   GFR, Estimated >60 >60 mL/min   Anion gap 8 5 - 15  Urinalysis, Routine w reflex microscopic Urine, Clean Catch  Result Value Ref Range   Color, Urine YELLOW YELLOW   APPearance CLEAR CLEAR   Specific Gravity, Urine 1.008 1.005 - 1.030   pH 5.0 5.0 - 8.0   Glucose, UA NEGATIVE NEGATIVE mg/dL   Hgb urine dipstick MODERATE (A) NEGATIVE   Bilirubin Urine NEGATIVE NEGATIVE   Ketones, ur NEGATIVE NEGATIVE mg/dL   Protein, ur NEGATIVE NEGATIVE mg/dL   Nitrite NEGATIVE NEGATIVE   Leukocytes,Ua NEGATIVE NEGATIVE   RBC / HPF 0-5 0 - 5 RBC/hpf   WBC, UA 0-5 0 - 5 WBC/hpf   Bacteria, UA NONE SEEN NONE SEEN   Squamous Epithelial / LPF 0-5 0 - 5   Mucus PRESENT    CT Head Wo Contrast  Result Date: 08/23/2020 CLINICAL DATA:  Acute neurological deficit.  Stroke suspected. EXAM: CT HEAD WITHOUT CONTRAST TECHNIQUE: Contiguous axial images were obtained from the base of the skull through the vertex without intravenous contrast. COMPARISON:  12/28/2019 FINDINGS: Brain: New development since previous study of a mass lesion in the left posterior parietal convexity. The mass measures 2.4 x 2.4 x 3.1 cm in diameter. There is a large area of surrounding vasogenic edema throughout the left parietal lobe. There is associated mass effect with effacement of the left lateral ventricles and of the left parietal temporal sulci. No significant midline shift. Underlying cerebral atrophy. No ventricular dilatation. No acute intracranial hemorrhage. 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 Skull: Postoperative changes with right frontal and temporal craniotomy. No acute depressed skull fractures. Sinuses/Orbits: Paranasal sinuses are clear. Bilateral mastoid effusions.  Other: None. IMPRESSION: 1. New development of a 3.1 cm mass lesion in the left posterior  parietal convexity with a large area of surrounding vasogenic edema and associated mass effect. This is consistent with metastatic disease. 2. No acute intracranial hemorrhage. 3. Postoperative changes with right frontal and temporal craniotomy. 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 4. Bilateral mastoid effusions. Electronically Signed   By: Lucienne Capers M.D.   On: 08/23/2020 01:18   DG Chest Portable 1 View  Result Date: 08/23/2020 CLINICAL DATA:  Right arm numbness, leg numbness. EXAM: PORTABLE CHEST 1 VIEW COMPARISON:  None. FINDINGS: Chronic changes in the upper lobes with scarring and emphysema. Heart is normal size. No acute confluent opacities or effusions. No acute bony abnormality. IMPRESSION: COPD/chronic changes.  No active disease. Electronically Signed   By: Rolm Baptise M.D.   On: 08/23/2020 00:53    EKG EKG Interpretation  Date/Time:  Wednesday Yoon Barca 06 2022 14:05:03 EDT Ventricular Rate:  104 PR Interval:  174 QRS Duration: 76 QT Interval:  346 QTC Calculation: 454 R Axis:   59 Text Interpretation: Sinus tachycardia Confirmed by Dory Horn) on 08/23/2020 12:06:28 AM   Radiology CT Head Wo Contrast  Result Date: 08/23/2020 CLINICAL DATA:  Acute neurological deficit.  Stroke suspected. EXAM: CT HEAD WITHOUT CONTRAST TECHNIQUE: Contiguous axial images were obtained from the base of the skull through the vertex without intravenous contrast. COMPARISON:  12/28/2019 FINDINGS: Brain: New development since previous study of a mass lesion in the left posterior parietal convexity. The mass measures 2.4 x 2.4 x 3.1 cm in diameter. There is a large area of surrounding vasogenic edema throughout the left parietal lobe. There is associated mass effect with effacement of the left lateral ventricles and of the left parietal temporal sulci. No significant midline shift. Underlying cerebral atrophy. No ventricular dilatation. No acute intracranial hemorrhage. 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 Skull: Postoperative changes with right frontal and temporal craniotomy. No acute depressed skull fractures. Sinuses/Orbits: Paranasal sinuses are clear. Bilateral mastoid effusions. Other: None. IMPRESSION: 1. New development of a 3.1 cm mass lesion in the left posterior parietal convexity with a large area of surrounding vasogenic edema and associated mass effect. This is consistent with metastatic disease. 2. No acute intracranial hemorrhage. 3. Postoperative changes with right frontal and temporal craniotomy. 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 4. Bilateral mastoid effusions. Electronically Signed   By: Lucienne Capers M.D.   On: 08/23/2020 01:18   DG Chest Portable 1 View  Result Date: 08/23/2020 CLINICAL DATA:  Right arm numbness, leg numbness. EXAM: PORTABLE CHEST 1 VIEW COMPARISON:  None. FINDINGS: Chronic changes in the upper lobes with scarring and emphysema. Heart is normal size. No acute confluent opacities or effusions. No acute bony abnormality. IMPRESSION: COPD/chronic changes.  No active disease. Electronically Signed   By: Rolm Baptise M.D.   On: 08/23/2020 00:53    Procedures Procedures   Medications Ordered in ED Medications  dexamethasone (DECADRON) injection 10 mg (has no administration in time range)    ED Course  I have reviewed the triage vital signs and the nursing notes.  Pertinent labs & imaging results that were available during my care of the patient were reviewed by me and considered in my medical decision making (see chart for details).     Final Clinical Impression(s) / ED Diagnoses Final diagnoses:  Brain metastases ([place])  Weakness    Admit to medicine    Mallery Harshman,  MD 08/23/20 1735

## 2020-08-23 NOTE — Progress Notes (Signed)
PROGRESS NOTE Brief same day note  [name] [name]  UEK:[id number] DOB: 1955-10-22 DOA: 08/22/2020 PCP: [name], NP   Chief Complain: Right arm/right leg numbness/weakness  Brief Narrative: Patient is a 65 year old male with history of COPD, non-small cell lung cancer status post chemoradiation, now on immunotherapy, active smoker who presented to the emergency department with further evaluation of right-sided weakness.  On presentation, he was hemodynamically stable.  Noncontrast head CT showed new 3.1 centimeters mass lesion involving the left posterior parietal convexity with large area of surrounding vasogenic edema and associated mass-effect.  Neurology consulted.  Started on Keppra, Decadron.  PT recommending inpatient rehab on discharge.  Will discuss with oncology after MRI report.  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  Found to have new brain mass as per CT scan concerning for metastasis.  MRI pending. Continue Decadron, start on Keppra 500 mg twice daily. We will discuss with oncology about the finding after MRI, will check if we need to consult neurosurgery.  Neurology is also following. Currently alert and oriented but intermittently confused.  Monitor mental status  Non-small cell lung cancer: Follows with Dr. Julien Nordmann, status post chemoradiation, now on chemotherapy with Imfinzi.  COPD: Currently stable, not in exacerbation.  Continue bronchodilators.  On room air.  Right-sided weakness: PT recommended CIR on discharge.         DVT prophylaxis:Lovenox Code Status: Full Family Communication: None at bedside Status is: Observation  The patient remains OBS appropriate and will d/c before 2 midnights.  Dispo: The patient is from: Home               Anticipated d/c is to: CIR              Patient currently is not medically stable to d/c.   Difficult to place patient No     Consultants: Neurology  Procedures:None  Antimicrobials:  Anti-infectives (From admission, onward)   None      Subjective:  Patient seen and examined the bedside this morning.  Hemodynamically stable.  Appears comfortable, denies any complaints.  Alert and awake and mostly oriented but has some cognitive deficits  Objective: Vitals:   08/23/20 0345 08/23/20 0552 08/23/20 0740 08/23/20 0841  BP: 118/74 118/74 132/74   Pulse: 96 96 100 (!) 111  Resp: 17 17 19 16   Temp: 98.6 F (37 C) 98.6 F (37 C) 97.7 F (36.5 C)   TempSrc: Oral Oral Oral   SpO2: 97%  97% 99%  Weight:  56.3 kg    Height:  5\' 1"  (1.549 m)      Intake/Output Summary (Last 24 hours) at 08/23/2020 1036 Last data filed at 08/23/2020 1505 Gross per 24 hour  Intake 575 ml  Output 150 ml  Net 425 ml   Filed Weights   08/23/20 0552  Weight: 56.3 kg    Examination:  General exam: Appears calm and comfortable ,Not in distress,average built HEENT:PERRL,Oral mucosa moist, Ear/Nose normal on gross exam Respiratory system: Bilateral equal air entry, normal vesicular breath sounds, no wheezes or crackles  Cardiovascular system: S1 & S2 heard, RRR. No JVD, murmurs, rubs, gallops or clicks. No pedal edema. Gastrointestinal system: Abdomen is nondistended, soft and nontender. No organomegaly or masses felt. Normal bowel sounds heard. Central nervous system: Alert  and oriented.  Weakness on the right side with motor of 4/ 5 on right upper and right lower extremity, 5/5 on left upper and left lower extremity.  No facial droop Extremities: No edema, no clubbing ,no cyanosis Skin: No rashes, lesions or ulcers,no icterus ,no pallor   Data Reviewed: I have personally reviewed following labs and imaging studies  CBC: Recent Labs  Lab 08/22/20 1411 08/23/20 0311  WBC 3.8* 5.6   NEUTROABS 1.9  --   HGB 13.5 13.5  HCT 41.3 42.0  MCV 80.7 80.5  PLT 276 195   Basic Metabolic Panel: Recent Labs  Lab 08/22/20 1411 08/23/20 0311  NA 134* 135  K 3.8 4.2  CL 104 102  CO2 22 24  GLUCOSE 74 79  BUN 5* 8  CREATININE 0.68 0.78  CALCIUM 8.7* 9.3   GFR: Estimated Creatinine Clearance: 69.9 mL/min (by C-G formula based on SCr of 0.78 mg/dL). Liver Function Tests: Recent Labs  Lab 08/22/20 1411  AST 22  ALT 13  ALKPHOS 50  BILITOT 0.4  PROT 7.1  ALBUMIN 3.9   No results for input(s): LIPASE, AMYLASE in the last 168 hours. No results for input(s): AMMONIA in the last 168 hours. Coagulation Profile: No results for input(s): INR, PROTIME in the last 168 hours. Cardiac Enzymes: No results for input(s): CKTOTAL, CKMB, CKMBINDEX, TROPONINI in the last 168 hours. BNP (last 3 results) No results for input(s): PROBNP in the last 8760 hours. HbA1C: No results for input(s): HGBA1C in the last 72 hours. CBG: No results for input(s): GLUCAP in the last 168 hours. Lipid Profile: No results for input(s): CHOL, HDL, LDLCALC, TRIG, CHOLHDL, LDLDIRECT in the last 72 hours. Thyroid Function Tests: No results for input(s): TSH, T4TOTAL, FREET4, T3FREE, THYROIDAB in the last 72 hours. Anemia Panel: No results for input(s): VITAMINB12, FOLATE, FERRITIN, TIBC, IRON, RETICCTPCT in the last 72 hours. Sepsis Labs: No results for input(s): PROCALCITON, LATICACIDVEN in the last 168 hours.  Recent Results (from the past 240 hour(s))  Resp Panel by RT-PCR (Flu A&B, Covid) Nasopharyngeal Swab     Status: None   Collection Time: 08/23/20 12:10 AM   Specimen: Nasopharyngeal Swab; Nasopharyngeal(NP) swabs in vial transport medium  Result Value Ref Range Status   SARS Coronavirus 2 by RT PCR NEGATIVE NEGATIVE Final    Comment: (NOTE) SARS-CoV-2 target nucleic acids are NOT DETECTED.  The SARS-CoV-2 RNA is generally detectable in upper respiratory specimens during the acute  phase of infection. The lowest concentration of SARS-CoV-2 viral copies this assay can detect is 138 copies/mL. A negative result does not preclude SARS-Cov-2 infection and should not be used as the sole basis for treatment or other patient management decisions. A negative result may occur with  improper specimen collection/handling, submission of specimen other than nasopharyngeal swab, presence of viral mutation(s) within the areas targeted by this assay, and inadequate number of viral copies(<138 copies/mL). A negative result must be combined with clinical observations, patient history, and epidemiological information. The expected result is Negative.  Fact Sheet for Patients:  EntrepreneurPulse.com.au  Fact Sheet for Healthcare Providers:  IncredibleEmployment.be  This test is no t yet approved or cleared by the [place] FDA and  has been authorized for detection and/or diagnosis of SARS-CoV-2 by FDA under an Emergency Use Authorization (EUA). This EUA will remain  in effect (meaning this test can be used) for the duration of the COVID-19 declaration under Section 564(b)(1) of the Act, 21 U.S.C.section 360bbb-3(b)(1), unless the authorization  is terminated  or revoked sooner.       Influenza A by PCR NEGATIVE NEGATIVE Final   Influenza B by PCR NEGATIVE NEGATIVE Final    Comment: (NOTE) The Xpert Xpress SARS-CoV-2/FLU/RSV plus assay is intended as an aid in the diagnosis of influenza from Nasopharyngeal swab specimens and should not be used as a sole basis for treatment. Nasal washings and aspirates are unacceptable for Xpert Xpress SARS-CoV-2/FLU/RSV testing.  Fact Sheet for Patients: EntrepreneurPulse.com.au  Fact Sheet for Healthcare Providers: IncredibleEmployment.be  This test is not yet approved or cleared by the [place] FDA and has been authorized for detection and/or diagnosis of  SARS-CoV-2 by FDA under an Emergency Use Authorization (EUA). This EUA will remain in effect (meaning this test can be used) for the duration of the COVID-19 declaration under Section 564(b)(1) of the Act, 21 U.S.C. section 360bbb-3(b)(1), unless the authorization is terminated or revoked.  Performed at [facility], [place] [address] [place], [place] [address]   Surgical PCR screen     Status: None   Collection Time: 08/23/20  4:28 AM   Specimen: Nasal Mucosa; Nasal Swab  Result Value Ref Range Status   MRSA, PCR NEGATIVE NEGATIVE Final   Staphylococcus aureus NEGATIVE NEGATIVE Final    Comment: (NOTE) The Xpert SA Assay (FDA approved for NASAL specimens in patients 57 years of age and older), is one component of a comprehensive surveillance program. It is not intended to diagnose infection nor to guide or monitor treatment. Performed at [facility], [place] [address], [place], [place] [address]          Radiology Studies: CT Head Wo Contrast  Result Date: 08/23/2020 CLINICAL DATA:  Acute neurological deficit.  Stroke suspected. EXAM: CT HEAD WITHOUT CONTRAST TECHNIQUE: Contiguous axial images were obtained from the base of the skull through the vertex without intravenous contrast. COMPARISON:  12/28/2019 FINDINGS: Brain: New development since previous study of a mass lesion in the left posterior parietal convexity. The mass measures 2.4 x 2.4 x 3.1 cm in diameter. There is a large area of surrounding vasogenic edema throughout the left parietal lobe. There is associated mass effect with effacement of the left lateral ventricles and of the left parietal temporal sulci. No significant midline shift. Underlying cerebral atrophy. No ventricular dilatation. No acute intracranial hemorrhage. 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 Skull: Postoperative changes with right frontal and temporal craniotomy. No acute depressed skull fractures. Sinuses/Orbits: Paranasal  sinuses are clear. Bilateral mastoid effusions. Other: None. IMPRESSION: 1. New development of a 3.1 cm mass lesion in the left posterior parietal convexity with a large area of surrounding vasogenic edema and associated mass effect. This is consistent with metastatic disease. 2. No acute intracranial hemorrhage. 3. Postoperative changes with right frontal and temporal craniotomy. 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 4. Bilateral mastoid effusions. Electronically Signed   By: Lucienne Capers M.D.   On: 08/23/2020 01:18   DG Chest Portable 1 View  Result Date: 08/23/2020 CLINICAL DATA:  Right arm numbness, leg numbness. EXAM: PORTABLE CHEST 1 VIEW COMPARISON:  None. FINDINGS: Chronic changes in the upper lobes with scarring and emphysema. Heart is normal size. No acute confluent opacities or effusions. No acute bony abnormality. IMPRESSION: COPD/chronic changes.  No active disease. Electronically Signed   By: Rolm Baptise M.D.   On: 08/23/2020 00:53        Scheduled Meds: . dexamethasone  4 mg Oral Q6H  . enoxaparin (  LOVENOX) injection  40 mg Subcutaneous Daily  . levETIRAcetam  500 mg Oral BID  . mometasone-formoterol  2 puff Inhalation BID  . mupirocin ointment  1 application Nasal BID  . umeclidinium bromide  1 puff Inhalation Daily   Continuous Infusions: . sodium chloride 75 mL/hr at 08/23/20 0303     LOS: 0 days    Time spent: 25 mins.More than 50% of that time was spent in counseling and/or coordination of care.      [name], MD [facility] P4/11/2020, 10:36 AM

## 2020-08-23 NOTE — Progress Notes (Incomplete)
Patient arrived on the unit from ED. Pt alert and oriented x4. Pt transferred self from stretcher to bed. Pt had a pair of pant on, snick

## 2020-08-23 NOTE — H&P (Addendum)
History and Physical    Vivek Grealish NAT:[id number] DOB: 07/14/1955 DOA: 08/22/2020  PCP: [name], NP   Patient coming from: Home   Chief Complaint: Right arm and leg numbness and weakness   HPI: [name] [name] is a 65 y.o. male with medical history significant for COPD and non-small cell lung cancer status post chemoradiation and now on immunotherapy, presented to the emergency department for evaluation of right-sided weakness.  Patient reports that he seemed to be in his usual state when he went to bed the night of 08/20/2020 but had difficulty ambulating the following morning due to numbness and weakness involving the right leg.  He also noted some numbness and tingling involving the right arm and was having difficulty holding onto objects with the right hand.  Symptoms failed to improve and he eventually called the cancer center for advice yesterday afternoon and was directed to call EMS for transport to the hospital.  He denies any headache, change in vision or hearing, fevers, chills, or loss of consciousness.   ED Course: Upon arrival to the ED, patient is found to be afebrile, saturating well on room air, slightly tachycardic, and with stable blood pressure.  EKG features sinus tachycardia with rate 104.  Chest x-rays negative for acute cardiopulmonary disease.  Noncontrast head CT concerning for new 3.1 cm mass lesion involving the left posterior parietal convexity with large area of surrounding vasogenic edema and associated mass-effect.  ED discussed the case with neurology recommended MRI brain with and without contrast, starting Keppra 500 mg BID, and consulting NSG in am.  Patient was treated with 10 mg IV Decadron.  Review of Systems:  All other systems reviewed and apart from HPI, are negative.  Past Medical History:  Diagnosis Date  . 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[place]) 12/25/2019  . COPD (chronic obstructive pulmonary disease) ([place]) 11/02/2007   Qualifier: Diagnosis of  By: Melvyn Novas  MD, Christena Deem   . Hypertension 12/24/2019  . Iron deficiency anemia 08/23/2007   Qualifier: Diagnosis of  By: Jim Like      Past Surgical History:  Procedure Laterality Date  . ENDOBRONCHIAL ULTRASOUND N/A 12/27/2019   Procedure: ENDOBRONCHIAL ULTRASOUND;  Surgeon: Laurin Coder, MD;  Location: [facility] END[facility]COPY;  Service: Endoscopy;  Laterality: N/A;  . FINE NEEDLE ASPIRATION  12/27/2019   Procedure: FINE NEEDLE ASPIRATION;  Surgeon: Laurin Coder, MD;  Location: [facility]COPY;  Service: Endoscopy;;  . VIDEO BRONCHOSCOPY N/A 12/27/2019   Procedure: VIDEO BRONCHOSCOPY WITHOUT FLUORO;  Surgeon: Laurin Coder, MD;  Location: [facility] ENDOS[facility];  Service: Endoscopy;  Laterality: N/A;    Social History:   reports that he has quit smoking. His smoking use included cigarettes. He smoked 0.50 packs per day. He has never used smokeless tobacco. He reports previous alcohol use. He reports that he does not use drugs.  No Known Allergies  Family History  Problem Relation Age of Onset  . Hypertension Other      Prior to Admission medications   Medication Sig Start Date End Date Taking? Authorizing Provider  acetaminophen (TYLENOL) 500 MG tablet Take 500 mg by mouth every 6 (six) hours as needed for mild pain or moderate pain.    [provider]  aspirin 81 MG EC tablet Take 1 tablet (81 mg total) by mouth daily. 04/27/18   Clent Demark, PA-C  budesonide-formoterol [facility]) 80-4.5 MCG/ACT inhaler Inhale 2 puffs into the lungs 2 (two) times daily. Patient taking differently: Inhale 2 puffs into the lungs 2 (  two) times daily as needed (wheezing, SOB). 08/03/19   [name], NP  feeding supplement (ENSURE ENLIVE / ENSURE PLUS) LIQD Take 237 mLs by mouth 2 (two) times daily between meals. 03/15/20   [name], NP  Multiple Vitamin (MULTIVITAMIN) tablet Take 1 tablet by mouth daily.    [provider]  prochlorperazine (COMPAZINE) 10 MG tablet  Take 1 tablet (10 mg total) by mouth every 6 (six) hours as needed for nausea or vomiting. 01/03/20   Curt Bears, MD  senna-docusate (SENOKOT-S) 8.6-50 MG tablet Take 1 tablet by mouth at bedtime. 12/28/19   Guilford Shi, MD  SPIRIVA HANDIHALER 18 MCG inhalation capsule 1 capsule daily. 12/28/19   [provider]  sucralfate (CARAFATE) 1 g tablet Take 1 tablet (1 g total) by mouth 4 (four) times daily. Dissolve each tablet in 15 cc water before use. 02/03/20   Kyung Rudd, MD  VENTOLIN HFA 108 [phone] Base) MCG/ACT inhaler Inhale 2 puffs into the lungs every 4 (four) hours as needed. 12/28/19   [provider]    Physical Exam: Vitals:   08/22/20 2335 08/23/20 0115 08/23/20 0145 08/23/20 0155  BP: 119/79 127/77 (!) 144/80 (!) 144/80  Pulse: (!) 102 97 94 (!) 104  Resp: 16 18  20   Temp:      TempSrc:      SpO2: 97% 97% 96% 97%    Constitutional: NAD, calm  Eyes: PERTLA, lids and conjunctivae normal ENMT: Mucous membranes are moist. Posterior pharynx clear of any exudate or lesions.   Neck: normal, supple, no masses, no thyromegaly Respiratory: clear to auscultation bilaterally, no wheezing, no crackles. No accessory muscle use.  Cardiovascular: S1 & S2 heard, regular rate and rhythm. No extremity edema.   Abdomen: No distension, no tenderness, soft. Bowel sounds active.  Musculoskeletal: no clubbing / cyanosis. No joint deformity upper and lower extremities.   Skin: no significant rashes, lesions, ulcers. Warm, dry, well-perfused. Neurologic: CN 2-12 grossly intact. Sensation to light touch diminished at RUE and RLE. Strength 5/5 in all 4 limbs.  Psychiatric: Alert and oriented to person, place, and situation. Pleasant and cooperative.    Labs and Imaging on Admission: I have personally reviewed following labs and imaging studies  CBC: Recent Labs  Lab 08/22/20 1411  WBC 3.8*  NEUTROABS 1.9  HGB 13.5  HCT 41.3  MCV 80.7  PLT 485   Basic Metabolic  Panel: Recent Labs  Lab 08/22/20 1411  NA 134*  K 3.8  CL 104  CO2 22  GLUCOSE 74  BUN 5*  CREATININE 0.68  CALCIUM 8.7*   GFR: CrCl cannot be calculated (Unknown ideal weight.). Liver Function Tests: Recent Labs  Lab 08/22/20 1411  AST 22  ALT 13  ALKPHOS 50  BILITOT 0.4  PROT 7.1  ALBUMIN 3.9   No results for input(s): LIPASE, AMYLASE in the last 168 hours. No results for input(s): AMMONIA in the last 168 hours. Coagulation Profile: No results for input(s): INR, PROTIME in the last 168 hours. Cardiac Enzymes: No results for input(s): CKTOTAL, CKMB, CKMBINDEX, TROPONINI in the last 168 hours. BNP (last 3 results) No results for input(s): PROBNP in the last 8760 hours. HbA1C: No results for input(s): HGBA1C in the last 72 hours. CBG: No results for input(s): GLUCAP in the last 168 hours. Lipid Profile: No results for input(s): CHOL, HDL, LDLCALC, TRIG, CHOLHDL, LDLDIRECT in the last 72 hours. Thyroid Function Tests: No results for input(s): TSH, T4TOTAL, FREET4, T3FREE, THYROIDAB  in the last 72 hours. Anemia Panel: No results for input(s): VITAMINB12, FOLATE, FERRITIN, TIBC, IRON, RETICCTPCT in the last 72 hours. Urine analysis:    Component Value Date/Time   COLORURINE YELLOW 08/22/2020 1408   APPEARANCEUR CLEAR 08/22/2020 1408   LABSPEC 1.008 08/22/2020 1408   PHURINE 5.0 08/22/2020 1408   GLUCOSEU NEGATIVE 08/22/2020 1408   HGBUR MODERATE (A) 08/22/2020 1408   BILIRUBINUR NEGATIVE 08/22/2020 1408   KETONESUR NEGATIVE 08/22/2020 1408   PROTEINUR NEGATIVE 08/22/2020 1408   UROBILINOGEN 1.0 05/28/2008 1859   NITRITE NEGATIVE 08/22/2020 1408   LEUKOCYTESUR NEGATIVE 08/22/2020 1408   Sepsis Labs: @LABRCNTIP (procalcitonin:4,lacticidven:4) )No results found for this or any previous visit (from the past 240 hour(s)).   Radiological Exams on Admission: CT Head Wo Contrast  Result Date: 08/23/2020 CLINICAL DATA:  Acute neurological deficit.  Stroke  suspected. EXAM: CT HEAD WITHOUT CONTRAST TECHNIQUE: Contiguous axial images were obtained from the base of the skull through the vertex without intravenous contrast. COMPARISON:  12/28/2019 FINDINGS: Brain: New development since previous study of a mass lesion in the left posterior parietal convexity. The mass measures 2.4 x 2.4 x 3.1 cm in diameter. There is a large area of surrounding vasogenic edema throughout the left parietal lobe. There is associated mass effect with effacement of the left lateral ventricles and of the left parietal temporal sulci. No significant midline shift. Underlying cerebral atrophy. No ventricular dilatation. No acute intracranial hemorrhage. 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 Skull: Postoperative changes with right frontal and temporal craniotomy. No acute depressed skull fractures. Sinuses/Orbits: Paranasal sinuses are clear. Bilateral mastoid effusions. Other: None. IMPRESSION: 1. New development of a 3.1 cm mass lesion in the left posterior parietal convexity with a large area of surrounding vasogenic edema and associated mass effect. This is consistent with metastatic disease. 2. No acute intracranial hemorrhage. 3. Postoperative changes with right frontal and temporal craniotomy. 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 4. Bilateral mastoid effusions. Electronically Signed   By: Lucienne Capers M.D.   On: 08/23/2020 01:18   DG Chest Portable 1 View  Result Date: 08/23/2020 CLINICAL DATA:  Right arm numbness, leg numbness. EXAM: PORTABLE CHEST 1 VIEW COMPARISON:  None. FINDINGS: Chronic changes in the upper lobes with scarring and emphysema. Heart is normal size. No acute confluent opacities or effusions. No acute bony abnormality. IMPRESSION: COPD/chronic changes.  No active disease. Electronically Signed   By: Rolm Baptise M.D.   On: 08/23/2020 00:53    EKG: Independently reviewed. Sinus tachycardia, rate 104.   Assessment/Plan  1. Brain  mass  - Presents with 2 days of right-sided numbness and weakness and is found to have new brain mass concerning for metastasis  - Continue Decadron, start Keppra 500 BID, check MRI brain with and without contrast, consult neurosurgery in am   2. Non-small cell lung cancer  - Followed by Dr. Julien [facility]mann at [facility], is s/p chemoradiation, and now on immunotherapy with Imfinzi  - New brain lesion concerning for metastasis   3. COPD  - Stable, no SOB or wheezing on admission  - Continue ICS/LABA, Spiriva, and as-needed albuterol     DVT prophylaxis: Lovenox  Code Status: Full  Level of Care: Level of care: Telemetry Medical Family Communication: None available  Disposition Plan:  Patient is from: home   Anticipated d/c is to: TBD Anticipated d/c date is: Possibly as early as 08/24/20 Patient currently: Pending MRI brain, PT assessment  Consults called: None  Admission status: Observation     Vianne Bulls, MD [facility]  08/23/2020, 2:47 AM

## 2020-08-23 NOTE — Plan of Care (Signed)

## 2020-08-23 NOTE — Progress Notes (Signed)
  Radiation Oncology         [phone]) [phone] ________________________________  Name: [name] [name] MRN: [id number]  Date: 08/22/2020  DOB: 1955-09-17  Chart Note:  I reviewed this patient's most recent findings and wanted to take a minute to document my impression.  He has a 3.2 cm new brain lesion consistent with brain metastasis with personal history of non-small cell lung cancer.  Agree with continuing steroids.  We have asked neurosurgery (Dr. Kathyrn Sheriff) to review imaging as we determine potential interventions, likely to include stereotactic radiosurgery with or without surgical resection.  Will follow and add formal consult note following some multidisciplinary discussion.  ________________________________  Sheral Apley Tammi Klippel, M.D.

## 2020-08-24 ENCOUNTER — Other Ambulatory Visit: Payer: Self-pay | Admitting: Neurosurgery

## 2020-08-24 ENCOUNTER — Ambulatory Visit
Admit: 2020-08-24 | Discharge: 2020-08-24 | Disposition: A | Discharge status: Home / Self Care | Payer: Self-pay | Attending: Radiation Oncology | Admitting: Radiation Oncology

## 2020-08-24 ENCOUNTER — Inpatient Hospital Stay
Admit: 2020-08-24 | Discharge: 2020-08-24 | Disposition: A | Discharge status: Home / Self Care | Payer: Self-pay | Attending: Urology | Admitting: Urology

## 2020-08-24 ENCOUNTER — Other Ambulatory Visit: Payer: Self-pay | Admitting: Radiation Therapy

## 2020-08-24 DIAGNOSIS — I10 Essential (primary) hypertension: Secondary | ICD-10-CM | POA: Diagnosis present

## 2020-08-24 DIAGNOSIS — G936 Cerebral edema: Secondary | ICD-10-CM | POA: Diagnosis present

## 2020-08-24 DIAGNOSIS — C7931 Secondary malignant neoplasm of brain: Secondary | ICD-10-CM | POA: Diagnosis present

## 2020-08-24 DIAGNOSIS — R531 Weakness: Secondary | ICD-10-CM | POA: Diagnosis present

## 2020-08-24 DIAGNOSIS — G9389 Other specified disorders of brain: Secondary | ICD-10-CM

## 2020-08-24 DIAGNOSIS — Z23 Encounter for immunization: Secondary | ICD-10-CM | POA: Diagnosis not present

## 2020-08-24 DIAGNOSIS — Z923 Personal history of irradiation: Secondary | ICD-10-CM | POA: Diagnosis not present

## 2020-08-24 DIAGNOSIS — Z9221 Personal history of antineoplastic chemotherapy: Secondary | ICD-10-CM | POA: Diagnosis not present

## 2020-08-24 DIAGNOSIS — R4189 Other symptoms and signs involving cognitive functions and awareness: Secondary | ICD-10-CM | POA: Diagnosis present

## 2020-08-24 DIAGNOSIS — J439 Emphysema, unspecified: Secondary | ICD-10-CM | POA: Diagnosis present

## 2020-08-24 DIAGNOSIS — Z7982 Long term (current) use of aspirin: Secondary | ICD-10-CM | POA: Diagnosis not present

## 2020-08-24 DIAGNOSIS — Z79899 Other long term (current) drug therapy: Secondary | ICD-10-CM | POA: Diagnosis not present

## 2020-08-24 DIAGNOSIS — Z20822 Contact with and (suspected) exposure to covid-19: Secondary | ICD-10-CM | POA: Diagnosis present

## 2020-08-24 DIAGNOSIS — Z87891 Personal history of nicotine dependence: Secondary | ICD-10-CM | POA: Diagnosis not present

## 2020-08-24 DIAGNOSIS — D509 Iron deficiency anemia, unspecified: Secondary | ICD-10-CM | POA: Diagnosis present

## 2020-08-24 DIAGNOSIS — C3411 Malignant neoplasm of upper lobe, right bronchus or lung: Secondary | ICD-10-CM | POA: Diagnosis present

## 2020-08-24 DIAGNOSIS — G8191 Hemiplegia, unspecified affecting right dominant side: Secondary | ICD-10-CM | POA: Diagnosis present

## 2020-08-24 DIAGNOSIS — R471 Dysarthria and anarthria: Secondary | ICD-10-CM | POA: Diagnosis present

## 2020-08-24 LAB — CBC
HCT: 38.2 % — ABNORMAL LOW (ref 39.0–52.0)
Hemoglobin: 12.4 g/dL — ABNORMAL LOW (ref 13.0–17.0)
MCH: 25.5 pg — ABNORMAL LOW (ref 26.0–34.0)
MCHC: 32.5 g/dL (ref 30.0–36.0)
MCV: 78.4 fL — ABNORMAL LOW (ref 80.0–100.0)
Platelets: 273 10*3/uL (ref 150–400)
RBC: 4.87 MIL/uL (ref 4.22–5.81)
RDW: 15.4 % (ref 11.5–15.5)
WBC: 5.4 10*3/uL (ref 4.0–10.5)
nRBC: 0 % (ref 0.0–0.2)

## 2020-08-24 LAB — BASIC METABOLIC PANEL
Anion gap: 11 (ref 5–15)
BUN: 8 mg/dL (ref 8–23)
CO2: 21 mmol/L — ABNORMAL LOW (ref 22–32)
Calcium: 9.3 mg/dL (ref 8.9–10.3)
Chloride: 103 mmol/L (ref 98–111)
Creatinine, Ser: 1.02 mg/dL (ref 0.61–1.24)
GFR, Estimated: >60 mL/min (ref >60)
Glucose, Bld: 160 mg/dL — ABNORMAL HIGH (ref 70–99)
Potassium: 4.1 mmol/L (ref 3.5–5.1)
Sodium: 135 mmol/L (ref 135–145)

## 2020-08-24 NOTE — Progress Notes (Signed)
  Radiation Oncology         (336) [phone] ________________________________  Name: [name] [name] MRN: [id number]  Date: 08/24/2020  DOB: [birth year]  INPATIENT  SIMULATION AND TREATMENT PLANNING NOTE    ICD-10-CM   1. 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  Identity was confirmed.  All relevant records and images related to the planned course of therapy were reviewed.  The patient freely provided informed written consent to proceed with treatment after reviewing the details related to the planned course of therapy. The consent form was witnessed and verified by the simulation staff. Intravenous access was established for contrast administration. Then, the patient was set-up in a stable reproducible supine position for radiation therapy.  A relocatable thermoplastic stereotactic head frame was fabricated for precise immobilization.  CT images were obtained.  Surface markings were placed.  The CT images were loaded into the planning software and fused with the patient's targeting MRI scan.  Then the target and avoidance structures were contoured.  Treatment planning then occurred.  The radiation prescription was entered and confirmed.  I have requested 3D planning  I have requested a DVH of the following structures: Brain stem, brain, left eye, right eye, lenses, optic chiasm, target volumes, uninvolved brain, and normal tissue.    SPECIAL TREATMENT PROCEDURE:  The planned course of therapy using radiation constitutes a special treatment procedure. Special care is required in the management of this patient for the following reasons. This treatment constitutes a Special Treatment Procedure for the following reason: High dose per fraction requiring special monitoring for increased toxicities of treatment including daily imaging.  The special nature of  the planned course of radiotherapy will require increased physician supervision and oversight to ensure patient's safety with optimal treatment outcomes.  PLAN:  The patient will receive 18 Gy in 1 fraction pre-op to be followed by resection.  ________________________________  Sheral Apley Tammi Klippel, M.D.

## 2020-08-24 NOTE — Progress Notes (Signed)
Patient off unit to [facility] with [facility] via stretcher; to return after finished.

## 2020-08-24 NOTE — Progress Notes (Signed)
OT Cancellation Note  Patient Details Name: [name] [name] MRN: [id number] DOB: February 23, 1956   Cancelled Treatment:    Reason Eval/Treat Not Completed: Patient at procedure or test/ unavailable (transfer to [facility] for treatment but to return) chemotherapy   Jeri Modena 08/24/2020, 11:25 AM   Fleeta Emmer, OTR/L  Acute Rehabilitation Services Pager: [phone] Office: [phone] .

## 2020-08-24 NOTE — Progress Notes (Signed)
  NEUROSURGERY PROGRESS NOTE   No issues overnight. Pt without significant complaint this am. Unchanged subjective right UE/LE weakness.  EXAM:  BP (!) 92/57   Pulse (!) 102   Temp 98.6 F (37 C) (Oral)   Resp 17   Ht 5\' 1"  (1.549 m)   Wt 56.3 kg   SpO2 98%   BMI 23.45 kg/m   Awake, alert, oriented  Speech fluent, appropriate  CN grossly intact  4+/5 diffusely RUE/RLE (+) drift on right Extinction to LT on right  IMPRESSION:  65 y.o. male with hx of NSCLCA presenting with several weeks of mild right hemiparesis. Imaging demonstrates enhancing lesion of the left parietal lobe with significant associated edema. Careful review appears to indicate the tumor to be posterior to the primary motor cortex. Given the size, he will require surgical resection with adjuvant (pre- or post-op) radiation.  PLAN: - Will discuss the case with rad onc regarding timing of adjuvant treatment - Plan on stereotactic left frontoparietal craniotomy next week.   I have reviewed the imaging findings and likely diagnosis with the patient. We discussed the need for surgical resection with either pre- or post-op radiation. We discussed the potential risks of the surgery primarily to be worsening right-sided weakness but also including SZ, hydrocephalus, and infection. We discussed the expected postoperative course. All his questions were answered and he is agreeable to the plan above.    Consuella Lose, MD [facility]

## 2020-08-24 NOTE — Progress Notes (Signed)
Patient back to room from [facility].  No changes noted in assessment.

## 2020-08-24 NOTE — Progress Notes (Signed)
Inpatient Rehabilitation Admissions Coordinator  Noted Medical and surgical plan workup ongoing. I will follow his case at a distance to assist with planning when appropriate.  Danne Baxter, RN, MSN Rehab Admissions Coordinator [phone] 08/24/2020 8:48 AM

## 2020-08-24 NOTE — Progress Notes (Incomplete)
Radiation Oncology         (336) [phone] ________________________________  Initial Inpatient Consultation  Name: [name] [name] MRN: [id number]  Date of Service: 08/24/2020 DOB: 1956-04-20  ZJ:IRCVELF, Milford Cage, NP  [name], MD   REFERRING PHYSICIAN: [name], MD  DIAGNOSIS: There were no encounter diagnoses.  No diagnosis found.  HISTORY OF PRESENT ILLNESS: [name] [name] is a 65 y.o. male seen at the request of Dr. Marland Kitchen He has been under the care of Dr. 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 He was treated with concurrent chemoradiation, with daily IMRT under Dr. Lisbeth Renshaw and weekly carboplatin/paclitaxel (7 cycles). He is now on consolidation treatment with immunotherapy consisting of Imfinzi every 4 weeks, started 04/09/20. His most recent chest CT on 07/02/20 showed mixed response to therapy.  More recently, he contacted Dr. Worthy Flank office on 08/23/19 with concerns regarding right upper and lower extremity weakness. 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 Due to the aneurysm clip, he was unable to undergo MRI. They proceeded with contrast head CT showing: 3.2 cm intra-axial mass lesion at parietal vertex on left with regional vasogenic edema.  PREVIOUS RADIATION THERAPY: Yes  01/17/20 - 03/02/20: The patient was treated to the disease within the right lung initially to a dose of 60 Gy using a 3 field, IMRT technique. The patient then received a cone down boost treatment for an additional 6 Gy. This yielded a final total dose of 66 Gy.  PAST MEDICAL HISTORY:  Past Medical History:  Diagnosis Date  .  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 COPD (chronic obstructive pulmonary disease) ([place]) 11/02/2007   Qualifier: Diagnosis of  By: Melvyn Novas MD, Christena Deem   . Hypertension 12/24/2019  . Iron deficiency anemia 08/23/2007   Qualifier: Diagnosis of  By: Jim Like        PAST SURGICAL HISTORY: Past Surgical History:  Procedure Laterality Date  . ENDOBRONCHIAL ULTRASOUND N/A 12/27/2019   Procedure: ENDOBRONCHIAL ULTRASOUND;  Surgeon: Laurin Coder, MD;  Location: [facility] ENDOSCOPY;  Service: Endoscopy;  Laterality: N/A;  . FINE NEEDLE ASPIRATION  12/27/2019   Procedure: FINE NEEDLE ASPIRATION;  Surgeon: Laurin Coder, MD;  Location: [facility] ENDOSCOPY;  Service: Endoscopy;;  . VIDEO BRONCHOSCOPY N/A 12/27/2019   Procedure: VIDEO BRONCHOSCOPY WITHOUT FLUORO;  Surgeon: Laurin Coder, MD;  Location: [facility] ENDOSCOPY;  Service: Endoscopy;  Laterality: N/A;    FAMILY HISTORY:  Family History  Problem Relation Age of Onset  . Hypertension Other     SOCIAL HISTORY:  Social History   Socioeconomic History  . Marital status: Single    Spouse name: Not on file  . Number of children: Not on file  . Years of education: Not on file  . Highest education level: Not on file  Occupational History  . Not on file  Tobacco Use  . Smoking status: Former Smoker    Packs/day: 0.50    Types: Cigarettes  . Smokeless tobacco: Never Used  . Tobacco comment: Pt states he stopped since he was discharged from the hospital  Vaping Use  . Vaping Use: Never used  Substance and Sexual Activity  . Alcohol use: Not Currently  . Drug  use: Never  . Sexual activity: Not Currently  Other Topics Concern  . Not on file  Social History Narrative  . Not on file   Social Determinants of Health   Financial Resource Strain: High Risk  . Difficulty of Paying Living Expenses: Hard  Food Insecurity: No Food Insecurity  . Worried About Charity fundraiser in the Last Year: Never true  . Ran Out of Food in the Last  Year: Never true  Transportation Needs: Unmet Transportation Needs  . Lack of Transportation (Medical): Yes  . Lack of Transportation (Non-Medical): Yes  Physical Activity: Not on file  Stress: Not on file  Social Connections: Not on file  Intimate Partner Violence: Not on file    ALLERGIES: Other  MEDICATIONS:  No current facility-administered medications for this encounter.   No current outpatient medications on file.   Facility-Administered Medications Ordered in Other Encounters  Medication Dose Route Frequency Provider Last Rate Last Admin  . acetaminophen (TYLENOL) tablet 650 mg  650 mg Oral Q6H PRN Opyd, Ilene Qua, MD       Or  . acetaminophen (TYLENOL) suppository 650 mg  650 mg Rectal Q6H PRN Opyd, Ilene Qua, MD      . albuterol (VENTOLIN HFA) 108 (90 Base) MCG/ACT inhaler 2 puff  2 puff Inhalation Q4H PRN Opyd, Ilene Qua, MD      . dexamethasone (DECADRON) tablet 4 mg  4 mg Oral Q6H Opyd, Ilene Qua, MD   4 mg at 08/24/20 0851  . enoxaparin (LOVENOX) injection 40 mg  40 mg Subcutaneous Daily Opyd, Ilene Qua, MD   40 mg at 08/24/20 0851  . HYDROcodone-acetaminophen (NORCO/VICODIN) 5-325 MG per tablet 1 tablet  1 tablet Oral Q4H PRN Opyd, Ilene Qua, MD      . levETIRAcetam (KEPPRA) tablet 500 mg  500 mg Oral BID Opyd, Ilene Qua, MD   500 mg at 08/24/20 0851  . LORazepam (ATIVAN) injection 1 mg  1 mg Intravenous Once PRN Opyd, Ilene Qua, MD      . mometasone-formoterol (DULERA) 100-5 MCG/ACT inhaler 2 puff  2 puff Inhalation BID Opyd, Ilene Qua, MD   2 puff at 08/24/20 0745  . mupirocin ointment (BACTROBAN) 2 % 1 application  1 application Nasal BID Opyd, Ilene Qua, MD   1 application at 03/50/09 0856  . ondansetron (ZOFRAN) tablet 4 mg  4 mg Oral Q6H PRN Opyd, Ilene Qua, MD       Or  . ondansetron (ZOFRAN) injection 4 mg  4 mg Intravenous Q6H PRN Opyd, Ilene Qua, MD      . senna-docusate (Senokot-S) tablet 1 tablet  1 tablet Oral QHS PRN Opyd, Ilene Qua, MD      .  umeclidinium bromide (INCRUSE ELLIPTA) 62.5 MCG/INH 1 puff  1 puff Inhalation Daily Opyd, Ilene Qua, MD   1 puff at 08/24/20 0745    REVIEW OF SYSTEMS:  On review of systems, the patient reports that he is doing well overall. He denies any chest pain, shortness of breath, cough, fevers, chills, night sweats, unintended weight changes. He denies any bowel or bladder disturbances, and denies abdominal pain, nausea or vomiting. He reports ***. A complete review of systems is obtained and is otherwise negative.    PHYSICAL EXAM:  Wt Readings from Last 3 Encounters:  08/23/20 124 lb 1.9 oz (56.3 kg)  07/30/20 124 lb 1.6 oz (56.3 kg)  07/05/20 125 lb 14.4 oz (57.1 kg)   Temp Readings from Last 3  Encounters:  08/24/20 98.6 F (37 C) (Oral)  07/30/20 (!) 96.4 F (35.8 C) (Tympanic)  07/05/20 97.8 F (36.6 C) (Tympanic)   BP Readings from Last 3 Encounters:  08/24/20 (!) 92/57  07/30/20 128/80  07/05/20 113/76   Pulse Readings from Last 3 Encounters:  08/24/20 (!) 102  07/30/20 94  07/05/20 95    /10  In general this is a well appearing *** in no acute distress. He is alert and oriented x4 and appropriate throughout the examination. HEENT reveals that the patient is normocephalic, atraumatic. EOMs are intact. PERRLA. Skin is intact without any evidence of gross lesions. Cardiovascular exam reveals a regular rate and rhythm, no clicks rubs or murmurs are auscultated. Chest is clear to auscultation bilaterally. Lymphatic assessment is performed and does not reveal any adenopathy in the cervical, supraclavicular, axillary, or inguinal chains. Abdomen has active bowel sounds in all quadrants and is intact. The abdomen is soft, non tender, non distended. Lower extremities are negative for pretibial pitting edema, deep calf tenderness, cyanosis or clubbing.   KPS = ***  100 - Normal; no complaints; no evidence of disease. 90   - Able to carry on normal activity; minor signs or symptoms of  disease. 80   - Normal activity with effort; some signs or symptoms of disease. 25   - Cares for self; unable to carry on normal activity or to do active work. 60   - Requires occasional assistance, but is able to care for most of his personal needs. 50   - Requires considerable assistance and frequent medical care. 66   - Disabled; requires special care and assistance. 72   - Severely disabled; hospital admission is indicated although death not imminent. 31   - Very sick; hospital admission necessary; active supportive treatment necessary. 10   - Moribund; fatal processes progressing rapidly. 0     - Dead  Karnofsky DA, Abelmann Yutan, Craver LS and Burchenal [facility] [phone]) The use of the nitrogen mustards in the palliative treatment of carcinoma: with particular reference to bronchogenic carcinoma Cancer 1 634-56  LABORATORY DATA:  Lab Results  Component Value Date   WBC 5.4 08/24/2020   HGB 12.4 (L) 08/24/2020   HCT 38.2 (L) 08/24/2020   MCV 78.4 (L) 08/24/2020   PLT 273 08/24/2020   Lab Results  Component Value Date   NA 135 08/24/2020   K 4.1 08/24/2020   CL 103 08/24/2020   CO2 21 (L) 08/24/2020   Lab Results  Component Value Date   ALT 13 08/22/2020   AST 22 08/22/2020   ALKPHOS 50 08/22/2020   BILITOT 0.4 08/22/2020     RADIOGRAPHY: CT Head Wo Contrast  Result Date: 08/23/2020 CLINICAL DATA:  Acute neurological deficit.  Stroke suspected. EXAM: CT HEAD WITHOUT CONTRAST TECHNIQUE: Contiguous axial images were obtained from the base of the skull through the vertex without intravenous contrast. COMPARISON:  12/28/2019 FINDINGS: Brain: New development since previous study of a mass lesion in the left posterior parietal convexity. The mass measures 2.4 x 2.4 x 3.1 cm in diameter. There is a large area of surrounding vasogenic edema throughout the left parietal lobe. There is associated mass effect with effacement of the left lateral ventricles and of the left parietal temporal sulci.  No significant midline shift. Underlying cerebral atrophy. No ventricular dilatation. No acute intracranial hemorrhage. 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 Skull: Postoperative changes with right frontal and temporal craniotomy. No acute depressed skull fractures. Sinuses/Orbits:  Paranasal sinuses are clear. Bilateral mastoid effusions. Other: None. IMPRESSION: 1. New development of a 3.1 cm mass lesion in the left posterior parietal convexity with a large area of surrounding vasogenic edema and associated mass effect. This is consistent with metastatic disease. 2. No acute intracranial hemorrhage. 3. Postoperative changes with right frontal and temporal craniotomy. 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 4. Bilateral mastoid effusions. Electronically Signed   By: Lucienne Capers M.D.   On: 08/23/2020 01:18   CT HEAD W CONTRAST  Result Date: 08/23/2020 CLINICAL DATA:  Acute neurological deficit. Intracranial mass lesion. Non-small cell lung cancer. EXAM: CT HEAD WITH CONTRAST TECHNIQUE: Contiguous axial images were obtained from the base of the skull through the vertex with intravenous contrast. CONTRAST:  1mL OMNIPAQUE IOHEXOL 300 MG/ML  SOLN COMPARISON:  Noncontrast head CT earlier same day. FINDINGS: Brain: No abnormality affects the brainstem or cerebellum. 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 Volume loss of the right anterior temporal lobe. No acute parenchymal finding affects the right hemisphere. On the left, there is an intra-axial mass lesion at parietal vertex measuring 3.2 x 2.2 x 3.0 cm. Pronounced regional vasogenic edema. Metastatic disease would be the most common cause. Primary brain tumor is possible but less likely. No second intracranial lesion is identified. Vascular: Flow is present in the major vessels. Cannot evaluate the  region of the aneurysm well because artifact from the clip. Skull: Right pterional craniotomy/craniectomy as described above. Sinuses/Orbits: Clear/normal Other: None IMPRESSION: 1. 3.2 x 2.2 x 3.0 cm intra-axial mass lesion at the parietal vertex on the left with regional vasogenic edema. Metastatic disease would be the most common cause. Primary brain tumor is possible but less likely. 2. 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 Cannot evaluate the region of the aneurysm well because of artifact from the clip. Electronically Signed   By: Nelson Chimes M.D.   On: 08/23/2020 14:35   DG Chest Portable 1 View  Result Date: 08/23/2020 CLINICAL DATA:  Right arm numbness, leg numbness. EXAM: PORTABLE CHEST 1 VIEW COMPARISON:  None. FINDINGS: Chronic changes in the upper lobes with scarring and emphysema. Heart is normal size. No acute confluent opacities or effusions. No acute bony abnormality. IMPRESSION: COPD/chronic changes.  No active disease. Electronically Signed   By: Rolm Baptise M.D.   On: 08/23/2020 00:53      IMPRESSION: 41. 65 y.o. man with brain metastasis from non-small cell lung cancer*** The patient would benefit from surgical resection of the brain metastasis.* In addition, the patient would potentially benefit from radiotherapy.* The options include whole brain irradiation versus stereotactic radiosurgery. There are pros and cons associated with each of these potential treatment options. Whole brain radiotherapy would treat the known metastatic deposits and help provide some reduction of risk for future brain metastases. However, whole brain radiotherapy carries potential risks including hair loss, subacute somnolence, and neurocognitive changes including a possible reduction in short-term memory. Whole brain radiotherapy also may carry a lower likelihood of tumor control at the treatment sites because of the low-dose used.  Stereotactic radiosurgery carries a higher likelihood for local tumor control at the targeted sites with lower associated risk for neurocognitive changes such as memory loss.* However, the use of stereotactic radiosurgery in this setting may leave the patient at increased risk for  new brain metastases elsewhere in the brain as high as 50-60%. Accordingly, patients who receive stereotactic radiosurgery in this setting should undergo ongoing surveillance imaging with brain MRI more frequently in order to identify and treat new small brain metastases before they become symptomatic. Stereotactic radiosurgery does carry some different risks, including a risk of radionecrosis.  PLAN: Today, I reviewed the findings and workup thus far with the patient. We discussed the dilemma regarding whole brain radiotherapy versus stereotactic radiosurgery. We discussed the pros and cons of each. We also discussed the logistics and delivery of each. We reviewed the results associated with each of the treatments described above. The patient seems to understand the treatment options and would like to proceed with stereotactic radiosurgery.  In terms of timing of the stereotactic radiosurgery, evidence suggests that risk of radionecrosis and leptomeningeal recurrence is lower when used in the pre-operative setting as opposed to post-operative SRS.*  The patient will proceed with CT simulation at 11 am today.   In a visit lasting 70 minutes, greater than 50% of that time was spent in floor time, discussing his case and coordinating his care.   Nicholos Johns, PA-C    Tyler Pita, MD  [facility] Direct Dial: [phone]  Fax: [phone] [email or web address]  Skype  [email or web address]   This document serves as a record of services personally performed by Tyler Pita, MD and Freeman Caldron, PA-C. It was created on their behalf by Wilburn Mylar, a trained medical scribe. The creation of this  record is based on the scribe's personal observations and the provider's statements to them. This document has been checked and approved by the attending provider.     *References:  1: Patchell RA, Tibbs PA, Helene Shoe, [address] RJ, [place], Guy Sandifer JS, [place] B. A randomized trial of surgery in the treatment of single metastases to the brain. [place] Feb 22;322(8):494-500. PubMed PMID: [id number].   2: Patchell RA, Tibbs PA, Regine WF, Jonita Albee, Mohiuddin M, Arrie [place], [place], [place], [place] B. Postoperative radiotherapy in the treatment of single metastases to the brain: a randomized trial. JAMA. 1998 Nov 4;280(17):1485-9. PubMed PMID: 3329518.   3: Erlene Senters, Wenda Low, Hess KR, Tomie China, Lang FF, Kornguth DG, Malcolm, Swint JM, Shiu AS, Maor MH, [place]. Neurocognition in patients with brain metastases treated with radiosurgery or radiosurgery plus whole-brain irradiation: a randomised controlled trial. Lancet Oncol. 2009 Nov;10(11):1037-44. doi: 10.1016/S1470-2045(09)70263-3. Epub 2009 Oct 2. PubMed PMID: 84166063.  4: Weyman Rodney, Estill Dooms, Coralee Pesa, Crocker IR, Lorie Phenix, Charlesetta Garibaldi, Press RH, Tanya Nones, [place], Wait SD, Higinio Plan, Shu HG, [place]. Comparing Preoperative With Postoperative Stereotactic Radiosurgery for Resectable Brain Metastases: A Multi-institutional Analysis. Neurosurgery. 2015 Nov 2. [Epub ahead of print] PubMed PMID: [id number].

## 2020-08-24 NOTE — Progress Notes (Signed)
Physical Therapy Treatment Patient Details Name: [name] [name] MRN: [id number] DOB: [birth year] Today's Date: 08/24/2020    History of Present Illness Pt is a 65 y.o. male who presented 4/6 with R-sided weakness and numbness. Noncontrast head CT concerning for new 3.1 cm mass lesion involving the left posterior parietal convexity with large area of surrounding vasogenic edema and associated mass-effect. This is consistent with metastatic disease. PMH: HTN, anemia, bullous emphysema, COPD, and non-small cell lung cancer status post chemoradiation and now on immunotherapy.    PT Comments    Focused session on progressing gait distance and safety, with pt ambulating up to ~135 ft with a RW and minA with a chair follow this date. However, pt demonstrates R hand dysmetria, overshooting the RW handle initially upon standing, needing hand-over-hand guidance to place it correctly. In addition, he demonstrates R foot scissoring intermittently with tendency to drift to the R and bump into obstacles on the R. Pt with poor awareness into these deficits, needing cues repeatedly to attend to them, problem-solve his body position, and correct. Pt very motivated to improve and attempts to problem-solve his deficits and corrections, but needs cues to identify them initially. Pt with reported double vision, which improved when his R eye was covered. Will continue to follow acutely. Current recommendations remain appropriate.    Follow Up Recommendations  CIR;Supervision for mobility/OOB     Equipment Recommendations  Rolling walker with 5" wheels;3in1 (PT)    Recommendations for Other Services Rehab consult     Precautions / Restrictions Precautions Precautions: Fall Restrictions Weight Bearing Restrictions: No    Mobility  Bed Mobility Overal bed mobility: Modified Independent             General bed mobility comments: Pt able to perform all bed mobility with HOB elevated and use of rails.     Transfers Overall transfer level: Needs assistance Equipment used: Rolling walker (2 wheeled) Transfers: Sit to/from Stand Sit to Stand: Min assist         General transfer comment: Sit to stand from EOB > RW with minA for steadying and directing pt's R hand to the RW, with pt overshooting several times before hand-over-hand needed to place correctly.  Ambulation/Gait Ambulation/Gait assistance: Min assist;+2 safety/equipment Gait Distance (Feet): 135 Feet Assistive device: Rolling walker (2 wheeled);None Gait Pattern/deviations: Decreased step length - right;Decreased stride length;Decreased dorsiflexion - right;Trunk flexed;Step-through pattern;Drifts right/left;Scissoring Gait velocity: reduced Gait velocity interpretation: <1.31 ft/sec, indicative of household ambulator General Gait Details: Pt ambulating initially pushing R side of RW more distal than L, needing cues to keep bil sides proximal to body and remain within RW as he tends to step his R foot laterally outside RW when turning to R. Pt with decreased R step length and foot clearance with intermittent R foot scisooring, needing verbal and tactile cues to correct with minA. Pt tends to drift to the R and bump into obstacles on the R, cuing pt for visually scanning. Chair follow for safety.   Stairs             Wheelchair Mobility    Modified Rankin (Stroke Patients Only) Modified Rankin (Stroke Patients Only) Pre-Morbid Rankin Score: No symptoms Modified Rankin: Moderately severe disability     Balance Overall balance assessment: Needs assistance Sitting-balance support: No upper extremity supported;Feet supported Sitting balance-Leahy Scale: Good Sitting balance - Comments: Static sitting no LOB, supervision for safety.   Standing balance support: Bilateral upper extremity supported;During functional activity Standing balance-Leahy Scale: Poor  Standing balance comment: Bil UE support on RW with minA.                             Cognition Arousal/Alertness: Awake/alert Behavior During Therapy: WFL for tasks assessed/performed Overall Cognitive Status: Impaired/Different from baseline Area of Impairment: Safety/judgement;Following commands;Awareness;Problem solving                       Following Commands: Follows multi-step commands with increased time;Follows one step commands consistently;Follows one step commands with increased time Safety/Judgement: Decreased awareness of safety;Decreased awareness of deficits Awareness: Emergent Problem Solving: Slow processing;Requires verbal cues General Comments: Pt needing cues to attend to his deficits and make him spatially aware of his body position and surroundings. Pt attempts to correct himself, but needs cues to acknowledge it initially.      Exercises      General Comments General comments (skin integrity, edema, etc.): Double vision improves when R eye is covered, notified OT      Pertinent Vitals/Pain Pain Assessment: Faces Faces Pain Scale: No hurt Pain Intervention(s): Monitored during session    Home Living                      Prior Function            PT Goals (current goals can now be found in the care plan section) Acute Rehab PT Goals Patient Stated Goal: to improve PT Goal Formulation: With patient Time For Goal Achievement: 09/06/20 Potential to Achieve Goals: Fair Progress towards PT goals: Progressing toward goals    Frequency    Min 4X/week      PT Plan Current plan remains appropriate    Co-evaluation              AM-PAC PT "6 Clicks" Mobility   Outcome Measure  Help needed turning from your back to your side while in a flat bed without using bedrails?: None Help needed moving from lying on your back to sitting on the side of a flat bed without using bedrails?: None Help needed moving to and from a bed to a chair (including a wheelchair)?: A Little Help  needed standing up from a chair using your arms (e.g., wheelchair or bedside chair)?: A Little Help needed to walk in hospital room?: A Little Help needed climbing 3-5 steps with a railing? : A Lot 6 Click Score: 19    End of Session Equipment Utilized During Treatment: Gait belt Activity Tolerance: Patient tolerated treatment well Patient left: in chair;with call bell/phone within reach;with chair alarm set Nurse Communication: Mobility status PT Visit Diagnosis: Unsteadiness on feet (R26.81);Other abnormalities of gait and mobility (R26.89);Muscle weakness (generalized) (M62.81);Difficulty in walking, not elsewhere classified (R26.2);Other symptoms and signs involving the nervous system (R29.898);Hemiplegia and hemiparesis Hemiplegia - Right/Left: Right Hemiplegia - dominant/non-dominant: Non-dominant Hemiplegia - caused by:  (tumor)     Time: 2585-2778 PT Time Calculation (min) (ACUTE ONLY): 20 min  Charges:  $Gait Training: 8-22 mins                     .Moishe Spice, PT, DPT Acute Rehabilitation Services  Pager: [phone] Office: [place] 08/24/2020, 9:30 AM

## 2020-08-24 NOTE — Progress Notes (Signed)
Brief oncology note:  I met with Mr. Ausburn after he just returned from a visit with radiation oncology.  I discussed the CT scan findings with the patient which show a brain mass with vasogenic edema.  I reviewed the plan already outlined by radiation oncology and neurosurgery.  Plan is for preop SRS followed by resection.  I answered his questions today.  Continue dexamethasone per neurosurgery and radiation oncology.  The patient currently has an outpatient follow-up with medical oncology on 4/14 and for now we will keep this appointment as scheduled.  Systemic treatment will be placed on hold until after surgery.  Mikey Bussing, DNP, AGPCNP-BC, AOCNP

## 2020-08-24 NOTE — Progress Notes (Signed)
PM&R consult requested chart reviewed.  Currently awaiting plan for stereotactic left frontal parietal craniotomy next week.  Please reconsult after procedure as needed.

## 2020-08-24 NOTE — Progress Notes (Signed)
PROGRESS NOTE Brief same day note  [name] [name]  BSJ:[id number] DOB: [birth year] DOA: 08/22/2020 PCP: [name], NP   Chief Complain: Right arm/right leg numbness/weakness  Brief Narrative: Patient is a 65 year old male with history of COPD, non-small cell lung cancer status post chemoradiation, now on immunotherapy, active smoker who presented to the emergency department with further evaluation of right-sided weakness.  On presentation, he was hemodynamically stable.  Noncontrast head CT showed new 3.1 centimeters mass lesion involving the left posterior parietal convexity with large area of surrounding vasogenic edema and associated mass-effect.  CT with contrast confirmed 3.2 x 2.2 x 3 cm intra-axial mass lesion consistent with metastasis neurology consulted.  Started on Keppra, Decadron. Oncology,neurooncology and neurosurgery consulted,planning for  stereotactic radiosurgery followed by surgical resection.  PT recommending inpatient rehab on discharge.   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  CT with contrast confirmed 3.2 x 2.2 x 3 cm intra-axial mass lesion consistent with metastasis neurology consulted.  Started on Keppra, Decadron. Oncology,neurooncology and neurosurgery consulted,planning for  stereotactic radiosurgery followed by surgical resection. Continue Decadron, started on Keppra 500 mg twice daily.. Currently alert and oriented but intermittently confused.  Monitor mental status. He has weakness on the right side  Non-small cell lung cancer: Follows with Dr. Julien Nordmann, status post chemoradiation, now on chemotherapy with Imfinzi.  COPD: Currently stable, not in exacerbation.  Continue bronchodilators.  On room air.  Right-sided weakness: PT  recommended CIR on discharge.         DVT prophylaxis:Lovenox Code Status: Full Family Communication: Called friend on phone on 08/24/20,call not received Status is: Observation   Dispo: The patient is from: Home              Anticipated d/c is to: CIR              Patient currently is not medically stable to d/c.   Difficult to place patient No     Consultants: Neurology  Procedures:None  Antimicrobials:  Anti-infectives (From admission, onward)   None      Subjective:  Patient seen and examined the bedside this morning predominantly stable.  Sitting in the chair.  He was alert on mostly oriented but has some cognitive deficits.  Complains of weakness on the right side  Objective: Vitals:   08/23/20 2000 08/23/20 2315 08/24/20 0334 08/24/20 0735  BP:  114/69 105/68 (!) 100/54  Pulse:  90 90 99  Resp:  18 18 17   Temp:  (!) 97.3 F (36.3 C) (!) 97.4 F (36.3 C) 98.6 F (37 C)  TempSrc:  Oral Oral Oral  SpO2: 98% 99% 100% 95%  Weight:      Height:        Intake/Output Summary (Last 24 hours) at 08/24/2020 0740 Last data filed at 08/24/2020 0736 Gross per 24 hour  Intake 2080 ml  Output 700 ml  Net 1380 ml   Filed Weights   08/23/20 0552  Weight: 56.3 kg    Examination:  General exam: Overall comfortable, not in distress HEENT: PERRL Respiratory system:  no wheezes or crackles  Cardiovascular system: S1 & S2 heard, RRR.  Gastrointestinal system: Abdomen is nondistended, soft and nontender. Central nervous system: Alert and oriented,Weakness on the right side with motor of 4/ 5 on  right upper and right lower extremity, 5/5 on left upper and left lower extremity.  No facial droop Extremities: No edema, no clubbing ,no cyanosis Skin: No rashes, no ulcers,no icterus   Data Reviewed: I have personally reviewed following labs and imaging studies  CBC: Recent Labs  Lab 08/22/20 1411 08/23/20 0311 08/24/20 0024  WBC 3.8* 5.6 5.4  NEUTROABS 1.9  --    --   HGB 13.5 13.5 12.4*  HCT 41.3 42.0 38.2*  MCV 80.7 80.5 78.4*  PLT 276 304 951   Basic Metabolic Panel: Recent Labs  Lab 08/22/20 1411 08/23/20 0311 08/24/20 0024  NA 134* 135 135  K 3.8 4.2 4.1  CL 104 102 103  CO2 22 24 21*  GLUCOSE 74 79 160*  BUN 5* 8 8  CREATININE 0.68 0.78 1.02  CALCIUM 8.7* 9.3 9.3   GFR: Estimated Creatinine Clearance: 54.8 mL/min (by C-G formula based on SCr of 1.02 mg/dL). Liver Function Tests: Recent Labs  Lab 08/22/20 1411  AST 22  ALT 13  ALKPHOS 50  BILITOT 0.4  PROT 7.1  ALBUMIN 3.9   No results for input(s): LIPASE, AMYLASE in the last 168 hours. No results for input(s): AMMONIA in the last 168 hours. Coagulation Profile: No results for input(s): INR, PROTIME in the last 168 hours. Cardiac Enzymes: No results for input(s): CKTOTAL, CKMB, CKMBINDEX, TROPONINI in the last 168 hours. BNP (last 3 results) No results for input(s): PROBNP in the last 8760 hours. HbA1C: No results for input(s): HGBA1C in the last 72 hours. CBG: No results for input(s): GLUCAP in the last 168 hours. Lipid Profile: No results for input(s): CHOL, HDL, LDLCALC, TRIG, CHOLHDL, LDLDIRECT in the last 72 hours. Thyroid Function Tests: No results for input(s): TSH, T4TOTAL, FREET4, T3FREE, THYROIDAB in the last 72 hours. Anemia Panel: No results for input(s): VITAMINB12, FOLATE, FERRITIN, TIBC, IRON, RETICCTPCT in the last 72 hours. Sepsis Labs: No results for input(s): PROCALCITON, LATICACIDVEN in the last 168 hours.  Recent Results (from the past 240 hour(s))  Resp Panel by RT-PCR (Flu A&B, Covid) Nasopharyngeal Swab     Status: None   Collection Time: 08/23/20 12:10 AM   Specimen: Nasopharyngeal Swab; Nasopharyngeal(NP) swabs in vial transport medium  Result Value Ref Range Status   SARS Coronavirus 2 by RT PCR NEGATIVE NEGATIVE Final    Comment: (NOTE) SARS-CoV-2 target nucleic acids are NOT DETECTED.  The SARS-CoV-2 RNA is generally detectable  in upper respiratory specimens during the acute phase of infection. The lowest concentration of SARS-CoV-2 viral copies this assay can detect is 138 copies/mL. A negative result does not preclude SARS-Cov-2 infection and should not be used as the sole basis for treatment or other patient management decisions. A negative result may occur with  improper specimen collection/handling, submission of specimen other than nasopharyngeal swab, presence of viral mutation(s) within the areas targeted by this assay, and inadequate number of viral copies(<138 copies/mL). A negative result must be combined with clinical observations, patient history, and epidemiological information. The expected result is Negative.  Fact Sheet for Patients:  EntrepreneurPulse.com.au  Fact Sheet for Healthcare Providers:  IncredibleEmployment.be  This test is no t yet approved or cleared by the [place] FDA and  has been authorized for detection and/or diagnosis of SARS-CoV-2 by FDA under an Emergency Use Authorization (EUA). This EUA will remain  in effect (meaning this test can be used) for the duration of the COVID-19 declaration under Section 564(b)(1) of the Act, 21 U.S.C.section 360bbb-3(b)(1),  unless the authorization is terminated  or revoked sooner.       Influenza A by PCR NEGATIVE NEGATIVE Final   Influenza B by PCR NEGATIVE NEGATIVE Final    Comment: (NOTE) The Xpert Xpress SARS-CoV-2/FLU/RSV plus assay is intended as an aid in the diagnosis of influenza from Nasopharyngeal swab specimens and should not be used as a sole basis for treatment. Nasal washings and aspirates are unacceptable for Xpert Xpress SARS-CoV-2/FLU/RSV testing.  Fact Sheet for Patients: EntrepreneurPulse.com.au  Fact Sheet for Healthcare Providers: IncredibleEmployment.be  This test is not yet approved or cleared by the [place] FDA and has  been authorized for detection and/or diagnosis of SARS-CoV-2 by FDA under an Emergency Use Authorization (EUA). This EUA will remain in effect (meaning this test can be used) for the duration of the COVID-19 declaration under Section 564(b)(1) of the Act, 21 U.S.C. section 360bbb-3(b)(1), unless the authorization is terminated or revoked.  Performed at [facility], [place] [address] [place], [place] [address]   Surgical PCR screen     Status: None   Collection Time: 08/23/20  4:28 AM   Specimen: Nasal Mucosa; Nasal Swab  Result Value Ref Range Status   MRSA, PCR NEGATIVE NEGATIVE Final   Staphylococcus aureus NEGATIVE NEGATIVE Final    Comment: (NOTE) The Xpert SA Assay (FDA approved for NASAL specimens in patients 10 years of age and older), is one component of a comprehensive surveillance program. It is not intended to diagnose infection nor to guide or monitor treatment. Performed at [facility], [place] [address] [place], [place] [address]          Radiology Studies: CT Head Wo Contrast  Result Date: 08/23/2020 CLINICAL DATA:  Acute neurological deficit.  Stroke suspected. EXAM: CT HEAD WITHOUT CONTRAST TECHNIQUE: Contiguous axial images were obtained from the base of the skull through the vertex without intravenous contrast. COMPARISON:  12/28/2019 FINDINGS: Brain: New development since previous study of a mass lesion in the left posterior parietal convexity. The mass measures 2.4 x 2.4 x 3.1 cm in diameter. There is a large area of surrounding vasogenic edema throughout the left parietal lobe. There is associated mass effect with effacement of the left lateral ventricles and of the left parietal temporal sulci. No significant midline shift. Underlying cerebral atrophy. No ventricular dilatation. No acute intracranial hemorrhage. 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 Skull: Postoperative changes with right frontal and temporal craniotomy. No acute  depressed skull fractures. Sinuses/Orbits: Paranasal sinuses are clear. Bilateral mastoid effusions. Other: None. IMPRESSION: 1. New development of a 3.1 cm mass lesion in the left posterior parietal convexity with a large area of surrounding vasogenic edema and associated mass effect. This is consistent with metastatic disease. 2. No acute intracranial hemorrhage. 3. Postoperative changes with right frontal and temporal craniotomy. 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 4. Bilateral mastoid effusions. Electronically Signed   By: Lucienne Capers M.D.   On: 08/23/2020 01:18   CT HEAD W CONTRAST  Result Date: 08/23/2020 CLINICAL DATA:  Acute neurological deficit. Intracranial mass lesion. Non-small cell lung cancer. EXAM: CT HEAD WITH CONTRAST TECHNIQUE: Contiguous axial images were obtained from the base of the skull through the vertex with intravenous contrast. CONTRAST:  57mL OMNIPAQUE IOHEXOL 300 MG/ML  SOLN COMPARISON:  Noncontrast head CT earlier same day. FINDINGS: Brain: No abnormality affects the brainstem or cerebellum. 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 Volume loss of the right anterior temporal lobe. No acute parenchymal finding affects the right hemisphere. On the left, there is an intra-axial mass lesion at parietal vertex measuring 3.2 x 2.2 x 3.0 cm. Pronounced regional vasogenic edema. Metastatic disease would be the most common cause. Primary brain tumor is possible but less likely. No second intracranial lesion is identified. Vascular: Flow is present in the major vessels. Cannot evaluate the region of the aneurysm well because artifact from the clip. Skull: Right pterional craniotomy/craniectomy as described above. Sinuses/Orbits: Clear/normal Other: None IMPRESSION: 1. 3.2 x 2.2 x 3.0 cm intra-axial mass lesion at the parietal vertex on the left  with regional vasogenic edema. Metastatic disease would be the most common cause. Primary brain tumor is possible but less likely. 2. 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 Cannot evaluate the region of the aneurysm well because of artifact from the clip. Electronically Signed   By: Nelson Chimes M.D.   On: 08/23/2020 14:35   DG Chest Portable 1 View  Result Date: 08/23/2020 CLINICAL DATA:  Right arm numbness, leg numbness. EXAM: PORTABLE CHEST 1 VIEW COMPARISON:  None. FINDINGS: Chronic changes in the upper lobes with scarring and emphysema. Heart is normal size. No acute confluent opacities or effusions. No acute bony abnormality. IMPRESSION: COPD/chronic changes.  No active disease. Electronically Signed   By: Rolm Baptise M.D.   On: 08/23/2020 00:53        Scheduled Meds: . dexamethasone  4 mg Oral Q6H  . enoxaparin (LOVENOX) injection  40 mg Subcutaneous Daily  . levETIRAcetam  500 mg Oral BID  . mometasone-formoterol  2 puff Inhalation BID  . mupirocin ointment  1 application Nasal BID  . umeclidinium bromide  1 puff Inhalation Daily   Continuous Infusions:    LOS: 0 days    Time spent: 25 mins.More than 50% of that time was spent in counseling and/or coordination of care.      [name], MD [facility] P4/12/2020, 7:40 AM

## 2020-08-24 NOTE — Plan of Care (Signed)

## 2020-08-25 LAB — BASIC METABOLIC PANEL
Anion gap: 6 (ref 5–15)
BUN: 16 mg/dL (ref 8–23)
CO2: 25 mmol/L (ref 22–32)
Calcium: 9.3 mg/dL (ref 8.9–10.3)
Chloride: 107 mmol/L (ref 98–111)
Creatinine, Ser: 0.99 mg/dL (ref 0.61–1.24)
GFR, Estimated: >60 mL/min (ref >60)
Glucose, Bld: 127 mg/dL — ABNORMAL HIGH (ref 70–99)
Potassium: 4.2 mmol/L (ref 3.5–5.1)
Sodium: 138 mmol/L (ref 135–145)

## 2020-08-25 LAB — CBC
HCT: 37.4 % — ABNORMAL LOW (ref 39.0–52.0)
Hemoglobin: 12 g/dL — ABNORMAL LOW (ref 13.0–17.0)
MCH: 25.9 pg — ABNORMAL LOW (ref 26.0–34.0)
MCHC: 32.1 g/dL (ref 30.0–36.0)
MCV: 80.8 fL (ref 80.0–100.0)
Platelets: 293 10*3/uL (ref 150–400)
RBC: 4.63 MIL/uL (ref 4.22–5.81)
RDW: 15.9 % — ABNORMAL HIGH (ref 11.5–15.5)
WBC: 8.2 10*3/uL (ref 4.0–10.5)
nRBC: 0 % (ref 0.0–0.2)

## 2020-08-25 MED ORDER — PANTOPRAZOLE SODIUM 40 MG PO TBEC
40.0000 mg | DELAYED_RELEASE_TABLET | Freq: Every day | ORAL | Status: DC
  Administered 2020-08-25 – 2020-08-29 (×5): 40 mg via ORAL
  Filled 2020-08-25 (×5): qty 1

## 2020-08-25 NOTE — Progress Notes (Signed)
Occupational Therapy Evaluation Patient Details Name: [name] [name] MRN: [id number] DOB: 1955-05-25 Today's Date: 08/25/2020    History of Present Illness Pt is a 65 y.o. male who presented 4/6 with R-sided weakness and numbness. Noncontrast head CT concerning for new 3.1 cm mass lesion involving the left posterior parietal convexity with large area of surrounding vasogenic edema and associated mass-effect. This is consistent with metastatic disease. PMH: HTN, anemia, bullous emphysema, COPD, and non-small cell lung cancer status post chemoradiation and now on immunotherapy.   Clinical Impression   PTA pt lives independently alone in an apt, manages his own IADL tasks and takes public transportation. Pt presetns with significant change in functional status, requiring min A for mobility and ADL @ RW level due to deficits listed below. Per neurosurgery, Pt to have resection of lesion. Pt will require post acute rehab. Will continue to assess after surgery. OT will follow acutely to maximize functional level of independence.     Follow Up Recommendations  CIR (pending progress)    Equipment Recommendations  3 in 1 bedside commode    Recommendations for Other Services Rehab consult     Precautions / Restrictions Precautions Precautions: Fall      Mobility Bed Mobility Overal bed mobility: Modified Independent                  Transfers Overall transfer level: Needs assistance   Transfers: Sit to/from Stand Sit to Stand: Min assist         General transfer comment: steady assist; risk for falls    Balance Overall balance assessment: Needs assistance   Sitting balance-Leahy Scale: Good       Standing balance-Leahy Scale: Poor Standing balance comment: Bil UE support on RW with minA.                           ADL either performed or assessed with clinical judgement   ADL Overall ADL's : Needs assistance/impaired Eating/Feeding: Set  up Eating/Feeding Details (indicate cue type and reason): difficulty cutting food; drops utensils; difficuulty maintaining grasp on cups Grooming: Set up;Supervision/safety   Upper Body Bathing: Set up;Supervision/ safety;Sitting   Lower Body Bathing: Minimal assistance;Sit to/from stand   Upper Body Dressing : Set up;Supervision/safety;Sitting   Lower Body Dressing: Minimal assistance;Sit to/from stand   Toilet Transfer: RW;Ambulation;Minimal assistance   Toileting- Water quality scientist and Hygiene: Min guard;Sit to/from stand       Functional mobility during ADLs: Minimal assistance;Rolling walker;Cueing for safety;Cueing for sequencing       Vision Patient Visual Report: Blurring of vision Vision Assessment?: Vision impaired- to be further tested in functional context Additional Comments: poor tracking and saccadic eye movements; sill further assess; apparently had complained of diplopia at [facility] however no complaints of diplopia at this time     Perception Perception Comments: decreased awreness at times in space. will further assess   Praxis Praxis Praxis tested?: Deficits Deficits: Limb apraxia    Pertinent Vitals/Pain Pain Assessment: No/denies pain     Hand Dominance Left   Extremity/Trunk Assessment Upper Extremity Assessment Upper Extremity Assessment: RUE deficits/detail RUE Deficits / Details: @ 4/5 throughout. attmepts to use functionally however apparent snsory motor deficits impacting fine and gross motor ability. Poor in-hand manipulation skills. RUE Sensation: decreased light touch;decreased proprioception RUE Coordination: decreased fine motor;decreased gross motor   Lower Extremity Assessment Lower Extremity Assessment: Defer to PT evaluation   Cervical / Trunk Assessment Cervical / Trunk Assessment:  Normal   Communication Communication Communication: No difficulties   Cognition Arousal/Alertness: Awake/alert Behavior During Therapy: WFL for  tasks assessed/performed Overall Cognitive Status: Impaired/Different from baseline Area of Impairment: Attention;Safety/judgement;Awareness;Problem solving                   Current Attention Level: Selective   Following Commands: Follows one step commands consistently Safety/Judgement: Decreased awareness of safety;Decreased awareness of deficits Awareness: Emergent Problem Solving: Slow processing;Difficulty sequencing;Requires verbal cues     General Comments       Exercises Exercises: Other exercises Other Exercises Other Exercises: cup stacking; BUE integrated tasks Other Exercises: in-hand manipulation tasks   Shoulder Instructions      Home Living Family/patient expects to be discharged to:: Private residence Living Arrangements: Alone Available Help at Discharge: Neighbor;Available PRN/intermittently;Friend(s) Type of Home: Apartment Home Access: Level entry     Home Layout: One level     Bathroom Shower/Tub: Teacher, early years/pre: Standard Bathroom Accessibility: Yes How Accessible: Accessible via walker Home Equipment: Grab bars - tub/shower          Prior Functioning/Environment Level of Independence: Independent        Comments: Pt takes public transportation or walks to stores, he does not drive. Pt independent with all ADLs and mobility without AD/AE. No falls in past 6 months prior to onset of symptoms. Enjoys being outside.        OT Problem List: Decreased strength;Decreased range of motion;Decreased activity tolerance;Impaired balance (sitting and/or standing);Impaired vision/perception;Decreased coordination;Decreased cognition;Decreased safety awareness;Decreased knowledge of use of DME or AE;Impaired sensation;Impaired UE functional use      OT Treatment/Interventions: Self-care/ADL training;Therapeutic exercise;Neuromuscular education;DME and/or AE instruction;Energy conservation;Therapeutic activities;Cognitive  remediation/compensation;Visual/perceptual remediation/compensation;Patient/family education;Balance training    OT Goals(Current goals can be found in the care plan section) Acute Rehab OT Goals Patient Stated Goal: to get better OT Goal Formulation: With patient Time For Goal Achievement: 09/08/20 Potential to Achieve Goals: Good  OT Frequency: Min 2X/week   Barriers to D/C:            Co-evaluation              AM-PAC OT "6 Clicks" Daily Activity     Outcome Measure Help from another person eating meals?: A Little Help from another person taking care of personal grooming?: A Little Help from another person toileting, which includes using toliet, bedpan, or urinal?: A Little Help from another person bathing (including washing, rinsing, drying)?: A Little Help from another person to put on and taking off regular upper body clothing?: A Little Help from another person to put on and taking off regular lower body clothing?: A Little 6 Click Score: 18   End of Session Equipment Utilized During Treatment: Gait belt;Rolling walker Nurse Communication: Mobility status  Activity Tolerance: Patient tolerated treatment well Patient left: in chair;with call bell/phone within reach;with chair alarm set  OT Visit Diagnosis: Unsteadiness on feet (R26.81);Other abnormalities of gait and mobility (R26.89);Muscle weakness (generalized) (M62.81);Apraxia (R48.2);Other symptoms and signs involving cognitive function;Hemiplegia and hemiparesis Hemiplegia - Right/Left: Right Hemiplegia - dominant/non-dominant: Non-Dominant Hemiplegia - caused by: Unspecified                Time: 1123-1150 OT Time Calculation (min): 27 min Charges:  OT General Charges $OT Visit: 1 Visit OT Evaluation $OT Eval Moderate Complexity: 1 Mod OT Treatments $Self Care/Home Management : 8-22 mins  Maurie Boettcher, OT/L   Acute OT Clinical Specialist Acute Rehabilitation Services Pager [phone] Office  [place] 08/25/2020, 3:49 PM

## 2020-08-25 NOTE — Progress Notes (Signed)
PROGRESS NOTE Brief same day note  [name] [name]  EHM:[id number] DOB: May 20, 1955 DOA: 08/22/2020 PCP: [name], NP   Chief Complain: Right arm/right leg numbness/weakness  Brief Narrative: Patient is a 65 year old male with history of COPD, non-small cell lung cancer status post chemoradiation, now on immunotherapy, active smoker who presented to the emergency department with further evaluation of right-sided weakness.  On presentation, he was hemodynamically stable.  Noncontrast head CT showed new 3.1 centimeters mass lesion involving the left posterior parietal convexity with large area of surrounding vasogenic edema and associated mass-effect.  CT with contrast confirmed 3.2 x 2.2 x 3 cm intra-axial mass lesion consistent with metastasis neurology consulted.  Started on Keppra, Decadron. Oncology,neurooncology and neurosurgery consulted,planning for  stereotactic radiosurgery followed by surgical resection/cranitomy next week.  PT recommending inpatient rehab on discharge.   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  CT with contrast confirmed 3.2 x 2.2 x 3 cm intra-axial mass lesion consistent with metastasis.  Started on Keppra, Decadron. Oncology,neurooncology and neurosurgery consulted,planning for  stereotactic radiosurgery followed by surgical resection. Continue Decadron, started on Keppra 500 mg twice daily.. Currently alert and oriented but was confused on presentation.  Monitor mental status. He has weakness on the right side  Non-small cell lung cancer: Follows with Dr. Julien Nordmann, status post chemoradiation, now on chemotherapy with Imfinzi.  COPD: Currently stable, not in exacerbation.  Continue bronchodilators.  On room air.  Right-sided  weakness: PT recommended CIR on discharge.         DVT prophylaxis:Lovenox Code Status: Full Family Communication: Called friend on phone on 08/24/20,call not received Status is: Observation   Dispo: The patient is from: Home              Anticipated d/c is to: CIR              Patient currently is not medically stable to d/c.   Difficult to place patient No     Consultants: Neurology  Procedures:None  Antimicrobials:  Anti-infectives (From admission, onward)   None      Subjective:  Patient seen and examined at bedside this morning.  Hemodynamically stable.  Comfortable.  Alert and oriented.  Sitting on the chair.  Still has some weakness on the right side.  Denies any new complaints  Objective: Vitals:   08/24/20 1900 08/24/20 2037 08/24/20 2326 08/25/20 0334  BP: 101/61  106/66 126/67  Pulse: (!) 102  74 77  Resp: 19  20 20   Temp: 97.6 F (36.4 C)  98.2 F (36.8 C) 98.2 F (36.8 C)  TempSrc: Oral  Oral Oral  SpO2: 98% 98% 97% 95%  Weight:      Height:        Intake/Output Summary (Last 24 hours) at 08/25/2020 0730 Last data filed at 08/25/2020 0334 Gross per 24 hour  Intake 670 ml  Output 450 ml  Net 220 ml   Filed Weights   08/23/20 0552  Weight: 56.3 kg    Examination:  General exam: Overall comfortable, not in distress HEENT: PERRL Respiratory system:  no wheezes or crackles  Cardiovascular system: S1 & S2 heard, RRR.  Gastrointestinal system: Abdomen is nondistended, soft and nontender. Central nervous system: Alert and oriented, mild weakness on the right side with  motor of 4/5 on right upper and lower extremities Extremities: No edema, no clubbing ,no cyanosis Skin: No rashes, no ulcers,no icterus   Data Reviewed: I have personally reviewed following labs and imaging studies  CBC: Recent Labs  Lab 08/22/20 1411 08/23/20 0311 08/24/20 0024 08/25/20 0223  WBC 3.8* 5.6 5.4 8.2  NEUTROABS 1.9  --   --   --   HGB 13.5 13.5 12.4* 12.0*   HCT 41.3 42.0 38.2* 37.4*  MCV 80.7 80.5 78.4* 80.8  PLT 276 304 273 941   Basic Metabolic Panel: Recent Labs  Lab 08/22/20 1411 08/23/20 0311 08/24/20 0024 08/25/20 0223  NA 134* 135 135 138  K 3.8 4.2 4.1 4.2  CL 104 102 103 107  CO2 22 24 21* 25  GLUCOSE 74 79 160* 127*  BUN 5* 8 8 16   CREATININE 0.68 0.78 1.02 0.99  CALCIUM 8.7* 9.3 9.3 9.3   GFR: Estimated Creatinine Clearance: 56.5 mL/min (by C-G formula based on SCr of 0.99 mg/dL). Liver Function Tests: Recent Labs  Lab 08/22/20 1411  AST 22  ALT 13  ALKPHOS 50  BILITOT 0.4  PROT 7.1  ALBUMIN 3.9   No results for input(s): LIPASE, AMYLASE in the last 168 hours. No results for input(s): AMMONIA in the last 168 hours. Coagulation Profile: No results for input(s): INR, PROTIME in the last 168 hours. Cardiac Enzymes: No results for input(s): CKTOTAL, CKMB, CKMBINDEX, TROPONINI in the last 168 hours. BNP (last 3 results) No results for input(s): PROBNP in the last 8760 hours. HbA1C: No results for input(s): HGBA1C in the last 72 hours. CBG: No results for input(s): GLUCAP in the last 168 hours. Lipid Profile: No results for input(s): CHOL, HDL, LDLCALC, TRIG, CHOLHDL, LDLDIRECT in the last 72 hours. Thyroid Function Tests: No results for input(s): TSH, T4TOTAL, FREET4, T3FREE, THYROIDAB in the last 72 hours. Anemia Panel: No results for input(s): VITAMINB12, FOLATE, FERRITIN, TIBC, IRON, RETICCTPCT in the last 72 hours. Sepsis Labs: No results for input(s): PROCALCITON, LATICACIDVEN in the last 168 hours.  Recent Results (from the past 240 hour(s))  Resp Panel by RT-PCR (Flu A&B, Covid) Nasopharyngeal Swab     Status: None   Collection Time: 08/23/20 12:10 AM   Specimen: Nasopharyngeal Swab; Nasopharyngeal(NP) swabs in vial transport medium  Result Value Ref Range Status   SARS Coronavirus 2 by RT PCR NEGATIVE NEGATIVE Final    Comment: (NOTE) SARS-CoV-2 target nucleic acids are NOT DETECTED.  The  SARS-CoV-2 RNA is generally detectable in upper respiratory specimens during the acute phase of infection. The lowest concentration of SARS-CoV-2 viral copies this assay can detect is 138 copies/mL. A negative result does not preclude SARS-Cov-2 infection and should not be used as the sole basis for treatment or other patient management decisions. A negative result may occur with  improper specimen collection/handling, submission of specimen other than nasopharyngeal swab, presence of viral mutation(s) within the areas targeted by this assay, and inadequate number of viral copies(<138 copies/mL). A negative result must be combined with clinical observations, patient history, and epidemiological information. The expected result is Negative.  Fact Sheet for Patients:  EntrepreneurPulse.com.au  Fact Sheet for Healthcare Providers:  IncredibleEmployment.be  This test is no t yet approved or cleared by the [place] FDA and  has been authorized for detection and/or diagnosis of SARS-CoV-2 by FDA under an Emergency Use Authorization (EUA). This EUA will remain  in effect (meaning this test can be used) for the duration of the  COVID-19 declaration under Section 564(b)(1) of the Act, 21 U.S.C.section 360bbb-3(b)(1), unless the authorization is terminated  or revoked sooner.       Influenza A by PCR NEGATIVE NEGATIVE Final   Influenza B by PCR NEGATIVE NEGATIVE Final    Comment: (NOTE) The Xpert Xpress SARS-CoV-2/FLU/RSV plus assay is intended as an aid in the diagnosis of influenza from Nasopharyngeal swab specimens and should not be used as a sole basis for treatment. Nasal washings and aspirates are unacceptable for Xpert Xpress SARS-CoV-2/FLU/RSV testing.  Fact Sheet for Patients: EntrepreneurPulse.com.au  Fact Sheet for Healthcare Providers: IncredibleEmployment.be  This test is not yet approved or  cleared by the [place] FDA and has been authorized for detection and/or diagnosis of SARS-CoV-2 by FDA under an Emergency Use Authorization (EUA). This EUA will remain in effect (meaning this test can be used) for the duration of the COVID-19 declaration under Section 564(b)(1) of the Act, 21 U.S.C. section 360bbb-3(b)(1), unless the authorization is terminated or revoked.  Performed at [facility], [place] [address], [place], [place] [address]   Surgical PCR screen     Status: None   Collection Time: 08/23/20  4:28 AM   Specimen: Nasal Mucosa; Nasal Swab  Result Value Ref Range Status   MRSA, PCR NEGATIVE NEGATIVE Final   Staphylococcus aureus NEGATIVE NEGATIVE Final    Comment: (NOTE) The Xpert SA Assay (FDA approved for NASAL specimens in patients 17 years of age and older), is one component of a comprehensive surveillance program. It is not intended to diagnose infection nor to guide or monitor treatment. Performed at [facility], [place] [address] [place], [place] [address]          Radiology Studies: CT HEAD W CONTRAST  Result Date: 08/23/2020 CLINICAL DATA:  Acute neurological deficit. Intracranial mass lesion. Non-small cell lung cancer. EXAM: CT HEAD WITH CONTRAST TECHNIQUE: Contiguous axial images were obtained from the base of the skull through the vertex with intravenous contrast. CONTRAST:  26mL OMNIPAQUE IOHEXOL 300 MG/ML  SOLN COMPARISON:  Noncontrast head CT earlier same day. FINDINGS: Brain: No abnormality affects the brainstem or cerebellum. 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 Volume loss of the right anterior temporal lobe. No acute parenchymal finding affects the right hemisphere. On the left, there is an intra-axial mass lesion at parietal vertex measuring 3.2 x 2.2 x 3.0 cm. Pronounced regional vasogenic  edema. Metastatic disease would be the most common cause. Primary brain tumor is possible but less likely. No second intracranial lesion is identified. Vascular: Flow is present in the major vessels. Cannot evaluate the region of the aneurysm well because artifact from the clip. Skull: Right pterional craniotomy/craniectomy as described above. Sinuses/Orbits: Clear/normal Other: None IMPRESSION: 1. 3.2 x 2.2 x 3.0 cm intra-axial mass lesion at the parietal vertex on the left with regional vasogenic edema. Metastatic disease would be the most common cause. Primary brain tumor is possible but less likely. 2. 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 Cannot evaluate the region of the aneurysm well because of artifact from the clip. Electronically Signed   By: Nelson Chimes M.D.   On: 08/23/2020 14:35        Scheduled Meds: . dexamethasone  4 mg Oral Q6H  . enoxaparin (LOVENOX) injection  40 mg Subcutaneous  Daily  . levETIRAcetam  500 mg Oral BID  . mometasone-formoterol  2 puff Inhalation BID  . mupirocin ointment  1 application Nasal BID  . umeclidinium bromide  1 puff Inhalation Daily   Continuous Infusions:    LOS: 1 day    Time spent: 25 mins.More than 50% of that time was spent in counseling and/or coordination of care.      [name], MD [facility] P4/01/2021, 7:30 AM

## 2020-08-25 NOTE — Progress Notes (Signed)
Subjective: Patient reports no significant neurologic change  Objective: Vital signs in last 24 hours: Temp:  [97.6 F (36.4 C)-98.2 F (36.8 C)] 98.2 F (36.8 C) (04/09 0735) Pulse Rate:  [74-102] 82 (04/09 0735) Resp:  [16-20] 18 (04/09 0735) BP: (101-126)/(61-79) 107/66 (04/09 0735) SpO2:  [95 %-100 %] 97 % (04/09 0825)  Intake/Output from previous day: 04/08 0701 - 04/09 0700 In: 670 [P.O.:670] Out: 450 [Urine:450] Intake/Output this shift: Total I/O In: 240 [P.O.:240] Out: 225 [Urine:225]  NAD A+Ox3 Sitting in chair Right pronator drift, clumsiness  Lab Results: Recent Labs    08/24/20 0024 08/25/20 0223  WBC 5.4 8.2  HGB 12.4* 12.0*  HCT 38.2* 37.4*  PLT 273 293   BMET Recent Labs    08/24/20 0024 08/25/20 0223  NA 135 138  K 4.1 4.2  CL 103 107  CO2 21* 25  GLUCOSE 160* 127*  BUN 8 16  CREATININE 1.02 0.99  CALCIUM 9.3 9.3    Studies/Results: CT HEAD W CONTRAST  Result Date: 08/23/2020 CLINICAL DATA:  Acute neurological deficit. Intracranial mass lesion. Non-small cell lung cancer. EXAM: CT HEAD WITH CONTRAST TECHNIQUE: Contiguous axial images were obtained from the base of the skull through the vertex with intravenous contrast. CONTRAST:  17mL OMNIPAQUE IOHEXOL 300 MG/ML  SOLN COMPARISON:  Noncontrast head CT earlier same day. FINDINGS: Brain: No abnormality affects the brainstem or cerebellum. 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 Volume loss of the right anterior temporal lobe. No acute parenchymal finding affects the right hemisphere. On the left, there is an intra-axial mass lesion at parietal vertex measuring 3.2 x 2.2 x 3.0 cm. Pronounced regional vasogenic edema. Metastatic disease would be the most common cause. Primary brain tumor is possible but less likely. No second intracranial lesion is identified.  Vascular: Flow is present in the major vessels. Cannot evaluate the region of the aneurysm well because artifact from the clip. Skull: Right pterional craniotomy/craniectomy as described above. Sinuses/Orbits: Clear/normal Other: None IMPRESSION: 1. 3.2 x 2.2 x 3.0 cm intra-axial mass lesion at the parietal vertex on the left with regional vasogenic edema. Metastatic disease would be the most common cause. Primary brain tumor is possible but less likely. 2. 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 Cannot evaluate the region of the aneurysm well because of artifact from the clip. Electronically Signed   By: Nelson Chimes M.D.   On: 08/23/2020 14:35    Assessment/Plan: 65 yo M with NSCLC and large left parietal/somatosensory met - preop SRS and resection next week - cont dex- start protonix for GI ppx   [name] [name] 08/25/2020, 11:34 AM

## 2020-08-26 DIAGNOSIS — C7931 Secondary malignant neoplasm of brain: Secondary | ICD-10-CM

## 2020-08-26 NOTE — Progress Notes (Signed)
PROGRESS NOTE Brief same day note  [name] [name]  QIH:[id number] DOB: [birth year] DOA: 08/22/2020 PCP: [name], NP   Chief Complain: Right arm/right leg numbness/weakness  Brief Narrative: Patient is a 65 year old male with history of COPD, non-small cell lung cancer status post chemoradiation, now on immunotherapy, active smoker who presented to the emergency department with further evaluation of right-sided weakness.  On presentation, he was hemodynamically stable.  Noncontrast head CT showed new 3.1 centimeters mass lesion involving the left posterior parietal convexity with large area of surrounding vasogenic edema and associated mass-effect.  CT with contrast confirmed 3.2 x 2.2 x 3 cm intra-axial mass lesion consistent with metastasis neurology consulted.  Started on Keppra, Decadron. Oncology,neurooncology and neurosurgery consulted,planning for  stereotactic radiosurgery followed by surgical resection/cranitomy early next week.  PT recommending inpatient rehab on discharge.   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  CT with contrast confirmed 3.2 x 2.2 x 3 cm intra-axial mass lesion consistent with metastasis.  Started on Keppra, Decadron. Oncology,neurooncology and neurosurgery consulted,planning for  stereotactic radiosurgery followed by surgical resection. Continue Decadron, started on Keppra 500 mg twice daily.. Currently alert and oriented but was confused on presentation.  Monitor mental status. He has weakness on the right side  Non-small cell lung cancer: Follows with Dr. Julien Nordmann, status post chemoradiation, now on chemotherapy with Imfinzi.  COPD: Currently stable, not in exacerbation.  Continue bronchodilators.  On room  air.  Right-sided weakness: PT recommended CIR on discharge.         DVT prophylaxis:Lovenox Code Status: Full Family Communication: Called friend on phone on 08/24/20,call not received Status is: Observation   Dispo: The patient is from: Home              Anticipated d/c is to: CIR              Patient currently is not medically stable to d/c.   Difficult to place patient No     Consultants: Neurology  Procedures:None  Antimicrobials:  Anti-infectives (From admission, onward)   None      Subjective: Patient seen and examined at the bedside this morning.  Stable.  No new complaints.  Objective: Vitals:   08/25/20 1945 08/25/20 2036 08/26/20 0000 08/26/20 0335  BP: (!) 97/59  117/65 126/73  Pulse: 98  66 78  Resp: 17  14 16   Temp: 98.2 F (36.8 C)  98.1 F (36.7 C) 98.2 F (36.8 C)  TempSrc: Oral  Oral Oral  SpO2: 96% 95% 97% 98%  Weight:      Height:        Intake/Output Summary (Last 24 hours) at 08/26/2020 0751 Last data filed at 08/26/2020 0344 Gross per 24 hour  Intake 120 ml  Output 225 ml  Net -105 ml   Filed Weights   08/23/20 0552  Weight: 56.3 kg    Examination:  General exam: Overall comfortable, not in distress HEENT: PERRL Respiratory system:  no wheezes or crackles  Cardiovascular system: S1 & S2 heard, RRR.  Gastrointestinal system: Abdomen is nondistended, soft and nontender. Central nervous system: Alert and oriented, mild right-sided weakness Extremities: No edema, no clubbing ,no cyanosis Skin: No rashes, no ulcers,no icterus    Data Reviewed: I have personally reviewed following labs and  imaging studies  CBC: Recent Labs  Lab 08/22/20 1411 08/23/20 0311 08/24/20 0024 08/25/20 0223  WBC 3.8* 5.6 5.4 8.2  NEUTROABS 1.9  --   --   --   HGB 13.5 13.5 12.4* 12.0*  HCT 41.3 42.0 38.2* 37.4*  MCV 80.7 80.5 78.4* 80.8  PLT 276 304 273 161   Basic Metabolic Panel: Recent Labs  Lab 08/22/20 1411 08/23/20 0311  08/24/20 0024 08/25/20 0223  NA 134* 135 135 138  K 3.8 4.2 4.1 4.2  CL 104 102 103 107  CO2 22 24 21* 25  GLUCOSE 74 79 160* 127*  BUN 5* 8 8 16   CREATININE 0.68 0.78 1.02 0.99  CALCIUM 8.7* 9.3 9.3 9.3   GFR: Estimated Creatinine Clearance: 56.5 mL/min (by C-G formula based on SCr of 0.99 mg/dL). Liver Function Tests: Recent Labs  Lab 08/22/20 1411  AST 22  ALT 13  ALKPHOS 50  BILITOT 0.4  PROT 7.1  ALBUMIN 3.9   No results for input(s): LIPASE, AMYLASE in the last 168 hours. No results for input(s): AMMONIA in the last 168 hours. Coagulation Profile: No results for input(s): INR, PROTIME in the last 168 hours. Cardiac Enzymes: No results for input(s): CKTOTAL, CKMB, CKMBINDEX, TROPONINI in the last 168 hours. BNP (last 3 results) No results for input(s): PROBNP in the last 8760 hours. HbA1C: No results for input(s): HGBA1C in the last 72 hours. CBG: No results for input(s): GLUCAP in the last 168 hours. Lipid Profile: No results for input(s): CHOL, HDL, LDLCALC, TRIG, CHOLHDL, LDLDIRECT in the last 72 hours. Thyroid Function Tests: No results for input(s): TSH, T4TOTAL, FREET4, T3FREE, THYROIDAB in the last 72 hours. Anemia Panel: No results for input(s): VITAMINB12, FOLATE, FERRITIN, TIBC, IRON, RETICCTPCT in the last 72 hours. Sepsis Labs: No results for input(s): PROCALCITON, LATICACIDVEN in the last 168 hours.  Recent Results (from the past 240 hour(s))  Resp Panel by RT-PCR (Flu A&B, Covid) Nasopharyngeal Swab     Status: None   Collection Time: 08/23/20 12:10 AM   Specimen: Nasopharyngeal Swab; Nasopharyngeal(NP) swabs in vial transport medium  Result Value Ref Range Status   SARS Coronavirus 2 by RT PCR NEGATIVE NEGATIVE Final    Comment: (NOTE) SARS-CoV-2 target nucleic acids are NOT DETECTED.  The SARS-CoV-2 RNA is generally detectable in upper respiratory specimens during the acute phase of infection. The lowest concentration of SARS-CoV-2 viral  copies this assay can detect is 138 copies/mL. A negative result does not preclude SARS-Cov-2 infection and should not be used as the sole basis for treatment or other patient management decisions. A negative result may occur with  improper specimen collection/handling, submission of specimen other than nasopharyngeal swab, presence of viral mutation(s) within the areas targeted by this assay, and inadequate number of viral copies(<138 copies/mL). A negative result must be combined with clinical observations, patient history, and epidemiological information. The expected result is Negative.  Fact Sheet for Patients:  EntrepreneurPulse.com.au  Fact Sheet for Healthcare Providers:  IncredibleEmployment.be  This test is no t yet approved or cleared by the [place] FDA and  has been authorized for detection and/or diagnosis of SARS-CoV-2 by FDA under an Emergency Use Authorization (EUA). This EUA will remain  in effect (meaning this test can be used) for the duration of the COVID-19 declaration under Section 564(b)(1) of the Act, 21 U.S.C.section 360bbb-3(b)(1), unless the authorization is terminated  or revoked sooner.       Influenza A by PCR NEGATIVE NEGATIVE Final  Influenza B by PCR NEGATIVE NEGATIVE Final    Comment: (NOTE) The Xpert Xpress SARS-CoV-2/FLU/RSV plus assay is intended as an aid in the diagnosis of influenza from Nasopharyngeal swab specimens and should not be used as a sole basis for treatment. Nasal washings and aspirates are unacceptable for Xpert Xpress SARS-CoV-2/FLU/RSV testing.  Fact Sheet for Patients: EntrepreneurPulse.com.au  Fact Sheet for Healthcare Providers: IncredibleEmployment.be  This test is not yet approved or cleared by the [place] FDA and has been authorized for detection and/or diagnosis of SARS-CoV-2 by FDA under an Emergency Use Authorization (EUA). This  EUA will remain in effect (meaning this test can be used) for the duration of the COVID-19 declaration under Section 564(b)(1) of the Act, 21 U.S.C. section 360bbb-3(b)(1), unless the authorization is terminated or revoked.  Performed at [facility], [place] [address], [place], [place] [address]   Surgical PCR screen     Status: None   Collection Time: 08/23/20  4:28 AM   Specimen: Nasal Mucosa; Nasal Swab  Result Value Ref Range Status   MRSA, PCR NEGATIVE NEGATIVE Final   Staphylococcus aureus NEGATIVE NEGATIVE Final    Comment: (NOTE) The Xpert SA Assay (FDA approved for NASAL specimens in patients 65 years of age and older), is one component of a comprehensive surveillance program. It is not intended to diagnose infection nor to guide or monitor treatment. Performed at [facility], [place] [address] [place], [place] [address]          Radiology Studies: No results found.      Scheduled Meds: . dexamethasone  4 mg Oral Q6H  . enoxaparin (LOVENOX) injection  40 mg Subcutaneous Daily  . levETIRAcetam  500 mg Oral BID  . mometasone-formoterol  2 puff Inhalation BID  . mupirocin ointment  1 application Nasal BID  . pantoprazole  40 mg Oral Daily  . umeclidinium bromide  1 puff Inhalation Daily   Continuous Infusions:    LOS: 2 days    Time spent: 25 mins.More than 50% of that time was spent in counseling and/or coordination of care.      [name], MD [facility] P4/02/2021, 7:51 AM

## 2020-08-26 NOTE — Progress Notes (Signed)
Subjective: Patient reports no new symptoms  Objective: Vital signs in last 24 hours: Temp:  [97.6 F (36.4 C)-98.7 F (37.1 C)] 98.7 F (37.1 C) (04/10 1100) Pulse Rate:  [66-98] 92 (04/10 1100) Resp:  [14-17] 14 (04/10 1100) BP: (97-134)/(59-86) 104/70 (04/10 1100) SpO2:  [95 %-98 %] 96 % (04/10 1100)  Intake/Output from previous day: 04/09 0701 - 04/10 0700 In: 360 [P.O.:360] Out: 450 [Urine:450] Intake/Output this shift: Total I/O In: 240 [P.O.:240] Out: 550 [Urine:550]  NAD FC x 4 +pronator drift  Lab Results: Recent Labs    08/24/20 0024 08/25/20 0223  WBC 5.4 8.2  HGB 12.4* 12.0*  HCT 38.2* 37.4*  PLT 273 293   BMET Recent Labs    08/24/20 0024 08/25/20 0223  NA 135 138  K 4.1 4.2  CL 103 107  CO2 21* 25  GLUCOSE 160* 127*  BUN 8 16  CREATININE 1.02 0.99  CALCIUM 9.3 9.3    Studies/Results: No results found.  Assessment/Plan: Brain metastasis - preop SRS and surgery this week   [name] [name] 08/26/2020, 11:24 AM

## 2020-08-27 NOTE — Plan of Care (Signed)
Patient is using the urinal frequently without assistance.

## 2020-08-27 NOTE — Progress Notes (Signed)
  NEUROSURGERY PROGRESS NOTE   No issues overnight. Pt without new complaint, still has subjective RUE/RLE weakness/clumsiness.  EXAM:  BP 105/70 (BP Location: Right Arm)   Pulse 91   Temp 98 F (36.7 C) (Oral)   Resp 17   Ht _0  (1.549 m)   Wt 56.3 kg   SpO2 97%   BMI 23.45 kg/m   Awake, alert, oriented  Speech fluent, appropriate  CN grossly intact  5/5 LUE/LLE Diffusely 4-4+/5 RUE/RLE   IMPRESSION:  65 y.o. male with left parietal non-small cell lung CA met and associated right hemiparesis. Will need surgical resection with preop SRS, planned this Thurs 4/14  PLAN: - Cont supportive care   Consuella Lose, MD [facility] \

## 2020-08-27 NOTE — Progress Notes (Addendum)
Inpatient Rehabilitation Admissions Coordinator  I met at bedside with patient to clarify caregiver support and his plans postoperatively. He directed me to call [name], his church member who he relies on to assist with his care. I contacted Vickii Chafe and she states she is his liaison. Patient lives alone, sister in [place] and brother in [place]. She is hopeful that patient can obtain assistance to designate a [place] prior to his surgery. I have notified acute team, and TOC, Kristi, of her request. I will follow up postoperatively.  Danne Baxter, RN, MSN Rehab Admissions Coordinator [phone] 08/27/2020 2:12 PM

## 2020-08-27 NOTE — Progress Notes (Signed)
Radiation Oncology         [phone] ________________________________  Initial Inpatient Consultation  Name: [name] [name] MRN: [id number]  Date of Service: 08/23/2020 DOB: [birth year]  [id number], Milford Cage, NP  [name], MD   REFERRING PHYSICIAN: [name], MD  DIAGNOSIS: The encounter diagnosis was Brain metastasis [name]).    ICD-10-CM   1. Brain metastasis ([place])  C79.31     HISTORY OF PRESENT ILLNESS: [name] [name] is a 65 y.o. male seen at the request of Dr. Tawanna Solo. He has been under the care of Dr. 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 He was treated with concurrent chemoradiation, with daily IMRT under Dr. Lisbeth Renshaw and weekly carboplatin/paclitaxel (7 cycles). He is now on consolidation treatment with immunotherapy consisting of Imfinzi every 4 weeks, started 04/09/20. His most recent chest CT on 07/02/20 showed mixed response to therapy.  More recently, he contacted Dr. Worthy Flank office on 08/23/19 with concerns regarding right upper and lower extremity weakness. 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 Due to the aneurysm clip, he was unable to undergo MRI. They proceeded with contrast head CT showing: 3.2 cm intra-axial mass lesion at parietal vertex on left with regional vasogenic edema.  PREVIOUS RADIATION THERAPY: Yes  01/17/20 - 03/02/20: The patient was treated to the disease within the right lung initially to a dose of 60 Gy using a 3 field, IMRT technique. The patient then received a cone down boost treatment for an additional 6 Gy. This yielded a final total dose of 66 Gy.  PAST MEDICAL  HISTORY:  Past Medical History:  Diagnosis Date  . 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[place]) 12/25/2019  . COPD (chronic obstructive pulmonary disease) ([place]) 11/02/2007   Qualifier: Diagnosis of  By: Melvyn Novas MD, Christena Deem   . Hypertension 12/24/2019  . Iron deficiency anemia 08/23/2007   Qualifier: Diagnosis of  By: Jim Like        PAST SURGICAL HISTORY: Past Surgical History:  Procedure Laterality Date  . ENDOBRONCHIAL ULTRASOUND N/A 12/27/2019   Procedure: ENDOBRONCHIAL ULTRASOUND;  Surgeon: Laurin Coder, MD;  Location: [facility] ENDOSCOPY;  Service: Endoscopy;  Laterality: N/A;  . FINE NEEDLE ASPIRATION  12/27/2019   Procedure: FINE NEEDLE ASPIRATION;  Surgeon: Laurin Coder, MD;  Location: [facility] ENDOS[facility];  Service: Endoscopy;;  . VIDEO BRONCHOSCOPY N/A 12/27/2019   Procedure: VIDEO BRONCHOSCOPY WITHOUT FLUORO;  Surgeon: Laurin Coder, MD;  Location: [facility] ENDOS[facility];  Service: Endoscopy;  Laterality: N/A;    FAMILY HISTORY:  Family History  Problem Relation Age of Onset  . Hypertension Other     SOCIAL HISTORY:  Social History   Socioeconomic History  . Marital status: Single    Spouse name: Not on file  . Number of children: Not on file  . Years of education: Not on file  . Highest education level: Not on file  Occupational History  . Not on file  Tobacco Use  . Smoking status: Former Smoker    Packs/day: 0.50    Types: Cigarettes  . Smokeless tobacco: Never Used  . Tobacco comment: Pt states he stopped since he was discharged from the hospital  Vaping Use  . Vaping Use: Never used  Substance and Sexual Activity  . Alcohol use: Not Currently  . Drug use: Never  . Sexual activity: Not Currently  Other Topics Concern  . Not on file  Social History Narrative  . Not on file   Social Determinants of Health   Financial Resource Strain: High Risk  . Difficulty of Paying Living Expenses: Hard  Food Insecurity: No Food Insecurity  . Worried About Charity fundraiser in the  Last Year: Never true  . Ran Out of Food in the Last Year: Never true  Transportation Needs: Unmet Transportation Needs  . Lack of Transportation (Medical): Yes  . Lack of Transportation (Non-Medical): Yes  Physical Activity: Not on file  Stress: Not on file  Social Connections: Not on file  Intimate Partner Violence: Not on file    ALLERGIES: Other  MEDICATIONS:  No current facility-administered medications for this encounter.   No current outpatient medications on file.   Facility-Administered Medications Ordered in Other Encounters  Medication Dose Route Frequency Provider Last Rate Last Admin  . acetaminophen (TYLENOL) tablet 650 mg  650 mg Oral Q6H PRN Opyd, Ilene Qua, MD       Or  . acetaminophen (TYLENOL) suppository 650 mg  650 mg Rectal Q6H PRN Opyd, Ilene Qua, MD      . albuterol (VENTOLIN HFA) 108 (90 Base) MCG/ACT inhaler 2 puff  2 puff Inhalation Q4H PRN Opyd, Ilene Qua, MD      . dexamethasone (DECADRON) tablet 4 mg  4 mg Oral Q6H Opyd, Ilene Qua, MD   4 mg at 08/27/20 0833  . enoxaparin (LOVENOX) injection 40 mg  40 mg Subcutaneous Daily Opyd, Ilene Qua, MD   40 mg at 08/27/20 0833  . HYDROcodone-acetaminophen (NORCO/VICODIN) 5-325 MG per tablet 1 tablet  1 tablet Oral Q4H PRN Opyd, Ilene Qua, MD   1 tablet at 08/27/20 0833  . levETIRAcetam (KEPPRA) tablet 500 mg  500 mg Oral BID Vianne Bulls, MD   500 mg at 08/27/20 0833  . LORazepam (ATIVAN) injection 1 mg  1 mg Intravenous Once PRN Opyd, Ilene Qua, MD      . mometasone-formoterol (DULERA) 100-5 MCG/ACT inhaler 2 puff  2 puff Inhalation BID Opyd, Ilene Qua, MD   2 puff at 08/27/20 0940  . mupirocin ointment (BACTROBAN) 2 % 1 application  1 application Nasal BID Opyd, Ilene Qua, MD   1 application at 40/98/11 0952  . ondansetron (ZOFRAN) tablet 4 mg  4 mg Oral Q6H PRN Opyd, Ilene Qua, MD       Or  . ondansetron (ZOFRAN) injection 4 mg  4 mg Intravenous Q6H PRN Opyd, Ilene Qua, MD      . pantoprazole (PROTONIX) EC  tablet 40 mg  40 mg Oral Daily Vallarie Mare, MD   40 mg at 08/27/20 9147  . senna-docusate (Senokot-S) tablet 1 tablet  1 tablet Oral QHS PRN Opyd, Ilene Qua, MD      . umeclidinium bromide (INCRUSE ELLIPTA) 62.5 MCG/INH 1 puff  1 puff Inhalation Daily Opyd, Ilene Qua, MD   1 puff at 08/27/20 0942    REVIEW OF SYSTEMS:  On review of systems, the patient reports that he is doing well overall. He denies any chest pain, shortness of breath, cough, fevers, chills, night sweats, unintended weight changes. He denies any bowel or bladder disturbances, and denies abdominal pain, nausea or vomiting. A complete review of systems is obtained and is otherwise negative.    PHYSICAL EXAM:  Wt Readings from Last 3 Encounters:  08/23/20 124 lb 1.9 oz (56.3 kg)  07/30/20 124 lb 1.6 oz (56.3 kg)  07/05/20 125 lb 14.4 oz (57.1 kg)   Temp Readings from Last 3 Encounters:  08/27/20 98.2 F (36.8 C) (Oral)  07/30/20 (!) 96.4 F (35.8 C) (Tympanic)  07/05/20 97.8 F (36.6 C) (Tympanic)   BP Readings from Last 3 Encounters:  08/27/20 110/69  07/30/20 128/80  07/05/20 113/76   Pulse Readings from Last 3 Encounters:  08/27/20 86  07/30/20 94  07/05/20 95    /10  In general this is a well appearing man in no acute distress. He is alert and oriented.  Per neurosurgery: Awake, alert, oriented  Speech fluent, appropriate  CN grossly intact  4+/5 diffusely RUE/RLE (+) drift on right Extinction to LT on right  KPS = 50  100 - Normal; no complaints; no evidence of disease. 90   - Able to carry on normal activity; minor signs or symptoms of disease. 80   - Normal activity with effort; some signs or symptoms of disease. 64   - Cares for self; unable to carry on normal activity or to do active work. 60   - Requires occasional assistance, but is able to care for most of his personal needs. 50   - Requires considerable assistance and frequent medical care. 69   - Disabled; requires special care and  assistance. 82   - Severely disabled; hospital admission is indicated although death not imminent. 77   - Very sick; hospital admission necessary; active supportive treatment necessary. 10   - Moribund; fatal processes progressing rapidly. 0     - Dead  Karnofsky DA, [place], Craver LS and Burchenal JH [phone]) The use of the nitrogen mustards in the palliative treatment of carcinoma: with particular reference to bronchogenic carcinoma Cancer 1 634-56  LABORATORY DATA:  Lab Results  Component Value Date   WBC 8.2 08/25/2020   HGB 12.0 (L) 08/25/2020   HCT 37.4 (L) 08/25/2020   MCV 80.8 08/25/2020   PLT 293 08/25/2020   Lab Results  Component Value Date   NA 138 08/25/2020   K 4.2 08/25/2020   CL 107 08/25/2020   CO2 25 08/25/2020   Lab Results  Component Value Date   ALT 13 08/22/2020   AST 22 08/22/2020   ALKPHOS 50 08/22/2020   BILITOT 0.4 08/22/2020     RADIOGRAPHY: CT Head Wo Contrast  Result Date: 08/23/2020 CLINICAL DATA:  Acute neurological deficit.  Stroke suspected. EXAM: CT HEAD WITHOUT CONTRAST TECHNIQUE: Contiguous axial images were obtained from the base of the skull through the vertex without intravenous contrast. COMPARISON:  12/28/2019 FINDINGS: Brain: New development since previous study of a mass lesion in the left posterior parietal convexity. The mass measures 2.4 x 2.4 x 3.1 cm in diameter. There is a large area of surrounding vasogenic edema throughout the left parietal lobe. There is associated mass effect with effacement of the left lateral ventricles and of the left parietal temporal sulci. No significant midline shift. Underlying cerebral atrophy. No ventricular dilatation. No acute intracranial hemorrhage. 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 Skull: Postoperative changes with right frontal and temporal craniotomy. No acute depressed skull fractures. Sinuses/Orbits: Paranasal sinuses are clear. Bilateral mastoid effusions. Other: None.  IMPRESSION: 1. New development of a 3.1 cm mass lesion in the left posterior parietal convexity with a large area of surrounding vasogenic edema and associated mass effect. This is consistent  with metastatic disease. 2. No acute intracranial hemorrhage. 3. Postoperative changes with right frontal and temporal craniotomy. 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 4. Bilateral mastoid effusions. Electronically Signed   By: Lucienne Capers M.D.   On: 08/23/2020 01:18   CT HEAD W CONTRAST  Result Date: 08/23/2020 CLINICAL DATA:  Acute neurological deficit. Intracranial mass lesion. Non-small cell lung cancer. EXAM: CT HEAD WITH CONTRAST TECHNIQUE: Contiguous axial images were obtained from the base of the skull through the vertex with intravenous contrast. CONTRAST:  74mL OMNIPAQUE IOHEXOL 300 MG/ML  SOLN COMPARISON:  Noncontrast head CT earlier same day. FINDINGS: Brain: No abnormality affects the brainstem or cerebellum. 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 Volume loss of the right anterior temporal lobe. No acute parenchymal finding affects the right hemisphere. On the left, there is an intra-axial mass lesion at parietal vertex measuring 3.2 x 2.2 x 3.0 cm. Pronounced regional vasogenic edema. Metastatic disease would be the most common cause. Primary brain tumor is possible but less likely. No second intracranial lesion is identified. Vascular: Flow is present in the major vessels. Cannot evaluate the region of the aneurysm well because artifact from the clip. Skull: Right pterional craniotomy/craniectomy as described above. Sinuses/Orbits: Clear/normal Other: None IMPRESSION: 1. 3.2 x 2.2 x 3.0 cm intra-axial mass lesion at the parietal vertex on the left with regional vasogenic edema. Metastatic disease would be the most common cause. Primary brain tumor is possible  but less likely. 2. 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 Cannot evaluate the region of the aneurysm well because of artifact from the clip. Electronically Signed   By: Nelson Chimes M.D.   On: 08/23/2020 14:35   DG Chest Portable 1 View  Result Date: 08/23/2020 CLINICAL DATA:  Right arm numbness, leg numbness. EXAM: PORTABLE CHEST 1 VIEW COMPARISON:  None. FINDINGS: Chronic changes in the upper lobes with scarring and emphysema. Heart is normal size. No acute confluent opacities or effusions. No acute bony abnormality. IMPRESSION: COPD/chronic changes.  No active disease. Electronically Signed   By: Rolm Baptise M.D.   On: 08/23/2020 00:53      IMPRESSION: 1. 65 y.o. man with brain metastasis from non-small cell lung cancer.  The patient would benefit from surgical resection of the brain metastasis.* In addition, the patient would potentially benefit from radiotherapy.* The options include whole brain irradiation versus stereotactic radiosurgery. There are pros and cons associated with each of these potential treatment options. Whole brain radiotherapy would treat the known metastatic deposits and help provide some reduction of risk for future brain metastases. However, whole brain radiotherapy carries potential risks including hair loss, subacute somnolence, and neurocognitive changes including a possible reduction in short-term memory. Whole brain radiotherapy also may carry a lower likelihood of tumor control at the treatment sites because of the low-dose used. Stereotactic radiosurgery carries a higher likelihood for local tumor control at the targeted sites with lower associated risk for neurocognitive changes such as memory loss.* However, the use of stereotactic radiosurgery in this setting may leave the patient at increased risk for new brain metastases elsewhere in the brain as high as 50-60%. Accordingly, patients who receive  stereotactic radiosurgery in this setting should undergo ongoing surveillance imaging with brain MRI more frequently in order to identify and treat new small brain  metastases before they become symptomatic. Stereotactic radiosurgery does carry some different risks, including a risk of radionecrosis.  PLAN: Today, I reviewed the findings and workup thus far with the patient. We discussed the dilemma regarding whole brain radiotherapy versus stereotactic radiosurgery. We discussed the pros and cons of each. We also discussed the logistics and delivery of each. We reviewed the results associated with each of the treatments described above. The patient seems to understand the treatment options and would like to proceed with stereotactic radiosurgery.  In terms of timing of the stereotactic radiosurgery, evidence suggests that risk of radionecrosis and leptomeningeal recurrence is lower when used in the pre-operative setting as opposed to post-operative SRS.*  The patient will proceed with CT simulation at 11 am tomorrow.  In a visit lasting 70 minutes, greater than 50% of that time was spent in floor time, discussing his case and coordinating his care.   Nicholos Johns, PA-C    Tyler Pita, MD  [facility] Direct Dial: [phone]  Fax: [phone] [email or web address]  Skype  [email or web address]   This document serves as a record of services personally performed by Tyler Pita, MD and Freeman Caldron, PA-C. It was created on their behalf by Wilburn Mylar, a trained medical scribe. The creation of this record is based on the scribe's personal observations and the provider's statements to them. This document has been checked and approved by the attending provider.     *References:  1: Patchell RA, Tibbs PA, Helene Shoe, [address] RJ, [place], Guy Sandifer JS, [place] B. A randomized trial of surgery in the treatment of single metastases to the brain. [place] Feb 22;322(8):494-500. PubMed PMID: [id number].   2: Patchell RA, Tibbs PA, Regine WF, Jonita Albee, Mohiuddin M, Arrie Eastern, [place], [place], [place] B. Postoperative radiotherapy in the treatment of single metastases to the brain: a randomized trial. JAMA. 1998 Nov 4;280(17):1485-9. PubMed PMID: [id number].   3: Erlene Senters, Wenda Low, Hess KR, Tomie China, Lang FF, Kornguth DG, [place], Swint JM, Shiu AS, Maor MH, [place]. Neurocognition in patients with brain metastases treated with radiosurgery or radiosurgery plus whole-brain irradiation: a randomised controlled trial. Lancet Oncol. 2009 Nov;10(11):1037-44. doi: 10.1016/S1470-2045(09)70263-3. Epub 2009 Oct 2. PubMed PMID: 35329924.  4: Weyman Rodney, Estill Dooms, Coralee Pesa, Crocker IR, Lorie Phenix, Charlesetta Garibaldi, Press RH, Tanya Nones, [place], Wait SD, Higinio Plan, Shu HG, [place]. Comparing Preoperative With Postoperative Stereotactic Radiosurgery for Resectable Brain Metastases: A Multi-institutional Analysis. Neurosurgery. 2015 Nov 2. [Epub ahead of print] PubMed PMID: [id number].

## 2020-08-27 NOTE — Progress Notes (Signed)
PROGRESS NOTE Brief same day note  [name] [name]  WUJ:[id number] DOB: 02-21-1956 DOA: 08/22/2020 PCP: [name], NP   Chief Complain: Right arm/right leg numbness/weakness  Brief Narrative: Patient is a 65 year old male with history of COPD, non-small cell lung cancer status post chemoradiation, now on immunotherapy, active smoker who presented to the emergency department with further evaluation of right-sided weakness.  On presentation, he was hemodynamically stable.  Noncontrast head CT showed new 3.1 centimeters mass lesion involving the left posterior parietal convexity with large area of surrounding vasogenic edema and associated mass-effect.  CT with contrast confirmed 3.2 x 2.2 x 3 cm intra-axial mass lesion consistent with metastasis neurology consulted.  Started on Keppra, Decadron. Oncology,neurooncology and neurosurgery consulted,planning for  stereotactic radiosurgery followed by surgical resection/cranitomy ,palnned for 4/14  PT recommending inpatient rehab on discharge.   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  CT with contrast confirmed 3.2 x 2.2 x 3 cm intra-axial mass lesion consistent with metastasis.   Oncology,neurooncology and neurosurgery consulted,planning for  stereotactic radiosurgery followed by surgical resection on 4/14. Continue Decadron, also on Keppra 500 mg twice daily.. Currently alert and oriented but was confused on presentation.  Monitor mental status. He has weakness on the right side  Non-small cell lung cancer: Follows with Dr. Julien Nordmann, status post chemoradiation, now on chemotherapy with Imfinzi.  COPD: Currently stable, not in exacerbation.  Continue bronchodilators.  On room air.  Right-sided weakness: PT  recommended CIR on discharge.         DVT prophylaxis:Lovenox Code Status: Full Family Communication: Called friend on phone on 08/24/20,call not received Status is: Observation   Dispo: The patient is from: Home              Anticipated d/c is to: CIR              Patient currently is not medically stable to d/c.   Difficult to place patient No     Consultants: Neurology  Procedures:None  Antimicrobials:  Anti-infectives (From admission, onward)   None      Subjective: Patient seen and examined at the bedside this morning.  Comfortable.  Sitting in the chair.  Alert and oriented.  Denies any new complaints  Objective: Vitals:   08/26/20 1425 08/26/20 2000 08/26/20 2053 08/27/20 0420  BP: 111/69 121/76  131/77  Pulse: 86 82  99  Resp: 16 20  15   Temp: 98.1 F (36.7 C) 98.4 F (36.9 C)  97.9 F (36.6 C)  TempSrc: Oral Oral  Oral  SpO2: 97% 95% 97% 97%  Weight:      Height:        Intake/Output Summary (Last 24 hours) at 08/27/2020 0729 Last data filed at 08/27/2020 0400 Gross per 24 hour  Intake 960 ml  Output 2000 ml  Net -1040 ml   Filed Weights   08/23/20 0552  Weight: 56.3 kg    Examination:  General exam: Overall comfortable, not in distress HEENT: PERRL Respiratory system:  no wheezes or crackles  Cardiovascular system: S1 & S2 heard, RRR.  Gastrointestinal system: Abdomen is nondistended, soft and nontender. Central nervous system: Alert and oriented, mild weakness on right side with motor of 4/5 on right upper and lower extremities Extremities: No edema, no clubbing ,  no cyanosis Skin: No rashes, no ulcers,no icterus   Data Reviewed: I have personally reviewed following labs and imaging studies  CBC: Recent Labs  Lab 08/22/20 1411 08/23/20 0311 08/24/20 0024 08/25/20 0223  WBC 3.8* 5.6 5.4 8.2  NEUTROABS 1.9  --   --   --   HGB 13.5 13.5 12.4* 12.0*  HCT 41.3 42.0 38.2* 37.4*  MCV 80.7 80.5 78.4* 80.8  PLT 276 304 273 409    Basic Metabolic Panel: Recent Labs  Lab 08/22/20 1411 08/23/20 0311 08/24/20 0024 08/25/20 0223  NA 134* 135 135 138  K 3.8 4.2 4.1 4.2  CL 104 102 103 107  CO2 22 24 21* 25  GLUCOSE 74 79 160* 127*  BUN 5* 8 8 16   CREATININE 0.68 0.78 1.02 0.99  CALCIUM 8.7* 9.3 9.3 9.3   GFR: Estimated Creatinine Clearance: 56.5 mL/min (by C-G formula based on SCr of 0.99 mg/dL). Liver Function Tests: Recent Labs  Lab 08/22/20 1411  AST 22  ALT 13  ALKPHOS 50  BILITOT 0.4  PROT 7.1  ALBUMIN 3.9   No results for input(s): LIPASE, AMYLASE in the last 168 hours. No results for input(s): AMMONIA in the last 168 hours. Coagulation Profile: No results for input(s): INR, PROTIME in the last 168 hours. Cardiac Enzymes: No results for input(s): CKTOTAL, CKMB, CKMBINDEX, TROPONINI in the last 168 hours. BNP (last 3 results) No results for input(s): PROBNP in the last 8760 hours. HbA1C: No results for input(s): HGBA1C in the last 72 hours. CBG: No results for input(s): GLUCAP in the last 168 hours. Lipid Profile: No results for input(s): CHOL, HDL, LDLCALC, TRIG, CHOLHDL, LDLDIRECT in the last 72 hours. Thyroid Function Tests: No results for input(s): TSH, T4TOTAL, FREET4, T3FREE, THYROIDAB in the last 72 hours. Anemia Panel: No results for input(s): VITAMINB12, FOLATE, FERRITIN, TIBC, IRON, RETICCTPCT in the last 72 hours. Sepsis Labs: No results for input(s): PROCALCITON, LATICACIDVEN in the last 168 hours.  Recent Results (from the past 240 hour(s))  Resp Panel by RT-PCR (Flu A&B, Covid) Nasopharyngeal Swab     Status: None   Collection Time: 08/23/20 12:10 AM   Specimen: Nasopharyngeal Swab; Nasopharyngeal(NP) swabs in vial transport medium  Result Value Ref Range Status   SARS Coronavirus 2 by RT PCR NEGATIVE NEGATIVE Final    Comment: (NOTE) SARS-CoV-2 target nucleic acids are NOT DETECTED.  The SARS-CoV-2 RNA is generally detectable in upper respiratory specimens during  the acute phase of infection. The lowest concentration of SARS-CoV-2 viral copies this assay can detect is 138 copies/mL. A negative result does not preclude SARS-Cov-2 infection and should not be used as the sole basis for treatment or other patient management decisions. A negative result may occur with  improper specimen collection/handling, submission of specimen other than nasopharyngeal swab, presence of viral mutation(s) within the areas targeted by this assay, and inadequate number of viral copies(<138 copies/mL). A negative result must be combined with clinical observations, patient history, and epidemiological information. The expected result is Negative.  Fact Sheet for Patients:  EntrepreneurPulse.com.au  Fact Sheet for Healthcare Providers:  IncredibleEmployment.be  This test is no t yet approved or cleared by the [place] FDA and  has been authorized for detection and/or diagnosis of SARS-CoV-2 by FDA under an Emergency Use Authorization (EUA). This EUA will remain  in effect (meaning this test can be used) for the duration of the COVID-19 declaration under Section 564(b)(1) of the Act, 21 U.S.C.section 360bbb-3(b)(1), unless the authorization  is terminated  or revoked sooner.       Influenza A by PCR NEGATIVE NEGATIVE Final   Influenza B by PCR NEGATIVE NEGATIVE Final    Comment: (NOTE) The Xpert Xpress SARS-CoV-2/FLU/RSV plus assay is intended as an aid in the diagnosis of influenza from Nasopharyngeal swab specimens and should not be used as a sole basis for treatment. Nasal washings and aspirates are unacceptable for Xpert Xpress SARS-CoV-2/FLU/RSV testing.  Fact Sheet for Patients: EntrepreneurPulse.com.au  Fact Sheet for Healthcare Providers: IncredibleEmployment.be  This test is not yet approved or cleared by the [place] FDA and has been authorized for detection and/or  diagnosis of SARS-CoV-2 by FDA under an Emergency Use Authorization (EUA). This EUA will remain in effect (meaning this test can be used) for the duration of the COVID-19 declaration under Section 564(b)(1) of the Act, 21 U.S.C. section 360bbb-3(b)(1), unless the authorization is terminated or revoked.  Performed at [facility], [place] [address] [place], [place] [address]   Surgical PCR screen     Status: None   Collection Time: 08/23/20  4:28 AM   Specimen: Nasal Mucosa; Nasal Swab  Result Value Ref Range Status   MRSA, PCR NEGATIVE NEGATIVE Final   Staphylococcus aureus NEGATIVE NEGATIVE Final    Comment: (NOTE) The Xpert SA Assay (FDA approved for NASAL specimens in patients 35 years of age and older), is one component of a comprehensive surveillance program. It is not intended to diagnose infection nor to guide or monitor treatment. Performed at [facility], [place] [address] [place], [place] [address]          Radiology Studies: No results found.      Scheduled Meds: . dexamethasone  4 mg Oral Q6H  . enoxaparin (LOVENOX) injection  40 mg Subcutaneous Daily  . levETIRAcetam  500 mg Oral BID  . mometasone-formoterol  2 puff Inhalation BID  . mupirocin ointment  1 application Nasal BID  . pantoprazole  40 mg Oral Daily  . umeclidinium bromide  1 puff Inhalation Daily   Continuous Infusions:    LOS: 3 days    Time spent: 25 mins.More than 50% of that time was spent in counseling and/or coordination of care.      [name], MD [facility] P4/03/2021, 7:29 AM

## 2020-08-27 NOTE — Addendum Note (Signed)
Encounter addended by: Tyler Pita, MD on: 08/27/2020 1:19 PM  Actions taken: Clinical Note Signed

## 2020-08-27 NOTE — Progress Notes (Signed)
Physical Therapy Treatment Patient Details Name: [name] [name] MRN: [id number] DOB: [birth year] Today's Date: 08/27/2020    History of Present Illness Pt is a 65 y.o. male who presented 4/6 with R-sided weakness and numbness. Noncontrast head CT concerning for new 3.1 cm mass lesion involving the left posterior parietal convexity with large area of surrounding vasogenic edema and associated mass-effect. This is consistent with metastatic disease. PMH: HTN, anemia, bullous emphysema, COPD, and non-small cell lung cancer status post chemoradiation and now on immunotherapy.    PT Comments    The pt was seen for progression of OOB mobility, stability, and improved self-management of safety with mobility. The pt was able to demo great progress with ambulation, stability, and completed multiple LB strength and stability exercises this session. He continues to need minG and intermittent minA to manage stability and for spatial management of RW. The pt frequently drifts laterally or requires assist for management of RW to maintain safe positioning with mobility. Will continue to progress with current goals with plan to re-assess following planned surgery on Thursday 4/14.     Follow Up Recommendations  CIR;Supervision for mobility/OOB     Equipment Recommendations  Rolling walker with 5" wheels;3in1 (PT)    Recommendations for Other Services       Precautions / Restrictions Precautions Precautions: Fall Restrictions Weight Bearing Restrictions: No    Mobility  Bed Mobility Overal bed mobility: Modified Independent             General bed mobility comments: pt able to perform bed mobility without assist, increased time and effort    Transfers Overall transfer level: Needs assistance Equipment used: Rolling walker (2 wheeled);None Transfers: Sit to/from Stand Sit to Stand: Min guard;Min assist         General transfer comment: minG to power up from bed with UE support on RW  to steady in stance, pt able to perform stand-sit without use of RW with good stability  Ambulation/Gait Ambulation/Gait assistance: Min guard;Min assist Gait Distance (Feet): 100 Feet Assistive device: Rolling walker (2 wheeled);None Gait Pattern/deviations: Decreased step length - right;Decreased stride length;Decreased dorsiflexion - right;Trunk flexed;Step-through pattern;Drifts right/left Gait velocity: 0.5 m/s Gait velocity interpretation: 1.31 - 2.62 ft/sec, indicative of limited community ambulator General Gait Details: pt with poor spatial management of RW needing minA at times or minA to steady with movement of RW. The pt requries cues to maintain position in RW. no scissoring at this time, able to ambulate short distances in his room without RW      Modified Rankin (Stroke Patients Only) Modified Rankin (Stroke Patients Only) Pre-Morbid Rankin Score: No symptoms Modified Rankin: Moderately severe disability     Balance Overall balance assessment: Needs assistance Sitting-balance support: No upper extremity supported;Feet supported Sitting balance-Leahy Scale: Good Sitting balance - Comments: Static sitting no LOB, supervision for safety.   Standing balance support: Bilateral upper extremity supported;During functional activity Standing balance-Leahy Scale: Fair Standing balance comment: can ambulate short distances without UE support, prefers BUE support                            Cognition Arousal/Alertness: Awake/alert Behavior During Therapy: WFL for tasks assessed/performed Overall Cognitive Status: Impaired/Different from baseline Area of Impairment: Attention;Safety/judgement;Problem solving;Awareness                   Current Attention Level: Selective   Following Commands: Follows one step commands consistently;Follows multi-step commands inconsistently Safety/Judgement: Decreased  awareness of safety;Decreased awareness of  deficits Awareness: Emergent Problem Solving: Slow processing;Difficulty sequencing;Requires verbal cues General Comments: cues to attend to deficits, to manage spacing and control of his positioning.      Exercises Other Exercises Other Exercises: lateral stepping with BUE support on rail. x20 ft to each side. Other Exercises: standing calf raises against wall x10 Other Exercises: squat with BUE support on hallway rail x 10    General Comments General comments (skin integrity, edema, etc.): VSS, double vision persists      Pertinent Vitals/Pain Pain Assessment: No/denies pain Pain Intervention(s): Monitored during session           PT Goals (current goals can now be found in the care plan section) Acute Rehab PT Goals Patient Stated Goal: to get better PT Goal Formulation: With patient Time For Goal Achievement: 09/06/20 Potential to Achieve Goals: Fair Progress towards PT goals: Progressing toward goals    Frequency    Min 4X/week      PT Plan Current plan remains appropriate       AM-PAC PT "6 Clicks" Mobility   Outcome Measure  Help needed turning from your back to your side while in a flat bed without using bedrails?: None Help needed moving from lying on your back to sitting on the side of a flat bed without using bedrails?: None Help needed moving to and from a bed to a chair (including a wheelchair)?: A Little Help needed standing up from a chair using your arms (e.g., wheelchair or bedside chair)?: A Little Help needed to walk in hospital room?: A Little Help needed climbing 3-5 steps with a railing? : A Lot 6 Click Score: 19    End of Session Equipment Utilized During Treatment: Gait belt Activity Tolerance: Patient tolerated treatment well Patient left: with call bell/phone within reach;in bed;with bed alarm set Nurse Communication: Mobility status PT Visit Diagnosis: Unsteadiness on feet (R26.81);Other abnormalities of gait and mobility  (R26.89);Muscle weakness (generalized) (M62.81);Difficulty in walking, not elsewhere classified (R26.2);Other symptoms and signs involving the nervous system (R29.898);Hemiplegia and hemiparesis Hemiplegia - Right/Left: Right Hemiplegia - dominant/non-dominant: Non-dominant Hemiplegia - caused by:  (tumor)     Time: 3212-2482 PT Time Calculation (min) (ACUTE ONLY): 23 min  Charges:  $Gait Training: 8-22 mins $Neuromuscular Re-education: 8-22 mins                     Karma Ganja, PT, DPT   Acute Rehabilitation Department Pager #: [phone]   Otho Bellows 08/27/2020, 6:16 PM

## 2020-08-28 ENCOUNTER — Inpatient Hospital Stay (HOSPITAL_COMMUNITY): Payer: Medicaid Other

## 2020-08-28 ENCOUNTER — Ambulatory Visit (HOSPITAL_COMMUNITY): Payer: Medicaid Other

## 2020-08-28 ENCOUNTER — Telehealth: Payer: Self-pay | Admitting: Internal Medicine

## 2020-08-28 MED ORDER — IOHEXOL 300 MG/ML  SOLN
75.0000 mL | Freq: Once | INTRAMUSCULAR | Status: AC | PRN
  Administered 2020-08-28: 75 mL via INTRAVENOUS

## 2020-08-28 NOTE — Progress Notes (Signed)
  NEUROSURGERY PROGRESS NOTE   Plan for resection with pre op SRS on Thursday. Will need CT w contrast with stereotactic protocol for surgical planning. Order entered.

## 2020-08-28 NOTE — Progress Notes (Signed)
Physical Therapy Treatment Patient Details Name: [name] [name] MRN: [id number] DOB: May 02, 1956 Today's Date: 08/28/2020    History of Present Illness Pt is a 65 y.o. male who presented 4/6 with R-sided weakness and numbness. Noncontrast head CT concerning for new 3.1 cm mass lesion involving the left posterior parietal convexity with large area of surrounding vasogenic edema and associated mass-effect. This is consistent with metastatic disease. PMH: HTN, anemia, bullous emphysema, COPD, and non-small cell lung cancer status post chemoradiation and now on immunotherapy.    PT Comments    Pt eager to mobilize today. Pt ambulatory without AD, but requires HHA and min steadying assist from PT. PT challenging pt's balance via standing exercise intervention, tolerates well with increased time and rest breaks as needed. Pt remains appropriate for CIR, especially with upcoming surgery on Thursday. Will continue to follow acutely.     Follow Up Recommendations  CIR;Supervision for mobility/OOB     Equipment Recommendations  Rolling walker with 5" wheels;3in1 (PT)    Recommendations for Other Services       Precautions / Restrictions Precautions Precautions: Fall Restrictions Weight Bearing Restrictions: No    Mobility  Bed Mobility               General bed mobility comments: up in chair upon PT arrival to room    Transfers Overall transfer level: Needs assistance Equipment used: Rolling walker (2 wheeled) Transfers: Sit to/from Stand Sit to Stand: Min guard         General transfer comment: for safety, verbal and tactile cuing for proper hand placement when rising/sitting  Ambulation/Gait Ambulation/Gait assistance: Min assist Gait Distance (Feet): 100 Feet (standing rest break and exercises, +50) Assistive device: 1 person hand held assist Gait Pattern/deviations: Decreased stride length;Decreased dorsiflexion - right;Trunk flexed;Step-through pattern;Drifts  right/left;Staggering left Gait velocity: decr   General Gait Details: min assist to steady, initially ambulatory with RW but transitioned to HHA to increase challenge to pt's dynamic balance. VC for maintaining straight trajectory, navigating hallways.   Stairs             Wheelchair Mobility    Modified Rankin (Stroke Patients Only) Modified Rankin (Stroke Patients Only) Pre-Morbid Rankin Score: No symptoms Modified Rankin: Moderately severe disability     Balance Overall balance assessment: Needs assistance Sitting-balance support: No upper extremity supported;Feet supported Sitting balance-Leahy Scale: Good Sitting balance - Comments: able to sit EOB/chair without PT assist   Standing balance support: Bilateral upper extremity supported;During functional activity Standing balance-Leahy Scale: Poor Standing balance comment: reliant on external support, either RW or PT HHA                            Cognition Arousal/Alertness: Awake/alert Behavior During Therapy: WFL for tasks assessed/performed Overall Cognitive Status: Impaired/Different from baseline Area of Impairment: Attention;Safety/judgement;Problem solving;Awareness                   Current Attention Level: Selective   Following Commands: Follows one step commands consistently;Follows multi-step commands inconsistently Safety/Judgement: Decreased awareness of safety;Decreased awareness of deficits Awareness: Emergent Problem Solving: Slow processing;Difficulty sequencing;Requires verbal cues General Comments: A&Ox4, increased processing time and repeated cuing for multimodal mobility commands. Pt is very kind and hard-working      Exercises General Exercises - Lower Extremity Hip Flexion/Marching: AROM;Left;15 reps;Right;10 reps;Standing;Other (comment) (with bilateral UEs in WB through bathroom counter) Other Exercises Other Exercises: mini-squat to heel raise + RUE wall tap high  on wall, x5    General Comments General comments (skin integrity, edema, etc.): no double vision reported during session      Pertinent Vitals/Pain Pain Assessment: Faces Faces Pain Scale: No hurt Pain Intervention(s): Limited activity within patient's tolerance;Monitored during session    Home Living                      Prior Function            PT Goals (current goals can now be found in the care plan section) Acute Rehab PT Goals Patient Stated Goal: to get better PT Goal Formulation: With patient Time For Goal Achievement: 09/06/20 Potential to Achieve Goals: Fair Progress towards PT goals: Progressing toward goals    Frequency    Min 4X/week      PT Plan Current plan remains appropriate    Co-evaluation              AM-PAC PT "6 Clicks" Mobility   Outcome Measure  Help needed turning from your back to your side while in a flat bed without using bedrails?: None Help needed moving from lying on your back to sitting on the side of a flat bed without using bedrails?: None Help needed moving to and from a bed to a chair (including a wheelchair)?: A Little Help needed standing up from a chair using your arms (e.g., wheelchair or bedside chair)?: A Little Help needed to walk in hospital room?: A Little Help needed climbing 3-5 steps with a railing? : A Lot 6 Click Score: 19    End of Session Equipment Utilized During Treatment: Gait belt Activity Tolerance: Patient tolerated treatment well Patient left: with call bell/phone within reach;in chair;with chair alarm set Nurse Communication: Mobility status PT Visit Diagnosis: Unsteadiness on feet (R26.81);Other abnormalities of gait and mobility (R26.89);Muscle weakness (generalized) (M62.81);Difficulty in walking, not elsewhere classified (R26.2);Other symptoms and signs involving the nervous system (R29.898);Hemiplegia and hemiparesis Hemiplegia - Right/Left: Right Hemiplegia - dominant/non-dominant:  Non-dominant 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

## 2020-08-28 NOTE — Telephone Encounter (Signed)
Scheduled appt per 4/11 sch msg. Pt aware.  

## 2020-08-28 NOTE — Progress Notes (Signed)
PROGRESS NOTE Brief same day note  [name] [name]  ZOX:[id number] DOB: 1955-09-21 DOA: 08/22/2020 PCP: [name], NP   Chief Complain: Right arm/right leg numbness/weakness  Brief Narrative: Patient is a 66 year old male with history of COPD, non-small cell lung cancer status post chemoradiation, now on immunotherapy, active smoker who presented to the emergency department with further evaluation of right-sided weakness.  On presentation, he was hemodynamically stable.  Noncontrast head CT showed new 3.1 centimeters mass lesion involving the left posterior parietal convexity with large area of surrounding vasogenic edema and associated mass-effect.  CT with contrast confirmed 3.2 x 2.2 x 3 cm intra-axial mass lesion consistent with metastasis neurology consulted.  Started on Keppra, Decadron. Oncology,neurooncology and neurosurgery consulted,planning for  stereotactic radiosurgery followed by surgical resection/cranitomy ,palnned for 4/14  PT recommending inpatient rehab on discharge.   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  CT with contrast confirmed 3.2 x 2.2 x 3 cm intra-axial mass lesion consistent with metastasis.   Oncology,neurooncology and neurosurgery consulted,planning for  stereotactic radiosurgery followed by surgical resection on 4/14. Continue Decadron, also on Keppra 500 mg twice daily.. Currently alert and oriented but was confused on presentation. He has weakness on the right side  Non-small cell lung cancer: Follows with Dr. Julien Nordmann, status post chemoradiation, now on chemotherapy with Imfinzi.  COPD: Currently stable, not in exacerbation.  Continue bronchodilators.  On room air.  Right-sided weakness: PT recommended CIR on  discharge.         DVT prophylaxis:Lovenox Code Status: Full Family Communication: Called friend on phone on 08/24/20,call not received Status is: Inpatient   Dispo: The patient is from: Home              Anticipated d/c is to: CIR              Patient currently is not medically stable to d/c.   Difficult to place patient No     Consultants: Neurology  Procedures:None  Antimicrobials:  Anti-infectives (From admission, onward)   None      Subjective: Patient seen and examined the bedside this morning.  Hemodynamically stable.  Comfortable.  No new complaints  Objective: Vitals:   08/27/20 1926 08/27/20 2046 08/27/20 2314 08/28/20 0359  BP: 101/63  110/64 112/72  Pulse: 87  70 77  Resp: 20  20 20   Temp: 97.6 F (36.4 C)  98.1 F (36.7 C) 98.2 F (36.8 C)  TempSrc: Oral  Oral Oral  SpO2: 96% 96% 95% 97%  Weight:      Height:        Intake/Output Summary (Last 24 hours) at 08/28/2020 0740 Last data filed at 08/27/2020 2315 Gross per 24 hour  Intake 647 ml  Output 850 ml  Net -203 ml   Filed Weights   08/23/20 0552  Weight: 56.3 kg    Examination:  General exam: Overall comfortable, not in distress HEENT: PERRL Respiratory system:  no wheezes or crackles  Cardiovascular system: S1 & S2 heard, RRR.  Gastrointestinal system: Abdomen is nondistended, soft and nontender. Central nervous system: Alert and oriented, mild weakness on the right side. Extremities: No edema, no clubbing ,no cyanosis Skin: No rashes, no ulcers,no icterus   Data Reviewed: I have personally reviewed following labs and imaging studies  CBC: Recent Labs  Lab 08/22/20 1411 08/23/20 0311 08/24/20 0024 08/25/20 0223  WBC 3.8* 5.6 5.4 8.2  NEUTROABS 1.9  --   --   --   HGB 13.5 13.5 12.4* 12.0*  HCT 41.3 42.0 38.2* 37.4*  MCV 80.7 80.5 78.4* 80.8  PLT 276 304 273 245   Basic Metabolic Panel: Recent Labs  Lab 08/22/20 1411 08/23/20 0311 08/24/20 0024 08/25/20 0223   NA 134* 135 135 138  K 3.8 4.2 4.1 4.2  CL 104 102 103 107  CO2 22 24 21* 25  GLUCOSE 74 79 160* 127*  BUN 5* 8 8 16   CREATININE 0.68 0.78 1.02 0.99  CALCIUM 8.7* 9.3 9.3 9.3   GFR: Estimated Creatinine Clearance: 56.5 mL/min (by C-G formula based on SCr of 0.99 mg/dL). Liver Function Tests: Recent Labs  Lab 08/22/20 1411  AST 22  ALT 13  ALKPHOS 50  BILITOT 0.4  PROT 7.1  ALBUMIN 3.9   No results for input(s): LIPASE, AMYLASE in the last 168 hours. No results for input(s): AMMONIA in the last 168 hours. Coagulation Profile: No results for input(s): INR, PROTIME in the last 168 hours. Cardiac Enzymes: No results for input(s): CKTOTAL, CKMB, CKMBINDEX, TROPONINI in the last 168 hours. BNP (last 3 results) No results for input(s): PROBNP in the last 8760 hours. HbA1C: No results for input(s): HGBA1C in the last 72 hours. CBG: No results for input(s): GLUCAP in the last 168 hours. Lipid Profile: No results for input(s): CHOL, HDL, LDLCALC, TRIG, CHOLHDL, LDLDIRECT in the last 72 hours. Thyroid Function Tests: No results for input(s): TSH, T4TOTAL, FREET4, T3FREE, THYROIDAB in the last 72 hours. Anemia Panel: No results for input(s): VITAMINB12, FOLATE, FERRITIN, TIBC, IRON, RETICCTPCT in the last 72 hours. Sepsis Labs: No results for input(s): PROCALCITON, LATICACIDVEN in the last 168 hours.  Recent Results (from the past 240 hour(s))  Resp Panel by RT-PCR (Flu A&B, Covid) Nasopharyngeal Swab     Status: None   Collection Time: 08/23/20 12:10 AM   Specimen: Nasopharyngeal Swab; Nasopharyngeal(NP) swabs in vial transport medium  Result Value Ref Range Status   SARS Coronavirus 2 by RT PCR NEGATIVE NEGATIVE Final    Comment: (NOTE) SARS-CoV-2 target nucleic acids are NOT DETECTED.  The SARS-CoV-2 RNA is generally detectable in upper respiratory specimens during the acute phase of infection. The lowest concentration of SARS-CoV-2 viral copies this assay can detect  is 138 copies/mL. A negative result does not preclude SARS-Cov-2 infection and should not be used as the sole basis for treatment or other patient management decisions. A negative result may occur with  improper specimen collection/handling, submission of specimen other than nasopharyngeal swab, presence of viral mutation(s) within the areas targeted by this assay, and inadequate number of viral copies(<138 copies/mL). A negative result must be combined with clinical observations, patient history, and epidemiological information. The expected result is Negative.  Fact Sheet for Patients:  EntrepreneurPulse.com.au  Fact Sheet for Healthcare Providers:  IncredibleEmployment.be  This test is no t yet approved or cleared by the [place] FDA and  has been authorized for detection and/or diagnosis of SARS-CoV-2 by FDA under an Emergency Use Authorization (EUA). This EUA will remain  in effect (meaning this test can be used) for the duration of the COVID-19 declaration under Section 564(b)(1) of the Act, 21 U.S.C.section 360bbb-3(b)(1), unless the authorization is terminated  or revoked sooner.       Influenza A by PCR NEGATIVE NEGATIVE Final   Influenza  B by PCR NEGATIVE NEGATIVE Final    Comment: (NOTE) The Xpert Xpress SARS-CoV-2/FLU/RSV plus assay is intended as an aid in the diagnosis of influenza from Nasopharyngeal swab specimens and should not be used as a sole basis for treatment. Nasal washings and aspirates are unacceptable for Xpert Xpress SARS-CoV-2/FLU/RSV testing.  Fact Sheet for Patients: EntrepreneurPulse.com.au  Fact Sheet for Healthcare Providers: IncredibleEmployment.be  This test is not yet approved or cleared by the [place] FDA and has been authorized for detection and/or diagnosis of SARS-CoV-2 by FDA under an Emergency Use Authorization (EUA). This EUA will remain in effect  (meaning this test can be used) for the duration of the COVID-19 declaration under Section 564(b)(1) of the Act, 21 U.S.C. section 360bbb-3(b)(1), unless the authorization is terminated or revoked.  Performed at [facility], [place] [address], [place], [place] [address]   Surgical PCR screen     Status: None   Collection Time: 08/23/20  4:28 AM   Specimen: Nasal Mucosa; Nasal Swab  Result Value Ref Range Status   MRSA, PCR NEGATIVE NEGATIVE Final   Staphylococcus aureus NEGATIVE NEGATIVE Final    Comment: (NOTE) The Xpert SA Assay (FDA approved for NASAL specimens in patients 11 years of age and older), is one component of a comprehensive surveillance program. It is not intended to diagnose infection nor to guide or monitor treatment. Performed at [facility], [place] [address], [place], [place] [address]          Radiology Studies: No results found.      Scheduled Meds: . dexamethasone  4 mg Oral Q6H  . enoxaparin (LOVENOX) injection  40 mg Subcutaneous Daily  . levETIRAcetam  500 mg Oral BID  . mometasone-formoterol  2 puff Inhalation BID  . pantoprazole  40 mg Oral Daily  . umeclidinium bromide  1 puff Inhalation Daily   Continuous Infusions:    LOS: 4 days    Time spent: 25 mins.More than 50% of that time was spent in counseling and/or coordination of care.      [name], MD [facility] P4/04/2021, 7:40 AM

## 2020-08-29 NOTE — Progress Notes (Signed)
Inpatient Rehabilitation Admissions Coordinator  I spoke with [name] [name] by phone to further evaluate his needs for dispo. She has been working with [name] [name], at [employer] center to apply for disability over the past year. He has yet to qualify but I have recommended she follow up now that he has new medical issues. I have contacted Shanon Rosser in financial counseling to ask for disability and medicaid evaluations. Patient states his plans are to return home alone as he was able to manage his adls, home management, etc prior to admit. I await therapy postop and further discussions with [name] [name] postoperatively to assist with planning dispo needs.  Danne Baxter, RN, MSN Rehab Admissions Coordinator [phone] 08/29/2020 11:27 AM

## 2020-08-29 NOTE — Progress Notes (Signed)
Phoned [facility]. Spoke with [place] with intentions of arranging [facility] transport from [place] to [facility] by [phone] tomorrow for pre op SRS treatment. [place] reports transportation has already been arranged. Verbalized appreciation for the confirmation.

## 2020-08-29 NOTE — Progress Notes (Signed)
  NEUROSURGERY PROGRESS NOTE   No issues overnight. No concerns this am  EXAM:  BP 110/66 (BP Location: Right Arm)   Pulse 63   Temp (!) 97.4 F (36.3 C) (Oral)   Resp 20   Ht $R'5\' 1"'Gt$  (1.549 m)   Wt 56.3 kg   SpO2 96%   BMI 23.45 kg/m   Awake, alert, oriented  Speech fluent, appropriate  CN grossly intact  5/5 LUE/LLE 4-4+/5 RUE/RLE   IMPRESSION/PLAN 65 y.o. male with left parietal non-small cell lung CA met and associated right hemiparesis. Plan for resection tomorrow with pre op SRS.  - npo at midnight - d/c all blood thinning medications

## 2020-08-29 NOTE — Progress Notes (Signed)
PROGRESS NOTE Brief same day note  Karol Liendo  UXN:[id number] DOB: 03-29-1956 DOA: 08/22/2020 PCP: [name], NP   Chief Complain: Right arm/right leg numbness/weakness  Brief Narrative: Patient is a 65 year old male with history of COPD, non-small cell lung cancer status post chemoradiation, now on immunotherapy, active smoker who presented to the emergency department with further evaluation of right-sided weakness.  On presentation, he was hemodynamically stable.  Noncontrast head CT showed new 3.1 cm mass lesion involving the left posterior parietal convexity with large area of surrounding vasogenic edema and associated mass-effect.  CT with contrast confirmed 3.2 x 2.2 x 3 cm intra-axial mass lesion consistent with metastasis.  Started on Keppra, Decadron. Oncology,neurooncology and neurosurgery consulted,planning for  preop stereotactic radiosurgery followed by surgical resection/cranitomy on  4/14  PT recommending inpatient rehab on discharge.   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  CT with contrast confirmed 3.2 x 2.2 x 3 cm intra-axial mass lesion consistent with metastasis.   Oncology,neurooncology and neurosurgery consulted,planning for  preop stereotactic radiosurgery followed by surgical resection/cranitomy on  4/14 Continue Decadron, also on Keppra 500 mg twice daily.. Currently alert and oriented but was confused on presentation. He has mild weakness on the right side  Non-small cell lung cancer: Follows with Dr. Julien Nordmann, status post chemoradiation, now on chemotherapy with Imfinzi.  COPD: Currently stable, not in exacerbation.  Continue bronchodilators.  On room air.  Right-sided weakness: PT recommended CIR on discharge.          DVT prophylaxis:Lovenox Code Status: Full Family Communication: Called friend on phone on 08/24/20,call not received.  Patient states they are aware about the plan Status is: Inpatient   Dispo: The patient is from: Home              Anticipated d/c is to: CIR              Patient currently is not medically stable to d/c.   Difficult to place patient No     Consultants: Neurology  Procedures:None  Antimicrobials:  Anti-infectives (From admission, onward)   None      Subjective: Patient seen and examined the bedside this morning.  Hemodynamically stable.  Comfortable.  Sitting on the chair.  Denies any new complaints  Objective: Vitals:   08/28/20 2012 08/28/20 2323 08/29/20 0329 08/29/20 0700  BP:  123/82 110/66 110/72  Pulse:  80 63 71  Resp:  20 20 18   Temp:  97.9 F (36.6 C) (!) 97.4 F (36.3 C) 98.2 F (36.8 C)  TempSrc:  Oral Oral Oral  SpO2: 96% 95% 96% 99%  Weight:      Height:        Intake/Output Summary (Last 24 hours) at 08/29/2020 0752 Last data filed at 08/29/2020 0331 Gross per 24 hour  Intake 250 ml  Output 2250 ml  Net -2000 ml   Filed Weights   08/23/20 0552  Weight: 56.3 kg    Examination:  General exam: Overall comfortable, not in distress, pleasant HEENT: PERRL Respiratory system:  no wheezes or crackles  Cardiovascular system: S1 & S2 heard, RRR.  Gastrointestinal system: Abdomen is nondistended, soft and nontender. Central nervous system: Alert and oriented, mild weakness on the right side with motor of 4+/5 on the right  upper and right lower extremity Extremities: No edema, no clubbing ,no cyanosis Skin: No rashes, no ulcers,no icterus   Data Reviewed: I have personally reviewed following labs and imaging studies  CBC: Recent Labs  Lab 08/22/20 1411 08/23/20 0311 08/24/20 0024 08/25/20 0223  WBC 3.8* 5.6 5.4 8.2  NEUTROABS 1.9  --   --   --   HGB 13.5 13.5 12.4* 12.0*  HCT 41.3 42.0 38.2* 37.4*  MCV 80.7 80.5 78.4*  80.8  PLT 276 304 273 751   Basic Metabolic Panel: Recent Labs  Lab 08/22/20 1411 08/23/20 0311 08/24/20 0024 08/25/20 0223  NA 134* 135 135 138  K 3.8 4.2 4.1 4.2  CL 104 102 103 107  CO2 22 24 21* 25  GLUCOSE 74 79 160* 127*  BUN 5* 8 8 16   CREATININE 0.68 0.78 1.02 0.99  CALCIUM 8.7* 9.3 9.3 9.3   GFR: Estimated Creatinine Clearance: 56.5 mL/min (by C-G formula based on SCr of 0.99 mg/dL). Liver Function Tests: Recent Labs  Lab 08/22/20 1411  AST 22  ALT 13  ALKPHOS 50  BILITOT 0.4  PROT 7.1  ALBUMIN 3.9   No results for input(s): LIPASE, AMYLASE in the last 168 hours. No results for input(s): AMMONIA in the last 168 hours. Coagulation Profile: No results for input(s): INR, PROTIME in the last 168 hours. Cardiac Enzymes: No results for input(s): CKTOTAL, CKMB, CKMBINDEX, TROPONINI in the last 168 hours. BNP (last 3 results) No results for input(s): PROBNP in the last 8760 hours. HbA1C: No results for input(s): HGBA1C in the last 72 hours. CBG: No results for input(s): GLUCAP in the last 168 hours. Lipid Profile: No results for input(s): CHOL, HDL, LDLCALC, TRIG, CHOLHDL, LDLDIRECT in the last 72 hours. Thyroid Function Tests: No results for input(s): TSH, T4TOTAL, FREET4, T3FREE, THYROIDAB in the last 72 hours. Anemia Panel: No results for input(s): VITAMINB12, FOLATE, FERRITIN, TIBC, IRON, RETICCTPCT in the last 72 hours. Sepsis Labs: No results for input(s): PROCALCITON, LATICACIDVEN in the last 168 hours.  Recent Results (from the past 240 hour(s))  Resp Panel by RT-PCR (Flu A&B, Covid) Nasopharyngeal Swab     Status: None   Collection Time: 08/23/20 12:10 AM   Specimen: Nasopharyngeal Swab; Nasopharyngeal(NP) swabs in vial transport medium  Result Value Ref Range Status   SARS Coronavirus 2 by RT PCR NEGATIVE NEGATIVE Final    Comment: (NOTE) SARS-CoV-2 target nucleic acids are NOT DETECTED.  The SARS-CoV-2 RNA is generally detectable in upper  respiratory specimens during the acute phase of infection. The lowest concentration of SARS-CoV-2 viral copies this assay can detect is 138 copies/mL. A negative result does not preclude SARS-Cov-2 infection and should not be used as the sole basis for treatment or other patient management decisions. A negative result may occur with  improper specimen collection/handling, submission of specimen other than nasopharyngeal swab, presence of viral mutation(s) within the areas targeted by this assay, and inadequate number of viral copies(<138 copies/mL). A negative result must be combined with clinical observations, patient history, and epidemiological information. The expected result is Negative.  Fact Sheet for Patients:  EntrepreneurPulse.com.au  Fact Sheet for Healthcare Providers:  IncredibleEmployment.be  This test is no t yet approved or cleared by the [place] FDA and  has been authorized for detection and/or diagnosis of SARS-CoV-2 by FDA under an Emergency Use Authorization (EUA). This EUA will remain  in effect (meaning this test can be used) for the duration of the COVID-19 declaration under Section  564(b)(1) of the Act, 21 U.S.C.section 360bbb-3(b)(1), unless the authorization is terminated  or revoked sooner.       Influenza A by PCR NEGATIVE NEGATIVE Final   Influenza B by PCR NEGATIVE NEGATIVE Final    Comment: (NOTE) The Xpert Xpress SARS-CoV-2/FLU/RSV plus assay is intended as an aid in the diagnosis of influenza from Nasopharyngeal swab specimens and should not be used as a sole basis for treatment. Nasal washings and aspirates are unacceptable for Xpert Xpress SARS-CoV-2/FLU/RSV testing.  Fact Sheet for Patients: EntrepreneurPulse.com.au  Fact Sheet for Healthcare Providers: IncredibleEmployment.be  This test is not yet approved or cleared by the [place] FDA and has been  authorized for detection and/or diagnosis of SARS-CoV-2 by FDA under an Emergency Use Authorization (EUA). This EUA will remain in effect (meaning this test can be used) for the duration of the COVID-19 declaration under Section 564(b)(1) of the Act, 21 U.S.C. section 360bbb-3(b)(1), unless the authorization is terminated or revoked.  Performed at [facility], [place] [address] [place], [place] [address]   Surgical PCR screen     Status: None   Collection Time: 08/23/20  4:28 AM   Specimen: Nasal Mucosa; Nasal Swab  Result Value Ref Range Status   MRSA, PCR NEGATIVE NEGATIVE Final   Staphylococcus aureus NEGATIVE NEGATIVE Final    Comment: (NOTE) The Xpert SA Assay (FDA approved for NASAL specimens in patients 65 years of age and older), is one component of a comprehensive surveillance program. It is not intended to diagnose infection nor to guide or monitor treatment. Performed at [facility], [place] [address], [place], [place] [address]          Radiology Studies: CT HEAD W & WO CONTRAST  Result Date: 08/29/2020 CLINICAL DATA:  Initial evaluation for brain mass, preoperative planning. EXAM: CT HEAD WITHOUT AND WITH CONTRAST TECHNIQUE: Contiguous axial images were obtained from the base of the skull through the vertex without and with intravenous contrast CONTRAST:  32mL OMNIPAQUE IOHEXOL 300 MG/ML  SOLN COMPARISON:  Prior head CT from 08/23/2020 FINDINGS: Brain: Again seen is a focal intraparenchymal enhancing mass centered along the gray-white matter differentiation at the left parietal convexity. Lesion measures 3.1 x 1.9 x 3.3 cm (transverse by craniocaudad by AP). Surrounding vasogenic edema throughout the posterior left frontoparietal region without significant regional mass effect. No other mass lesion or abnormal enhancement. Underlying age-related cerebral atrophy with mild chronic small vessel ischemic disease. Remote lacunar infarct present at the right caudate  head. 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 Postsurgical encephalomalacia present at the anterior right temporal pole. No acute intracranial hemorrhage. No other acute large vessel territory infarct. No hydrocephalus or extra-axial fluid collection. Vascular: No hyperdense vessel on precontrast images. Normal intravascular enhancement seen following contrast administration. Prior surgical clipping of aneurysm near the right ICA terminus. Skull: Remote right pterional craniotomy. No focal osseous lesions. Scalp soft tissues demonstrate no acute finding. Sinuses/Orbits: Globes and orbital soft tissues within normal limits. Paranasal sinuses are largely clear. Small right mastoid effusion noted, of doubtful significance. Other: None. IMPRESSION: 1. 3.1 x 1.9 x 3.3 cm enhancing mass centered along the gray-white matter differentiation at the left parietal convexity with surrounding vasogenic edema. A solitary intracranial metastasis is favored. A primary CNS neoplasm would be the primary differential consideration. 2. 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 3. 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 Electronically Signed  By: Jeannine Boga M.D.   On: 08/29/2020 00:07        Scheduled Meds: . dexamethasone  4 mg Oral Q6H  . enoxaparin (LOVENOX) injection  40 mg Subcutaneous Daily  . levETIRAcetam  500 mg Oral BID  . mometasone-formoterol  2 puff Inhalation BID  . pantoprazole  40 mg Oral Daily  . umeclidinium bromide  1 puff Inhalation Daily   Continuous Infusions:    LOS: 5 days    Time spent: 25 mins.More than 50% of that time was spent in counseling and/or coordination of care.      [name], MD [facility] P4/13/2022, 7:52 AM

## 2020-08-30 ENCOUNTER — Inpatient Hospital Stay (HOSPITAL_COMMUNITY): Payer: Medicaid Other

## 2020-08-30 ENCOUNTER — Inpatient Hospital Stay (HOSPITAL_COMMUNITY)
Admission: EM | Disposition: A | Discharge status: Home Health | Payer: Self-pay | Source: Home / Self Care | Attending: Internal Medicine

## 2020-08-30 ENCOUNTER — Inpatient Hospital Stay: Payer: Medicaid Other | Admitting: Physician Assistant

## 2020-08-30 ENCOUNTER — Inpatient Hospital Stay: Payer: Medicaid Other

## 2020-08-30 ENCOUNTER — Encounter (HOSPITAL_COMMUNITY): Payer: Self-pay | Admitting: Internal Medicine

## 2020-08-30 ENCOUNTER — Ambulatory Visit
Admit: 2020-08-30 | Discharge: 2020-08-30 | Disposition: A | Discharge status: Home / Self Care | Payer: Self-pay | Attending: Radiation Oncology | Admitting: Radiation Oncology

## 2020-08-30 ENCOUNTER — Encounter: Payer: Self-pay | Admitting: Radiation Oncology

## 2020-08-30 VITALS — BP 142/86 | HR 85 | Temp 97.5°F | Resp 18

## 2020-08-30 DIAGNOSIS — C7931 Secondary malignant neoplasm of brain: Secondary | ICD-10-CM

## 2020-08-30 HISTORY — PX: CRANIOTOMY: SHX93

## 2020-08-30 HISTORY — PX: APPLICATION OF CRANIAL NAVIGATION: SHX6578

## 2020-08-30 LAB — TYPE AND SCREEN
ABO/RH(D): O POS
Antibody Screen: NEGATIVE

## 2020-08-30 LAB — ABO/RH: ABO/RH(D): O POS

## 2020-08-30 SURGERY — CRANIOTOMY TUMOR EXCISION
Anesthesia: General

## 2020-08-30 MED ORDER — HEMOSTATIC AGENTS (NO CHARGE) OPTIME
TOPICAL | Status: DC | PRN
  Administered 2020-08-30: 1 via TOPICAL

## 2020-08-30 MED ORDER — LIDOCAINE 2% (20 MG/ML) 5 ML SYRINGE
INTRAMUSCULAR | Status: AC
  Filled 2020-08-30: qty 5

## 2020-08-30 MED ORDER — ORAL CARE MOUTH RINSE: 15.0000 mL | Freq: Once | OROMUCOSAL | Status: AC

## 2020-08-30 MED ORDER — ACETAMINOPHEN 650 MG RE SUPP: 650.0000 mg | RECTAL | Status: DC | PRN

## 2020-08-30 MED ORDER — BISACODYL 5 MG PO TBEC
5.0000 mg | DELAYED_RELEASE_TABLET | Freq: Every day | ORAL | Status: DC | PRN
  Administered 2020-09-01: 5 mg via ORAL
  Filled 2020-08-30: qty 1

## 2020-08-30 MED ORDER — HYDROMORPHONE HCL 1 MG/ML IJ SOLN
0.5000 mg | INTRAMUSCULAR | Status: DC | PRN
  Administered 2020-08-31: 0.5 mg via INTRAVENOUS
  Filled 2020-08-30: qty 1

## 2020-08-30 MED ORDER — ACETAMINOPHEN 325 MG PO TABS
650.0000 mg | ORAL_TABLET | ORAL | Status: DC | PRN
  Administered 2020-09-01 – 2020-09-16 (×11): 650 mg via ORAL
  Filled 2020-08-30 (×12): qty 2

## 2020-08-30 MED ORDER — BUPIVACAINE HCL (PF) 0.5 % IJ SOLN
INTRAMUSCULAR | Status: AC
  Filled 2020-08-30: qty 30

## 2020-08-30 MED ORDER — LIDOCAINE HCL (CARDIAC) PF 100 MG/5ML IV SOSY
PREFILLED_SYRINGE | INTRAVENOUS | Status: DC | PRN
  Administered 2020-08-30: 30 mg via INTRAVENOUS

## 2020-08-30 MED ORDER — THROMBIN 20000 UNITS EX SOLR: CUTANEOUS | Status: DC | PRN

## 2020-08-30 MED ORDER — PROMETHAZINE HCL 12.5 MG PO TABS
12.5000 mg | ORAL_TABLET | ORAL | Status: DC | PRN
Start: 2020-08-30 — End: 2020-09-21
  Filled 2020-08-30: qty 2

## 2020-08-30 MED ORDER — BACITRACIN ZINC 500 UNIT/GM EX OINT
TOPICAL_OINTMENT | CUTANEOUS | Status: AC
  Filled 2020-08-30: qty 28.35

## 2020-08-30 MED ORDER — CHLORHEXIDINE GLUCONATE 0.12 % MT SOLN
OROMUCOSAL | Status: AC
  Administered 2020-08-30: 15 mL via OROMUCOSAL
  Filled 2020-08-30: qty 15

## 2020-08-30 MED ORDER — FENTANYL CITRATE (PF) 100 MCG/2ML IJ SOLN: 25.0000 ug | INTRAMUSCULAR | Status: DC | PRN

## 2020-08-30 MED ORDER — POLYETHYLENE GLYCOL 3350 17 G PO PACK: 17.0000 g | PACK | Freq: Every day | ORAL | Status: DC | PRN

## 2020-08-30 MED ORDER — FENTANYL CITRATE (PF) 250 MCG/5ML IJ SOLN
INTRAMUSCULAR | Status: AC
  Filled 2020-08-30: qty 5

## 2020-08-30 MED ORDER — NALOXONE HCL 0.4 MG/ML IJ SOLN: 0.0800 mg | INTRAMUSCULAR | Status: DC | PRN

## 2020-08-30 MED ORDER — DEXAMETHASONE SODIUM PHOSPHATE 10 MG/ML IJ SOLN
INTRAMUSCULAR | Status: AC
  Filled 2020-08-30: qty 2

## 2020-08-30 MED ORDER — PHENYLEPHRINE HCL-NACL 10-0.9 MG/250ML-% IV SOLN
INTRAVENOUS | Status: DC | PRN
  Administered 2020-08-30: 50 ug/min via INTRAVENOUS

## 2020-08-30 MED ORDER — MIDAZOLAM HCL 2 MG/2ML IJ SOLN
INTRAMUSCULAR | Status: AC
  Filled 2020-08-30: qty 2

## 2020-08-30 MED ORDER — LEVETIRACETAM IN NACL 1000 MG/100ML IV SOLN
1000.0000 mg | INTRAVENOUS | Status: AC
  Administered 2020-08-30: 1000 mg via INTRAVENOUS
  Filled 2020-08-30: qty 100

## 2020-08-30 MED ORDER — THROMBIN 5000 UNITS EX SOLR: OROMUCOSAL | Status: DC | PRN

## 2020-08-30 MED ORDER — BUPIVACAINE HCL (PF) 0.5 % IJ SOLN
INTRAMUSCULAR | Status: DC | PRN
  Administered 2020-08-30: 3.5 mL

## 2020-08-30 MED ORDER — PROPOFOL 10 MG/ML IV BOLUS
INTRAVENOUS | Status: AC
  Filled 2020-08-30: qty 20

## 2020-08-30 MED ORDER — ONDANSETRON HCL 4 MG/2ML IJ SOLN: 4.0000 mg | INTRAMUSCULAR | Status: DC | PRN

## 2020-08-30 MED ORDER — BACITRACIN ZINC 500 UNIT/GM EX OINT
TOPICAL_OINTMENT | CUTANEOUS | Status: DC | PRN
  Administered 2020-08-30 (×2): 1 via TOPICAL

## 2020-08-30 MED ORDER — PHENYLEPHRINE 40 MCG/ML (10ML) SYRINGE FOR IV PUSH (FOR BLOOD PRESSURE SUPPORT)
PREFILLED_SYRINGE | INTRAVENOUS | Status: AC
  Filled 2020-08-30: qty 20

## 2020-08-30 MED ORDER — CEFAZOLIN SODIUM-DEXTROSE 2-4 GM/100ML-% IV SOLN
2.0000 g | INTRAVENOUS | Status: DC
  Filled 2020-08-30 (×2): qty 100

## 2020-08-30 MED ORDER — THROMBIN 20000 UNITS EX KIT
PACK | CUTANEOUS | Status: AC
  Filled 2020-08-30: qty 1

## 2020-08-30 MED ORDER — FLEET ENEMA 7-19 GM/118ML RE ENEM: 1.0000 | ENEMA | Freq: Once | RECTAL | Status: DC | PRN

## 2020-08-30 MED ORDER — CEFAZOLIN SODIUM-DEXTROSE 2-4 GM/100ML-% IV SOLN
2.0000 g | Freq: Three times a day (TID) | INTRAVENOUS | Status: AC
  Administered 2020-08-30 – 2020-08-31 (×2): 2 g via INTRAVENOUS
  Filled 2020-08-30 (×2): qty 100

## 2020-08-30 MED ORDER — ROCURONIUM BROMIDE 10 MG/ML (PF) SYRINGE
PREFILLED_SYRINGE | INTRAVENOUS | Status: AC
  Filled 2020-08-30: qty 20

## 2020-08-30 MED ORDER — LIDOCAINE-EPINEPHRINE 1 %-1:100000 IJ SOLN
INTRAMUSCULAR | Status: DC | PRN
  Administered 2020-08-30: 3.5 mL

## 2020-08-30 MED ORDER — LABETALOL HCL 5 MG/ML IV SOLN: 10.0000 mg | INTRAVENOUS | Status: DC | PRN

## 2020-08-30 MED ORDER — LIDOCAINE-EPINEPHRINE 1 %-1:100000 IJ SOLN
INTRAMUSCULAR | Status: AC
  Filled 2020-08-30: qty 1

## 2020-08-30 MED ORDER — PANTOPRAZOLE SODIUM 40 MG IV SOLR
40.0000 mg | Freq: Every day | INTRAVENOUS | Status: DC
  Administered 2020-08-30 – 2020-08-31 (×2): 40 mg via INTRAVENOUS
  Filled 2020-08-30 (×2): qty 40

## 2020-08-30 MED ORDER — CHLORHEXIDINE GLUCONATE CLOTH 2 % EX PADS
6.0000 | MEDICATED_PAD | Freq: Every day | CUTANEOUS | Status: DC
  Administered 2020-08-31 – 2020-09-19 (×12): 6 via TOPICAL

## 2020-08-30 MED ORDER — CHLORHEXIDINE GLUCONATE 0.12 % MT SOLN: 15.0000 mL | Freq: Once | OROMUCOSAL | Status: AC

## 2020-08-30 MED ORDER — FENTANYL CITRATE (PF) 100 MCG/2ML IJ SOLN
INTRAMUSCULAR | Status: DC | PRN
  Administered 2020-08-30: 100 ug via INTRAVENOUS
  Administered 2020-08-30: 150 ug via INTRAVENOUS

## 2020-08-30 MED ORDER — SODIUM CHLORIDE 0.9 % IV SOLN: INTRAVENOUS | Status: DC | PRN

## 2020-08-30 MED ORDER — ONDANSETRON HCL 4 MG PO TABS: 4.0000 mg | ORAL_TABLET | ORAL | Status: DC | PRN

## 2020-08-30 MED ORDER — SODIUM CHLORIDE 0.9 % IV SOLN: INTRAVENOUS | Status: DC

## 2020-08-30 MED ORDER — HYDROCODONE-ACETAMINOPHEN 5-325 MG PO TABS
1.0000 | ORAL_TABLET | ORAL | Status: DC | PRN
  Administered 2020-08-30 – 2020-09-01 (×4): 1 via ORAL
  Filled 2020-08-30 (×5): qty 1

## 2020-08-30 MED ORDER — ONDANSETRON HCL 4 MG/2ML IJ SOLN
INTRAMUSCULAR | Status: AC
  Filled 2020-08-30: qty 4

## 2020-08-30 MED ORDER — OXYCODONE HCL 5 MG PO TABS: 5.0000 mg | ORAL_TABLET | Freq: Once | ORAL | Status: DC | PRN

## 2020-08-30 MED ORDER — IOHEXOL 300 MG/ML  SOLN
100.0000 mL | Freq: Once | INTRAMUSCULAR | Status: AC | PRN
  Administered 2020-08-30: 100 mL via INTRAVENOUS

## 2020-08-30 MED ORDER — ONDANSETRON HCL 4 MG/2ML IJ SOLN: 4.0000 mg | Freq: Once | INTRAMUSCULAR | Status: DC | PRN

## 2020-08-30 MED ORDER — DEXAMETHASONE SODIUM PHOSPHATE 10 MG/ML IJ SOLN
INTRAMUSCULAR | Status: DC | PRN
  Administered 2020-08-30: 10 mg via INTRAVENOUS

## 2020-08-30 MED ORDER — SENNA 8.6 MG PO TABS
1.0000 | ORAL_TABLET | Freq: Two times a day (BID) | ORAL | Status: DC
  Administered 2020-08-30 – 2020-09-21 (×42): 8.6 mg via ORAL
  Filled 2020-08-30 (×44): qty 1

## 2020-08-30 MED ORDER — OXYCODONE HCL 5 MG/5ML PO SOLN: 5.0000 mg | Freq: Once | ORAL | Status: DC | PRN

## 2020-08-30 MED ORDER — MIDAZOLAM HCL 5 MG/5ML IJ SOLN
INTRAMUSCULAR | Status: DC | PRN
  Administered 2020-08-30: 2 mg via INTRAVENOUS

## 2020-08-30 MED ORDER — PROPOFOL 10 MG/ML IV BOLUS
INTRAVENOUS | Status: DC | PRN
  Administered 2020-08-30: 50 mg via INTRAVENOUS
  Administered 2020-08-30: 150 mg via INTRAVENOUS

## 2020-08-30 MED ORDER — LEVETIRACETAM IN NACL 500 MG/100ML IV SOLN
500.0000 mg | Freq: Two times a day (BID) | INTRAVENOUS | Status: DC
  Administered 2020-08-30 – 2020-09-01 (×4): 500 mg via INTRAVENOUS
  Filled 2020-08-30 (×4): qty 100

## 2020-08-30 MED ORDER — 0.9 % SODIUM CHLORIDE (POUR BTL) OPTIME
TOPICAL | Status: DC | PRN
  Administered 2020-08-30 (×3): 1000 mL

## 2020-08-30 MED ORDER — THROMBIN 5000 UNITS EX SOLR
CUTANEOUS | Status: AC
  Filled 2020-08-30: qty 5000

## 2020-08-30 MED ORDER — ROCURONIUM BROMIDE 10 MG/ML (PF) SYRINGE
PREFILLED_SYRINGE | INTRAVENOUS | Status: DC | PRN
  Administered 2020-08-30: 60 mg via INTRAVENOUS

## 2020-08-30 SURGICAL SUPPLY — 102 items
APL SKNCLS STERI-STRIP NONHPOA (GAUZE/BANDAGES/DRESSINGS)
BAND INSRT 18 STRL LF DISP RB (MISCELLANEOUS) ×4
BAND RUBBER #18 3X1/16 STRL (MISCELLANEOUS) ×2 IMPLANT
BENZOIN TINCTURE PRP APPL 2/3 (GAUZE/BANDAGES/DRESSINGS) IMPLANT
BLADE CLIPPER SURG (BLADE) ×3 IMPLANT
BLADE SAW GIGLI 16 STRL (MISCELLANEOUS) IMPLANT
BLADE SURG 15 STRL LF DISP TIS (BLADE) IMPLANT
BLADE SURG 15 STRL SS (BLADE)
BLADE ULTRA TIP 2M (BLADE) ×3 IMPLANT
BNDG CMPR 75X41 PLY HI ABS (GAUZE/BANDAGES/DRESSINGS)
BNDG GAUZE ELAST 4 BULKY (GAUZE/BANDAGES/DRESSINGS) IMPLANT
BNDG STRETCH 4X75 STRL LF (GAUZE/BANDAGES/DRESSINGS) IMPLANT
BUR ACORN 6.0 PRECISION (BURR) ×3 IMPLANT
BUR ROUND FLUTED 4 SOFT TCH (BURR) IMPLANT
BUR SPIRAL ROUTER 2.3 (BUR) ×3 IMPLANT
CANISTER SUCT 3000ML PPV (MISCELLANEOUS) ×6 IMPLANT
CARTRIDGE OIL MAESTRO DRILL (MISCELLANEOUS) ×2 IMPLANT
CATH VENTRIC 35X38 W/TROCAR LG (CATHETERS) IMPLANT
CLIP VESOCCLUDE MED 6/CT (CLIP) IMPLANT
CNTNR URN SCR LID CUP LEK RST (MISCELLANEOUS) ×2 IMPLANT
CONT SPEC 4OZ STRL OR WHT (MISCELLANEOUS) ×3
COVER MAYO STAND STRL (DRAPES) IMPLANT
COVER WAND RF STERILE (DRAPES) ×2 IMPLANT
DECANTER SPIKE VIAL GLASS SM (MISCELLANEOUS) ×3 IMPLANT
DIFFUSER DRILL AIR PNEUMATIC (MISCELLANEOUS) ×3 IMPLANT
DRAIN SUBARACHNOID (WOUND CARE) IMPLANT
DRAPE HALF SHEET 40X57 (DRAPES) ×3 IMPLANT
DRAPE MICROSCOPE LEICA (MISCELLANEOUS) ×1 IMPLANT
DRAPE NEUROLOGICAL W/INCISE (DRAPES) ×3 IMPLANT
DRAPE STERI IOBAN 125X83 (DRAPES) IMPLANT
DRAPE SURG 17X23 STRL (DRAPES) IMPLANT
DRAPE WARM FLUID 44X44 (DRAPES) ×3 IMPLANT
DRSG ADAPTIC 3X8 NADH LF (GAUZE/BANDAGES/DRESSINGS) IMPLANT
DRSG TELFA 3X8 NADH (GAUZE/BANDAGES/DRESSINGS) ×3 IMPLANT
DURAPREP 6ML APPLICATOR 50/CS (WOUND CARE) ×3 IMPLANT
ELECT REM PT RETURN 9FT ADLT (ELECTROSURGICAL) ×3
ELECTRODE REM PT RTRN 9FT ADLT (ELECTROSURGICAL) ×2 IMPLANT
EVACUATOR 1/8 PVC DRAIN (DRAIN) IMPLANT
EVACUATOR SILICONE 100CC (DRAIN) IMPLANT
FORCEPS BIPOLAR SPETZLER 8 1.0 (NEUROSURGERY SUPPLIES) ×3 IMPLANT
GAUZE 4X4 16PLY RFD (DISPOSABLE) IMPLANT
GAUZE SPONGE 4X4 12PLY STRL (GAUZE/BANDAGES/DRESSINGS) ×3 IMPLANT
GLOVE BIO SURGEON STRL SZ7.5 (GLOVE) ×2 IMPLANT
GLOVE BIOGEL PI IND STRL 7.5 (GLOVE) ×4 IMPLANT
GLOVE BIOGEL PI INDICATOR 7.5 (GLOVE) ×2
GLOVE ECLIPSE 7.0 STRL STRAW (GLOVE) ×6 IMPLANT
GLOVE EXAM NITRILE XL STR (GLOVE) IMPLANT
GLOVE SURG UNDER POLY LF SZ7 (GLOVE) IMPLANT
GOWN STRL REUS W/ TWL LRG LVL3 (GOWN DISPOSABLE) ×4 IMPLANT
GOWN STRL REUS W/ TWL XL LVL3 (GOWN DISPOSABLE) IMPLANT
GOWN STRL REUS W/TWL 2XL LVL3 (GOWN DISPOSABLE) IMPLANT
GOWN STRL REUS W/TWL LRG LVL3 (GOWN DISPOSABLE) ×6
GOWN STRL REUS W/TWL XL LVL3 (GOWN DISPOSABLE)
HEMOSTAT POWDER KIT SURGIFOAM (HEMOSTASIS) ×3 IMPLANT
HEMOSTAT SURGICEL 2X14 (HEMOSTASIS) ×3 IMPLANT
HOOK DURA 1/2IN (MISCELLANEOUS) ×3 IMPLANT
IV NS 1000ML (IV SOLUTION) ×3
IV NS 1000ML BAXH (IV SOLUTION) ×2 IMPLANT
KIT BASIN OR (CUSTOM PROCEDURE TRAY) ×3 IMPLANT
KIT DRAIN CSF ACCUDRAIN (MISCELLANEOUS) IMPLANT
KIT TURNOVER KIT B (KITS) ×3 IMPLANT
KNIFE ARACHNOID DISP AM-24-S (MISCELLANEOUS) ×3 IMPLANT
MARKER SPHERE PSV REFLC 13MM (MARKER) ×6 IMPLANT
NDL SPNL 18GX3.5 QUINCKE PK (NEEDLE) IMPLANT
NEEDLE HYPO 22GX1.5 SAFETY (NEEDLE) ×3 IMPLANT
NEEDLE SPNL 18GX3.5 QUINCKE PK (NEEDLE) IMPLANT
NS IRRIG 1000ML POUR BTL (IV SOLUTION) ×9 IMPLANT
OIL CARTRIDGE MAESTRO DRILL (MISCELLANEOUS) ×3
PACK BATTERY CMF DISP FOR DVR (ORTHOPEDIC DISPOSABLE SUPPLIES) ×1 IMPLANT
PACK CRANIOTOMY CUSTOM (CUSTOM PROCEDURE TRAY) ×3 IMPLANT
PAD DRESSING TELFA 3X8 NADH (GAUZE/BANDAGES/DRESSINGS) IMPLANT
PATTIES SURGICAL .25X.25 (GAUZE/BANDAGES/DRESSINGS) IMPLANT
PATTIES SURGICAL .5 X.5 (GAUZE/BANDAGES/DRESSINGS) IMPLANT
PATTIES SURGICAL .5 X3 (DISPOSABLE) IMPLANT
PATTIES SURGICAL 1/4 X 3 (GAUZE/BANDAGES/DRESSINGS) IMPLANT
PATTIES SURGICAL 1X1 (DISPOSABLE) IMPLANT
PIN MAYFIELD SKULL DISP (PIN) ×3 IMPLANT
PLATE UNIV CMF 16 2H (Plate) ×3 IMPLANT
SCREW UNIII AXS SD 1.5X4 (Screw) ×6 IMPLANT
SET CARTRIDGE AND TUBING (SET/KITS/TRAYS/PACK) ×1 IMPLANT
SPECIMEN JAR SMALL (MISCELLANEOUS) IMPLANT
SPONGE NEURO XRAY DETECT 1X3 (DISPOSABLE) IMPLANT
SPONGE SURGIFOAM ABS GEL 100 (HEMOSTASIS) ×3 IMPLANT
STAPLER VISISTAT 35W (STAPLE) ×3 IMPLANT
STOCKINETTE 6  STRL (DRAPES)
STOCKINETTE 6 STRL (DRAPES) IMPLANT
SUT ETHILON 3 0 FSL (SUTURE) IMPLANT
SUT ETHILON 3 0 PS 1 (SUTURE) IMPLANT
SUT NURALON 4 0 TR CR/8 (SUTURE) ×7 IMPLANT
SUT SILK 0 TIES 10X30 (SUTURE) IMPLANT
SUT VIC AB 0 CT1 18XCR BRD8 (SUTURE) ×4 IMPLANT
SUT VIC AB 0 CT1 8-18 (SUTURE) ×6
SUT VIC AB 3-0 SH 8-18 (SUTURE) ×6 IMPLANT
TAPE CLOTH 1X10 TAN NS (GAUZE/BANDAGES/DRESSINGS) ×3 IMPLANT
TIP STANDARD 36KHZ (INSTRUMENTS) ×3
TIP STD 36KHZ (INSTRUMENTS) IMPLANT
TOWEL GREEN STERILE (TOWEL DISPOSABLE) ×3 IMPLANT
TOWEL GREEN STERILE FF (TOWEL DISPOSABLE) ×3 IMPLANT
TRAY FOLEY MTR SLVR 16FR STAT (SET/KITS/TRAYS/PACK) ×3 IMPLANT
TUBE CONNECTING 12X1/4 (SUCTIONS) ×3 IMPLANT
UNDERPAD 30X36 HEAVY ABSORB (UNDERPADS AND DIAPERS) ×3 IMPLANT
WATER STERILE IRR 1000ML POUR (IV SOLUTION) ×3 IMPLANT

## 2020-08-30 NOTE — Anesthesia Postprocedure Evaluation (Signed)
Anesthesia Post Note  Patient: [name] [name]  Procedure(s) Performed: Stereotactic left frontoparietal craniectomy for resection of tumor with brainlab (Left ) APPLICATION OF CRANIAL NAVIGATION (N/A )     Patient location during evaluation: NICU Anesthesia Type: General Level of consciousness: awake and oriented Pain management: pain level controlled Respiratory status: spontaneous breathing, respiratory function stable and nonlabored ventilation Cardiovascular status: blood pressure returned to baseline Postop Assessment: no headache Anesthetic complications: no   No complications documented.  Last Vitals:  Vitals:   08/30/20 1703 08/30/20 1717  BP: 110/78 (!) 123/98  Pulse: 80 81  Resp: 20 18  Temp: 36.4 C 36.4 C  SpO2: 97% 95%    Last Pain:  Vitals:   08/30/20 1717  TempSrc:   PainSc: 0-No pain   Pain Goal: Patients Stated Pain Goal: 2 (08/29/20 2115)                 Dondre Catalfamo COKER

## 2020-08-30 NOTE — Progress Notes (Signed)
Pt off unit to OR. Report called to [facility] on short stay. Consent obtained, CHG bath and new gown given, no personal belongings or jewelry on pt. Pt's sister took pt's cell phone home. Pre-procedure checklist completed.   Justice Rocher, RN

## 2020-08-30 NOTE — Op Note (Signed)
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 Tyler Pita, MD  TECHNIQUE:  The patient underwent a radiation treatment planning session in the radiation oncology simulation suite under the care of the radiation oncology physician and physicist.  I participated closely in the radiation treatment planning afterwards. The patient underwent planning CT.  We contoured the gross target volumes and subsequently expanded this to yield the Planning Target Volume. I actively participated in the planning process.  I helped to define and review the target contours and also the contours of the optic pathway, eyes, brainstem and selected nearby organs at risk.  All the dose constraints for critical structures were reviewed and compared to AAPM Task Group 101.  The prescription dose conformity was reviewed.  I approved the plan electronically.    Accordingly, [name] [name]  was brought to the TrueBeam stereotactic radiation treatment linac and placed in the custom immobilization mask.  The patient was aligned according to the IR fiducial markers with BrainLab Exactrac, then orthogonal x-rays were used in ExacTrac with the 6DOF robotic table and the shifts were made to align the patient  [name] [name] received stereotactic radiosurgery to a prescription dose of 18Gy to the left parietal lesion in a single fraction uneventfully.    The detailed description of the procedure is recorded in the radiation oncology procedure note.  I was present for the duration of the procedure.  DISPOSITION:   Following delivery, the patient was transported to nursing in stable condition and monitored for possible acute effects to be discharged  to [facility] for planned surgical resection later this afternoon.  Consuella Lose, MD [facility]

## 2020-08-30 NOTE — Progress Notes (Signed)
Pt back to unit from [facility].   Justice Rocher, RN

## 2020-08-30 NOTE — Anesthesia Preprocedure Evaluation (Signed)
Anesthesia Evaluation  Patient identified by MRN, date of birth, ID band Patient awake    Reviewed: Allergy & Precautions, NPO status , Patient's Chart, lab work & pertinent test results  Airway Mallampati: II  TM Distance: >3 FB Neck ROM: Full    Dental  (+) Teeth Intact, Dental Advisory Given   Pulmonary former smoker,    breath sounds clear to auscultation       Cardiovascular hypertension,  Rhythm:Regular Rate:Normal     Neuro/Psych    GI/Hepatic   Endo/Other    Renal/GU      Musculoskeletal   Abdominal   Peds  Hematology   Anesthesia Other Findings R. Sided weakness.  Reproductive/Obstetrics                             Anesthesia Physical Anesthesia Plan  ASA: III  Anesthesia Plan: General   Post-op Pain Management:    Induction: Intravenous  PONV Risk Score and Plan: Ondansetron and Dexamethasone  Airway Management Planned: Oral ETT  Additional Equipment: Arterial line and CVP  Intra-op Plan:   Post-operative Plan:   Informed Consent: I have reviewed the patients History and Physical, chart, labs and discussed the procedure including the risks, benefits and alternatives for the proposed anesthesia with the patient or authorized representative who has indicated his/her understanding and acceptance.     Dental advisory given  Plan Discussed with: CRNA and Anesthesiologist  Anesthesia Plan Comments:         Anesthesia Quick Evaluation

## 2020-08-30 NOTE — Progress Notes (Signed)
Anesthesiology Note:  L. Subclavian central line inserted that was in OR seen on CXR to be malpositioned and entering L. internal jugular vein. Since patient is stable and has a functioning peripheral IV, decision made to D/C central line. Line removed without difficulty, dressing applied.  Roberts Gaudy, MD

## 2020-08-30 NOTE — Progress Notes (Signed)
PROGRESS NOTE Brief same day note  [name] [name]  CZY:[id number] DOB: 01/17/1956 DOA: 08/22/2020 PCP: [name], NP   Chief Complain: Right arm/right leg numbness/weakness  Brief Narrative: Patient is a 65 year old male with history of COPD, non-small cell lung cancer status post chemoradiation, now on immunotherapy, active smoker who presented to the emergency department with further evaluation of right-sided weakness.  On presentation, he was hemodynamically stable.  Noncontrast head CT showed new 3.1 cm mass lesion involving the left posterior parietal convexity with large area of surrounding vasogenic edema and associated mass-effect.  CT with contrast confirmed 3.2 x 2.2 x 3 cm intra-axial mass lesion consistent with metastasis.  Started on Keppra, Decadron. Oncology,neurooncology and neurosurgery consulted,planning for  preop stereotactic radiosurgery followed by surgical resection/cranitomy on  4/14  PT recommending inpatient rehab on discharge.   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  CT with contrast confirmed 3.2 x 2.2 x 3 cm intra-axial mass lesion consistent with metastasis.    Getting preop stereotactic radiosurgery followed by surgical resection/cranitomy on  4/14 Continue Decadron, also on Keppra 500 mg twice daily.. Currently alert and oriented but was confused on presentation. He has mild weakness on the right side Will follow Oncology,neurooncology and neurosurgery recommendation  Non-small cell lung cancer: Follows with Dr. Julien Nordmann, status post chemoradiation, now on immunotherapy with Imfinzi (anti PD1)  COPD: Currently stable, not in exacerbation.  Continue bronchodilators.  On room air.  Right-sided weakness: PT recommended  CIR on discharge.         DVT prophylaxis:Lovenox Code Status: Full Family Communication: talked to [facility] Ms [name]  at bedside on 4/14 Status is: Inpatient   Dispo: The patient is from: Home              Anticipated d/c is to: CIR              Patient currently is not medically stable to d/c.   Difficult to place patient No     Consultants: neurosurgery/radiation oncology  Procedures:  S/p Stereotactic left frontoparietal craniotomy for resection of tumor on 4/14 by Dr Kathyrn Sheriff   Antimicrobials:  Anti-infectives (From admission, onward)   Start     Dose/Rate Route Frequency Ordered Stop   08/30/20 1800  ceFAZolin (ANCEF) IVPB 2g/100 mL premix        2 g 200 mL/hr over 30 Minutes Intravenous Every 8 hours 08/30/20 1643 08/31/20 0959   08/30/20 1315  ceFAZolin (ANCEF) IVPB 2g/100 mL premix  Status:  Discontinued        2 g 200 mL/hr over 30 Minutes Intravenous To Surgery 08/30/20 1249 08/30/20 1746      Subjective: Patient seen and examined the bedside after returned from surgery   Hemodynamically stable.  Comfortable.  Denies pain  States it is  March but able to self-correct. Friends at bedside  Objective: Vitals:   08/30/20 0845 08/30/20 1247 08/30/20 1703 08/30/20 1717  BP:  127/83 110/78 (!) 123/98  Pulse:  94 80 81  Resp:  14 20 18   Temp:  97.8 F (36.6 C) 97.6 F (36.4 C) 97.6 F (36.4 C)  TempSrc:  Oral    SpO2: 97% 96% 97% 95%  Weight:      Height:  Intake/Output Summary (Last 24 hours) at 08/30/2020 1833 Last data filed at 08/30/2020 1707 Gross per 24 hour  Intake 1800 ml  Output 250 ml  Net 1550 ml   Filed Weights   08/23/20 0552  Weight: 56.3 kg    Examination:  General exam: Overall comfortable, not in distress, pleasant, aaox3 HEENT: PERRL Respiratory system:  no wheezes or crackles  Cardiovascular system: S1 & S2 heard, RRR.  Gastrointestinal system: Abdomen is nondistended, soft and nontender. Central nervous  system: Alert and oriented, mild weakness on the right side with motor of 4+/5 on the right upper and right lower extremity Extremities: No edema, no clubbing ,no cyanosis Skin: No rashes, no ulcers,no icterus   Data Reviewed: I have personally reviewed following labs and imaging studies  CBC: Recent Labs  Lab 08/24/20 0024 08/25/20 0223  WBC 5.4 8.2  HGB 12.4* 12.0*  HCT 38.2* 37.4*  MCV 78.4* 80.8  PLT 273 960   Basic Metabolic Panel: Recent Labs  Lab 08/24/20 0024 08/25/20 0223  NA 135 138  K 4.1 4.2  CL 103 107  CO2 21* 25  GLUCOSE 160* 127*  BUN 8 16  CREATININE 1.02 0.99  CALCIUM 9.3 9.3   GFR: Estimated Creatinine Clearance: 56.5 mL/min (by C-G formula based on SCr of 0.99 mg/dL). Liver Function Tests: No results for input(s): AST, ALT, ALKPHOS, BILITOT, PROT, ALBUMIN in the last 168 hours. No results for input(s): LIPASE, AMYLASE in the last 168 hours. No results for input(s): AMMONIA in the last 168 hours. Coagulation Profile: No results for input(s): INR, PROTIME in the last 168 hours. Cardiac Enzymes: No results for input(s): CKTOTAL, CKMB, CKMBINDEX, TROPONINI in the last 168 hours. BNP (last 3 results) No results for input(s): PROBNP in the last 8760 hours. HbA1C: No results for input(s): HGBA1C in the last 72 hours. CBG: No results for input(s): GLUCAP in the last 168 hours. Lipid Profile: No results for input(s): CHOL, HDL, LDLCALC, TRIG, CHOLHDL, LDLDIRECT in the last 72 hours. Thyroid Function Tests: No results for input(s): TSH, T4TOTAL, FREET4, T3FREE, THYROIDAB in the last 72 hours. Anemia Panel: No results for input(s): VITAMINB12, FOLATE, FERRITIN, TIBC, IRON, RETICCTPCT in the last 72 hours. Sepsis Labs: No results for input(s): PROCALCITON, LATICACIDVEN in the last 168 hours.  Recent Results (from the past 240 hour(s))  Resp Panel by RT-PCR (Flu A&B, Covid) Nasopharyngeal Swab     Status: None   Collection Time: 08/23/20 12:10 AM    Specimen: Nasopharyngeal Swab; Nasopharyngeal(NP) swabs in vial transport medium  Result Value Ref Range Status   SARS Coronavirus 2 by RT PCR NEGATIVE NEGATIVE Final    Comment: (NOTE) SARS-CoV-2 target nucleic acids are NOT DETECTED.  The SARS-CoV-2 RNA is generally detectable in upper respiratory specimens during the acute phase of infection. The lowest concentration of SARS-CoV-2 viral copies this assay can detect is 138 copies/mL. A negative result does not preclude SARS-Cov-2 infection and should not be used as the sole basis for treatment or other patient management decisions. A negative result may occur with  improper specimen collection/handling, submission of specimen other than nasopharyngeal swab, presence of viral mutation(s) within the areas targeted by this assay, and inadequate number of viral copies(<138 copies/mL). A negative result must be combined with clinical observations, patient history, and epidemiological information. The expected result is Negative.  Fact Sheet for Patients:  EntrepreneurPulse.com.au  Fact Sheet for Healthcare Providers:  IncredibleEmployment.be  This test is no t yet approved or cleared by the  [place] Architectural technologist and  has been authorized for detection and/or diagnosis of SARS-CoV-2 by FDA under an Print production planner (EUA). This EUA will remain  in effect (meaning this test can be used) for the duration of the COVID-19 declaration under Section 564(b)(1) of the Act, 21 U.S.C.section 360bbb-3(b)(1), unless the authorization is terminated  or revoked sooner.       Influenza A by PCR NEGATIVE NEGATIVE Final   Influenza B by PCR NEGATIVE NEGATIVE Final    Comment: (NOTE) The Xpert Xpress SARS-CoV-2/FLU/RSV plus assay is intended as an aid in the diagnosis of influenza from Nasopharyngeal swab specimens and should not be used as a sole basis for treatment. Nasal washings and aspirates are  unacceptable for Xpert Xpress SARS-CoV-2/FLU/RSV testing.  Fact Sheet for Patients: EntrepreneurPulse.com.au  Fact Sheet for Healthcare Providers: IncredibleEmployment.be  This test is not yet approved or cleared by the [place] FDA and has been authorized for detection and/or diagnosis of SARS-CoV-2 by FDA under an Emergency Use Authorization (EUA). This EUA will remain in effect (meaning this test can be used) for the duration of the COVID-19 declaration under Section 564(b)(1) of the Act, 21 U.S.C. section 360bbb-3(b)(1), unless the authorization is terminated or revoked.  Performed at [facility], [place] [address], [place], [place] [address]   Surgical PCR screen     Status: None   Collection Time: 08/23/20  4:28 AM   Specimen: Nasal Mucosa; Nasal Swab  Result Value Ref Range Status   MRSA, PCR NEGATIVE NEGATIVE Final   Staphylococcus aureus NEGATIVE NEGATIVE Final    Comment: (NOTE) The Xpert SA Assay (FDA approved for NASAL specimens in patients 44 years of age and older), is one component of a comprehensive surveillance program. It is not intended to diagnose infection nor to guide or monitor treatment. Performed at [facility], [place] [address] [place], [place] [address]          Radiology Studies: X-ray chest PA or AP  Result Date: 08/30/2020 CLINICAL DATA:  Central line placement EXAM: CHEST  1 VIEW COMPARISON:  08/23/2020 FINDINGS: The heart size and mediastinal contours are within normal limits. There is a left chest vascular catheter, tip directed into the internal jugular and not imaged at the superior extent of the exam. Bullous change of the left apex, unchanged. The visualized skeletal structures are unremarkable. IMPRESSION: 1. There is a malpositioned left chest vascular catheter, tip directed into the internal jugular and not imaged at the superior extent of the exam. 2. No acute abnormality of the  lungs. These results will be called to the ordering clinician or representative by the Radiologist Assistant, and communication documented in the PACS or [employer]. Electronically Signed   By: Eddie Candle M.D.   On: 08/30/2020 17:40   CT HEAD W & WO CONTRAST  Result Date: 08/29/2020 CLINICAL DATA:  Initial evaluation for brain mass, preoperative planning. EXAM: CT HEAD WITHOUT AND WITH CONTRAST TECHNIQUE: Contiguous axial images were obtained from the base of the skull through the vertex without and with intravenous contrast CONTRAST:  74mL OMNIPAQUE IOHEXOL 300 MG/ML  SOLN COMPARISON:  Prior head CT from 08/23/2020 FINDINGS: Brain: Again seen is a focal intraparenchymal enhancing mass centered along the gray-white matter differentiation at the left parietal convexity. Lesion measures 3.1 x 1.9 x 3.3 cm (transverse by craniocaudad by AP). Surrounding vasogenic edema throughout the posterior left frontoparietal region without significant regional mass effect. No other mass lesion or abnormal enhancement.  Underlying age-related cerebral atrophy with mild chronic small vessel ischemic disease. Remote lacunar infarct present at the right caudate head. 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 Postsurgical encephalomalacia present at the anterior right temporal pole. No acute intracranial hemorrhage. No other acute large vessel territory infarct. No hydrocephalus or extra-axial fluid collection. Vascular: No hyperdense vessel on precontrast images. Normal intravascular enhancement seen following contrast administration. Prior surgical clipping of aneurysm near the right ICA terminus. Skull: Remote right pterional craniotomy. No focal osseous lesions. Scalp soft tissues demonstrate no acute finding. Sinuses/Orbits: Globes and orbital soft tissues within normal limits. Paranasal sinuses are largely clear. Small right mastoid effusion noted, of doubtful significance.  Other: None. IMPRESSION: 1. 3.1 x 1.9 x 3.3 cm enhancing mass centered along the gray-white matter differentiation at the left parietal convexity with surrounding vasogenic edema. A solitary intracranial metastasis is favored. A primary CNS neoplasm would be the primary differential consideration. 2. 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 3. 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 Electronically Signed   By: Jeannine Boga M.D.   On: 08/29/2020 00:07        Scheduled Meds: . Chlorhexidine Gluconate Cloth  6 each Topical Daily  . dexamethasone  4 mg Oral Q6H  . mometasone-formoterol  2 puff Inhalation BID  . pantoprazole (PROTONIX) IV  40 mg Intravenous QHS  . senna  1 tablet Oral BID  . umeclidinium bromide  1 puff Inhalation Daily   Continuous Infusions: . sodium chloride 75 mL/hr at 08/30/20 1802  .  ceFAZolin (ANCEF) IV    . levETIRAcetam       LOS: 6 days    Time spent: 15 mins.More than 50% of that time was spent in counseling and/or coordination of care.      Florencia Reasons, MD PhD FACP [facility] P4/14/2022, 6:33 PM

## 2020-08-30 NOTE — Transfer of Care (Signed)
Immediate Anesthesia Transfer of Care Note  Patient: [name] [name]  Procedure(s) Performed: Stereotactic left frontoparietal craniectomy for resection of tumor with brainlab (Left ) APPLICATION OF CRANIAL NAVIGATION (N/A )  Patient Location: PACU  Anesthesia Type:General  Level of Consciousness: awake and alert   Airway & Oxygen Therapy: Patient Spontanous Breathing and Patient connected to nasal cannula oxygen  Post-op Assessment: Report given to RN and Post -op Vital signs reviewed and stable  Post vital signs: Reviewed and stable  Last Vitals:  Vitals Value Taken Time  BP 110/78 08/30/20 1703  Temp    Pulse 81 08/30/20 1706  Resp 14 08/30/20 1706  SpO2 97 % 08/30/20 1706  Vitals shown include unvalidated device data.  Last Pain:  Vitals:   08/30/20 1247  TempSrc: Oral  PainSc:       Patients Stated Pain Goal: 2 (01/41/03 0131)  Complications: No complications documented.

## 2020-08-30 NOTE — Progress Notes (Signed)
Radiology called-- malposition L chest vascular catheter tip directed into the IJ. Not imaged at the superior extent of the exam. No acute abnormality. Nundkumar and Estelle PA called with no answer-- Suanne Marker from radiology reported.

## 2020-08-30 NOTE — Progress Notes (Signed)
RN notified RT to come and assess pt. Artline. Pt. artline apparently became dislodged at the bag site per RN. RT attempted to replace the bag but the line malfunctioned. Line would not draw back and flush properly and line was also positional. Line was pulled and and RN placed dressing on site. No issues at this time.

## 2020-08-30 NOTE — Progress Notes (Signed)
OT Cancellation Note  Patient Details Name: [name] [name] MRN: [id number] DOB: 1955/07/09   Cancelled Treatment:    Reason Eval/Treat Not Completed: Patient at procedure or test/ unavailable (scheduled for surgery today) Will follow up later date.  Ramond Dial, OT/L   Acute OT Clinical Specialist Acute Rehabilitation Services Pager [phone] Office [phone]  08/30/2020, 7:01 AM

## 2020-08-30 NOTE — Anesthesia Procedure Notes (Signed)
Central Venous Catheter Insertion Performed by: Roberts Gaudy, MD, anesthesiologist Start/End4/14/2022 2:35 PM, 08/30/2020 2:40 PM Patient location: Pre-op. Preanesthetic checklist: patient identified, IV checked, site marked, risks and benefits discussed, surgical consent, monitors and equipment checked, pre-op evaluation, timeout performed and anesthesia consent Position: Trendelenburg Lidocaine 1% used for infiltration and patient sedated Hand hygiene performed  and maximum sterile barriers used  Catheter size: 8 Fr Total catheter length 16. Central line was placed.Double lumen Procedure performed using ultrasound guided technique. Ultrasound Notes:image(s) printed for medical record Attempts: 1 Following insertion, dressing applied and line sutured. Post procedure assessment: blood return through all ports  Patient tolerated the procedure well with no immediate complications.

## 2020-08-30 NOTE — Progress Notes (Addendum)
  Radiation Oncology         [phone] ________________________________  INPATIENT  Stereotactic Treatment Procedure Note  Name: [name] [name] MRN: [id number]  Date: 08/30/2020  DOB: Mar 29, 1956  SPECIAL TREATMENT PROCEDURE    ICD-10-CM   1. Brain metastasis ([place])  C79.31     3D TREATMENT PLANNING AND DOSIMETRY:  The patient's radiation plan was reviewed and approved by neurosurgery and radiation oncology prior to treatment.  It showed 3-dimensional radiation distributions overlaid onto the planning CT/MRI image set.  [facility] for the target structures as well as the organs at risk were reviewed. The documentation of the 3D plan and dosimetry are filed in the radiation oncology EMR.  NARRATIVE:  [name] [name] was brought to the TrueBeam stereotactic radiation treatment machine and placed supine on the CT couch. The head frame was applied, and the patient was set up for stereotactic radiosurgery.  Neurosurgery was present for the set-up and delivery  SIMULATION VERIFICATION:  In the couch zero-angle position, the patient underwent Exactrac imaging using the Brainlab system with orthogonal KV images.  These were carefully aligned and repeated to confirm treatment position for each of the isocenters.  The Exactrac snap film verification was repeated at each couch angle.  PROCEDURE: [name] [name] received stereotactic radiosurgery to the following targets: Left 3.2 cm parietal vertex target was treated using 4 Rapid Arc VMAT Beams to a prescription dose of 18 Gy.  ExacTrac registration was performed for each couch angle.  The 100% isodose line was prescribed.  6 MV X-rays were delivered in the flattening filter free beam mode.  STEREOTACTIC TREATMENT MANAGEMENT:  Following delivery, the patient was transported to nursing in stable condition and monitored for possible acute effects.  Vital signs were recorded BP (!) 142/86 (BP Location: Left Arm, Patient Position: Sitting, Cuff Size:  Normal)   Pulse 85   Temp (!) 97.5 F (36.4 C)   Resp 18   SpO2 96% . The patient tolerated treatment without significant acute effects, and was discharged to home in stable condition.    PLAN: Planned craniotomy and resection tomorrow and then follow-up in one month.  ________________________________  Sheral Apley. Tammi Klippel, M.D.

## 2020-08-30 NOTE — Anesthesia Procedure Notes (Signed)
Arterial Line Insertion Start/End4/14/2022 2:01 PM Performed by: Eligha Bridegroom, CRNA, CRNA  radial was placed Catheter size: 20 G Hand hygiene performed  and maximum sterile barriers used   Attempts: 1 Procedure performed without using ultrasound guided technique. Following insertion, dressing applied and Biopatch. Post procedure assessment: normal  Patient tolerated the procedure well with no immediate complications.

## 2020-08-30 NOTE — Progress Notes (Signed)
  NEUROSURGERY PROGRESS NOTE   No issues overnight. No new complaints.  EXAM:  BP 127/83 (BP Location: Right Arm)   Pulse 94   Temp 97.8 F (36.6 C) (Oral)   Resp 14   Ht 5\' 1"  (1.549 m)   Wt 56.3 kg   SpO2 96%   BMI 23.45 kg/m   Awake, alert, oriented  Speech fluent, appropriate  CN grossly intact  5/5 LUE/LLE 4/5 RUE/RLE  IMPRESSION:  65 y.o. male with single large left post-central gyrus metastatic lesion requiring resection. He underwent preoperative SRS treatment to 18Gy this am.  PLAN: - Will proceed with stereotactic left frontoparietal craniotomy for resection of tumor as planned.  I have again reviewed the surgery, inherent risks, and expected postop course with the patient. We discussed the possibility of worsening right-sided weakness/numbness postop. All his questions today were answered. He provided verbal and written consent to proceed. He asked that we speak with his church congregant, Mrs. [name] after the surgery.  Consuella Lose, MD [facility]

## 2020-08-30 NOTE — Anesthesia Procedure Notes (Signed)
Procedure Name: Intubation Date/Time: 08/30/2020 2:28 PM Performed by: Eligha Bridegroom, CRNA Pre-anesthesia Checklist: Patient identified, Emergency Drugs available, Suction available, Patient being monitored and Timeout performed Patient Re-evaluated:Patient Re-evaluated prior to induction Oxygen Delivery Method: Circle system utilized Preoxygenation: Pre-oxygenation with 100% oxygen Induction Type: IV induction Ventilation: Mask ventilation without difficulty Laryngoscope Size: Mac and 4 Grade View: Grade I Tube type: Oral Tube size: 7.5 mm Number of attempts: 1 Airway Equipment and Method: Stylet Placement Confirmation: ETT inserted through vocal cords under direct vision,  positive ETCO2 and breath sounds checked- equal and bilateral Secured at: 21 cm Tube secured with: Tape Dental Injury: Teeth and Oropharynx as per pre-operative assessment

## 2020-08-30 NOTE — Progress Notes (Signed)
Pt off unit to [facility] for radiation. Report given to [facility].  Justice Rocher, RN

## 2020-08-30 NOTE — Op Note (Signed)
NEUROSURGERY OPERATIVE NOTE   PREOP DIAGNOSIS:  1. Metastatic lung cancer to brain   POSTOP DIAGNOSIS: Same  PROCEDURE: 1. Stereotactic left frontoparietal craniotomy for resection of tumor 2. Use of intraoperative microscope for microdissection  SURGEON: Dr. Consuella Lose, MD  ASSISTANT: Ferne Reus, PA-C  ANESTHESIA: General Endotracheal  EBL: 50cc  SPECIMENS: Left parietal tumor for permanent pathology  DRAINS: None  COMPLICATIONS: None immediate  CONDITION: Hemodynamically stable to PACU  HISTORY: [name] [name] is a 65 y.o. male initially presented to the hospital with subjective right-sided weakness, clumsiness, and difficulty walking.  He has a history of non-small cell lung cancer status post chemotherapy and radiation.  He also has a history of right-sided craniotomy for possible AVM or aneurysm treatment several decades ago with implanted clips and is therefore unable to undergo MRI.  He therefore underwent CT scan of the brain with contrast demonstrating a enhancing lesion in the left frontoparietal region.  This was felt to be just posterior to the central sulcus.  Patient therefore underwent preoperative stereotactic radiosurgery to the lesion to a prescription dose of 18 Gray earlier today and presents now for surgical resection of the lesion.  The risks, benefits, and alternative treatments were all reviewed in detail with the patient as well as the expected postoperative course and recovery.  After all his questions were answered informed consent was obtained and witnessed.  PROCEDURE IN DETAIL: The patient was brought to the operating room. After induction of general anesthesia, the patient was positioned on the operative table in the [place] head holder in the supine position. All pressure points were meticulously padded.  Using the preoperative stereotactic CT scan, surface markers were coregistered and an excellent accuracy was achieved.  Stereotactic  system was then used to mark out the surface projection of the tumor as well as the more medial superior sagittal sinus.  Curvilinear skin incision was then marked out.  This was then prepped and draped in the usual sterile fashion.  After timeout was conducted, the incision was infiltrated with local anesthetic with epinephrine.  Incision was then made sharply and carried down through the galea.  Raney clips were applied.  Periosteum was elevated.  Self-retaining retractors were placed.  Stereotactic system was used to identify the underlying superior sagittal sinus and the borders of the tumor.  This was used to plan out a craniotomy to allow access to the tumor while avoiding the superior sagittal sinus.  Multiple bur holes were created and connected with the craniotome.  Single piece frontal parietal flap was then elevated.  Hemostasis was secured on the epidural plane with bipolar.  Dura was then opened in semilunar fashion based medially and tacked up with 4 Nurolon stitches.  Stereotactic system was again used to identify the tumor.  Stereotactic system appeared to correlate with what we are seeing visually, with what appeared to be the subcentral sulcus and just posterior to this a somewhat enlarged gyrus containing the tumor.  At this point we draped the microscope and brought it into the field.  The tumor resection was then performed under the microscope using microdissection technique.  Initially, corticectomy was made on the posterior aspect of the tumor after coagulation of the pia with the bipolar.  Ultrasonic aspirator and the bipolar were then used to dissect the tumor away from the white matter.  Tumor was noted to be somewhat firm in consistency and slightly blue or purple in color.  The surrounding white matter was actually noted to be  somewhat more "stringy" than what I would consider normal.  Given the location around the central sulcus and the primary sensorimotor area, I elected not to  further resect this portion of the white matter surrounding the tumor proper.  In this way, the ultrasonic aspirator and bipolar were used to dissect the tumor away from the surrounding white matter initially in the posterior plane, followed by the lateral, medial, and inferior borders.  Again, because of the location and the sensorimotor cortex, I elected to truncate the tumor anteriorly in order to minimize dissection of the white matter anterior to the tumor.  This portion of the tumor was sent for permanent pathology.  Attention was then turned to the remainder of the tumor anteriorly which was carefully dissected away from the white matter primarily by internal debulking until the tumor was removed, and the more anterior white matter remained intact.  The resection cavity was then inspected and no further gross tumor was identified.  Resection cavity was also checked with the stereotactic system.  At this point the wound was irrigated with normal saline.  Hemostasis on the resection cavity was achieved easily with morselized Gelfoam with thrombin.  The dura was then reapproximated with interrupted 4-0 Nurolon stitches.  Small sheet of Gelfoam was placed over the dura and the bone flap was replaced with standard titanium plates and screws.  [place] was then reapproximated with interrupted 3-0 Vicryl stitches and the skin was closed with staples.  Bacitracin ointment and sterile dressing was applied.  Patient was removed from the [place] head holder.  At the end of the case all sponge, needle, instrument, and cottonoid counts are correct.  Patient was then transferred to the stretcher, extubated, and taken to the postanesthesia care unit in stable hemodynamic condition.   Consuella Lose, MD [facility]

## 2020-08-31 ENCOUNTER — Encounter (HOSPITAL_COMMUNITY): Payer: Self-pay | Admitting: Neurosurgery

## 2020-08-31 LAB — BASIC METABOLIC PANEL
Anion gap: 6 (ref 5–15)
BUN: 14 mg/dL (ref 8–23)
CO2: 22 mmol/L (ref 22–32)
Calcium: 8.4 mg/dL — ABNORMAL LOW (ref 8.9–10.3)
Chloride: 104 mmol/L (ref 98–111)
Creatinine, Ser: 0.9 mg/dL (ref 0.61–1.24)
GFR, Estimated: >60 mL/min (ref >60)
Glucose, Bld: 109 mg/dL — ABNORMAL HIGH (ref 70–99)
Potassium: 4.1 mmol/L (ref 3.5–5.1)
Sodium: 132 mmol/L — ABNORMAL LOW (ref 135–145)

## 2020-08-31 LAB — CBC
HCT: 37 % — ABNORMAL LOW (ref 39.0–52.0)
Hemoglobin: 11.9 g/dL — ABNORMAL LOW (ref 13.0–17.0)
MCH: 26 pg (ref 26.0–34.0)
MCHC: 32.2 g/dL (ref 30.0–36.0)
MCV: 81 fL (ref 80.0–100.0)
Platelets: 236 10*3/uL (ref 150–400)
RBC: 4.57 MIL/uL (ref 4.22–5.81)
RDW: 15.8 % — ABNORMAL HIGH (ref 11.5–15.5)
WBC: 12 10*3/uL — ABNORMAL HIGH (ref 4.0–10.5)
nRBC: 0 % (ref 0.0–0.2)

## 2020-08-31 MED FILL — Thrombin For Soln Kit 20000 Unit: CUTANEOUS | Qty: 1 | Status: AC

## 2020-08-31 NOTE — Progress Notes (Signed)
PROGRESS NOTE Brief same day note  Neftaly Swiss  KKX:[id number] DOB: [birth year] DOA: 08/22/2020 PCP: [name], NP   Chief Complain: Right arm/right leg numbness/weakness  Brief Narrative: Patient is a 65 year old male with history of COPD, non-small cell lung cancer status post chemoradiation, now on immunotherapy, active smoker who presented to the emergency department with further evaluation of right-sided weakness.  On presentation, he was hemodynamically stable.  Noncontrast head CT showed new 3.1 cm mass lesion involving the left posterior parietal convexity with large area of surrounding vasogenic edema and associated mass-effect.  CT with contrast confirmed 3.2 x 2.2 x 3 cm intra-axial mass lesion consistent with metastasis.  Started on Keppra, Decadron. Oncology,neurooncology and neurosurgery consulted,planning for  preop stereotactic radiosurgery followed by surgical resection/cranitomy on  4/14  PT recommending inpatient rehab on discharge.   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  CT with contrast confirmed 3.2 x 2.2 x 3 cm intra-axial mass lesion consistent with metastasis.    Getting preop stereotactic radiosurgery followed by surgical resection/cranitomy on  4/14 Continue Decadron, also on Keppra 500 mg twice daily.. Currently alert and oriented but was confused on presentation. He has  weakness on the right side Will follow Oncology,neurooncology and neurosurgery recommendation  Non-small cell lung cancer: Follows with Dr. Julien Nordmann, status post chemoradiation, now on immunotherapy with Imfinzi (anti PD1)  COPD: Currently stable, not in exacerbation.  Continue bronchodilators.  On room air.  Right-sided weakness: PT recommended CIR  on discharge.         DVT prophylaxis:Lovenox Code Status: Full Family Communication: talked to [facility] Ms [name]  at bedside on 4/14 Status is: Inpatient   Dispo: The patient is from: Home              Anticipated d/c is to: CIR              Patient currently is not medically stable to d/c.   Difficult to place patient No     Consultants: neurosurgery/radiation oncology  Procedures:  S/p Stereotactic left frontoparietal craniotomy for resection of tumor on 4/14 by Dr Kathyrn Sheriff   Antimicrobials:  Anti-infectives (From admission, onward)   Start     Dose/Rate Route Frequency Ordered Stop   08/30/20 1800  ceFAZolin (ANCEF) IVPB 2g/100 mL premix        2 g 200 mL/hr over 30 Minutes Intravenous Every 8 hours 08/30/20 1643 08/31/20 0312   08/30/20 1315  ceFAZolin (ANCEF) IVPB 2g/100 mL premix  Status:  Discontinued        2 g 200 mL/hr over 30 Minutes Intravenous To Surgery 08/30/20 1249 08/30/20 1746      Subjective: He is sitting up in chair, reports some headache, no nausea no vomiting   Hemodynamically stable.   States it is  March but able to self-correct.   Objective: Vitals:   08/31/20 1300 08/31/20 1400 08/31/20 1500 08/31/20 1600  BP: 104/68 102/62 102/62 122/67  Pulse: (!) 103 98 (!) 104 94  Resp: (!) 28 (!) 24 20 (!) 25  Temp:      TempSrc:      SpO2: 99% 97% 96% 97%  Weight:      Height:        Intake/Output Summary (Last 24 hours)  at 08/31/2020 1618 Last data filed at 08/31/2020 1400 Gross per 24 hour  Intake 3814.4 ml  Output 1465 ml  Net 2349.4 ml   Filed Weights   08/23/20 0552  Weight: 56.3 kg    Examination:  General exam: Overall comfortable, not in distress, pleasant, aaox3, dressing on scalp HEENT: PERRL Respiratory system:  no wheezes or crackles  Cardiovascular system: S1 & S2 heard, RRR.  Gastrointestinal system: Abdomen is nondistended, soft and nontender. Central nervous system: Alert and oriented, weakness on the right  side with motor of 4+/5 on the right upper and right lower extremity Extremities: No edema, no clubbing ,no cyanosis Skin: No rashes, no ulcers,no icterus   Data Reviewed: I have personally reviewed following labs and imaging studies  CBC: Recent Labs  Lab 08/25/20 0223 08/31/20 0310  WBC 8.2 12.0*  HGB 12.0* 11.9*  HCT 37.4* 37.0*  MCV 80.8 81.0  PLT 293 440   Basic Metabolic Panel: Recent Labs  Lab 08/25/20 0223 08/31/20 0310  NA 138 132*  K 4.2 4.1  CL 107 104  CO2 25 22  GLUCOSE 127* 109*  BUN 16 14  CREATININE 0.99 0.90  CALCIUM 9.3 8.4*   GFR: Estimated Creatinine Clearance: 62.1 mL/min (by C-G formula based on SCr of 0.9 mg/dL). Liver Function Tests: No results for input(s): AST, ALT, ALKPHOS, BILITOT, PROT, ALBUMIN in the last 168 hours. No results for input(s): LIPASE, AMYLASE in the last 168 hours. No results for input(s): AMMONIA in the last 168 hours. Coagulation Profile: No results for input(s): INR, PROTIME in the last 168 hours. Cardiac Enzymes: No results for input(s): CKTOTAL, CKMB, CKMBINDEX, TROPONINI in the last 168 hours. BNP (last 3 results) No results for input(s): PROBNP in the last 8760 hours. HbA1C: No results for input(s): HGBA1C in the last 72 hours. CBG: No results for input(s): GLUCAP in the last 168 hours. Lipid Profile: No results for input(s): CHOL, HDL, LDLCALC, TRIG, CHOLHDL, LDLDIRECT in the last 72 hours. Thyroid Function Tests: No results for input(s): TSH, T4TOTAL, FREET4, T3FREE, THYROIDAB in the last 72 hours. Anemia Panel: No results for input(s): VITAMINB12, FOLATE, FERRITIN, TIBC, IRON, RETICCTPCT in the last 72 hours. Sepsis Labs: No results for input(s): PROCALCITON, LATICACIDVEN in the last 168 hours.  Recent Results (from the past 240 hour(s))  Resp Panel by RT-PCR (Flu A&B, Covid) Nasopharyngeal Swab     Status: None   Collection Time: 08/23/20 12:10 AM   Specimen: Nasopharyngeal Swab; Nasopharyngeal(NP) swabs  in vial transport medium  Result Value Ref Range Status   SARS Coronavirus 2 by RT PCR NEGATIVE NEGATIVE Final    Comment: (NOTE) SARS-CoV-2 target nucleic acids are NOT DETECTED.  The SARS-CoV-2 RNA is generally detectable in upper respiratory specimens during the acute phase of infection. The lowest concentration of SARS-CoV-2 viral copies this assay can detect is 138 copies/mL. A negative result does not preclude SARS-Cov-2 infection and should not be used as the sole basis for treatment or other patient management decisions. A negative result may occur with  improper specimen collection/handling, submission of specimen other than nasopharyngeal swab, presence of viral mutation(s) within the areas targeted by this assay, and inadequate number of viral copies(<138 copies/mL). A negative result must be combined with clinical observations, patient history, and epidemiological information. The expected result is Negative.  Fact Sheet for Patients:  EntrepreneurPulse.com.au  Fact Sheet for Healthcare Providers:  IncredibleEmployment.be  This test is no t yet approved or cleared by the [place] FDA  and  has been authorized for detection and/or diagnosis of SARS-CoV-2 by FDA under an Emergency Use Authorization (EUA). This EUA will remain  in effect (meaning this test can be used) for the duration of the COVID-19 declaration under Section 564(b)(1) of the Act, 21 U.S.C.section 360bbb-3(b)(1), unless the authorization is terminated  or revoked sooner.       Influenza A by PCR NEGATIVE NEGATIVE Final   Influenza B by PCR NEGATIVE NEGATIVE Final    Comment: (NOTE) The Xpert Xpress SARS-CoV-2/FLU/RSV plus assay is intended as an aid in the diagnosis of influenza from Nasopharyngeal swab specimens and should not be used as a sole basis for treatment. Nasal washings and aspirates are unacceptable for Xpert Xpress  SARS-CoV-2/FLU/RSV testing.  Fact Sheet for Patients: EntrepreneurPulse.com.au  Fact Sheet for Healthcare Providers: IncredibleEmployment.be  This test is not yet approved or cleared by the [place] FDA and has been authorized for detection and/or diagnosis of SARS-CoV-2 by FDA under an Emergency Use Authorization (EUA). This EUA will remain in effect (meaning this test can be used) for the duration of the COVID-19 declaration under Section 564(b)(1) of the Act, 21 U.S.C. section 360bbb-3(b)(1), unless the authorization is terminated or revoked.  Performed at [facility], [place] [address] [place], [place] [address]   Surgical PCR screen     Status: None   Collection Time: 08/23/20  4:28 AM   Specimen: Nasal Mucosa; Nasal Swab  Result Value Ref Range Status   MRSA, PCR NEGATIVE NEGATIVE Final   Staphylococcus aureus NEGATIVE NEGATIVE Final    Comment: (NOTE) The Xpert SA Assay (FDA approved for NASAL specimens in patients 20 years of age and older), is one component of a comprehensive surveillance program. It is not intended to diagnose infection nor to guide or monitor treatment. Performed at [facility], [place] [address], [place], [place] [address]          Radiology Studies: X-ray chest PA or AP  Result Date: 08/30/2020 CLINICAL DATA:  Central line placement EXAM: CHEST  1 VIEW COMPARISON:  08/23/2020 FINDINGS: The heart size and mediastinal contours are within normal limits. There is a left chest vascular catheter, tip directed into the internal jugular and not imaged at the superior extent of the exam. Bullous change of the left apex, unchanged. The visualized skeletal structures are unremarkable. IMPRESSION: 1. There is a malpositioned left chest vascular catheter, tip directed into the internal jugular and not imaged at the superior extent of the exam. 2. No acute abnormality of the lungs. These results will be called  to the ordering clinician or representative by the Radiologist Assistant, and communication documented in the PACS or [employer]. Electronically Signed   By: Eddie Candle M.D.   On: 08/30/2020 17:40   CT HEAD W & WO CONTRAST  Result Date: 08/30/2020 CLINICAL DATA:  Postop craniotomy EXAM: CT HEAD WITHOUT AND WITH CONTRAST TECHNIQUE: Contiguous axial images were obtained from the base of the skull through the vertex without and with intravenous contrast CONTRAST:  141mL OMNIPAQUE IOHEXOL 300 MG/ML  SOLN COMPARISON:  None. FINDINGS: Brain: Large area of vasogenic edema in the left hemisphere, slightly decreased. The dense mass at the superior left convexity has been resected. There is a small amount of blood and pneumocephalus over the left hemisphere near the operative site. No nodular contrast enhancement visible at the resection site. Vascular: Remote aneurysm clipping. Skull: Old right pterional craniotomy. New superior left parietal craniotomy. Sinuses/Orbits: Negative Other:  None IMPRESSION: 1. Status post resection of superior left convexity mass. Small amount of blood and pneumocephalus over the left hemisphere near the operative site. 2. Slightly decreased vasogenic edema in the left hemisphere. 3. No nodular contrast enhancement at the resection site. Electronically Signed   By: Ulyses Jarred M.D.   On: 08/30/2020 21:58        Scheduled Meds: . Chlorhexidine Gluconate Cloth  6 each Topical Daily  . dexamethasone  4 mg Oral Q6H  . mometasone-formoterol  2 puff Inhalation BID  . pantoprazole (PROTONIX) IV  40 mg Intravenous QHS  . senna  1 tablet Oral BID  . umeclidinium bromide  1 puff Inhalation Daily   Continuous Infusions: . sodium chloride 75 mL/hr at 08/31/20 1400  . levETIRAcetam Stopped (08/31/20 1056)     LOS: 7 days    Time spent: 15 mins.More than 50% of that time was spent in counseling and/or coordination of care.      Florencia Reasons, MD PhD FACP [facility] P4/15/2022, 4:18 PM

## 2020-08-31 NOTE — Progress Notes (Signed)
Occupational Therapy Treatment Patient Details Name: [name] [name] MRN: [id number] DOB: [birth year] Today's Date: 08/31/2020    History of present illness 65 y.o. male who presented 4/6 with R-sided weakness and numbness. Noncontrast head CT concerning for new 3.1 cm mass lesion involving the left posterior parietal convexity with large area of surrounding vasogenic edema and associated mass-effect. S/p left frontopariental crani for tumor resection on 4/14. PMH: HTN, anemia, bullous emphysema, COPD, and non-small cell lung cancer status post chemoradiation and now on immunotherapy.   OT comments  Upon arrival, pt long sitting in bed and very agreeable to therapy. S/p crani yesterday and pt able to report he had sx yesterday on his brain and he has a slight headache because of it. Pt performing oral care at sink with Min Guard A for safety in standing; continues to present with decreased coordination and in hand manipulation. Pt performing functional mobility in hallway with Min Guard A and RW. Presenting with LLE weakness and noted dragging of LLE as he fatigued. Pt continues to present with cognitive deficits impacting his performance of IADLs. As pt presents with good rehab potential and is very motivated, continue to recommend dc to CIR for intensive OT to optimize pt reaching Mod I-Independent level level with ADLs and IADLs for him to return to home. Will continue to follow acutely as admitted.    Follow Up Recommendations  CIR (pending progress)    Equipment Recommendations  3 in 1 bedside commode    Recommendations for Other Services Rehab consult;Speech consult    Precautions / Restrictions Precautions Precautions: Fall Restrictions Weight Bearing Restrictions: No       Mobility Bed Mobility Overal bed mobility: Modified Independent             General bed mobility comments: up in chair upon PT arrival to room    Transfers Overall transfer level: Needs  assistance Equipment used: Rolling walker (2 wheeled) Transfers: Sit to/from Stand Sit to Stand: Min guard         General transfer comment: for safety, verbal and tactile cuing for proper hand placement when rising/sitting    Balance Overall balance assessment: Needs assistance Sitting-balance support: No upper extremity supported;Feet supported Sitting balance-Leahy Scale: Good Sitting balance - Comments: able to sit EOB/chair without PT assist   Standing balance support: Bilateral upper extremity supported;During functional activity Standing balance-Leahy Scale: Fair Standing balance comment: Able to maintain statis standing at isnk for oral care                           ADL either performed or assessed with clinical judgement   ADL Overall ADL's : Needs assistance/impaired     Grooming: Oral care;Min guard;Supervision/safety;Standing Grooming Details (indicate cue type and reason): Decreased in hand manipulation. Difficulty opening containers and requiring increased time.                 Toilet Transfer: Min guard;Ambulation;RW (simulated to recliner) Toilet Transfer Details (indicate cue type and reason): Min guard A for safety         Functional mobility during ADLs: Rolling walker;Min guard General ADL Comments: Pt continues ot present with decreased cognition. Very motivated.     Vision   Vision Assessment?: Vision impaired- to be further tested in functional context   Perception     Praxis      Cognition Arousal/Alertness: Awake/alert Behavior During Therapy: WFL for tasks assessed/performed Overall Cognitive Status: Impaired/Different from baseline  Area of Impairment: Attention;Safety/judgement;Problem solving;Awareness                   Current Attention Level: Selective     Safety/Judgement: Decreased awareness of safety;Decreased awareness of deficits Awareness: Emergent Problem Solving: Slow processing;Difficulty  sequencing;Requires verbal cues General Comments: Pt orienting to having brain surgery yesterday. Very motivated to participate in therapy and responsive to education and cues. Pt contineus to present with decreased awareness and problem solving requiring cues. Would benefit from higher cogitive assessment.        Exercises     Shoulder Instructions       General Comments VSS on RA    Pertinent Vitals/ Pain       Pain Assessment: Faces Faces Pain Scale: No hurt Pain Intervention(s): Monitored during session  Home Living                                          Prior Functioning/Environment              Frequency  Min 2X/week        Progress Toward Goals  OT Goals(current goals can now be found in the care plan section)  Progress towards OT goals: Progressing toward goals  Acute Rehab OT Goals Patient Stated Goal: to get better OT Goal Formulation: With patient Time For Goal Achievement: 09/08/20 Potential to Achieve Goals: Good ADL Goals Pt Will Perform Lower Body Bathing: with modified independence;sit to/from stand Pt Will Perform Lower Body Dressing: with modified independence;sit to/from stand Pt Will Transfer to Toilet: with modified independence;ambulating Additional ADL Goal #1: Pt will complete 3 step trail making task in moderately distracting environment with minimal redirectional cues Additional ADL Goal #2: Pt will complete HEP with RUE with min vc to increase functional use RUE  Plan Discharge plan remains appropriate    Co-evaluation                 AM-PAC OT "6 Clicks" Daily Activity     Outcome Measure   Help from another person eating meals?: A Little Help from another person taking care of personal grooming?: A Little Help from another person toileting, which includes using toliet, bedpan, or urinal?: A Little Help from another person bathing (including washing, rinsing, drying)?: A Little Help from another  person to put on and taking off regular upper body clothing?: A Little Help from another person to put on and taking off regular lower body clothing?: A Little 6 Click Score: 18    End of Session Equipment Utilized During Treatment: Gait belt;Rolling walker  OT Visit Diagnosis: Unsteadiness on feet (R26.81);Other abnormalities of gait and mobility (R26.89);Muscle weakness (generalized) (M62.81);Apraxia (R48.2);Other symptoms and signs involving cognitive function;Hemiplegia and hemiparesis Hemiplegia - Right/Left: Right Hemiplegia - dominant/non-dominant: Non-Dominant Hemiplegia - caused by: Unspecified   Activity Tolerance Patient tolerated treatment well   Patient Left in chair;with call bell/phone within reach;with chair alarm set   Nurse Communication Mobility status        Time: 6761-9509 OT Time Calculation (min): 34 min  Charges: OT General Charges $OT Visit: 1 Visit OT Treatments $Self Care/Home Management : 23-37 mins  [place], OTR/L Acute Rehab Pager: [phone] Office: [place] 08/31/2020, 9:34 AM

## 2020-08-31 NOTE — Progress Notes (Signed)
Physical Therapy Re-Evaluation Patient Details Name: [name] [name] MRN: [id number] DOB: 05-26-1955 Today's Date: 08/31/2020   History of Present Illness  Pt is a 65 y.o. male who presented 4/6 with R-sided weakness and numbness. Noncontrast head CT concerning for new 3.1 cm mass lesion involving the left posterior parietal convexity with large area of surrounding vasogenic edema and associated mass-effect. This is consistent with metastatic disease. S/p stereotactic L frontoparietal craniotomy for resection of tumor 4/14. PMH: HTN, anemia, bullous emphysema, COPD, and non-small cell lung cancer status post chemoradiation and now on immunotherapy.    Clinical Impression  Pt presents with condition above and deficits mentioned below, see PT Problem List. PTA, he was living alone independently without use of AD/AE and walking up to ~1 mile to get groceries at times. Currently, pt demonstrates R lower extremity weakness, incoordination, impaired spatial and deficits awareness, and impaired balance that place him at risk for falls and injuries. Pt demonstrating R drift and R foot drag with decreased R foot step length when ambulating, needing a RW and minA for stability and guidance. As his PLOF varies greatly from his current level of function and he is motivated to improve he could greatly benefit from intensive therapies in the CIR setting to maximize his independence and safety with all functional mobility. Pt is not safe to d/c home alone at this time. Will continue to follow acutely. Of note, pt's R sided strength and coordination along with vision appear to have improved since s/p tumor resection.    Follow Up Recommendations CIR;Supervision/Assistance - 24 hour    Equipment Recommendations  Rolling walker with 5" wheels;3in1 (PT)    Recommendations for Other Services Rehab consult     Precautions / Restrictions Precautions Precautions: Fall Restrictions Weight Bearing Restrictions: No       Mobility  Bed Mobility Overal bed mobility: Modified Independent             General bed mobility comments: Pt sitting up in recliner upon arrival.    Transfers Overall transfer level: Needs assistance Equipment used: Rolling walker (2 wheeled);None Transfers: Sit to/from Stand Sit to Stand: Min guard         General transfer comment: for safety, verbal and tactile cuing for proper hand placement when rising/sitting  Ambulation/Gait Ambulation/Gait assistance: Min assist Gait Distance (Feet): 100 Feet (x2 bouts of ~20 ft > ~100 ft) Assistive device: Rolling walker (2 wheeled);None Gait Pattern/deviations: Step-to pattern;Decreased step length - right;Decreased stride length;Decreased dorsiflexion - right;Trunk flexed;Step-through pattern Gait velocity: reduced Gait velocity interpretation: <1.31 ft/sec, indicative of household ambulator General Gait Details: Initially attempting without UE support, but pt tends to lean to R and display R lateral imbalance and R foot drag with significantly slow gait speed. Thus, provided pt with RW on second bout with improved upright posture, but still tends to drift to R and bump into obstacles. Cues provided to remain more midline in RW and hallway and cues to increase R step length and foot clearance, with momentary success. MinA for stability and safety.  Stairs            Wheelchair Mobility    Modified Rankin (Stroke Patients Only) Modified Rankin (Stroke Patients Only) Pre-Morbid Rankin Score: No symptoms Modified Rankin: Moderately severe disability     Balance Overall balance assessment: Needs assistance Sitting-balance support: No upper extremity supported;Feet supported Sitting balance-Leahy Scale: Good Sitting balance - Comments: able to sit EOB/chair without PT assist Postural control: Right lateral lean Standing balance support:  No upper extremity supported;Bilateral upper extremity supported;During  functional activity Standing balance-Leahy Scale: Fair Standing balance comment: Able to maintain balance without UE support but tends to lean to R, thus benefits from bil UE support. Up to minA for stability.                             Pertinent Vitals/Pain Pain Assessment: Faces Faces Pain Scale: No hurt Pain Intervention(s): Monitored during session    Home Living Family/patient expects to be discharged to:: Private residence Living Arrangements: Alone Available Help at Discharge: Neighbor;Available PRN/intermittently;Friend(s) (preacher stops by every now and then; family lives in [place], unsure if his sister could come stay with him initially) Type of Home: Apartment Home Access: Level entry     Home Layout: One level Home Equipment: Grab bars - tub/shower      Prior Function Level of Independence: Independent         Comments: Pt takes public transportation or walks to stores, he does not drive. Pt independent with all ADLs and mobility without AD/AE. No falls in past 6 months prior to onset of symptoms.     Hand Dominance   Dominant Hand: Left    Extremity/Trunk Assessment   Upper Extremity Assessment Upper Extremity Assessment: Defer to OT evaluation RUE Deficits / Details: Decreased coorindation both fine and gross. 4/5 strength. RUE Sensation: decreased light touch;decreased proprioception RUE Coordination: decreased fine motor;decreased gross motor    Lower Extremity Assessment Lower Extremity Assessment: RLE deficits/detail RLE Deficits / Details: MMT scores of 4 to 4+ grossly compared to 4+ to 5 on L RLE Sensation:  (throughout) RLE Coordination: decreased fine motor;decreased gross motor (slow initiation of foot tapping when trying to tap bil feet simultaneously)    Cervical / Trunk Assessment Cervical / Trunk Assessment: Normal  Communication   Communication: No difficulties  Cognition Arousal/Alertness: Awake/alert Behavior  During Therapy: WFL for tasks assessed/performed Overall Cognitive Status: Impaired/Different from baseline Area of Impairment: Attention;Safety/judgement;Problem solving;Awareness                   Current Attention Level: Selective     Safety/Judgement: Decreased awareness of safety;Decreased awareness of deficits Awareness: Emergent Problem Solving: Slow processing;Difficulty sequencing;Requires verbal cues General Comments: Very motivated to participate in therapy and responsive to education and cues. Pt contineus to present with decreased awareness and problem solving requiring cues. Pt with poor awareness into his deficits to drift to the R and needs cues to correct often.      General Comments General comments (skin integrity, edema, etc.): VSS on RA    Exercises     Assessment/Plan    PT Assessment Patient needs continued PT services  PT Problem List Decreased strength;Decreased range of motion;Decreased activity tolerance;Decreased balance;Decreased mobility;Decreased coordination;Decreased safety awareness;Impaired sensation;Decreased cognition;Decreased knowledge of use of DME       PT Treatment Interventions DME instruction;Gait training;Functional mobility training;Therapeutic activities;Therapeutic exercise;Balance training;Neuromuscular re-education;Patient/family education;Cognitive remediation    PT Goals (Current goals can be found in the Care Plan section)  Acute Rehab PT Goals Patient Stated Goal: to improve and return home PT Goal Formulation: With patient Time For Goal Achievement: 09/06/20 Potential to Achieve Goals: Good    Frequency Min 4X/week   Barriers to discharge        Co-evaluation               AM-PAC PT "6 Clicks" Mobility  Outcome Measure Help needed  turning from your back to your side while in a flat bed without using bedrails?: None Help needed moving from lying on your back to sitting on the side of a flat bed without  using bedrails?: None Help needed moving to and from a bed to a chair (including a wheelchair)?: A Little Help needed standing up from a chair using your arms (e.g., wheelchair or bedside chair)?: A Little Help needed to walk in hospital room?: A Little Help needed climbing 3-5 steps with a railing? : A Lot 6 Click Score: 19    End of Session Equipment Utilized During Treatment: Gait belt Activity Tolerance: Patient tolerated treatment well Patient left: in chair;with call bell/phone within reach;with chair alarm set Nurse Communication: Mobility status PT Visit Diagnosis: Unsteadiness on feet (R26.81);Other abnormalities of gait and mobility (R26.89);Muscle weakness (generalized) (M62.81);Difficulty in walking, not elsewhere classified (R26.2);Other symptoms and signs involving the nervous system (R29.898);Hemiplegia and hemiparesis Hemiplegia - Right/Left: Right Hemiplegia - dominant/non-dominant: Non-dominant Hemiplegia - caused by:  (tumor)    Time: 2957-4734 PT Time Calculation (min) (ACUTE ONLY): 28 min   Charges:   PT Evaluation $PT Re-evaluation: 1 Re-eval PT Treatments $Gait Training: 8-22 mins        [name] [name], PT, DPT Acute Rehabilitation Services  Pager: [phone] Office: [phone]   Orvan Falconer 08/31/2020, 11:40 AM

## 2020-09-01 LAB — CBC WITH DIFFERENTIAL/PLATELET
Abs Immature Granulocytes: 0.15 10*3/uL — ABNORMAL HIGH (ref 0.00–0.07)
Basophils Absolute: 0 10*3/uL (ref 0.0–0.1)
Basophils Relative: 0 %
Eosinophils Absolute: 0 10*3/uL (ref 0.0–0.5)
Eosinophils Relative: 0 %
HCT: 33.7 % — ABNORMAL LOW (ref 39.0–52.0)
Hemoglobin: 11.1 g/dL — ABNORMAL LOW (ref 13.0–17.0)
Immature Granulocytes: 1 %
Lymphocytes Relative: 5 %
Lymphs Abs: 0.5 10*3/uL — ABNORMAL LOW (ref 0.7–4.0)
MCH: 26.2 pg (ref 26.0–34.0)
MCHC: 32.9 g/dL (ref 30.0–36.0)
MCV: 79.5 fL — ABNORMAL LOW (ref 80.0–100.0)
Monocytes Absolute: 1.4 10*3/uL — ABNORMAL HIGH (ref 0.1–1.0)
Monocytes Relative: 13 %
Neutro Abs: 8.7 10*3/uL — ABNORMAL HIGH (ref 1.7–7.7)
Neutrophils Relative %: 81 %
Platelets: 198 10*3/uL (ref 150–400)
RBC: 4.24 MIL/uL (ref 4.22–5.81)
RDW: 15.3 % (ref 11.5–15.5)
WBC: 10.7 10*3/uL — ABNORMAL HIGH (ref 4.0–10.5)
nRBC: 0 % (ref 0.0–0.2)

## 2020-09-01 LAB — BASIC METABOLIC PANEL
Anion gap: 7 (ref 5–15)
BUN: 14 mg/dL (ref 8–23)
CO2: 24 mmol/L (ref 22–32)
Calcium: 8.6 mg/dL — ABNORMAL LOW (ref 8.9–10.3)
Chloride: 103 mmol/L (ref 98–111)
Creatinine, Ser: 0.69 mg/dL (ref 0.61–1.24)
GFR, Estimated: >60 mL/min (ref >60)
Glucose, Bld: 158 mg/dL — ABNORMAL HIGH (ref 70–99)
Potassium: 4 mmol/L (ref 3.5–5.1)
Sodium: 134 mmol/L — ABNORMAL LOW (ref 135–145)

## 2020-09-01 LAB — MAGNESIUM: Magnesium: 2.1 mg/dL (ref 1.7–2.4)

## 2020-09-01 MED ORDER — PANTOPRAZOLE SODIUM 40 MG PO TBEC
40.0000 mg | DELAYED_RELEASE_TABLET | Freq: Every day | ORAL | Status: DC
  Administered 2020-09-01 – 2020-09-20 (×20): 40 mg via ORAL
  Filled 2020-09-01: qty 1
  Filled 2020-09-01: qty 2
  Filled 2020-09-01 (×6): qty 1
  Filled 2020-09-01: qty 2
  Filled 2020-09-01 (×11): qty 1

## 2020-09-01 MED ORDER — LEVETIRACETAM 500 MG PO TABS
500.0000 mg | ORAL_TABLET | Freq: Two times a day (BID) | ORAL | Status: DC
  Administered 2020-09-01 – 2020-09-21 (×41): 500 mg via ORAL
  Filled 2020-09-01 (×41): qty 1

## 2020-09-01 NOTE — Progress Notes (Signed)
Patient ID: [name] [name], male   DOB: 1956/04/16, 65 y.o.   MRN: [id number] BP 118/65   Pulse 87   Temp 98.4 F (36.9 C) (Oral)   Resp (!) 21   Ht 5\' 1"  (1.549 m)   Wt 56.3 kg   SpO2 96%   BMI 23.45 kg/m  Alert and oriented x 4, speech is clear, fluent Perrl, full eom Symmetric facial movements Mild weakness right lower and upper extremity,  Wound dressing is clean, no signs of infection May be transferred safely from unit by primary team.

## 2020-09-01 NOTE — Progress Notes (Signed)
PROGRESS NOTE Brief same day note  [name] [name]  KDT:[id number] DOB: Aug 14, 1955 DOA: 08/22/2020 PCP: [name], NP   Chief Complain: Right arm/right leg numbness/weakness  Brief Narrative: Patient is a 65 year old male with history of COPD, non-small cell lung cancer status post chemoradiation, now on immunotherapy, active smoker who presented to the emergency department with further evaluation of right-sided weakness.  On presentation, he was hemodynamically stable.  Noncontrast head CT showed new 3.1 cm mass lesion involving the left posterior parietal convexity with large area of surrounding vasogenic edema and associated mass-effect.  CT with contrast confirmed 3.2 x 2.2 x 3 cm intra-axial mass lesion consistent with metastasis.  Started on Keppra, Decadron. Oncology,neurooncology and neurosurgery consulted,planning for  preop stereotactic radiosurgery followed by surgical resection/cranitomy on  4/14  PT recommending inpatient rehab on discharge.   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  CT with contrast confirmed 3.2 x 2.2 x 3 cm intra-axial mass lesion consistent with metastasis.    Getting preop stereotactic radiosurgery followed by surgical resection/cranitomy on  4/14 Continue Decadron, also on Keppra 500 mg twice daily , duration per neurosurgery  weakness on the right side seems improved,  Will follow Oncology,neurooncology and neurosurgery recommendation  Non-small cell lung cancer: Follows with Dr. Julien Nordmann, status post chemoradiation, now on immunotherapy with Imfinzi (anti PD1)  COPD: Currently stable, not in exacerbation.  Continue bronchodilators.  On room air.  Right-sided weakness: PT recommended CIR on discharge.          DVT prophylaxis:Lovenox Code Status: Full Family Communication: talked to [facility] Ms [name]  at bedside on 4/14 Status is: Inpatient   Dispo: The patient is from: Home              Anticipated d/c is to: CIR              Patient currently is not medically stable to d/c.   Difficult to place patient No     Consultants: neurosurgery/radiation oncology  Procedures:  S/p Stereotactic left frontoparietal craniotomy for resection of tumor on 4/14 by Dr Kathyrn Sheriff   Antimicrobials:  Anti-infectives (From admission, onward)   Start     Dose/Rate Route Frequency Ordered Stop   08/30/20 1800  ceFAZolin (ANCEF) IVPB 2g/100 mL premix        2 g 200 mL/hr over 30 Minutes Intravenous Every 8 hours 08/30/20 1643 08/31/20 0312   08/30/20 1315  ceFAZolin (ANCEF) IVPB 2g/100 mL premix  Status:  Discontinued        2 g 200 mL/hr over 30 Minutes Intravenous To Surgery 08/30/20 1249 08/30/20 1746      Subjective: He is sitting up in chair, reports some headache, no nausea no vomiting   Hemodynamically stable.   He appears improved , RUE able to move more    Objective: Vitals:   09/01/20 1100 09/01/20 1200 09/01/20 1300 09/01/20 1400  BP: 117/61 120/68 118/62 136/73  Pulse: 95 88 81 86  Resp: 16 (!) 23 19 18   Temp:  98 F (36.7 C)    TempSrc:  Oral    SpO2: 97% 98% 96% 98%  Weight:      Height:        Intake/Output Summary (Last 24 hours) at 09/01/2020 1415 Last  data filed at 09/01/2020 1400 Gross per 24 hour  Intake 1728.93 ml  Output 1910 ml  Net -181.07 ml   Filed Weights   08/23/20 0552  Weight: 56.3 kg    Examination:  General exam: Overall comfortable, not in distress, pleasant, aaox3, dressing on scalp HEENT: PERRL Respiratory system:  no wheezes or crackles  Cardiovascular system: S1 & S2 heard, RRR.  Gastrointestinal system: Abdomen is nondistended, soft and nontender. Central nervous system: Alert and oriented, weakness on the right side with motor of  4+/5 on the right upper and right lower extremity Extremities: No edema, no clubbing ,no cyanosis Skin: No rashes, no ulcers,no icterus   Data Reviewed: I have personally reviewed following labs and imaging studies  CBC: Recent Labs  Lab 08/31/20 0310 09/01/20 0153  WBC 12.0* 10.7*  NEUTROABS  --  8.7*  HGB 11.9* 11.1*  HCT 37.0* 33.7*  MCV 81.0 79.5*  PLT 236 562   Basic Metabolic Panel: Recent Labs  Lab 08/31/20 0310 09/01/20 0153  NA 132* 134*  K 4.1 4.0  CL 104 103  CO2 22 24  GLUCOSE 109* 158*  BUN 14 14  CREATININE 0.90 0.69  CALCIUM 8.4* 8.6*  MG  --  2.1   GFR: Estimated Creatinine Clearance: 69.9 mL/min (by C-G formula based on SCr of 0.69 mg/dL). Liver Function Tests: No results for input(s): AST, ALT, ALKPHOS, BILITOT, PROT, ALBUMIN in the last 168 hours. No results for input(s): LIPASE, AMYLASE in the last 168 hours. No results for input(s): AMMONIA in the last 168 hours. Coagulation Profile: No results for input(s): INR, PROTIME in the last 168 hours. Cardiac Enzymes: No results for input(s): CKTOTAL, CKMB, CKMBINDEX, TROPONINI in the last 168 hours. BNP (last 3 results) No results for input(s): PROBNP in the last 8760 hours. HbA1C: No results for input(s): HGBA1C in the last 72 hours. CBG: No results for input(s): GLUCAP in the last 168 hours. Lipid Profile: No results for input(s): CHOL, HDL, LDLCALC, TRIG, CHOLHDL, LDLDIRECT in the last 72 hours. Thyroid Function Tests: No results for input(s): TSH, T4TOTAL, FREET4, T3FREE, THYROIDAB in the last 72 hours. Anemia Panel: No results for input(s): VITAMINB12, FOLATE, FERRITIN, TIBC, IRON, RETICCTPCT in the last 72 hours. Sepsis Labs: No results for input(s): PROCALCITON, LATICACIDVEN in the last 168 hours.  Recent Results (from the past 240 hour(s))  Resp Panel by RT-PCR (Flu A&B, Covid) Nasopharyngeal Swab     Status: None   Collection Time: 08/23/20 12:10 AM   Specimen: Nasopharyngeal Swab;  Nasopharyngeal(NP) swabs in vial transport medium  Result Value Ref Range Status   SARS Coronavirus 2 by RT PCR NEGATIVE NEGATIVE Final    Comment: (NOTE) SARS-CoV-2 target nucleic acids are NOT DETECTED.  The SARS-CoV-2 RNA is generally detectable in upper respiratory specimens during the acute phase of infection. The lowest concentration of SARS-CoV-2 viral copies this assay can detect is 138 copies/mL. A negative result does not preclude SARS-Cov-2 infection and should not be used as the sole basis for treatment or other patient management decisions. A negative result may occur with  improper specimen collection/handling, submission of specimen other than nasopharyngeal swab, presence of viral mutation(s) within the areas targeted by this assay, and inadequate number of viral copies(<138 copies/mL). A negative result must be combined with clinical observations, patient history, and epidemiological information. The expected result is Negative.  Fact Sheet for Patients:  EntrepreneurPulse.com.au  Fact Sheet for Healthcare Providers:  IncredibleEmployment.be  This test is no t yet  approved or cleared by the [place] and  has been authorized for detection and/or diagnosis of SARS-CoV-2 by FDA under an Emergency Use Authorization (EUA). This EUA will remain  in effect (meaning this test can be used) for the duration of the COVID-19 declaration under Section 564(b)(1) of the Act, 21 U.S.C.section 360bbb-3(b)(1), unless the authorization is terminated  or revoked sooner.       Influenza A by PCR NEGATIVE NEGATIVE Final   Influenza B by PCR NEGATIVE NEGATIVE Final    Comment: (NOTE) The Xpert Xpress SARS-CoV-2/FLU/RSV plus assay is intended as an aid in the diagnosis of influenza from Nasopharyngeal swab specimens and should not be used as a sole basis for treatment. Nasal washings and aspirates are unacceptable for Xpert Xpress  SARS-CoV-2/FLU/RSV testing.  Fact Sheet for Patients: EntrepreneurPulse.com.au  Fact Sheet for Healthcare Providers: IncredibleEmployment.be  This test is not yet approved or cleared by the [place] FDA and has been authorized for detection and/or diagnosis of SARS-CoV-2 by FDA under an Emergency Use Authorization (EUA). This EUA will remain in effect (meaning this test can be used) for the duration of the COVID-19 declaration under Section 564(b)(1) of the Act, 21 U.S.C. section 360bbb-3(b)(1), unless the authorization is terminated or revoked.  Performed at [facility], [place] [address] [place], [place] [address]   Surgical PCR screen     Status: None   Collection Time: 08/23/20  4:28 AM   Specimen: Nasal Mucosa; Nasal Swab  Result Value Ref Range Status   MRSA, PCR NEGATIVE NEGATIVE Final   Staphylococcus aureus NEGATIVE NEGATIVE Final    Comment: (NOTE) The Xpert SA Assay (FDA approved for NASAL specimens in patients 69 years of age and older), is one component of a comprehensive surveillance program. It is not intended to diagnose infection nor to guide or monitor treatment. Performed at [facility], [place] [address], [place],  [address]          Radiology Studies: X-ray chest PA or AP  Result Date: 08/30/2020 CLINICAL DATA:  Central line placement EXAM: CHEST  1 VIEW COMPARISON:  08/23/2020 FINDINGS: The heart size and mediastinal contours are within normal limits. There is a left chest vascular catheter, tip directed into the internal jugular and not imaged at the superior extent of the exam. Bullous change of the left apex, unchanged. The visualized skeletal structures are unremarkable. IMPRESSION: 1. There is a malpositioned left chest vascular catheter, tip directed into the internal jugular and not imaged at the superior extent of the exam. 2. No acute abnormality of the lungs. These results will be called  to the ordering clinician or representative by the Radiologist Assistant, and communication documented in the PACS or [employer]. Electronically Signed   By: Eddie Candle M.D.   On: 08/30/2020 17:40   CT HEAD W & WO CONTRAST  Result Date: 08/30/2020 CLINICAL DATA:  Postop craniotomy EXAM: CT HEAD WITHOUT AND WITH CONTRAST TECHNIQUE: Contiguous axial images were obtained from the base of the skull through the vertex without and with intravenous contrast CONTRAST:  160mL OMNIPAQUE IOHEXOL 300 MG/ML  SOLN COMPARISON:  None. FINDINGS: Brain: Large area of vasogenic edema in the left hemisphere, slightly decreased. The dense mass at the superior left convexity has been resected. There is a small amount of blood and pneumocephalus over the left hemisphere near the operative site. No nodular contrast enhancement visible at the resection site. Vascular: Remote aneurysm clipping. Skull: Old right pterional craniotomy.  New superior left parietal craniotomy. Sinuses/Orbits: Negative Other: None IMPRESSION: 1. Status post resection of superior left convexity mass. Small amount of blood and pneumocephalus over the left hemisphere near the operative site. 2. Slightly decreased vasogenic edema in the left hemisphere. 3. No nodular contrast enhancement at the resection site. Electronically Signed   By: Ulyses Jarred M.D.   On: 08/30/2020 21:58        Scheduled Meds: . Chlorhexidine Gluconate Cloth  6 each Topical Daily  . dexamethasone  4 mg Oral Q6H  . levETIRAcetam  500 mg Oral BID  . mometasone-formoterol  2 puff Inhalation BID  . pantoprazole  40 mg Oral Q supper  . senna  1 tablet Oral BID  . umeclidinium bromide  1 puff Inhalation Daily   Continuous Infusions:    LOS: 8 days    Time spent: 15 mins.More than 50% of that time was spent in counseling and/or coordination of care.      Florencia Reasons, MD PhD FACP [facility] P4/16/2022, 2:15 PM

## 2020-09-02 MED ORDER — DEXAMETHASONE 4 MG PO TABS
4.0000 mg | ORAL_TABLET | Freq: Two times a day (BID) | ORAL | Status: DC
  Administered 2020-09-02 – 2020-09-07 (×10): 4 mg via ORAL
  Filled 2020-09-02 (×10): qty 1

## 2020-09-02 NOTE — Plan of Care (Signed)

## 2020-09-02 NOTE — Progress Notes (Signed)
Patient ID: [name] [name], male   DOB: 05-02-1956, 65 y.o.   MRN: [id number] BP 111/62 (BP Location: Right Arm)   Pulse 84   Temp 98.6 F (37 C) (Oral)   Resp 20   Ht 5\' 1"  (1.549 m)   Wt 56.3 kg   SpO2 99%   BMI 23.45 kg/m  Alert and oriented x 4, speech is clear and fluent Moving all extremities well Wound is clean, dry, no signs of infection Symmetric facies Tongue and uvula midline Hearing intact to voice Doing well

## 2020-09-02 NOTE — Progress Notes (Addendum)
PROGRESS NOTE Brief same day note  [name] [name]  XNA:[id number] DOB: 1956/03/21 DOA: 08/22/2020 PCP: [name], NP   Chief Complain: Right arm/right leg numbness/weakness  Brief Narrative: Patient is a 65 year old male with history of COPD, non-small cell lung cancer status post chemoradiation, now on immunotherapy, active smoker who presented to the emergency department with further evaluation of right-sided weakness.  On presentation, he was hemodynamically stable.  Noncontrast head CT showed new 3.1 cm mass lesion involving the left posterior parietal convexity with large area of surrounding vasogenic edema and associated mass-effect.  CT with contrast confirmed 3.2 x 2.2 x 3 cm intra-axial mass lesion consistent with metastasis.  Started on Keppra, Decadron. Oncology,neurooncology and neurosurgery consulted,planning for  preop stereotactic radiosurgery followed by surgical resection/cranitomy on  4/14  PT recommending inpatient rehab on discharge.   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  CT with contrast confirmed 3.2 x 2.2 x 3 cm intra-axial mass lesion consistent with metastasis.    Getting preop stereotactic radiosurgery followed by surgical resection/cranitomy on  4/14 On Decadron and  Keppra 500 mg twice daily , duration per neurosurgery  weakness on the right side seems improved,  Will follow Oncology,neurooncology and neurosurgery recommendation  PM Addendum: Case discussed with Dr. Christella Noa in person who recommended taper Decadron off in the next few days, recommend Keppra for 1 to 2 weeks.  Non-small cell lung cancer: Follows with Dr. Julien Nordmann, status post chemoradiation, now on immunotherapy with Imfinzi (anti PD1)  COPD: Currently  stable, not in exacerbation.  Continue bronchodilators.  On room air.  Right-sided weakness: PT recommended CIR on discharge.         DVT prophylaxis:Lovenox Code Status: Full Family Communication: talked to [facility] Ms [name]  at bedside on 4/14 Status is: Inpatient     Dispo: The patient is from: Home              Anticipated d/c is to: CIR              medically stable to discharge   Difficult to place patient No     Consultants: neurosurgery/radiation oncology  Procedures:  S/p Stereotactic left frontoparietal craniotomy for resection of tumor on 4/14 by Dr Kathyrn Sheriff   Antimicrobials:  Anti-infectives (From admission, onward)   Start     Dose/Rate Route Frequency Ordered Stop   08/30/20 1800  ceFAZolin (ANCEF) IVPB 2g/100 mL premix        2 g 200 mL/hr over 30 Minutes Intravenous Every 8 hours 08/30/20 1643 08/31/20 0312   08/30/20 1315  ceFAZolin (ANCEF) IVPB 2g/100 mL premix  Status:  Discontinued        2 g 200 mL/hr over 30 Minutes Intravenous To Surgery 08/30/20 1249 08/30/20 1746      Subjective: He is in bed, reports less headache, no nausea no vomiting   Hemodynamically stable.   Right side weakness is improving Stats it is march but able to self correct     Objective: Vitals:   09/01/20 2100 09/01/20 2353 09/02/20 0524 09/02/20 0750  BP: 122/85 120/72 120/69   Pulse: 88 76 82   Resp: 16 14 15    Temp: (!) 97.5 F (36.4 C) 98.6 F (37 C) 98.1 F (36.7 C)  TempSrc: Oral Oral Oral   SpO2: 100% 100% 98% 95%  Weight:      Height:        Intake/Output Summary (Last 24 hours) at 09/02/2020 0921 Last data filed at 09/02/2020 0910 Gross per 24 hour  Intake 240 ml  Output 1675 ml  Net -1435 ml   Filed Weights   08/23/20 0552  Weight: 56.3 kg    Examination:  General exam: Overall comfortable, not in distress, pleasant, aaox3, dressing on scalp HEENT: PERRL Respiratory system:  no wheezes or crackles  Cardiovascular system: S1 & S2  heard, RRR.  Gastrointestinal system: Abdomen is nondistended, soft and nontender. Central nervous system: Alert and oriented, right arm able to lift to above shoulder, right leg able to lift against gravity at 15degree Extremities: No edema, no clubbing ,no cyanosis Skin: No rashes, no ulcers,no icterus   Data Reviewed: I have personally reviewed following labs and imaging studies  CBC: Recent Labs  Lab 08/31/20 0310 09/01/20 0153  WBC 12.0* 10.7*  NEUTROABS  --  8.7*  HGB 11.9* 11.1*  HCT 37.0* 33.7*  MCV 81.0 79.5*  PLT 236 701   Basic Metabolic Panel: Recent Labs  Lab 08/31/20 0310 09/01/20 0153  NA 132* 134*  K 4.1 4.0  CL 104 103  CO2 22 24  GLUCOSE 109* 158*  BUN 14 14  CREATININE 0.90 0.69  CALCIUM 8.4* 8.6*  MG  --  2.1   GFR: Estimated Creatinine Clearance: 69.9 mL/min (by C-G formula based on SCr of 0.69 mg/dL). Liver Function Tests: No results for input(s): AST, ALT, ALKPHOS, BILITOT, PROT, ALBUMIN in the last 168 hours. No results for input(s): LIPASE, AMYLASE in the last 168 hours. No results for input(s): AMMONIA in the last 168 hours. Coagulation Profile: No results for input(s): INR, PROTIME in the last 168 hours. Cardiac Enzymes: No results for input(s): CKTOTAL, CKMB, CKMBINDEX, TROPONINI in the last 168 hours. BNP (last 3 results) No results for input(s): PROBNP in the last 8760 hours. HbA1C: No results for input(s): HGBA1C in the last 72 hours. CBG: No results for input(s): GLUCAP in the last 168 hours. Lipid Profile: No results for input(s): CHOL, HDL, LDLCALC, TRIG, CHOLHDL, LDLDIRECT in the last 72 hours. Thyroid Function Tests: No results for input(s): TSH, T4TOTAL, FREET4, T3FREE, THYROIDAB in the last 72 hours. Anemia Panel: No results for input(s): VITAMINB12, FOLATE, FERRITIN, TIBC, IRON, RETICCTPCT in the last 72 hours. Sepsis Labs: No results for input(s): PROCALCITON, LATICACIDVEN in the last 168 hours.  No results found for  this or any previous visit (from the past 240 hour(s)).       Radiology Studies: No results found.      Scheduled Meds: . Chlorhexidine Gluconate Cloth  6 each Topical Daily  . dexamethasone  4 mg Oral Q6H  . levETIRAcetam  500 mg Oral BID  . mometasone-formoterol  2 puff Inhalation BID  . pantoprazole  40 mg Oral Q supper  . senna  1 tablet Oral BID  . umeclidinium bromide  1 puff Inhalation Daily   Continuous Infusions:    LOS: 9 days    Time spent: 15 mins.More than 50% of that time was spent in counseling and/or coordination of care.      Florencia Reasons, MD PhD FACP [facility] P4/17/2022, 9:21 AM

## 2020-09-03 ENCOUNTER — Inpatient Hospital Stay: Payer: Medicaid Other | Attending: Internal Medicine

## 2020-09-03 DIAGNOSIS — C7931 Secondary malignant neoplasm of brain: Principal | ICD-10-CM

## 2020-09-03 NOTE — Progress Notes (Signed)
PROGRESS NOTE Brief same day note  Hence [name] [name]  JOI:[id number] DOB: [birth year] DOA: 08/22/2020 PCP: [name], NP   Chief Complain: Right arm/right leg numbness/weakness  Brief Narrative: Patient is a 65 year old male with history of COPD, non-small cell lung cancer status post chemoradiation, now on immunotherapy, active smoker who presented to the emergency department with further evaluation of right-sided weakness.  On presentation, he was hemodynamically stable.  Noncontrast head CT showed new 3.1 cm mass lesion involving the left posterior parietal convexity with large area of surrounding vasogenic edema and associated mass-effect.  CT with contrast confirmed 3.2 x 2.2 x 3 cm intra-axial mass lesion consistent with metastasis.  Started on Keppra, Decadron. Oncology,neurooncology and neurosurgery consulted,underwent stereotactic left frontoparietal cranitomy for resection of tumor on  4/14  PT recommending inpatient rehab on discharge.  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  CT with contrast confirmed 3.2 x 2.2 x 3 cm intra-axial mass lesion consistent with metastasis.   Oncology,neurooncology and neurosurgery consulted. underwent stereotactic left frontoparietal cranitomy for resection of tumor on  4/14 Continue Decadron taper , also on Keppra 500 mg twice daily, will continue Keppra for 1 to 2 weeks postsurgery. Currently alert and oriented but was confused on presentation. He has mild weakness on the right side  Non-small cell lung cancer: Follows with Dr. Julien Nordmann, status post chemoradiation, now on chemotherapy with Imfinzi.  COPD: Currently stable, not in exacerbation.  Continue bronchodilators.  On room air.  Right-sided  weakness: PT recommended CIR on discharge.         DVT prophylaxis:Lovenox Code Status: Full Family Communication: None at bedside Status is: Inpatient   Dispo: The patient is from: Home              Anticipated d/c is to: CIR              Patient currently is medically stable for discharge   Difficult to place patient No     Consultants: Neurology  Procedures:None  Antimicrobials:  Anti-infectives (From admission, onward)   Start     Dose/Rate Route Frequency Ordered Stop   08/30/20 1800  ceFAZolin (ANCEF) IVPB 2g/100 mL premix        2 g 200 mL/hr over 30 Minutes Intravenous Every 8 hours 08/30/20 1643 08/31/20 0312   08/30/20 1315  ceFAZolin (ANCEF) IVPB 2g/100 mL premix  Status:  Discontinued        2 g 200 mL/hr over 30 Minutes Intravenous To Surgery 08/30/20 1249 08/30/20 1746      Subjective: Patient seen and examined the bedside this morning.  Hemodynamically stable.  Comfortable.  Alert and oriented.  Complains of some pain on the suture site on the scalp  Objective: Vitals:   09/02/20 2113 09/03/20 0250 09/03/20 0716 09/03/20 0802  BP: 110/70 107/64  106/72  Pulse: 85 76  79  Resp: 16 14  17   Temp: 97.9 F (36.6 C) 97.9 F (36.6 C)  98.2 F (36.8 C)  TempSrc: Oral Oral  Oral  SpO2: 97% 97% 99% 98%  Weight:      Height:        Intake/Output Summary (Last 24 hours) at 09/03/2020 0956 Last data filed at 09/03/2020 0440 Gross per 24 hour  Intake --  Output 500 ml  Net -500 ml   Filed Weights   08/23/20 0552  Weight: 56.3 kg    Examination:  General exam: Overall comfortable, not in distress HEENT: PEARRL, sutures on the scalp Respiratory system:  no wheezes or crackles  Cardiovascular system: S1 & S2 heard, RRR.  Gastrointestinal system: Abdomen is nondistended, soft and nontender. Central nervous system: Alert and oriented, mild weakness on the left side Extremities: No edema, no clubbing ,no cyanosis Skin: No rashes, no ulcers,no  icterus   Data Reviewed: I have personally reviewed following labs and imaging studies  CBC: Recent Labs  Lab 08/31/20 0310 09/01/20 0153  WBC 12.0* 10.7*  NEUTROABS  --  8.7*  HGB 11.9* 11.1*  HCT 37.0* 33.7*  MCV 81.0 79.5*  PLT 236 517   Basic Metabolic Panel: Recent Labs  Lab 08/31/20 0310 09/01/20 0153  NA 132* 134*  K 4.1 4.0  CL 104 103  CO2 22 24  GLUCOSE 109* 158*  BUN 14 14  CREATININE 0.90 0.69  CALCIUM 8.4* 8.6*  MG  --  2.1   GFR: Estimated Creatinine Clearance: 69.9 mL/min (by C-G formula based on SCr of 0.69 mg/dL). Liver Function Tests: No results for input(s): AST, ALT, ALKPHOS, BILITOT, PROT, ALBUMIN in the last 168 hours. No results for input(s): LIPASE, AMYLASE in the last 168 hours. No results for input(s): AMMONIA in the last 168 hours. Coagulation Profile: No results for input(s): INR, PROTIME in the last 168 hours. Cardiac Enzymes: No results for input(s): CKTOTAL, CKMB, CKMBINDEX, TROPONINI in the last 168 hours. BNP (last 3 results) No results for input(s): PROBNP in the last 8760 hours. HbA1C: No results for input(s): HGBA1C in the last 72 hours. CBG: No results for input(s): GLUCAP in the last 168 hours. Lipid Profile: No results for input(s): CHOL, HDL, LDLCALC, TRIG, CHOLHDL, LDLDIRECT in the last 72 hours. Thyroid Function Tests: No results for input(s): TSH, T4TOTAL, FREET4, T3FREE, THYROIDAB in the last 72 hours. Anemia Panel: No results for input(s): VITAMINB12, FOLATE, FERRITIN, TIBC, IRON, RETICCTPCT in the last 72 hours. Sepsis Labs: No results for input(s): PROCALCITON, LATICACIDVEN in the last 168 hours.  No results found for this or any previous visit (from the past 240 hour(s)).       Radiology Studies: No results found.      Scheduled Meds: . Chlorhexidine Gluconate Cloth  6 each Topical Daily  . dexamethasone  4 mg Oral Q12H  . levETIRAcetam  500 mg Oral BID  . mometasone-formoterol  2 puff Inhalation  BID  . pantoprazole  40 mg Oral Q supper  . senna  1 tablet Oral BID  . umeclidinium bromide  1 puff Inhalation Daily   Continuous Infusions:    LOS: 10 days    Time spent: 25 mins.More than 50% of that time was spent in counseling and/or coordination of care.      [name], MD [facility] P4/18/2022, 9:56 AM

## 2020-09-03 NOTE — Progress Notes (Signed)
  NEUROSURGERY PROGRESS NOTE   No issues overnight. Pt without significant complaint today.  EXAM:  BP 116/72   Pulse 88   Temp 97.6 F (36.4 C) (Oral)   Resp 16   Ht 5\' 1"  (1.549 m)   Wt 56.3 kg   SpO2 99%   BMI 23.45 kg/m   Awake, alert, oriented  Speech fluent, appropriate  CN grossly intact  Good strength BUE/BLE  IMPRESSION:  65 y.o. male s/p preop SRS and resection of right parietal lung metastasis, at pre-op baseline.  PLAN: - Stable to transfer to CIR from neurosurgical standpoint when bed available - Cont Keppra - Can transition to dexamethasone 2mg  PO BID upon discharge   Consuella Lose, MD [facility]

## 2020-09-03 NOTE — Progress Notes (Signed)
Occupational Therapy Treatment Patient Details Name: [name] [name] MRN: [id number] DOB: 02-22-1956 Today's Date: 09/03/2020    History of present illness 65 y.o. male who presented 08/22/20 with R-sided weakness and numbness. Noncontrast head CT concerning for new 3.1 cm mass lesion involving the left posterior parietal convexity with large area of surrounding vasogenic edema and associated mass-effect. This is consistent with metastatic disease. S/p stereotactic L frontoparietal craniotomy for resection of tumor 4/14. PMH: HTN, anemia, bullous emphysema, COPD, and non-small cell lung cancer status post chemoradiation and now on immunotherapy.   OT comments  Upon arrival, pt just finishing with PT and very agreeable to OT. Reviewing LB dressing safety and recommending to sit for donning clothing. Administered the Pill Box Test to further assess executive functioning through an IADL. Pt failed the assessment, demonstrating poor planning, mental flexibility, suboptimal search strategies, concrete thinking and the inability to multitask. Pt had a total of _24_errors, where more than 3 errors is considered a fail.  Despite this, pt very eager to review assessment and learn from mistakes. Pt verbalizing awareness of errors but unable to identify them; requiring Mod-Max cues to recognize errors and then correct them.  Continue to recommend dc to CIR to facilitate pt reaching Mod I level and return to home as PTA. Will continue to follow acutely as admitted.   Errors: One tablet 3x/day (yellow) - _7_ errors (misplacement) One tablet 2x/day with breakfast and dinner (green) - _7_ errors (omission) One tablet in the morning (Blue)- _0_ errors One tablet daily at bedtime (orange) - _7_ errors(misplacement) One tablet every other day (red) - _3_ errors (addition)   Number of total errors - _24_ Total time to complete task (allowed 5 min) - _10_ min   Follow Up Recommendations  CIR    Equipment  Recommendations  3 in 1 bedside commode    Recommendations for Other Services Rehab consult;Speech consult    Precautions / Restrictions Precautions Precautions: Fall Precaution Comments: R sided weakness       Mobility Bed Mobility               General bed mobility comments: Pt was OOB in the recliner chair.    Transfers Overall transfer level: Needs assistance Equipment used: Rolling walker (2 wheeled);None Transfers: Sit to/from Stand Sit to Stand: Min guard         General transfer comment: min guard assist for safety during transitions, cues for safe hand placement.    Balance Overall balance assessment: Needs assistance Sitting-balance support: Feet supported;No upper extremity supported Sitting balance-Leahy Scale: Good     Standing balance support: Single extremity supported;No upper extremity supported;Bilateral upper extremity supported Standing balance-Leahy Scale: Fair Standing balance comment: close supervision in static standing, dynamic task of standing to wash privates required min guard assist as pt was leaning bil lower legs hard on the recliner for support and the recliner was slowly moving away from the patient.                           ADL either performed or assessed with clinical judgement   ADL Overall ADL's : Needs assistance/impaired                       Lower Body Dressing Details (indicate cue type and reason): Pt just finishing PT. Reports they discussed sitting to don underwear and pants. Agreed and recommended pt sit for LB dressing. Reviewed education on  this topic with patient.               General ADL Comments: Administered the Pill Box Test. See cognition section.     Vision       Perception     Praxis      Cognition Arousal/Alertness: Awake/alert Behavior During Therapy: WFL for tasks assessed/performed Overall Cognitive Status: Impaired/Different from baseline Area of Impairment:  Memory;Following commands;Safety/judgement;Awareness;Problem solving                   Current Attention Level: Selective Memory: Decreased recall of precautions;Decreased short-term memory Following Commands: Follows one step commands consistently;Follows multi-step commands inconsistently;Follows multi-step commands with increased time Safety/Judgement: Decreased awareness of safety;Decreased awareness of deficits Awareness: Intellectual;Emergent Problem Solving: Slow processing;Difficulty sequencing;Requires verbal cues General Comments: Administered Pill Box Test to further assess cognition and executive functioning. Pt placing all the pills into morning and noon sections. Reporting "I know I got some of this wrong, but I dont know which ones." Pt very eager to review assessment and learn from mistakes.        Exercises Exercises: General Lower Extremity General Exercises - Lower Extremity Ankle Circles/Pumps: AROM;Both;10 reps Long Arc Quad: AROM;Both;10 reps Hip Flexion/Marching: AROM;Both;10 reps Toe Raises: AROM;Both;10 reps Heel Raises: AROM;Both;10 reps Mini-Sqauts: AROM;Both;10 reps   Shoulder Instructions       General Comments      Pertinent Vitals/ Pain       Pain Assessment: No/denies pain  Home Living                                          Prior Functioning/Environment              Frequency  Min 2X/week        Progress Toward Goals  OT Goals(current goals can now be found in the care plan section)  Progress towards OT goals: Progressing toward goals  Acute Rehab OT Goals Patient Stated Goal: to improve and return home OT Goal Formulation: With patient Time For Goal Achievement: 09/08/20 Potential to Achieve Goals: Good ADL Goals Pt Will Perform Lower Body Bathing: with modified independence;sit to/from stand Pt Will Perform Lower Body Dressing: with modified independence;sit to/from stand Pt Will Transfer to  Toilet: with modified independence;ambulating Additional ADL Goal #1: Pt will complete 3 step trail making task in moderately distracting environment with minimal redirectional cues Additional ADL Goal #2: Pt will complete HEP with RUE with min vc to increase functional use RUE  Plan Discharge plan remains appropriate    Co-evaluation                 AM-PAC OT "6 Clicks" Daily Activity     Outcome Measure   Help from another person eating meals?: A Little Help from another person taking care of personal grooming?: A Little Help from another person toileting, which includes using toliet, bedpan, or urinal?: A Little Help from another person bathing (including washing, rinsing, drying)?: A Little Help from another person to put on and taking off regular upper body clothing?: A Little Help from another person to put on and taking off regular lower body clothing?: A Little 6 Click Score: 18    End of Session Equipment Utilized During Treatment: Gait belt;Rolling walker  OT Visit Diagnosis: Unsteadiness on feet (R26.81);Other abnormalities of gait and mobility (R26.89);Muscle weakness (generalized) (M62.81);Apraxia (R48.2);Other symptoms and  signs involving cognitive function;Hemiplegia and hemiparesis Hemiplegia - Right/Left: Right Hemiplegia - dominant/non-dominant: Non-Dominant Hemiplegia - caused by: Unspecified   Activity Tolerance Patient tolerated treatment well   Patient Left in chair;with call bell/phone within reach;with chair alarm set   Nurse Communication Mobility status        Time: 1761-6073 OT Time Calculation (min): 27 min  Charges: OT General Charges $OT Visit: 1 Visit OT Treatments $Cognitive Funtion inital: Initial 15 mins $Cognitive Funtion additional: Additional15 mins  Ceonna Frazzini MSOT, OTR/L Acute Rehab Pager: [phone] Office: [place] 09/03/2020, 5:07 PM

## 2020-09-03 NOTE — Progress Notes (Signed)
Inpatient Rehab Admissions Coordinator:   Met with patient at bedside for ongoing discussion regarding potential CIR admission. Pt. Stated interest, wants to return home alone. I placed a call with request for callback with Vickii Chafe, Pt.'s friend who is involved in his care, to discuss potential support at discharge. [facility] team will continue to follow for potential admission (if abe to identify a disposition) pending bed availability and medical readiness.   Clemens Catholic, [place], [place] Admissions Coordinator  [phone] ([place]) [phone] (office)

## 2020-09-03 NOTE — Progress Notes (Signed)
Physical Therapy Treatment Patient Details Name: [name] [name] MRN: [id number] DOB: 1955-10-23 Today's Date: 09/03/2020    History of Present Illness Pt is a 65 y.o. male who presented 08/22/20 with R-sided weakness and numbness. Noncontrast head CT concerning for new 3.1 cm mass lesion involving the left posterior parietal convexity with large area of surrounding vasogenic edema and associated mass-effect. This is consistent with metastatic disease. S/p stereotactic L frontoparietal craniotomy for resection of tumor 4/14. PMH: HTN, anemia, bullous emphysema, COPD, and non-small cell lung cancer status post chemoradiation and now on immunotherapy.    PT Comments    Pt was able to progress gait further down the hallway, participate in seated/standing exercise program, and working on seated and standing balance during functional activity of sponge bathing himself.  Cues for safety and awareness of deficits related to safe mobility.  He would benefit from more practice with and without RW next session. PT will continue to follow acutely for safe mobility progression.   Follow Up Recommendations  CIR     Equipment Recommendations  Rolling walker with 5" wheels;3in1 (PT)    Recommendations for Other Services       Precautions / Restrictions Precautions Precautions: Fall Precaution Comments: R sided weakness    Mobility  Bed Mobility               General bed mobility comments: Pt was OOB in the recliner chair.    Transfers Overall transfer level: Needs assistance Equipment used: Rolling walker (2 wheeled);None Transfers: Sit to/from Stand Sit to Stand: Min guard         General transfer comment: min guard assist for safety during transitions, cues for safe hand placement.  Ambulation/Gait Ambulation/Gait assistance: Min assist;Min guard Gait Distance (Feet): 85 Feet (x2 with 5 min seated rest break.) Assistive device: Rolling walker (2 wheeled);None;1 person hand held  assist Gait Pattern/deviations: Step-to pattern;Decreased step length - right;Decreased stride length;Decreased dorsiflexion - right;Trunk flexed;Step-through pattern     General Gait Details: Ambulated most of the way with RW, decreased brisk progression of R LE forward, min guard assist for balance with RW min hand held assist to walk the last 20' without RW.   Stairs             Wheelchair Mobility    Modified Rankin (Stroke Patients Only)       Balance Overall balance assessment: Needs assistance Sitting-balance support: Feet supported;No upper extremity supported Sitting balance-Leahy Scale: Good     Standing balance support: Single extremity supported;No upper extremity supported;Bilateral upper extremity supported Standing balance-Leahy Scale: Fair Standing balance comment: close supervision in static standing, dynamic task of standing to wash privates required min guard assist as pt was leaning bil lower legs hard on the recliner for support and the recliner was slowly moving away from the patient.                            Cognition Arousal/Alertness: Awake/alert Behavior During Therapy: WFL for tasks assessed/performed Overall Cognitive Status: Impaired/Different from baseline Area of Impairment: Memory;Following commands;Safety/judgement;Awareness                   Current Attention Level: Selective Memory: Decreased recall of precautions;Decreased short-term memory Following Commands: Follows one step commands consistently;Follows multi-step commands inconsistently;Follows multi-step commands with increased time Safety/Judgement: Decreased awareness of safety;Decreased awareness of deficits Awareness: Emergent Problem Solving: Slow processing;Difficulty sequencing;Requires verbal cues General Comments: Pt reports 2012 when  asked the year.  When questioned if this was accurate, he corrected to 2022.  Cues for safety as pt wanted to stand to  put his underwear on after bathing, PT encouraged him to sit as it was safer given his balance deficits.      Exercises General Exercises - Lower Extremity Ankle Circles/Pumps: AROM;Both;10 reps Long Arc Quad: AROM;Both;10 reps Hip Flexion/Marching: AROM;Both;10 reps Toe Raises: AROM;Both;10 reps Heel Raises: AROM;Both;10 reps Mini-Sqauts: AROM;Both;10 reps    General Comments        Pertinent Vitals/Pain Pain Assessment: No/denies pain    Home Living                      Prior Function            PT Goals (current goals can now be found in the care plan section) Acute Rehab PT Goals Patient Stated Goal: to improve and return home Progress towards PT goals: Progressing toward goals    Frequency    Min 4X/week      PT Plan Current plan remains appropriate    Co-evaluation              AM-PAC PT "6 Clicks" Mobility   Outcome Measure  Help needed turning from your back to your side while in a flat bed without using bedrails?: None Help needed moving from lying on your back to sitting on the side of a flat bed without using bedrails?: None Help needed moving to and from a bed to a chair (including a wheelchair)?: A Little Help needed standing up from a chair using your arms (e.g., wheelchair or bedside chair)?: A Little Help needed to walk in hospital room?: A Little Help needed climbing 3-5 steps with a railing? : A Little 6 Click Score: 20    End of Session Equipment Utilized During Treatment: Gait belt Activity Tolerance: Patient tolerated treatment well Patient left: in chair;with call bell/phone within reach;with chair alarm set;Other (comment) (with OT entering the room) Nurse Communication: Mobility status PT Visit Diagnosis: Unsteadiness on feet (R26.81);Other abnormalities of gait and mobility (R26.89);Muscle weakness (generalized) (M62.81);Difficulty in walking, not elsewhere classified (R26.2);Other symptoms and signs involving the  nervous system (R29.898);Hemiplegia and hemiparesis Hemiplegia - Right/Left: Right Hemiplegia - dominant/non-dominant: Non-dominant Hemiplegia - caused by:  (tumor resection)     Time: 3734-2876 PT Time Calculation (min) (ACUTE ONLY): 38 min  Charges:  $Gait Training: 23-37 mins $Therapeutic Activity: 8-22 mins                     Verdene Lennert, PT, DPT  Acute Rehabilitation [phone] pager [phone]) [phone] office

## 2020-09-03 NOTE — Consult Note (Signed)
Physical Medicine and Rehabilitation Consult Reason for Consult: Right side weakness and numbness Referring Physician: Dr.Fang Erlinda Hong   HPI: [name] [name] is a 65 y.o. right-handed male with history of COPD/tobacco use, hypertension, iron deficiency anemia, non-small cell lung cancer, adenocarcinoma diagnosed August 2021 status post chemoradiation now on immunotherapy followed by Dr. Earlie Server.  Per chart review patient lives alone.  Reportedly independent prior to admission.  1 level apartment.  He takes public transportation or walks to the store.  He does have multiple church members who check on him.  Patient has a sister in [place] and a brother in [place].  Presented 08/22/2020 with right arm and leg numbness and weakness.  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  Underwent stereotactic radiosurgery 08/30/2020 per Dr. Kathyrn Sheriff.  He continues to be followed by oncology/Dr. Earlie Server services and awaiting plan of care.  Patient maintained on Decadron protocol.  Keppra ongoing for seizure prophylaxis.  Tolerating a regular diet.  Therapy evaluations completed due to patient's decreased functional mobility recommendations of physical medicine rehab consult. Patient with some speech difficulties, word finding  Review of Systems  Constitutional: Negative for chills and fever.  HENT: Negative for hearing loss.   Eyes: Negative for blurred vision and double vision.  Respiratory: Positive for shortness of breath.   Cardiovascular: Negative for chest pain, palpitations and leg swelling.  Gastrointestinal: Positive for constipation. Negative for heartburn, nausea and vomiting.  Genitourinary: Negative for dysuria, flank pain and hematuria.  Musculoskeletal: Positive for myalgias.  Skin: Negative for rash.  Neurological: Positive for sensory change and weakness.  All other  systems reviewed and are negative.  Past Medical History:  Diagnosis Date  . 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[facility]) 12/25/2019  . COPD (chronic obstructive pulmonary disease) ([place]) 11/02/2007   Qualifier: Diagnosis of  By: Melvyn Novas MD, Christena Deem   . Hypertension 12/24/2019  . Iron deficiency anemia 08/23/2007   Qualifier: Diagnosis of  By: Jim Like     Past Surgical History:  Procedure Laterality Date  . APPLICATION OF CRANIAL NAVIGATION N/A 08/30/2020   Procedure: APPLICATION OF CRANIAL NAVIGATION;  Surgeon: Consuella Lose, MD;  Location: [facility];  Service: Neurosurgery;  Laterality: N/A;  . CRANIOTOMY Left 08/30/2020   Procedure: Stereotactic left frontoparietal craniectomy for resection of tumor with brainlab;  Surgeon: Consuella Lose, MD;  Location: [facility];  Service: Neurosurgery;  Laterality: Left;  . ENDOBRONCHIAL ULTRASOUND N/A 12/27/2019   Procedure: ENDOBRONCHIAL ULTRASOUND;  Surgeon: Laurin Coder, MD;  Location: [facility];  Service: Endoscopy;  Laterality: N/A;  . FINE NEEDLE ASPIRATION  12/27/2019   Procedure: FINE NEEDLE ASPIRATION;  Surgeon: Laurin Coder, MD;  Location: [facility];  Service: Endoscopy;;  . VIDEO BRONCHOSCOPY N/A 12/27/2019   Procedure: VIDEO BRONCHOSCOPY WITHOUT FLUORO;  Surgeon: Laurin Coder, MD;  Location: [facility];  Service: Endoscopy;  Laterality: N/A;   Family History  Problem Relation Age of Onset  . Hypertension Other    Social History:  reports that he has quit smoking. His smoking use included cigarettes. He smoked 0.50 packs per day. He has never used smokeless tobacco. He reports previous alcohol use. He reports that he does not use drugs. Allergies:  Allergies  Allergen Reactions  . Other Itching and Other (See Comments)    Seasonal allergies- Itchy eyes, runny nose, sneezing, scratchy throat   Medications Prior to Admission  Medication Sig Dispense Refill  . acetaminophen (TYLENOL) 500 MG tablet Take 500 mg by mouth every 6  (six) hours as needed for mild pain or moderate pain.    Marland Kitchen aspirin 81 MG EC tablet Take 1 tablet (81 mg total) by mouth daily. 30 tablet 11  . budesonide-formoterol (SYMBICORT) 80-4.5 MCG/ACT inhaler Inhale 2 puffs into the lungs 2 (two) times daily. (Patient taking differently: Inhale 2 puffs into the lungs 2 (two) times daily as needed (for flares of wheezing and/or shortness of breath).) 1 Inhaler 11  . feeding supplement (ENSURE ENLIVE / ENSURE PLUS) LIQD Take 237 mLs by mouth 2 (two) times daily between meals. 237 mL 12  . Multiple Vitamin (MULTIVITAMIN) tablet Take 1 tablet by mouth daily.    . prochlorperazine (COMPAZINE) 10 MG tablet Take 1 tablet (10 mg total) by mouth every 6 (six) hours as needed for nausea or vomiting. 30 tablet 0  . senna-docusate (SENOKOT-S) 8.6-50 MG tablet Take 1 tablet by mouth at bedtime. (Patient taking differently: Take 1 tablet by mouth at bedtime as needed for mild constipation.)    . SPIRIVA HANDIHALER 18 MCG inhalation capsule Place 1 capsule into inhaler and inhale daily.    . sucralfate (CARAFATE) 1 g tablet Take 1 tablet (1 g total) by mouth 4 (four) times daily. Dissolve each tablet in 15 cc water before use. (Patient taking differently: Take 1 g by mouth See admin instructions. Dissolve 1 gram (1 tablet) in 15 ml's (1 TABLESPOONFUL) of water before taking- FOUR times a day) 120 tablet 2  . VENTOLIN HFA 108 (90 Base) MCG/ACT inhaler Inhale 2 puffs into the lungs every 4 (four) hours as needed for wheezing or shortness of breath.      Home: Home Living Family/patient expects to be discharged to:: Private residence Living Arrangements: Alone Available Help at Discharge: Neighbor,Available PRN/intermittently,Friend(s) (preacher stops by every now and then; family lives in [place], unsure if his sister could come stay with him initially) Type of Home: Apartment Home Access: Level entry Home Layout: One level Bathroom Shower/Tub: Scientist, forensic: Standard Bathroom Accessibility: Yes Home Equipment: Grab bars - tub/shower  Functional History: Prior Function Level of Independence: Independent Comments: Pt takes public transportation or walks to stores, he does not drive. Pt independent with all ADLs and mobility without AD/AE. No falls in past 6 months prior to onset of symptoms. Functional Status:  Mobility: Bed Mobility Overal bed mobility: Modified Independent General bed mobility comments: Pt sitting up in recliner upon arrival. Transfers Overall transfer level: Needs assistance Equipment used: Rolling walker (2 wheeled),None Transfers: Sit to/from Stand Sit to Stand: Min guard General transfer comment: for safety, verbal and tactile cuing for proper hand placement when rising/sitting Ambulation/Gait Ambulation/Gait assistance: Min assist Gait Distance (Feet): 100 Feet (x2 bouts of ~20 ft > ~100 ft) Assistive device: Rolling walker (2 wheeled),None Gait Pattern/deviations: Step-to pattern,Decreased step length - right,Decreased stride length,Decreased dorsiflexion - right,Trunk flexed,Step-through pattern General Gait Details: Initially attempting without UE support, but pt tends to lean to R and display R lateral imbalance and R foot drag with significantly slow gait speed. Thus, provided pt with RW on second bout with improved upright posture, but still tends to drift to R and bump into obstacles. Cues provided to remain more midline in RW and hallway and cues to increase R step length and foot clearance, with momentary success. MinA for stability and safety. Gait velocity: reduced Gait velocity interpretation: <1.31 ft/sec, indicative of  household ambulator    ADL: ADL Overall ADL's : Needs assistance/impaired Eating/Feeding: Set up Eating/Feeding Details (indicate cue type and reason): difficulty cutting food; drops utensils; difficuulty maintaining grasp on cups Grooming: Oral care,Min  guard,Supervision/safety,Standing Grooming Details (indicate cue type and reason): Decreased in hand manipulation. Difficulty opening containers and requiring increased time. Upper Body Bathing: Set up,Supervision/ safety,Sitting Lower Body Bathing: Minimal assistance,Sit to/from stand Upper Body Dressing : Set up,Supervision/safety,Sitting Lower Body Dressing: Minimal assistance,Sit to/from stand Toilet Transfer: Min guard,Ambulation,RW (simulated to recliner) Toilet Transfer Details (indicate cue type and reason): Min guard A for safety Toileting- Clothing Manipulation and Hygiene: Min guard,Sit to/from stand Functional mobility during ADLs: Rolling walker,Min guard General ADL Comments: Pt continues ot present with decreased cognition. Very motivated.  Cognition: Cognition Overall Cognitive Status: Impaired/Different from baseline Orientation Level: Oriented X4 Cognition Arousal/Alertness: Awake/alert Behavior During Therapy: WFL for tasks assessed/performed Overall Cognitive Status: Impaired/Different from baseline Area of Impairment: Attention,Safety/judgement,Problem solving,Awareness Current Attention Level: Selective Following Commands: Follows one step commands consistently,Follows multi-step commands inconsistently Safety/Judgement: Decreased awareness of safety,Decreased awareness of deficits Awareness: Emergent Problem Solving: Slow processing,Difficulty sequencing,Requires verbal cues General Comments: Very motivated to participate in therapy and responsive to education and cues. Pt contineus to present with decreased awareness and problem solving requiring cues. Pt with poor awareness into his deficits to drift to the R and needs cues to correct often.  Blood pressure 107/64, pulse 76, temperature 97.9 F (36.6 C), temperature source Oral, resp. rate 14, height 5\' 1"  (1.549 m), weight 56.3 kg, SpO2 97 %. Physical Exam Vitals and nursing note reviewed.  Constitutional:       Appearance: He is normal weight.  Eyes:     Extraocular Movements: Extraocular movements intact.     Conjunctiva/sclera: Conjunctivae normal.     Pupils: Pupils are equal, round, and reactive to light.  Cardiovascular:     Rate and Rhythm: Normal rate and regular rhythm.     Heart sounds: Normal heart sounds.  Pulmonary:     Effort: Pulmonary effort is normal. No respiratory distress.     Breath sounds: Normal breath sounds. No stridor.  Abdominal:     General: Abdomen is flat. Bowel sounds are normal.     Palpations: Abdomen is soft.     Tenderness: There is no abdominal tenderness.  Musculoskeletal:     Cervical back: Normal range of motion and neck supple. No tenderness.     Comments: No pain with upper limb or lower rim range of motion No joint swelling  Skin:    General: Skin is warm and dry.  Neurological:     Mental Status: He is alert and oriented to person, place, and time.     Cranial Nerves: Cranial nerves are intact. No dysarthria.     Motor: No weakness.     Coordination: Coordination abnormal.     Comments: Patient is alert no acute distress.  Makes eye contact with examiner.  Follows simple commands.  Provides name and age.  He did have some difficulty recalling his hospital course.  Decreased finger to thumb opposition right upper extremity Motor strength is 4 - in the right deltoid bicep tricep grip right hip flexor knee extensor ankle dorsiflexion 5/5 in the left deltoid bicep tricep grip hip flexion knee extension ankle dorsiflexion  Psychiatric:        Mood and Affect: Mood normal.        Behavior: Behavior normal.     No results found for this or  any previous visit (from the past 24 hour(s)). No results found.  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 Does the need for close, 24 hr/day medical supervision in concert with the patient's rehab needs make it unreasonable for this patient to be served in a  less intensive setting? Yes 2. Co-Morbidities requiring supervision/potential complications: Non-small cell lung carcinoma 3. Due to bladder management, bowel management, safety, skin/wound care, disease management, medication administration, pain management and patient education, does the patient require 24 hr/day rehab nursing? Yes 4. Does the patient require coordinated care of a physician, rehab nurse, therapy disciplines of PT, OT, speech to address physical and functional deficits in the context of the above medical diagnosis(es)? Yes Addressing deficits in the following areas: balance, endurance, locomotion, strength, transferring, bathing, dressing, toileting, language and psychosocial support 5. Can the patient actively participate in an intensive therapy program of at least 3 hrs of therapy per day at least 5 days per week? Yes 6. The potential for patient to make measurable gains while on inpatient rehab is good 7. Anticipated functional outcomes upon discharge from inpatient rehab are modified independent and supervision  with PT, modified independent and supervision with OT, modified independent with SLP. 8. Estimated rehab length of stay to reach the above functional goals is: 7 to 10 days 9. Anticipated discharge destination: Home 10. Overall Rehab/Functional Prognosis: good  RECOMMENDATIONS: This patient's condition is appropriate for continued rehabilitative care in the following setting: CIR Patient has agreed to participate in recommended program. Yes Note that insurance prior authorization may be required for reimbursement for recommended care.  Comment: Need to establish whether patient will get postop radiation or other treatments for underlying carcinoma   Cathlyn Parsons, PA-C 09/03/2020

## 2020-09-04 ENCOUNTER — Telehealth: Payer: Self-pay | Admitting: Medical Oncology

## 2020-09-04 LAB — SURGICAL PATHOLOGY

## 2020-09-04 NOTE — Progress Notes (Deleted)
[facility] OFFICE PROGRESS NOTE  [name], NP [address] [place] [address]  DIAGNOSIS: ***  PRIOR THERAPY:  CURRENT THERAPY:  INTERVAL HISTORY: [name] [name] 65 y.o. male returns for *** regular *** visit for followup of ***   MEDICAL HISTORY: Past Medical History:  Diagnosis Date  . 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[place]) 12/25/2019  . COPD (chronic obstructive pulmonary disease) ([place]) 11/02/2007   Qualifier: Diagnosis of  By: Melvyn Novas MD, Christena Deem   . Hypertension 12/24/2019  . Iron deficiency anemia 08/23/2007   Qualifier: Diagnosis of  By: Jim Like      ALLERGIES:  is allergic to other.  MEDICATIONS:  No current facility-administered medications for this visit.   No current outpatient medications on file.   Facility-Administered Medications Ordered in Other Visits  Medication Dose Route Frequency Provider Last Rate Last Admin  . acetaminophen (TYLENOL) tablet 650 mg  650 mg Oral Q4H PRN Traci Sermon, PA-C   650 mg at 09/04/20 0730   Or  . acetaminophen (TYLENOL) suppository 650 mg  650 mg Rectal Q4H PRN Costella, Vincent J, PA-C      . albuterol (VENTOLIN HFA) 108 (90 Base) MCG/ACT inhaler 2 puff  2 puff Inhalation Q4H PRN Opyd, Ilene Qua, MD      . bisacodyl (DULCOLAX) EC tablet 5 mg  5 mg Oral Daily PRN Costella, Vista Mink, PA-C   5 mg at 09/01/20 0906  . Chlorhexidine Gluconate Cloth 2 % PADS 6 each  6 each Topical Daily Consuella Lose, MD   6 each at 09/02/20 0905  . dexamethasone (DECADRON) tablet 4 mg  4 mg Oral Q12H Ashok Pall, MD   4 mg at 09/04/20 8466  . HYDROcodone-acetaminophen (NORCO/VICODIN) 5-325 MG per tablet 1 tablet  1 tablet Oral Q4H PRN Traci Sermon, PA-C   1 tablet at 09/01/20 2221  . HYDROmorphone (DILAUDID) injection 0.5-1 mg  0.5-1 mg Intravenous Q2H PRN Costella, Vincent J, PA-C   0.5 mg at 08/31/20 1038  . labetalol (NORMODYNE) injection 10-40 mg  10-40 mg Intravenous Q10 min PRN Costella, Vista Mink, PA-C      . levETIRAcetam (KEPPRA) tablet 500 mg  500 mg Oral BID Florencia Reasons, MD   500 mg at 09/04/20 5993  . LORazepam (ATIVAN) injection 1 mg  1 mg Intravenous Once PRN Opyd, Ilene Qua, MD      . mometasone-formoterol (DULERA) 100-5 MCG/ACT inhaler 2 puff  2 puff Inhalation BID Opyd, Ilene Qua, MD   2 puff at 09/04/20 0833  . naloxone [facility]) injection 0.08 mg  0.08 mg Intravenous PRN Costella, Vista Mink, PA-C      . ondansetron (ZOFRAN) tablet 4 mg  4 mg Oral Q4H PRN Costella, Vista Mink, PA-C       Or  . ondansetron (ZOFRAN) injection 4 mg  4 mg Intravenous Q4H PRN Costella, Vincent J, PA-C      . pantoprazole (PROTONIX) EC tablet 40 mg  40 mg Oral Q supper Alvira Philips, RPH   40 mg at 09/03/20 1651  . polyethylene glycol (MIRALAX / GLYCOLAX) packet 17 g  17 g Oral Daily PRN Costella, Vista Mink, PA-C      . promethazine (PHENERGAN) tablet 12.5-25 mg  12.5-25 mg Oral Q4H PRN Costella, Vista Mink, PA-C      . senna (SENOKOT) tablet 8.6 mg  1 tablet Oral BID Costella, Vincent J, PA-C   8.6 mg at 09/04/20 5701  . sodium phosphate (FLEET) 7-19  GM/118ML enema 1 enema  1 enema Rectal Once PRN Costella, Vista Mink, PA-C      . umeclidinium bromide (INCRUSE ELLIPTA) 62.5 MCG/INH 1 puff  1 puff Inhalation Daily Opyd, Ilene Qua, MD   1 puff at 09/04/20 5573    SURGICAL HISTORY:  Past Surgical History:  Procedure Laterality Date  . APPLICATION OF CRANIAL NAVIGATION N/A 08/30/2020   Procedure: APPLICATION OF CRANIAL NAVIGATION;  Surgeon: Consuella Lose, MD;  Location: [facility];  Service: Neurosurgery;  Laterality: N/A;  . CRANIOTOMY Left 08/30/2020   Procedure: Stereotactic left frontoparietal craniectomy for resection of tumor with brainlab;  Surgeon: Consuella Lose, MD;  Location: [facility];  Service: Neurosurgery;  Laterality: Left;  . ENDOBRONCHIAL ULTRASOUND N/A 12/27/2019   Procedure: ENDOBRONCHIAL ULTRASOUND;  Surgeon: Laurin Coder, MD;  Location: [facility];  Service: Endoscopy;   Laterality: N/A;  . FINE NEEDLE ASPIRATION  12/27/2019   Procedure: FINE NEEDLE ASPIRATION;  Surgeon: Laurin Coder, MD;  Location: [facility];  Service: Endoscopy;;  . VIDEO BRONCHOSCOPY N/A 12/27/2019   Procedure: VIDEO BRONCHOSCOPY WITHOUT FLUORO;  Surgeon: Laurin Coder, MD;  Location: [facility];  Service: Endoscopy;  Laterality: N/A;    REVIEW OF SYSTEMS:   Review of Systems  Constitutional: Negative for appetite change, chills, fatigue, fever and unexpected weight change.  HENT:   Negative for mouth sores, nosebleeds, sore throat and trouble swallowing.   Eyes: Negative for eye problems and icterus.  Respiratory: Negative for cough, hemoptysis, shortness of breath and wheezing.   Cardiovascular: Negative for chest pain and leg swelling.  Gastrointestinal: Negative for abdominal pain, constipation, diarrhea, nausea and vomiting.  Genitourinary: Negative for bladder incontinence, difficulty urinating, dysuria, frequency and hematuria.   Musculoskeletal: Negative for back pain, gait problem, neck pain and neck stiffness.  Skin: Negative for itching and rash.  Neurological: Negative for dizziness, extremity weakness, gait problem, headaches, light-headedness and seizures.  Hematological: Negative for adenopathy. Does not bruise/bleed easily.  Psychiatric/Behavioral: Negative for confusion, depression and sleep disturbance. The patient is not nervous/anxious.     PHYSICAL EXAMINATION:  There were no vitals taken for this visit.  ECOG PERFORMANCE STATUS: {CHL ONC ECOG [id number]  Physical Exam  Constitutional: Oriented to person, place, and time and well-developed, well-nourished, and in no distress. No distress.  HENT:  Head: Normocephalic and atraumatic.  Mouth/Throat: Oropharynx is clear and moist. No oropharyngeal exudate.  Eyes: Conjunctivae are normal. Right eye exhibits no discharge. Left eye exhibits no discharge. No scleral icterus.  Neck: Normal range of  motion. Neck supple.  Cardiovascular: Normal rate, regular rhythm, normal heart sounds and intact distal pulses.   Pulmonary/Chest: Effort normal and breath sounds normal. No respiratory distress. No wheezes. No rales.  Abdominal: Soft. Bowel sounds are normal. Exhibits no distension and no mass. There is no tenderness.  Musculoskeletal: Normal range of motion. Exhibits no edema.  Lymphadenopathy:    No cervical adenopathy.  Neurological: Alert and oriented to person, place, and time. Exhibits normal muscle tone. Gait normal. Coordination normal.  Skin: Skin is warm and dry. No rash noted. Not diaphoretic. No erythema. No pallor.  Psychiatric: Mood, memory and judgment normal.  Vitals reviewed.  LABORATORY DATA: Lab Results  Component Value Date   WBC 10.7 (H) 09/01/2020   HGB 11.1 (L) 09/01/2020   HCT 33.7 (L) 09/01/2020   MCV 79.5 (L) 09/01/2020   PLT 198 09/01/2020      Chemistry      Component Value Date/Time   NA  134 (L) 09/01/2020 0153   NA 144 08/03/2019 1527   K 4.0 09/01/2020 0153   CL 103 09/01/2020 0153   CO2 24 09/01/2020 0153   BUN 14 09/01/2020 0153   BUN 9 08/03/2019 1527   CREATININE 0.69 09/01/2020 0153   CREATININE 0.82 07/30/2020 1306      Component Value Date/Time   CALCIUM 8.6 (L) 09/01/2020 0153   ALKPHOS 50 08/22/2020 1411   AST 22 08/22/2020 1411   AST 16 07/30/2020 1306   ALT 13 08/22/2020 1411   ALT 9 07/30/2020 1306   BILITOT 0.4 08/22/2020 1411   BILITOT 0.3 07/30/2020 1306       RADIOGRAPHIC STUDIES:  X-ray chest PA or AP  Result Date: 08/30/2020 CLINICAL DATA:  Central line placement EXAM: CHEST  1 VIEW COMPARISON:  08/23/2020 FINDINGS: The heart size and mediastinal contours are within normal limits. There is a left chest vascular catheter, tip directed into the internal jugular and not imaged at the superior extent of the exam. Bullous change of the left apex, unchanged. The visualized skeletal structures are unremarkable.  IMPRESSION: 1. There is a malpositioned left chest vascular catheter, tip directed into the internal jugular and not imaged at the superior extent of the exam. 2. No acute abnormality of the lungs. These results will be called to the ordering clinician or representative by the Radiologist Assistant, and communication documented in the PACS or [employer]. Electronically Signed   By: Eddie Candle M.D.   On: 08/30/2020 17:40   CT Head Wo Contrast  Result Date: 08/23/2020 CLINICAL DATA:  Acute neurological deficit.  Stroke suspected. EXAM: CT HEAD WITHOUT CONTRAST TECHNIQUE: Contiguous axial images were obtained from the base of the skull through the vertex without intravenous contrast. COMPARISON:  12/28/2019 FINDINGS: Brain: New development since previous study of a mass lesion in the left posterior parietal convexity. The mass measures 2.4 x 2.4 x 3.1 cm in diameter. There is a large area of surrounding vasogenic edema throughout the left parietal lobe. There is associated mass effect with effacement of the left lateral ventricles and of the left parietal temporal sulci. No significant midline shift. Underlying cerebral atrophy. No ventricular dilatation. No acute intracranial hemorrhage. 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 Skull: Postoperative changes with right frontal and temporal craniotomy. No acute depressed skull fractures. Sinuses/Orbits: Paranasal sinuses are clear. Bilateral mastoid effusions. Other: None. IMPRESSION: 1. New development of a 3.1 cm mass lesion in the left posterior parietal convexity with a large area of surrounding vasogenic edema and associated mass effect. This is consistent with metastatic disease. 2. No acute intracranial hemorrhage. 3. Postoperative changes with right frontal and temporal craniotomy. 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 4. Bilateral mastoid effusions. Electronically Signed   By: Lucienne Capers M.D.   On: 08/23/2020 01:18    CT HEAD W CONTRAST  Result Date: 08/23/2020 CLINICAL DATA:  Acute neurological deficit. Intracranial mass lesion. Non-small cell lung cancer. EXAM: CT HEAD WITH CONTRAST TECHNIQUE: Contiguous axial images were obtained from the base of the skull through the vertex with intravenous contrast. CONTRAST:  42mL OMNIPAQUE IOHEXOL 300 MG/ML  SOLN COMPARISON:  Noncontrast head CT earlier same day. FINDINGS: Brain: No abnormality affects the brainstem or cerebellum. 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 Volume loss of the right anterior temporal lobe. No acute parenchymal finding affects  the right hemisphere. On the left, there is an intra-axial mass lesion at parietal vertex measuring 3.2 x 2.2 x 3.0 cm. Pronounced regional vasogenic edema. Metastatic disease would be the most common cause. Primary brain tumor is possible but less likely. No second intracranial lesion is identified. Vascular: Flow is present in the major vessels. Cannot evaluate the region of the aneurysm well because artifact from the clip. Skull: Right pterional craniotomy/craniectomy as described above. Sinuses/Orbits: Clear/normal Other: None IMPRESSION: 1. 3.2 x 2.2 x 3.0 cm intra-axial mass lesion at the parietal vertex on the left with regional vasogenic edema. Metastatic disease would be the most common cause. Primary brain tumor is possible but less likely. 2. 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 Cannot evaluate the region of the aneurysm well because of artifact from the clip. Electronically Signed   By: Nelson Chimes M.D.   On: 08/23/2020 14:35   CT HEAD W & WO CONTRAST  Result Date: 08/30/2020 CLINICAL DATA:  Postop craniotomy EXAM: CT HEAD WITHOUT AND WITH CONTRAST TECHNIQUE: Contiguous axial images were obtained from the base of  the skull through the vertex without and with intravenous contrast CONTRAST:  134mL OMNIPAQUE IOHEXOL 300 MG/ML  SOLN COMPARISON:  None. FINDINGS: Brain: Large area of vasogenic edema in the left hemisphere, slightly decreased. The dense mass at the superior left convexity has been resected. There is a small amount of blood and pneumocephalus over the left hemisphere near the operative site. No nodular contrast enhancement visible at the resection site. Vascular: Remote aneurysm clipping. Skull: Old right pterional craniotomy. New superior left parietal craniotomy. Sinuses/Orbits: Negative Other: None IMPRESSION: 1. Status post resection of superior left convexity mass. Small amount of blood and pneumocephalus over the left hemisphere near the operative site. 2. Slightly decreased vasogenic edema in the left hemisphere. 3. No nodular contrast enhancement at the resection site. Electronically Signed   By: Ulyses Jarred M.D.   On: 08/30/2020 21:58   CT HEAD W & WO CONTRAST  Result Date: 08/29/2020 CLINICAL DATA:  Initial evaluation for brain mass, preoperative planning. EXAM: CT HEAD WITHOUT AND WITH CONTRAST TECHNIQUE: Contiguous axial images were obtained from the base of the skull through the vertex without and with intravenous contrast CONTRAST:  54mL OMNIPAQUE IOHEXOL 300 MG/ML  SOLN COMPARISON:  Prior head CT from 08/23/2020 FINDINGS: Brain: Again seen is a focal intraparenchymal enhancing mass centered along the gray-white matter differentiation at the left parietal convexity. Lesion measures 3.1 x 1.9 x 3.3 cm (transverse by craniocaudad by AP). Surrounding vasogenic edema throughout the posterior left frontoparietal region without significant regional mass effect. No other mass lesion or abnormal enhancement. Underlying age-related cerebral atrophy with mild chronic small vessel ischemic disease. Remote lacunar infarct present at the right caudate head. Sequelae of prior surgical clipping of aneurysm  present at the right ICA terminus. Postsurgical encephalomalacia present at the anterior right temporal pole. No acute intracranial hemorrhage. No other acute large vessel territory infarct. No hydrocephalus or extra-axial fluid collection. Vascular: No hyperdense vessel on precontrast images. Normal intravascular enhancement seen following contrast administration. Prior surgical clipping of aneurysm near the right ICA terminus. Skull: Remote right pterional craniotomy. No focal osseous lesions. Scalp soft tissues demonstrate no acute finding. Sinuses/Orbits: Globes and orbital soft tissues within normal limits. Paranasal sinuses are largely clear. Small right mastoid effusion noted, of doubtful significance. Other: None. IMPRESSION: 1. 3.1 x 1.9 x 3.3 cm enhancing mass centered  along the gray-white matter differentiation at the left parietal convexity with surrounding vasogenic edema. A solitary intracranial metastasis is favored. A primary CNS neoplasm would be the primary differential consideration. 2. 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 3. 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 Electronically Signed   By: Jeannine Boga M.D.   On: 08/29/2020 00:07   DG Chest Portable 1 View  Result Date: 08/23/2020 CLINICAL DATA:  Right arm numbness, leg numbness. EXAM: PORTABLE CHEST 1 VIEW COMPARISON:  None. FINDINGS: Chronic changes in the upper lobes with scarring and emphysema. Heart is normal size. No acute confluent opacities or effusions. No acute bony abnormality. IMPRESSION: COPD/chronic changes.  No active disease. Electronically Signed   By: Rolm Baptise M.D.   On: 08/23/2020 00:53     ASSESSMENT/PLAN:  No problem-specific Assessment & Plan notes found for this encounter.   No orders of the defined types were placed in this encounter.    I spent {CHL ONC TIME VISIT - HRCBU:[id number]} counseling the patient face to face. The total time spent in the  appointment was {CHL ONC TIME VISIT - HOZYY:[id number]}.  Angelisa Winthrop L Adel Burch, PA-C 09/04/20

## 2020-09-04 NOTE — Progress Notes (Signed)
PROGRESS NOTE Brief same day note  [name] [name]  HKV:[id number] DOB: 1955/07/08 DOA: 08/22/2020 PCP: [name], NP   Chief Complain: Right arm/right leg numbness/weakness  Brief Narrative: Patient is a 65 year old male with history of COPD, non-small cell lung cancer status post chemoradiation, now on immunotherapy, active smoker who presented to the emergency department with further evaluation of right-sided weakness.  On presentation, he was hemodynamically stable.  Noncontrast head CT showed new 3.1 cm mass lesion involving the left posterior parietal convexity with large area of surrounding vasogenic edema and associated mass-effect.  CT with contrast confirmed 3.2 x 2.2 x 3 cm intra-axial mass lesion consistent with metastasis.  Started on Keppra, Decadron. Oncology,neurooncology and neurosurgery consulted,underwent stereotactic left frontoparietal cranitomy for resection of tumor on  4/14  PT recommending inpatient rehab on discharge.  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  CT with contrast confirmed 3.2 x 2.2 x 3 cm intra-axial mass lesion consistent with metastasis.   Oncology,neurooncology and neurosurgery consulted. underwent stereotactic left frontoparietal cranitomy for resection of tumor on  4/14 Continue Decadron taper , also on Keppra 500 mg twice daily, will continue Keppra for 1 to 2 weeks postsurgery. Currently alert and oriented but was confused on presentation. He has mild weakness on the right side  Non-small cell lung cancer: Follows with Dr. Julien Nordmann, status post chemoradiation, now on chemotherapy with Imfinzi.  COPD: Currently stable, not in exacerbation.  Continue bronchodilators.  On room air.  Right-sided  weakness: PT recommended CIR on discharge.         DVT prophylaxis:Lovenox Code Status: Full Family Communication: None at bedside Status is: Inpatient   Dispo: The patient is from: Home              Anticipated d/c is to: CIR              Patient currently is medically stable for discharge   Difficult to place patient No     Consultants: Neurology  Procedures:None  Antimicrobials:  Anti-infectives (From admission, onward)   Start     Dose/Rate Route Frequency Ordered Stop   08/30/20 1800  ceFAZolin (ANCEF) IVPB 2g/100 mL premix        2 g 200 mL/hr over 30 Minutes Intravenous Every 8 hours 08/30/20 1643 08/31/20 0312   08/30/20 1315  ceFAZolin (ANCEF) IVPB 2g/100 mL premix  Status:  Discontinued        2 g 200 mL/hr over 30 Minutes Intravenous To Surgery 08/30/20 1249 08/30/20 1746      Subjective: Patient seen and examined the bedside this morning.  Hemodynamically stable denies any new complaints.  Complains of some pain on the suture area on the scalp  Objective: Vitals:   09/03/20 1941 09/03/20 2018 09/04/20 0327 09/04/20 0725  BP: 123/65  132/74 137/77  Pulse: 91 92 72 74  Resp: 20 20 16 17   Temp: (!) 97.5 F (36.4 C)   98.2 F (36.8 C)  TempSrc: Oral   Oral  SpO2: 97% 97% 99% 99%  Weight:      Height:        Intake/Output Summary (Last 24 hours) at 09/04/2020 0821 Last data filed at 09/03/2020 1000 Gross per 24 hour  Intake 240 ml  Output --  Net 240 ml   Filed Weights   08/23/20 0552  Weight: 56.3 kg    Examination:  General exam: Overall comfortable, not in distress HEENT: PERRL,suture on the scalp Respiratory system:  no wheezes or crackles  Cardiovascular system: S1 & S2 heard, RRR.  Gastrointestinal system: Abdomen is nondistended, soft and nontender. Central nervous system: Alert and oriented Extremities: No edema, no clubbing ,no cyanosis,mild right sided weakness Skin: No rashes, no ulcers,no icterus   Data Reviewed: I have  personally reviewed following labs and imaging studies  CBC: Recent Labs  Lab 08/31/20 0310 09/01/20 0153  WBC 12.0* 10.7*  NEUTROABS  --  8.7*  HGB 11.9* 11.1*  HCT 37.0* 33.7*  MCV 81.0 79.5*  PLT 236 612   Basic Metabolic Panel: Recent Labs  Lab 08/31/20 0310 09/01/20 0153  NA 132* 134*  K 4.1 4.0  CL 104 103  CO2 22 24  GLUCOSE 109* 158*  BUN 14 14  CREATININE 0.90 0.69  CALCIUM 8.4* 8.6*  MG  --  2.1   GFR: Estimated Creatinine Clearance: 69.9 mL/min (by C-G formula based on SCr of 0.69 mg/dL). Liver Function Tests: No results for input(s): AST, ALT, ALKPHOS, BILITOT, PROT, ALBUMIN in the last 168 hours. No results for input(s): LIPASE, AMYLASE in the last 168 hours. No results for input(s): AMMONIA in the last 168 hours. Coagulation Profile: No results for input(s): INR, PROTIME in the last 168 hours. Cardiac Enzymes: No results for input(s): CKTOTAL, CKMB, CKMBINDEX, TROPONINI in the last 168 hours. BNP (last 3 results) No results for input(s): PROBNP in the last 8760 hours. HbA1C: No results for input(s): HGBA1C in the last 72 hours. CBG: No results for input(s): GLUCAP in the last 168 hours. Lipid Profile: No results for input(s): CHOL, HDL, LDLCALC, TRIG, CHOLHDL, LDLDIRECT in the last 72 hours. Thyroid Function Tests: No results for input(s): TSH, T4TOTAL, FREET4, T3FREE, THYROIDAB in the last 72 hours. Anemia Panel: No results for input(s): VITAMINB12, FOLATE, FERRITIN, TIBC, IRON, RETICCTPCT in the last 72 hours. Sepsis Labs: No results for input(s): PROCALCITON, LATICACIDVEN in the last 168 hours.  No results found for this or any previous visit (from the past 240 hour(s)).       Radiology Studies: No results found.      Scheduled Meds: . Chlorhexidine Gluconate Cloth  6 each Topical Daily  . dexamethasone  4 mg Oral Q12H  . levETIRAcetam  500 mg Oral BID  . mometasone-formoterol  2 puff Inhalation BID  . pantoprazole  40 mg Oral Q  supper  . senna  1 tablet Oral BID  . umeclidinium bromide  1 puff Inhalation Daily   Continuous Infusions:    LOS: 11 days    Time spent: 25 mins.More than 50% of that time was spent in counseling and/or coordination of care.      [name], MD [facility] P4/19/2022, 8:21 AM

## 2020-09-04 NOTE — Progress Notes (Signed)
Physical Therapy Treatment Patient Details Name: [name] [name] MRN: [id number] DOB: 12/26/1955 Today's Date: 09/04/2020    History of Present Illness 65 y.o. male who presented 08/22/20 with R-sided weakness and numbness. Noncontrast head CT concerning for new 3.1 cm mass lesion involving the left posterior parietal convexity with large area of surrounding vasogenic edema and associated mass-effect. This is consistent with metastatic disease. S/p stereotactic L frontoparietal craniotomy for resection of tumor 4/14. PMH: HTN, anemia, bullous emphysema, COPD, and non-small cell lung cancer status post chemoradiation and now on immunotherapy.    PT Comments    Pt sitting upright in recliner upon arrival. He is progressing towards all goals, however continues to present with cognitive and balance impairments. He would benefit from further PT to increase strength of LE and improve balance. CIR placement upon d/c remains appropriate.    Follow Up Recommendations  CIR     Equipment Recommendations  Rolling walker with 5" wheels;3in1 (PT)    Recommendations for Other Services Rehab consult     Precautions / Restrictions Precautions Precautions: Fall Precaution Comments: R sided weakness Restrictions Weight Bearing Restrictions: No    Mobility  Bed Mobility               General bed mobility comments: Pt was OOB in the recliner chair.    Transfers Overall transfer level: Needs assistance Equipment used: None Transfers: Sit to/from Stand Sit to Stand: Min guard         General transfer comment: min guard for safety  Ambulation/Gait Ambulation/Gait assistance: Min assist;Min guard Gait Distance (Feet): 160 Feet Assistive device: 1 person hand held assist Gait Pattern/deviations: Step-through pattern;Decreased stride length;Trunk flexed     General Gait Details: Pt able to ambulate without RW. Required Min assist at times to correct RLE buckling and min cues for  upright posture. Pt unable to find room when told to return back for exercises.   Stairs             Wheelchair Mobility    Modified Rankin (Stroke Patients Only)       Balance Overall balance assessment: Needs assistance Sitting-balance support: Feet supported;No upper extremity supported Sitting balance-Leahy Scale: Good     Standing balance support: No upper extremity supported;Single extremity supported Standing balance-Leahy Scale: Fair Standing balance comment: At times pt RLE buckles requiring min A to correct. Attempted standing exercises without support, pt able to maintiain balance 25% of the time. Remaining 75% required single UE support on counter to complete exercises.                            Cognition Arousal/Alertness: Awake/alert Behavior During Therapy: WFL for tasks assessed/performed Overall Cognitive Status: Impaired/Different from baseline                         Following Commands: Follows one step commands consistently;Follows multi-step commands inconsistently;Follows multi-step commands with increased time              Exercises General Exercises - Lower Extremity Long Arc Quad: AROM;Both;10 reps;Seated Hip ABduction/ADduction: AROM;Both;10 reps;Standing Hip Flexion/Marching: AROM;Both;10 reps;Standing Mini-Sqauts: AROM;Both;10 reps;Standing    General Comments        Pertinent Vitals/Pain Pain Assessment: No/denies pain    Home Living                      Prior Function  PT Goals (current goals can now be found in the care plan section) Acute Rehab PT Goals Potential to Achieve Goals: Good Progress towards PT goals: Progressing toward goals    Frequency    Min 4X/week      PT Plan Current plan remains appropriate    Co-evaluation              AM-PAC PT "6 Clicks" Mobility   Outcome Measure  Help needed turning from your back to your side while in a flat bed  without using bedrails?: None Help needed moving from lying on your back to sitting on the side of a flat bed without using bedrails?: None Help needed moving to and from a bed to a chair (including a wheelchair)?: A Little Help needed standing up from a chair using your arms (e.g., wheelchair or bedside chair)?: A Little Help needed to walk in hospital room?: A Little Help needed climbing 3-5 steps with a railing? : A Little 6 Click Score: 20    End of Session Equipment Utilized During Treatment: Gait belt Activity Tolerance: Patient tolerated treatment well Patient left: in chair;with call bell/phone within reach;with chair alarm set Nurse Communication: Mobility status PT Visit Diagnosis: Unsteadiness on feet (R26.81);Other abnormalities of gait and mobility (R26.89);Muscle weakness (generalized) (M62.81);Difficulty in walking, not elsewhere classified (R26.2);Other symptoms and signs involving the nervous system (R29.898);Hemiplegia and hemiparesis Hemiplegia - Right/Left: Right Hemiplegia - dominant/non-dominant: Non-dominant     Time: 8315-1761 PT Time Calculation (min) (ACUTE ONLY): 16 min  Charges:  $Gait Training: 8-22 mins                      Sandria Manly, SPTA    Sandria Manly 09/04/2020, 5:18 PM

## 2020-09-04 NOTE — Telephone Encounter (Signed)
[name] [name] cancelled appts tomorrow as pt still in hospital and going to rehab after inpt.   I told Vickii Chafe to keep May 12 appt and call later if we need to cancel the appt.

## 2020-09-04 NOTE — Plan of Care (Signed)
  Problem: Activity: Goal: Risk for activity intolerance will decrease Outcome: Progressing   Problem: Pain Managment: Goal: General experience of comfort will improve Outcome: Progressing   

## 2020-09-04 NOTE — Progress Notes (Signed)
Inpatient Rehabilitation Admissions Coordinator  I met at bedside with patient and then with his permission, contacted his caregiver, Peggy Bass, by phone. She can provide intermittent assistance when patient discharged home for med managment, groceries, and MD appointments. Patient could benefit from a CIR admit, but no bed available to admit him to today. Patient and Peggy Bass are aware. I will follow his progress.  Barbara Boyette, RN, MSN Rehab Admissions Coordinator [phone] 09/04/2020 10:40 AM  

## 2020-09-04 NOTE — Plan of Care (Signed)

## 2020-09-04 NOTE — PMR Pre-admission (Shared)
PMR Admission Coordinator Pre-Admission Assessment  Patient: [name] [name] is an 65 y.o., male MRN: [id number] DOB: 1955/06/07 Height: 5\' 1"  (154.9 cm) Weight: 56.3 kg              Insurance Information  PRIMARY: uninsured      Referred on 4/13 to [employer] to follow up on previous disability and Medicaid applications with Cecille Rubin at [facility]:       Phone#:   The Therapist, art Information Summary" for patients in Inpatient Rehabilitation Facilities with attached "Privacy Act [place] Records" was provided and verbally reviewed with: N/A  Emergency Contact Information Contact Information    Name Relation Home Work Mobile   Bass,Peggy Friend   [phone]   Marinda Elk [place], [place]  [phone]     Current Medical History  Patient Admitting Diagnosis: Left parietal Lobe metastasis  History of Present Illness:  65 y.o. right-handed male with history of COPD/tobacco use, hypertension, iron deficiency anemia, non-small cell lung cancer, adenocarcinoma diagnosed August 2021 status post chemoradiation now on immunotherapy followed by Dr. Earlie Server.    He does have multiple church members who check on him.  Patient has a sister in [place] and a brother in [place].  Presented 08/22/2020 with right arm and leg numbness and weakness.  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  Underwent stereotactic radiosurgery 08/30/2020 per Dr. Kathyrn Sheriff.  He continues to be followed by oncology/Dr. Earlie Server services and awaiting plan of care.  Patient maintained on Decadron protocol.  Keppra ongoing for seizure prophylaxis.  Tolerating a regular diet.  Patient with some speech difficulties, word finding  Past Medical History  Past Medical History:  Diagnosis Date  . Bullous  emphysema ([place]) 12/25/2019  . COPD (chronic obstructive pulmonary disease) ([place]) 11/02/2007   Qualifier: Diagnosis of  By: Melvyn Novas MD, Christena Deem   . Hypertension 12/24/2019  . Iron deficiency anemia 08/23/2007   Qualifier: Diagnosis of  By: Jim Like     Family History  family history includes Hypertension in an other family member.  Prior Rehab/Hospitalizations:  Has the patient had prior rehab or hospitalizations prior to admission? Yes  Has the patient had major surgery during 100 days prior to admission? Yes  Current Medications   Current Facility-Administered Medications:  .  acetaminophen (TYLENOL) tablet 650 mg, 650 mg, Oral, Q4H PRN, 650 mg at 09/04/20 0730 **OR** acetaminophen (TYLENOL) suppository 650 mg, 650 mg, Rectal, Q4H PRN, Costella, Vincent J, PA-C .  albuterol (VENTOLIN HFA) 108 (90 Base) MCG/ACT inhaler 2 puff, 2 puff, Inhalation, Q4H PRN, Opyd, Timothy S, MD .  bisacodyl (DULCOLAX) EC tablet 5 mg, 5 mg, Oral, Daily PRN, Costella, Vincent J, PA-C, 5 mg at 09/01/20 0906 .  Chlorhexidine Gluconate Cloth 2 % PADS 6 each, 6 each, Topical, Daily, Consuella Lose, MD, 6 each at 09/04/20 0936 .  dexamethasone (DECADRON) tablet 4 mg, 4 mg, Oral, Q12H, Ashok Pall, MD, 4 mg at 09/04/20 0832 .  HYDROcodone-acetaminophen (NORCO/VICODIN) 5-325 MG per tablet 1 tablet, 1 tablet, Oral, Q4H PRN, Costella, Vista Mink, PA-C, 1 tablet at 09/01/20 2221 .  HYDROmorphone (DILAUDID) injection 0.5-1 mg, 0.5-1 mg, Intravenous, Q2H PRN, Costella, Vincent J, PA-C, 0.5 mg at 08/31/20 1038 .  labetalol (NORMODYNE) injection 10-40 mg, 10-40 mg, Intravenous, Q10 min PRN, Costella, Vista Mink, PA-C .  levETIRAcetam (  KEPPRA) tablet 500 mg, 500 mg, Oral, BID, Florencia Reasons, MD, 500 mg at 09/04/20 7510 .  LORazepam (ATIVAN) injection 1 mg, 1 mg, Intravenous, Once PRN, Opyd, Ilene Qua, MD .  mometasone-formoterol (DULERA) 100-5 MCG/ACT inhaler 2 puff, 2 puff, Inhalation, BID, Opyd, Ilene Qua, MD, 2 puff at  09/04/20 0833 .  naloxone (NARCAN) injection 0.08 mg, 0.08 mg, Intravenous, PRN, Costella, Vincent J, PA-C .  ondansetron (ZOFRAN) tablet 4 mg, 4 mg, Oral, Q4H PRN **OR** ondansetron (ZOFRAN) injection 4 mg, 4 mg, Intravenous, Q4H PRN, Costella, Vincent J, PA-C .  pantoprazole (PROTONIX) EC tablet 40 mg, 40 mg, Oral, Q supper, Alvira Philips, [place], 40 mg at 09/03/20 1651 .  polyethylene glycol (MIRALAX / GLYCOLAX) packet 17 g, 17 g, Oral, Daily PRN, Costella, Vincent J, PA-C .  promethazine (PHENERGAN) tablet 12.5-25 mg, 12.5-25 mg, Oral, Q4H PRN, Costella, Vista Mink, PA-C .  senna (SENOKOT) tablet 8.6 mg, 1 tablet, Oral, BID, Costella, Vincent J, PA-C, 8.6 mg at 09/04/20 2585 .  sodium phosphate (FLEET) 7-19 GM/118ML enema 1 enema, 1 enema, Rectal, Once PRN, Costella, Vincent J, PA-C .  umeclidinium bromide (INCRUSE ELLIPTA) 62.5 MCG/INH 1 puff, 1 puff, Inhalation, Daily, Opyd, Ilene Qua, MD, 1 puff at 09/04/20 2778  Patients Current Diet:  Diet Order            Diet regular Room service appropriate? Yes; Fluid consistency: Thin  Diet effective now                 Precautions / Restrictions Precautions Precautions: Fall Precaution Comments: R sided weakness Restrictions Weight Bearing Restrictions: No   Has the patient had 2 or more falls or a fall with injury in the past year?No  Prior Activity Level Limited Community (1-2x/wk): Pt. went out mostly for appointments and some errands  Prior Functional Level Prior Function Level of Independence: Independent Comments: Pt takes public transportation or walks to stores, he does not drive. Pt independent with all ADLs and mobility without AD/AE. No falls in past 6 months prior to onset of symptoms.  Self Care: Did the patient need help bathing, dressing, using the toilet or eating?  Independent  Indoor Mobility: Did the patient need assistance with walking from room to room (with or without device)? Independent  Stairs: Did  the patient need assistance with internal or external stairs (with or without device)? Independent  Functional Cognition: Did the patient need help planning regular tasks such as shopping or remembering to take medications? Independent  Home Assistive Devices / Equipment Home Equipment: Grab bars - tub/shower  Prior Device Use: Indicate devices/aids used by the patient prior to current illness, exacerbation or injury? None of the above  Current Functional Level Cognition  Overall Cognitive Status: Impaired/Different from baseline Current Attention Level: Selective Orientation Level: Oriented X4 Following Commands: Follows one step commands consistently,Follows multi-step commands inconsistently,Follows multi-step commands with increased time Safety/Judgement: Decreased awareness of safety,Decreased awareness of deficits General Comments: Administered Pill Box Test to further assess cognition and executive functioning. Pt placing all the pills into morning and noon sections. Reporting "I know I got some of this wrong, but I dont know which ones." Pt very eager to review assessment and learn from mistakes.    Extremity Assessment (includes Sensation/Coordination)  Upper Extremity Assessment: RUE deficits/detail RUE Deficits / Details: Decreased coorindation both fine and gross. 4/5 strength. RUE Sensation: decreased light touch,decreased proprioception RUE Coordination: decreased fine motor,decreased gross motor  Lower Extremity Assessment: Defer to PT  evaluation RLE Deficits / Details: MMT scores of 4 to 4+ grossly compared to 4+ to 5 on L RLE Sensation:  (throughout) RLE Coordination: decreased fine motor,decreased gross motor (slow initiation of foot tapping when trying to tap bil feet simultaneously)    ADLs  Overall ADL's : Needs assistance/impaired Eating/Feeding: Set up Eating/Feeding Details (indicate cue type and reason): difficulty cutting food; drops utensils; difficuulty  maintaining grasp on cups Grooming: Oral care,Min guard,Supervision/safety,Standing Grooming Details (indicate cue type and reason): Decreased in hand manipulation. Difficulty opening containers and requiring increased time. Upper Body Bathing: Set up,Supervision/ safety,Sitting Lower Body Bathing: Minimal assistance,Sit to/from stand Upper Body Dressing : Set up,Supervision/safety,Sitting Lower Body Dressing: Minimal assistance,Sit to/from stand Lower Body Dressing Details (indicate cue type and reason): Pt just finishing PT. Reports they discussed sitting to don underwear and pants. Agreed and recommended pt sit for LB dressing. Reviewed education on this topic with patient. Toilet Transfer: Min guard,Ambulation,RW (simulated to recliner) Armed forces technical officer Details (indicate cue type and reason): Min guard A for safety Toileting- Clothing Manipulation and Hygiene: Min guard,Sit to/from stand Functional mobility during ADLs: Rolling walker,Min guard General ADL Comments: Administered the Pill Box Test. See cognition section.    Mobility  Overal bed mobility: Modified Independent General bed mobility comments: Pt was OOB in the recliner chair.    Transfers  Overall transfer level: Needs assistance Equipment used: Rolling walker (2 wheeled),None Transfers: Sit to/from Stand Sit to Stand: Min guard General transfer comment: min guard assist for safety during transitions, cues for safe hand placement.    Ambulation / Gait / Stairs / Wheelchair Mobility  Ambulation/Gait Ambulation/Gait assistance: Min assist,Min guard Gait Distance (Feet): 85 Feet (x2 with 5 min seated rest break.) Assistive device: Rolling walker (2 wheeled),None,1 person hand held assist Gait Pattern/deviations: Step-to pattern,Decreased step length - right,Decreased stride length,Decreased dorsiflexion - right,Trunk flexed,Step-through pattern General Gait Details: Ambulated most of the way with RW, decreased brisk  progression of R LE forward, min guard assist for balance with RW min hand held assist to walk the last 20' without RW. Gait velocity: reduced Gait velocity interpretation: <1.31 ft/sec, indicative of household ambulator    Posture / Balance Dynamic Sitting Balance Sitting balance - Comments: able to sit EOB/chair without PT assist Balance Overall balance assessment: Needs assistance Sitting-balance support: Feet supported,No upper extremity supported Sitting balance-Leahy Scale: Good Sitting balance - Comments: able to sit EOB/chair without PT assist Postural control: Right lateral lean Standing balance support: Single extremity supported,No upper extremity supported,Bilateral upper extremity supported Standing balance-Leahy Scale: Fair Standing balance comment: close supervision in static standing, dynamic task of standing to wash privates required min guard assist as pt was leaning bil lower legs hard on the recliner for support and the recliner was slowly moving away from the patient.    Special needs/care consideration {Special Care Needs/Care Considerations:[id number]}     Previous Home Environment  Living Arrangements: Alone  Lives With: Alone Available Help at Discharge:  [name], CHurch administrator states she can assist intermittently with med Customer service manager, groceries and MD appointments) Type of Home: Apartment Home Layout: One level Home Access: Level entry Bathroom Shower/Tub: Chiropodist: Standard Bathroom Accessibility: Yes How Accessible: Accessible via walker [place]: No  Discharge Living Setting Plans for Discharge Living Setting: Patient's home,Alone,Apartment Type of Home at Discharge: Apartment Discharge Home Layout: One level Discharge Home Access: Level entry Discharge Bathroom Shower/Tub: Tub/shower unit Discharge Bathroom Toilet: Standard Discharge Bathroom Accessibility: Yes How Accessible: Accessible  via walker Does  the patient have any problems obtaining your medications?: Yes (Describe) (uninsured)  Social/Family/Support Systems Contact Information: [name], church administrator Anticipated Caregiver: church members intermittently Anticipated Ambulance person Information: see above Ability/Limitations of Caregiver: prn only Caregiver Availability: Intermittent Discharge Plan Discussed with Primary Caregiver: Yes Is Caregiver In Agreement with Plan?: Yes Does Caregiver/Family have Issues with Lodging/Transportation while Pt is in Rehab?: No  Goals Patient/Family Goal for Rehab: Mod I with PT, OT and SLP Expected length of stay: ELOS 7 to 10 days Pt/Family Agrees to Admission and willing to participate: Yes Program Orientation Provided & Reviewed with Pt/Caregiver Including Roles  & Responsibilities: Yes  Decrease burden of Care through IP rehab admission: n/a  Possible need for SNF placement upon discharge:not antcipated  Patient Condition: This patient's medical and functional status has changed since the consult dated: 09/03/2020 in which the Rehabilitation Physician determined and documented that the patient's condition is appropriate for intensive rehabilitative care in an inpatient rehabilitation facility. See "History of Present Illness" (above) for medical update. Functional changes are: ***. Patient's medical and functional status update has been discussed with the Rehabilitation physician and patient remains appropriate for inpatient rehabilitation. Will admit to inpatient rehab today.  Preadmission Screen Completed By:  Cleatrice Burke, RN, 09/04/2020 2:00 PM ______________________________________________________________________   Discussed status with Dr. Marland Kitchenon***at *** and received approval for admission today.  Admission Coordinator:  Cleatrice Burke, time***/Date***

## 2020-09-05 ENCOUNTER — Encounter (HOSPITAL_COMMUNITY): Payer: Self-pay | Admitting: Neurosurgery

## 2020-09-05 NOTE — H&P (Incomplete)
Physical Medicine and Rehabilitation Admission H&P    Chief Complaint  Patient presents with  . Extremity Weakness  : HPI: [name] [name] is a 65 year old right-handed male with history of COPD/tobacco abuse, hypertension, iron deficiency anemia, non-small cell lung cancer adenocarcinoma diagnosed August 2021 status post chemoradiation now on immunotherapy followed by Dr. Earlie Server.  Per chart review patient lives alone reportedly independent prior to admission.  1 level apartment.  He takes public transportation or walks to the store.  He does have multiple church members who check on him.  He has a sister in [place] and a brother in [place].  Presented 08/22/2020 with right arm and leg numbness and weakness as well as word finding difficulties.  He underwent CT and imaging found to have a 3.2 x 2.2 x 3 cm intra-axial posterior left parietal convexity brain mass with significant vasogenic edema.  Underwent stereotactic radiosurgery 08/30/2020 per Dr. Kathyrn Sheriff.  He continue to be followed by oncology service Dr. Earlie Server.  Maintained on Decadron protocol.  Keppra ongoing for seizure prophylaxis.  Tolerating a regular diet.  Therapy evaluations completed due to patient decreased functional mobility and speech difficulties he was admitted for a comprehensive rehab program  Review of Systems  Constitutional: Negative for chills and fever.  HENT: Negative for hearing loss.   Eyes: Negative for blurred vision and double vision.  Respiratory: Positive for cough and shortness of breath.   Cardiovascular: Negative for chest pain and palpitations.  Gastrointestinal: Positive for constipation. Negative for heartburn, nausea and vomiting.  Genitourinary: Negative for dysuria, flank pain and hematuria.  Musculoskeletal: Positive for myalgias.  Skin: Negative for rash.  Neurological: Positive for sensory change, speech change and weakness.  All other systems reviewed and are negative.  Past  Medical History:  Diagnosis Date  . 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[place]) 12/25/2019  . COPD (chronic obstructive pulmonary disease) ([place]) 11/02/2007   Qualifier: Diagnosis of  By: Melvyn Novas MD, Christena Deem   . Hypertension 12/24/2019  . Iron deficiency anemia 08/23/2007   Qualifier: Diagnosis of  By: Jim Like     Past Surgical History:  Procedure Laterality Date  . APPLICATION OF CRANIAL NAVIGATION N/A 08/30/2020   Procedure: APPLICATION OF CRANIAL NAVIGATION;  Surgeon: Consuella Lose, MD;  Location: [facility];  Service: Neurosurgery;  Laterality: N/A;  . CRANIOTOMY Left 08/30/2020   Procedure: Stereotactic left frontoparietal craniectomy for resection of tumor with brainlab;  Surgeon: Consuella Lose, MD;  Location: [facility];  Service: Neurosurgery;  Laterality: Left;  . ENDOBRONCHIAL ULTRASOUND N/A 12/27/2019   Procedure: ENDOBRONCHIAL ULTRASOUND;  Surgeon: Laurin Coder, MD;  Location: [facility];  Service: Endoscopy;  Laterality: N/A;  . FINE NEEDLE ASPIRATION  12/27/2019   Procedure: FINE NEEDLE ASPIRATION;  Surgeon: Laurin Coder, MD;  Location: [facility];  Service: Endoscopy;;  . VIDEO BRONCHOSCOPY N/A 12/27/2019   Procedure: VIDEO BRONCHOSCOPY WITHOUT FLUORO;  Surgeon: Laurin Coder, MD;  Location: [facility];  Service: Endoscopy;  Laterality: N/A;   Family History  Problem Relation Age of Onset  . Hypertension Other    Social History:  reports that he has quit smoking. His smoking use included cigarettes. He smoked 0.50 packs per day. He has never used smokeless tobacco. He reports previous alcohol use. He reports that he does not use drugs. Allergies:  Allergies  Allergen Reactions  . Other Itching and Other (See Comments)    Seasonal allergies- Itchy eyes, runny nose, sneezing, scratchy throat   Medications Prior to Admission  Medication Sig Dispense Refill  . acetaminophen (TYLENOL) 500 MG tablet Take 500 mg by mouth every 6 (six) hours as needed for mild pain or  moderate pain.    Marland Kitchen aspirin 81 MG EC tablet Take 1 tablet (81 mg total) by mouth daily. 30 tablet 11  . budesonide-formoterol (SYMBICORT) 80-4.5 MCG/ACT inhaler Inhale 2 puffs into the lungs 2 (two) times daily. (Patient taking differently: Inhale 2 puffs into the lungs 2 (two) times daily as needed (for flares of wheezing and/or shortness of breath).) 1 Inhaler 11  . feeding supplement (ENSURE ENLIVE / ENSURE PLUS) LIQD Take 237 mLs by mouth 2 (two) times daily between meals. 237 mL 12  . Multiple Vitamin (MULTIVITAMIN) tablet Take 1 tablet by mouth daily.    . prochlorperazine (COMPAZINE) 10 MG tablet Take 1 tablet (10 mg total) by mouth every 6 (six) hours as needed for nausea or vomiting. 30 tablet 0  . senna-docusate (SENOKOT-S) 8.6-50 MG tablet Take 1 tablet by mouth at bedtime. (Patient taking differently: Take 1 tablet by mouth at bedtime as needed for mild constipation.)    . SPIRIVA HANDIHALER 18 MCG inhalation capsule Place 1 capsule into inhaler and inhale daily.    . sucralfate (CARAFATE) 1 g tablet Take 1 tablet (1 g total) by mouth 4 (four) times daily. Dissolve each tablet in 15 cc water before use. (Patient taking differently: Take 1 g by mouth See admin instructions. Dissolve 1 gram (1 tablet) in 15 ml's (1 TABLESPOONFUL) of water before taking- FOUR times a day) 120 tablet 2  . VENTOLIN HFA 108 (90 Base) MCG/ACT inhaler Inhale 2 puffs into the lungs every 4 (four) hours as needed for wheezing or shortness of breath.      Drug Regimen Review Drug regimen was reviewed and remains appropriate with no significant issues identified  Home: Home Living Family/patient expects to be discharged to:: Private residence Living Arrangements: Alone Available Help at Discharge:  [name], [name] states she can assist intermittently with med Customer service manager, groceries and MD appointments) Type of Home: Apartment Home Access: Level entry Home Layout: One level Bathroom Shower/Tub:  Chiropodist: Standard Bathroom Accessibility: Yes Home Equipment: Grab bars - tub/shower  Lives With: Alone   Functional History: Prior Function Level of Independence: Independent Comments: Pt takes public transportation or walks to stores, he does not drive. Pt independent with all ADLs and mobility without AD/AE. No falls in past 6 months prior to onset of symptoms.  Functional Status:  Mobility: Bed Mobility Overal bed mobility: Modified Independent General bed mobility comments: Pt was OOB in the recliner chair. Transfers Overall transfer level: Needs assistance Equipment used: None Transfers: Sit to/from Stand Sit to Stand: Min guard General transfer comment: min guard for safety Ambulation/Gait Ambulation/Gait assistance: Min assist,Min guard Gait Distance (Feet): 160 Feet Assistive device: 1 person hand held assist Gait Pattern/deviations: Step-through pattern,Decreased stride length,Trunk flexed General Gait Details: Pt able to ambulate without RW. Required Min assist at times to correct RLE buckling and min cues for upright posture. Pt unable to find room when told to return back for exercises. Gait velocity: reduced Gait velocity interpretation: <1.31 ft/sec, indicative of household ambulator    ADL: ADL Overall ADL's : Needs assistance/impaired Eating/Feeding: Set up Eating/Feeding Details (indicate cue type and reason): difficulty cutting food; drops utensils; difficuulty maintaining grasp on cups Grooming: Oral care,Min guard,Supervision/safety,Standing Grooming Details (indicate cue type and reason): Decreased in hand manipulation. Difficulty opening containers and requiring increased time.  Upper Body Bathing: Set up,Supervision/ safety,Sitting Lower Body Bathing: Minimal assistance,Sit to/from stand Upper Body Dressing : Set up,Supervision/safety,Sitting Lower Body Dressing: Minimal assistance,Sit to/from stand Lower Body Dressing Details  (indicate cue type and reason): Pt just finishing PT. Reports they discussed sitting to don underwear and pants. Agreed and recommended pt sit for LB dressing. Reviewed education on this topic with patient. Toilet Transfer: Min guard,Ambulation,RW (simulated to recliner) Armed forces technical officer Details (indicate cue type and reason): Min guard A for safety Toileting- Clothing Manipulation and Hygiene: Min guard,Sit to/from stand Functional mobility during ADLs: Rolling walker,Min guard General ADL Comments: Administered the Pill Box Test. See cognition section.  Cognition: Cognition Overall Cognitive Status: Impaired/Different from baseline Orientation Level: Oriented X4 Cognition Arousal/Alertness: Awake/alert Behavior During Therapy: WFL for tasks assessed/performed Overall Cognitive Status: Impaired/Different from baseline Area of Impairment: Memory,Following commands,Safety/judgement,Awareness,Problem solving Current Attention Level: Selective Memory: Decreased recall of precautions,Decreased short-term memory Following Commands: Follows one step commands consistently,Follows multi-step commands inconsistently,Follows multi-step commands with increased time Safety/Judgement: Decreased awareness of safety,Decreased awareness of deficits Awareness: Intellectual,Emergent Problem Solving: Slow processing,Difficulty sequencing,Requires verbal cues General Comments: Administered Pill Box Test to further assess cognition and executive functioning. Pt placing all the pills into morning and noon sections. Reporting "I know I got some of this wrong, but I dont know which ones." Pt very eager to review assessment and learn from mistakes.  Physical Exam: Blood pressure 112/67, pulse 92, temperature 98 F (36.7 C), temperature source Oral, resp. rate 17, height 5\' 1"  (1.549 m), weight 56.3 kg, SpO2 98 %. Physical Exam Neurological:     Comments: Patient is alert in no acute distress.  Makes eye contact  with examiner follows commands.  Provides his name and age.  He did have some word finding difficulties.     No results found for this or any previous visit (from the past 48 hour(s)). No results found.     Medical Problem List and Plan: 1.  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 Earlie Server.  Decadron protocol  -patient may *** shower  -ELOS/Goals: *** 2.  Antithrombotics: -DVT/anticoagulation: SCDs  -antiplatelet therapy: N/A 3. Pain Management: Hydrocodone as needed 4. Mood: Provide emotional support  -antipsychotic agents: N/A 5. Neuropsych: This patient is capable of making decisions on his own behalf. 6. Skin/Wound Care: Routine skin checks 7. Fluids/Electrolytes/Nutrition: Routine in and outs with follow-up chemistries 8.  Seizure prophylaxis.  Keppra 500 mg twice daily 9.  History of COPD/tobacco abuse.  Continue inhalers.  Provide education regards to cessation of nicotine products 10.  Iron deficiency anemia.  Follow-up CBC  ***  Cathlyn Parsons, PA-C 09/05/2020

## 2020-09-05 NOTE — Progress Notes (Addendum)
Inpatient Rehabilitation Admissions Coordinator  CIR bed is not available to admit him to today. I have alerted acute team and TOC that other options may need to be pursued due to limited bed availability. Hopeful that patient can continue to progress with adls and mobility to discharge home with intermittent assistance of church friends.  Danne Baxter, RN, MSN Rehab Admissions Coordinator [phone] 09/05/2020 10:09 AM

## 2020-09-05 NOTE — Progress Notes (Signed)
PROGRESS NOTE Brief same day note  [name] [name]  YCX:[id number] DOB: 11/18/1955 DOA: 08/22/2020 PCP: [name], NP   Chief Complain: Right arm/right leg numbness/weakness  Brief Narrative: Patient is a 65 year old male with history of COPD, non-small cell lung cancer status post chemoradiation, now on immunotherapy, active smoker who presented to the emergency department with further evaluation of right-sided weakness.  On presentation, he was hemodynamically stable.  Noncontrast head CT showed new 3.1 cm mass lesion involving the left posterior parietal convexity with large area of surrounding vasogenic edema and associated mass-effect.  CT with contrast confirmed 3.2 x 2.2 x 3 cm intra-axial mass lesion consistent with metastasis.  Started on Keppra, Decadron. Oncology,neurooncology and neurosurgery consulted,underwent stereotactic left frontoparietal cranitomy for resection of tumor on  4/14  PT recommending inpatient rehab on discharge.  Waiting for bed  Assessment & Plan:   Principal Problem:   Brain metastasis ([place]) Active Problems:   COPD (chronic obstructive pulmonary disease) ([facility])   Malignant neoplasm of bronchus of right upper lobe ([facility])   Brain mass   Brain mass: Presented with right-sided numbness/weakness.  CT with contrast confirmed 3.2 x 2.2 x 3 cm intra-axial mass lesion consistent with metastasis.   Oncology,neurooncology and neurosurgery consulted. underwent stereotactic left frontoparietal cranitomy for resection of tumor on  4/14 Continue Decadron taper , also on Keppra 500 mg twice daily, will continue Keppra for 1 to 2 weeks postsurgery. Currently alert and oriented but was confused on presentation. He has mild weakness on the right side  Non-small cell lung cancer: Follows with Dr. Julien Nordmann, status post chemoradiation, now on chemotherapy with Imfinzi.  COPD: Currently stable, not in exacerbation.  Continue bronchodilators.  On room air.  Right-sided  weakness: PT recommended CIR on discharge.         DVT prophylaxis:Lovenox Code Status: Full Family Communication: None at bedside Status is: Inpatient   Dispo: The patient is from: Home              Anticipated d/c is to: CIR              Patient currently is medically stable for discharge   Difficult to place patient No     Consultants: Neurology  Procedures:None  Antimicrobials:  Anti-infectives (From admission, onward)   Start     Dose/Rate Route Frequency Ordered Stop   08/30/20 1800  ceFAZolin (ANCEF) IVPB 2g/100 mL premix        2 g 200 mL/hr over 30 Minutes Intravenous Every 8 hours 08/30/20 1643 08/31/20 0312   08/30/20 1315  ceFAZolin (ANCEF) IVPB 2g/100 mL premix  Status:  Discontinued        2 g 200 mL/hr over 30 Minutes Intravenous To Surgery 08/30/20 1249 08/30/20 1746      Subjective: Patient seen and examined the bedside this morning.  Hemodynamically stable.  Denies any new complaints today.  Objective: Vitals:   09/04/20 2016 09/05/20 0600 09/05/20 0731 09/05/20 0822  BP: 112/67 114/67 118/80   Pulse: 92 76 81   Resp:  16 16   Temp: 98 F (36.7 C) 98.2 F (36.8 C) 98 F (36.7 C)   TempSrc: Oral Oral    SpO2: 98% 100% 98% 98%  Weight:      Height:        Intake/Output Summary (Last 24 hours) at 09/05/2020 1131 Last data filed at 09/05/2020 1100 Gross per 24 hour  Intake --  Output 1400 ml  Net -1400 ml  Filed Weights   08/23/20 0552  Weight: 56.3 kg    Examination:  General exam: Overall comfortable, not in distress HEENT: PERRL, suture on the scalp Respiratory system:  no wheezes or crackles  Cardiovascular system: S1 & S2 heard, RRR.  Gastrointestinal system: Abdomen is nondistended, soft and nontender. Central nervous system: Alert and oriented,right sided mild weakness Extremities: No edema, no clubbing ,no cyanosis Skin: No rashes, no ulcers,no icterus   Data Reviewed: I have personally reviewed following labs and  imaging studies  CBC: Recent Labs  Lab 08/31/20 0310 09/01/20 0153  WBC 12.0* 10.7*  NEUTROABS  --  8.7*  HGB 11.9* 11.1*  HCT 37.0* 33.7*  MCV 81.0 79.5*  PLT 236 622   Basic Metabolic Panel: Recent Labs  Lab 08/31/20 0310 09/01/20 0153  NA 132* 134*  K 4.1 4.0  CL 104 103  CO2 22 24  GLUCOSE 109* 158*  BUN 14 14  CREATININE 0.90 0.69  CALCIUM 8.4* 8.6*  MG  --  2.1   GFR: Estimated Creatinine Clearance: 69.9 mL/min (by C-G formula based on SCr of 0.69 mg/dL). Liver Function Tests: No results for input(s): AST, ALT, ALKPHOS, BILITOT, PROT, ALBUMIN in the last 168 hours. No results for input(s): LIPASE, AMYLASE in the last 168 hours. No results for input(s): AMMONIA in the last 168 hours. Coagulation Profile: No results for input(s): INR, PROTIME in the last 168 hours. Cardiac Enzymes: No results for input(s): CKTOTAL, CKMB, CKMBINDEX, TROPONINI in the last 168 hours. BNP (last 3 results) No results for input(s): PROBNP in the last 8760 hours. HbA1C: No results for input(s): HGBA1C in the last 72 hours. CBG: No results for input(s): GLUCAP in the last 168 hours. Lipid Profile: No results for input(s): CHOL, HDL, LDLCALC, TRIG, CHOLHDL, LDLDIRECT in the last 72 hours. Thyroid Function Tests: No results for input(s): TSH, T4TOTAL, FREET4, T3FREE, THYROIDAB in the last 72 hours. Anemia Panel: No results for input(s): VITAMINB12, FOLATE, FERRITIN, TIBC, IRON, RETICCTPCT in the last 72 hours. Sepsis Labs: No results for input(s): PROCALCITON, LATICACIDVEN in the last 168 hours.  No results found for this or any previous visit (from the past 240 hour(s)).       Radiology Studies: No results found.      Scheduled Meds: . Chlorhexidine Gluconate Cloth  6 each Topical Daily  . dexamethasone  4 mg Oral Q12H  . levETIRAcetam  500 mg Oral BID  . mometasone-formoterol  2 puff Inhalation BID  . pantoprazole  40 mg Oral Q supper  . senna  1 tablet Oral BID   . umeclidinium bromide  1 puff Inhalation Daily   Continuous Infusions:    LOS: 12 days    Time spent: 25 mins.More than 50% of that time was spent in counseling and/or coordination of care.      [name], MD [facility] P4/20/2022, 11:31 AM

## 2020-09-05 NOTE — Progress Notes (Signed)
Occupational Therapy Treatment Patient Details Name: [name] [name] MRN: [id number] DOB: November 29, 1955 Today's Date: 09/05/2020    History of present illness 65 y.o. male who presented 08/22/20 with R-sided weakness and numbness. Noncontrast head CT concerning for new 3.1 cm mass lesion involving the left posterior parietal convexity with large area of surrounding vasogenic edema and associated mass-effect. This is consistent with metastatic disease. S/p stereotactic L frontoparietal craniotomy for resection of tumor 4/14. PMH: HTN, anemia, bullous emphysema, COPD, and non-small cell lung cancer status post chemoradiation and now on immunotherapy.   OT comments  Patient with good progress toward patient focused OT goals.  Patient is telling this OT what other therapists are suggesting he do to remain safe.  The patient mentions getting washed up and dressed from a seated position to reduce his fall risk.  He also mentions having folks help him with "things" around the house.  Today, he was able to mobilize around the unit with [facility] and had only one R leg buckle.  He was able to locate his room without cues, and once entered, was able to perform stand grooming with supervision, and lower body ADL with setup and Min Guard when standing.  CIR has been recommended, but as of yet, they do not have a bed for him.  A short rehab stay to continue to work on his cognition during functional tasks is recommended.  Medication management appears to be a concern for safety if he is sent home alone.  OT will continue to follow in the acute setting.    Follow Up Recommendations  SNF vs CIR   Equipment Recommendations  3 in 1 bedside commode;Tub/shower seat    Recommendations for Other Services      Precautions / Restrictions Precautions Precautions: Fall Precaution Comments: R sided weakness Restrictions Weight Bearing Restrictions: No       Mobility Bed Mobility               General bed  mobility comments: Pt was OOB in the recliner chair.    Transfers Overall transfer level: Needs assistance Equipment used: None Transfers: Sit to/from Stand Sit to Stand: Supervision              Balance Overall balance assessment: Needs assistance Sitting-balance support: Feet supported;No upper extremity supported Sitting balance-Leahy Scale: Good     Standing balance support: No upper extremity supported Standing balance-Leahy Scale: Fair                             ADL either performed or assessed with clinical judgement   ADL Overall ADL's : Needs assistance/impaired     Grooming: Oral care;Min guard;Supervision/safety;Standing           Upper Body Dressing : Set up;Sitting Upper Body Dressing Details (indicate cue type and reason): able to donn hospital gown seated, standing to adjust with supervision Lower Body Dressing: Set up;Sitting/lateral leans Lower Body Dressing Details (indicate cue type and reason): patient able to switch socks, and discussed sitting to place underware. Toilet Transfer: Magazine features editor Details (indicate cue type and reason): R leg buckle noted                                   Cognition  General Comments: no changes from previous session        Exercises     Shoulder Instructions       General Comments      Pertinent Vitals/ Pain       Pain Assessment: No/denies pain                                                          Frequency  Min 2X/week        Progress Toward Goals  OT Goals(current goals can now be found in the care plan section)  Progress towards OT goals: Progressing toward goals  Acute Rehab OT Goals Patient Stated Goal: to improve and return home OT Goal Formulation: With patient Time For Goal Achievement: 09/08/20 Potential to Achieve Goals: Good  Plan Discharge plan remains  appropriate    Co-evaluation                 AM-PAC OT "6 Clicks" Daily Activity     Outcome Measure   Help from another person eating meals?: None Help from another person taking care of personal grooming?: None Help from another person toileting, which includes using toliet, bedpan, or urinal?: A Little Help from another person bathing (including washing, rinsing, drying)?: A Little Help from another person to put on and taking off regular upper body clothing?: None Help from another person to put on and taking off regular lower body clothing?: A Little 6 Click Score: 21    End of Session Equipment Utilized During Treatment: Gait belt  OT Visit Diagnosis: Unsteadiness on feet (R26.81);Other abnormalities of gait and mobility (R26.89);Muscle weakness (generalized) (M62.81);Apraxia (R48.2);Other symptoms and signs involving cognitive function;Hemiplegia and hemiparesis Hemiplegia - Right/Left: Right Hemiplegia - dominant/non-dominant: Non-Dominant Hemiplegia - caused by: Unspecified   Activity Tolerance Patient tolerated treatment well   Patient Left in chair;with call bell/phone within reach;with chair alarm set   Nurse Communication          Time: 1511-1530 OT Time Calculation (min): 19 min  Charges: OT General Charges $OT Visit: 1 Visit OT Treatments $Self Care/Home Management : 8-22 mins  09/05/2020  [name] [name], [name] [name]  Acute Rehabilitation Services  Office:  [phone]    Metta Clines 09/05/2020, 3:42 PM

## 2020-09-05 NOTE — Plan of Care (Signed)
  Problem: Pain Managment: Goal: General experience of comfort will improve Outcome: Progressing   Problem: Safety: Goal: Ability to remain free from injury will improve Outcome: Progressing   

## 2020-09-05 NOTE — Progress Notes (Signed)
Physical Therapy Treatment Patient Details Name: [name] [name] MRN: [id number] DOB: Jul 23, 1955 Today's Date: 09/05/2020    History of Present Illness 65 y.o. male who presented 08/22/20 with R-sided weakness and numbness. Noncontrast head CT concerning for new 3.1 cm mass lesion involving the left posterior parietal convexity with large area of surrounding vasogenic edema and associated mass-effect. This is consistent with metastatic disease. S/p stereotactic L frontoparietal craniotomy for resection of tumor 4/14. PMH: HTN, anemia, bullous emphysema, COPD, and non-small cell lung cancer status post chemoradiation and now on immunotherapy.    PT Comments    Pt was sitting in recliner chair at arrival. Challenged pt's cognitive status by performing DGI as well as asking multiple orientation questions. Pt was able to answer the questions and perform that tasks correctly however required increased time. Pt was also unable to navigate back to his room after gait training.  Due to cognitive impairments raising worries about safety, continue to recommend CIR as he presents with more OT/SLP needs.   Follow Up Recommendations  CIR     Equipment Recommendations  Rolling walker with 5" wheels;3in1 (PT)    Recommendations for Other Services Rehab consult     Precautions / Restrictions Precautions Precautions: Fall Precaution Comments: R sided weakness Restrictions Weight Bearing Restrictions: No    Mobility  Bed Mobility               General bed mobility comments: Pt was in recliner chair.    Transfers Overall transfer level: Needs assistance Equipment used: None Transfers: Sit to/from Stand Sit to Stand: Supervision         General transfer comment: supervision for safety  Ambulation/Gait Ambulation/Gait assistance: Min guard Gait Distance (Feet): 200 Feet Assistive device: None Gait Pattern/deviations: Step-through pattern;Decreased stride length;Trunk flexed      General Gait Details: Min guard for balance and safety.   Stairs             Wheelchair Mobility    Modified Rankin (Stroke Patients Only)       Balance Overall balance assessment: Needs assistance Sitting-balance support: Feet supported;No upper extremity supported Sitting balance-Leahy Scale: Good     Standing balance support: No upper extremity supported Standing balance-Leahy Scale: Fair                   Standardized Balance Assessment Standardized Balance Assessment : Dynamic Gait Index   Dynamic Gait Index Level Surface: Normal (decreased gait speed.) Change in Gait Speed: Moderate Impairment Gait with Horizontal Head Turns: Severe Impairment (Pt does not fully turn head and slows gait speed.) Gait with Vertical Head Turns: Mild Impairment Gait and Pivot Turn: Mild Impairment Step Over Obstacle: Moderate Impairment Step Around Obstacles: Moderate Impairment Steps: Moderate Impairment Total Score: 11      Cognition Arousal/Alertness: Awake/alert Behavior During Therapy: WFL for tasks assessed/performed Overall Cognitive Status: Impaired/Different from baseline Area of Impairment: Following commands                     Memory: Decreased recall of precautions Following Commands: Follows one step commands consistently;Follows multi-step commands inconsistently;Follows multi-step commands with increased time;Follows one step commands with increased time       General Comments: no changes from previous session      Exercises      General Comments General comments (skin integrity, edema, etc.): Pt was tested for balance/cognitive impairments using DGI as well as asking multiple orientation questions while performing gait. Although able to answer  the questions he required increased time to complete the task. When asked to perform tasks on DGI he requried multiple repeated direction and/or increased time to follow direction. When asked to  take [place] back to the room, pt unable to find room and states  "this is a big place. Anybody would get lost in here".      Pertinent Vitals/Pain Pain Assessment: No/denies pain Pain Score: 0-No pain    Home Living                      Prior Function            PT Goals (current goals can now be found in the care plan section) Acute Rehab PT Goals Patient Stated Goal: to improve and return home Potential to Achieve Goals: Good Progress towards PT goals: Progressing toward goals    Frequency    Min 4X/week      PT Plan Current plan remains appropriate    Co-evaluation              AM-PAC PT "6 Clicks" Mobility   Outcome Measure  Help needed turning from your back to your side while in a flat bed without using bedrails?: None Help needed moving from lying on your back to sitting on the side of a flat bed without using bedrails?: None Help needed moving to and from a bed to a chair (including a wheelchair)?: A Little Help needed standing up from a chair using your arms (e.g., wheelchair or bedside chair)?: A Little Help needed to walk in hospital room?: A Little Help needed climbing 3-5 steps with a railing? : A Little 6 Click Score: 20    End of Session Equipment Utilized During Treatment: Gait belt Activity Tolerance: Patient tolerated treatment well Patient left: in chair;with call bell/phone within reach;with chair alarm set Nurse Communication: Mobility status PT Visit Diagnosis: Unsteadiness on feet (R26.81);Other abnormalities of gait and mobility (R26.89);Muscle weakness (generalized) (M62.81);Difficulty in walking, not elsewhere classified (R26.2);Other symptoms and signs involving the nervous system (R29.898);Hemiplegia and hemiparesis Hemiplegia - Right/Left: Right     Time: 1117-3567 PT Time Calculation (min) (ACUTE ONLY): 19 min  Charges:  $Gait Training: 8-22 mins                      Sandria Manly, SPTA    Sandria Manly 09/05/2020, 5:17  PM

## 2020-09-06 ENCOUNTER — Inpatient Hospital Stay: Payer: Medicaid Other

## 2020-09-06 ENCOUNTER — Inpatient Hospital Stay: Payer: Medicaid Other | Admitting: Physician Assistant

## 2020-09-06 NOTE — Plan of Care (Signed)

## 2020-09-06 NOTE — Progress Notes (Signed)
PROGRESS NOTE Brief same day note  [name] [name]  OHY:[id number] DOB: Apr 22, 1956 DOA: 08/22/2020 PCP: [name], NP   Chief Complain: Right arm/right leg numbness/weakness  Brief Narrative: Patient is a 65 year old male with history of COPD, non-small cell lung cancer status post chemoradiation, now on immunotherapy, active smoker who presented to the emergency department with further evaluation of right-sided weakness.  On presentation, he was hemodynamically stable.  Noncontrast head CT showed new 3.1 cm mass lesion involving the left posterior parietal convexity with large area of surrounding vasogenic edema and associated mass-effect.  CT with contrast confirmed 3.2 x 2.2 x 3 cm intra-axial mass lesion consistent with metastasis.  Started on Keppra, Decadron. Oncology,neurooncology and neurosurgery consulted,underwent stereotactic left frontoparietal cranitomy for resection of tumor on  4/14 PT recommending inpatient rehab on discharge.  Waiting for bed.Medically stable for dc.  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  CT with contrast confirmed 3.2 x 2.2 x 3 cm intra-axial mass lesion consistent with metastasis.   Oncology,neurooncology and neurosurgery consulted. underwent stereotactic left frontoparietal cranitomy for resection of tumor on  4/14 Continue Decadron 6 mg Q12 for now , also on Keppra 500 mg twice daily, will continue Keppra for 1 to 2 weeks postsurgery. Currently alert and oriented but was confused on presentation. He has mild weakness on the right side  Non-small cell lung cancer: Follows with Dr. Julien Nordmann, status post chemoradiation, now on chemotherapy with Imfinzi.  COPD: Currently stable, not in exacerbation.  Continue  bronchodilators.  On room air.  Right-sided weakness: PT recommended CIR on discharge.Looks like he will have difficulty finding a bed at [facility] consulted for possible SNF consideration.         DVT prophylaxis:Lovenox Code Status: Full Family Communication: None at bedside Status is: Inpatient   Dispo: The patient is from: Home              Anticipated d/c is to: CIR              Patient currently is medically stable for discharge   Difficult to place patient No     Consultants: Neurology  Procedures:None  Antimicrobials:  Anti-infectives (From admission, onward)   Start     Dose/Rate Route Frequency Ordered Stop   08/30/20 1800  ceFAZolin (ANCEF) IVPB 2g/100 mL premix        2 g 200 mL/hr over 30 Minutes Intravenous Every 8 hours 08/30/20 1643 08/31/20 0312   08/30/20 1315  ceFAZolin (ANCEF) IVPB 2g/100 mL premix  Status:  Discontinued        2 g 200 mL/hr over 30 Minutes Intravenous To Surgery 08/30/20 1249 08/30/20 1746      Subjective: Patient seen and examined the bedside this morning.  Hemodynamically stable.  Denies any new complaints.  Objective: Vitals:   09/05/20 0731 09/05/20 0822 09/05/20 2048 09/06/20 0332  BP: 118/80  103/62 114/69  Pulse: 81  92 77  Resp: 16  16 16   Temp: 98 F (36.7 C)  98.6 F (37 C) 98.5 F (36.9 C)  TempSrc:   Oral Oral  SpO2: 98% 98% 98% 99%  Weight:      Height:        Intake/Output Summary (Last 24 hours) at 09/06/2020 9485 Last data filed  at 09/05/2020 1100 Gross per 24 hour  Intake --  Output 400 ml  Net -400 ml   Filed Weights   08/23/20 0552  Weight: 56.3 kg    Examination:  General exam: Overall comfortable, not in distress HEENT: PERRL,sutures on the scalp Respiratory system:  no wheezes or crackles  Cardiovascular system: S1 & S2 heard, RRR.  Gastrointestinal system: Abdomen is nondistended, soft and nontender. Central nervous system: Alert and oriented Extremities: No edema, no clubbing ,no  cyanosis Skin: No rashes, no ulcers,no icterus   Data Reviewed: I have personally reviewed following labs and imaging studies  CBC: Recent Labs  Lab 08/31/20 0310 09/01/20 0153  WBC 12.0* 10.7*  NEUTROABS  --  8.7*  HGB 11.9* 11.1*  HCT 37.0* 33.7*  MCV 81.0 79.5*  PLT 236 209   Basic Metabolic Panel: Recent Labs  Lab 08/31/20 0310 09/01/20 0153  NA 132* 134*  K 4.1 4.0  CL 104 103  CO2 22 24  GLUCOSE 109* 158*  BUN 14 14  CREATININE 0.90 0.69  CALCIUM 8.4* 8.6*  MG  --  2.1   GFR: Estimated Creatinine Clearance: 69.9 mL/min (by C-G formula based on SCr of 0.69 mg/dL). Liver Function Tests: No results for input(s): AST, ALT, ALKPHOS, BILITOT, PROT, ALBUMIN in the last 168 hours. No results for input(s): LIPASE, AMYLASE in the last 168 hours. No results for input(s): AMMONIA in the last 168 hours. Coagulation Profile: No results for input(s): INR, PROTIME in the last 168 hours. Cardiac Enzymes: No results for input(s): CKTOTAL, CKMB, CKMBINDEX, TROPONINI in the last 168 hours. BNP (last 3 results) No results for input(s): PROBNP in the last 8760 hours. HbA1C: No results for input(s): HGBA1C in the last 72 hours. CBG: No results for input(s): GLUCAP in the last 168 hours. Lipid Profile: No results for input(s): CHOL, HDL, LDLCALC, TRIG, CHOLHDL, LDLDIRECT in the last 72 hours. Thyroid Function Tests: No results for input(s): TSH, T4TOTAL, FREET4, T3FREE, THYROIDAB in the last 72 hours. Anemia Panel: No results for input(s): VITAMINB12, FOLATE, FERRITIN, TIBC, IRON, RETICCTPCT in the last 72 hours. Sepsis Labs: No results for input(s): PROCALCITON, LATICACIDVEN in the last 168 hours.  No results found for this or any previous visit (from the past 240 hour(s)).       Radiology Studies: No results found.      Scheduled Meds: . Chlorhexidine Gluconate Cloth  6 each Topical Daily  . dexamethasone  4 mg Oral Q12H  . levETIRAcetam  500 mg Oral BID  .  mometasone-formoterol  2 puff Inhalation BID  . pantoprazole  40 mg Oral Q supper  . senna  1 tablet Oral BID  . umeclidinium bromide  1 puff Inhalation Daily   Continuous Infusions:    LOS: 13 days    Time spent: 25 mins.More than 50% of that time was spent in counseling and/or coordination of care.      [name], MD [facility] P4/21/2022, 8:10 AM

## 2020-09-06 NOTE — Progress Notes (Signed)
Inpatient Rehabilitation Admissions Coordinator  Case discussed with Dr. Naaman Plummer. Patient will not have a CIR bed this week available to admit him and he is no longer in need of intensive inpt rehab. I contacted his [place] by phone. She would like to discuss with other church members what assistance they can provide for him at home and would like TOC to follow up with her tomorrow to finalize dispo. I have alerted acute team and TOC, Angela RN CM. We will sign off at this time.  Danne Baxter, RN, MSN Rehab Admissions Coordinator [phone] 09/06/2020 11:10 AM

## 2020-09-06 NOTE — Progress Notes (Signed)
Physical Therapy Treatment Patient Details Name: [name] [name] MRN: [id number] DOB: 17-Feb-1956 Today's Date: 09/06/2020    History of Present Illness 65 y.o. male who presented 08/22/20 with R-sided weakness and numbness. Noncontrast head CT concerning for new 3.1 cm mass lesion involving the left posterior parietal convexity with large area of surrounding vasogenic edema and associated mass-effect. This is consistent with metastatic disease. S/p stereotactic L frontoparietal craniotomy for resection of tumor 4/14. PMH: HTN, anemia, bullous emphysema, COPD, and non-small cell lung cancer status post chemoradiation and now on immunotherapy.    PT Comments    Pt is making excellent progress from PT standpoint and had met most of his goals.  Goals were updated. He does continue to have balance deficits with higher level task, R LE weakness -mild, and decreased DGI score (largely related to lower speed).  Overall, pt is moving well but mainly limited by cognitive deficits - remains unsafe to be home alone.  Did note that CIR has signed off and pt does not have pay source for SNF.  Will need 24 hr supervision at home and [facility] therapies vs outpt therapies pending progress and transportation.     Follow Up Recommendations  Supervision/Assistance - 24 hour;Home health PT (noted CIR signed off on pt, pt does not have insurance, next of kin is looking into supervision at home from church members per chart)     Equipment Recommendations  None recommended by PT    Recommendations for Other Services       Precautions / Restrictions Precautions Precautions: Fall Precaution Comments: R sided weakness    Mobility  Bed Mobility               General bed mobility comments: Pt was in recliner chair.    Transfers Overall transfer level: Needs assistance Equipment used: None Transfers: Sit to/from Stand Sit to Stand: Supervision         General transfer comment: supervision for  safety  Ambulation/Gait Ambulation/Gait assistance: Supervision Gait Distance (Feet): 300 Feet Assistive device: None Gait Pattern/deviations: Step-through pattern;Decreased weight shift to right Gait velocity: reduced   General Gait Details: Mild decrease in weight shift to R.  Progressed to supervision and focused on balance and cognitive task with gait.  See balance and cognition.   Stairs Stairs: Yes Stairs assistance: Min guard Stair Management: Two rails;Step to pattern;Forwards Number of Stairs: 4 General stair comments: Cued up with good (L) and down with bad (R).  Unable to recall sequencing without cues   Wheelchair Mobility    Modified Rankin (Stroke Patients Only) Modified Rankin (Stroke Patients Only) Pre-Morbid Rankin Score: No symptoms Modified Rankin: Moderate disability     Balance Overall balance assessment: Needs assistance   Sitting balance-Leahy Scale: Normal     Standing balance support: No upper extremity supported Standing balance-Leahy Scale: Good Standing balance comment: Performed ambulation without AD; no buckling today               High Level Balance Comments: Walking with head turns up/down/L/R, cognitive task, speed change (minimal), min pertubations all directions.  Pt did not have LOB but moved at slow speed            Cognition Arousal/Alertness: Awake/alert Behavior During Therapy: WFL for tasks assessed/performed Overall Cognitive Status: Impaired/Different from baseline                                 General  Comments: Performed cognitive task with mobility today.  Pt requiring increased time to name 3 animals that began with C and only able to get 2.  Able to count up by 3's with ambulation but with increased time and decreased gait speed.  He needed min cues to find his way back to room. Asked to do 3 task in room - required repetition to remember all 3. Pt was aware of R LE weakness      Exercises  Other Exercises Other Exercises: squat with BUE support on counter x 10 Other Exercises: Standing R LE x 10 hip abd, marching, hip ext wtih support on counter Other Exercises: Cues for correct form with all exercises    General Comments        Pertinent Vitals/Pain Pain Assessment: No/denies pain    Home Living                      Prior Function            PT Goals (current goals can now be found in the care plan section) Acute Rehab PT Goals Patient Stated Goal: to improve and return home PT Goal Formulation: With patient Time For Goal Achievement: 09/20/20 Potential to Achieve Goals: Good Additional Goals Additional Goal #1: Pt will score >19 on DGI to indicate lower fall risk Progress towards PT goals: Progressing toward goals    Frequency    Min 4X/week      PT Plan Discharge plan needs to be updated    Co-evaluation              AM-PAC PT "6 Clicks" Mobility   Outcome Measure  Help needed turning from your back to your side while in a flat bed without using bedrails?: None Help needed moving from lying on your back to sitting on the side of a flat bed without using bedrails?: None Help needed moving to and from a bed to a chair (including a wheelchair)?: A Little Help needed standing up from a chair using your arms (e.g., wheelchair or bedside chair)?: A Little Help needed to walk in hospital room?: A Little Help needed climbing 3-5 steps with a railing? : A Little 6 Click Score: 20    End of Session Equipment Utilized During Treatment: Gait belt Activity Tolerance: Patient tolerated treatment well Patient left: in chair;with call bell/phone within reach;with chair alarm set Nurse Communication: Mobility status PT Visit Diagnosis: Unsteadiness on feet (R26.81);Other abnormalities of gait and mobility (R26.89);Muscle weakness (generalized) (M62.81);Difficulty in walking, not elsewhere classified (R26.2);Other symptoms and signs involving the  nervous system (R29.898);Hemiplegia and hemiparesis Hemiplegia - Right/Left: Right Hemiplegia - dominant/non-dominant: Non-dominant Hemiplegia - caused by:  (tumor resection)     Time: 1308-6578 PT Time Calculation (min) (ACUTE ONLY): 29 min  Charges:  $Gait Training: 8-22 mins $Neuromuscular Re-education: 8-22 mins                     Abran Richard, PT Acute Rehab Services Pager [phone] [facility] 09/06/2020, 6:01 PM

## 2020-09-07 ENCOUNTER — Other Ambulatory Visit: Payer: Self-pay | Admitting: Radiation Therapy

## 2020-09-07 MED ORDER — DEXAMETHASONE 2 MG PO TABS
2.0000 mg | ORAL_TABLET | Freq: Two times a day (BID) | ORAL | Status: DC
  Administered 2020-09-07 – 2020-09-21 (×28): 2 mg via ORAL
  Filled 2020-09-07 (×29): qty 1

## 2020-09-07 NOTE — Progress Notes (Signed)
PROGRESS NOTE  [name] [name]  LGX:[id number] DOB: 02-23-1956 DOA: 08/22/2020 PCP: [name], NP   Chief Complain: Right arm/right leg numbness/weakness  Brief Narrative: Patient is a 64 year old male with history of COPD, non-small cell lung cancer status post chemoradiation, now on immunotherapy, active smoker who presented to the emergency department with further evaluation of right-sided weakness.  On presentation, he was hemodynamically stable.  Noncontrast head CT showed new 3.1 cm mass lesion involving the left posterior parietal convexity with large area of surrounding vasogenic edema and associated mass-effect.  CT with contrast confirmed 3.2 x 2.2 x 3 cm intra-axial mass lesion consistent with metastasis.  Started on Keppra, Decadron. Oncology,neurooncology and neurosurgery consulted,underwent stereotactic left frontoparietal cranitomy for resection of tumor on  4/14 PT initially  recommending inpatient rehab on discharge but now the plan is home with home health.  Unsafe discharge because patient is uninsured, may not get home health and lives alone , does not get 24/7 support as recommended by PT. TOC following.  Medically stable for dc.  Assessment & Plan:   Principal Problem:   Brain metastasis ([place]) Active Problems:   COPD (chronic obstructive pulmonary disease) (HCC)   Malignant neoplasm of bronchus of right upper lobe (HCC)   Brain mass   Brain mass: Presented with right-sided numbness/weakness.  CT with contrast confirmed 3.2 x 2.2 x 3 cm intra-axial mass lesion consistent with metastasis.   Oncology,neurooncology and neurosurgery consulted. underwent stereotactic left frontoparietal cranitomy for resection of tumor on  4/14 Continue Decadron 2 mg Q12 for now , also on Keppra 500 mg twice daily, will continue Keppra as per neurosurgery. Currently alert and oriented but was confused on presentation. He has mild weakness on the right side  Non-small cell lung  cancer: Follows with Dr. Julien Nordmann, status post chemoradiation, now on chemotherapy with Imfinzi.  COPD: Currently stable, not in exacerbation.  Continue bronchodilators.  On room air.  Right-sided weakness:PT initially  recommending inpatient rehab on discharge but now the plan is home with home health. Unsafe discharge because patient is uninsured, may not get home health and lives alone , does not get 24/7 support as recommended by PT.          DVT prophylaxis:Lovenox Code Status: Full Family Communication: None at bedside Status is: Inpatient   Dispo: The patient is from: Home              Anticipated d/c is to: CIR              Patient currently is medically stable for discharge   Difficult to place patient No     Consultants: Neurology  Procedures:None  Antimicrobials:  Anti-infectives (From admission, onward)   Start     Dose/Rate Route Frequency Ordered Stop   08/30/20 1800  ceFAZolin (ANCEF) IVPB 2g/100 mL premix        2 g 200 mL/hr over 30 Minutes Intravenous Every 8 hours 08/30/20 1643 08/31/20 0312   08/30/20 1315  ceFAZolin (ANCEF) IVPB 2g/100 mL premix  Status:  Discontinued        2 g 200 mL/hr over 30 Minutes Intravenous To Surgery 08/30/20 1249 08/30/20 1746      Subjective: Patient seen and examined the bedside this morning.  Denies any new complaints today.  I discussed with him about the recommendation by physical therapist that he might be able to go home but he says that he does not have any support at home and he lives alone  Objective: Vitals:   09/06/20 1300 09/06/20 1937 09/07/20 0436 09/07/20 0727  BP: 110/65 108/70 115/70 118/74  Pulse: 88 96 81 78  Resp:  16 16 17   Temp: 98.6 F (37 C) 97.6 F (36.4 C) 98 F (36.7 C) 98.6 F (37 C)  TempSrc: Oral Oral Oral Oral  SpO2: 98% 97% 98% 98%  Weight:      Height:        Intake/Output Summary (Last 24 hours) at 09/07/2020 8563 Last data filed at 09/06/2020 0946 Gross per 24 hour  Intake  --  Output 250 ml  Net -250 ml   Filed Weights   08/23/20 0552  Weight: 56.3 kg    Examination:  General exam: Overall comfortable, not in distress, pleasant male HEENT: PERRL, sutures on the scalp Respiratory system:  no wheezes or crackles  Cardiovascular system: S1 & S2 heard, RRR.  Gastrointestinal system: Abdomen is nondistended, soft and nontender. Central nervous system: Alert and oriented, mild right-sided weakness Extremities: No edema, no clubbing ,no cyanosis Skin: No rashes, no ulcers,no icterus   Data Reviewed: I have personally reviewed following labs and imaging studies  CBC: Recent Labs  Lab 09/01/20 0153  WBC 10.7*  NEUTROABS 8.7*  HGB 11.1*  HCT 33.7*  MCV 79.5*  PLT 149   Basic Metabolic Panel: Recent Labs  Lab 09/01/20 0153  NA 134*  K 4.0  CL 103  CO2 24  GLUCOSE 158*  BUN 14  CREATININE 0.69  CALCIUM 8.6*  MG 2.1   GFR: Estimated Creatinine Clearance: 69.9 mL/min (by C-G formula based on SCr of 0.69 mg/dL). Liver Function Tests: No results for input(s): AST, ALT, ALKPHOS, BILITOT, PROT, ALBUMIN in the last 168 hours. No results for input(s): LIPASE, AMYLASE in the last 168 hours. No results for input(s): AMMONIA in the last 168 hours. Coagulation Profile: No results for input(s): INR, PROTIME in the last 168 hours. Cardiac Enzymes: No results for input(s): CKTOTAL, CKMB, CKMBINDEX, TROPONINI in the last 168 hours. BNP (last 3 results) No results for input(s): PROBNP in the last 8760 hours. HbA1C: No results for input(s): HGBA1C in the last 72 hours. CBG: No results for input(s): GLUCAP in the last 168 hours. Lipid Profile: No results for input(s): CHOL, HDL, LDLCALC, TRIG, CHOLHDL, LDLDIRECT in the last 72 hours. Thyroid Function Tests: No results for input(s): TSH, T4TOTAL, FREET4, T3FREE, THYROIDAB in the last 72 hours. Anemia Panel: No results for input(s): VITAMINB12, FOLATE, FERRITIN, TIBC, IRON, RETICCTPCT in the last 72  hours. Sepsis Labs: No results for input(s): PROCALCITON, LATICACIDVEN in the last 168 hours.  No results found for this or any previous visit (from the past 240 hour(s)).       Radiology Studies: No results found.      Scheduled Meds: . Chlorhexidine Gluconate Cloth  6 each Topical Daily  . dexamethasone  4 mg Oral Q12H  . levETIRAcetam  500 mg Oral BID  . mometasone-formoterol  2 puff Inhalation BID  . pantoprazole  40 mg Oral Q supper  . senna  1 tablet Oral BID  . umeclidinium bromide  1 puff Inhalation Daily   Continuous Infusions:    LOS: 14 days    Time spent: 25 mins.More than 50% of that time was spent in counseling and/or coordination of care.      [name], MD [facility] P4/22/2022, 8:23 AM

## 2020-09-07 NOTE — Progress Notes (Signed)
Occupational Therapy Treatment Patient Details Name: Nivan Melendrez MRN: [id number] DOB: [birth year] Today's Date: 09/07/2020    History of present illness 65 y.o. male who presented 08/22/20 with R-sided weakness and numbness. Noncontrast head CT concerning for new 3.1 cm mass lesion involving the left posterior parietal convexity with large area of surrounding vasogenic edema and associated mass-effect. This is consistent with metastatic disease. S/p stereotactic L frontoparietal craniotomy for resection of tumor 4/14. PMH: HTN, anemia, bullous emphysema, COPD, and non-small cell lung cancer status post chemoradiation and now on immunotherapy.   OT comments  Patient continues to make steady progress towards goals in skilled OT session. Patient's session encompassed functional mobility, tasks to increase higher level cognition, and playing cards provided to increase Raymond in RUE and increase dexterity. Pt continues to be at supervision level for functional mobility, but requires increased time in order to answer basic questions when completing (name animals that live on a farm). Pt able to demonstrate flipping with playing cards with RUE, however significant difficulty in attempting bimanual task of shuffling. Pt also provided different ways to increase cognition with cards (simple math, matching etc) with demonstration noted. Therapy will continue to follow while in house.    Follow Up Recommendations  SNF    Equipment Recommendations  3 in 1 bedside commode;Tub/shower seat    Recommendations for Other Services      Precautions / Restrictions Precautions Precautions: Fall Precaution Comments: R sided weakness Restrictions Weight Bearing Restrictions: No       Mobility Bed Mobility Overal bed mobility: Modified Independent                  Transfers Overall transfer level: Needs assistance Equipment used: None   Sit to Stand: Supervision         General transfer  comment: supervision for safety    Balance Overall balance assessment: Needs assistance Sitting-balance support: Feet supported;No upper extremity supported Sitting balance-Leahy Scale: Normal Sitting balance - Comments: able to sit EOB/chair without OT assist   Standing balance support: No upper extremity supported Standing balance-Leahy Scale: Good Standing balance comment: Performed ambulation without AD; no buckling today                           ADL either performed or assessed with clinical judgement   ADL Overall ADL's : Needs assistance/impaired                                     Functional mobility during ADLs: Min guard General ADL Comments: session focus on higher level cognitive tasks and [facility] with RUE     Vision       Perception     Praxis      Cognition Arousal/Alertness: Awake/alert Behavior During Therapy: WFL for tasks assessed/performed Overall Cognitive Status: Impaired/Different from baseline Area of Impairment: Following commands;Attention;Awareness                   Current Attention Level: Selective   Following Commands: Follows multi-step commands with increased time;Follows one step commands consistently   Awareness: Intellectual;Emergent   General Comments: pt able to find his way back to his room to date, however continues to demonstrate with recall while completing a functional tasks (name barn animals) pt able to verbalize HEP with theraband and is aware of deficits in RUE and RLE  Exercises Other Exercises Other Exercises: playing cards provided to increase RUE [facility] with shuffling and flipping card tasks   Shoulder Instructions       General Comments      Pertinent Vitals/ Pain       Pain Assessment: No/denies pain  Home Living Family/patient expects to be discharged to:: Private residence Living Arrangements: Alone                                      Prior  Functioning/Environment              Frequency  Min 2X/week        Progress Toward Goals  OT Goals(current goals can now be found in the care plan section)  Progress towards OT goals: Progressing toward goals  Acute Rehab OT Goals Patient Stated Goal: to improve and return home OT Goal Formulation: With patient Time For Goal Achievement: 09/17/20 Potential to Achieve Goals: Good  Plan Discharge plan remains appropriate    Co-evaluation                 AM-PAC OT "6 Clicks" Daily Activity     Outcome Measure   Help from another person eating meals?: None Help from another person taking care of personal grooming?: None Help from another person toileting, which includes using toliet, bedpan, or urinal?: A Little Help from another person bathing (including washing, rinsing, drying)?: A Little Help from another person to put on and taking off regular upper body clothing?: None Help from another person to put on and taking off regular lower body clothing?: A Little 6 Click Score: 21    End of Session Equipment Utilized During Treatment: Gait belt  OT Visit Diagnosis: Unsteadiness on feet (R26.81);Other abnormalities of gait and mobility (R26.89);Muscle weakness (generalized) (M62.81);Apraxia (R48.2);Other symptoms and signs involving cognitive function;Hemiplegia and hemiparesis Hemiplegia - Right/Left: Right Hemiplegia - dominant/non-dominant: Non-Dominant Hemiplegia - caused by: Unspecified   Activity Tolerance Patient tolerated treatment well   Patient Left in chair;with call bell/phone within reach;with chair alarm set   Nurse Communication Mobility status        Time: 4034-7425 OT Time Calculation (min): 25 min  Charges: OT General Charges $OT Visit: 1 Visit OT Treatments $Therapeutic Activity: 23-37 mins  [place]. Shantee Hayne, COTA/L Acute Rehabilitation Services [place] 09/07/2020, 2:41 PM

## 2020-09-07 NOTE — TOC Initial Note (Signed)
Transition of Care [facility]) - Initial/Assessment Note    Patient Details  Name: [name] [name] MRN: [id number] Date of Birth: 07-29-1955  [facility]) CM/SW Contact:    Bethann Berkshire, [place] Phone Number: 09/07/2020, 10:18 AM  Clinical Narrative:                  CSW called pt's friend [name] and explained that pt is no longer CIR appropriate and spoke with her about alternative d/c plans. CSW explained that pt is being recommended [facility] with 24/7 supervision. Vickii Chafe explains that pt lives alone and at this time does not have 24/7 support. She explains she would  Be able to help with his medications and groceries. She is exploring with pt's church community potential supervision assistance but is unable to give an estimated amount of supervision they may be able to provide on a daily basis. She explains that most of the members are working during the day and she is still waiting to hear back about how much supervision/assistance they may be able to provide. She does not feel pt would be safe at home at this time with limited support. CSW explained potential for paying for private care givers out of pocket. Pt is unable to afford this at this time with his income. Unsure if church could provide this type of financial support. 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 She will continue to follow up regarding pt's medicaid status. CSW explained that pt would need charity [facility] and if that was not available may need to be referred to outpatient therapy services. Vickii Chafe is appreciative of the call and will continue to be available for discussing d/c planning. TOC will continue to follow for disposition.   Expected Discharge Plan: [place] Barriers to Discharge: Financial Resources,Inadequate or no insurance   Patient Goals and CMS Choice        Expected Discharge Plan and Services Expected Discharge  Plan: [place]       Living arrangements for the past 2 months: Apartment                                      Prior Living Arrangements/Services Living arrangements for the past 2 months: Apartment Lives with:: Self                   Activities of Daily Living      Permission Sought/Granted                  Emotional Assessment              Admission diagnosis:  Weakness [R53.1] Brain metastases ([place]) [C79.31] Brain mass [G93.89] Patient Active Problem List   Diagnosis Date Noted  . Brain mass 08/24/2020  . Brain metastasis ([place]) 08/23/2020  . Encounter for antineoplastic immunotherapy 04/02/2020  . Chemotherapy induced neutropenia ([place]) 02/28/2020  . Malignant neoplasm of bronchus of right upper lobe ([place]) 01/03/2020  . Encounter for antineoplastic chemotherapy 01/03/2020  . Goals of care, counseling/discussion 01/03/2020  . Malnutrition of moderate degree 12/26/2019  . 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[place]) 12/25/2019  . 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 Hypertension 12/24/2019  . Hyponatremia 12/24/2019  . Tobacco use 11/02/2007  . COPD (chronic obstructive pulmonary disease) ([place]) 11/02/2007  . PULMONARY INFILTRATE INCLUDES (EOSINOPHILIA) 08/23/2007  PCP:  [name], NP Pharmacy:   [facility] [address] [place] [address] Phone: [phone] Fax: [phone]     Social Determinants of Health (SDOH) Interventions    Readmission Risk Interventions No flowsheet data found.

## 2020-09-08 NOTE — Progress Notes (Signed)
PROGRESS NOTE  [name] [name]  KTG:[id number] DOB: Aug 16, 1955 DOA: 08/22/2020 PCP: [name], NP   Chief Complain: Right arm/right leg numbness/weakness  Brief Narrative: Patient is a 65 year old male with history of COPD, non-small cell lung cancer status post chemoradiation, now on immunotherapy, active smoker who presented to the emergency department with further evaluation of right-sided weakness.  On presentation, he was hemodynamically stable.  Noncontrast head CT showed new 3.1 cm mass lesion involving the left posterior parietal convexity with large area of surrounding vasogenic edema and associated mass-effect.  CT with contrast confirmed 3.2 x 2.2 x 3 cm intra-axial mass lesion consistent with metastasis.  Started on Keppra, Decadron. Oncology,neurooncology and neurosurgery consulted,underwent stereotactic left frontoparietal cranitomy for resection of tumor on  4/14 PT initially  recommending inpatient rehab on discharge but now the plan is SNF/ home with home health.  Unsafe discharge because patient is uninsured, may not get home health and lives alone , does not get 24/7 support as recommended by PT. TOC following.  Medically stable for dc.  Assessment & Plan:   Principal Problem:   Brain metastasis ([place]) Active Problems:   COPD (chronic obstructive pulmonary disease) (HCC)   Malignant neoplasm of bronchus of right upper lobe (HCC)   Brain mass   Brain mass: Presented with right-sided numbness/weakness.  CT with contrast confirmed 3.2 x 2.2 x 3 cm intra-axial mass lesion consistent with metastasis.   Oncology,neurooncology and neurosurgery consulted. underwent stereotactic left frontoparietal cranitomy for resection of tumor on  4/14 Continue Decadron 2 mg Q12 for now , also on Keppra 500 mg twice daily, will continue Keppra as per neurosurgery. Currently alert and oriented but was confused on presentation. He has mild weakness on the right side We will check with  neurosurgery when to remove the staples on the head  Non-small cell lung cancer: Follows with Dr. Julien Nordmann, status post chemoradiation, now on chemotherapy with Imfinzi.  COPD: Currently stable, not in exacerbation.  Continue bronchodilators.  On room air.  Right-sided weakness:PT initially  recommending inpatient rehab on discharge but now the plan is home with home health vs SNF Unsafe discharge because patient is uninsured, may not get home health and lives alone , does not get 24/7 support as recommended by PT.          DVT prophylaxis:Lovenox Code Status: Full Family Communication: None at bedside Status is: Inpatient   Dispo: The patient is from: Home              Anticipated d/c is to: Unsure              Patient currently is medically stable for discharge   Difficult to place patient No     Consultants: Neurology  Procedures:None  Antimicrobials:  Anti-infectives (From admission, onward)   Start     Dose/Rate Route Frequency Ordered Stop   08/30/20 1800  ceFAZolin (ANCEF) IVPB 2g/100 mL premix        2 g 200 mL/hr over 30 Minutes Intravenous Every 8 hours 08/30/20 1643 08/31/20 0312   08/30/20 1315  ceFAZolin (ANCEF) IVPB 2g/100 mL premix  Status:  Discontinued        2 g 200 mL/hr over 30 Minutes Intravenous To Surgery 08/30/20 1249 08/30/20 1746      Subjective: Patient seen and examined the bedside this morning.  Hemodynamically stable.  Comfortable.  No new issues  Objective: Vitals:   09/07/20 2031 09/08/20 0620 09/08/20 0755 09/08/20 0816  BP:  115/70  114/65   Pulse:  74 85   Resp:  16 14   Temp:  97.9 F (36.6 C) 98.1 F (36.7 C)   TempSrc:  Oral Oral   SpO2: 98% 100% 100% 96%  Weight:      Height:       No intake or output data in the 24 hours ending 09/08/20 1118 Filed Weights   08/23/20 0552  Weight: 56.3 kg    Examination:  General exam: Overall comfortable, not in distress HEENT: PERRL, sutures on the scalp Respiratory system:   no wheezes or crackles  Cardiovascular system: S1 & S2 heard, RRR.  Gastrointestinal system: Abdomen is nondistended, soft and nontender. Central nervous system: Alert and oriented,mild weakness on the right Extremities: No edema, no clubbing ,no cyanosis Skin: No rashes, no ulcers,no icterus    Data Reviewed: I have personally reviewed following labs and imaging studies  CBC: No results for input(s): WBC, NEUTROABS, HGB, HCT, MCV, PLT in the last 168 hours. Basic Metabolic Panel: No results for input(s): NA, K, CL, CO2, GLUCOSE, BUN, CREATININE, CALCIUM, MG, PHOS in the last 168 hours. GFR: Estimated Creatinine Clearance: 69.9 mL/min (by C-G formula based on SCr of 0.69 mg/dL). Liver Function Tests: No results for input(s): AST, ALT, ALKPHOS, BILITOT, PROT, ALBUMIN in the last 168 hours. No results for input(s): LIPASE, AMYLASE in the last 168 hours. No results for input(s): AMMONIA in the last 168 hours. Coagulation Profile: No results for input(s): INR, PROTIME in the last 168 hours. Cardiac Enzymes: No results for input(s): CKTOTAL, CKMB, CKMBINDEX, TROPONINI in the last 168 hours. BNP (last 3 results) No results for input(s): PROBNP in the last 8760 hours. HbA1C: No results for input(s): HGBA1C in the last 72 hours. CBG: No results for input(s): GLUCAP in the last 168 hours. Lipid Profile: No results for input(s): CHOL, HDL, LDLCALC, TRIG, CHOLHDL, LDLDIRECT in the last 72 hours. Thyroid Function Tests: No results for input(s): TSH, T4TOTAL, FREET4, T3FREE, THYROIDAB in the last 72 hours. Anemia Panel: No results for input(s): VITAMINB12, FOLATE, FERRITIN, TIBC, IRON, RETICCTPCT in the last 72 hours. Sepsis Labs: No results for input(s): PROCALCITON, LATICACIDVEN in the last 168 hours.  No results found for this or any previous visit (from the past 240 hour(s)).       Radiology Studies: No results found.      Scheduled Meds: . Chlorhexidine Gluconate Cloth  6  each Topical Daily  . dexamethasone  2 mg Oral Q12H  . levETIRAcetam  500 mg Oral BID  . mometasone-formoterol  2 puff Inhalation BID  . pantoprazole  40 mg Oral Q supper  . senna  1 tablet Oral BID  . umeclidinium bromide  1 puff Inhalation Daily   Continuous Infusions:    LOS: 15 days    Time spent: 25 mins.More than 50% of that time was spent in counseling and/or coordination of care.      [name], MD [facility] P4/23/2022, 11:18 AM

## 2020-09-09 NOTE — Progress Notes (Signed)
PROGRESS NOTE  [name] [name]  GGE:[id number] DOB: 24-Mar-1956 DOA: 08/22/2020 PCP: [name], NP   Chief Complain: Right arm/right leg numbness/weakness  Brief Narrative: Patient is a 65 year old male with history of COPD, non-small cell lung cancer status post chemoradiation, now on immunotherapy, active smoker who presented to the emergency department with further evaluation of right-sided weakness.  On presentation, he was hemodynamically stable.  Noncontrast head CT showed new 3.1 cm mass lesion involving the left posterior parietal convexity with large area of surrounding vasogenic edema and associated mass-effect.  CT with contrast confirmed 3.2 x 2.2 x 3 cm intra-axial mass lesion consistent with metastasis.  Started on Keppra, Decadron. Oncology,neurooncology and neurosurgery consulted,underwent stereotactic left frontoparietal cranitomy for resection of tumor on  4/14 PT initially  recommending inpatient rehab on discharge but now the plan is SNF/ home with home health.  Unsafe discharge because patient is uninsured, may not get home health and lives alone , does not get 24/7 support as recommended by PT. TOC following.  Medically stable for dc.  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  CT with contrast confirmed 3.2 x 2.2 x 3 cm intra-axial mass lesion consistent with metastasis.   Oncology,neurooncology and neurosurgery consulted. 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 Currently alert and oriented but was confused on presentation. He  has mild weakness on the right side We will check with neurosurgery when to remove the staples on the head  Non-small cell lung cancer: Follows with Dr. Julien Nordmann, status post chemoradiation, now on chemotherapy with Imfinzi.  COPD: Currently stable, not in exacerbation.  Continue bronchodilators.  On room air.  Right-sided weakness:PT initially  recommending inpatient rehab on discharge but now the plan is home with home health vs SNF Unsafe discharge because patient is uninsured, may not get home health and lives alone , does not get 24/7 support as recommended by PT.          DVT prophylaxis:Lovenox Code Status: Full Family Communication:called and  discussed with friend Vickii Chafe on 09/09/20 Status is: Inpatient   Dispo: The patient is from: Home              Anticipated d/c is to: Unsure              Patient currently is medically stable for discharge   Difficult to place patient No     Consultants: Neurology  Procedures:None  Antimicrobials:  Anti-infectives (From admission, onward)   Start     Dose/Rate Route Frequency Ordered Stop   08/30/20 1800  ceFAZolin (ANCEF) IVPB 2g/100 mL premix        2 g 200 mL/hr over 30 Minutes Intravenous Every 8 hours 08/30/20 1643 08/31/20 0312   08/30/20 1315  ceFAZolin (ANCEF) IVPB 2g/100 mL premix  Status:  Discontinued        2 g 200 mL/hr over 30 Minutes Intravenous To Surgery 08/30/20 1249 08/30/20 1746      Subjective:  Patient seen and examined the bedside this morning.  Hemodynamically stable.  No new complaints Objective: Vitals:   09/08/20  6269 09/08/20 1504 09/08/20 2015 09/08/20 2048  BP:  100/63  99/61  Pulse:  80  81  Resp:  14  18  Temp:  98.2 F (36.8 C)  98.8 F (37.1 C)  TempSrc:  Oral    SpO2: 96% 98% 97% 96%  Weight:      Height:        Intake/Output Summary (Last 24 hours) at 09/09/2020 0830 Last data filed at 09/08/2020 1347 Gross per 24 hour  Intake --  Output 200 ml  Net -200 ml   Filed Weights    08/23/20 0552  Weight: 56.3 kg    Examination:  General exam: Overall comfortable, not in distress HEENT: PERRL,dressing on the scalp surgical wound with sutures Respiratory system:  no wheezes or crackles  Cardiovascular system: S1 & S2 heard, RRR.  Gastrointestinal system: Abdomen is nondistended, soft and nontender. Central nervous system: Alert and oriented Extremities: No edema, no clubbing ,no cyanosis Skin: No rashes, no ulcers,no icterus     Data Reviewed: I have personally reviewed following labs and imaging studies  CBC: No results for input(s): WBC, NEUTROABS, HGB, HCT, MCV, PLT in the last 168 hours. Basic Metabolic Panel: No results for input(s): NA, K, CL, CO2, GLUCOSE, BUN, CREATININE, CALCIUM, MG, PHOS in the last 168 hours. GFR: Estimated Creatinine Clearance: 69.9 mL/min (by C-G formula based on SCr of 0.69 mg/dL). Liver Function Tests: No results for input(s): AST, ALT, ALKPHOS, BILITOT, PROT, ALBUMIN in the last 168 hours. No results for input(s): LIPASE, AMYLASE in the last 168 hours. No results for input(s): AMMONIA in the last 168 hours. Coagulation Profile: No results for input(s): INR, PROTIME in the last 168 hours. Cardiac Enzymes: No results for input(s): CKTOTAL, CKMB, CKMBINDEX, TROPONINI in the last 168 hours. BNP (last 3 results) No results for input(s): PROBNP in the last 8760 hours. HbA1C: No results for input(s): HGBA1C in the last 72 hours. CBG: No results for input(s): GLUCAP in the last 168 hours. Lipid Profile: No results for input(s): CHOL, HDL, LDLCALC, TRIG, CHOLHDL, LDLDIRECT in the last 72 hours. Thyroid Function Tests: No results for input(s): TSH, T4TOTAL, FREET4, T3FREE, THYROIDAB in the last 72 hours. Anemia Panel: No results for input(s): VITAMINB12, FOLATE, FERRITIN, TIBC, IRON, RETICCTPCT in the last 72 hours. Sepsis Labs: No results for input(s): PROCALCITON, LATICACIDVEN in the last 168 hours.  No results found for  this or any previous visit (from the past 240 hour(s)).       Radiology Studies: No results found.      Scheduled Meds: . Chlorhexidine Gluconate Cloth  6 each Topical Daily  . dexamethasone  2 mg Oral Q12H  . levETIRAcetam  500 mg Oral BID  . mometasone-formoterol  2 puff Inhalation BID  . pantoprazole  40 mg Oral Q supper  . senna  1 tablet Oral BID  . umeclidinium bromide  1 puff Inhalation Daily   Continuous Infusions:    LOS: 16 days    Time spent: 25 mins.More than 50% of that time was spent in counseling and/or coordination of care.      [name], MD [facility] P4/24/2022, 8:30 AM

## 2020-09-09 NOTE — TOC Progression Note (Signed)
Transition of Care [facility]) - Progression Note    Patient Details  Name: [name] [name] MRN: [id number] Date of Birth: [birth year]  [facility] CM/SW Catharine, [place] Phone Number: 09/09/2020, 11:58 AM  Clinical Narrative:     SW met with pt at bedside to discuss d/c plan. Pt agreeable to SNF but concerns for expected length of stay as he does not want to miss any payments for his apartment. SW explained if pt has concerns may have to make payments prior to d/c or have friends assist with payments. Pt requested SW update Peggy regarding d/c plans.   SW spoke with Vickii Chafe [phone]) who confirmed pt will not have assistance at d/c due to church members and friends working during the day. Peggy agrees pt would benefit from SNF placement at d/c. SW explained will attempt to find bed for pt but pt's self-pay status may create barriers. Peggy verbalized understanding. Peggy reports she is also working with pt on MCD application.   Expected Discharge Plan: [place] Barriers to Discharge: Financial Resources,Inadequate or no insurance  Expected Discharge Plan and Services Expected Discharge Plan: [place] arrangements for the past 2 months: Apartment                                       Social Determinants of Health (SDOH) Interventions    Readmission Risk Interventions No flowsheet data found.

## 2020-09-09 NOTE — Progress Notes (Signed)
Bedside shift report complete. Received patient awake,alert/orientedx4 and able to verbalize needs. NAD noted; respirations easy on room air. Dressing to head c/d/i. Movement noted to all extremities. Decreased sensation/movement to right side noted. Whiteboard updated. All safety measures in place and personal belongings within reach.

## 2020-09-10 NOTE — Progress Notes (Signed)
Occupational Therapy Treatment Patient Details Name: [name] [name] MRN: [id number] DOB: 04/17/1956 Today's Date: 09/10/2020    History of present illness 65 y.o. male who presented 08/22/20 with R-sided weakness and numbness. Noncontrast head CT concerning for new 3.1 cm mass lesion involving the left posterior parietal convexity with large area of surrounding vasogenic edema and associated mass-effect. This is consistent with metastatic disease. S/p stereotactic L frontoparietal craniotomy for resection of tumor 4/14. PMH: HTN, anemia, bullous emphysema, COPD, and non-small cell lung cancer status post chemoradiation and now on immunotherapy.   OT comments  Pt is making progress towards his goals, CIR declined placement therefore session was focused on IADLs necessary for home dc. Pt reported that he pays his bills monthly, traveling to [employer] and [employer] to pay, and sends the others by money order in the mail; pt reported "Vickii Chafe" has been paying his bills online since admission. Pt walks about 1-2 miles for grocery and pharmacy needs.  Pt completed way finding task of making a drink with multistep directions given min guard for safety and min cues for sequencing. Pt requested to walk 1 lap around floor, sup/min guard level with 1 R knee buckle occurrence. Pt RUE continues with decreased coordination however is making functional progress.  Pt contnues to benefit from OT services to progress toward indep in ADL/IADLs and functional mobility. D/c plan should be updated.     Follow Up Recommendations  SNF;Home health OT;Other (comment);Supervision/Assistance - 24 hour (HHOT recommened with 24 hour support; however pt has limited home support. Per chart, "Vickii Chafe" is attempting to find pt support within the chuch. Recommend SNF placement if 24/7 care at home is unavailable.)    Equipment Recommendations  3 in 1 bedside commode;Tub/shower seat       Precautions / Restrictions  Precautions Precautions: Fall Precaution Comments: R sided weakness Restrictions Weight Bearing Restrictions: No       Mobility Bed Mobility Overal bed mobility: Modified Independent             General bed mobility comments: pt in chair upon arrival    Transfers Overall transfer level: Needs assistance Equipment used: None Transfers: Sit to/from Stand Sit to Stand: Supervision         General transfer comment: supervision for safety    Balance Overall balance assessment: Needs assistance Sitting-balance support: Feet supported;No upper extremity supported Sitting balance-Leahy Scale: Normal     Standing balance support: No upper extremity supported Standing balance-Leahy Scale: Good Standing balance comment: ambulated without AD, 1 occurance of r knee buckle                           ADL either performed or assessed with clinical judgement   ADL Overall ADL's : Needs assistance/impaired Eating/Feeding: Supervision/ safety Eating/Feeding Details (indicate cue type and reason): Pt able to follow multistep commands for drink making task, required supervision for safety and verbal cues for sequencing                     Toilet Transfer: Min Psychiatric nurse Details (indicate cue type and reason): R leg buckle noted         Functional mobility during ADLs: Min guard General ADL Comments: Session focused on cognition, with way finding tasks for everyday activities (making drink/food); problem solving transportation; and medical managemnet post d/c. pt required min verbal cues for sequencing way finding task, and min guard/supervision for safety while  completing functional ambulation. pt requesting to walk a lap around floor, reporting he has not walked since friday. pt walked ~357ft with min guard for safety, noted rlight knee buckle on one occasion.     Vision       Perception     Praxis      Cognition Arousal/Alertness:  Awake/alert Behavior During Therapy: WFL for tasks assessed/performed Overall Cognitive Status: Impaired/Different from baseline Area of Impairment: Attention;Following commands;Problem solving                   Current Attention Level: Selective   Following Commands: Follows multi-step commands with increased time;Follows one step commands consistently Safety/Judgement: Decreased awareness of safety;Decreased awareness of deficits Awareness: Intellectual;Emergent Problem Solving: Slow processing;Difficulty sequencing;Requires verbal cues General Comments: pt able to follow multistep direction for simple way finding task, required min verbal cues for sequencing, completed task slowly and deliberatly              General Comments No skin integrity issues noted; gauze over sx sit on head    Pertinent Vitals/ Pain       Pain Assessment: No/denies pain Pain Score: 0-No pain Faces Pain Scale: No hurt Pain Intervention(s): Monitored during session  Home Living Family/patient expects to be discharged to:: Private residence Living Arrangements: Alone Available Help at Discharge: Neighbor;Available PRN/intermittently;Friend(s) Type of Home: Apartment Home Access: Level entry     Home Layout: One level     Bathroom Shower/Tub: Teacher, early years/pre: Standard Bathroom Accessibility: Yes How Accessible: Accessible via walker Home Equipment: Grab bars - tub/shower      Lives With: Alone    Prior Functioning/Environment Level of Independence: Independent        Comments: Pt takes public transportation or walks to stores, he does not drive. Pt independent with all ADLs and mobility without AD/AE. No falls in past 6 months prior to onset of symptoms.   Frequency  Min 2X/week        Progress Toward Goals  OT Goals(current goals can now be found in the care plan section)     Acute Rehab OT Goals Patient Stated Goal: to improve and return home OT  Goal Formulation: With patient Time For Goal Achievement: 09/17/20 Potential to Achieve Goals: Good  Plan         AM-PAC OT "6 Clicks" Daily Activity     Outcome Measure   Help from another person eating meals?: None Help from another person taking care of personal grooming?: None Help from another person toileting, which includes using toliet, bedpan, or urinal?: A Little Help from another person bathing (including washing, rinsing, drying)?: A Little Help from another person to put on and taking off regular upper body clothing?: None Help from another person to put on and taking off regular lower body clothing?: A Little 6 Click Score: 21    End of Session Equipment Utilized During Treatment: Gait belt  OT Visit Diagnosis: Unsteadiness on feet (R26.81);Other abnormalities of gait and mobility (R26.89);Muscle weakness (generalized) (M62.81);Apraxia (R48.2);Other symptoms and signs involving cognitive function;Hemiplegia and hemiparesis Hemiplegia - Right/Left: Right Hemiplegia - dominant/non-dominant: Non-Dominant Hemiplegia - caused by: Unspecified   Activity Tolerance Patient tolerated treatment well   Patient Left in chair;with call bell/phone within reach;with chair alarm set   Nurse Communication Mobility status        Time: 7867-6720 OT Time Calculation (min): 33 min  Charges: OT General Charges $OT Visit: 1 Visit OT Treatments $Self Care/Home Management :  23-37 mins    Bon Dowis A Kye Hedden 09/10/2020, 2:37 PM

## 2020-09-10 NOTE — Progress Notes (Addendum)
Staples on scalp removed as ordered. Surgical site remains clean,dry, intact with no bleeding noted at this time. Open to air.. Pt has no complaints.

## 2020-09-10 NOTE — Progress Notes (Signed)
Physical Therapy Treatment Patient Details Name: [name] [name] MRN: [id number] DOB: May 20, 1955 Today's Date: 09/10/2020    History of Present Illness 65 y.o. male who presented 08/22/20 with R-sided weakness and numbness. Noncontrast head CT concerning for new 3.1 cm mass lesion involving the left posterior parietal convexity with large area of surrounding vasogenic edema and associated mass-effect. This is consistent with metastatic disease. S/p stereotactic L frontoparietal craniotomy for resection of tumor 4/14. PMH: HTN, anemia, bullous emphysema, COPD, and non-small cell lung cancer status post chemoradiation and now on immunotherapy.    PT Comments    Pt was seen for gait and there ex to BLE's with balance skills performed.  Pt worked on tandem gait, headshake with gait, backing up, narrow based standing, tandem standing and single leg stance.  Pt can hold 8 seconds to stand on either leg.  Follow for goals of acute PT.  Follow Up Recommendations  SNF     Equipment Recommendations  None recommended by PT    Recommendations for Other Services Rehab consult     Precautions / Restrictions Precautions Precautions: Fall Precaution Comments: R sided weakness Restrictions Weight Bearing Restrictions: No    Mobility  Bed Mobility               General bed mobility comments: in chair when PT arrived    Transfers Overall transfer level: Needs assistance Equipment used: None Transfers: Sit to/from Stand Sit to Stand: Min guard         General transfer comment: min guard for safety  Ambulation/Gait Ambulation/Gait assistance: Min guard   Assistive device: None Gait Pattern/deviations: Step-through pattern;Decreased stride length;Decreased weight shift to right Gait velocity: reduced Gait velocity interpretation: <1.31 ft/sec, indicative of household ambulator General Gait Details: balancing with gait while turning his head, while backing up needs min guard, stops  with supervision   Stairs             Wheelchair Mobility    Modified Rankin (Stroke Patients Only)       Balance Overall balance assessment: Needs assistance Sitting-balance support: Feet supported;No upper extremity supported Sitting balance-Leahy Scale: Good   Postural control: Right lateral lean;Posterior lean Standing balance support: No upper extremity supported Standing balance-Leahy Scale: Fair Standing balance comment: practiced tandem standing, single leg standing and narrow based standing                            Cognition Arousal/Alertness: Awake/alert Behavior During Therapy: WFL for tasks assessed/performed Overall Cognitive Status: Impaired/Different from baseline Area of Impairment: Problem solving;Awareness;Safety/judgement;Following commands                   Current Attention Level: Selective Memory: Decreased short-term memory Following Commands: Follows one step commands inconsistently;Follows one step commands with increased time Safety/Judgement: Decreased awareness of deficits Awareness: Intellectual;Emergent Problem Solving: Slow processing;Requires verbal cues;Requires tactile cues General Comments: required minor repetition to do standing balance skills      Exercises General Exercises - Lower Extremity Long Arc Quad: Strengthening;10 reps Heel Slides: Strengthening;10 reps Hip ABduction/ADduction: Strengthening;10 reps    General Comments General comments (skin integrity, edema, etc.): pt has sealed wound on his head, no skin breakdown otherwise      Pertinent Vitals/Pain Pain Assessment: No/denies pain Faces Pain Scale: No hurt Pain Intervention(s): Monitored during session    Home Living  Prior Function            PT Goals (current goals can now be found in the care plan section) Acute Rehab PT Goals Patient Stated Goal: go home Progress towards PT goals: Progressing  toward goals    Frequency    Min 4X/week      PT Plan Current plan remains appropriate    Co-evaluation              AM-PAC PT "6 Clicks" Mobility   Outcome Measure  Help needed turning from your back to your side while in a flat bed without using bedrails?: None Help needed moving from lying on your back to sitting on the side of a flat bed without using bedrails?: None Help needed moving to and from a bed to a chair (including a wheelchair)?: A Little Help needed standing up from a chair using your arms (e.g., wheelchair or bedside chair)?: A Little Help needed to walk in hospital room?: A Little Help needed climbing 3-5 steps with a railing? : A Lot 6 Click Score: 19    End of Session Equipment Utilized During Treatment: Gait belt Activity Tolerance: Patient tolerated treatment well Patient left: in chair;with call bell/phone within reach;with chair alarm set Nurse Communication: Mobility status PT Visit Diagnosis: Unsteadiness on feet (R26.81);Other abnormalities of gait and mobility (R26.89);Muscle weakness (generalized) (M62.81);Difficulty in walking, not elsewhere classified (R26.2);Other symptoms and signs involving the nervous system (R29.898);Hemiplegia and hemiparesis Hemiplegia - Right/Left: Right Hemiplegia - dominant/non-dominant: Non-dominant Hemiplegia - caused by: Cerebral infarction     Time: 7253-6644 PT Time Calculation (min) (ACUTE ONLY): 27 min  Charges:  $Gait Training: 8-22 mins $Neuromuscular Re-education: 8-22 mins                   Ramond Dial 09/10/2020, 9:02 PM Mee Hives, PT MS Acute Rehab Dept. Number: [place]

## 2020-09-10 NOTE — Progress Notes (Signed)
Bedside shift report complete. Received patient awake,alert/orientedx4 and able to verbalize needs. NAD noted; respirations easy on room air. Surgical incision site to head c/d/i. Movement noted to all extremities. Decreased sensation/movement to right side noted. Whiteboard updated. All safety measures in place and personal belongings within reach.

## 2020-09-10 NOTE — Progress Notes (Signed)
PROGRESS NOTE  [name] [name]  NLG:[id number] DOB: Oct 13, 1955 DOA: 08/22/2020 PCP: [name], NP   Chief Complain: Right arm/right leg numbness/weakness  Brief Narrative: Patient is a 65 year old male with history of COPD, non-small cell lung cancer status post chemoradiation, now on immunotherapy, active smoker who presented to the emergency department with further evaluation of right-sided weakness.  On presentation, he was hemodynamically stable.  Noncontrast head CT showed new 3.1 cm mass lesion involving the left posterior parietal convexity with large area of surrounding vasogenic edema and associated mass-effect.  CT with contrast confirmed 3.2 x 2.2 x 3 cm intra-axial mass lesion consistent with metastasis.  Started on Keppra, Decadron. Oncology,neurooncology and neurosurgery consulted,underwent stereotactic left frontoparietal cranitomy for resection of tumor on  4/14 PT initially  recommending inpatient rehab on discharge but now the plan is SNF/ home with home health.  Unsafe discharge because patient is uninsured, may not get home health and lives alone , does not get 24/7 support as recommended by PT. TOC following.  Medically stable for dc.  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  CT with contrast confirmed 3.2 x 2.2 x 3 cm intra-axial mass lesion consistent with metastasis.   Oncology,neurooncology and neurosurgery consulted. 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 Currently alert and oriented but was confused on presentation. He  has mild weakness on the right side We will check with neurosurgery when to remove the staples on the head  Non-small cell lung cancer: Follows with Dr. Julien Nordmann, status post chemoradiation, now on chemotherapy with Imfinzi.  COPD: Currently stable, not in exacerbation.  Continue bronchodilators.  On room air.  Right-sided weakness:PT initially  recommending inpatient rehab on discharge but now the plan is home with home health vs SNF Unsafe discharge because patient is uninsured, may not get home health and lives alone , does not get 24/7 support as recommended by PT.          DVT prophylaxis:Lovenox Code Status: Full Family Communication:called and  discussed with friend Vickii Chafe on 09/09/20 Status is: Inpatient   Dispo: The patient is from: Home              Anticipated d/c is to: Unsure              Patient currently is medically stable for discharge   Difficult to place patient No     Consultants: Neurology  Procedures:None  Antimicrobials:  Anti-infectives (From admission, onward)   Start     Dose/Rate Route Frequency Ordered Stop   08/30/20 1800  ceFAZolin (ANCEF) IVPB 2g/100 mL premix        2 g 200 mL/hr over 30 Minutes Intravenous Every 8 hours 08/30/20 1643 08/31/20 0312   08/30/20 1315  ceFAZolin (ANCEF) IVPB 2g/100 mL premix  Status:  Discontinued        2 g 200 mL/hr over 30 Minutes Intravenous To Surgery 08/30/20 1249 08/30/20 1746      Subjective:  Patient seen and examined at the bedside this morning.  Comfortable, no active issues   Objective: Vitals:  09/09/20 1943 09/09/20 2018 09/10/20 0515 09/10/20 0800  BP: 109/68  105/71 107/65  Pulse: 88  80 85  Resp: 16   16  Temp: 97.8 F (36.6 C)  (!) 97.5 F (36.4 C) 97.9 F (36.6 C)  TempSrc: Oral  Oral Oral  SpO2: 100% 98% 98% 99%  Weight:      Height:        Intake/Output Summary (Last 24 hours) at 09/10/2020 0815 Last data filed at 09/10/2020 0802 Gross per 24 hour  Intake 840 ml  Output  1900 ml  Net -1060 ml   Filed Weights   08/23/20 0552  Weight: 56.3 kg    Examination:  General exam: Overall comfortable, not in distress HEENT: PERRL, sutures on the scalp Respiratory system:  no wheezes or crackles  Cardiovascular system: S1 & S2 heard, RRR.  Gastrointestinal system: Abdomen is nondistended, soft and nontender. Central nervous system: Alert and oriented, mild weakness on the right side Extremities: No edema, no clubbing ,no cyanosis Skin: No rashes, no ulcers,no icterus     Data Reviewed: I have personally reviewed following labs and imaging studies  CBC: No results for input(s): WBC, NEUTROABS, HGB, HCT, MCV, PLT in the last 168 hours. Basic Metabolic Panel: No results for input(s): NA, K, CL, CO2, GLUCOSE, BUN, CREATININE, CALCIUM, MG, PHOS in the last 168 hours. GFR: Estimated Creatinine Clearance: 69.9 mL/min (by C-G formula based on SCr of 0.69 mg/dL). Liver Function Tests: No results for input(s): AST, ALT, ALKPHOS, BILITOT, PROT, ALBUMIN in the last 168 hours. No results for input(s): LIPASE, AMYLASE in the last 168 hours. No results for input(s): AMMONIA in the last 168 hours. Coagulation Profile: No results for input(s): INR, PROTIME in the last 168 hours. Cardiac Enzymes: No results for input(s): CKTOTAL, CKMB, CKMBINDEX, TROPONINI in the last 168 hours. BNP (last 3 results) No results for input(s): PROBNP in the last 8760 hours. HbA1C: No results for input(s): HGBA1C in the last 72 hours. CBG: No results for input(s): GLUCAP in the last 168 hours. Lipid Profile: No results for input(s): CHOL, HDL, LDLCALC, TRIG, CHOLHDL, LDLDIRECT in the last 72 hours. Thyroid Function Tests: No results for input(s): TSH, T4TOTAL, FREET4, T3FREE, THYROIDAB in the last 72 hours. Anemia Panel: No results for input(s): VITAMINB12, FOLATE, FERRITIN, TIBC, IRON, RETICCTPCT in the last 72 hours. Sepsis Labs: No results for input(s): PROCALCITON, LATICACIDVEN  in the last 168 hours.  No results found for this or any previous visit (from the past 240 hour(s)).       Radiology Studies: No results found.      Scheduled Meds: . Chlorhexidine Gluconate Cloth  6 each Topical Daily  . dexamethasone  2 mg Oral Q12H  . levETIRAcetam  500 mg Oral BID  . mometasone-formoterol  2 puff Inhalation BID  . pantoprazole  40 mg Oral Q supper  . senna  1 tablet Oral BID  . umeclidinium bromide  1 puff Inhalation Daily   Continuous Infusions:    LOS: 17 days    Time spent: 15 mins.More than 50% of that time was spent in counseling and/or coordination of care.      [name], MD [facility] P4/25/2022, 8:15 AM

## 2020-09-11 NOTE — NC FL2 (Signed)
[place] LEVEL OF CARE SCREENING TOOL     IDENTIFICATION  Patient Name: [name] [name] Birthdate: 1956-02-04 Sex: male Admission Date (Current Location): 08/22/2020  [facility] and [place] Number:  Herbalist and Address:  [facility] [place], [place] [address] [name], [place] [address]      Provider Number:    Attending Physician Name and Address:  [name], MD  Relative Name and Phone Number:  [name] [phone])    Current Level of Care: Hospital Recommended Level of Care: [place] Prior Approval Number:    Date Approved/Denied:   PASRR Number:    Discharge Plan: SNF    Current Diagnoses: Patient Active Problem List   Diagnosis Date Noted  . Brain mass 08/24/2020  . Brain metastasis ([place]) 08/23/2020  . Encounter for antineoplastic immunotherapy 04/02/2020  . Chemotherapy induced neutropenia ([place]) 02/28/2020  . Malignant neoplasm of bronchus of right upper lobe ([place]) 01/03/2020  . Encounter for antineoplastic chemotherapy 01/03/2020  . Goals of care, counseling/discussion 01/03/2020  . Malnutrition of moderate degree 12/26/2019  . 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[place]) 12/25/2019  . 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 Hypertension 12/24/2019  . Hyponatremia 12/24/2019  . Tobacco use 11/02/2007  . COPD (chronic obstructive pulmonary disease) ([place]) 11/02/2007  . PULMONARY INFILTRATE INCLUDES (EOSINOPHILIA) 08/23/2007    Orientation RESPIRATION BLADDER Height & Weight     Self,Time,Situation,Place  Normal Continent Weight: 124 lb 1.9 oz (56.3 kg) Height:  5\' 1"  (154.9 cm)  BEHAVIORAL SYMPTOMS/MOOD NEUROLOGICAL BOWEL NUTRITION STATUS      Continent Diet (see discharge summary)  AMBULATORY STATUS COMMUNICATION OF NEEDS Skin   Limited Assist Verbally Normal                       Personal Care Assistance Level of Assistance  Bathing,Dressing Bathing Assistance: Limited assistance   Dressing Assistance:  Limited assistance     Functional Limitations Info             SPECIAL CARE FACTORS FREQUENCY  PT (By licensed PT),OT (By licensed OT)     PT Frequency: 3-4x/wk OT Frequency: 3-4x/wk            Contractures      Additional Factors Info  Code Status Code Status Info: FULL             Current Medications (09/11/2020):  This is the current hospital active medication list Current Facility-Administered Medications  Medication Dose Route Frequency Provider Last Rate Last Admin  . acetaminophen (TYLENOL) tablet 650 mg  650 mg Oral Q4H PRN Traci Sermon, PA-C   650 mg at 09/11/20 9983   Or  . acetaminophen (TYLENOL) suppository 650 mg  650 mg Rectal Q4H PRN Costella, Vincent J, PA-C      . albuterol (VENTOLIN HFA) 108 (90 Base) MCG/ACT inhaler 2 puff  2 puff Inhalation Q4H PRN Opyd, Ilene Qua, MD      . bisacodyl (DULCOLAX) EC tablet 5 mg  5 mg Oral Daily PRN Costella, Vista Mink, PA-C   5 mg at 09/01/20 0906  . Chlorhexidine Gluconate Cloth 2 % PADS 6 each  6 each Topical Daily Consuella Lose, MD   6 each at 09/10/20 [phone]  . dexamethasone (DECADRON) tablet 2 mg  2 mg Oral Q12H [name], MD   2 mg at 09/11/20 0539  . HYDROcodone-acetaminophen (NORCO/VICODIN) 5-325 MG per tablet 1 tablet  1 tablet Oral Q4H PRN Costella, Vista Mink, PA-C  1 tablet at 09/01/20 2221  . labetalol (NORMODYNE) injection 10-40 mg  10-40 mg Intravenous Q10 min PRN Costella, Vista Mink, PA-C      . levETIRAcetam (KEPPRA) tablet 500 mg  500 mg Oral BID Florencia Reasons, MD   500 mg at 09/11/20 0867  . mometasone-formoterol (DULERA) 100-5 MCG/ACT inhaler 2 puff  2 puff Inhalation BID Opyd, Ilene Qua, MD   2 puff at 09/11/20 0814  . naloxone [facility]) injection 0.08 mg  0.08 mg Intravenous PRN Costella, Vista Mink, PA-C      . ondansetron (ZOFRAN) tablet 4 mg  4 mg Oral Q4H PRN Costella, Vista Mink, PA-C       Or  . ondansetron (ZOFRAN) injection 4 mg  4 mg Intravenous Q4H PRN Costella, Vincent J, PA-C       . pantoprazole (PROTONIX) EC tablet 40 mg  40 mg Oral Q supper Alvira Philips, RPH   40 mg at 09/10/20 1800  . polyethylene glycol (MIRALAX / GLYCOLAX) packet 17 g  17 g Oral Daily PRN Costella, Vista Mink, PA-C      . promethazine (PHENERGAN) tablet 12.5-25 mg  12.5-25 mg Oral Q4H PRN Costella, Vista Mink, PA-C      . senna (SENOKOT) tablet 8.6 mg  1 tablet Oral BID Costella, Vincent J, PA-C   8.6 mg at 09/11/20 6195  . umeclidinium bromide (INCRUSE ELLIPTA) 62.5 MCG/INH 1 puff  1 puff Inhalation Daily Opyd, Ilene Qua, MD   1 puff at 09/11/20 0932     Discharge Medications: Please see discharge summary for a list of discharge medications.  Relevant Imaging Results:  Relevant Lab Results:   Additional Information SSN: [id number], patient is medicaid pending- [facility] will provide letter of guarantee  [employer] F Chantille Navarrete, LCSWA

## 2020-09-11 NOTE — Progress Notes (Addendum)
PROGRESS NOTE  [name] [name]  ENI:[id number] DOB: [birth year] DOA: 08/22/2020 PCP: [name], NP   Chief Complain: Right arm/right leg numbness/weakness  Brief Narrative: Patient is a 66 year old male with history of COPD, non-small cell lung cancer status post chemoradiation, now on immunotherapy, active smoker who presented to the emergency department with further evaluation of right-sided weakness.  On presentation, he was hemodynamically stable.  Noncontrast head CT showed new 3.1 cm mass lesion involving the left posterior parietal convexity with large area of surrounding vasogenic edema and associated mass-effect.  CT with contrast confirmed 3.2 x 2.2 x 3 cm intra-axial mass lesion consistent with metastasis.  Started on Keppra, Decadron. Oncology,neurooncology and neurosurgery consulted,underwent stereotactic left frontoparietal cranitomy for resection of tumor on  4/14 PT initially  recommending inpatient rehab on discharge but now the plan is SNF/ home with home health.  Unsafe discharge because patient is uninsured, may not get home health and lives alone , does not get 24/7 support as recommended by PT. TOC following.  Medically stable for dc.  Assessment & Plan:   Principal Problem:   Brain metastasis ([place]) Active Problems:   COPD (chronic obstructive pulmonary disease) (HCC)   Malignant neoplasm of bronchus of right upper lobe (HCC)   Brain mass   Brain mass: Presented with right-sided numbness/weakness.  CT with contrast confirmed 3.2 x 2.2 x 3 cm intra-axial mass lesion consistent with metastasis.   Oncology,neurooncology and neurosurgery consulted. 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 Currently alert and oriented but was confused on presentation. He  has mild weakness on the right side We removed the staples on the head.  Non-small cell lung cancer: Follows with Dr. Julien Nordmann, status post chemoradiation, now on chemotherapy with Imfinzi.  He needs to follow-up with Dr. Julien Nordmann after discharge.  COPD: Currently stable, not in exacerbation.  Continue bronchodilators.  On room air.  Right-sided weakness:PT initially  recommending inpatient rehab on discharge but now the plan is home with home health vs SNF .Unsafe discharge because patient is uninsured, may not get home health and lives alone , does not get 24/7 support as recommended by PT.          DVT prophylaxis:Lovenox Code Status: Full Family Communication:called and  discussed with friend Vickii Chafe on 09/09/20 Status is: Inpatient   Dispo: The patient is from: Home              Anticipated d/c is to: Unsure              Patient currently is medically stable for discharge   Difficult to place patient No     Consultants: Neurology  Procedures:None  Antimicrobials:  Anti-infectives (From admission, onward)   Start     Dose/Rate Route Frequency Ordered Stop   08/30/20 1800  ceFAZolin (ANCEF) IVPB 2g/100 mL premix        2 g 200 mL/hr over 30 Minutes Intravenous Every 8 hours 08/30/20 1643 08/31/20 0312   08/30/20 1315  ceFAZolin (ANCEF) IVPB 2g/100 mL premix  Status:  Discontinued        2 g 200 mL/hr over 30 Minutes Intravenous To Surgery 08/30/20 1249 08/30/20 1746      Subjective:  Patient seen and examined the bedside this morning.  Hemodynamically stable.  No new complaints today.  No active issues besides placement   Objective: Vitals:   09/10/20 1941 09/11/20 0521 09/11/20 0814 09/11/20 0831  BP: 111/69 112/71  (!) 101/58  Pulse: 88 81 87 89  Resp: 16 16 18 18   Temp: 98.1 F (36.7 C)   98.4 F (36.9 C)  TempSrc: Oral   Oral  SpO2: 98% 100% 98% 100%  Weight:      Height:        Intake/Output Summary (Last 24 hours) at 09/11/2020 1411 Last data filed  at 09/11/2020 0900 Gross per 24 hour  Intake 240 ml  Output 1900 ml  Net -1660 ml   Filed Weights   08/23/20 0552  Weight: 56.3 kg    Examination:  General exam: Overall comfortable, not in distress HEENT: PERRL, healing surgical wound on the scalp Respiratory system:  no wheezes or crackles  Cardiovascular system: S1 & S2 heard, RRR.  Gastrointestinal system: Abdomen is nondistended, soft and nontender. Central nervous system: Alert and oriented, mild weakness on the right side Extremities: No edema, no clubbing ,no cyanosis Skin: No rashes, no ulcers,no icterus   Data Reviewed: I have personally reviewed following labs and imaging studies  CBC: No results for input(s): WBC, NEUTROABS, HGB, HCT, MCV, PLT in the last 168 hours. Basic Metabolic Panel: No results for input(s): NA, K, CL, CO2, GLUCOSE, BUN, CREATININE, CALCIUM, MG, PHOS in the last 168 hours. GFR: Estimated Creatinine Clearance: 69.9 mL/min (by C-G formula based on SCr of 0.69 mg/dL). Liver Function Tests: No results for input(s): AST, ALT, ALKPHOS, BILITOT, PROT, ALBUMIN in the last 168 hours. No results for input(s): LIPASE, AMYLASE in the last 168 hours. No results for input(s): AMMONIA in the last 168 hours. Coagulation Profile: No results for input(s): INR, PROTIME in the last 168 hours. Cardiac Enzymes: No results for input(s): CKTOTAL, CKMB, CKMBINDEX, TROPONINI in the last 168 hours. BNP (last 3 results) No results for input(s): PROBNP in the last 8760 hours. HbA1C: No results for input(s): HGBA1C in the last 72 hours. CBG: No results for input(s): GLUCAP in the last 168 hours. Lipid Profile: No results for input(s): CHOL, HDL, LDLCALC, TRIG, CHOLHDL, LDLDIRECT in the last 72 hours. Thyroid Function Tests: No results for input(s): TSH, T4TOTAL, FREET4, T3FREE, THYROIDAB in the last 72 hours. Anemia Panel: No results for input(s): VITAMINB12, FOLATE, FERRITIN, TIBC, IRON, RETICCTPCT in the last 72  hours. Sepsis Labs: No results for input(s): PROCALCITON, LATICACIDVEN in the last 168 hours.  No results found for this or any previous visit (from the past 240 hour(s)).       Radiology Studies: No results found.      Scheduled Meds: . Chlorhexidine Gluconate Cloth  6 each Topical Daily  . dexamethasone  2 mg Oral Q12H  . levETIRAcetam  500 mg Oral BID  . mometasone-formoterol  2 puff Inhalation BID  . pantoprazole  40 mg Oral Q supper  . senna  1 tablet Oral BID  . umeclidinium bromide  1 puff Inhalation Daily   Continuous Infusions:    LOS: 18 days    Time spent: 15 mins.More than 50% of that time was spent in counseling and/or coordination of care.      [name], MD [facility] P4/26/2022, 2:11 PM

## 2020-09-11 NOTE — Progress Notes (Signed)
Physical Therapy Treatment Patient Details Name: [name] [name] MRN: [id number] DOB: 26-Mar-1956 Today's Date: 09/11/2020    History of Present Illness 65 y.o. male who presented 08/22/20 with R-sided weakness and numbness. Noncontrast head CT concerning for new 3.1 cm mass lesion involving the left posterior parietal convexity with large area of surrounding vasogenic edema and associated mass-effect. This is consistent with metastatic disease. S/p stereotactic L frontoparietal craniotomy for resection of tumor 4/14. PMH: HTN, anemia, bullous emphysema, COPD, and non-small cell lung cancer status post chemoradiation and now on immunotherapy.    PT Comments    Pt is up to side of bed with help and to walk with help.  Pt is demonstrating balance and strength changes esp on RLE.  Recommend pt go to SNF to work on his strength and distances walked, as well as independence to improve mobility.  Follow along for goals of acute PT with pt being encouraged to increase challenges of balance, to increase safety with gait and to increase LE strength.   Follow Up Recommendations  SNF     Equipment Recommendations  None recommended by PT    Recommendations for Other Services       Precautions / Restrictions Precautions Precautions: Fall Precaution Comments: R sided weakness Restrictions Weight Bearing Restrictions: No    Mobility  Bed Mobility Overal bed mobility: Modified Independent             General bed mobility comments: in chair when PT arrived    Transfers Overall transfer level: Needs assistance Equipment used: None Transfers: Sit to/from Stand;Stand Pivot Transfers Sit to Stand: Min guard Stand pivot transfers: Min guard          Ambulation/Gait Ambulation/Gait assistance: Min guard Gait Distance (Feet): 350 Feet Assistive device: None Gait Pattern/deviations: Step-through pattern;Decreased stride length Gait velocity: reduced Gait velocity interpretation: <1.31  ft/sec, indicative of household ambulator General Gait Details: balancing with gait while turning his head, while backing up needs min guard, stops with supervision   Stairs Stairs: Yes Stairs assistance: Min guard Stair Management: Two rails;Step to pattern;Forwards Number of Stairs: 10 General stair comments: pt is using both legs as lead at different times   Wheelchair Mobility    Modified Rankin (Stroke Patients Only)       Balance Overall balance assessment: Needs assistance Sitting-balance support: Feet supported;No upper extremity supported Sitting balance-Leahy Scale: Good   Postural control: Posterior lean;Right lateral lean Standing balance support: No upper extremity supported Standing balance-Leahy Scale: Fair Standing balance comment: tandem gait, headshake gait, alternate stepping of squares of floor tile                            Cognition Arousal/Alertness: Awake/alert Behavior During Therapy: WFL for tasks assessed/performed Overall Cognitive Status: Impaired/Different from baseline Area of Impairment: Problem solving;Awareness;Safety/judgement;Following commands                   Current Attention Level: Selective Memory: Decreased short-term memory Following Commands: Follows one step commands inconsistently;Follows one step commands with increased time Safety/Judgement: Decreased awareness of deficits Awareness: Intellectual;Emergent Problem Solving: Slow processing;Requires verbal cues;Requires tactile cues General Comments: required minor repetition to do standing balance skills      Exercises General Exercises - Lower Extremity Ankle Circles/Pumps: AAROM;5 reps Quad Sets: AAROM;10 reps Long Arc Quad: Strengthening;10 reps Heel Slides: Strengthening;10 reps Hip ABduction/ADduction: Strengthening;10 reps    General Comments General comments (skin integrity, edema, etc.): Pt is  motivated and agreeable with challenges       Pertinent Vitals/Pain Pain Assessment: No/denies pain    Home Living                      Prior Function            PT Goals (current goals can now be found in the care plan section) Acute Rehab PT Goals Patient Stated Goal: go home Progress towards PT goals: Progressing toward goals    Frequency    Min 4X/week      PT Plan Current plan remains appropriate    Co-evaluation              AM-PAC PT "6 Clicks" Mobility   Outcome Measure  Help needed turning from your back to your side while in a flat bed without using bedrails?: A Little Help needed moving from lying on your back to sitting on the side of a flat bed without using bedrails?: A Little Help needed moving to and from a bed to a chair (including a wheelchair)?: A Little Help needed standing up from a chair using your arms (e.g., wheelchair or bedside chair)?: A Little Help needed to walk in hospital room?: A Little Help needed climbing 3-5 steps with a railing? : A Lot 6 Click Score: 17    End of Session Equipment Utilized During Treatment: Gait belt Activity Tolerance: Patient tolerated treatment well Patient left: in chair;with call bell/phone within reach;with chair alarm set Nurse Communication: Mobility status PT Visit Diagnosis: Unsteadiness on feet (R26.81);Other abnormalities of gait and mobility (R26.89);Muscle weakness (generalized) (M62.81);Difficulty in walking, not elsewhere classified (R26.2);Other symptoms and signs involving the nervous system (R29.898);Hemiplegia and hemiparesis Hemiplegia - Right/Left: Right Hemiplegia - dominant/non-dominant: Non-dominant Hemiplegia - caused by: Cerebral infarction     Time: 1449-1511 PT Time Calculation (min) (ACUTE ONLY): 22 min  Charges:  $Neuromuscular Re-education: 8-22 mins                    Ramond Dial 09/11/2020, 8:52 PM Mee Hives, PT MS Acute Rehab Dept. Number: [place]

## 2020-09-11 NOTE — Progress Notes (Addendum)
   I have place SLP orders for cognition eval to assist with discharge planning needs assessment. Patient likely to have some baseline cognition issues, but managed at home independently prior to admit with only intermittent assist of church members.   Danne Baxter, RN, MSN Rehab Admissions Coordinator [phone] 09/11/2020 8:40 AM

## 2020-09-11 NOTE — TOC Progression Note (Signed)
Transition of Care [facility]) - Progression Note    Patient Details  Name: [name] [name] MRN: [id number] Date of Birth: [birth year]  Transition of Care [facility]) CM/SW Contact  Milinda Antis, LCSWA Phone Number: 09/11/2020, 2:32 PM  Clinical Narrative:    CSW consulted leadership for LOG.  FL2 completed and faxed to agencies.    Waiting for bed offer from an agency willing to accept LOG.     Expected Discharge Plan: [place] Barriers to Discharge: Financial Resources,Inadequate or no insurance  Expected Discharge Plan and Services Expected Discharge Plan: [place] arrangements for the past 2 months: Apartment                                       Social Determinants of Health (SDOH) Interventions    Readmission Risk Interventions No flowsheet data found.

## 2020-09-11 NOTE — NC FL2 (Deleted)
[place] LEVEL OF CARE SCREENING TOOL     IDENTIFICATION  Patient Name: [name] [name] Birthdate: [birth year] Sex: male Admission Date (Current Location): 08/22/2020  [facility] and [place] Number:  Herbalist and Address:  [facility], [place] [address] [place], [place] [address]      Provider Number:    Attending Physician Name and Address:  [name], MD  Relative Name and Phone Number:  [name] [phone])    Current Level of Care: Hospital Recommended Level of Care: [place] Prior Approval Number:    Date Approved/Denied:   PASRR Number:    Discharge Plan: SNF    Current Diagnoses: Patient Active Problem List   Diagnosis Date Noted  . Brain mass 08/24/2020  . Brain metastasis ([place]) 08/23/2020  . Encounter for antineoplastic immunotherapy 04/02/2020  . Chemotherapy induced neutropenia ([place]) 02/28/2020  . 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 Encounter for antineoplastic chemotherapy 01/03/2020  . Goals of care, counseling/discussion 01/03/2020  . Malnutrition of moderate degree 12/26/2019  . 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[place]) 12/25/2019  . 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 Hypertension 12/24/2019  . Hyponatremia 12/24/2019  . Tobacco use 11/02/2007  . COPD (chronic obstructive pulmonary disease) ([place]) 11/02/2007  . PULMONARY INFILTRATE INCLUDES (EOSINOPHILIA) 08/23/2007    Orientation RESPIRATION BLADDER Height & Weight     Self,Time,Situation,Place  Normal Continent Weight: 124 lb 1.9 oz (56.3 kg) Height:  5\' 1"  (154.9 cm)  BEHAVIORAL SYMPTOMS/MOOD NEUROLOGICAL BOWEL NUTRITION STATUS      Continent Diet (see discharge summary)  AMBULATORY STATUS COMMUNICATION OF NEEDS Skin   Limited Assist Verbally Normal                       Personal Care Assistance Level of Assistance  Bathing,Dressing Bathing Assistance: Limited assistance   Dressing Assistance:  Limited assistance     Functional Limitations Info             SPECIAL CARE FACTORS FREQUENCY  PT (By licensed PT),OT (By licensed OT)     PT Frequency: 3-4x/wk OT Frequency: 3-4x/wk            Contractures      Additional Factors Info  Code Status Code Status Info: FULL             Current Medications (09/11/2020):  This is the current hospital active medication list Current Facility-Administered Medications  Medication Dose Route Frequency Provider Last Rate Last Admin  . acetaminophen (TYLENOL) tablet 650 mg  650 mg Oral Q4H PRN Traci Sermon, PA-C   650 mg at 09/11/20 6606   Or  . acetaminophen (TYLENOL) suppository 650 mg  650 mg Rectal Q4H PRN Costella, Vincent J, PA-C      . albuterol (VENTOLIN HFA) 108 (90 Base) MCG/ACT inhaler 2 puff  2 puff Inhalation Q4H PRN Opyd, Ilene Qua, MD      . bisacodyl (DULCOLAX) EC tablet 5 mg  5 mg Oral Daily PRN Costella, Vista Mink, PA-C   5 mg at 09/01/20 0906  . Chlorhexidine Gluconate Cloth 2 % PADS 6 each  6 each Topical Daily Consuella Lose, MD   6 each at 09/10/20 [phone]  . dexamethasone (DECADRON) tablet 2 mg  2 mg Oral Q12H [name], MD   2 mg at 09/11/20 0109  . HYDROcodone-acetaminophen (NORCO/VICODIN) 5-325 MG per tablet 1 tablet  1 tablet Oral Q4H PRN Costella, Vista Mink, PA-C  1 tablet at 09/01/20 2221  . labetalol (NORMODYNE) injection 10-40 mg  10-40 mg Intravenous Q10 min PRN Costella, Vista Mink, PA-C      . levETIRAcetam (KEPPRA) tablet 500 mg  500 mg Oral BID Florencia Reasons, MD   500 mg at 09/11/20 0786  . mometasone-formoterol (DULERA) 100-5 MCG/ACT inhaler 2 puff  2 puff Inhalation BID Opyd, Ilene Qua, MD   2 puff at 09/11/20 0814  . naloxone [facility]) injection 0.08 mg  0.08 mg Intravenous PRN Costella, Vista Mink, PA-C      . ondansetron (ZOFRAN) tablet 4 mg  4 mg Oral Q4H PRN Costella, Vista Mink, PA-C       Or  . ondansetron (ZOFRAN) injection 4 mg  4 mg Intravenous Q4H PRN Costella, Vincent J, PA-C       . pantoprazole (PROTONIX) EC tablet 40 mg  40 mg Oral Q supper Alvira Philips, RPH   40 mg at 09/10/20 1800  . polyethylene glycol (MIRALAX / GLYCOLAX) packet 17 g  17 g Oral Daily PRN Costella, Vista Mink, PA-C      . promethazine (PHENERGAN) tablet 12.5-25 mg  12.5-25 mg Oral Q4H PRN Costella, Vista Mink, PA-C      . senna (SENOKOT) tablet 8.6 mg  1 tablet Oral BID Costella, Vincent J, PA-C   8.6 mg at 09/11/20 7544  . umeclidinium bromide (INCRUSE ELLIPTA) 62.5 MCG/INH 1 puff  1 puff Inhalation Daily Opyd, Ilene Qua, MD   1 puff at 09/11/20 9201     Discharge Medications: Please see discharge summary for a list of discharge medications.  Relevant Imaging Results:  Relevant Lab Results:   Additional Information SSN: [id number]  Paulene Floor Dawnmarie Breon, LCSWA

## 2020-09-11 NOTE — Plan of Care (Signed)

## 2020-09-12 DIAGNOSIS — C7931 Secondary malignant neoplasm of brain: Principal | ICD-10-CM

## 2020-09-12 LAB — CBC WITH DIFFERENTIAL/PLATELET
Abs Immature Granulocytes: 0.17 10*3/uL — ABNORMAL HIGH (ref 0.00–0.07)
Basophils Absolute: 0 10*3/uL (ref 0.0–0.1)
Basophils Relative: 0 %
Eosinophils Absolute: 0 10*3/uL (ref 0.0–0.5)
Eosinophils Relative: 0 %
HCT: 33.9 % — ABNORMAL LOW (ref 39.0–52.0)
Hemoglobin: 10.7 g/dL — ABNORMAL LOW (ref 13.0–17.0)
Immature Granulocytes: 3 %
Lymphocytes Relative: 8 %
Lymphs Abs: 0.5 10*3/uL — ABNORMAL LOW (ref 0.7–4.0)
MCH: 25.5 pg — ABNORMAL LOW (ref 26.0–34.0)
MCHC: 31.6 g/dL (ref 30.0–36.0)
MCV: 80.9 fL (ref 80.0–100.0)
Monocytes Absolute: 0.4 10*3/uL (ref 0.1–1.0)
Monocytes Relative: 6 %
Neutro Abs: 5.3 10*3/uL (ref 1.7–7.7)
Neutrophils Relative %: 83 %
Platelets: 272 10*3/uL (ref 150–400)
RBC: 4.19 MIL/uL — ABNORMAL LOW (ref 4.22–5.81)
RDW: 15.6 % — ABNORMAL HIGH (ref 11.5–15.5)
WBC: 6.4 10*3/uL (ref 4.0–10.5)
nRBC: 0 % (ref 0.0–0.2)

## 2020-09-12 LAB — BASIC METABOLIC PANEL
Anion gap: 10 (ref 5–15)
BUN: 13 mg/dL (ref 8–23)
CO2: 25 mmol/L (ref 22–32)
Calcium: 9 mg/dL (ref 8.9–10.3)
Chloride: 102 mmol/L (ref 98–111)
Creatinine, Ser: 0.79 mg/dL (ref 0.61–1.24)
GFR, Estimated: >60 mL/min (ref >60)
Glucose, Bld: 110 mg/dL — ABNORMAL HIGH (ref 70–99)
Potassium: 4.4 mmol/L (ref 3.5–5.1)
Sodium: 137 mmol/L (ref 135–145)

## 2020-09-12 NOTE — Evaluation (Signed)
Speech Language Pathology Evaluation Patient Details Name: [name] [name] MRN: [id number] DOB: [birth year] Today's Date: 09/12/2020 Time: 6761-9509 SLP Time Calculation (min) (ACUTE ONLY): 20 min  Problem List:  Patient Active Problem List   Diagnosis Date Noted  . Brain mass 08/24/2020  . Brain metastasis ([place]) 08/23/2020  . Encounter for antineoplastic immunotherapy 04/02/2020  . Chemotherapy induced neutropenia ([place]) 02/28/2020  . Malignant neoplasm of bronchus of right upper lobe ([place]) 01/03/2020  . Encounter for antineoplastic chemotherapy 01/03/2020  . Goals of care, counseling/discussion 01/03/2020  . Malnutrition of moderate degree 12/26/2019  . 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[place]) 12/25/2019  . 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 Hypertension 12/24/2019  . Hyponatremia 12/24/2019  . Tobacco use 11/02/2007  . COPD (chronic obstructive pulmonary disease) ([place]) 11/02/2007  . PULMONARY INFILTRATE INCLUDES (EOSINOPHILIA) 08/23/2007   Past Medical History:  Past Medical History:  Diagnosis Date  . 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[place]) 12/25/2019  . COPD (chronic obstructive pulmonary disease) ([place]) 11/02/2007   Qualifier: Diagnosis of  By: Melvyn Novas MD, Christena Deem   . Hypertension 12/24/2019  . Iron deficiency anemia 08/23/2007   Qualifier: Diagnosis of  By: Jim Like     Past Surgical History:  Past Surgical History:  Procedure Laterality Date  . APPLICATION OF CRANIAL NAVIGATION N/A 08/30/2020   Procedure: APPLICATION OF CRANIAL NAVIGATION;  Surgeon: Consuella Lose, MD;  Location: [place];  Service: Neurosurgery;  Laterality: N/A;  . CRANIOTOMY Left 08/30/2020   Procedure: Stereotactic left frontoparietal craniectomy for resection of tumor with brainlab;  Surgeon: Consuella Lose, MD;  Location: [place];  Service: Neurosurgery;  Laterality: Left;  . ENDOBRONCHIAL ULTRASOUND N/A 12/27/2019   Procedure: ENDOBRONCHIAL ULTRASOUND;  Surgeon: Laurin Coder, MD;  Location: [facility];  Service:  Endoscopy;  Laterality: N/A;  . FINE NEEDLE ASPIRATION  12/27/2019   Procedure: FINE NEEDLE ASPIRATION;  Surgeon: Laurin Coder, MD;  Location: [facility];  Service: Endoscopy;;  . VIDEO BRONCHOSCOPY N/A 12/27/2019   Procedure: VIDEO BRONCHOSCOPY WITHOUT FLUORO;  Surgeon: Laurin Coder, MD;  Location: [facility];  Service: Endoscopy;  Laterality: N/A;   HPI:  65 y.o. male who presented 08/22/20 with R-sided weakness and numbness. Noncontrast head CT concerning for new 3.1 cm mass lesion involving the left posterior parietal convexity with large area of surrounding vasogenic edema and associated mass-effect. This is consistent with metastatic disease. S/p stereotactic L frontoparietal craniotomy for resection of tumor 4/14. PMH: HTN, anemia, bullous emphysema, COPD, and non-small cell lung cancer status post chemoradiation and now on immunotherapy.   Assessment / Plan / Recommendation Clinical Impression  Pt presents with acute cognitive and communicative changes, scoring 19/30 on the SLUMS (27-30 considered [facility]). Throughout all testing he had difficulty with selective attention, storage/retrieval of new information, planning, and problem solving, although he did show consistent emergent awareness of impairments. Although pt completed structured language tasks well, he describes intermittent word finding difficulties. Intermittent and inconsistent errors in communication appeared to be more related to motor planning. Pt would benefit from SLP f/u acutely and at next level of care, with 24/7 supervision recommended.    SLP Assessment  SLP Recommendation/Assessment: Patient needs continued Speech Lanaguage Pathology Services SLP Visit Diagnosis: Cognitive communication deficit (R41.841)    Follow Up Recommendations  Skilled Nursing facility (could consider [facility] SLP if pt can obtain 24/7 supervision)    Frequency and Duration min 2x/week  2 weeks      SLP Evaluation Cognition  Overall  Cognitive Status: Impaired/Different from baseline Arousal/Alertness: Awake/alert Orientation Level:  Oriented X4 Attention: Selective Selective Attention: Impaired Selective Attention Impairment: Verbal basic Memory: Impaired Memory Impairment: Storage deficit;Retrieval deficit;Decreased recall of new information Awareness: Appears intact (for emergent awareness) Problem Solving: Impaired Problem Solving Impairment: Verbal complex       Comprehension  Auditory Comprehension Overall Auditory Comprehension: Appears within functional limits for tasks assessed    Expression Expression Primary Mode of Expression: Verbal Verbal Expression Overall Verbal Expression: Appears within functional limits for tasks assessed   Oral / Motor  Motor Speech Overall Motor Speech: Impaired Respiration: Within functional limits Phonation: Normal Resonance: Within functional limits Articulation: Impaired Level of Impairment: Conversation Intelligibility: Intelligible Motor Planning: Impaired Level of 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 [place] [facility] Services Pager [phone] Office [phone]  09/12/2020, 12:58 PM

## 2020-09-12 NOTE — Progress Notes (Signed)
Occupational Therapy Treatment Patient Details Name: Astor Gentle MRN: [id number] DOB: [birth year] Today's Date: 09/12/2020    History of present illness 65 y.o. male who presented 08/22/20 with R-sided weakness and numbness. Noncontrast head CT concerning for new 3.1 cm mass lesion involving the left posterior parietal convexity with large area of surrounding vasogenic edema and associated mass-effect. This is consistent with metastatic disease. S/p stereotactic L frontoparietal craniotomy for resection of tumor 4/14. PMH: HTN, anemia, bullous emphysema, COPD, and non-small cell lung cancer status post chemoradiation and now on immunotherapy.   OT comments  Alhassan continues to be pleasant and eager to participate with therapy, progressing towards goals well. Session focused on higher level cognitive tasks via the pill box test. Pt completed a modified version of the pill box task (missign one pill bottle) with 0 errors in 5.04 minutes. He required increased time due to slow and deliberate processing speed for cognitive task. This therapist assessed pt's ability to problem solve how he would complete such IADLs at home, and pt responded with appropriate and accurate information. Pt continues to benefit from OT services to increase indep and safety in all ADL/IADLs. D/c remains appropriate.    Follow Up Recommendations  SNF;Home health OT;Other (comment);Supervision/Assistance - 24 hour    Equipment Recommendations  3 in 1 bedside commode;Tub/shower seat       Precautions / Restrictions Precautions Precautions: Fall Restrictions Weight Bearing Restrictions: No       Mobility Bed Mobility Overal bed mobility: Modified Independent             General bed mobility comments: in chair when OT arrived    Transfers Overall transfer level: Needs assistance Equipment used: None             General transfer comment: min guard for safety    Balance Overall balance assessment:  Needs assistance Sitting-balance support: Feet supported;No upper extremity supported Sitting balance-Leahy Scale: Good Sitting balance - Comments: able to sit EOB/chair without OT assist                     ADL either performed or assessed with clinical judgement   ADL Overall ADL's : Needs assistance/impaired                    Functional mobility during ADLs: Min guard General ADL Comments: Session focused on IADL of medication management and cognitive tasks. Pt is progressing ADL goals well and demonstrates good insight/awareness to safety and limitations in regards to ADLs               Cognition Arousal/Alertness: Awake/alert Behavior During Therapy: WFL for tasks assessed/performed Overall Cognitive Status: Impaired/Different from baseline Area of Impairment: Problem solving;Awareness;Safety/judgement;Following commands            Current Attention Level: Selective   Following Commands: Follows multi-step commands with increased time   Awareness: Intellectual;Emergent Problem Solving: Slow processing General Comments: Pt perform modified pill box test today and demonstrated goo ability to follow written direction however required significant increased time to complete task; pt seems to be accurate historian of past events with impaired recall of short-term memory/events              General Comments pt with improved ability to complete cognitive tasks this session, however continues to demonstrate limitation in his ability to recall short-term memory information    Pertinent Vitals/ Pain       Pain Assessment: Faces Faces Pain Scale: No  hurt  Home Living     Available Help at Discharge: Friend(s) Type of Home: Apartment               Lives With: Alone        Frequency  Min 2X/week        Progress Toward Goals  OT Goals(current goals can now be found in the care plan section)  Progress towards OT goals: Progressing toward  goals  Acute Rehab OT Goals Patient Stated Goal: go home OT Goal Formulation: With patient Time For Goal Achievement: 09/17/20 Potential to Achieve Goals: Good ADL Goals Pt Will Perform Lower Body Bathing: with modified independence;sit to/from stand Pt Will Perform Lower Body Dressing: with modified independence;sit to/from stand Pt Will Transfer to Toilet: with modified independence;ambulating Additional ADL Goal #1: Pt will complete 3 step trail making task in moderately distracting environment with minimal redirectional cues Additional ADL Goal #2: Pt will complete HEP with RUE with min vc to increase functional use RUE  Plan Discharge plan remains appropriate       AM-PAC OT "6 Clicks" Daily Activity     Outcome Measure   Help from another person eating meals?: None Help from another person taking care of personal grooming?: None Help from another person toileting, which includes using toliet, bedpan, or urinal?: A Little Help from another person bathing (including washing, rinsing, drying)?: A Little Help from another person to put on and taking off regular upper body clothing?: None Help from another person to put on and taking off regular lower body clothing?: A Little 6 Click Score: 21    End of Session    OT Visit Diagnosis: Unsteadiness on feet (R26.81);Other abnormalities of gait and mobility (R26.89);Muscle weakness (generalized) (M62.81);Apraxia (R48.2);Other symptoms and signs involving cognitive function;Hemiplegia and hemiparesis Hemiplegia - Right/Left: Right Hemiplegia - dominant/non-dominant: Non-Dominant Hemiplegia - caused by: Unspecified   Activity Tolerance Patient tolerated treatment well   Patient Left in chair;with call bell/phone within reach;with chair alarm set   Nurse Communication Mobility status        Time: 1437 (4401)-0272 OT Time Calculation (min): 18 min  Charges: OT General Charges $OT Visit: 1 Visit OT Treatments $Self Care/Home  Management : 8-22 mins    Dayami Taitt A Marybel Alcott 09/12/2020, 3:59 PM

## 2020-09-12 NOTE — Progress Notes (Signed)
PROGRESS NOTE  [name] [name]  WHQ:[id number] DOB: 1955-12-24 DOA: 08/22/2020 PCP: [name], NP   Chief Complain: Right arm/right leg numbness/weakness  Brief Narrative: Patient is a 65 year old male with history of COPD, non-small cell lung cancer status post chemoradiation, now on immunotherapy, active smoker who presented to the emergency department with further evaluation of right-sided weakness.  On presentation, he was hemodynamically stable.  Noncontrast head CT showed new 3.1 cm mass lesion involving the left posterior parietal convexity with large area of surrounding vasogenic edema and associated mass-effect.  CT with contrast confirmed 3.2 x 2.2 x 3 cm intra-axial mass lesion consistent with metastasis.  Started on Keppra, Decadron. Oncology,neurooncology and neurosurgery consulted,underwent stereotactic left frontoparietal cranitomy for resection of tumor on  4/14 PT initially  recommending inpatient rehab on discharge but now the plan is SNF/ home with home health.  Unsafe discharge because patient is uninsured, may not get home health and lives alone , does not get 24/7 support as recommended by PT. TOC following.  Medically stable for dc.  4/27- no overnight issues. Pt without complaints.  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  CT with contrast confirmed 3.2 x 2.2 x 3 cm intra-axial mass lesion consistent with metastasis.   Oncology,neurooncology and neurosurgery consulted. 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 Currently  alert and oriented but was confused on presentation. He has mild weakness on the right side We removed the staples on the head. 4/27-continue decadron and keppra.   Non-small cell lung cancer: Follows with Dr. Julien Nordmann, status post chemoradiation, now on chemotherapy with Imfinzi.  4/27- needs to f/u with Dr. Julien Nordmann after discharge   COPD: without exacerbation Continue MDI On RA.   Right-sided weakness:PT initially  recommending inpatient rehab on discharge but now the plan is home with home health vs SNF .Unsafe discharge because patient is uninsured, may not get home health and lives alone , does not get 24/7 support as recommended by PT.  4/27-placement pending.          DVT prophylaxis:Lovenox Code Status: Full Family Communication:called and  discussed with friend Vickii Chafe on 09/09/20 Status is: Inpatient   Dispo: The patient is from: Home              Anticipated d/c is to: Unsure              Patient currently is medically stable for discharge   Difficult to place patient No     Consultants: Neurology  Procedures:None  Antimicrobials:  Anti-infectives (From admission, onward)   Start     Dose/Rate Route Frequency Ordered Stop   08/30/20 1800  ceFAZolin (ANCEF) IVPB 2g/100 mL premix        2 g 200 mL/hr over 30 Minutes Intravenous Every 8 hours 08/30/20 1643 08/31/20 0312   08/30/20 1315  ceFAZolin (ANCEF) IVPB 2g/100 mL premix  Status:  Discontinued        2 g 200 mL/hr over 30 Minutes Intravenous To Surgery 08/30/20 1249 08/30/20 1746      Subjective:  No complaints this am.  Denies sob, HA, cp   Objective: Vitals:   09/11/20 2020 09/11/20 2040 09/12/20 0755 09/12/20 0817  BP:  102/61 114/65   Pulse:  95 91   Resp:  16 14   Temp:  98.3 F (36.8 C) 98 F (36.7 C)   TempSrc:  Oral Oral   SpO2: 99% 98% 97% 100%  Weight:      Height:        Intake/Output Summary (Last 24 hours) at 09/12/2020 1416 Last data filed at 09/12/2020 1300 Gross per 24  hour  Intake 720 ml  Output 850 ml  Net -130 ml   Filed Weights   08/23/20 0552  Weight: 56.3 kg    Examination: Nad, calm cta no w/r/r Regular s1/s2 no gallop Soft benign +bs No edema Awake and alert Mood and affect appropriate in current setting  Data Reviewed: I have personally reviewed following labs and imaging studies  CBC: Recent Labs  Lab 09/12/20 0304  WBC 6.4  NEUTROABS 5.3  HGB 10.7*  HCT 33.9*  MCV 80.9  PLT 993   Basic Metabolic Panel: Recent Labs  Lab 09/12/20 0304  NA 137  K 4.4  CL 102  CO2 25  GLUCOSE 110*  BUN 13  CREATININE 0.79  CALCIUM 9.0   GFR: Estimated Creatinine Clearance: 69.9 mL/min (by C-G formula based on SCr of 0.79 mg/dL). Liver Function Tests: No results for input(s): AST, ALT, ALKPHOS, BILITOT, PROT, ALBUMIN in the last 168 hours. No results for input(s): LIPASE, AMYLASE in the last 168 hours. No results for input(s): AMMONIA in the last 168 hours. Coagulation Profile: No results for input(s): INR, PROTIME in the last 168 hours. Cardiac Enzymes: No results for input(s): CKTOTAL, CKMB, CKMBINDEX, TROPONINI in the last 168 hours. BNP (last 3 results) No results for input(s): PROBNP in the last 8760 hours. HbA1C: No results for input(s): HGBA1C in the last 72 hours. CBG: No results for input(s): GLUCAP in the last 168 hours. Lipid Profile: No results for input(s): CHOL, HDL, LDLCALC, TRIG, CHOLHDL, LDLDIRECT in the last 72 hours. Thyroid Function Tests: No results for input(s): TSH, T4TOTAL, FREET4, T3FREE, THYROIDAB in the last 72 hours. Anemia Panel: No results for input(s): VITAMINB12, FOLATE, FERRITIN, TIBC, IRON, RETICCTPCT in the last 72 hours. Sepsis Labs: No results for input(s): PROCALCITON, LATICACIDVEN in the last 168 hours.  No results found for this or any previous visit (from the past 240 hour(s)).       Radiology Studies: No results found.      Scheduled Meds: . Chlorhexidine Gluconate  Cloth  6 each Topical Daily  . dexamethasone  2 mg Oral Q12H  . levETIRAcetam  500 mg Oral BID  . mometasone-formoterol  2 puff Inhalation BID  . pantoprazole  40 mg Oral Q supper  . senna  1 tablet Oral BID  . umeclidinium bromide  1 puff Inhalation Daily   Continuous Infusions:    LOS: 19 days    Time spent: 20 min with >50% on coc.       Nolberto Hanlon, MD [facility] P4/27/2022, 2:16 PM

## 2020-09-12 NOTE — Progress Notes (Signed)
Physical Therapy Treatment Patient Details Name: [name] [name] MRN: [id number] DOB: Mar 03, 1956 Today's Date: 09/12/2020    History of Present Illness 65 y.o. male who presented 08/22/20 with R-sided weakness and numbness. Noncontrast head CT concerning for new 3.1 cm mass lesion involving the left posterior parietal convexity with large area of surrounding vasogenic edema and associated mass-effect. This is consistent with metastatic disease. S/p stereotactic L frontoparietal craniotomy for resection of tumor 4/14. PMH: HTN, anemia, bullous emphysema, COPD, and non-small cell lung cancer status post chemoradiation and now on immunotherapy.    PT Comments    Pt was seen for coordination practice on RLE and strengthening to BLE's.  Gait getting to be more manageable with RLE wb distribution being more even with LLE.  Pt is more stable with gait, stairs are more controlled and safe, and pt is demonstrating more control of excursion of RLE to step and slide LE with control of distance and quality.  Follow for acute PT goals as tolerated.  Follow Up Recommendations  SNF     Equipment Recommendations  None recommended by PT    Recommendations for Other Services       Precautions / Restrictions Precautions Precautions: Fall Precaution Comments: R sided weakness Restrictions Weight Bearing Restrictions: No    Mobility  Bed Mobility                    Transfers Overall transfer level: Needs assistance Equipment used: None Transfers: Sit to/from Stand Sit to Stand: Supervision         General transfer comment: supervised trasnfers fro safety  Ambulation/Gait Ambulation/Gait assistance: Supervision Gait Distance (Feet): 350 Feet   Gait Pattern/deviations: Step-through pattern;Narrow base of support;Decreased stride length Gait velocity: reduced Gait velocity interpretation: <1.31 ft/sec, indicative of household ambulator General Gait Details: simple gait with pt  demonstrating more even WB on RLE   Stairs Stairs: Yes Stairs assistance: Min guard Stair Management: Forwards;Alternating pattern   General stair comments: pt is using both legs as lead at different times   Wheelchair Mobility    Modified Rankin (Stroke Patients Only)       Balance Overall balance assessment: Needs assistance Sitting-balance support: Feet supported Sitting balance-Leahy Scale: Good       Standing balance-Leahy Scale: Fair                              Cognition Arousal/Alertness: Awake/alert Behavior During Therapy: WFL for tasks assessed/performed Overall Cognitive Status: Impaired/Different from baseline Area of Impairment: Problem solving;Awareness;Safety/judgement;Following commands                   Current Attention Level: Selective Memory: Decreased short-term memory Following Commands: Follows multi-step commands with increased time Safety/Judgement: Decreased awareness of deficits Awareness: Intellectual;Emergent Problem Solving: Slow processing        Exercises General Exercises - Lower Extremity Long Arc Quad: Strengthening;10 reps Heel Slides: Strengthening;10 reps Hip ABduction/ADduction: Strengthening;10 reps    General Comments General comments (skin integrity, edema, etc.): pt worked on control of coordination on RLE      Pertinent Vitals/Pain Pain Assessment: Faces Faces Pain Scale: No hurt    Home Living                      Prior Function            PT Goals (current goals can now be found in the care plan section)  Acute Rehab PT Goals Patient Stated Goal: go home Progress towards PT goals: Progressing toward goals    Frequency    Min 4X/week      PT Plan Current plan remains appropriate    Co-evaluation              AM-PAC PT "6 Clicks" Mobility   Outcome Measure  Help needed turning from your back to your side while in a flat bed without using bedrails?:  None Help needed moving from lying on your back to sitting on the side of a flat bed without using bedrails?: A Little Help needed moving to and from a bed to a chair (including a wheelchair)?: A Little Help needed standing up from a chair using your arms (e.g., wheelchair or bedside chair)?: A Little Help needed to walk in hospital room?: A Little Help needed climbing 3-5 steps with a railing? : A Little 6 Click Score: 19    End of Session Equipment Utilized During Treatment: Gait belt Activity Tolerance: Patient tolerated treatment well Patient left: in chair;with call bell/phone within reach;with chair alarm set Nurse Communication: Mobility status PT Visit Diagnosis: Unsteadiness on feet (R26.81);Other abnormalities of gait and mobility (R26.89);Muscle weakness (generalized) (M62.81);Difficulty in walking, not elsewhere classified (R26.2);Other symptoms and signs involving the nervous system (R29.898);Hemiplegia and hemiparesis Hemiplegia - Right/Left: Right Hemiplegia - dominant/non-dominant: Non-dominant Hemiplegia - caused by: Cerebral infarction     Time: 0165-5374 PT Time Calculation (min) (ACUTE ONLY): 29 min  Charges:  $Gait Training: 8-22 mins $Therapeutic Exercise: 8-22 mins                    Ramond Dial 09/12/2020, 10:52 PM Mee Hives, PT MS Acute Rehab Dept. Number: [place] and [place]

## 2020-09-12 NOTE — TOC Progression Note (Signed)
Transition of Care [facility]) - Progression Note    Patient Details  Name: [name] [name] MRN: [id number] Date of Birth: [birth year]  Transition of Care [facility]) CM/SW Contact  [name] [name], [place] Phone Number: 09/12/2020, 4:41 PM  Clinical Narrative:    CSW spoke with [name] [name], emergency contact for patient.  Ms. [name] [name] informed CSW that the patient appears to be at his baseline cognitively and he calls her everyday.   The members of the church are unable to provide 24/hour supervision for the patient, but he does call when he needs things, they go grocery shopping for patient, they prepare medications to be taken by the patient, they set up transportation to and from scheduled appointments for the patient, and if they do not hear from the patient they will go by his home to check on him.    CSW was informed that it was Ms. [name] [name], and the pastor of the church that [name] [name] goes to, who call EMS for him on the day of his hospitalization.  CSW asked if they would be able to get to the patient if he called from home and it was an emergency.  CSW was informed that they would "definitely find a way".  Ms. [name] [name] did have concerns for [name] [name]'s ability to process the information given by medical providers.  CSW inquired about patient's medicaid application. CSW was informed that the information had been taken to DSS, but they have not received any updates.  CSW was given the number to [name] [name] at [phone] to check on the status.  CSW attempted to call the number for [name] [name] but did not receive a response.    CSW called [facility] to inquire about the status of the application.  CSW spoke with [name] [name] at [phone] and was informed that the application was received on 09/10/2020 and was pending approval.  There has not been a case ID number assigned.  The agency has 30 days to process the application.  The patient's medicaid worker is [name] [name] and can be reached at  [phone].  Leadership may be able to provide an LOG for this patient pending a SNF extending a bed offer.  Called [name] [name] and informed her of the above information.    Barrier: insurance coverage and no bed offers.   Expected Discharge Plan: [name] [name] Barriers to Discharge: Financial Resources,Inadequate or no insurance  Expected Discharge Plan and Services Expected Discharge Plan: [place] arrangements for the past 2 months: Apartment                                       Social Determinants of Health (SDOH) Interventions    Readmission Risk Interventions No flowsheet data found.

## 2020-09-12 NOTE — Plan of Care (Signed)

## 2020-09-13 NOTE — Progress Notes (Signed)
Physical Therapy Treatment Patient Details Name: [name] [name] MRN: [id number] DOB: 1956-04-30 Today's Date: 09/13/2020    History of Present Illness 65 y.o. male who presented 08/22/20 with R-sided weakness and numbness. Noncontrast head CT concerning for new 3.1 cm mass lesion involving the left posterior parietal convexity with large area of surrounding vasogenic edema and associated mass-effect. This is consistent with metastatic disease. S/p stereotactic L frontoparietal craniotomy for resection of tumor 4/14. PMH: HTN, anemia, bullous emphysema, COPD, and non-small cell lung cancer status post chemoradiation and now on immunotherapy.    PT Comments    Pt is progressing well towards goals. He ambulated in hallway with 1x LOB requiring min A to correct. Pt performed sit<>stand 3 different ways. He relied heavily on UEs to progress to standing, especially when the LLE was placed at a disadvantage. Pt performed room finding task during gait w/o cueing. Current plan remains appropriate, unless pt is able to obtain 24/7 supervision.      Follow Up Recommendations  SNF     Equipment Recommendations  None recommended by PT    Recommendations for Other Services       Precautions / Restrictions Precautions Precautions: Fall Precaution Comments: R sided weakness Restrictions Weight Bearing Restrictions: No    Mobility  Bed Mobility Overal bed mobility: Modified Independent             General bed mobility comments: in chair on arrival    Transfers Overall transfer level: Needs assistance Equipment used: None Transfers: Sit to/from Stand Sit to Stand: Supervision         General transfer comment: supervision when rising from recliner with use or arm rests  Ambulation/Gait Ambulation/Gait assistance: Supervision;Min assist Gait Distance (Feet): 500 Feet Assistive device: None Gait Pattern/deviations: Step-through pattern;Narrow base of support     General Gait  Details: Pt lacking recipricoal arm swing on the R. 1x LOB at end of gait requiring min A to correct. Otherwise, supervision throughout.   Stairs             Wheelchair Mobility    Modified Rankin (Stroke Patients Only) Modified Rankin (Stroke Patients Only) Pre-Morbid Rankin Score: No symptoms Modified Rankin: Moderate disability     Balance Overall balance assessment: Needs assistance Sitting-balance support: Feet supported Sitting balance-Leahy Scale: Good     Standing balance support: No upper extremity supported Standing balance-Leahy Scale: Fair                   Standardized Balance Assessment Standardized Balance Assessment : Dynamic Gait Index          Cognition Arousal/Alertness: Awake/alert Behavior During Therapy: WFL for tasks assessed/performed Overall Cognitive Status: Impaired/Different from baseline Area of Impairment: Problem solving                             Problem Solving: Slow processing General Comments: Pt able to recall room number and find room with out cueing      Exercises Other Exercises Other Exercises: sit<>stand 3 ways, 5x normal, 5x with LLE to the side, 5x LLE forward.    General Comments        Pertinent Vitals/Pain Pain Score: 0-No pain Faces Pain Scale: No hurt    Home Living                      Prior Function  PT Goals (current goals can now be found in the care plan section) Acute Rehab PT Goals Patient Stated Goal: go home PT Goal Formulation: With patient Time For Goal Achievement: 09/20/20 Potential to Achieve Goals: Good Progress towards PT goals: Progressing toward goals    Frequency    Min 4X/week      PT Plan Current plan remains appropriate    Co-evaluation              AM-PAC PT "6 Clicks" Mobility   Outcome Measure  Help needed turning from your back to your side while in a flat bed without using bedrails?: None Help needed moving  from lying on your back to sitting on the side of a flat bed without using bedrails?: A Little Help needed moving to and from a bed to a chair (including a wheelchair)?: A Little Help needed standing up from a chair using your arms (e.g., wheelchair or bedside chair)?: A Little Help needed to walk in hospital room?: A Little Help needed climbing 3-5 steps with a railing? : A Little 6 Click Score: 19    End of Session Equipment Utilized During Treatment: Gait belt Activity Tolerance: Patient tolerated treatment well Patient left: in chair;with call bell/phone within reach Nurse Communication: Mobility status PT Visit Diagnosis: Unsteadiness on feet (R26.81);Other abnormalities of gait and mobility (R26.89);Muscle weakness (generalized) (M62.81);Difficulty in walking, not elsewhere classified (R26.2);Other symptoms and signs involving the nervous system (R29.898);Hemiplegia and hemiparesis Hemiplegia - Right/Left: Right Hemiplegia - dominant/non-dominant: Non-dominant Hemiplegia - caused by: Cerebral infarction     Time: 7342-8768 PT Time Calculation (min) (ACUTE ONLY): 17 min  Charges:  $Gait Training: 8-22 mins                     Benjiman Core, [place] Pager [phone] Acute Rehab   Allena Katz 09/13/2020, 11:24 AM

## 2020-09-13 NOTE — Plan of Care (Signed)
  Problem: Safety: Goal: Ability to remain free from injury will improve Outcome: Progressing   Problem: Pain Managment: Goal: General experience of comfort will improve Outcome: Progressing   

## 2020-09-13 NOTE — TOC Progression Note (Signed)
Transition of Care [facility]) - Progression Note    Patient Details  Name: [name] [name] MRN: [id number] Date of Birth: 1956-04-27  Transition of Care [facility]) CM/SW Contact  Milinda Antis, LCSWA Phone Number: 09/13/2020, 2:07 PM  Clinical Narrative:    CSW contacted [facility], [facility], [facility], and [facility] to ask if they would accept patient with LOG.  [facility] and [facility] denied.  CSW waiting to hear back from [place] and [place].    Information has also been given to leadership to assist.        Expected Discharge Plan: [place] Barriers to Discharge: Financial Resources,Inadequate or no insurance  Expected Discharge Plan and Services Expected Discharge Plan: [place] arrangements for the past 2 months: Apartment                                       Social Determinants of Health (SDOH) Interventions    Readmission Risk Interventions No flowsheet data found.

## 2020-09-13 NOTE — Progress Notes (Signed)
PROGRESS NOTE  [name] [name]  WNU:[id number] DOB: 1955-05-29 DOA: 08/22/2020 PCP: [name], NP   Chief Complain: Right arm/right leg numbness/weakness  Brief Narrative: Patient is a 65 year old male with history of COPD, non-small cell lung cancer status post chemoradiation, now on immunotherapy, active smoker who presented to the emergency department with further evaluation of right-sided weakness.  On presentation, he was hemodynamically stable.  Noncontrast head CT showed new 3.1 cm mass lesion involving the left posterior parietal convexity with large area of surrounding vasogenic edema and associated mass-effect.  CT with contrast confirmed 3.2 x 2.2 x 3 cm intra-axial mass lesion consistent with metastasis.  Started on Keppra, Decadron. Oncology,neurooncology and neurosurgery consulted,underwent stereotactic left frontoparietal cranitomy for resection of tumor on  4/14 PT initially  recommending inpatient rehab on discharge but now the plan is SNF/ home with home health.  Unsafe discharge because patient is uninsured, may not get home health and lives alone , does not get 24/7 support as recommended by PT. TOC following.  Medically stable for dc.  4/28- no overnight issues. 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  CT with contrast confirmed 3.2 x 2.2 x 3 cm intra-axial mass lesion consistent with metastasis.   Oncology,neurooncology and neurosurgery consulted. 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 Currently alert  and oriented but was confused on presentation. He has mild weakness on the right side We removed the staples on the head. 4/28-continue decadron and keppra   Non-small cell lung cancer: Follows with Dr. Julien Nordmann, status post chemoradiation, now on chemotherapy with Imfinzi.  4/28-needs to fu with Dr. Julien Nordmann after d/c    COPD: without exacerbation On RA Continue inhalers   Right-sided weakness:PT initially  recommending inpatient rehab on discharge but now the plan is home with home health vs SNF .Unsafe discharge because patient is uninsured, may not get home health and lives alone , does not get 24/7 support as recommended by PT.  4/28-placement pending.           DVT prophylaxis:Lovenox Code Status: Full Family Communication:called and  discussed with friend Vickii Chafe on 09/09/20 Status is: Inpatient   Dispo: The patient is from: Home              Anticipated d/c is to: Unsure              Patient currently is medically stable for discharge   Difficult to place patient No     Consultants: Neurology  Procedures:None  Antimicrobials:  Anti-infectives (From admission, onward)   Start     Dose/Rate Route Frequency Ordered Stop   08/30/20 1800  ceFAZolin (ANCEF) IVPB 2g/100 mL premix        2 g 200 mL/hr over 30 Minutes Intravenous Every 8 hours 08/30/20 1643 08/31/20 0312   08/30/20 1315  ceFAZolin (ANCEF) IVPB 2g/100 mL premix  Status:  Discontinued        2 g 200 mL/hr over 30 Minutes Intravenous To Surgery 08/30/20 1249 08/30/20 1746      Subjective:  No complaints  Objective: Vitals:   09/12/20 1526 09/12/20 2121 09/13/20 0825 09/13/20 0900  BP: (!) 95/53 105/60  110/70  Pulse: 77 86    Resp:  16  18  Temp: 97.9 F (36.6 C) 98.4 F (36.9 C)  98.4 F (36.9 C)  TempSrc: Oral Oral  Oral  SpO2: 98% 98% 97% 100%  Weight:      Height:       No intake or output data in the 24 hours ending 09/13/20 1436 Filed Weights   08/23/20 0552  Weight: 56.3 kg     Examination: nad cta no w/r/r rrr s1/s2 Soft benign +bs No edema  Data Reviewed: I have personally reviewed following labs and imaging studies  CBC: Recent Labs  Lab 09/12/20 0304  WBC 6.4  NEUTROABS 5.3  HGB 10.7*  HCT 33.9*  MCV 80.9  PLT 827   Basic Metabolic Panel: Recent Labs  Lab 09/12/20 0304  NA 137  K 4.4  CL 102  CO2 25  GLUCOSE 110*  BUN 13  CREATININE 0.79  CALCIUM 9.0   GFR: Estimated Creatinine Clearance: 69.9 mL/min (by C-G formula based on SCr of 0.79 mg/dL). Liver Function Tests: No results for input(s): AST, ALT, ALKPHOS, BILITOT, PROT, ALBUMIN in the last 168 hours. No results for input(s): LIPASE, AMYLASE in the last 168 hours. No results for input(s): AMMONIA in the last 168 hours. Coagulation Profile: No results for input(s): INR, PROTIME in the last 168 hours. Cardiac Enzymes: No results for input(s): CKTOTAL, CKMB, CKMBINDEX, TROPONINI in the last 168 hours. BNP (last 3 results) No results for input(s): PROBNP in the last 8760 hours. HbA1C: No results for input(s): HGBA1C in the last 72 hours. CBG: No results for input(s): GLUCAP in the last 168 hours. Lipid Profile: No results for input(s): CHOL, HDL, LDLCALC, TRIG, CHOLHDL, LDLDIRECT in the last 72 hours. Thyroid Function Tests: No results for input(s): TSH, T4TOTAL, FREET4, T3FREE, THYROIDAB in the last 72 hours. Anemia Panel: No results for input(s): VITAMINB12, FOLATE, FERRITIN, TIBC, IRON, RETICCTPCT in the last 72 hours. Sepsis Labs: No results for input(s): PROCALCITON, LATICACIDVEN in the last 168 hours.  No results found for this or any previous visit (from the past 240 hour(s)).       Radiology Studies: No results found.      Scheduled Meds: . Chlorhexidine Gluconate Cloth  6 each Topical Daily  . dexamethasone  2 mg Oral Q12H  . levETIRAcetam  500 mg Oral BID  . mometasone-formoterol  2 puff Inhalation BID  . pantoprazole  40 mg Oral Q supper  .  senna  1 tablet Oral BID  . umeclidinium bromide  1 puff Inhalation Daily   Continuous Infusions:    LOS: 20 days    Time spent: 20 min with >50% on coc.       Nolberto Hanlon, MD [facility] P4/28/2022, 2:36 PM

## 2020-09-14 NOTE — Progress Notes (Signed)
Speech Language Pathology Treatment: Cognitive-Linquistic  Patient Details Name: [name] [name] MRN: [id number] DOB: 1956/02/27 Today's Date: 09/14/2020 Time: 8638-1771 SLP Time Calculation (min) (ACUTE ONLY): 13 min  Assessment / Plan / Recommendation Clinical Impression  Pt has good recall of today's events, talking about specific activities that he did in OT this morning that correlate with OT note. He can identify areas of acute impairment physically and cognitively, and he even shows some early anticipatory awareness by acknowledging that he will still need help with certain activities, although he has some trouble identifying specific ones. We specifically focused on his memory today, and although he knows he is having trouble still at times, he has reduced problem solving and flexibility in trying to come up with ways to facilitate his memory. SLP provided education and training related to different memory strategies, as well as Min cues for redirection to task. Pt verbalized his motivation to get better. Will continue to follow acutely.    HPI HPI: 65 y.o. male who presented 08/22/20 with R-sided weakness and numbness. Noncontrast head CT concerning for new 3.1 cm mass lesion involving the left posterior parietal convexity with large area of surrounding vasogenic edema and associated mass-effect. This is consistent with metastatic disease. S/p stereotactic L frontoparietal craniotomy for resection of tumor 4/14. PMH: HTN, anemia, bullous emphysema, COPD, and non-small cell lung cancer status post chemoradiation and now on immunotherapy.      SLP Plan  Continue with current plan of care       Recommendations                   Follow up Recommendations: Skilled Nursing facility SLP Visit Diagnosis: Cognitive communication deficit (H65.790) Plan: Continue with current plan of care       GO                Osie Bond., M.A. [place] Acute Rehabilitation Services Pager  [phone] Office [phone]  09/14/2020, 3:37 PM

## 2020-09-14 NOTE — Progress Notes (Signed)
PROGRESS NOTE  [name] [name]  UJW:[id number] DOB: 05/03/1956 DOA: 08/22/2020 PCP: [name], NP   Chief Complain: Right arm/right leg numbness/weakness  Brief Narrative: Patient is a 65 year old male with history of COPD, non-small cell lung cancer status post chemoradiation, now on immunotherapy, active smoker who presented to the emergency department with further evaluation of right-sided weakness.  On presentation, he was hemodynamically stable.  Noncontrast head CT showed new 3.1 cm mass lesion involving the left posterior parietal convexity with large area of surrounding vasogenic edema and associated mass-effect.  CT with contrast confirmed 3.2 x 2.2 x 3 cm intra-axial mass lesion consistent with metastasis.  Started on Keppra, Decadron. Oncology,neurooncology and neurosurgery consulted,underwent stereotactic left frontoparietal cranitomy for resection of tumor on  4/14 PT initially  recommending inpatient rehab on discharge but now the plan is SNF/ home with home health.  Unsafe discharge because patient is uninsured, may not get home health and lives alone , does not get 24/7 support as recommended by PT. TOC following.  Medically stable for dc.  4/29 no overnight issues.  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  CT with contrast confirmed 3.2 x 2.2 x 3 cm intra-axial mass lesion consistent with metastasis.   Oncology,neurooncology and neurosurgery consulted. 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 Currently  alert and oriented but was confused on presentation. He has mild weakness on the right side We removed the staples on the head. 4/29 continue Decadron and Keppra     Non-small cell lung cancer: Follows with Dr. Julien Nordmann, status post chemoradiation, now on chemotherapy with Imfinzi.  4/29 needs to follow-up with Dr. Julien Nordmann after discharge       COPD: without exacerbation On room air, continue inhalers   Right-sided weakness:PT initially  recommending inpatient rehab on discharge but now the plan is home with home health vs SNF .Unsafe discharge because patient is uninsured, may not get home health and lives alone , does not get 24/7 support as recommended by PT.  4/29 placement pending          DVT prophylaxis:Lovenox Code Status: Full Family Communication: None at bedside   Status is: Inpatient   Dispo: The patient is from: Home              Anticipated d/c is to: Unsure              Patient currently is medically stable for discharge   Difficult to place patient No     Consultants: Neurology  Procedures:None  Antimicrobials:  Anti-infectives (From admission, onward)   Start     Dose/Rate Route Frequency Ordered Stop   08/30/20 1800  ceFAZolin (ANCEF) IVPB 2g/100 mL premix        2 g 200 mL/hr over 30 Minutes Intravenous Every 8 hours 08/30/20 1643 08/31/20 0312   08/30/20 1315  ceFAZolin (ANCEF) IVPB 2g/100 mL premix  Status:  Discontinued        2 g 200 mL/hr over 30 Minutes Intravenous To Surgery 08/30/20 1249 08/30/20 1746  Subjective: No complaints this AM  Objective: Vitals:   09/14/20 0525 09/14/20 0810 09/14/20 0838 09/14/20 1440  BP: 109/68  112/62 109/66  Pulse: 78 91 92 87  Resp: 18 18 17 17   Temp: 98.4 F (36.9 C)  98.4 F (36.9 C) 97.8 F (36.6 C)  TempSrc: Oral  Oral Oral  SpO2: 99% 98% 97% 100%  Weight:      Height:        Intake/Output Summary (Last 24 hours) at 09/14/2020 1551 Last data filed at 09/14/2020 0841 Gross per  24 hour  Intake --  Output 1600 ml  Net -1600 ml   Filed Weights   08/23/20 0552  Weight: 56.3 kg    Examination: NAD sitting in chair CTA no wheeze rales rhonchi's Regular S1-S2 no gallops Soft benign positive bowel sounds No edema Awake and alert  Data Reviewed: I have personally reviewed following labs and imaging studies  CBC: Recent Labs  Lab 09/12/20 0304  WBC 6.4  NEUTROABS 5.3  HGB 10.7*  HCT 33.9*  MCV 80.9  PLT 283   Basic Metabolic Panel: Recent Labs  Lab 09/12/20 0304  NA 137  K 4.4  CL 102  CO2 25  GLUCOSE 110*  BUN 13  CREATININE 0.79  CALCIUM 9.0   GFR: Estimated Creatinine Clearance: 69.9 mL/min (by C-G formula based on SCr of 0.79 mg/dL). Liver Function Tests: No results for input(s): AST, ALT, ALKPHOS, BILITOT, PROT, ALBUMIN in the last 168 hours. No results for input(s): LIPASE, AMYLASE in the last 168 hours. No results for input(s): AMMONIA in the last 168 hours. Coagulation Profile: No results for input(s): INR, PROTIME in the last 168 hours. Cardiac Enzymes: No results for input(s): CKTOTAL, CKMB, CKMBINDEX, TROPONINI in the last 168 hours. BNP (last 3 results) No results for input(s): PROBNP in the last 8760 hours. HbA1C: No results for input(s): HGBA1C in the last 72 hours. CBG: No results for input(s): GLUCAP in the last 168 hours. Lipid Profile: No results for input(s): CHOL, HDL, LDLCALC, TRIG, CHOLHDL, LDLDIRECT in the last 72 hours. Thyroid Function Tests: No results for input(s): TSH, T4TOTAL, FREET4, T3FREE, THYROIDAB in the last 72 hours. Anemia Panel: No results for input(s): VITAMINB12, FOLATE, FERRITIN, TIBC, IRON, RETICCTPCT in the last 72 hours. Sepsis Labs: No results for input(s): PROCALCITON, LATICACIDVEN in the last 168 hours.  No results found for this or any previous visit (from the past 240 hour(s)).       Radiology Studies: No results found.      Scheduled Meds: . Chlorhexidine Gluconate  Cloth  6 each Topical Daily  . dexamethasone  2 mg Oral Q12H  . levETIRAcetam  500 mg Oral BID  . mometasone-formoterol  2 puff Inhalation BID  . pantoprazole  40 mg Oral Q supper  . senna  1 tablet Oral BID  . umeclidinium bromide  1 puff Inhalation Daily   Continuous Infusions:    LOS: 21 days    Time spent: 20 min with >50% on coc.       Nolberto Hanlon, MD [facility] P4/29/2022, 3:51 PM

## 2020-09-14 NOTE — TOC Progression Note (Signed)
Transition of Care [facility]) - Progression Note    Patient Details  Name: Ivo Moga MRN: [id number] Date of Birth: 04-30-1956  [facility] CM/SW Contact  Milinda Antis, LCSWA Phone Number: 09/14/2020, 4:02 PM  Clinical Narrative:    CSW spoke with Antoinette with [facility] and Maudie Mercury with [facility].  Both stated that they could possibly take the patient with an LOG.  CSW faxed the FL2 to these agencies again with a comment informing them that this patient would come with an LOG.    Patient has been transferred to the DTP team.     Expected Discharge Plan: [place] Barriers to Discharge: Financial Resources,Inadequate or no insurance  Expected Discharge Plan and Services Expected Discharge Plan: [place] arrangements for the past 2 months: Apartment                                       Social Determinants of Health (SDOH) Interventions    Readmission Risk Interventions No flowsheet data found.

## 2020-09-14 NOTE — Progress Notes (Signed)
Occupational Therapy Treatment Patient Details Name: [name] [name] MRN: [id number] DOB: Jan 14, 1956 Today's Date: 09/14/2020    History of present illness 65 y.o. male who presented 08/22/20 with R-sided weakness and numbness. Noncontrast head CT concerning for new 3.1 cm mass lesion involving the left posterior parietal convexity with large area of surrounding vasogenic edema and associated mass-effect. This is consistent with metastatic disease. S/p stereotactic L frontoparietal craniotomy for resection of tumor 4/14. PMH: HTN, anemia, bullous emphysema, COPD, and non-small cell lung cancer status post chemoradiation and now on immunotherapy.   OT comments  Pt is progressing towards his goals. He was min guard for ambulation without device this session for safety. Pt completed theraputty exercises with yellow soft putty to promote increase in strength and coordination of R hand. HEP of putty exercises was left in pt room, pt encouraged to complete 3x/day. Pt demonstrated great ability to follow multistep directions this session and continues to progress in endurance. Pt continues to benefit from acute OT services to progress function of R hand in ADLs, and overall mobility. D/c remains appropriate.    Follow Up Recommendations  SNF;Supervision/Assistance - 24 hour    Equipment Recommendations  3 in 1 bedside commode       Precautions / Restrictions Precautions Precautions: Fall Precaution Comments: R sided weakness Restrictions Weight Bearing Restrictions: No       Mobility Bed Mobility Overal bed mobility: Modified Independent             General bed mobility comments: in chair on arrival    Transfers Overall transfer level: Needs assistance Equipment used: None Transfers: Sit to/from Stand Sit to Stand: Supervision Stand pivot transfers: Min guard            Balance Overall balance assessment: Needs assistance Sitting-balance support: Feet supported Sitting  balance-Leahy Scale: Good Sitting balance - Comments: able to sit EOB/chair without OT assist   Standing balance support: No upper extremity supported Standing balance-Leahy Scale: Fair                   ADL either performed or assessed with clinical judgement   ADL Overall ADL's : Needs assistance/impaired               General ADL Comments: This session was focused on R hand strengthening and coordination however pt has great insight and understanding into his limitations in regards to ADLs               Cognition Arousal/Alertness: Awake/alert Behavior During Therapy: WFL for tasks assessed/performed Overall Cognitive Status: Impaired/Different from baseline Area of Impairment: Problem solving                   Current Attention Level: Selective Memory: Decreased short-term memory Following Commands: Follows multi-step commands with increased time Safety/Judgement: Decreased awareness of deficits Awareness: Intellectual;Emergent Problem Solving: Slow processing          Exercises Exercises: Hand exercises Other Exercises Other Exercises: Theraputty exercises with "yellow soft": R hand squeezes with 5 second holds, R hand log roll out, R thumb to each digit squeezes with bimaual use to create ball inbetween each repetition of all exercises.      General Comments No skin integrity issues noted    Pertinent Vitals/ Pain       Pain Assessment: Faces Pain Score: 0-No pain Faces Pain Scale: No hurt Pain Intervention(s): Monitored during session         Frequency  Min 2X/week  Progress Toward Goals  OT Goals(current goals can now be found in the care plan section)  Progress towards OT goals: Progressing toward goals  Acute Rehab OT Goals Patient Stated Goal: go home OT Goal Formulation: With patient Time For Goal Achievement: 09/17/20 Potential to Achieve Goals: Good ADL Goals Pt Will Perform Lower Body Bathing: with modified  independence;sit to/from stand Pt Will Perform Lower Body Dressing: with modified independence;sit to/from stand Pt Will Transfer to Toilet: with modified independence;ambulating Additional ADL Goal #1: Pt will complete 3 step trail making task in moderately distracting environment with minimal redirectional cues Additional ADL Goal #2: Pt will complete HEP with RUE with min vc to increase functional use RUE  Plan Discharge plan remains appropriate       AM-PAC OT "6 Clicks" Daily Activity     Outcome Measure   Help from another person eating meals?: None Help from another person taking care of personal grooming?: None Help from another person toileting, which includes using toliet, bedpan, or urinal?: A Little Help from another person bathing (including washing, rinsing, drying)?: A Little Help from another person to put on and taking off regular upper body clothing?: None Help from another person to put on and taking off regular lower body clothing?: A Little 6 Click Score: 21    End of Session Equipment Utilized During Treatment: Gait belt  OT Visit Diagnosis: Unsteadiness on feet (R26.81);Other abnormalities of gait and mobility (R26.89);Muscle weakness (generalized) (M62.81);Apraxia (R48.2);Other symptoms and signs involving cognitive function;Hemiplegia and hemiparesis Hemiplegia - Right/Left: Right Hemiplegia - dominant/non-dominant: Non-Dominant Hemiplegia - caused by: Unspecified   Activity Tolerance Patient tolerated treatment well   Patient Left in chair;with call bell/phone within reach;with chair alarm set   Nurse Communication Mobility status        Time: 1517-6160 OT Time Calculation (min): 22 min  Charges: OT General Charges $OT Visit: 1 Visit OT Treatments $Therapeutic Exercise: 8-22 mins     Joanette Silveria A Lelaina Oatis 09/14/2020, 12:58 PM

## 2020-09-15 NOTE — Progress Notes (Signed)
PROGRESS NOTE  [name] [name]  GNO:[id number] DOB: 1955-10-19 DOA: 08/22/2020 PCP: [name], NP   Chief Complain: Right arm/right leg numbness/weakness  Brief Narrative: Patient is a 65 year old male with history of COPD, non-small cell lung cancer status post chemoradiation, now on immunotherapy, active smoker who presented to the emergency department with further evaluation of right-sided weakness.  On presentation, he was hemodynamically stable.  Noncontrast head CT showed new 3.1 cm mass lesion involving the left posterior parietal convexity with large area of surrounding vasogenic edema and associated mass-effect.  CT with contrast confirmed 3.2 x 2.2 x 3 cm intra-axial mass lesion consistent with metastasis.  Started on Keppra, Decadron. Oncology,neurooncology and neurosurgery consulted,underwent stereotactic left frontoparietal cranitomy for resection of tumor on  4/14 PT initially  recommending inpatient rehab on discharge but now the plan is SNF/ home with home health.  Unsafe discharge because patient is uninsured, may not get home health and lives alone , does not get 24/7 support as recommended by PT. TOC following.  Medically stable for dc.  4/30 no overnight issues.  Patient has no complaints  Assessment & Plan:   Principal Problem:   Brain metastasis ([place]) Active Problems:   COPD (chronic obstructive pulmonary disease) (HCC)   Malignant neoplasm of bronchus of right upper lobe (HCC)   Brain mass   Brain mass: Presented with right-sided numbness/weakness.  CT with contrast confirmed 3.2 x 2.2 x 3 cm intra-axial mass lesion consistent with metastasis.   Oncology,neurooncology and neurosurgery consulted. 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 Currently  alert and oriented but was confused on presentation. He has mild weakness on the right side We removed the staples on the head. 4/30 continue Decadron and Keppra    Non-small cell lung cancer: Follows with Dr. Julien Nordmann, status post chemoradiation, now on chemotherapy with Imfinzi.  4/30 needs to follow-up with Dr. Julien Nordmann after discharge         COPD: without exacerbation On room air continue inhalers   Right-sided weakness:PT initially  recommending inpatient rehab on discharge but now the plan is home with home health vs SNF .Unsafe discharge because patient is uninsured, may not get home health and lives alone , does not get 24/7 support as recommended by PT.  4/30 placement pending          DVT prophylaxis:Lovenox Code Status: Full Family Communication: None at bedside   Status is: Inpatient   Dispo: The patient is from: Home              Anticipated d/c is to: Unsure              Patient currently is medically stable for discharge   Difficult to place patient No     Consultants: Neurology  Procedures:None  Antimicrobials:  Anti-infectives (From admission, onward)   Start     Dose/Rate Route Frequency Ordered Stop   08/30/20 1800  ceFAZolin (ANCEF) IVPB 2g/100 mL premix        2 g 200 mL/hr over 30 Minutes Intravenous Every 8 hours 08/30/20 1643 08/31/20 0312   08/30/20 1315  ceFAZolin (ANCEF) IVPB 2g/100 mL premix  Status:  Discontinued        2 g 200 mL/hr over 30 Minutes Intravenous To Surgery 08/30/20 1249 08/30/20 1746  Subjective: Has no complaints  Objective: Vitals:   09/14/20 2002 09/15/20 0530 09/15/20 0752 09/15/20 0814  BP:  106/70 100/64   Pulse:  78 87 78  Resp:  14 17 18   Temp:  97.8 F (36.6 C) 98 F (36.7 C)   TempSrc:  Oral Oral   SpO2: 100% 100% 98% 99%  Weight:      Height:        Intake/Output Summary (Last 24 hours) at 09/15/2020 1420 Last data filed at 09/15/2020 0900 Gross per 24 hour  Intake 240 ml  Output  2500 ml  Net -2260 ml   Filed Weights   08/23/20 0552  Weight: 56.3 kg    Examination: NAD, calm CTA no wheeze rales rhonchi's Regular S1-S2 no gallops Soft benign positive bowel sounds No edema Awake and alert, grossly intact  Data Reviewed: I have personally reviewed following labs and imaging studies  CBC: Recent Labs  Lab 09/12/20 0304  WBC 6.4  NEUTROABS 5.3  HGB 10.7*  HCT 33.9*  MCV 80.9  PLT 102   Basic Metabolic Panel: Recent Labs  Lab 09/12/20 0304  NA 137  K 4.4  CL 102  CO2 25  GLUCOSE 110*  BUN 13  CREATININE 0.79  CALCIUM 9.0   GFR: Estimated Creatinine Clearance: 69.9 mL/min (by C-G formula based on SCr of 0.79 mg/dL). Liver Function Tests: No results for input(s): AST, ALT, ALKPHOS, BILITOT, PROT, ALBUMIN in the last 168 hours. No results for input(s): LIPASE, AMYLASE in the last 168 hours. No results for input(s): AMMONIA in the last 168 hours. Coagulation Profile: No results for input(s): INR, PROTIME in the last 168 hours. Cardiac Enzymes: No results for input(s): CKTOTAL, CKMB, CKMBINDEX, TROPONINI in the last 168 hours. BNP (last 3 results) No results for input(s): PROBNP in the last 8760 hours. HbA1C: No results for input(s): HGBA1C in the last 72 hours. CBG: No results for input(s): GLUCAP in the last 168 hours. Lipid Profile: No results for input(s): CHOL, HDL, LDLCALC, TRIG, CHOLHDL, LDLDIRECT in the last 72 hours. Thyroid Function Tests: No results for input(s): TSH, T4TOTAL, FREET4, T3FREE, THYROIDAB in the last 72 hours. Anemia Panel: No results for input(s): VITAMINB12, FOLATE, FERRITIN, TIBC, IRON, RETICCTPCT in the last 72 hours. Sepsis Labs: No results for input(s): PROCALCITON, LATICACIDVEN in the last 168 hours.  No results found for this or any previous visit (from the past 240 hour(s)).       Radiology Studies: No results found.      Scheduled Meds: . Chlorhexidine Gluconate Cloth  6 each Topical  Daily  . dexamethasone  2 mg Oral Q12H  . levETIRAcetam  500 mg Oral BID  . mometasone-formoterol  2 puff Inhalation BID  . pantoprazole  40 mg Oral Q supper  . senna  1 tablet Oral BID  . umeclidinium bromide  1 puff Inhalation Daily   Continuous Infusions:    LOS: 22 days    Time spent: 20 min with >50% on coc.       Nolberto Hanlon, MD [facility] P4/30/2022, 2:20 PMPatient ID: [name], male   DOB: 1955-11-30, 65 y.o.   MRN: [id number]

## 2020-09-16 DIAGNOSIS — Z923 Personal history of irradiation: Secondary | ICD-10-CM | POA: Diagnosis not present

## 2020-09-16 DIAGNOSIS — Z20822 Contact with and (suspected) exposure to covid-19: Secondary | ICD-10-CM | POA: Diagnosis present

## 2020-09-16 DIAGNOSIS — Z23 Encounter for immunization: Secondary | ICD-10-CM | POA: Diagnosis not present

## 2020-09-16 DIAGNOSIS — Z7982 Long term (current) use of aspirin: Secondary | ICD-10-CM | POA: Diagnosis not present

## 2020-09-16 DIAGNOSIS — I10 Essential (primary) hypertension: Secondary | ICD-10-CM | POA: Diagnosis present

## 2020-09-16 DIAGNOSIS — C3411 Malignant neoplasm of upper lobe, right bronchus or lung: Secondary | ICD-10-CM | POA: Diagnosis present

## 2020-09-16 DIAGNOSIS — R531 Weakness: Secondary | ICD-10-CM | POA: Diagnosis present

## 2020-09-16 DIAGNOSIS — R471 Dysarthria and anarthria: Secondary | ICD-10-CM | POA: Diagnosis present

## 2020-09-16 DIAGNOSIS — Z79899 Other long term (current) drug therapy: Secondary | ICD-10-CM | POA: Diagnosis not present

## 2020-09-16 DIAGNOSIS — C7931 Secondary malignant neoplasm of brain: Secondary | ICD-10-CM | POA: Diagnosis present

## 2020-09-16 DIAGNOSIS — G936 Cerebral edema: Secondary | ICD-10-CM | POA: Diagnosis present

## 2020-09-16 DIAGNOSIS — J439 Emphysema, unspecified: Secondary | ICD-10-CM | POA: Diagnosis present

## 2020-09-16 DIAGNOSIS — Z87891 Personal history of nicotine dependence: Secondary | ICD-10-CM | POA: Diagnosis not present

## 2020-09-16 DIAGNOSIS — G8191 Hemiplegia, unspecified affecting right dominant side: Secondary | ICD-10-CM | POA: Diagnosis present

## 2020-09-16 DIAGNOSIS — R4189 Other symptoms and signs involving cognitive functions and awareness: Secondary | ICD-10-CM | POA: Diagnosis present

## 2020-09-16 DIAGNOSIS — D509 Iron deficiency anemia, unspecified: Secondary | ICD-10-CM | POA: Diagnosis present

## 2020-09-16 DIAGNOSIS — Z9221 Personal history of antineoplastic chemotherapy: Secondary | ICD-10-CM | POA: Diagnosis not present

## 2020-09-16 NOTE — Progress Notes (Addendum)
PROGRESS NOTE  [name] [name]  NFA:[id number] DOB: [birth year] DOA: 08/22/2020 PCP: [name], NP   Chief Complain: Right arm/right leg numbness/weakness  Brief Narrative: Patient is a 65 year old male with history of COPD, non-small cell lung cancer status post chemoradiation, now on immunotherapy, active smoker who presented to the emergency department with further evaluation of right-sided weakness.  On presentation, he was hemodynamically stable.  Noncontrast head CT showed new 3.1 cm mass lesion involving the left posterior parietal convexity with large area of surrounding vasogenic edema and associated mass-effect.  CT with contrast confirmed 3.2 x 2.2 x 3 cm intra-axial mass lesion consistent with metastasis.  Started on Keppra, Decadron. Oncology,neurooncology and neurosurgery consulted,underwent stereotactic left frontoparietal cranitomy for resection of tumor on  4/14 PT initially  recommending inpatient rehab on discharge but now the plan is SNF/ home with home health.  Unsafe discharge because patient is uninsured, may not get home health and lives alone , does not get 24/7 support as recommended by PT. TOC following.  Medically stable for dc.  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  CT with contrast confirmed 3.2 x 2.2 x 3 cm intra-axial mass lesion consistent with metastasis.   Oncology,neurooncology and neurosurgery consulted. 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 Currently alert and  oriented but was confused on presentation. He has mild weakness on the right side We removed the staples on the head. 5/1 continue Decadron and Keppra    Non-small cell lung cancer: Follows with Dr. Julien Nordmann, status post chemoradiation, now on chemotherapy with Imfinzi.  5/1 needs to follow-up with Dr. Julien Nordmann after discharge          COPD: without exacerbation On room air  Continue inhalers     Right-sided weakness:PT initially  recommending inpatient rehab on discharge but now the plan is home with home health vs SNF .Unsafe discharge because patient is uninsured, may not get home health and lives alone , does not get 24/7 support as recommended by PT.  5/1 placement pending          DVT prophylaxis: SCD Code Status: Full Family Communication: None at bedside   Status is: Inpatient   Dispo: The patient is from: Home              Anticipated d/c is to: Unsure              Patient currently is medically stable for discharge   Difficult to place patient No     Consultants: Neurology  Procedures:None  Antimicrobials:  Anti-infectives (From admission, onward)   Start     Dose/Rate Route Frequency Ordered Stop   08/30/20 1800  ceFAZolin (ANCEF) IVPB 2g/100 mL premix        2 g 200 mL/hr over 30 Minutes Intravenous Every 8 hours 08/30/20 1643 08/31/20 0312   08/30/20 1315  ceFAZolin (ANCEF) IVPB 2g/100 mL premix  Status:  Discontinued        2 g 200 mL/hr over 30 Minutes Intravenous To Surgery 08/30/20 1249 08/30/20 1746  Subjective: Has no complaints  Objective: Vitals:   09/15/20 2042 09/15/20 2054 09/16/20 0522 09/16/20 0820  BP:  101/61 108/71 113/65  Pulse: 75 83 82 86  Resp: 18 18 15    Temp:  97.9 F (36.6 C) 98.4 F (36.9 C) 98.2 F (36.8 C)  TempSrc:  Oral Oral Oral  SpO2: 99% 98% 98% 96%  Weight:      Height:        Intake/Output Summary (Last 24 hours) at 09/16/2020 0932 Last data filed at 09/16/2020 0843 Gross per 24 hour  Intake  780 ml  Output 1100 ml  Net -320 ml   Filed Weights   08/23/20 0552  Weight: 56.3 kg    Examination: Sitting in chair watching TV, NAD CTA no wheeze rales rhonchi's Regular S1-S2 no gallops Soft benign positive bowel sounds No edema Alert oriented x3 Mood and affect appropriate  Data Reviewed: I have personally reviewed following labs and imaging studies  CBC: Recent Labs  Lab 09/12/20 0304  WBC 6.4  NEUTROABS 5.3  HGB 10.7*  HCT 33.9*  MCV 80.9  PLT 701   Basic Metabolic Panel: Recent Labs  Lab 09/12/20 0304  NA 137  K 4.4  CL 102  CO2 25  GLUCOSE 110*  BUN 13  CREATININE 0.79  CALCIUM 9.0   GFR: Estimated Creatinine Clearance: 69.9 mL/min (by C-G formula based on SCr of 0.79 mg/dL). Liver Function Tests: No results for input(s): AST, ALT, ALKPHOS, BILITOT, PROT, ALBUMIN in the last 168 hours. No results for input(s): LIPASE, AMYLASE in the last 168 hours. No results for input(s): AMMONIA in the last 168 hours. Coagulation Profile: No results for input(s): INR, PROTIME in the last 168 hours. Cardiac Enzymes: No results for input(s): CKTOTAL, CKMB, CKMBINDEX, TROPONINI in the last 168 hours. BNP (last 3 results) No results for input(s): PROBNP in the last 8760 hours. HbA1C: No results for input(s): HGBA1C in the last 72 hours. CBG: No results for input(s): GLUCAP in the last 168 hours. Lipid Profile: No results for input(s): CHOL, HDL, LDLCALC, TRIG, CHOLHDL, LDLDIRECT in the last 72 hours. Thyroid Function Tests: No results for input(s): TSH, T4TOTAL, FREET4, T3FREE, THYROIDAB in the last 72 hours. Anemia Panel: No results for input(s): VITAMINB12, FOLATE, FERRITIN, TIBC, IRON, RETICCTPCT in the last 72 hours. Sepsis Labs: No results for input(s): PROCALCITON, LATICACIDVEN in the last 168 hours.  No results found for this or any previous visit (from the past 240 hour(s)).       Radiology Studies: No results found.      Scheduled  Meds: . Chlorhexidine Gluconate Cloth  6 each Topical Daily  . dexamethasone  2 mg Oral Q12H  . levETIRAcetam  500 mg Oral BID  . mometasone-formoterol  2 puff Inhalation BID  . pantoprazole  40 mg Oral Q supper  . senna  1 tablet Oral BID  . umeclidinium bromide  1 puff Inhalation Daily   Continuous Infusions:    LOS: 23 days    Time spent: 20 min with >50% on coc.       Nolberto Hanlon, MD [facility] P5/05/2020, 9:32 AM

## 2020-09-17 DIAGNOSIS — C7931 Secondary malignant neoplasm of brain: Secondary | ICD-10-CM | POA: Diagnosis not present

## 2020-09-17 MED ORDER — COVID-19 MRNA VACC (MODERNA) 50 MCG/0.25ML IM SUSP
0.2500 mL | Freq: Once | INTRAMUSCULAR | Status: AC
  Administered 2020-09-17: 0.25 mL via INTRAMUSCULAR
  Filled 2020-09-17: qty 0.25

## 2020-09-17 NOTE — Plan of Care (Signed)

## 2020-09-17 NOTE — Progress Notes (Signed)
[facility] PROGRESS NOTE  [name] [name] ATF:[id number] DOB: 25-May-1955 DOA: 08/22/2020 PCP: [name], NP  Status: Remains inpatient appropriate because:Altered mental status and Unsafe d/c plan   Dispo:  Patient From:  Home  Planned Disposition: [facility] short-term rehab  Medically stable for discharge:  Yes             Difficult to place:      Level of care: Med-Surg  Code Status: Full Family Communication: Patient DVT prophylaxis: SCDs Vaccination status: Moderna COVID-vaccine 2/4 and 3/4.  Will order booster vaccine today 5/12   HPI:  65 year old male with history of COPD, non-small cell lung cancer status post chemoradiation, now on immunotherapy, active smoker who presented to the emergency department with further evaluation of right-sided weakness.  On presentation, he was hemodynamically stable.  Noncontrast head CT showed new 3.1 cm mass lesion involving the left posterior parietal convexity with large area of surrounding vasogenic edema and associated mass-effect.  CT with contrast confirmed 3.2 x 2.2 x 3 cm intra-axial mass lesion consistent with metastasis.  Started on Keppra, Decadron.   Oncology,neurooncology and neurosurgery consulted,underwent stereotactic left frontoparietal cranitomy for resection of tumor on  4/14 PT initially  recommending inpatient rehab on discharge but now the plan is SNF/ home with home health.  Unsafe discharge because patient is uninsured, may not get home health and lives alone , does not get 24/7 support as recommended by PT.    Subjective: Present awake and pleasant.  Interactive during conversation but does have some difficulty with word finding and short-term memory deficits.  Objective: Vitals:   09/17/20 0832 09/17/20 1301  BP:  102/65  Pulse:  88  Resp:  16  Temp:  (!) 97.3 F (36.3 C)  SpO2: 98% 97%    Intake/Output Summary (Last 24 hours) at 09/17/2020 1622 Last data filed at 09/17/2020  0800 Gross per 24 hour  Intake 315 ml  Output 2625 ml  Net -2310 ml   Filed Weights   08/23/20 0552  Weight: 56.3 kg    Exam: Constitutional: NAD, calm, comfortable Respiratory: clear to auscultation bilaterally, no wheezing, no crackles. Normal respiratory effort.  Room air Cardiovascular: Regular rate and rhythm, no murmurs / rubs / gallops. No extremity edema.  Skin warm, dry and pink Abdomen: no tenderness, no masses palpated.  Bowel sounds positive. LBM 4/28 Neurologic: CN 2-12 grossly intact. Sensation intact, DTR normal. Strength 5/5 x all 4 extremities low appears to have some subtle right-sided weakness Psychiatric: Alert and for the most part oriented x3.  Does have short-term memory deficits.  Pleasant affect.  Assessment/Plan: Acute problems: Brain mass secondary to metastasis Confirmed on CT scan  Oncology,neurooncology and neurosurgery consulted. Subsequently underwent stereotactic left frontoparietal cranitomy for resection of tumor 4/14 with biopsy consistent with metastatic adenocarcinoma of  lung  Continue Decadron and Keppra as recommended by neurosurgery  Non-small cell lung cancer:  Follows with Dr. Julien Nordmann, status post chemoradiation, previously on chemotherapy with Imfinzi.  5/2 discussed with Dr. Earlie Server who states before initiation of any immunotherapy/chemotherapy that patient will need to follow-up in the office noting that previous treatment likely to be changed.  Physical deconditioning In the context of acute right-sided weakness from brain mass PT/OT recommend SNF 24/7 care    Other problems: COPD:  Without exacerbation  stable on room air Continue bronchodilators.     Data Reviewed: Basic Metabolic Panel: Recent Labs  Lab 09/12/20 0304  NA 137  K 4.4  CL  102  CO2 25  GLUCOSE 110*  BUN 13  CREATININE 0.79  CALCIUM 9.0   Liver Function Tests: No results for input(s): AST, ALT, ALKPHOS, BILITOT, PROT, ALBUMIN in the last 168  hours. No results for input(s): LIPASE, AMYLASE in the last 168 hours. No results for input(s): AMMONIA in the last 168 hours. CBC: Recent Labs  Lab 09/12/20 0304  WBC 6.4  NEUTROABS 5.3  HGB 10.7*  HCT 33.9*  MCV 80.9  PLT 272    Studies: No results found.  Scheduled Meds: . Chlorhexidine Gluconate Cloth  6 each Topical Daily  . dexamethasone  2 mg Oral Q12H  . levETIRAcetam  500 mg Oral BID  . mometasone-formoterol  2 puff Inhalation BID  . pantoprazole  40 mg Oral Q supper  . senna  1 tablet Oral BID  . 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

## 2020-09-17 NOTE — Plan of Care (Signed)
?  Problem: Clinical Measurements: ?Goal: Ability to maintain clinical measurements within normal limits will improve ?Outcome: Progressing ?Goal: Will remain free from infection ?Outcome: Progressing ?Goal: Diagnostic test results will improve ?Outcome: Progressing ?  ?

## 2020-09-17 NOTE — TOC Progression Note (Addendum)
Transition of Care [facility]) - Progression Note    Patient Details  Name: [name] [name] MRN: [id number] Date of Birth: August 11, 1955  [facility] CM/SW Contact  Curlene Labrum, RN Phone Number: 09/17/2020, 12:20 PM  Clinical Narrative:    Case management spoke with the patient at the bedside in regards to transitions of care to SNF facility.  The patient lives alone and does not have 24 hour supervision available at this time.  The patient has church family members that check on him at the home and assist with transportation to appointments and groceries at this time.  PT/OT are recommending SNf placement at this time - S/P frontoparietal craniotomy per neurosurgery on 08/30/2020.  I spoke with Erin Hearing, NP and the patient is being followed by Dr. Julien Nordmann, oncology at this time and the physician states that the patient will be scheduled to follow up at the oncology office in a few weeks after discharge from the hospital to discuss a treatment plan at this time.  The patient has been fully vaccinated for COVID and is uncertain when he received his last booster that was administered at a [employer].  I spoke with Loma Boston, CM at [facility], and they are currently reviewing the patient for possible admission at their facility - no bed offer at this time.  Cm left a message with Gayla Medicus, CM with [facility] to review clinicals for [facility] facilities.  The patient will need to be placed locally so that patient can follow up with oncology in the next few weeks.  CM and MSW will continue to follow the patient for SNF placement.   Expected Discharge Plan: Skilled Nursing Facility Barriers to Discharge: Family Issues,Inadequate or no insurance,Continued Medical Work up,SNF Pending payor source - LOG  Expected Discharge Plan and Services Expected Discharge Plan: [place]   Discharge Planning Services: CM Consult Post Acute  Care Choice: [place] Living arrangements for the past 2 months: Apartment                                       Social Determinants of Health (SDOH) Interventions    Readmission Risk Interventions No flowsheet data found.

## 2020-09-17 NOTE — Progress Notes (Addendum)
Physical Therapy Treatment Patient Details Name: [name] [name] MRN: [id number] DOB: Jul 09, 1955 Today's Date: 09/17/2020    History of Present Illness 65 y.o. male who presented 08/22/20 with R-sided weakness and numbness. Noncontrast head CT concerning for new 3.1 cm mass lesion involving the left posterior parietal convexity with large area of surrounding vasogenic edema and associated mass-effect. This is consistent with metastatic disease. S/p stereotactic L frontoparietal craniotomy for resection of tumor 4/14. PMH: HTN, anemia, bullous emphysema, COPD, and non-small cell lung cancer status post chemoradiation and now on immunotherapy.    PT Comments    Pt seated in recliner.  Focused on higher level balance challenges during gt and when back in room on soft matt.  Pt presents with multiple LOB during challenges but able to use righting response to correct 60% of the time and requiring physical assistance the other 40% to correct balance.  Continue to recommend snf.    Follow Up Recommendations  SNF     Equipment Recommendations  None recommended by PT    Recommendations for Other Services       Precautions / Restrictions Precautions Precautions: Fall Precaution Comments: R sided weakness Restrictions Weight Bearing Restrictions: No    Mobility  Bed Mobility               General bed mobility comments: in chair on arrival    Transfers Overall transfer level: Modified independent                  Ambulation/Gait Ambulation/Gait assistance: Supervision;Min assist Gait Distance (Feet): 200 Feet (x3 trials.) Assistive device: None Gait Pattern/deviations: Step-through pattern;Narrow base of support;Decreased weight shift to right Gait velocity: reduced   General Gait Details: Pt with decreased weight shifting to R side.  Cued to march to overexaggerate movement to promote weight shifting.   Stairs             Wheelchair Mobility    Modified  [place] (Stroke Patients Only)       Balance Overall balance assessment: Needs assistance Sitting-balance support: Feet supported Sitting balance-Leahy Scale: Good   Postural control: Posterior lean;Right lateral lean Standing balance support: No upper extremity supported Standing balance-Leahy Scale: Fair Standing balance comment: Performed standing with eyes open and closed on soft 4 inch matt with intermittent B UE support.  Pt able to hold for 10 sec at a time with out HHA.  Performed SLS on matt x 1 sec on R and x 4 sec on L.             High level balance activites: Side stepping;Backward walking (high steps marching.) High Level Balance Comments: LOB noted with retrogt.  Pt with poor ability to adapt and required frequent bouts of righting to correct balance.            Cognition Arousal/Alertness: Awake/alert Behavior During Therapy: WFL for tasks assessed/performed Overall Cognitive Status: Impaired/Different from baseline Area of Impairment: Problem solving                             Problem Solving: Slow processing General Comments: Pt able to recall room number and find room with out cueing      Exercises      General Comments        Pertinent Vitals/Pain Pain Assessment: No/denies pain    Home Living  Prior Function            PT Goals (current goals can now be found in the care plan section) Acute Rehab PT Goals Patient Stated Goal: go home Progress towards PT goals: Progressing toward goals    Frequency    Min 3X/week      PT Plan Current plan remains appropriate;Frequency needs to be updated    Co-evaluation              AM-PAC PT "6 Clicks" Mobility   Outcome Measure  Help needed turning from your back to your side while in a flat bed without using bedrails?: None Help needed moving from lying on your back to sitting on the side of a flat bed without using bedrails?: None Help  needed moving to and from a bed to a chair (including a wheelchair)?: None Help needed standing up from a chair using your arms (e.g., wheelchair or bedside chair)?: None Help needed to walk in hospital room?: A Little Help needed climbing 3-5 steps with a railing? : A Little 6 Click Score: 22    End of Session Equipment Utilized During Treatment: Gait belt Activity Tolerance: Patient tolerated treatment well Patient left: in chair;with call bell/phone within reach;with chair alarm set Nurse Communication: Mobility status PT Visit Diagnosis: Unsteadiness on feet (R26.81);Other abnormalities of gait and mobility (R26.89);Muscle weakness (generalized) (M62.81);Difficulty in walking, not elsewhere classified (R26.2);Other symptoms and signs involving the nervous system (R29.898);Hemiplegia and hemiparesis Hemiplegia - Right/Left: Right Hemiplegia - dominant/non-dominant: Non-dominant Hemiplegia - caused by: Cerebral infarction     Time: 1217-1237 PT Time Calculation (min) (ACUTE ONLY): 20 min  Charges:  $Gait Training: 8-22 mins                     Erasmo Leventhal , PTA Acute Rehabilitation Services Pager [phone] Office [phone]     Zahli Vetsch Eli Hose 09/17/2020, 2:27 PM

## 2020-09-18 DIAGNOSIS — C7931 Secondary malignant neoplasm of brain: Secondary | ICD-10-CM | POA: Diagnosis not present

## 2020-09-18 NOTE — TOC Progression Note (Addendum)
Transition of Care [facility]) - Progression Note    Patient Details  Name: [name] [name] MRN: [id number] Date of Birth: 27-Jan-1956  [facility]) CM/SW Contact  Curlene Labrum, RN Phone Number: 09/18/2020, 11:16 AM  Clinical Narrative:    Case management met with the patient at the bedside to discuss transitions of care to SNF.  The patient is working with Pt/OT and continues to need SNF placement at this time.  The patient lives alone and does not have 24 hour supervision and support.  CM asked [facility] to review the patient's clinicals for possible admission to the facility.  CM and MSW will continue to follow the patient for [facility] admission - no bed offers at this time.  09/18/2020 1134 - Secure email sent to Olga Coaster, [facility] supervisor and asked that the patient be placed on the PT initiatives list to help progress the patient physically considering patient does not have current bed offers at this time for [facility] placement.  DTP Team will continue to work towards SNF placement for the patient.   Expected Discharge Plan: Skilled Nursing Facility Barriers to Discharge: Family Issues,Inadequate or no insurance,Continued Medical Work up,SNF Pending payor source - LOG  Expected Discharge Plan and Services Expected Discharge Plan: [place]   Discharge Planning Services: CM Consult Post Acute Care Choice: [place] Living arrangements for the past 2 months: Apartment                                       Social Determinants of Health (SDOH) Interventions    Readmission Risk Interventions No flowsheet data found.

## 2020-09-18 NOTE — Progress Notes (Signed)
[facility] PROGRESS NOTE  Fremont Skalicky BDZ:[id number] DOB: 07/11/1955 DOA: 08/22/2020 PCP: [name], NP  Status: Remains inpatient appropriate because:Altered mental status and Unsafe d/c plan   Dispo:  Patient From:  Home  Planned Disposition: [facility] short-term rehab  Medically stable for discharge:  Yes             Difficult to place:      Level of care: Med-Surg  Code Status: Full Family Communication: Patient DVT prophylaxis: SCDs Vaccination status: Moderna COVID-vaccine 2/4 and 3/4.  Will order booster vaccine today 5/37   HPI:  65 year old male with history of COPD, non-small cell lung cancer status post chemoradiation, now on immunotherapy, active smoker who presented to the emergency department with further evaluation of right-sided weakness.  On presentation, he was hemodynamically stable.  Noncontrast head CT showed new 3.1 cm mass lesion involving the left posterior parietal convexity with large area of surrounding vasogenic edema and associated mass-effect.  CT with contrast confirmed 3.2 x 2.2 x 3 cm intra-axial mass lesion consistent with metastasis.  Started on Keppra, Decadron.   Oncology,neurooncology and neurosurgery consulted,underwent stereotactic left frontoparietal cranitomy for resection of tumor on  4/14 PT initially  recommending inpatient rehab on discharge but now the plan is SNF/ home with home health.  Unsafe discharge because patient is uninsured, may not get home health and lives alone , does not get 24/7 support as recommended by PT.    Subjective: Alert and sitting up in chair.  No complaints.  Continues to have some issues with dysarthria.  Objective: Vitals:   09/18/20 0831 09/18/20 0832  BP:    Pulse:    Resp:    Temp:    SpO2: 97% 97%    Intake/Output Summary (Last 24 hours) at 09/18/2020 0847 Last data filed at 09/18/2020 0736 Gross per 24 hour  Intake --  Output 1600 ml  Net -1600 ml   Filed  Weights   08/23/20 0552  Weight: 56.3 kg    Exam: Constitutional: Alert, calm, no acute distress Respiratory: Clear lungs, stable on room air Cardiovascular: Normal heart sounds, no peripheral edema, regular pulse Abdomen:. LBM 4/28, normoactive bowel sounds.  Tolerating diet Neurologic: CN 2-12 grossly intact. Sensation intact, DTR normal. Strength 5/5 x all 4 extremities low appears to have some subtle right-sided weakness Psychiatric: Alert and oriented x3.  Pleasant affect.  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   5/2 discussed with Dr. Earlie Server who stated before initiation of any immunotherapy/chemotherapy that patient will need to follow-up in the office noting that previous treatment likely to be changed.  Physical deconditioning PT/OT recommend SNF 24/7 care    Other problems: COPD:  Without exacerbation  stable on room air Continue bronchodilators.     Data Reviewed: Basic Metabolic Panel: Recent Labs  Lab 09/12/20 0304  NA 137  K 4.4  CL 102  CO2 25  GLUCOSE 110*  BUN 13  CREATININE 0.79  CALCIUM 9.0   Liver Function Tests: No results for input(s): AST, ALT, ALKPHOS, BILITOT, PROT, ALBUMIN in the last 168 hours. No results for input(s): LIPASE, AMYLASE in the last 168 hours. No results for input(s): AMMONIA in the last 168 hours. CBC: Recent Labs  Lab 09/12/20 0304  WBC 6.4  NEUTROABS 5.3  HGB 10.7*  HCT 33.9*  MCV 80.9  PLT 272    Studies: No results found.  Scheduled Meds: . Chlorhexidine Gluconate Cloth  6 each Topical Daily  . dexamethasone  2 mg Oral Q12H  . levETIRAcetam  500 mg Oral BID  . mometasone-formoterol  2 puff Inhalation BID  .  pantoprazole  40 mg Oral Q supper  . senna  1 tablet Oral BID  . 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

## 2020-09-18 NOTE — Plan of Care (Signed)

## 2020-09-18 NOTE — Progress Notes (Signed)
DTP CSW and RN CM spoke with patient's church friend [name] for further discussion regarding discharge planning. Peggy reports she and fellow church members assist the patient obtaining groceries and medications, but that he utilizes [employer] for oncology appointments. Peggy confirmed submission of Medicaid and disability applications to DSS and [facility]. Peggy states patient is able to complete majority of his ADL's. Peggy reports patient is optimistic about regaining the strength in his leg to return to full independence. Peggy reports the patient's income comes from early retirement (~$961 per month) which he uses for his rent, electricity, etc.  Madilyn Fireman, MSW, LCSW Transitions of Care  Clinical Social Worker II [phone]

## 2020-09-18 NOTE — Progress Notes (Signed)
Occupational Therapy Treatment Patient Details Name: [name] [name] MRN: [id number] DOB: 08/28/1955 Today's Date: 09/18/2020    History of present illness 64 y.o. male who presented 08/22/20 with R-sided weakness and numbness. Noncontrast head CT concerning for new 3.1 cm mass lesion involving the left posterior parietal convexity with large area of surrounding vasogenic edema and associated mass-effect. This is consistent with metastatic disease. S/p stereotactic L frontoparietal craniotomy for resection of tumor 4/14. PMH: HTN, anemia, bullous emphysema, COPD, and non-small cell lung cancer status post chemoradiation and now on immunotherapy.   OT comments  Pt met some ADL goals, care plan updated and new goals added. Pt was given cognitive ADL task to bathe at the sink as he normally would at home. Pt completed all ambulation and ADL tasks at a mod I level, with only one vc to grab a bath cloth. Pt had great safety awareness during tasks, did not have LOB or R knee buckle, and completed sit<>stand transfers appropriately.  Pt continues to benefit from acute OT services to focus on cognitive components of ADL/IADLs to promote safe transition into the next environment. D/c plan remains appropriate.    Follow Up Recommendations  SNF;Supervision/Assistance - 24 hour    Equipment Recommendations  3 in 1 bedside commode       Precautions / Restrictions Precautions Precautions: Fall Precaution Comments: R sided weakness Restrictions Weight Bearing Restrictions: No       Mobility Bed Mobility Overal bed mobility: Modified Independent             General bed mobility comments: in chair on arrival    Transfers Overall transfer level: Modified independent Equipment used: None Transfers: Sit to/from Stand           General transfer comment: Mod I, no LOB or knee buckling this session    Balance Overall balance assessment: Needs assistance Sitting-balance support: Feet  unsupported;No upper extremity supported (feet cross and not touching floor, arm rests not used) Sitting balance-Leahy Scale: Good     Standing balance support: No upper extremity supported Standing balance-Leahy Scale: Good            ADL either performed or assessed with clinical judgement   ADL Overall ADL's : Needs assistance/impaired Eating/Feeding: Supervision/ safety   Grooming: Wash/dry hands;Wash/dry face;Oral care;Applying deodorant;Modified independent;Standing (at sink)   Upper Body Bathing: Modified independent;Standing (at sink)   Lower Body Bathing: Modified independent;Sit to/from stand (sitting and standing at the sink)   Upper Body Dressing : Modified independent;Sitting   Lower Body Dressing: Modified independent;Sit to/from stand               Functional mobility during ADLs: Modified independent General ADL Comments: Pt completed ADL task at mod I level with no safety concerns, pt completed most tasks while standing at the sink, and sat fro lower body dressing tasks.               Cognition Arousal/Alertness: Awake/alert Behavior During Therapy: WFL for tasks assessed/performed Overall Cognitive Status: Impaired/Different from baseline Area of Impairment: Problem solving            Following Commands: Follows multi-step commands with increased time   Awareness: Intellectual;Emergent Problem Solving: Slow processing General Comments: pt demonstrated good ability to follow inital command of "bathe at the sink, get everything you need as you usually would at home." Pt only requried one verbal cue to obtain wash cloths.                General Comments pt reported the he continues to feel as if his "memory" and "talking" isnt as good as it was, pt with increased time to proccess information throughout session.    Pertinent Vitals/ Pain       Pain Assessment: No/denies pain Pain Intervention(s): Monitored during session          Frequency  Min 2X/week        Progress Toward Goals  OT Goals(current goals can now be found in the care plan section)  Progress towards OT goals: Goals met and updated - see care plan  Acute Rehab OT Goals Patient Stated Goal: go home OT Goal Formulation: With patient Time For Goal Achievement: 10/01/20 Potential to Achieve Goals: Good ADL Goals Pt Will Transfer to Toilet: with modified independence;ambulating Pt Will Perform Tub/Shower Transfer: with modified independence;ambulating;tub bench Additional ADL Goal #1: Pt will complete 3 step trail making task in moderately distracting environment with minimal redirectional cues Additional ADL Goal #2: Pt will complete HEP with RUE with min vc to increase functional use RUE Additional ADL Goal #3: Pt will complete all bathing and dressing ADLs with mod I to promote safe transition into home environment  Plan Discharge plan remains appropriate       AM-PAC OT "6 Clicks" Daily Activity     Outcome Measure   Help from another person eating meals?: None Help from another person taking care of personal grooming?: None Help from another person toileting, which includes using toliet, bedpan, or urinal?: A Little Help from another person bathing (including washing, rinsing, drying)?: A Little Help from another person to put on and taking off regular upper body clothing?: None Help from another person to put on and taking off regular lower body clothing?: None 6 Click Score: 22    End of Session    OT Visit Diagnosis: Unsteadiness on feet (R26.81);Other abnormalities of gait and mobility (R26.89);Muscle weakness (generalized) (M62.81);Apraxia (R48.2);Other symptoms and signs involving cognitive function;Hemiplegia and hemiparesis Hemiplegia - Right/Left: Right Hemiplegia - dominant/non-dominant: Non-Dominant Hemiplegia - caused by: Unspecified   Activity Tolerance Patient tolerated treatment well   Patient Left in chair;with  call bell/phone within reach;with chair alarm set   Nurse Communication Mobility status        Time: 1455-1515 OT Time Calculation (min): 20 min  Charges: OT General Charges $OT Visit: 1 Visit OT Treatments $Self Care/Home Management : 8-22 mins     Winefred Hillesheim A Kalianna Verbeke 09/18/2020, 3:39 PM

## 2020-09-18 NOTE — Plan of Care (Signed)
  Problem: Clinical Measurements: Goal: Will remain free from infection Outcome: Progressing Goal: Diagnostic test results will improve Outcome: Progressing Goal: Cardiovascular complication will be avoided Outcome: Progressing   

## 2020-09-18 NOTE — Progress Notes (Signed)
  Speech Language Pathology Treatment: Cognitive-Linquistic  Patient Details Name: [name] [name] MRN: [id number] DOB: 01-15-1956 Today's Date: 09/18/2020 Time: 5284-1324 SLP Time Calculation (min) (ACUTE ONLY): 18 min  Assessment / Plan / Recommendation Clinical Impression  Pt's communication and cognition has significantly improved compared to previous session. His dysfluency and articulatory are longstanding per pt. He attended to this therapist and was an effective communicative partner throughout session. Multiple examples of demonstrating functional problem solved functional problems during session including (determining date/month, determine what day admitted, calculated length of hospital stay). Intellectually and anticipatory awareness exhibited. He recalled course and plan of treatment for lung cancer, upcoming appointment and disposition plan to rehab center. Memory strategies discussed - he is illiterate for the most part per his report therefore memory strategies are more difficulty. Therapist suggested techniques not involving reading or writing he could utilize. He states a church memory assists with his appointments and uses a pill box for meds. He is functional I acute setting and no further ST needed on acute. Recommend at next venue of care.     HPI HPI: 65 y.o. male who presented 08/22/20 with R-sided weakness and numbness. Noncontrast head CT concerning for new 3.1 cm mass lesion involving the left posterior parietal convexity with large area of surrounding vasogenic edema and associated mass-effect. This is consistent with metastatic disease. S/p stereotactic L frontoparietal craniotomy for resection of tumor 4/14. PMH: HTN, anemia, bullous emphysema, COPD, and non-small cell lung cancer status post chemoradiation and now on immunotherapy.      SLP Plan  All goals met;Discharge SLP treatment due to (comment)       Recommendations                   Oral Care  Recommendations: Oral care BID Follow up Recommendations: Skilled Nursing facility SLP Visit Diagnosis: Cognitive communication deficit (M01.027) Plan: All goals met;Discharge SLP treatment due to (comment)                      Houston Siren 09/18/2020, 2:04 PM Orbie Pyo Colvin Caroli.Ed Risk analyst [phone] Office [phone]

## 2020-09-19 DIAGNOSIS — C7931 Secondary malignant neoplasm of brain: Secondary | ICD-10-CM | POA: Diagnosis not present

## 2020-09-19 NOTE — Progress Notes (Signed)
[facility] PROGRESS NOTE  Marquiz Sotelo IFO:[id number] DOB: 01-04-1956 DOA: 08/22/2020 PCP: [name], NP  Status: Remains inpatient appropriate because:Altered mental status and Unsafe d/c plan   Dispo:  Patient From:  Home  Planned Disposition: Home w/ [facility] HH for short-term rehab  Medically stable for discharge:  Yes             Difficult to place:      Level of care: Med-Surg  Code Status: Full Family Communication: Patient DVT prophylaxis: SCDs Vaccination status: Moderna COVID-vaccine 2/4 and 3/4.  Will order booster vaccine today 5/3   HPI:  65 year old male with history of COPD, non-small cell lung cancer status post chemoradiation, now on immunotherapy, active smoker who presented to the emergency department with further evaluation of right-sided weakness.  On presentation, he was hemodynamically stable.  Noncontrast head CT showed new 3.1 cm mass lesion involving the left posterior parietal convexity with large area of surrounding vasogenic edema and associated mass-effect.  CT with contrast confirmed 3.2 x 2.2 x 3 cm intra-axial mass lesion consistent with metastasis.  Started on Keppra, Decadron.   Oncology,neurooncology and neurosurgery consulted,underwent stereotactic left frontoparietal cranitomy for resection of tumor on  4/14 PT initially  recommending inpatient rehab on discharge but now the plan is SNF/ home with home health.   Patient does not have any funds to pay for skilled nursing facility.  We will be able to arrange for charity home health.  Patient has extensive assistance from church family who will be able to help him after discharge.  We have increased frequency of patient's PT and OT treatments and are hopeful that possibly by the end of the week he will be improved enough to discharge to home with home health.   Subjective: Awake and calm.  States did not walk yesterday.  Objective: Vitals:   09/18/20 2119 09/19/20 0546  BP:  97/60 128/75  Pulse: 93 81  Resp: 16 17  Temp: 98.6 F (37 C) 97.8 F (36.6 C)  SpO2: 97% 99%    Intake/Output Summary (Last 24 hours) at 09/19/2020 0827 Last data filed at 09/19/2020 0548 Gross per 24 hour  Intake 1080 ml  Output 840 ml  Net 240 ml   Filed Weights   08/23/20 0552  Weight: 56.3 kg    Exam: Constitutional: Calm, cooperative, no acute distress Respiratory: Posterior lung sounds clear, room air. Cardiovascular: S1-S2, regular pulse, no peripheral edema Abdomen:. LBM 5/3, soft, normoactive bowel sounds.  Eating well Neurologic: CN 2-12 grossly intact. Sensation intact, DTR normal. Strength 5/5 x all 4 extremities except for very mild right-sided weakness Psychiatric: Alert and oriented.  Continues to have some issues with midsentence dysarthria.  Pleasant affect.  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   5/2 discussed with Dr. Earlie Server who stated before initiation of any immunotherapy/chemotherapy that patient will need to follow-up in the office noting that previous treatment likely to be changed.  Physical deconditioning PT/OT recommend SNF 24/7 care    Other problems: COPD:  Without exacerbation  stable on room air Continue bronchodilators.     Data Reviewed: Basic Metabolic Panel: No results for input(s): NA, K, CL, CO2, GLUCOSE, BUN, CREATININE, CALCIUM, MG, PHOS in the last 168 hours. Liver Function Tests: No  results for input(s): AST, ALT, ALKPHOS, BILITOT, PROT, ALBUMIN in the last 168 hours. No results for input(s): LIPASE, AMYLASE in the last 168 hours. No results for input(s): AMMONIA in the last 168 hours. CBC: No results for input(s): WBC,  NEUTROABS, HGB, HCT, MCV, PLT in the last 168 hours.  Studies: No results found.  Scheduled Meds: . Chlorhexidine Gluconate Cloth  6 each Topical Daily  . dexamethasone  2 mg Oral Q12H  . levETIRAcetam  500 mg Oral BID  . mometasone-formoterol  2 puff Inhalation BID  . pantoprazole  40 mg Oral Q supper  . senna  1 tablet Oral BID  . 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

## 2020-09-19 NOTE — Progress Notes (Signed)
Physical Therapy Treatment Patient Details Name: [name] [name] MRN: [id number] DOB: 11/15/1955 Today's Date: 09/19/2020    History of Present Illness 65 y.o. male who presented 08/22/20 with R-sided weakness and numbness. Noncontrast head CT concerning for new 3.1 cm mass lesion involving the left posterior parietal convexity with large area of surrounding vasogenic edema and associated mass-effect. This is consistent with metastatic disease. S/p stereotactic L frontoparietal craniotomy for resection of tumor 4/14. PMH: HTN, anemia, bullous emphysema, COPD, and non-small cell lung cancer status post chemoradiation and now on immunotherapy.    PT Comments    Pt with continued focus on balance and strengthening R LE.  He remains weak on R side but balance is improving.  He continues to benefit from skilled rehab during acute stay.     Follow Up Recommendations  SNF     Equipment Recommendations  None recommended by PT    Recommendations for Other Services Rehab consult     Precautions / Restrictions Precautions Precautions: Fall Precaution Comments: R sided weakness    Mobility  Bed Mobility               General bed mobility comments: in chair on arrival    Transfers Overall transfer level: Modified independent Equipment used: None                Ambulation/Gait Ambulation/Gait assistance: Min assist Gait Distance (Feet): 200 Feet Assistive device: None Gait Pattern/deviations: Step-through pattern;Narrow base of support;Decreased weight shift to right     General Gait Details: Min assistance to shift weight to R side during L swing phase.  No overt LOB but continues to require assistance to correct gt deviations. Intermittent bouts of supervision but lack ability to weight shift R without assistance.   Stairs             Wheelchair Mobility    Modified Rankin (Stroke Patients Only)       Balance Overall balance assessment: Needs assistance    Sitting balance-Leahy Scale: Good       Standing balance-Leahy Scale: Fair Standing balance comment: Performed standing balance activities on level and unlevel surfaces.  Unlevel surfaces performed R and L SLS with unilateral UE support. x 15 sec each side with eyes closes.  Performed double leg stance with eyes closed and no UE support on unlevel surface x 15 sec.  Righting response intact.  Pt performed on level surface SLS with no UE support.  x 5 sec on L and x 4 sec on R side with eyes open. Single Leg Stance - Right Leg: 4 Single Leg Stance - Left Leg: 5       Rhomberg - Eyes Closed: 15 (unlevel surface.)                Cognition Arousal/Alertness: Awake/alert Behavior During Therapy: WFL for tasks assessed/performed Overall Cognitive Status: Impaired/Different from baseline Area of Impairment: Problem solving;Following commands                       Following Commands: Follows multi-step commands with increased time     Problem Solving: Slow processing        Exercises General Exercises - Lower Extremity Hip Flexion/Marching: AROM;Both;10 reps;Standing Heel Raises: AROM;Both;10 reps;Standing Mini-Sqauts: AROM;Both;10 reps;Standing Other Exercises Other Exercises: Repeated sts with no UE support x 10 reps, increased time and effort noted.    General Comments        Pertinent Vitals/Pain Pain Assessment: No/denies pain  Home Living                      Prior Function            PT Goals (current goals can now be found in the care plan section) Acute Rehab PT Goals Patient Stated Goal: go home Potential to Achieve Goals: Good Additional Goals Additional Goal #1: Pt will score >19 on DGI to indicate lower fall risk Progress towards PT goals: Progressing toward goals    Frequency    Min 3X/week      PT Plan Current plan remains appropriate    Co-evaluation              AM-PAC PT "6 Clicks" Mobility   Outcome  Measure  Help needed turning from your back to your side while in a flat bed without using bedrails?: None Help needed moving from lying on your back to sitting on the side of a flat bed without using bedrails?: None Help needed moving to and from a bed to a chair (including a wheelchair)?: None Help needed standing up from a chair using your arms (e.g., wheelchair or bedside chair)?: None Help needed to walk in hospital room?: A Little Help needed climbing 3-5 steps with a railing? : A Little 6 Click Score: 22    End of Session Equipment Utilized During Treatment: Gait belt Activity Tolerance: Patient tolerated treatment well Patient left: in chair;with call bell/phone within reach;with chair alarm set Nurse Communication: Mobility status PT Visit Diagnosis: Unsteadiness on feet (R26.81);Other abnormalities of gait and mobility (R26.89);Muscle weakness (generalized) (M62.81);Difficulty in walking, not elsewhere classified (R26.2);Other symptoms and signs involving the nervous system (R29.898);Hemiplegia and hemiparesis Hemiplegia - Right/Left: Right Hemiplegia - dominant/non-dominant: Non-dominant Hemiplegia - caused by: Cerebral infarction     Time: 5449-2010 PT Time Calculation (min) (ACUTE ONLY): 17 min  Charges:  $Gait Training: 8-22 mins                     Erasmo Leventhal , PTA Acute Rehabilitation Services Pager [phone] Office [place] 09/19/2020, 1:41 PM

## 2020-09-19 NOTE — Plan of Care (Signed)
  Problem: Clinical Measurements: Goal: Will remain free from infection Outcome: Progressing Goal: Cardiovascular complication will be avoided Outcome: Progressing

## 2020-09-20 ENCOUNTER — Other Ambulatory Visit: Payer: Self-pay

## 2020-09-20 DIAGNOSIS — C7931 Secondary malignant neoplasm of brain: Secondary | ICD-10-CM | POA: Diagnosis not present

## 2020-09-20 MED ORDER — SENNA 8.6 MG PO TABS
1.0000 | ORAL_TABLET | Freq: Two times a day (BID) | ORAL | 0 refills | Status: AC
  Filled 2020-09-20 – 2021-08-05 (×3): qty 120, 60d supply, fill #0

## 2020-09-20 MED ORDER — LEVETIRACETAM 500 MG PO TABS
500.0000 mg | ORAL_TABLET | Freq: Two times a day (BID) | ORAL | 2 refills | Status: DC
  Filled 2020-09-20: qty 60, 30d supply, fill #0
  Filled 2020-12-31: qty 60, 30d supply, fill #1

## 2020-09-20 MED ORDER — PANTOPRAZOLE SODIUM 40 MG PO TBEC
40.0000 mg | DELAYED_RELEASE_TABLET | Freq: Every day | ORAL | 2 refills | Status: DC
  Filled 2020-09-20: qty 30, 30d supply, fill #0
  Filled 2020-10-05: qty 30, 30d supply, fill #1
  Filled 2020-11-06: qty 30, 30d supply, fill #2

## 2020-09-20 MED ORDER — DEXAMETHASONE 2 MG PO TABS
2.0000 mg | ORAL_TABLET | Freq: Two times a day (BID) | ORAL | 2 refills | Status: DC
  Filled 2020-09-20: qty 60, 30d supply, fill #0
  Filled 2020-11-06 – 2020-11-14 (×2): qty 60, 30d supply, fill #1

## 2020-09-20 NOTE — TOC Progression Note (Addendum)
Transition of Care (TOC) - Progression Note    Patient Details  Name: [name] [name] MRN: [id number] Date of Birth: 07/26/1955  Transition of Care (TOC) CM/SW Contact  Michelle R Stub[place]ldfield, RN Phone Number: 09/20/2020, 11:19 AM  Clinical Narrative:    Case management met with the patient at the bedside to discuss transitions of care to home - most likely discharge to home tomorrow. The patient receives assistance from multiple church members who call the patient daily and provide assistance with bills and grocery shopping.  The patient has been working with PT/OT on the mobility initiatives team to recondition the patient so he can return home with home health services.  I spoke with PT today and patient will need a charity RW and 3:1 through [employer].  Jesse Scinto called to approved charity dme prior to discharge to home on 09/21/2020.  Medications will be ordered through [facility] pharmacy and co-pay to be paid by church friend, Peggy Bass.  Lisa, CM with [facility] called and asked for charity home health to include PT/aide.  I spoke with Peggy Bass, church friend on the phone and clarified that the church would pick him up for discharge tomorrow, pick up his medications from [facility] and provide transportation for appointments.  Peggy Bass is aware that [facility] will call and speak with her regarding the patient's financial situation - and if patient does not quality for home health through charity - then outpatient PT/OT will be ordered.  [employer] called and charity RW and 3:1 ordered to be delivered to the hospital room.  Jesse Scinto, MSW supervisor is aware.  CM and MSW to continue to follow the patient for discharge home  - most likely 09/21/2020.   Expected Discharge Plan: Home w Home Health Services Barriers to Discharge: Family Issues,Inadequate or no insurance,Continued Medical Work up  Expected Discharge Plan and Services Expected  Discharge Plan: Home w Home Health Services In-house Referral: Clinical Social Work Discharge Planning Services: CM Consult,Medication Assistance,MATCH Program Post Acute Care Choice: Durable Medical Equipment,Home Health Living arrangements for the past 2 months: Apartment                 DME Arranged: 3-N-1,Walker rolling DME Agency: [facility] Date DME Agency Contacted: 09/20/20 Time DME Agency Contacted: 1111 Representative spoke with at DME Agency: [employer] CM HH Arranged: PT,Nurse's Aide HH Agency: [facility] Date HH Agency Contacted: 09/20/20 Time HH Agency Contacted: 1112 Representative spoke with at HH Agency: Lisa, CM with [facility] - will check with corporate for approval   Social Determinants of Health (SDOH) Interventions    Readmission Risk Interventions Readmission Risk Prevention Plan 09/20/2020  Transportation Screening Complete  PCP or Specialist Appt within 3-5 Days Complete  HRI or Home Care Consult Complete  Social Work Consult for Recovery Care Planning/Counseling Complete  Palliative Care Screening Complete  Medication Review (RN Care Manager) Complete  Some recent data might be hidden   

## 2020-09-20 NOTE — Plan of Care (Signed)
  Problem: Education: Goal: Knowledge of General Education information will improve Description: Including pain rating scale, medication(s)/side effects and non-pharmacologic comfort measures Outcome: Progressing   Problem: Health Behavior/Discharge Planning: Goal: Ability to manage health-related needs will improve Outcome: Progressing   Problem: Clinical Measurements: Goal: Ability to maintain clinical measurements within normal limits will improve Outcome: Progressing   Problem: Activity: Goal: Risk for activity intolerance will decrease Outcome: Progressing   Problem: Nutrition: Goal: Adequate nutrition will be maintained Outcome: Progressing   Problem: Elimination: Goal: Will not experience complications related to bowel motility Outcome: Progressing   Problem: Pain Managment: Goal: General experience of comfort will improve Outcome: Progressing

## 2020-09-20 NOTE — Progress Notes (Signed)
[facility] PROGRESS NOTE  [name] [name] ZHG:[id number] DOB: [birth year] DOA: 08/22/2020 PCP: [name], NP  Status: Remains inpatient appropriate because:Altered mental status and Unsafe d/c plan   Dispo:  Patient From:  Home  Planned Disposition: Home w/ chaity HH for short-term rehab on 09/21/2020  Medically stable for discharge:  Yes             Difficult to place: Yes     Level of care: Med-Surg  Code Status: Full Family Communication: Patient DVT prophylaxis: SCDs Vaccination status: Moderna COVID-vaccine 2/4 and 3/4.  Will order booster vaccine today 5/32   HPI:  65 year old male with history of COPD, non-small cell lung cancer status post chemoradiation, now on immunotherapy, active smoker who presented to the emergency department with further evaluation of right-sided weakness.  On presentation, he was hemodynamically stable.  Noncontrast head CT showed new 3.1 cm mass lesion involving the left posterior parietal convexity with large area of surrounding vasogenic edema and associated mass-effect.  CT with contrast confirmed 3.2 x 2.2 x 3 cm intra-axial mass lesion consistent with metastasis.  Started on Keppra, Decadron.   Oncology,neurooncology and neurosurgery consulted,underwent stereotactic left frontoparietal cranitomy for resection of tumor on  4/14 PT initially  recommending inpatient rehab on discharge but now the plan is SNF/ home with home health.   Patient does not have any funds to pay for skilled nursing facility.  We will be able to arrange for charity home health.  Patient has extensive assistance from church family who will be able to help him after discharge.  We have increased frequency of patient's PT and OT treatments and are hopeful that possibly by the end of the week he will be improved enough to discharge to home with home health.   Subjective: Alert.  Encouraged that he will likely be discharging soon.  Wants [place] to make sure we contact  [place].  Objective: Vitals:   09/20/20 0749 09/20/20 0832  BP:  126/68  Pulse:  90  Resp:  17  Temp:  98.5 F (36.9 C)  SpO2: 98% 100%    Intake/Output Summary (Last 24 hours) at 09/20/2020 0841 Last data filed at 09/20/2020 0835 Gross per 24 hour  Intake --  Output 1400 ml  Net -1400 ml   Filed Weights   08/23/20 0552  Weight: 56.3 kg    Exam: Constitutional: Alert, pleasant, no acute distress Respiratory: Anterior lung sounds clear to auscultation bilaterally, on room air Cardiovascular: S1-S2, regular pulse, no peripheral edema Abdomen:. LBM 5/4, eating well, normal active bowel sounds, nontender Neurologic: CN 2-12 grossly intact. Sensation intact, DTR normal. Strength 5/5 w/ mild R side weakness Psychiatric: Alert and oriented.  Continues to have some issues with midsentence dysarthria.  Pleasant affect.  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   5/2 discussed with Dr. Earlie Server who stated before initiation of any immunotherapy/chemotherapy that patient will need to follow-up in the office noting that previous treatment likely to be changed.  Physical deconditioning PT/OT initially recommended SNF.  As of 5/5 patient has improved and will be eligible for home health care and is ready for discharge home. Continues to have some balance problems secondary to right-sided weakness    Other problems: COPD:  Without exacerbation  stable  on room air Continue bronchodilators.     Data Reviewed: Basic Metabolic Panel: No results for input(s): NA, K, CL, CO2, GLUCOSE, BUN, CREATININE, CALCIUM, MG, PHOS in the last 168 hours. Liver Function Tests: No results for input(s): AST, ALT,  ALKPHOS, BILITOT, PROT, ALBUMIN in the last 168 hours. No results for input(s): LIPASE, AMYLASE in the last 168 hours. No results for input(s): AMMONIA in the last 168 hours. CBC: No results for input(s): WBC, NEUTROABS, HGB, HCT, MCV, PLT in the last 168 hours.  Studies: No results found.  Scheduled Meds: . Chlorhexidine Gluconate Cloth  6 each Topical Daily  . dexamethasone  2 mg Oral Q12H  . levETIRAcetam  500 mg Oral BID  . mometasone-formoterol  2 puff Inhalation BID  . pantoprazole  40 mg Oral Q supper  . senna  1 tablet Oral BID  . 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

## 2020-09-21 ENCOUNTER — Other Ambulatory Visit: Payer: Self-pay

## 2020-09-21 DIAGNOSIS — C7931 Secondary malignant neoplasm of brain: Secondary | ICD-10-CM | POA: Diagnosis not present

## 2020-09-21 NOTE — Discharge Summary (Signed)
Physician Discharge Summary  [name] [name] LPF:[id number] DOB: 06/22/1955 DOA: 08/22/2020  PCP: [name], NP  Admit date: 08/22/2020 Discharge date: 09/21/2020  Time spent: 37 minutes  Recommendations for Outpatient Follow-up:  1. Patient has follow-up appointment with Juluis Mire for internal medicine follow-up on June 15 at 2:30 PM 2. Ambulatory referral placed for patient to follow-up with Dr. Kathyrn Sheriff in 1 to 2 weeks after discharge 3. Ambulatory referral placed to Dr. Julien Nordmann for follow-up 1 week after discharge 4. Patient will receive DME from [facility] 5. Charity home health will be provided by [facility]   Discharge Diagnoses:  Principal Problem:   Brain metastasis ([place]) Active Problems:   COPD (chronic obstructive pulmonary disease) ([place])   Malignant neoplasm of bronchus of right upper lobe (H[facility])   Brain mass    Discharge Condition: Stable  Diet recommendation: Regular  Filed Weights   08/23/20 0552  Weight: 56.3 kg    History of present illness:  65 year old male with history of COPD, non-small cell lung cancer status post chemoradiation, now on immunotherapy, active smoker who presented to the emergency department with further evaluation of right-sided weakness. On presentation, he was hemodynamically stable. Noncontrast head CT showed new 3.1 cm mass lesion involving the left posterior parietal convexity with large area of surrounding vasogenic edema and associated mass-effect. CT with contrast confirmed 3.2 x 2.2 x 3 cm intra-axial mass lesion consistent with metastasis. Started on Keppra, Decadron.   Oncology,neurooncology and neurosurgery consulted,underwent stereotactic left frontoparietal cranitomy for resection of tumor on 4/14 PT initially recommending inpatient rehab on discharge but now the plan is SNF/ home with home health.   Patient does not have any funds to pay for skilled  nursing facility.  We will be able to arrange for charity home health.  Patient has extensive assistance from church family who will be able to help him after discharge.  We have increased frequency of patient's PT and OT treatments and are hopeful that possibly by the end of the week he will be improved enough to discharge to home with home health.  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   5/2 discussed with Dr. Earlie Server who stated before initiation of any immunotherapy/chemotherapy that patient will need to follow-up in the office noting that previous treatment likely to be changed.  Physical deconditioning PT/OT initially recommended SNF.  As of 5/5 patient has improved and will be eligible for home health care and is ready for discharge home. Continues to have some balance problems secondary to right-sided weakness    Other problems: COPD: Without exacerbation stable on room air Continue bronchodilators.   Procedures:  Stereotactic left frontoparietal craniectomy for resection of tumor on 4/14   Consultations:  Oncology  Neurology  Neurosurgery   Discharge Exam: Vitals:   09/21/20 0832 09/21/20 0840  BP:  106/66  Pulse: 80 92  Resp: 18 14  Temp:  98.1 F (36.7 C)  SpO2:  97%   Constitutional: Alert, pleasant, no acute distress-questions regarding discharge answered Respiratory:  Lungs are clear bilaterally, room air Cardiovascular: Normal heart sounds, no JVD, no peripheral edema, pulses Abdomen:. LBM 5/4, normoactive bowel sounds.  Nontender Neurologic: CN 2-12 grossly intact. Sensation intact, DTR  normal. Strength 5/5 w/ mild R side weakness Psychiatric: Alert  and oriented.  Continues to have some issues with midsentence dysarthria.  Pleasant affect.    Discharge Instructions   Discharge Instructions    Ambulatory referral to Hematology / Oncology   Complete by: As directed    Ambulatory referral to Neurosurgery   Complete by: As directed    Call MD for:  difficulty breathing, headache or visual disturbances   Complete by: As directed    Call MD for:  persistant dizziness or light-headedness   Complete by: As directed    Call MD for:  persistant nausea and vomiting   Complete by: As directed    Call MD for:  temperature >100.4   Complete by: As directed    Diet general   Complete by: As directed    Discharge instructions   Complete by: As directed    Please make sure you follow-up with your primary care physician as scheduled  A message has been sent to Dr. Cleotilde Neer office as well as to Dr. Worthy Flank office reminding them that you need follow-up appointments.  Please call their office to make sure appointment has been arranged.  Please take all medications as prescribed   Increase activity slowly   Complete by: As directed    May shower / Bathe   Complete by: As directed    No wound care   Complete by: As directed    Walker    Complete by: As directed      Allergies as of 09/21/2020      Reactions   Other Itching, Other (See Comments)   Seasonal allergies- Itchy eyes, runny nose, sneezing, scratchy throat      Medication List    STOP taking these medications   aspirin 81 MG EC tablet   prochlorperazine 10 MG tablet Commonly known as: COMPAZINE   senna-docusate 8.6-50 MG tablet Commonly known as: Senokot-S   sucralfate 1 g tablet Commonly known as: Carafate     TAKE these medications   acetaminophen 500 MG tablet Commonly known as: TYLENOL Take 500 mg by mouth every 6 (six) hours as needed for mild pain or moderate pain.   budesonide-formoterol 80-4.5 MCG/ACT inhaler Commonly known as: SYMBICORT Inhale 2  puffs into the lungs 2 (two) times daily. What changed:   when to take this  reasons to take this   dexamethasone 2 MG tablet Commonly known as: DECADRON Take 1 tablet (2 mg total) by mouth every 12 (twelve) hours.   feeding supplement Liqd Take 237 mLs by mouth 2 (two) times daily between meals.   levETIRAcetam 500 MG tablet Commonly known as: KEPPRA Take 1 tablet (500 mg total) by mouth 2 (two) times daily.   multivitamin tablet Take 1 tablet by mouth daily.   pantoprazole 40 MG tablet Commonly known as: PROTONIX Take 1 tablet (40 mg total) by mouth daily with supper.   senna 8.6 MG Tabs tablet Commonly known as: SENOKOT Take 1 tablet (8.6 mg total) by mouth 2 (two) times daily.   Spiriva HandiHaler 18 MCG inhalation capsule Generic drug: tiotropium Place 1 capsule into inhaler and inhale daily.   Ventolin HFA 108 (90 Base) MCG/ACT inhaler Generic drug: albuterol Inhale 2 puffs into the lungs every 4 (four) hours as needed for wheezing or shortness of breath.            Durable Medical Equipment  (From admission, onward)         Start  Ordered   09/20/20 1119  For home use only DME 3 n 1  Once        09/20/20 1118   09/20/20 1117  For home use only DME Walker rolling  Once       Question Answer Comment  Walker: With 5 Inch Wheels   Patient needs a walker to treat with the following condition S/P craniotomy      09/20/20 1118         Allergies  Allergen Reactions  . Other Itching and Other (See Comments)    Seasonal allergies- Itchy eyes, runny nose, sneezing, scratchy throat    Follow-up Information    [facility] Follow up.   Why: [facility] called and ordered rolling walker and 3:1 to be delivered to the hospital room. Contact information: [address]  [place] [address] [place], [facility] Follow up.   Specialty: Home Health Services Why: [facility] to determine if  they are able to provide charity home health for physical therapy and aide for home. Contact information: [place] [address] [phone]        [name], NP. Go to.   Specialty: Internal Medicine Why: You are scheduled for a hospital follow up on October 31, 2020 on 2:30 pm. Contact information: [place] [address] [phone]        Consuella Lose, MD. Call in 2 week(s).   Specialty: Neurosurgery Why: Call to arrange follow-up appointment. Contact information: [address] [place] [address] [phone]        Curt Bears, MD. Call in 1 week(s).   Specialty: Oncology Why: These call to arrange for follow-up appointment within 1 week after discharge Contact information: [place] [address] [phone]                The results of significant diagnostics from this hospitalization (including imaging, microbiology, ancillary and laboratory) are listed below for reference.    Significant Diagnostic Studies: X-ray chest PA or AP  Result Date: 08/30/2020 CLINICAL DATA:  Central line placement EXAM: CHEST  1 VIEW COMPARISON:  08/23/2020 FINDINGS: The heart size and mediastinal contours are within normal limits. There is a left chest vascular catheter, tip directed into the internal jugular and not imaged at the superior extent of the exam. Bullous change of the left apex, unchanged. The visualized skeletal structures are unremarkable. IMPRESSION: 1. There is a malpositioned left chest vascular catheter, tip directed into the internal jugular and not imaged at the superior extent of the exam. 2. No acute abnormality of the lungs. These results will be called to the ordering clinician or representative by the Radiologist Assistant, and communication documented in the PACS or [employer]. Electronically Signed   By: Eddie Candle M.D.   On: 08/30/2020 17:40    CT Head Wo Contrast  Result Date: 08/23/2020 CLINICAL DATA:  Acute neurological deficit.  Stroke suspected. EXAM: CT HEAD WITHOUT CONTRAST TECHNIQUE: Contiguous axial images were obtained from the base of the skull through the vertex without intravenous contrast. COMPARISON:  12/28/2019 FINDINGS: Brain: New development since previous study of a mass lesion in the left posterior parietal convexity. The mass measures 2.4 x 2.4 x 3.1 cm in diameter. There is a large area of surrounding vasogenic edema throughout the left parietal lobe. There is associated mass effect with effacement  of the left lateral ventricles and of the left parietal temporal sulci. No significant midline shift. Underlying cerebral atrophy. No ventricular dilatation. No acute intracranial hemorrhage. 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 Skull: Postoperative changes with right frontal and temporal craniotomy. No acute depressed skull fractures. Sinuses/Orbits: Paranasal sinuses are clear. Bilateral mastoid effusions. Other: None. IMPRESSION: 1. New development of a 3.1 cm mass lesion in the left posterior parietal convexity with a large area of surrounding vasogenic edema and associated mass effect. This is consistent with metastatic disease. 2. No acute intracranial hemorrhage. 3. Postoperative changes with right frontal and temporal craniotomy. 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 4. Bilateral mastoid effusions. Electronically Signed   By: Lucienne Capers M.D.   On: 08/23/2020 01:18   CT HEAD W CONTRAST  Result Date: 08/23/2020 CLINICAL DATA:  Acute neurological deficit. Intracranial mass lesion. Non-small cell lung cancer. EXAM: CT HEAD WITH CONTRAST TECHNIQUE: Contiguous axial images were obtained from the base of the skull through the vertex with intravenous contrast. CONTRAST:  31mL OMNIPAQUE IOHEXOL 300 MG/ML  SOLN COMPARISON:  Noncontrast head CT earlier same day. FINDINGS: Brain: No abnormality affects  the brainstem or cerebellum. 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 Volume loss of the right anterior temporal lobe. No acute parenchymal finding affects the right hemisphere. On the left, there is an intra-axial mass lesion at parietal vertex measuring 3.2 x 2.2 x 3.0 cm. Pronounced regional vasogenic edema. Metastatic disease would be the most common cause. Primary brain tumor is possible but less likely. No second intracranial lesion is identified. Vascular: Flow is present in the major vessels. Cannot evaluate the region of the aneurysm well because artifact from the clip. Skull: Right pterional craniotomy/craniectomy as described above. Sinuses/Orbits: Clear/normal Other: None IMPRESSION: 1. 3.2 x 2.2 x 3.0 cm intra-axial mass lesion at the parietal vertex on the left with regional vasogenic edema. Metastatic disease would be the most common cause. Primary brain tumor is possible but less likely. 2. 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 Cannot evaluate the region of the aneurysm well because of artifact from the clip. Electronically Signed   By: Nelson Chimes M.D.   On: 08/23/2020 14:35   CT HEAD W & WO CONTRAST  Result Date: 08/30/2020 CLINICAL DATA:  Postop craniotomy EXAM: CT HEAD WITHOUT AND WITH CONTRAST TECHNIQUE: Contiguous axial images were obtained from the base of the skull through the vertex without and with intravenous contrast CONTRAST:  173mL OMNIPAQUE IOHEXOL 300 MG/ML  SOLN COMPARISON:  None. FINDINGS: Brain: Large area of vasogenic edema in the left hemisphere, slightly decreased. The dense mass at the superior left convexity has been resected. There is a small amount of blood and pneumocephalus over the left hemisphere near the operative site. No nodular contrast enhancement visible at  the resection site. Vascular: Remote aneurysm clipping. Skull: Old right pterional craniotomy. New superior left parietal craniotomy. Sinuses/Orbits: Negative Other: None IMPRESSION: 1. Status post resection of superior left convexity mass. Small amount of blood and pneumocephalus over the left hemisphere near the operative site. 2. Slightly decreased vasogenic edema in the left hemisphere. 3. No nodular contrast enhancement at the resection site. Electronically Signed   By: Ulyses Jarred M.D.   On: 08/30/2020 21:58   CT HEAD W & WO CONTRAST  Result Date: 08/29/2020 CLINICAL DATA:  Initial evaluation for brain mass, preoperative planning. EXAM: CT HEAD WITHOUT AND WITH CONTRAST TECHNIQUE: Contiguous axial images were obtained from the base of the skull through the vertex without and with intravenous contrast CONTRAST:  70mL OMNIPAQUE IOHEXOL 300 MG/ML  SOLN COMPARISON:  Prior head CT from 08/23/2020 FINDINGS: Brain: Again seen is a focal intraparenchymal enhancing mass centered along the gray-white matter differentiation at the left parietal convexity. Lesion measures 3.1 x 1.9 x 3.3 cm (transverse by craniocaudad by AP). Surrounding vasogenic edema throughout the posterior left frontoparietal region without significant regional mass effect. No other mass lesion or abnormal enhancement. Underlying age-related cerebral atrophy with mild chronic small vessel ischemic disease. Remote lacunar infarct present at the right caudate head. 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 Postsurgical encephalomalacia present at the anterior right temporal pole. No acute intracranial hemorrhage. No other acute large vessel territory infarct. No hydrocephalus or extra-axial fluid collection. Vascular: No hyperdense vessel on precontrast images. Normal intravascular enhancement seen following contrast administration. Prior surgical clipping of aneurysm near the right ICA terminus. Skull:  Remote right pterional craniotomy. No focal osseous lesions. Scalp soft tissues demonstrate no acute finding. Sinuses/Orbits: Globes and orbital soft tissues within normal limits. Paranasal sinuses are largely clear. Small right mastoid effusion noted, of doubtful significance. Other: None. IMPRESSION: 1. 3.1 x 1.9 x 3.3 cm enhancing mass centered along the gray-white matter differentiation at the left parietal convexity with surrounding vasogenic edema. A solitary intracranial metastasis is favored. A primary CNS neoplasm would be the primary differential consideration. 2. 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 3. 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 Electronically Signed   By: Jeannine Boga M.D.   On: 08/29/2020 00:07   DG Chest Portable 1 View  Result Date: 08/23/2020 CLINICAL DATA:  Right arm numbness, leg numbness. EXAM: PORTABLE CHEST 1 VIEW COMPARISON:  None. FINDINGS: Chronic changes in the upper lobes with scarring and emphysema. Heart is normal size. No acute confluent opacities or effusions. No acute bony abnormality. IMPRESSION: COPD/chronic changes.  No active disease. Electronically Signed   By: Rolm Baptise M.D.   On: 08/23/2020 00:53    Microbiology: No results found for this or any previous visit (from the past 240 hour(s)).   Labs: Basic Metabolic Panel: No results for input(s): NA, K, CL, CO2, GLUCOSE, BUN, CREATININE, CALCIUM, MG, PHOS in the last 168 hours. Liver Function Tests: No results for input(s): AST, ALT, ALKPHOS, BILITOT, PROT, ALBUMIN in the last 168 hours. No results for input(s): LIPASE, AMYLASE in the last 168 hours. No results for input(s): AMMONIA in the last 168 hours. CBC: No results for input(s): WBC, NEUTROABS, HGB, HCT, MCV, PLT in the last 168 hours. Cardiac Enzymes: No results for input(s): CKTOTAL, CKMB, CKMBINDEX, TROPONINI in the last 168 hours. BNP: BNP (last 3 results) No results for input(s): BNP in  the last 8760 hours.  ProBNP (last 3 results) No results for input(s): PROBNP in the last 8760 hours.  CBG: No results for input(s): GLUCAP in the last 168 hours.     Signed:  Erin Hearing ANP [facility] 09/21/2020, 10:16 AM

## 2020-09-21 NOTE — Progress Notes (Addendum)
CSW spoke with patient's church friend Vickii Chafe to inform her that patient is ready for discharge. Vickii Chafe states either her or her husband will come pick up the patient and take him home around 2pm today. Patient's medications are ready for pickup at [facility].  CSW notified RN of discharge information - confirmed that patient's equipment has been delivered.  Madilyn Fireman, MSW, LCSW Transitions of Care  Clinical Social Worker II [phone]

## 2020-09-21 NOTE — Progress Notes (Signed)
Occupational Therapy Treatment Patient Details Name: [name] [name] MRN: [id number] DOB: July 16, 1955 Today's Date: 09/21/2020    History of present illness 65 y.o. male who presented 08/22/20 with R-sided weakness and numbness. Noncontrast head CT concerning for new 3.1 cm mass lesion involving the left posterior parietal convexity with large area of surrounding vasogenic edema and associated mass-effect. This is consistent with metastatic disease. S/p stereotactic L frontoparietal craniotomy for resection of tumor 4/14. PMH: HTN, anemia, bullous emphysema, COPD, and non-small cell lung cancer status post chemoradiation and now on immunotherapy.   OT comments  Freman has made great progress towards all OT goals. To prepare for d/c home today he completed ADLs at mod I level with no safety concerns or LOB. Brycen verbalized understanding of his medication regimen, and plans for meals and appointments after d/c. This therapist went through HEP with supplies given during this hospital stay, and pt verbalized understanding. If pt does not d/c today, recommend continued OT acutely to maximize function in ADLs/mobility. Recommend pt d/c to home with supervision initally for all ADLs/IADLs and functional mobility.     Follow Up Recommendations  No OT follow up;Supervision/Assistance - 24 hour    Equipment Recommendations  3 in 1 bedside commode       Precautions / Restrictions Precautions Precautions: Fall Precaution Comments: R sided weakness Restrictions Weight Bearing Restrictions: No       Mobility Bed Mobility Overal bed mobility: Modified Independent        General bed mobility comments: in chair on arrival    Transfers Overall transfer level: Modified independent Equipment used: None Transfers: Sit to/from Stand Sit to Stand: Modified independent (Device/Increase time)         General transfer comment: Mod I, no LOB or knee buckling this session    Balance Overall  balance assessment: Needs assistance Sitting-balance support: Feet unsupported;No upper extremity supported Sitting balance-Leahy Scale: Good     Standing balance support: No upper extremity supported Standing balance-Leahy Scale: Fair       ADL either performed or assessed with clinical judgement   ADL Overall ADL's : Needs assistance/impaired Eating/Feeding: Independent;Sitting   Grooming: Wash/dry hands;Wash/dry face;Oral care;Applying deodorant;Brushing hair;Modified independent;Sitting;Standing (at sink)   Upper Body Bathing: Modified independent;Standing (at sink)   Lower Body Bathing: Modified independent;Sit to/from stand (at sink)   Upper Body Dressing : Modified independent;Sitting   Lower Body Dressing: Modified independent;Sit to/from stand               Functional mobility during ADLs: Modified independent General ADL Comments: Pt completed ADLs this session in preparation for d/c home today at mod I level, no safety concerns noted, no LOB               Cognition Arousal/Alertness: Awake/alert Behavior During Therapy: WFL for tasks assessed/performed Overall Cognitive Status: Impaired/Different from baseline Area of Impairment: Problem solving (slow processing)          Current Attention Level: Sustained         Problem Solving: Slow processing                General Comments no skin integrity issues noted    Pertinent Vitals/ Pain       Pain Assessment: No/denies pain Pain Score: 0-No pain Pain Intervention(s): Monitored during session         Frequency  Min 2X/week        Progress Toward Goals  OT Goals(current goals can now be found in the  care plan section)  Progress towards OT goals: Progressing toward goals  Acute Rehab OT Goals Patient Stated Goal: go home OT Goal Formulation: With patient Time For Goal Achievement: 10/01/20 Potential to Achieve Goals: Good ADL Goals Pt Will Perform Lower Body Bathing: with  modified independence;sit to/from stand Pt Will Perform Lower Body Dressing: with modified independence;sit to/from stand Pt Will Transfer to Toilet: with modified independence;ambulating Pt Will Perform Tub/Shower Transfer: with modified independence;ambulating;tub bench Additional ADL Goal #1: Pt will complete 3 step trail making task in moderately distracting environment with minimal redirectional cues Additional ADL Goal #2: Pt will complete HEP with RUE with min vc to increase functional use RUE Additional ADL Goal #3: Pt will complete all bathing and dressing ADLs with mod I to promote safe transition into home environment  Plan Discharge plan needs to be updated       AM-PAC OT "6 Clicks" Daily Activity     Outcome Measure   Help from another person eating meals?: None Help from another person taking care of personal grooming?: None Help from another person toileting, which includes using toliet, bedpan, or urinal?: None Help from another person bathing (including washing, rinsing, drying)?: None Help from another person to put on and taking off regular upper body clothing?: None Help from another person to put on and taking off regular lower body clothing?: None 6 Click Score: 24    End of Session    OT Visit Diagnosis: Unsteadiness on feet (R26.81);Other abnormalities of gait and mobility (R26.89);Muscle weakness (generalized) (M62.81);Apraxia (R48.2);Other symptoms and signs involving cognitive function;Hemiplegia and hemiparesis Hemiplegia - Right/Left: Right Hemiplegia - dominant/non-dominant: Non-Dominant Hemiplegia - caused by: Unspecified   Activity Tolerance Patient tolerated treatment well   Patient Left in bed;with call bell/phone within reach   Nurse Communication Mobility status        Time: 0254-2706 OT Time Calculation (min): 15 min  Charges: OT General Charges $OT Visit: 1 Visit OT Treatments $Self Care/Home Management : 8-22 mins     Chandel Zaun A  Kyreese Chio 09/21/2020, 12:08 PM

## 2020-09-21 NOTE — Plan of Care (Signed)
  Problem: Education: Goal: Knowledge of General Education information will improve Description: Including pain rating scale, medication(s)/side effects and non-pharmacologic comfort measures Outcome: Adequate for Discharge   Problem: Health Behavior/Discharge Planning: Goal: Ability to manage health-related needs will improve Outcome: Adequate for Discharge   Problem: Clinical Measurements: Goal: Ability to maintain clinical measurements within normal limits will improve Outcome: Adequate for Discharge Goal: Will remain free from infection Outcome: Adequate for Discharge Goal: Diagnostic test results will improve Outcome: Adequate for Discharge Goal: Respiratory complications will improve Outcome: Adequate for Discharge Goal: Cardiovascular complication will be avoided Outcome: Adequate for Discharge   Problem: Activity: Goal: Risk for activity intolerance will decrease Outcome: Adequate for Discharge   Problem: Nutrition: Goal: Adequate nutrition will be maintained Outcome: Adequate for Discharge   Problem: Coping: Goal: Level of anxiety will decrease Outcome: Adequate for Discharge   Problem: Elimination: Goal: Will not experience complications related to bowel motility Outcome: Adequate for Discharge   Problem: Pain Managment: Goal: General experience of comfort will improve Outcome: Adequate for Discharge   Problem: Safety: Goal: Ability to remain free from injury will improve Outcome: Adequate for Discharge   Problem: Skin Integrity: Goal: Risk for impaired skin integrity will decrease Outcome: Adequate for Discharge   Problem: Acute Rehab PT Goals(only PT should resolve) Goal: Patient Will Transfer Sit To/From Stand Outcome: Adequate for Discharge Goal: Pt Will Transfer Bed To Chair/Chair To Bed Outcome: Adequate for Discharge Goal: Pt Will Ambulate Outcome: Adequate for Discharge   Problem: Acute Rehab OT Goals (only OT should resolve) Goal: OT  Additional ADL Goal #1 Outcome: Adequate for Discharge Goal: OT Additional ADL Goal #2 Outcome: Adequate for Discharge   Problem: Acute Rehab PT Goals(only PT should resolve) Goal: PT Additional Goal #1 Outcome: Adequate for Discharge   Problem: Acute Rehab OT Goals (only OT should resolve) Goal: Pt. Will Perform Tub/Shower Transfer Outcome: Adequate for Discharge Goal: OT Additional ADL Goal #3 Outcome: Adequate for Discharge

## 2020-09-21 NOTE — Progress Notes (Signed)
Pt was given his AVS discharge summary and went over with him. DME is in room for pt to take home with him. IV was removed with catheter intact. Pt had no further questions. Pt transport cant come until later this afternoon to pick him up.

## 2020-09-21 NOTE — Progress Notes (Signed)
Physical Therapy Treatment Patient Details Name: [name] [name] MRN: [id number] DOB: Apr 25, 1956 Today's Date: 09/21/2020    History of Present Illness 65 y.o. male who presented 08/22/20 with R-sided weakness and numbness. Noncontrast head CT concerning for new 3.1 cm mass lesion involving the left posterior parietal convexity with large area of surrounding vasogenic edema and associated mass-effect. This is consistent with metastatic disease. S/p stereotactic L frontoparietal craniotomy for resection of tumor 4/14. PMH: HTN, anemia, bullous emphysema, COPD, and non-small cell lung cancer status post chemoradiation and now on immunotherapy.    PT Comments    Pt continues to progress with mobility and is eager to return home today.  Feel he will manage well with mobility in the home.    Follow Up Recommendations  Home health PT;Supervision - Intermittent     Equipment Recommendations  None recommended by PT    Recommendations for Other Services       Precautions / Restrictions Precautions Precautions: Fall Precaution Comments: R sided weakness Restrictions Weight Bearing Restrictions: No    Mobility  Bed Mobility Overal bed mobility: Modified Independent             General bed mobility comments: Pt up in chair    Transfers Overall transfer level: Modified independent Equipment used: None Transfers: Sit to/from Stand Sit to Stand: Modified independent (Device/Increase time)         General transfer comment: Steady to rise  Ambulation/Gait Ambulation/Gait assistance: Supervision;Modified independent (Device/Increase time) Gait Distance (Feet): 300 Feet (x 2) Assistive device: None Gait Pattern/deviations: Step-through pattern;Decreased weight shift to right;Narrow base of support Gait velocity: decr Gait velocity interpretation: <1.31 ft/sec, indicative of household ambulator General Gait Details: modified independent in small environment like room or gym.  Supervision for more open busier environment like hallway   Stairs Stairs: Yes Stairs assistance: Min guard Stair Management: One rail Left;No rails;Step to pattern;Forwards Number of Stairs: 2 General stair comments: Performed 2 stairs and also performed curb type step   Wheelchair Mobility    Modified Rankin (Stroke Patients Only)       Balance Overall balance assessment: Needs assistance Sitting-balance support: Feet unsupported;No upper extremity supported Sitting balance-Leahy Scale: Good     Standing balance support: No upper extremity supported Standing balance-Leahy Scale: Good                              Cognition Arousal/Alertness: Awake/alert Behavior During Therapy: WFL for tasks assessed/performed Overall Cognitive Status: Impaired/Different from baseline Area of Impairment: Problem solving                   Current Attention Level: Sustained         Problem Solving: Slow processing        Exercises      General Comments General comments (skin integrity, edema, etc.): no skin integrity issues noted      Pertinent Vitals/Pain Pain Assessment: No/denies pain Pain Score: 0-No pain Pain Intervention(s): Monitored during session    Home Living                      Prior Function            PT Goals (current goals can now be found in the care plan section) Acute Rehab PT Goals Patient Stated Goal: go home PT Goal Formulation: With patient Time For Goal Achievement: 09/28/20 Potential to Achieve Goals: Good Progress towards PT  goals: Progressing toward goals;Goals met and updated - see care plan    Frequency    Min 3X/week      PT Plan Discharge plan needs to be updated    Co-evaluation              AM-PAC PT "6 Clicks" Mobility   Outcome Measure  Help needed turning from your back to your side while in a flat bed without using bedrails?: None Help needed moving from lying on your back to  sitting on the side of a flat bed without using bedrails?: None Help needed moving to and from a bed to a chair (including a wheelchair)?: None Help needed standing up from a chair using your arms (e.g., wheelchair or bedside chair)?: None Help needed to walk in hospital room?: None Help needed climbing 3-5 steps with a railing? : A Little 6 Click Score: 23    End of Session   Activity Tolerance: Patient tolerated treatment well Patient left: in chair;with call bell/phone within reach   PT Visit Diagnosis: Other abnormalities of gait and mobility (R26.89) Hemiplegia - Right/Left: Right Hemiplegia - dominant/non-dominant: Non-dominant Hemiplegia - caused by: Unspecified (tumor)     Time: 1594-5859 PT Time Calculation (min) (ACUTE ONLY): 13 min  Charges:  $Gait Training: 8-22 mins                     [place] Pager [phone] Office [place] 09/21/2020, 1:32 PM

## 2020-09-26 ENCOUNTER — Encounter: Payer: Self-pay | Admitting: Urology

## 2020-09-26 NOTE — Progress Notes (Addendum)
Patient is aware that this is a phone visit. Patient denies any pain at this time. Patient states that he has headaches sometime ,but that he takes tylenol for relief.Patient states that he has mild fatigue.Patient states that he has an unsteady gait. Patient denies any issues with hand  coordination.

## 2020-09-27 ENCOUNTER — Other Ambulatory Visit: Payer: Self-pay

## 2020-09-27 ENCOUNTER — Inpatient Hospital Stay (HOSPITAL_BASED_OUTPATIENT_CLINIC_OR_DEPARTMENT_OTHER): Payer: Medicaid Other | Admitting: Internal Medicine

## 2020-09-27 ENCOUNTER — Telehealth: Payer: Self-pay | Admitting: Medical Oncology

## 2020-09-27 ENCOUNTER — Inpatient Hospital Stay: Payer: Medicaid Other

## 2020-09-27 ENCOUNTER — Encounter: Payer: Self-pay | Admitting: Internal Medicine

## 2020-09-27 ENCOUNTER — Inpatient Hospital Stay: Payer: Medicaid Other | Attending: Internal Medicine

## 2020-09-27 VITALS — BP 132/75 | HR 88 | Temp 97.6°F | Resp 19 | Ht 61.0 in | Wt 131.0 lb

## 2020-09-27 DIAGNOSIS — Z5112 Encounter for antineoplastic immunotherapy: Secondary | ICD-10-CM

## 2020-09-27 DIAGNOSIS — C7931 Secondary malignant neoplasm of brain: Secondary | ICD-10-CM

## 2020-09-27 DIAGNOSIS — C3490 Malignant neoplasm of unspecified part of unspecified bronchus or lung: Secondary | ICD-10-CM | POA: Diagnosis not present

## 2020-09-27 DIAGNOSIS — C3491 Malignant neoplasm of unspecified part of right bronchus or lung: Secondary | ICD-10-CM | POA: Insufficient documentation

## 2020-09-27 DIAGNOSIS — Z79899 Other long term (current) drug therapy: Secondary | ICD-10-CM | POA: Diagnosis not present

## 2020-09-27 DIAGNOSIS — Z5111 Encounter for antineoplastic chemotherapy: Secondary | ICD-10-CM

## 2020-09-27 DIAGNOSIS — C3411 Malignant neoplasm of upper lobe, right bronchus or lung: Secondary | ICD-10-CM

## 2020-09-27 LAB — CBC WITH DIFFERENTIAL (CANCER CENTER ONLY)
Abs Immature Granulocytes: 0.27 10*3/uL — ABNORMAL HIGH (ref 0.00–0.07)
Basophils Absolute: 0 10*3/uL (ref 0.0–0.1)
Basophils Relative: 1 %
Eosinophils Absolute: 0.1 10*3/uL (ref 0.0–0.5)
Eosinophils Relative: 1 %
HCT: 38.9 % — ABNORMAL LOW (ref 39.0–52.0)
Hemoglobin: 12.2 g/dL — ABNORMAL LOW (ref 13.0–17.0)
Immature Granulocytes: 5 %
Lymphocytes Relative: 17 %
Lymphs Abs: 0.9 10*3/uL (ref 0.7–4.0)
MCH: 25.4 pg — ABNORMAL LOW (ref 26.0–34.0)
MCHC: 31.4 g/dL (ref 30.0–36.0)
MCV: 80.9 fL (ref 80.0–100.0)
Monocytes Absolute: 0.5 10*3/uL (ref 0.1–1.0)
Monocytes Relative: 9 %
Neutro Abs: 3.4 10*3/uL (ref 1.7–7.7)
Neutrophils Relative %: 67 %
Platelet Count: 180 10*3/uL (ref 150–400)
RBC: 4.81 MIL/uL (ref 4.22–5.81)
RDW: 16.1 % — ABNORMAL HIGH (ref 11.5–15.5)
WBC Count: 5.1 10*3/uL (ref 4.0–10.5)
nRBC: 0 % (ref 0.0–0.2)

## 2020-09-27 LAB — CMP (CANCER CENTER ONLY)
ALT: 24 U/L (ref 0–44)
AST: 13 U/L — ABNORMAL LOW (ref 15–41)
Albumin: 3.7 g/dL (ref 3.5–5.0)
Alkaline Phosphatase: 64 U/L (ref 38–126)
Anion gap: 12 (ref 5–15)
BUN: 17 mg/dL (ref 8–23)
CO2: 25 mmol/L (ref 22–32)
Calcium: 9.2 mg/dL (ref 8.9–10.3)
Chloride: 105 mmol/L (ref 98–111)
Creatinine: 0.74 mg/dL (ref 0.61–1.24)
GFR, Estimated: >60 mL/min (ref >60)
Glucose, Bld: 88 mg/dL (ref 70–99)
Potassium: 3.9 mmol/L (ref 3.5–5.1)
Sodium: 142 mmol/L (ref 135–145)
Total Bilirubin: 0.2 mg/dL — ABNORMAL LOW (ref 0.3–1.2)
Total Protein: 6.9 g/dL (ref 6.5–8.1)

## 2020-09-27 LAB — TSH: TSH: 1.159 u[IU]/mL (ref 0.320–4.118)

## 2020-09-27 MED ORDER — FOLIC ACID 1 MG PO TABS
1.0000 mg | ORAL_TABLET | Freq: Every day | ORAL | 4 refills | Status: DC
  Filled 2020-09-27 – 2020-10-09 (×2): qty 30, 30d supply, fill #0
  Filled 2020-11-06: qty 30, 30d supply, fill #1
  Filled 2020-12-21: qty 30, 30d supply, fill #2
  Filled 2021-02-26 – 2021-03-11 (×2): qty 30, 30d supply, fill #3

## 2020-09-27 MED ORDER — CYANOCOBALAMIN 1000 MCG/ML IJ SOLN
1000.0000 ug | Freq: Once | INTRAMUSCULAR | Status: AC
  Administered 2020-09-27: 1000 ug via INTRAMUSCULAR

## 2020-09-27 MED ORDER — CYANOCOBALAMIN 1000 MCG/ML IJ SOLN
INTRAMUSCULAR | Status: AC
  Filled 2020-09-27: qty 1

## 2020-09-27 NOTE — Telephone Encounter (Signed)
Jakin wants his friend [name] to know what Dr Julien Nordmann told him today about switching treatment.

## 2020-09-27 NOTE — Progress Notes (Signed)
.[facility] Telephone:(336) [phone]   Fax:([phone]) [phone]  OFFICE PROGRESS NOTE  [name], NP [address] [place] [address]  DIAGNOSIS: Stage IIIA (Tx, N2, M0) non-small cell lung cancer, adenocarcinoma diagnosed in August 2021 and presented with large right paratracheal soft tissue mass extending from the right apex to the right suprahilar and precarinal region.  Molecular studies by [employer]:  [id number], 31.9%, Olaparib  [id number], 68.6%. None   BRAFAmplification, High (+++) Plasma Copy Number: 5.0  PRIOR THERAPY:  1) Concurrent chemoradiation with weekly carboplatin for AUC of 2 and paclitaxel 45 mg/M2.  First dose January 17, 2020.  Status post 7 cycles.  Last dose was given on February 28, 2020 2) Consolidation treatment with immunotherapy with Imfinzi 1500 mg IV every 4 weeks.  First dose April 09, 2020.  Status post 5 cycles.  Last dose was given July 30, 2020 discontinued secondary to disease progression with brain metastasis. 3) Stereotactic left frontoparietal craniotomy for resection of tumor on 08/30/2020.   CURRENT THERAPY: First-line systemic chemotherapy with carboplatin for AUC of 5, Alimta 500 Mg/M2 and Keytruda 200 Mg IV every 3 weeks.  First dose Oct 08, 2020.   INTERVAL HISTORY: [name] [name] 65 y.o. male returns to the clinic today for follow-up visit.  The patient continues to have weakness and the right lower extremity.  He is currently undergoing physical therapy and rehabilitation at home after discharge from the inpatient rehab facility. 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  The patient is here today for evaluation and recommendation regarding treatment of his condition.  He denied having any current chest pain, shortness of breath, cough or hemoptysis.  He denied having any fever or chills.  He has no nausea, vomiting, diarrhea or constipation.  MEDICAL HISTORY: Past Medical History:  Diagnosis Date  . 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[facility]) 12/25/2019  . COPD (chronic obstructive pulmonary disease) ([facility]) 11/02/2007   Qualifier: Diagnosis of  By: Melvyn Novas MD, Christena Deem   . Hypertension 12/24/2019  . Iron deficiency anemia 08/23/2007   Qualifier: Diagnosis of  By: Jim Like      ALLERGIES:  is allergic to other.  MEDICATIONS:  Current Outpatient Medications  Medication Sig Dispense Refill  . acetaminophen (TYLENOL) 500 MG tablet Take 500 mg by mouth every 6 (six) hours as needed for mild pain or moderate pain.    . budesonide-formoterol (SYMBICORT) 80-4.5 MCG/ACT inhaler Inhale 2 puffs into the lungs 2 (two) times daily. (Patient taking differently: Inhale 2 puffs into the lungs 2 (two) times daily as needed (for flares of wheezing and/or shortness of breath).) 1 Inhaler 11  . dexamethasone (DECADRON) 2 MG tablet Take 1 tablet (2 mg total) by mouth every 12 (twelve) hours. 60 tablet 2  . feeding supplement (ENSURE ENLIVE / ENSURE PLUS) LIQD Take 237 mLs by mouth 2 (two) times daily between meals. 237 mL 12  . levETIRAcetam (KEPPRA) 500 MG tablet Take 1 tablet (500 mg total) by mouth 2 (two) times daily. 60 tablet 2  . Multiple Vitamin (MULTIVITAMIN) tablet Take 1 tablet by mouth daily.    . pantoprazole (PROTONIX) 40 MG tablet Take 1 tablet (40 mg total) by  mouth daily with supper. 30 tablet 2  . senna (SENOKOT) 8.6 MG TABS tablet Take 1 tablet (8.6 mg total) by mouth 2 (two) times daily. 120 tablet 0  . SPIRIVA HANDIHALER 18 MCG inhalation capsule Place 1 capsule into inhaler and inhale daily.    . VENTOLIN HFA 108 (90 Base) MCG/ACT inhaler Inhale 2 puffs  into the lungs every 4 (four) hours as needed for wheezing or shortness of breath.     No current facility-administered medications for this visit.    SURGICAL HISTORY:  Past Surgical History:  Procedure Laterality Date  . APPLICATION OF CRANIAL NAVIGATION N/A 08/30/2020   Procedure: APPLICATION OF CRANIAL NAVIGATION;  Surgeon: Consuella Lose, MD;  Location: [facility];  Service: Neurosurgery;  Laterality: N/A;  . CRANIOTOMY Left 08/30/2020   Procedure: Stereotactic left frontoparietal craniectomy for resection of tumor with brainlab;  Surgeon: Consuella Lose, MD;  Location: [facility];  Service: Neurosurgery;  Laterality: Left;  . ENDOBRONCHIAL ULTRASOUND N/A 12/27/2019   Procedure: ENDOBRONCHIAL ULTRASOUND;  Surgeon: Laurin Coder, MD;  Location: [facility];  Service: Endoscopy;  Laterality: N/A;  . FINE NEEDLE ASPIRATION  12/27/2019   Procedure: FINE NEEDLE ASPIRATION;  Surgeon: Laurin Coder, MD;  Location: [facility];  Service: Endoscopy;;  . VIDEO BRONCHOSCOPY N/A 12/27/2019   Procedure: VIDEO BRONCHOSCOPY WITHOUT FLUORO;  Surgeon: Laurin Coder, MD;  Location: [facility];  Service: Endoscopy;  Laterality: N/A;    REVIEW OF SYSTEMS:  Constitutional: positive for fatigue Eyes: negative Ears, nose, mouth, throat, and face: negative Respiratory: negative Cardiovascular: negative Gastrointestinal: negative Genitourinary:negative Integument/breast: negative Hematologic/lymphatic: negative Musculoskeletal:positive for muscle weakness Neurological: positive for weakness Behavioral/Psych: negative Endocrine: negative Allergic/Immunologic: negative   PHYSICAL EXAMINATION: General appearance: alert, cooperative, fatigued and no distress Head: Normocephalic, without obvious abnormality, atraumatic Neck: no adenopathy, no JVD, supple, symmetrical, trachea midline and thyroid not enlarged, symmetric, no tenderness/mass/nodules Lymph nodes: Cervical, supraclavicular, and  axillary nodes normal. Resp: clear to auscultation bilaterally Back: symmetric, no curvature. ROM normal. No CVA tenderness. Cardio: regular rate and rhythm, S1, S2 normal, no murmur, click, rub or gallop GI: soft, non-tender; bowel sounds normal; no masses,  no organomegaly Extremities: extremities normal, atraumatic, no cyanosis or edema Neurologic: Alert and oriented X 3, normal strength and tone. Normal symmetric reflexes. Normal coordination and gait  ECOG PERFORMANCE STATUS: 1 - Symptomatic but completely ambulatory  Blood pressure 132/75, pulse 88, temperature 97.6 F (36.4 C), temperature source Tympanic, resp. rate 19, height 5\' 1"  (1.549 m), weight 131 lb (59.4 kg), SpO2 100 %.  LABORATORY DATA: Lab Results  Component Value Date   WBC 5.1 09/27/2020   HGB 12.2 (L) 09/27/2020   HCT 38.9 (L) 09/27/2020   MCV 80.9 09/27/2020   PLT 180 09/27/2020      Chemistry      Component Value Date/Time   NA 142 09/27/2020 0952   NA 144 08/03/2019 1527   K 3.9 09/27/2020 0952   CL 105 09/27/2020 0952   CO2 25 09/27/2020 0952   BUN 17 09/27/2020 0952   BUN 9 08/03/2019 1527   CREATININE 0.74 09/27/2020 0952      Component Value Date/Time   CALCIUM 9.2 09/27/2020 0952   ALKPHOS 64 09/27/2020 0952   AST 13 (L) 09/27/2020 0952   ALT 24 09/27/2020 0952   BILITOT 0.2 (L) 09/27/2020 0952       RADIOGRAPHIC STUDIES: X-ray chest PA or AP  Result Date: 08/30/2020 CLINICAL DATA:  Central line placement EXAM: CHEST  1 VIEW COMPARISON:  08/23/2020 FINDINGS: The heart size and mediastinal contours are within normal limits. There is a left chest vascular catheter, tip directed into the internal jugular and not imaged at the superior extent of the exam. Bullous change of the left apex, unchanged. The visualized skeletal structures are unremarkable. IMPRESSION: 1. There is a malpositioned left chest vascular catheter, tip directed into the internal jugular and not imaged at the superior  extent of the exam. 2. No acute abnormality of the lungs. These results will be called to the ordering clinician or representative by the Radiologist Assistant, and communication documented in the PACS or [employer]. Electronically Signed   By: Eddie Candle M.D.   On: 08/30/2020 17:40   CT HEAD W & WO CONTRAST  Result Date: 08/30/2020 CLINICAL DATA:  Postop craniotomy EXAM: CT HEAD WITHOUT AND WITH CONTRAST TECHNIQUE: Contiguous axial images were obtained from the base of the skull through the vertex without and with intravenous contrast CONTRAST:  150mL OMNIPAQUE IOHEXOL 300 MG/ML  SOLN COMPARISON:  None. FINDINGS: Brain: Large area of vasogenic edema in the left hemisphere, slightly decreased. The dense mass at the superior left convexity has been resected. There is a small amount of blood and pneumocephalus over the left hemisphere near the operative site. No nodular contrast enhancement visible at the resection site. Vascular: Remote aneurysm clipping. Skull: Old right pterional craniotomy. New superior left parietal craniotomy. Sinuses/Orbits: Negative Other: None IMPRESSION: 1. Status post resection of superior left convexity mass. Small amount of blood and pneumocephalus over the left hemisphere near the operative site. 2. Slightly decreased vasogenic edema in the left hemisphere. 3. No nodular contrast enhancement at the resection site. Electronically Signed   By: Ulyses Jarred M.D.   On: 08/30/2020 21:58   CT HEAD W & WO CONTRAST  Result Date: 08/29/2020 CLINICAL DATA:  Initial evaluation for brain mass, preoperative planning. EXAM: CT HEAD WITHOUT AND WITH CONTRAST TECHNIQUE: Contiguous axial images were obtained from the base of the skull through the vertex without and with intravenous contrast CONTRAST:  81mL OMNIPAQUE IOHEXOL 300 MG/ML  SOLN COMPARISON:  Prior head CT from 08/23/2020 FINDINGS: Brain: Again seen is a focal intraparenchymal enhancing mass centered along the gray-white matter  differentiation at the left parietal convexity. Lesion measures 3.1 x 1.9 x 3.3 cm (transverse by craniocaudad by AP). Surrounding vasogenic edema throughout the posterior left frontoparietal region without significant regional mass effect. No other mass lesion or abnormal enhancement. Underlying age-related cerebral atrophy with mild chronic small vessel ischemic disease. Remote lacunar infarct present at the right caudate head. 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 Postsurgical encephalomalacia present at the anterior right temporal pole. No acute intracranial hemorrhage. No other acute large vessel territory infarct. No hydrocephalus or extra-axial fluid collection. Vascular: No hyperdense vessel on precontrast images. Normal intravascular enhancement seen following contrast administration. Prior surgical clipping of aneurysm near the right ICA terminus. Skull: Remote right pterional craniotomy. No focal osseous lesions. Scalp soft tissues demonstrate no acute finding. Sinuses/Orbits: Globes and orbital soft tissues within normal limits. Paranasal sinuses are largely clear. Small right mastoid effusion noted, of doubtful significance. Other: None. IMPRESSION: 1. 3.1 x 1.9 x 3.3 cm enhancing mass centered along the gray-white matter differentiation at the left parietal convexity with surrounding vasogenic edema. A solitary intracranial metastasis is favored. A primary CNS neoplasm would be the primary differential consideration. 2. 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  3. 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 Electronically Signed   By: Jeannine Boga M.D.   On: 08/29/2020 00:07    ASSESSMENT AND PLAN: This is a very pleasant 65 years old African-American male recently diagnosed with metastatic non-small cell lung cancer that was initially diagnosed as stage IIIA (Tx, N2, M0) non-small cell lung cancer, adenocarcinoma  presented with large right paratracheal soft tissue mass extending from the right apex to the right suprahilar and precarinal region diagnosed in August 2021.  The patient developed brain metastasis in April 2022. Molecular studies by [employer] showed no actionable mutations. The patient completed a course of concurrent chemoradiation with weekly carboplatin for AUC of 2 and paclitaxel 45 mg/M2.  He is status post 7 cycles.  Last dose was given February 28, 2020 The patient tolerated the previous treatment well and he had partial response. The patient is currently undergoing consolidation treatment with immunotherapy with Imfinzi 1500 mg IV every 4 weeks status post 5 cycles. 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 I had a lengthy discussion with the patient today about his condition and treatment options. I recommended for the patient to discontinue his current treatment with Imfinzi at this point because of lack of benefit. I discussed with the patient repeating CT scan of the chest, abdomen pelvis for restaging of his disease before starting any further systemic therapy. I also discussed with the patient the option of palliative care versus palliative systemic chemotherapy with carboplatin for AUC of 5, Alimta 500 Mg/M2 and Keytruda 200 mg IV every 3 weeks. I discussed with the patient the adverse effect of this treatment including but not limited to alopecia, myelosuppression, nausea and vomiting, peripheral neuropathy, liver or renal dysfunction as well as immunotherapy adverse effects. Is expected to start the first cycle of his treatment in around 7 to 10 days. He will receive vitamin B12 injection today and I will call his pharmacy with prescription for folic acid 1 mg p.o. daily in addition to Compazine 10 mg p.o. every 6 hours as needed for nausea. The patient will come back for follow-up visit 1 week after his treatment for evaluation  and management of any adverse effect of his treatment. He was advised to call immediately if he has any other concerning symptoms in the interval. The patient voices understanding of current disease status and treatment options and is in agreement with the current care plan.  All questions were answered. The patient knows to call the clinic with any problems, questions or concerns. We can certainly see the patient much sooner if necessary.  Disclaimer: This note was dictated with voice recognition software. Similar sounding words can inadvertently be transcribed and may not be corrected upon review.

## 2020-09-27 NOTE — Progress Notes (Signed)
DISCONTINUE ON PATHWAY REGIMEN - Non-Small Cell Lung     A cycle is every 28 days:     Durvalumab   **Always confirm dose/schedule in your pharmacy ordering system**  REASON: Disease Progression PRIOR TREATMENT: [id number]: Durvalumab 1,500 mg q28 Days x up to 12 Months TREATMENT RESPONSE: Progressive Disease (PD)  START ON PATHWAY REGIMEN - Non-Small Cell Lung     A cycle is every 21 days:     Pembrolizumab      Pemetrexed      Carboplatin   **Always confirm dose/schedule in your pharmacy ordering system**  Patient Characteristics: Stage IV Metastatic, Nonsquamous, Molecular Analysis Completed, Molecular Alteration Present and Targeted Therapy Exhausted OR EGFR Exon 20+ or KRAS G12C+ Present and No Prior Chemo/Immunotherapy OR No Alteration Present, Initial  Chemotherapy/Immunotherapy, PS = 0, 1, No Alteration Present, No Alteration Present, Candidate for Immunotherapy, PD-L1 Expression Positive 1-49% (TPS) / Negative / Not Tested / Awaiting Test Results and Immunotherapy Candidate Therapeutic Status: Stage IV Metastatic Histology: Nonsquamous Cell Broad Molecular Profiling Status: Molecular Analysis Completed Molecular Analysis Results: No Alteration Present ECOG Performance Status: 1 Chemotherapy/Immunotherapy Line of Therapy: Initial Chemotherapy/Immunotherapy EGFR Exons 18-21 Mutation Testing Status: Completed and Negative ALK Fusion/Rearrangement Testing Status: Completed and Negative BRAF V600 Mutation Testing Status: Completed and Negative KRAS G12C Mutation Testing Status: Completed and Negative MET Exon 14 Mutation Testing Status: Completed and Negative RET Fusion/Rearrangement Testing Status: Completed and Negative NTRK Fusion/Rearrangement Testing Status: Completed and Negative ROS1 Fusion/Rearrangement Testing Status: Completed and Negative Immunotherapy Candidate Status: Candidate for Immunotherapy PD-L1 Expression Status: Quantity Not Sufficient Intent of  Therapy: Non-Curative / Palliative Intent, Discussed with Patient

## 2020-09-27 NOTE — Progress Notes (Signed)
..  The following Assist/Replace Program for Imfinzi from [employer] has been terminated due to 5/12 per Dr. Julien Nordmann disease progression changing treatment plan.  Last DOS: 07/30/2020.

## 2020-09-28 MED ORDER — PROCHLORPERAZINE MALEATE 10 MG PO TABS
10.0000 mg | ORAL_TABLET | Freq: Four times a day (QID) | ORAL | 0 refills | Status: DC | PRN
  Filled 2020-09-28 – 2020-10-09 (×2): qty 30, 8d supply, fill #0

## 2020-10-01 ENCOUNTER — Other Ambulatory Visit: Payer: Self-pay | Admitting: Pharmacy Technician

## 2020-10-01 ENCOUNTER — Other Ambulatory Visit: Payer: Self-pay

## 2020-10-03 ENCOUNTER — Other Ambulatory Visit: Payer: Self-pay

## 2020-10-03 ENCOUNTER — Ambulatory Visit
Admission: RE | Admit: 2020-10-03 | Discharge: 2020-10-03 | Disposition: A | Discharge status: Home / Self Care | Payer: Medicaid Other | Source: Ambulatory Visit | Attending: Urology | Admitting: Urology

## 2020-10-03 ENCOUNTER — Other Ambulatory Visit: Payer: Self-pay | Admitting: Radiation Therapy

## 2020-10-03 DIAGNOSIS — C7931 Secondary malignant neoplasm of brain: Secondary | ICD-10-CM

## 2020-10-03 NOTE — Progress Notes (Signed)
..  The following Medication: Beryle Flock has been approved thru [employer] as Risk manager. Enrollment period is 10/03/2020 to 10/03/2021.  Assistance ID: [id number].  Reason for Assistance: Self Pay First DOS: 10/08/2020.

## 2020-10-03 NOTE — Progress Notes (Signed)
Radiation Oncology         [phone] ________________________________  Name: [name] [name] MRN: [id number]  Date: 10/03/2020  DOB: 1956-01-17  Post Treatment Note  CC: Grayce Sessions, NP  Burnadette Pop, MD  Diagnosis:   65 y.o. 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   Interval Since Last Radiation:  5 weeks  08/30/20 Pre-Op SRS:  The 3.2 cm target in the left parietal vertex was treated to 18 Gy in a single fraction, followed by surgical resection with Dr. Conchita Paris on 08/31/20.  01/17/20 - 03/02/20: The patient was treated to the disease within the rightlung initially to a dose of 60 Gy using a 92field, IMRTtechnique. The patient then received a cone down boost treatment for an additional 6 Gy. This yielded a final total dose of 66 Gy.  Narrative:  I spoke with the patient and his friend, Gigi Gin, to conduct his routine scheduled 1 month follow up visit via telephone to spare the patient unnecessary potential exposure in the healthcare setting during the current COVID-19 pandemic.  The patient was notified in advance and gave permission to proceed with this visit format.   In summary, he has been under the care of Dr. Arbutus Ped since he was diagnosed with stage IIIA non-small cell lung cancer (adenocarcinoma) in 12/2019. He was treated with concurrent chemoradiation, with daily IMRT under the care of Dr. Mitzi Hansen and weekly carboplatin/paclitaxel (7 cycles). He is now on consolidation treatment with immunotherapy consisting of Imfinzi every 4 weeks, started 04/09/20. His most recent chest CT on 07/02/20 showed mixed response to therapy.  More recently, he contacted Dr. Asa Lente office on 08/23/19 with concerns regarding right upper and lower extremity weakness. He was referred to the ED at that time and underwent non-contrast head CT showing a new 3.1 cm mass lesion in left posterior parietal convexity with large area of  surrounding vasogenic edema and associated mass effect but no intracranial hemorrhage.  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 Due to the aneurysm clip, he was unable to undergo MRI. They proceeded with contrast head CT showing a 3.2 cm intra-axial mass lesion at the parietal vertex on the left with regional vasogenic edema. He met with Dr. Conchita Paris, neurosurgery, and the recommendation was to proceed with pre-op SRS followed by surgical resection of the mass which the patient was in agreement with.  Pre-op SRS was completed on 08/30/20 followed by surgical resection on 08/31/20. He tolerated the pre-op SRS well, without any acute ill side effects.                              On review of systems, the patient states that he is doing well in general. He reports occasional mild headaches that respond to Tylenol as needed.  He also continues with mild fatigue but feels that this is gradually improving.  He denies any nausea, vomiting, dizziness, changes in visual or auditory acuity, tremors or seizure activity.  He has a home health nurse helping to care for him at home and also met with a physical therapist on Friday, 09/28/2020.  He continues with an unsteady gait and weakness in the right lower extremity which he is hoping will improve once he has an established regimen with physical therapy.  Otherwise, he is without complaints.  ALLERGIES:  is allergic to other.  Meds: Current Outpatient Medications  Medication Sig Dispense Refill  . acetaminophen (TYLENOL) 500 MG tablet Take 500 mg by mouth every 6 (six) hours as needed for mild pain or moderate pain.    . budesonide-formoterol (SYMBICORT) 80-4.5 MCG/ACT inhaler Inhale 2 puffs into the lungs 2 (two) times daily. (Patient taking differently: Inhale 2 puffs into the lungs 2 (two) times daily as needed (for flares of wheezing and/or shortness of breath).) 1 Inhaler  11  . dexamethasone (DECADRON) 2 MG tablet Take 1 tablet (2 mg total) by mouth every 12 (twelve) hours. 60 tablet 2  . feeding supplement (ENSURE ENLIVE / ENSURE PLUS) LIQD Take 237 mLs by mouth 2 (two) times daily between meals. 237 mL 12  . levETIRAcetam (KEPPRA) 500 MG tablet Take 1 tablet (500 mg total) by mouth 2 (two) times daily. 60 tablet 2  . Multiple Vitamin (MULTIVITAMIN) tablet Take 1 tablet by mouth daily.    . pantoprazole (PROTONIX) 40 MG tablet Take 1 tablet (40 mg total) by mouth daily with supper. 30 tablet 2  . senna (SENOKOT) 8.6 MG TABS tablet Take 1 tablet (8.6 mg total) by mouth 2 (two) times daily. 120 tablet 0  . SPIRIVA HANDIHALER 18 MCG inhalation capsule Place 1 capsule into inhaler and inhale daily.    . VENTOLIN HFA 108 (90 Base) MCG/ACT inhaler Inhale 2 puffs into the lungs every 4 (four) hours as needed for wheezing or shortness of breath.    . folic acid (FOLVITE) 1 MG tablet Take 1 tablet (1 mg total) by mouth daily. 30 tablet 4  . prochlorperazine (COMPAZINE) 10 MG tablet Take 1 tablet (10 mg total) by mouth every 6 (six) hours as needed for nausea or vomiting. 30 tablet 0   No current facility-administered medications for this encounter.    Physical Findings:  vitals were not taken for this visit.   /Unable to assess due to telephone follow-up visit format.  Lab Findings: Lab Results  Component Value Date   WBC 5.1 09/27/2020   HGB 12.2 (L) 09/27/2020   HCT 38.9 (L) 09/27/2020   MCV 80.9 09/27/2020   PLT 180 09/27/2020     Radiographic Findings: No results found.  Impression/Plan: 1. 65 y.o. 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  He appears to have recovered well from the effects of his recent preop SRS treatment.  He has a scheduled follow-up visit with Dr. Kathyrn Sheriff on 10/08/2020 and will continue in routine follow-up under the care and direction of Dr. Earlie Server for continued  management of his systemic disease.  At the time of his recent visit with Dr. Julien Nordmann on 09/27/2020, given evidence of mixed response to therapy on his recent systemic imaging, the decision was to discontinue the current Imfinzi treatment and change to a combo systemic therapy with carboplatin/Alimta and Keytruda every 3 weeks, scheduled to begin 10/08/2020.  We will continue to follow his progress with plans to repeat a CT head W & WO contrast in approximately 2 months to assess treatment response since the patient is unable to have MRI brain scans.  His imaging will be reviewed in the multidisciplinary brain conference and we will follow-up by phone thereafter to review results and recommendations.  Pending this scan is stable, we discussed that the plan going forward would be to continue with serial CT head W & WO contrast every 3 months to continue to monitor  for any evidence of disease recurrence or progression in the brain.  We will connect by phone following each scan to discuss results and recommendations from the multidisciplinary brain conference where his imaging will be reviewed prior to his follow-up visit.  He and his friend Vickii Chafe appear to have a good understanding of these recommendations and are in agreement with the stated plan.  They know that they are welcome to call at anytime in the interim with any questions or concerns related to his previous radiation.    Nicholos Johns, PA-C

## 2020-10-04 ENCOUNTER — Other Ambulatory Visit: Payer: Self-pay

## 2020-10-04 NOTE — Progress Notes (Signed)
Pharmacist Chemotherapy Monitoring - Initial Assessment    Anticipated start date: 10/08/20   Regimen:  . Are orders appropriate based on the patient's diagnosis, regimen, and cycle? Yes . Does the plan date match the patient's scheduled date? Yes . Is the sequencing of drugs appropriate? Yes . Are the premedications appropriate for the patient's regimen? Yes . Prior Authorization for treatment is: Uninsured o If applicable, is the correct biosimilar selected based on the patient's insurance? not applicable  Organ Function and Labs: Marland Kitchen Are dose adjustments needed based on the patient's renal function, hepatic function, or hematologic function? No . Are appropriate labs ordered prior to the start of patient's treatment? Yes . Other organ system assessment, if indicated: N/A . The following baseline labs, if indicated, have been ordered: pembrolizumab: baseline TSH +/- T4  Dose Assessment: . Are the drug doses appropriate? Yes . Are the following correct: o Drug concentrations Yes o IV fluid compatible with drug Yes o Administration routes Yes o Timing of therapy Yes . If applicable, does the patient have documented access for treatment and/or plans for port-a-cath placement? no . If applicable, have lifetime cumulative doses been properly documented and assessed? yes Lifetime Dose Tracking  . Carboplatin: 1,330 mg = 0.01 % of the maximum lifetime dose of 999,999,999 mg  o Additional premeds added per protocol d/t h/o Carbo exposure at appropriate cycle.  Toxicity Monitoring/Prevention: . The patient has the following take home antiemetics prescribed: Prochlorperazine . The patient has the following take home medications prescribed: B12 for pemetrexed and folic acid for pemetrexed . Medication allergies and previous infusion related reactions, if applicable, have been reviewed and addressed. Yes . The patient's current medication list has been assessed for drug-drug interactions with  their chemotherapy regimen. no significant drug-drug interactions were identified on review.  Order Review: . Are the treatment plan orders signed? Yes . Is the patient scheduled to see a provider prior to their treatment? Yes  I verify that I have reviewed each item in the above checklist and answered each question accordingly.   Kennith Center, Pharm.D., CPP 10/04/2020@11 :46 AM

## 2020-10-04 NOTE — Progress Notes (Signed)
..  The following Medication: Alimta is approved for drug replacement program by [employer]. The enrollment period is from 10/03/2020 to 10/03/2021.  Reason for Assistance: Self Pay. ID: [id number] First DOS:10/08/2020.

## 2020-10-05 ENCOUNTER — Other Ambulatory Visit: Payer: Self-pay | Admitting: Physician Assistant

## 2020-10-05 ENCOUNTER — Other Ambulatory Visit: Payer: Self-pay

## 2020-10-05 DIAGNOSIS — C3411 Malignant neoplasm of upper lobe, right bronchus or lung: Secondary | ICD-10-CM

## 2020-10-05 MED ORDER — SPIRIVA HANDIHALER 18 MCG IN CAPS
ORAL_CAPSULE | RESPIRATORY_TRACT | 1 refills | Status: AC
  Filled 2020-10-05: qty 30, 30d supply, fill #0
  Filled 2020-11-06: qty 30, 30d supply, fill #1

## 2020-10-07 NOTE — Progress Notes (Signed)
  Radiation Oncology         [phone]) [phone] ________________________________  Name: [name] [name] MRN: [id number]  Date: 08/30/2020  DOB: 1955-06-10  End of Treatment Note  Diagnosis:   65 yo man with a solitary 3.2 cm left parietal vertex brain metastasis     Indication for treatment:  Palliative       Radiation treatment dates:   08/30/20  Site/dose/beams/energy:   [name] received stereotactic radiosurgery to the following targets: Left 3.2 cm parietal vertex target was treated using 4 Rapid Arc VMAT Beams to a prescription dose of 18 Gy.  ExacTrac registration was performed for each couch angle.  The 100% isodose line was prescribed.  6 MV X-rays were delivered in the flattening filter free beam mode.  Narrative: The patient tolerated radiation treatment relatively well.     Plan: The patient has completed radiation treatment.  Following this pre-op SRS, he undergoes craniotomy and resection. The patient will return to radiation oncology clinic for routine followup in one month. I advised him to call or return sooner if he has any questions or concerns related to his recovery or treatment. ________________________________  Sheral Apley. Tammi Klippel, M.D.

## 2020-10-08 ENCOUNTER — Telehealth: Payer: Self-pay

## 2020-10-08 ENCOUNTER — Other Ambulatory Visit: Payer: Self-pay

## 2020-10-08 ENCOUNTER — Encounter: Payer: Self-pay | Admitting: Internal Medicine

## 2020-10-08 ENCOUNTER — Inpatient Hospital Stay: Payer: Medicaid Other

## 2020-10-08 ENCOUNTER — Inpatient Hospital Stay (HOSPITAL_BASED_OUTPATIENT_CLINIC_OR_DEPARTMENT_OTHER): Payer: Medicaid Other | Admitting: Internal Medicine

## 2020-10-08 VITALS — BP 123/76 | HR 93 | Temp 97.9°F | Resp 19 | Ht 61.0 in | Wt 129.1 lb

## 2020-10-08 DIAGNOSIS — Z5111 Encounter for antineoplastic chemotherapy: Secondary | ICD-10-CM

## 2020-10-08 DIAGNOSIS — C3411 Malignant neoplasm of upper lobe, right bronchus or lung: Secondary | ICD-10-CM

## 2020-10-08 DIAGNOSIS — Z5112 Encounter for antineoplastic immunotherapy: Secondary | ICD-10-CM

## 2020-10-08 LAB — CBC WITH DIFFERENTIAL (CANCER CENTER ONLY)
Abs Immature Granulocytes: 0.09 10*3/uL — ABNORMAL HIGH (ref 0.00–0.07)
Basophils Absolute: 0 10*3/uL (ref 0.0–0.1)
Basophils Relative: 0 %
Eosinophils Absolute: 0 10*3/uL (ref 0.0–0.5)
Eosinophils Relative: 1 %
HCT: 37.4 % — ABNORMAL LOW (ref 39.0–52.0)
Hemoglobin: 12 g/dL — ABNORMAL LOW (ref 13.0–17.0)
Immature Granulocytes: 2 %
Lymphocytes Relative: 19 %
Lymphs Abs: 0.9 10*3/uL (ref 0.7–4.0)
MCH: 25.5 pg — ABNORMAL LOW (ref 26.0–34.0)
MCHC: 32.1 g/dL (ref 30.0–36.0)
MCV: 79.6 fL — ABNORMAL LOW (ref 80.0–100.0)
Monocytes Absolute: 0.5 10*3/uL (ref 0.1–1.0)
Monocytes Relative: 12 %
Neutro Abs: 3.1 10*3/uL (ref 1.7–7.7)
Neutrophils Relative %: 66 %
Platelet Count: 187 10*3/uL (ref 150–400)
RBC: 4.7 MIL/uL (ref 4.22–5.81)
RDW: 15.5 % (ref 11.5–15.5)
WBC Count: 4.6 10*3/uL (ref 4.0–10.5)
nRBC: 0 % (ref 0.0–0.2)

## 2020-10-08 LAB — CMP (CANCER CENTER ONLY)
ALT: 20 U/L (ref 0–44)
AST: 13 U/L — ABNORMAL LOW (ref 15–41)
Albumin: 3.6 g/dL (ref 3.5–5.0)
Alkaline Phosphatase: 56 U/L (ref 38–126)
Anion gap: 9 (ref 5–15)
BUN: 15 mg/dL (ref 8–23)
CO2: 24 mmol/L (ref 22–32)
Calcium: 9.3 mg/dL (ref 8.9–10.3)
Chloride: 107 mmol/L (ref 98–111)
Creatinine: 0.76 mg/dL (ref 0.61–1.24)
GFR, Estimated: >60 mL/min (ref >60)
Glucose, Bld: 109 mg/dL — ABNORMAL HIGH (ref 70–99)
Potassium: 3.7 mmol/L (ref 3.5–5.1)
Sodium: 140 mmol/L (ref 135–145)
Total Bilirubin: 0.3 mg/dL (ref 0.3–1.2)
Total Protein: 7.2 g/dL (ref 6.5–8.1)

## 2020-10-08 LAB — TSH: TSH: 0.791 u[IU]/mL (ref 0.320–4.118)

## 2020-10-08 MED ORDER — SODIUM CHLORIDE 0.9 % IV SOLN
500.0000 mg/m2 | Freq: Once | INTRAVENOUS | Status: AC
  Administered 2020-10-08: 800 mg via INTRAVENOUS
  Filled 2020-10-08: qty 12

## 2020-10-08 MED ORDER — SODIUM CHLORIDE 0.9 % IV SOLN
522.0000 mg | Freq: Once | INTRAVENOUS | Status: AC
  Administered 2020-10-08: 520 mg via INTRAVENOUS
  Filled 2020-10-08: qty 52

## 2020-10-08 MED ORDER — SODIUM CHLORIDE 0.9 % IV SOLN
150.0000 mg | Freq: Once | INTRAVENOUS | Status: AC
  Administered 2020-10-08: 150 mg via INTRAVENOUS
  Filled 2020-10-08: qty 150

## 2020-10-08 MED ORDER — SODIUM CHLORIDE 0.9 % IV SOLN
10.0000 mg | Freq: Once | INTRAVENOUS | Status: AC
  Administered 2020-10-08: 10 mg via INTRAVENOUS
  Filled 2020-10-08: qty 10

## 2020-10-08 MED ORDER — PALONOSETRON HCL INJECTION 0.25 MG/5ML
0.2500 mg | Freq: Once | INTRAVENOUS | Status: AC
  Administered 2020-10-08: 0.25 mg via INTRAVENOUS

## 2020-10-08 MED ORDER — SODIUM CHLORIDE 0.9 % IV SOLN
200.0000 mg | Freq: Once | INTRAVENOUS | Status: AC
  Administered 2020-10-08: 200 mg via INTRAVENOUS
  Filled 2020-10-08: qty 8

## 2020-10-08 MED ORDER — PALONOSETRON HCL INJECTION 0.25 MG/5ML
INTRAVENOUS | Status: AC
  Filled 2020-10-08: qty 5

## 2020-10-08 MED ORDER — SODIUM CHLORIDE 0.9 % IV SOLN
Freq: Once | INTRAVENOUS | Status: AC
  Filled 2020-10-08: qty 250

## 2020-10-08 NOTE — Telephone Encounter (Signed)
Error

## 2020-10-08 NOTE — Progress Notes (Signed)
...  The following Assist/Replace Program for Alimta from [facility] has been terminated due to Medicaid starting 08/17/2020.  Last DOS: none.

## 2020-10-08 NOTE — Patient Instructions (Signed)
[facility]  Discharge Instructions: Thank you for choosing [facility] to provide your oncology and hematology care.   If you have a lab appointment with the [facility], please go directly to the [facility] and check in at the registration area.   Wear comfortable clothing and clothing appropriate for easy access to any Portacath or PICC line.   We strive to give you quality time with your provider. You may need to reschedule your appointment if you arrive late (15 or more minutes).  Arriving late affects you and other patients whose appointments are after yours.  Also, if you miss three or more appointments without notifying the office, you may be dismissed from the clinic at the provider's discretion.      For prescription refill requests, have your pharmacy contact our office and allow 72 hours for refills to be completed.    Today you received the following chemotherapy and/or immunotherapy agents Keytruda, Alimta and Carboplatin      To help prevent nausea and vomiting after your treatment, we encourage you to take your nausea medication as directed. Take home supply of Compazine as prescribed.   BELOW ARE SYMPTOMS THAT SHOULD BE REPORTED IMMEDIATELY: . *FEVER GREATER THAN 100.4 F (38 C) OR HIGHER . *CHILLS OR SWEATING . *NAUSEA AND VOMITING THAT IS NOT CONTROLLED WITH YOUR NAUSEA MEDICATION . *UNUSUAL SHORTNESS OF BREATH . *UNUSUAL BRUISING OR BLEEDING . *URINARY PROBLEMS (pain or burning when urinating, or frequent urination) . *BOWEL PROBLEMS (unusual diarrhea, constipation, pain near the anus) . TENDERNESS IN MOUTH AND THROAT WITH OR WITHOUT PRESENCE OF ULCERS (sore throat, sores in mouth, or a toothache) . UNUSUAL RASH, SWELLING OR PAIN  . UNUSUAL VAGINAL DISCHARGE OR ITCHING   Items with * indicate a potential emergency and should be followed up as soon as possible or go to the Emergency Department if any problems should  occur.  Please show the CHEMOTHERAPY ALERT CARD or IMMUNOTHERAPY ALERT CARD at check-in to the Emergency Department and triage nurse.  Should you have questions after your visit or need to cancel or reschedule your appointment, please contact [facility]  Dept: [phone]  and follow the prompts.  Office hours are 8:00 a.m. to 4:30 p.m. Monday - Friday. Please note that voicemails left after 4:00 p.m. may not be returned until the following business day.  We are closed weekends and major holidays. You have access to a nurse at all times for urgent questions. Please call the main number to the clinic Dept: [phone] and follow the prompts.   For any non-urgent questions, you may also contact your provider using MyChart. We now offer e-Visits for anyone 15 and older to request care online for non-urgent symptoms. For details visit [email or web address].   Also download the MyChart app! Go to the app store, search "MyChart", open the app, select [facility], and log in with your MyChart username and password.  Due to Covid, a mask is required upon entering the hospital/clinic. If you do not have a mask, one will be given to you upon arrival. For doctor visits, patients may have 1 support person aged 77 or older with them. For treatment visits, patients cannot have anyone with them due to current Covid guidelines and our immunocompromised population.

## 2020-10-08 NOTE — Progress Notes (Signed)
...  The following Assist/Replace Program for [facility] from [facility] has been terminated due to Medicaid starting 08/17/2020.  Last DOS: none.

## 2020-10-08 NOTE — Progress Notes (Signed)
.[place] [phone]   [phone]  OFFICE PROGRESS NOTE  [name], NP [id number] [place] [address]  DIAGNOSIS: Metastatic non-small cell lung cancer initially diagnosed as stage IIIA (Tx, N2, M0) non-small cell lung cancer, adenocarcinoma diagnosed in August 2021 and presented with large right paratracheal soft tissue mass extending from the right apex to the right suprahilar and precarinal region.  Molecular studies by [employer]:  [id number], 31.9%, Olaparib  [id number], 68.6%. None   BRAFAmplification, High (+++) Plasma Copy Number: 5.0  PRIOR THERAPY:  1) Concurrent chemoradiation with weekly carboplatin for AUC of 2 and paclitaxel 45 mg/M2.  First dose January 17, 2020.  Status post 7 cycles.  Last dose was given on February 28, 2020 2) Consolidation treatment with immunotherapy with Imfinzi 1500 mg IV every 4 weeks.  First dose April 09, 2020.  Status post 5 cycles.  Last dose was given July 30, 2020 discontinued secondary to disease progression with brain metastasis. 3) Stereotactic left frontoparietal craniotomy for resection of tumor on 08/30/2020.   CURRENT THERAPY: First-line systemic chemotherapy with carboplatin for AUC of 5, Alimta 500 Mg/M2 and Keytruda 200 Mg IV every 3 weeks.  First dose Oct 08, 2020.   INTERVAL HISTORY: [name] [name] 65 y.o. male returns to the clinic today for follow-up visit.  The patient is feeling fine today with no concerning complaints except for the weakness in the lower extremities.  He is currently undergoing physical therapy.  He denied having any current chest pain, shortness of breath, cough or hemoptysis.  He denied having any fever or chills.  He has no nausea, vomiting, diarrhea or constipation.  He has no headache or visual changes.  He is here today for evaluation before the first cycle of his chemotherapy.  MEDICAL HISTORY: Past Medical History:  Diagnosis  Date  . 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[place]) 12/25/2019  . COPD (chronic obstructive pulmonary disease) ([place]) 11/02/2007   Qualifier: Diagnosis of  By: Melvyn Novas MD, Christena Deem   . Hypertension 12/24/2019  . Iron deficiency anemia 08/23/2007   Qualifier: Diagnosis of  By: Jim Like      ALLERGIES:  is allergic to other.  MEDICATIONS:  Current Outpatient Medications  Medication Sig Dispense Refill  . acetaminophen (TYLENOL) 500 MG tablet Take 500 mg by mouth every 6 (six) hours as needed for mild pain or moderate pain.    . budesonide-formoterol (SYMBICORT) 80-4.5 MCG/ACT inhaler Inhale 2 puffs into the lungs 2 (two) times daily. (Patient taking differently: Inhale 2 puffs into the lungs 2 (two) times daily as needed (for flares of wheezing and/or shortness of breath).) 1 Inhaler 11  . dexamethasone (DECADRON) 2 MG tablet Take 1 tablet (2 mg total) by mouth every 12 (twelve) hours. 60 tablet 2  . feeding supplement (ENSURE ENLIVE / ENSURE PLUS) LIQD Take 237 mLs by mouth 2 (two) times daily between meals. 774 mL 12  . folic acid (FOLVITE) 1 MG tablet Take 1 tablet (1 mg total) by mouth daily. 30 tablet 4  . levETIRAcetam (KEPPRA) 500 MG tablet Take 1 tablet (500 mg total) by mouth 2 (two) times daily. 60 tablet 2  . Multiple Vitamin (MULTIVITAMIN) tablet Take 1 tablet by mouth daily.    . pantoprazole (PROTONIX) 40 MG tablet Take 1 tablet (40 mg total) by mouth daily with supper. 30 tablet 2  . prochlorperazine (COMPAZINE) 10 MG tablet Take 1 tablet (10 mg total) by mouth every  6 (six) hours as needed for nausea or vomiting. 30 tablet 0  . senna (SENOKOT) 8.6 MG TABS tablet Take 1 tablet (8.6 mg total) by mouth 2 (two) times daily. 120 tablet 0  . SPIRIVA HANDIHALER 18 MCG inhalation capsule Place 1 capsule into inhaler and inhale daily.    Marland Kitchen tiotropium (SPIRIVA HANDIHALER) 18 MCG inhalation capsule Place 1 capsule into inhaler and inhale daily. 30 capsule 1  . VENTOLIN HFA 108 (90 Base) MCG/ACT inhaler  Inhale 2 puffs into the lungs every 4 (four) hours as needed for wheezing or shortness of breath.     No current facility-administered medications for this visit.    SURGICAL HISTORY:  Past Surgical History:  Procedure Laterality Date  . APPLICATION OF CRANIAL NAVIGATION N/A 08/30/2020   Procedure: APPLICATION OF CRANIAL NAVIGATION;  Surgeon: Consuella Lose, MD;  Location: [facility];  Service: Neurosurgery;  Laterality: N/A;  . CRANIOTOMY Left 08/30/2020   Procedure: Stereotactic left frontoparietal craniectomy for resection of tumor with brainlab;  Surgeon: Consuella Lose, MD;  Location: [facility];  Service: Neurosurgery;  Laterality: Left;  . ENDOBRONCHIAL ULTRASOUND N/A 12/27/2019   Procedure: ENDOBRONCHIAL ULTRASOUND;  Surgeon: Laurin Coder, MD;  Location: [facility];  Service: Endoscopy;  Laterality: N/A;  . FINE NEEDLE ASPIRATION  12/27/2019   Procedure: FINE NEEDLE ASPIRATION;  Surgeon: Laurin Coder, MD;  Location: [facility];  Service: Endoscopy;;  . VIDEO BRONCHOSCOPY N/A 12/27/2019   Procedure: VIDEO BRONCHOSCOPY WITHOUT FLUORO;  Surgeon: Laurin Coder, MD;  Location: [facility];  Service: Endoscopy;  Laterality: N/A;    REVIEW OF SYSTEMS:  A comprehensive review of systems was negative except for: Constitutional: positive for fatigue Musculoskeletal: positive for muscle weakness   PHYSICAL EXAMINATION: General appearance: alert, cooperative, fatigued and no distress Head: Normocephalic, without obvious abnormality, atraumatic Neck: no adenopathy, no JVD, supple, symmetrical, trachea midline and thyroid not enlarged, symmetric, no tenderness/mass/nodules Lymph nodes: Cervical, supraclavicular, and axillary nodes normal. Resp: clear to auscultation bilaterally Back: symmetric, no curvature. ROM normal. No CVA tenderness. Cardio: regular rate and rhythm, S1, S2 normal, no murmur, click, rub or gallop GI: soft, non-tender; bowel sounds normal; no masses,  no  organomegaly Extremities: extremities normal, atraumatic, no cyanosis or edema  ECOG PERFORMANCE STATUS: 1 - Symptomatic but completely ambulatory  Blood pressure 123/76, pulse 93, temperature 97.9 F (36.6 C), temperature source Tympanic, resp. rate 19, height 5\' 1"  (1.549 m), weight 129 lb 1.6 oz (58.6 kg), SpO2 99 %.  LABORATORY DATA: Lab Results  Component Value Date   WBC 4.6 10/08/2020   HGB 12.0 (L) 10/08/2020   HCT 37.4 (L) 10/08/2020   MCV 79.6 (L) 10/08/2020   PLT 187 10/08/2020      Chemistry      Component Value Date/Time   NA 140 10/08/2020 0917   NA 144 08/03/2019 1527   K 3.7 10/08/2020 0917   CL 107 10/08/2020 0917   CO2 24 10/08/2020 0917   BUN 15 10/08/2020 0917   BUN 9 08/03/2019 1527   CREATININE 0.76 10/08/2020 0917      Component Value Date/Time   CALCIUM 9.3 10/08/2020 0917   ALKPHOS 56 10/08/2020 0917   AST 13 (L) 10/08/2020 0917   ALT 20 10/08/2020 0917   BILITOT 0.3 10/08/2020 0917       RADIOGRAPHIC STUDIES: No results found.  ASSESSMENT AND PLAN: This is a very pleasant 65 years old African-American male recently diagnosed with metastatic non-small cell lung cancer that was  initially diagnosed as stage IIIA (Tx, N2, M0) non-small cell lung cancer, adenocarcinoma presented with large right paratracheal soft tissue mass extending from the right apex to the right suprahilar and precarinal region diagnosed in August 2021.  The patient developed brain metastasis in April 2022. Molecular studies by [facility] [employer] showed no actionable mutations. The patient completed a course of concurrent chemoradiation with weekly carboplatin for AUC of 2 and paclitaxel 45 mg/M2.  He is status post 7 cycles.  Last dose was given February 28, 2020 The patient tolerated the previous treatment well and he had partial response. The patient is currently undergoing consolidation treatment with immunotherapy with Imfinzi 1500 mg IV every 4 weeks status post 5 cycles. He  was recently found to have solitary brain metastasis which was surgically resected with SRS. The patient is here today to start the first cycle of systemic chemotherapy with carboplatin for AUC of 5, Alimta 500 Mg/M2 and Keytruda 200 Mg IV every 3 weeks. He missed the appointment for the scan last week.  I recommended for the patient to call radiology to reschedule his scan. I will see him back for follow-up visit and 3 weeks for evaluation before starting cycle #2. The patient was advised to call immediately if he has any other concerning symptoms in the interval The patient voices understanding of current disease status and treatment options and is in agreement with the current care plan.  All questions were answered. The patient knows to call the clinic with any problems, questions or concerns. We can certainly see the patient much sooner if necessary.  Disclaimer: This note was dictated with voice recognition software. Similar sounding words can inadvertently be transcribed and may not be corrected upon review.

## 2020-10-09 ENCOUNTER — Telehealth: Payer: Self-pay | Admitting: Medical Oncology

## 2020-10-09 ENCOUNTER — Other Ambulatory Visit: Payer: Self-pay

## 2020-10-09 ENCOUNTER — Encounter: Payer: Self-pay | Admitting: Internal Medicine

## 2020-10-09 NOTE — Telephone Encounter (Signed)
Message sent to [place] was unable to schedule CT because schedulers did not know if pt needed with or without contrast. I told schedulers with contrast and to contact [name].

## 2020-10-11 ENCOUNTER — Encounter (HOSPITAL_COMMUNITY): Payer: Self-pay

## 2020-10-11 ENCOUNTER — Other Ambulatory Visit: Payer: Self-pay

## 2020-10-11 ENCOUNTER — Ambulatory Visit (HOSPITAL_COMMUNITY)
Admission: RE | Admit: 2020-10-11 | Discharge: 2020-10-11 | Disposition: A | Discharge status: Home / Self Care | Payer: Medicaid Other | Source: Ambulatory Visit | Attending: Internal Medicine | Admitting: Internal Medicine

## 2020-10-11 DIAGNOSIS — C3490 Malignant neoplasm of unspecified part of unspecified bronchus or lung: Secondary | ICD-10-CM | POA: Insufficient documentation

## 2020-10-11 HISTORY — DX: Malignant (primary) neoplasm, unspecified: C80.1

## 2020-10-11 MED ORDER — IOHEXOL 300 MG/ML  SOLN
100.0000 mL | Freq: Once | INTRAMUSCULAR | Status: AC | PRN
  Administered 2020-10-11: 100 mL via INTRAVENOUS

## 2020-10-11 MED ORDER — SODIUM CHLORIDE (PF) 0.9 % IJ SOLN
INTRAMUSCULAR | Status: AC
  Filled 2020-10-11: qty 50

## 2020-10-16 ENCOUNTER — Other Ambulatory Visit: Payer: Self-pay

## 2020-10-16 ENCOUNTER — Inpatient Hospital Stay: Payer: Medicaid Other

## 2020-10-16 ENCOUNTER — Telehealth: Payer: Self-pay | Admitting: Medical Oncology

## 2020-10-16 DIAGNOSIS — Z5112 Encounter for antineoplastic immunotherapy: Secondary | ICD-10-CM | POA: Diagnosis not present

## 2020-10-16 DIAGNOSIS — C3411 Malignant neoplasm of upper lobe, right bronchus or lung: Secondary | ICD-10-CM

## 2020-10-16 LAB — CBC WITH DIFFERENTIAL (CANCER CENTER ONLY)
Abs Immature Granulocytes: 0.01 10*3/uL (ref 0.00–0.07)
Basophils Absolute: 0 10*3/uL (ref 0.0–0.1)
Basophils Relative: 1 %
Eosinophils Absolute: 0 10*3/uL (ref 0.0–0.5)
Eosinophils Relative: 3 %
HCT: 33.2 % — ABNORMAL LOW (ref 39.0–52.0)
Hemoglobin: 10.8 g/dL — ABNORMAL LOW (ref 13.0–17.0)
Immature Granulocytes: 1 %
Lymphocytes Relative: 48 %
Lymphs Abs: 0.7 10*3/uL (ref 0.7–4.0)
MCH: 25.5 pg — ABNORMAL LOW (ref 26.0–34.0)
MCHC: 32.5 g/dL (ref 30.0–36.0)
MCV: 78.5 fL — ABNORMAL LOW (ref 80.0–100.0)
Monocytes Absolute: 0.3 10*3/uL (ref 0.1–1.0)
Monocytes Relative: 20 %
Neutro Abs: 0.4 10*3/uL — CL (ref 1.7–7.7)
Neutrophils Relative %: 27 %
Platelet Count: 135 10*3/uL — ABNORMAL LOW (ref 150–400)
RBC: 4.23 MIL/uL (ref 4.22–5.81)
RDW: 14.5 % (ref 11.5–15.5)
WBC Count: 1.3 10*3/uL — ABNORMAL LOW (ref 4.0–10.5)
nRBC: 0 % (ref 0.0–0.2)

## 2020-10-16 LAB — TSH: TSH: 3.193 u[IU]/mL (ref 0.320–4.118)

## 2020-10-16 LAB — CMP (CANCER CENTER ONLY)
ALT: 21 U/L (ref 0–44)
AST: 17 U/L (ref 15–41)
Albumin: 3.4 g/dL — ABNORMAL LOW (ref 3.5–5.0)
Alkaline Phosphatase: 55 U/L (ref 38–126)
Anion gap: 11 (ref 5–15)
BUN: 11 mg/dL (ref 8–23)
CO2: 25 mmol/L (ref 22–32)
Calcium: 9.2 mg/dL (ref 8.9–10.3)
Chloride: 104 mmol/L (ref 98–111)
Creatinine: 0.76 mg/dL (ref 0.61–1.24)
GFR, Estimated: >60 mL/min (ref >60)
Glucose, Bld: 80 mg/dL (ref 70–99)
Potassium: 3.7 mmol/L (ref 3.5–5.1)
Sodium: 140 mmol/L (ref 135–145)
Total Bilirubin: 0.3 mg/dL (ref 0.3–1.2)
Total Protein: 6.9 g/dL (ref 6.5–8.1)

## 2020-10-16 NOTE — Telephone Encounter (Signed)
CRITICAL VALUE STICKER  CRITICAL VALUE:ANC=0.4   RECEIVER (on-site recipient of call):Geneal Huebert  DATE & TIME NOTIFIED: 10/16/20 1023  MESSENGER (representative from lab):Pam  MD NOTIFIED: Julien Nordmann TIME OF NOTIFICATION:1030  RESPONSE: ":watch pt for now". I instructed [name] on neutropenic precautions and when to call provider.

## 2020-10-17 ENCOUNTER — Other Ambulatory Visit (HOSPITAL_BASED_OUTPATIENT_CLINIC_OR_DEPARTMENT_OTHER): Payer: Self-pay

## 2020-10-17 ENCOUNTER — Other Ambulatory Visit: Payer: Self-pay | Admitting: Radiation Therapy

## 2020-10-17 ENCOUNTER — Encounter: Payer: Self-pay | Admitting: Internal Medicine

## 2020-10-17 DIAGNOSIS — C7931 Secondary malignant neoplasm of brain: Secondary | ICD-10-CM

## 2020-10-17 MED ORDER — PREDNISONE 10 MG PO TABS
ORAL_TABLET | ORAL | 0 refills | Status: DC
  Filled 2020-10-17 – 2020-10-18 (×2): qty 21, 6d supply, fill #0

## 2020-10-18 ENCOUNTER — Other Ambulatory Visit (HOSPITAL_BASED_OUTPATIENT_CLINIC_OR_DEPARTMENT_OTHER): Payer: Self-pay

## 2020-10-18 ENCOUNTER — Other Ambulatory Visit: Payer: Self-pay

## 2020-10-19 ENCOUNTER — Other Ambulatory Visit: Payer: Self-pay

## 2020-10-22 ENCOUNTER — Other Ambulatory Visit: Payer: Self-pay

## 2020-10-22 ENCOUNTER — Inpatient Hospital Stay: Payer: Medicaid Other | Attending: Internal Medicine

## 2020-10-22 DIAGNOSIS — C7931 Secondary malignant neoplasm of brain: Secondary | ICD-10-CM | POA: Insufficient documentation

## 2020-10-22 DIAGNOSIS — C3491 Malignant neoplasm of unspecified part of right bronchus or lung: Secondary | ICD-10-CM | POA: Diagnosis present

## 2020-10-22 DIAGNOSIS — Z5111 Encounter for antineoplastic chemotherapy: Secondary | ICD-10-CM | POA: Insufficient documentation

## 2020-10-22 DIAGNOSIS — Z5112 Encounter for antineoplastic immunotherapy: Secondary | ICD-10-CM | POA: Diagnosis present

## 2020-10-22 DIAGNOSIS — D509 Iron deficiency anemia, unspecified: Secondary | ICD-10-CM | POA: Diagnosis not present

## 2020-10-22 DIAGNOSIS — Z79899 Other long term (current) drug therapy: Secondary | ICD-10-CM | POA: Insufficient documentation

## 2020-10-22 DIAGNOSIS — C3411 Malignant neoplasm of upper lobe, right bronchus or lung: Secondary | ICD-10-CM

## 2020-10-22 LAB — CBC WITH DIFFERENTIAL (CANCER CENTER ONLY)
Abs Immature Granulocytes: 0.27 10*3/uL — ABNORMAL HIGH (ref 0.00–0.07)
Basophils Absolute: 0 10*3/uL (ref 0.0–0.1)
Basophils Relative: 1 %
Eosinophils Absolute: 0 10*3/uL (ref 0.0–0.5)
Eosinophils Relative: 0 %
HCT: 34.2 % — ABNORMAL LOW (ref 39.0–52.0)
Hemoglobin: 10.9 g/dL — ABNORMAL LOW (ref 13.0–17.0)
Immature Granulocytes: 7 %
Lymphocytes Relative: 21 %
Lymphs Abs: 0.9 10*3/uL (ref 0.7–4.0)
MCH: 25.7 pg — ABNORMAL LOW (ref 26.0–34.0)
MCHC: 31.9 g/dL (ref 30.0–36.0)
MCV: 80.7 fL (ref 80.0–100.0)
Monocytes Absolute: 0.5 10*3/uL (ref 0.1–1.0)
Monocytes Relative: 11 %
Neutro Abs: 2.5 10*3/uL (ref 1.7–7.7)
Neutrophils Relative %: 60 %
Platelet Count: 167 10*3/uL (ref 150–400)
RBC: 4.24 MIL/uL (ref 4.22–5.81)
RDW: 15.2 % (ref 11.5–15.5)
WBC Count: 4.1 10*3/uL (ref 4.0–10.5)
nRBC: 0 % (ref 0.0–0.2)

## 2020-10-22 LAB — CMP (CANCER CENTER ONLY)
ALT: 18 U/L (ref 0–44)
AST: 16 U/L (ref 15–41)
Albumin: 3.5 g/dL (ref 3.5–5.0)
Alkaline Phosphatase: 51 U/L (ref 38–126)
Anion gap: 12 (ref 5–15)
BUN: 16 mg/dL (ref 8–23)
CO2: 24 mmol/L (ref 22–32)
Calcium: 9.6 mg/dL (ref 8.9–10.3)
Chloride: 105 mmol/L (ref 98–111)
Creatinine: 0.78 mg/dL (ref 0.61–1.24)
GFR, Estimated: >60 mL/min (ref >60)
Glucose, Bld: 84 mg/dL (ref 70–99)
Potassium: 4.1 mmol/L (ref 3.5–5.1)
Sodium: 141 mmol/L (ref 135–145)
Total Bilirubin: <0.2 mg/dL — ABNORMAL LOW (ref 0.3–1.2)
Total Protein: 7 g/dL (ref 6.5–8.1)

## 2020-10-23 ENCOUNTER — Other Ambulatory Visit: Payer: Self-pay

## 2020-10-25 ENCOUNTER — Encounter: Payer: Self-pay | Admitting: Internal Medicine

## 2020-10-26 NOTE — Progress Notes (Signed)
[facility] OFFICE PROGRESS NOTE  [name], NP [address] [place] [id number]  DIAGNOSIS: Metastatic non-small cell lung cancer initially diagnosed as stage IIIA (Tx, N2, M0) non-small cell lung cancer, adenocarcinoma diagnosed in August 2021 and presented with large right paratracheal soft tissue mass extending from the right apex to the right suprahilar and precarinal region.   Molecular studies by [employer]:   [id number], 31.9%, Olaparib   [id number], 68.6%. None        BRAFAmplification, High (+++) Plasma Copy Number: 5.0  PRIOR THERAPY: 1) Concurrent chemoradiation with weekly carboplatin for AUC of 2 and paclitaxel 45 mg/M2.  First dose January 17, 2020.  Status post 7 cycles.  Last dose was given on February 28, 2020 2) Consolidation treatment with immunotherapy with Imfinzi 1500 mg IV every 4 weeks.  First dose April 09, 2020.  Status post 5 cycles.  Last dose was given July 30, 2020 discontinued secondary to disease progression with brain metastasis. 3) Stereotactic left frontoparietal craniotomy for resection of tumor on 08/30/2020.  CURRENT THERAPY: First-line systemic chemotherapy with carboplatin for AUC of 5, Alimta 500 Mg/M2 and Keytruda 200 Mg IV every 3 weeks.  First dose Oct 08, 2020. Status post 1 cycle   INTERVAL HISTORY: [name] [name] 65 y.o. male returns to the clinic today for a follow-up visit.  The patient was recently found to have metastatic disease to the brain for which he completed radiation and resection of the tumor in April 2022.  For his systemic treatment, the patient was then changed to systemic chemotherapy with carboplatin, Alimta, and Keytruda.  He is status post 1 cycle and tolerated it well except he did experience some neutropenia after cycle #1 which recovered. He still has some weakness in his lower extremities and is using a walker. He underwent physical therapy.  He denies any signs or symptoms of  infection including fever, chills, or night sweats. Denies any weight loss.  He denies any chest pain, hemoptysis, or cough. He reports baseline dyspnea on exertion.  He denies any diarrhea, constipation, nausea, or vomiting.  Denies any rashes or skin changes.  He denies any headache or visual changes. The patient had a restaging CT scan performed recently to establish a new baseline prior to starting his new systemic treatment.  The patient is here today for evaluation before starting cycle #2.  MEDICAL HISTORY: Past Medical History:  Diagnosis Date   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[place]) 12/25/2019   Cancer ([place])    COPD (chronic obstructive pulmonary disease) ([place]) 11/02/2007   Qualifier: Diagnosis of  By: Melvyn Novas MD, Christena Deem    Hypertension 12/24/2019   Iron deficiency anemia 08/23/2007   Qualifier: Diagnosis of  By: Jim Like      ALLERGIES:  is allergic to other.  MEDICATIONS:  Current Outpatient Medications  Medication Sig Dispense Refill   acetaminophen (TYLENOL) 500 MG tablet Take 500 mg by mouth every 6 (six) hours as needed for mild pain or moderate pain.     budesonide-formoterol (SYMBICORT) 80-4.5 MCG/ACT inhaler Inhale 2 puffs into the lungs 2 (two) times daily. (Patient taking differently: Inhale 2 puffs into the lungs 2 (two) times daily as needed (for flares of wheezing and/or shortness of breath).) 1 Inhaler 11   dexamethasone (DECADRON) 2 MG tablet Take 1 tablet (2 mg total) by mouth every 12 (twelve) hours. 60 tablet 2   feeding supplement (ENSURE ENLIVE / ENSURE PLUS) LIQD Take 237 mLs by mouth 2 (two) times  daily between meals. 295 mL 12   folic acid (FOLVITE) 1 MG tablet Take 1 tablet (1 mg total) by mouth daily. 30 tablet 4   levETIRAcetam (KEPPRA) 500 MG tablet Take 1 tablet (500 mg total) by mouth 2 (two) times daily. 60 tablet 2   Multiple Vitamin (MULTIVITAMIN) tablet Take 1 tablet by mouth daily.     pantoprazole (PROTONIX) 40 MG tablet Take 1 tablet (40 mg total) by  mouth daily with supper. 30 tablet 2   predniSONE (DELTASONE) 10 MG tablet See attached directions. 21 tablet 0   prochlorperazine (COMPAZINE) 10 MG tablet Take 1 tablet (10 mg total) by mouth every 6 (six) hours as needed for nausea or vomiting. (Patient not taking: Reported on 10/08/2020) 30 tablet 0   senna (SENOKOT) 8.6 MG TABS tablet Take 1 tablet (8.6 mg total) by mouth 2 (two) times daily. 120 tablet 0   SPIRIVA HANDIHALER 18 MCG inhalation capsule Place 1 capsule into inhaler and inhale daily.     tiotropium (SPIRIVA HANDIHALER) 18 MCG inhalation capsule Place 1 capsule into inhaler and inhale daily. 30 capsule 1   VENTOLIN HFA 108 (90 Base) MCG/ACT inhaler Inhale 2 puffs into the lungs every 4 (four) hours as needed for wheezing or shortness of breath.     No current facility-administered medications for this visit.    SURGICAL HISTORY:  Past Surgical History:  Procedure Laterality Date   APPLICATION OF CRANIAL NAVIGATION N/A 08/30/2020   Procedure: APPLICATION OF CRANIAL NAVIGATION;  Surgeon: Consuella Lose, MD;  Location: [facility];  Service: Neurosurgery;  Laterality: N/A;   CRANIOTOMY Left 08/30/2020   Procedure: Stereotactic left frontoparietal craniectomy for resection of tumor with brainlab;  Surgeon: Consuella Lose, MD;  Location: [facility];  Service: Neurosurgery;  Laterality: Left;   ENDOBRONCHIAL ULTRASOUND N/A 12/27/2019   Procedure: ENDOBRONCHIAL ULTRASOUND;  Surgeon: Laurin Coder, MD;  Location: [facility];  Service: Endoscopy;  Laterality: N/A;   FINE NEEDLE ASPIRATION  12/27/2019   Procedure: FINE NEEDLE ASPIRATION;  Surgeon: Laurin Coder, MD;  Location: [facility];  Service: Endoscopy;;   VIDEO BRONCHOSCOPY N/A 12/27/2019   Procedure: VIDEO BRONCHOSCOPY WITHOUT FLUORO;  Surgeon: Laurin Coder, MD;  Location: [facility];  Service: Endoscopy;  Laterality: N/A;    REVIEW OF SYSTEMS:   Review of Systems  Constitutional: Positive for fatigue. Negative  for appetite change, chills, fever and unexpected weight change.  HENT:   Negative for mouth sores, nosebleeds, sore throat and trouble swallowing.   Eyes: Negative for eye problems and icterus.  Respiratory: Positive for shortness of breath with exertion. Negative for cough, hemoptysis, and wheezing.   Cardiovascular: Negative for chest pain and leg swelling.  Gastrointestinal: Negative for abdominal pain, constipation, diarrhea, nausea and vomiting.  Genitourinary: Negative for bladder incontinence, difficulty urinating, dysuria, frequency and hematuria.   Musculoskeletal: Negative for back pain, gait problem, neck pain and neck stiffness.  Skin: Negative for itching and rash.  Neurological: Positive for lower extremity weakness. Negative for dizziness, extremity weakness, gait problem, headaches, light-headedness and seizures.  Hematological: Negative for adenopathy. Does not bruise/bleed easily.  Psychiatric/Behavioral: Negative for confusion, depression and sleep disturbance. The patient is not nervous/anxious.     PHYSICAL EXAMINATION:  Blood pressure 136/84, pulse 83, temperature 97.6 F (36.4 C), temperature source Oral, resp. rate 18, weight 131 lb 14.4 oz (59.8 kg), SpO2 98 %.  ECOG PERFORMANCE STATUS: 1  Physical Exam  Constitutional: Oriented to person, place, and time and well-developed, well-nourished,  and in no distress.  HENT:  Head: Normocephalic and atraumatic.  Mouth/Throat: Oropharynx is clear and moist. No oropharyngeal exudate.  Eyes: Conjunctivae are normal. Right eye exhibits no discharge. Left eye exhibits no discharge. No scleral icterus.  Neck: Normal range of motion. Neck supple.  Cardiovascular: Normal rate, regular rhythm, normal heart sounds and intact distal pulses.   Pulmonary/Chest: Effort normal. Quiet breath sounds in all lung fields. No respiratory distress. No wheezes. No rales.  Abdominal: Soft. Bowel sounds are normal. Exhibits no distension and no  mass. There is no tenderness.  Musculoskeletal: Normal range of motion. Exhibits no edema.  Lymphadenopathy:    No cervical adenopathy.  Neurological: Alert and oriented to person, place, and time. Exhibits normal muscle tone. Ambulates with a walker.  Skin: Skin is warm and dry. No rash noted. Not diaphoretic. No erythema. No pallor.  Psychiatric: Mood, memory and judgment normal.  Vitals reviewed.  LABORATORY DATA: Lab Results  Component Value Date   WBC 7.2 10/29/2020   HGB 11.0 (L) 10/29/2020   HCT 33.9 (L) 10/29/2020   MCV 79.4 (L) 10/29/2020   PLT 257 10/29/2020      Chemistry      Component Value Date/Time   NA 140 10/29/2020 0804   NA 144 08/03/2019 1527   K 4.1 10/29/2020 0804   CL 105 10/29/2020 0804   CO2 25 10/29/2020 0804   BUN 11 10/29/2020 0804   BUN 9 08/03/2019 1527   CREATININE 0.77 10/29/2020 0804      Component Value Date/Time   CALCIUM 9.5 10/29/2020 0804   ALKPHOS 49 10/29/2020 0804   AST 15 10/29/2020 0804   ALT 19 10/29/2020 0804   BILITOT 0.2 (L) 10/29/2020 0804       RADIOGRAPHIC STUDIES:  CT Chest W Contrast  Result Date: 10/12/2020 CLINICAL DATA:  Primary Cancer Type: Lung Imaging Indication: Assess response to therapy Interval therapy since last imaging? Yes Initial Cancer Diagnosis Date: 12/27/2019; Established by: Biopsy-proven Detailed Pathology: Initial diagnosis stage IIIA non-small cell lung cancer, adenocarcinoma. Primary Tumor location: Paratracheal soft tissue mass extending from the right apex to the right suprahilar and precarinal region. Brain metastasis. Surgeries: No thoracic. 08/30/2020 stereotactic left frontoparietal craniotomy for resection of tumor. Chemotherapy: Yes; Ongoing? Yes; Most recent administration: 10/08/2020 Immunotherapy?  Yes; Type: Keytruda; Ongoing? Yes Radiation therapy? Yes; Date Range: 01/17/2019 - 03/02/2020; Target: Right lung EXAM: CT CHEST, ABDOMEN, AND PELVIS WITH CONTRAST TECHNIQUE: Multidetector CT  imaging of the chest, abdomen and pelvis was performed following the standard protocol during bolus administration of intravenous contrast. CONTRAST:  170mL OMNIPAQUE IOHEXOL 300 MG/ML SOLN, additional oral enteric contrast COMPARISON:  Most recent CT chest 07/02/2020.  01/11/2020 PET-CT. FINDINGS: CT CHEST FINDINGS Cardiovascular: No significant vascular findings. Normal heart size. No pericardial effusion. Mediastinum/Nodes: Interval reduction in size of small prominent pretracheal and AP window lymph nodes, AP window node measuring 0.7 x 0.4 cm, previously 1.2 x 0.8 cm (series 2, image 17). There is additionally reduced soft tissue stranding about the trachea adjacent to an abutting mass (series 2, image 12). Thyroid gland, trachea, and esophagus demonstrate no significant findings. Lungs/Pleura: Slight interval decrease in size of a soft tissue mass of the medial right pulmonary apex and adjacent posterior and middle mediastinum, measuring approximately 3.0 x 2.7 cm, previously 3.2 x 3.0 cm when measured similarly (series 2, image 10). Significant interval increase in size of multiple bilateral pulmonary nodules, largest index nodule in the left lower lobe measuring 2.5 x 2.4  cm, previously 0.7 x 0.7 cm (series 4, image 82). New nodule of the superior left lower lobe measuring 0.8 x 0.7 cm (series 4, image 48). Redemonstrated cavitary scarring and fibrosis of the left apex (series 4, image 30). No pleural effusion or pneumothorax. Musculoskeletal: No chest wall mass or suspicious bone lesions identified. CT ABDOMEN PELVIS FINDINGS Hepatobiliary: No solid liver abnormality is seen. No gallstones, gallbladder wall thickening, or biliary dilatation. Pancreas: Unremarkable. No pancreatic ductal dilatation or surrounding inflammatory changes. Spleen: Normal in size without significant abnormality. Adrenals/Urinary Tract: Adrenal glands are unremarkable. Kidneys are normal, without renal calculi, solid lesion, or  hydronephrosis. Thickening of the urinary bladder wall, likely related chronic outlet obstruction. Stomach/Bowel: Stomach is within normal limits. Appendix appears normal. No evidence of bowel wall thickening, distention, or inflammatory changes. Vascular/Lymphatic: Aortic atherosclerosis. No enlarged abdominal or pelvic lymph nodes. Reproductive: Prostatomegaly with median lobe hypertrophy. Other: No abdominal wall hernia or abnormality. No abdominopelvic ascites. Musculoskeletal: No acute or significant osseous findings. 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 IMPRESSION: 1. Slight interval decrease in size of a soft tissue mass of the medial right pulmonary apex and adjacent posterior and middle mediastinum. There is additionally reduced soft tissue stranding about the trachea adjacent to the abutting the mass. 2. Interval reduction in size of small prominent pretracheal and AP window lymph nodes 3. Significant interval increase in size of multiple bilateral pulmonary nodules, largest index nodule in the left lower lobe measuring 2.5 x 2.4 cm, previously 0.7 x 0.7 cm. New nodule of the superior left lower lobe measuring 0.8 x 0.7 cm. Findings are consistent with worsened pulmonary metastatic disease. 4. Overall constellation of findings is consistent with mixed response to treatment. 5. No evidence of metastatic disease within the abdomen or pelvis. 6. Prostatomegaly. Aortic Atherosclerosis (ICD10-I70.0). Electronically Signed   By: Eddie Candle M.D.   On: 10/12/2020 10:53   CT Abdomen Pelvis W Contrast  Result Date: 10/12/2020 CLINICAL DATA:  Primary Cancer Type: Lung Imaging Indication: Assess response to therapy Interval therapy since last imaging? Yes Initial Cancer Diagnosis Date: 12/27/2019; Established by: Biopsy-proven Detailed Pathology: Initial diagnosis stage IIIA non-small cell lung cancer, adenocarcinoma. Primary Tumor location: Paratracheal soft tissue mass  extending from the right apex to the right suprahilar and precarinal region. Brain metastasis. Surgeries: No thoracic. 08/30/2020 stereotactic left frontoparietal craniotomy for resection of tumor. Chemotherapy: Yes; Ongoing? Yes; Most recent administration: 10/08/2020 Immunotherapy?  Yes; Type: Keytruda; Ongoing? Yes Radiation therapy? Yes; Date Range: 01/17/2019 - 03/02/2020; Target: Right lung EXAM: CT CHEST, ABDOMEN, AND PELVIS WITH CONTRAST TECHNIQUE: Multidetector CT imaging of the chest, abdomen and pelvis was performed following the standard protocol during bolus administration of intravenous contrast. CONTRAST:  19mL OMNIPAQUE IOHEXOL 300 MG/ML SOLN, additional oral enteric contrast COMPARISON:  Most recent CT chest 07/02/2020.  01/11/2020 PET-CT. FINDINGS: CT CHEST FINDINGS Cardiovascular: No significant vascular findings. Normal heart size. No pericardial effusion. Mediastinum/Nodes: Interval reduction in size of small prominent pretracheal and AP window lymph nodes, AP window node measuring 0.7 x 0.4 cm, previously 1.2 x 0.8 cm (series 2, image 17). There is additionally reduced soft tissue stranding about the trachea adjacent to an abutting mass (series 2, image 12). Thyroid gland, trachea, and esophagus demonstrate no significant findings. Lungs/Pleura: Slight interval decrease in size of a soft tissue mass of the medial right pulmonary apex and adjacent posterior and middle mediastinum, measuring approximately 3.0 x 2.7 cm, previously 3.2 x 3.0 cm when measured  similarly (series 2, image 10). Significant interval increase in size of multiple bilateral pulmonary nodules, largest index nodule in the left lower lobe measuring 2.5 x 2.4 cm, previously 0.7 x 0.7 cm (series 4, image 82). New nodule of the superior left lower lobe measuring 0.8 x 0.7 cm (series 4, image 48). Redemonstrated cavitary scarring and fibrosis of the left apex (series 4, image 30). No pleural effusion or pneumothorax.  Musculoskeletal: No chest wall mass or suspicious bone lesions identified. CT ABDOMEN PELVIS FINDINGS Hepatobiliary: No solid liver abnormality is seen. No gallstones, gallbladder wall thickening, or biliary dilatation. Pancreas: Unremarkable. No pancreatic ductal dilatation or surrounding inflammatory changes. Spleen: Normal in size without significant abnormality. Adrenals/Urinary Tract: Adrenal glands are unremarkable. Kidneys are normal, without renal calculi, solid lesion, or hydronephrosis. Thickening of the urinary bladder wall, likely related chronic outlet obstruction. Stomach/Bowel: Stomach is within normal limits. Appendix appears normal. No evidence of bowel wall thickening, distention, or inflammatory changes. Vascular/Lymphatic: Aortic atherosclerosis. No enlarged abdominal or pelvic lymph nodes. Reproductive: Prostatomegaly with median lobe hypertrophy. Other: No abdominal wall hernia or abnormality. No abdominopelvic ascites. Musculoskeletal: No acute or significant osseous findings. 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 IMPRESSION: 1. Slight interval decrease in size of a soft tissue mass of the medial right pulmonary apex and adjacent posterior and middle mediastinum. There is additionally reduced soft tissue stranding about the trachea adjacent to the abutting the mass. 2. Interval reduction in size of small prominent pretracheal and AP window lymph nodes 3. Significant interval increase in size of multiple bilateral pulmonary nodules, largest index nodule in the left lower lobe measuring 2.5 x 2.4 cm, previously 0.7 x 0.7 cm. New nodule of the superior left lower lobe measuring 0.8 x 0.7 cm. Findings are consistent with worsened pulmonary metastatic disease. 4. Overall constellation of findings is consistent with mixed response to treatment. 5. No evidence of metastatic disease within the abdomen or pelvis. 6. Prostatomegaly. Aortic Atherosclerosis (ICD10-I70.0).  Electronically Signed   By: Eddie Candle M.D.   On: 10/12/2020 10:53     ASSESSMENT/PLAN:  This is a very pleasant 65 year old African-American male diagnosed with metastatic non-small cell lung cancer that was initially diagnosed as a stage IIIa ([place], N2, M0) non-small cell lung cancer, adenocarcinoma.  Initially presented with a large right paratracheal soft tissue mass extending into the right apex in the right supraclavicular and precarinal region.  He was diagnosed in August 2021.  He developed metastatic disease to the brain in April 2022.  His molecular studies by [employer] did not show any actionable mutations.    The patient previously underwent a course of concurrent chemoradiation with carboplatin for an 8 AUC of 2, paclitaxel 45 mg per metered squared.  He is status post 7 cycles.  He had a partial response to treatment.  Last dose of treatment was given on February 28, 2020.  The patient then was undergoing consolidative immunotherapy with Imfinzi 1500 mg IV every 4 weeks.  He was status post 5 cycles but was found to have metastatic disease to the brain and is s/p stereotactic left frontoparietal craniotomy for resection of tumor on 08/30/2020.  The patient then was started on systemic chemotherapy with carboplatin for an AUC of 5, Alimta 500 mg per metered squared, Keytruda 200 mg IV every 3 weeks.  He is status post 1 cycle and tolerated it well.   The patient recently had a restaging CT scan performed.  Dr. Julien Nordmann  personally and independently reviewed the scan and discussed the results with the patient.  The scan showed significant interval increase in size of multiple bilateral pulmonary nodules. This is the new baseline to assess treatment response in 9 weeks from starting this new treatment.   Dr. Julien Nordmann recommend that he continue on the same treatment at the same dose.  We will proceed with cycle #2 today scheduled.  We will see him back for follow-up visit in 3 weeks for  evaluation before starting cycle #3. We will continue to monitor his labs closely on weekly labs.   The patient was advised to call immediately if he has any concerning symptoms in the interval. The patient voices understanding of current disease status and treatment options and is in agreement with the current care plan. All questions were answered. The patient knows to call the clinic with any problems, questions or concerns. We can certainly see the patient much sooner if necessary       No orders of the defined types were placed in this encounter.     Kenn Rekowski L Sherida Dobkins, PA-C 10/29/20  ADDENDUM: Hematology/Oncology Attending: I had a face-to-face encounter with the patient today.  I reviewed his record, scan and recommended his care plan.  This is a very pleasant 65 years old African-American male with metastatic non-small cell lung cancer, adenocarcinoma that was initially diagnosed as a stage IIIa ([place], N2, M0) in August 2021 and the patient treated with a course of concurrent chemoradiation followed by consolidation treatment with immunotherapy with Imfinzi.  Unfortunately in April 2022 the patient was found to have metastatic disease to the brain and he underwent surgical resection followed by [facility]. Molecular studies by [employer] showed no actionable mutation. The patient is currently undergoing systemic chemotherapy with carboplatin for AUC of 5, Alimta 500 Mg/M2 and Keytruda 200 Mg IV every 3 weeks status post 1 cycle.  He had baseline imaging studies with CT scan of the chest, abdomen pelvis performed recently.  I personally and independently reviewed the scans and discussed the result with the patient today. His scan showed evidence of systemic disease progression with significant interval increase in size of multiple bilateral pulmonary nodules in addition to a new nodule in the superior left lower lobe. I recommended for the patient to continue his current treatment with  carboplatin, Alimta and Keytruda and he will proceed with cycle #2 today. We will see the patient back for follow-up visit in 3 weeks for evaluation before starting cycle #3. The patient was advised to call immediately if he has any other concerning symptoms in the interval.  The total time spent in the appointment was 30 minutes. Disclaimer: This note was dictated with voice recognition software. Similar sounding words can inadvertently be transcribed and may be missed upon review. Eilleen Kempf, MD 10/29/20

## 2020-10-29 ENCOUNTER — Encounter: Payer: Self-pay | Admitting: Internal Medicine

## 2020-10-29 ENCOUNTER — Inpatient Hospital Stay: Payer: Medicaid Other

## 2020-10-29 ENCOUNTER — Other Ambulatory Visit: Payer: Self-pay

## 2020-10-29 ENCOUNTER — Inpatient Hospital Stay (HOSPITAL_BASED_OUTPATIENT_CLINIC_OR_DEPARTMENT_OTHER): Payer: Medicaid Other | Admitting: Physician Assistant

## 2020-10-29 VITALS — BP 136/84 | HR 83 | Temp 97.6°F | Resp 18 | Wt 131.9 lb

## 2020-10-29 DIAGNOSIS — C3411 Malignant neoplasm of upper lobe, right bronchus or lung: Secondary | ICD-10-CM

## 2020-10-29 DIAGNOSIS — Z5112 Encounter for antineoplastic immunotherapy: Secondary | ICD-10-CM | POA: Diagnosis not present

## 2020-10-29 DIAGNOSIS — Z5111 Encounter for antineoplastic chemotherapy: Secondary | ICD-10-CM

## 2020-10-29 LAB — CBC WITH DIFFERENTIAL (CANCER CENTER ONLY)
Abs Immature Granulocytes: 0.74 10*3/uL — ABNORMAL HIGH (ref 0.00–0.07)
Basophils Absolute: 0 10*3/uL (ref 0.0–0.1)
Basophils Relative: 0 %
Eosinophils Absolute: 0 10*3/uL (ref 0.0–0.5)
Eosinophils Relative: 0 %
HCT: 33.9 % — ABNORMAL LOW (ref 39.0–52.0)
Hemoglobin: 11 g/dL — ABNORMAL LOW (ref 13.0–17.0)
Immature Granulocytes: 10 %
Lymphocytes Relative: 16 %
Lymphs Abs: 1.1 10*3/uL (ref 0.7–4.0)
MCH: 25.8 pg — ABNORMAL LOW (ref 26.0–34.0)
MCHC: 32.4 g/dL (ref 30.0–36.0)
MCV: 79.4 fL — ABNORMAL LOW (ref 80.0–100.0)
Monocytes Absolute: 0.8 10*3/uL (ref 0.1–1.0)
Monocytes Relative: 11 %
Neutro Abs: 4.5 10*3/uL (ref 1.7–7.7)
Neutrophils Relative %: 63 %
Platelet Count: 257 10*3/uL (ref 150–400)
RBC: 4.27 MIL/uL (ref 4.22–5.81)
RDW: 16.4 % — ABNORMAL HIGH (ref 11.5–15.5)
WBC Count: 7.2 10*3/uL (ref 4.0–10.5)
nRBC: 0.7 % — ABNORMAL HIGH (ref 0.0–0.2)

## 2020-10-29 LAB — TSH: TSH: 0.703 u[IU]/mL (ref 0.320–4.118)

## 2020-10-29 LAB — CMP (CANCER CENTER ONLY)
ALT: 19 U/L (ref 0–44)
AST: 15 U/L (ref 15–41)
Albumin: 3.5 g/dL (ref 3.5–5.0)
Alkaline Phosphatase: 49 U/L (ref 38–126)
Anion gap: 10 (ref 5–15)
BUN: 11 mg/dL (ref 8–23)
CO2: 25 mmol/L (ref 22–32)
Calcium: 9.5 mg/dL (ref 8.9–10.3)
Chloride: 105 mmol/L (ref 98–111)
Creatinine: 0.77 mg/dL (ref 0.61–1.24)
GFR, Estimated: >60 mL/min (ref >60)
Glucose, Bld: 91 mg/dL (ref 70–99)
Potassium: 4.1 mmol/L (ref 3.5–5.1)
Sodium: 140 mmol/L (ref 135–145)
Total Bilirubin: 0.2 mg/dL — ABNORMAL LOW (ref 0.3–1.2)
Total Protein: 6.7 g/dL (ref 6.5–8.1)

## 2020-10-29 MED ORDER — SODIUM CHLORIDE 0.9 % IV SOLN
200.0000 mg | Freq: Once | INTRAVENOUS | Status: AC
  Administered 2020-10-29: 200 mg via INTRAVENOUS
  Filled 2020-10-29: qty 8

## 2020-10-29 MED ORDER — SODIUM CHLORIDE 0.9 % IV SOLN
500.0000 mg/m2 | Freq: Once | INTRAVENOUS | Status: AC
  Administered 2020-10-29: 800 mg via INTRAVENOUS
  Filled 2020-10-29: qty 20

## 2020-10-29 MED ORDER — SODIUM CHLORIDE 0.9 % IV SOLN
522.0000 mg | Freq: Once | INTRAVENOUS | Status: AC
  Administered 2020-10-29: 520 mg via INTRAVENOUS
  Filled 2020-10-29: qty 52

## 2020-10-29 MED ORDER — PALONOSETRON HCL INJECTION 0.25 MG/5ML
0.2500 mg | Freq: Once | INTRAVENOUS | Status: AC
  Administered 2020-10-29: 0.25 mg via INTRAVENOUS

## 2020-10-29 MED ORDER — SODIUM CHLORIDE 0.9 % IV SOLN
10.0000 mg | Freq: Once | INTRAVENOUS | Status: AC
  Administered 2020-10-29: 10 mg via INTRAVENOUS
  Filled 2020-10-29: qty 10

## 2020-10-29 MED ORDER — SODIUM CHLORIDE 0.9 % IV SOLN
150.0000 mg | Freq: Once | INTRAVENOUS | Status: AC
  Administered 2020-10-29: 150 mg via INTRAVENOUS
  Filled 2020-10-29: qty 150

## 2020-10-29 MED ORDER — SODIUM CHLORIDE 0.9 % IV SOLN
Freq: Once | INTRAVENOUS | Status: AC
  Filled 2020-10-29: qty 250

## 2020-10-29 MED ORDER — PALONOSETRON HCL INJECTION 0.25 MG/5ML
INTRAVENOUS | Status: AC
  Filled 2020-10-29: qty 5

## 2020-10-29 NOTE — Patient Instructions (Signed)
[facility]   Discharge Instructions: Thank you for choosing [facility] to provide your oncology and hematology care.   If you have a lab appointment with the Cancer Center, please go directly to the Cancer Center and check in at the registration area.   Wear comfortable clothing and clothing appropriate for easy access to any Portacath or PICC line.   We strive to give you quality time with your provider. You may need to reschedule your appointment if you arrive late (15 or more minutes).  Arriving late affects you and other patients whose appointments are after yours.  Also, if you miss three or more appointments without notifying the office, you may be dismissed from the clinic at the provider's discretion.      For prescription refill requests, have your pharmacy contact our office and allow 72 hours for refills to be completed.    Today you received the following chemotherapy and/or immunotherapy agents: pembrolizumab, pemetrexed, and carboplatin.      To help prevent nausea and vomiting after your treatment, we encourage you to take your nausea medication as directed.  BELOW ARE SYMPTOMS THAT SHOULD BE REPORTED IMMEDIATELY: *FEVER GREATER THAN 100.4 F (38 C) OR HIGHER *CHILLS OR SWEATING *NAUSEA AND VOMITING THAT IS NOT CONTROLLED WITH YOUR NAUSEA MEDICATION *UNUSUAL SHORTNESS OF BREATH *UNUSUAL BRUISING OR BLEEDING *URINARY PROBLEMS (pain or burning when urinating, or frequent urination) *BOWEL PROBLEMS (unusual diarrhea, constipation, pain near the anus) TENDERNESS IN MOUTH AND THROAT WITH OR WITHOUT PRESENCE OF ULCERS (sore throat, sores in mouth, or a toothache) UNUSUAL RASH, SWELLING OR PAIN  UNUSUAL VAGINAL DISCHARGE OR ITCHING   Items with * indicate a potential emergency and should be followed up as soon as possible or go to the Emergency Department if any problems should occur.  Please show the CHEMOTHERAPY ALERT CARD or  IMMUNOTHERAPY ALERT CARD at check-in to the Emergency Department and triage nurse.  Should you have questions after your visit or need to cancel or reschedule your appointment, please contact [facility]  Dept: [phone]  and follow the prompts.  Office hours are 8:00 a.m. to 4:30 p.m. Monday - Friday. Please note that voicemails left after 4:00 p.m. may not be returned until the following business day.  We are closed weekends and major holidays. You have access to a nurse at all times for urgent questions. Please call the main number to the clinic Dept: [phone] and follow the prompts.   For any non-urgent questions, you may also contact your provider using MyChart. We now offer e-Visits for anyone 18 and older to request care online for non-urgent symptoms. For details visit [email or web address].   Also download the MyChart app! Go to the app store, search "MyChart", open the app, select [facility], and log in with your MyChart username and password.  Due to Covid, a mask is required upon entering the hospital/clinic. If you do not have a mask, one will be given to you upon arrival. For doctor visits, patients may have 1 support person aged 18 or older with them. For treatment visits, patients cannot have anyone with them due to current Covid guidelines and our immunocompromised population.   

## 2020-10-30 ENCOUNTER — Encounter: Payer: Self-pay | Admitting: Internal Medicine

## 2020-10-31 ENCOUNTER — Encounter (INDEPENDENT_AMBULATORY_CARE_PROVIDER_SITE_OTHER): Payer: Self-pay | Admitting: Primary Care

## 2020-10-31 ENCOUNTER — Other Ambulatory Visit: Payer: Self-pay

## 2020-10-31 ENCOUNTER — Ambulatory Visit (INDEPENDENT_AMBULATORY_CARE_PROVIDER_SITE_OTHER): Payer: Medicaid Other | Admitting: Primary Care

## 2020-10-31 VITALS — BP 121/71 | HR 100 | Temp 97.9°F | Resp 16 | Wt 131.0 lb

## 2020-10-31 DIAGNOSIS — Z09 Encounter for follow-up examination after completed treatment for conditions other than malignant neoplasm: Secondary | ICD-10-CM

## 2020-10-31 NOTE — Progress Notes (Signed)
Has no concerns HFU

## 2020-10-31 NOTE — Progress Notes (Signed)
[facility]   Subjective:   [name] [name] is a 65 y.o. male presents for hospital follow up . 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 30 tablet 4   levETIRAcetam (KEPPRA) 500 MG tablet Take 1 tablet (500 mg total) by mouth 2 (two) times daily. 60 tablet 2   pantoprazole (PROTONIX) 40 MG tablet Take 1 tablet (40 mg total) by mouth daily with supper. 30 tablet 2   predniSONE (DELTASONE) 10 MG tablet See attached directions. 21 tablet 0   prochlorperazine (COMPAZINE) 10 MG tablet Take 1 tablet (10 mg total) by mouth every 6 (six) hours as needed for nausea or vomiting. 30 tablet 0   senna (SENOKOT) 8.6 MG TABS tablet Take 1 tablet (8.6 mg total) by mouth 2 (two) times daily. 120 tablet 0   tiotropium (SPIRIVA HANDIHALER) 18 MCG inhalation capsule Place 1 capsule into inhaler and inhale daily. 30 capsule 1   acetaminophen (TYLENOL) 500 MG tablet Take 500 mg by mouth every 6 (six) hours as needed for mild pain or moderate pain.     budesonide-formoterol (SYMBICORT) 80-4.5 MCG/ACT inhaler Inhale 2 puffs into the lungs 2  (two) times daily. (Patient taking differently: Inhale 2 puffs into the lungs 2 (two) times daily as needed (for flares of wheezing and/or shortness of breath).) 1 Inhaler 11   dexamethasone (DECADRON) 2 MG tablet Take 1 tablet (2 mg total) by mouth every 12 (twelve) hours. (Patient not taking: Reported on 10/31/2020) 60 tablet 2   feeding supplement (ENSURE ENLIVE / ENSURE PLUS) LIQD Take 237 mLs by mouth 2 (two) times daily between meals. 237 mL 12   Multiple Vitamin (MULTIVITAMIN) tablet Take 1 tablet by mouth daily.     VENTOLIN HFA 108 (90 Base) MCG/ACT inhaler Inhale 2 puffs into the lungs every 4 (four) hours as needed for wheezing or shortness of breath.     No current facility-administered medications on file prior to visit.     Review of System: Review of Systems  Constitutional:  Positive for weight loss.  Eyes:  Positive for blurred vision.  Neurological:  Positive for headaches.  All other systems reviewed and are negative.  Objective:  BP 121/71 (BP Location: Left Arm, Patient Position: Sitting, Cuff Size: Normal)   Pulse 100   Temp 97.9 F (36.6 C)   Resp 16   Wt 131 lb (59.4 kg)   SpO2 100%   BMI 24.75 kg/m   Filed Weights   10/31/20 1425  Weight: 131 lb (59.4 kg)    Physical Exam: General Appearance: Well nourished, in  no apparent distress. Eyes: PERRLA, EOMs, conjunctiva no swelling or erythema Sinuses: No Frontal/maxillary tenderness ENT/Mouth: Ext aud canals clear, TMs without erythema, bulging. No erythema, swelling, or exudate on post pharynx. Hearing normal.  Neck: Supple, thyroid normal.  Respiratory: Respiratory effort normal, BS equal bilaterally without rales, rhonchi, wheezing or stridor.  Cardio: RRR with no MRGs. Brisk peripheral pulses without edema.  Abdomen: Soft, + BS.  Non tender, no guarding, rebound, hernias, masses. Lymphatics: Non tender without lymphadenopathy.  Musculoskeletal: Full ROM, 5/5 strength, normal gait.  Skin: Warm, dry  without rashes, lesions, ecchymosis.  Neuro: Cranial nerves intact. Normal muscle tone, no cerebellar symptoms. Sensation intact.  Psych: Awake and oriented X 3, normal affect, Insight and Judgment appropriate.   Diagnoses and all orders for this visit:  Hospital discharge follow-up  Retrieved from d/c summary  Follow up with [facility]; [employer] called and ordered rolling walker and 3:1 to be delivered to the hospital room. Follow up with Golda Acre, [facility] to determine if they are able to provide charity home health for physical therapy and aide for home. Go to [name], NP (Internal Medicine); You are scheduled for a hospital follow up on October 31, 2020 on 2:30 pm. Call Consuella Lose, MD (Neurosurgery) in 2 weeks (10/05/2020); Call to arrange follow-up appointment. Call Curt Bears, MD (Oncology) in 1 week (09/28/2020); These call to arrange for follow-up appointment within 1 week after discharge    This note has been created with Dragon speech recognition software and Engineer, materials. Any transcriptional errors are unintentional.   [name], NP 10/31/2020, 3:03 PM

## 2020-11-01 ENCOUNTER — Telehealth: Payer: Self-pay | Admitting: Physician Assistant

## 2020-11-01 ENCOUNTER — Encounter: Payer: Self-pay | Admitting: Internal Medicine

## 2020-11-01 NOTE — Telephone Encounter (Signed)
Scheduled per los. Called and left msg. Mailed printout  °

## 2020-11-02 ENCOUNTER — Telehealth: Payer: Self-pay

## 2020-11-02 NOTE — Telephone Encounter (Signed)
Ms. Judi Cong requesting a return call regarding pts mediations.  I have called her back and LM advising I was returning her call and to call us back if they still had concerns.

## 2020-11-05 ENCOUNTER — Inpatient Hospital Stay: Payer: Medicaid Other

## 2020-11-05 ENCOUNTER — Other Ambulatory Visit: Payer: Self-pay

## 2020-11-05 DIAGNOSIS — Z5112 Encounter for antineoplastic immunotherapy: Secondary | ICD-10-CM | POA: Diagnosis not present

## 2020-11-05 DIAGNOSIS — C3411 Malignant neoplasm of upper lobe, right bronchus or lung: Secondary | ICD-10-CM

## 2020-11-05 LAB — CBC WITH DIFFERENTIAL (CANCER CENTER ONLY)
Abs Immature Granulocytes: 0.01 10*3/uL (ref 0.00–0.07)
Basophils Absolute: 0 10*3/uL (ref 0.0–0.1)
Basophils Relative: 1 %
Eosinophils Absolute: 0 10*3/uL (ref 0.0–0.5)
Eosinophils Relative: 2 %
HCT: 34.4 % — ABNORMAL LOW (ref 39.0–52.0)
Hemoglobin: 11.2 g/dL — ABNORMAL LOW (ref 13.0–17.0)
Immature Granulocytes: 1 %
Lymphocytes Relative: 33 %
Lymphs Abs: 0.6 10*3/uL — ABNORMAL LOW (ref 0.7–4.0)
MCH: 25.8 pg — ABNORMAL LOW (ref 26.0–34.0)
MCHC: 32.6 g/dL (ref 30.0–36.0)
MCV: 79.3 fL — ABNORMAL LOW (ref 80.0–100.0)
Monocytes Absolute: 0.3 10*3/uL (ref 0.1–1.0)
Monocytes Relative: 14 %
Neutro Abs: 0.9 10*3/uL — ABNORMAL LOW (ref 1.7–7.7)
Neutrophils Relative %: 49 %
Platelet Count: 166 10*3/uL (ref 150–400)
RBC: 4.34 MIL/uL (ref 4.22–5.81)
RDW: 16 % — ABNORMAL HIGH (ref 11.5–15.5)
WBC Count: 1.8 10*3/uL — ABNORMAL LOW (ref 4.0–10.5)
nRBC: 0 % (ref 0.0–0.2)

## 2020-11-05 LAB — CMP (CANCER CENTER ONLY)
ALT: 18 U/L (ref 0–44)
AST: 17 U/L (ref 15–41)
Albumin: 3.7 g/dL (ref 3.5–5.0)
Alkaline Phosphatase: 50 U/L (ref 38–126)
Anion gap: 7 (ref 5–15)
BUN: 16 mg/dL (ref 8–23)
CO2: 27 mmol/L (ref 22–32)
Calcium: 8.9 mg/dL (ref 8.9–10.3)
Chloride: 101 mmol/L (ref 98–111)
Creatinine: 0.65 mg/dL (ref 0.61–1.24)
GFR, Estimated: >60 mL/min (ref >60)
Glucose, Bld: 92 mg/dL (ref 70–99)
Potassium: 3.6 mmol/L (ref 3.5–5.1)
Sodium: 135 mmol/L (ref 135–145)
Total Bilirubin: 0.4 mg/dL (ref 0.3–1.2)
Total Protein: 6.9 g/dL (ref 6.5–8.1)

## 2020-11-06 ENCOUNTER — Other Ambulatory Visit: Payer: Self-pay

## 2020-11-06 ENCOUNTER — Other Ambulatory Visit: Payer: Self-pay | Admitting: Neurosurgery

## 2020-11-06 DIAGNOSIS — C7931 Secondary malignant neoplasm of brain: Secondary | ICD-10-CM

## 2020-11-07 ENCOUNTER — Other Ambulatory Visit: Payer: Self-pay

## 2020-11-12 ENCOUNTER — Encounter: Payer: Self-pay | Admitting: Internal Medicine

## 2020-11-12 ENCOUNTER — Inpatient Hospital Stay: Payer: Medicaid Other

## 2020-11-12 ENCOUNTER — Other Ambulatory Visit: Payer: Self-pay

## 2020-11-12 DIAGNOSIS — Z5112 Encounter for antineoplastic immunotherapy: Secondary | ICD-10-CM | POA: Diagnosis not present

## 2020-11-12 DIAGNOSIS — C3411 Malignant neoplasm of upper lobe, right bronchus or lung: Secondary | ICD-10-CM

## 2020-11-12 LAB — CBC WITH DIFFERENTIAL (CANCER CENTER ONLY)
Abs Immature Granulocytes: 0.06 10*3/uL (ref 0.00–0.07)
Basophils Absolute: 0 10*3/uL (ref 0.0–0.1)
Basophils Relative: 0 %
Eosinophils Absolute: 0 10*3/uL (ref 0.0–0.5)
Eosinophils Relative: 1 %
HCT: 32.2 % — ABNORMAL LOW (ref 39.0–52.0)
Hemoglobin: 10.4 g/dL — ABNORMAL LOW (ref 13.0–17.0)
Immature Granulocytes: 2 %
Lymphocytes Relative: 20 %
Lymphs Abs: 0.8 10*3/uL (ref 0.7–4.0)
MCH: 25.9 pg — ABNORMAL LOW (ref 26.0–34.0)
MCHC: 32.3 g/dL (ref 30.0–36.0)
MCV: 80.3 fL (ref 80.0–100.0)
Monocytes Absolute: 0.8 10*3/uL (ref 0.1–1.0)
Monocytes Relative: 20 %
Neutro Abs: 2.2 10*3/uL (ref 1.7–7.7)
Neutrophils Relative %: 57 %
Platelet Count: 147 10*3/uL — ABNORMAL LOW (ref 150–400)
RBC: 4.01 MIL/uL — ABNORMAL LOW (ref 4.22–5.81)
RDW: 16.6 % — ABNORMAL HIGH (ref 11.5–15.5)
WBC Count: 3.8 10*3/uL — ABNORMAL LOW (ref 4.0–10.5)
nRBC: 0 % (ref 0.0–0.2)

## 2020-11-12 LAB — CMP (CANCER CENTER ONLY)
ALT: 20 U/L (ref 0–44)
AST: 20 U/L (ref 15–41)
Albumin: 3.3 g/dL — ABNORMAL LOW (ref 3.5–5.0)
Alkaline Phosphatase: 57 U/L (ref 38–126)
Anion gap: 9 (ref 5–15)
BUN: 7 mg/dL — ABNORMAL LOW (ref 8–23)
CO2: 25 mmol/L (ref 22–32)
Calcium: 9 mg/dL (ref 8.9–10.3)
Chloride: 105 mmol/L (ref 98–111)
Creatinine: 0.72 mg/dL (ref 0.61–1.24)
GFR, Estimated: >60 mL/min (ref >60)
Glucose, Bld: 84 mg/dL (ref 70–99)
Potassium: 4.1 mmol/L (ref 3.5–5.1)
Sodium: 139 mmol/L (ref 135–145)
Total Bilirubin: <0.2 mg/dL — ABNORMAL LOW (ref 0.3–1.2)
Total Protein: 6.8 g/dL (ref 6.5–8.1)

## 2020-11-13 ENCOUNTER — Other Ambulatory Visit: Payer: Self-pay

## 2020-11-14 ENCOUNTER — Other Ambulatory Visit: Payer: Self-pay

## 2020-11-15 ENCOUNTER — Encounter: Payer: Self-pay | Admitting: Internal Medicine

## 2020-11-20 ENCOUNTER — Inpatient Hospital Stay: Payer: Medicaid Other

## 2020-11-20 ENCOUNTER — Inpatient Hospital Stay: Payer: Medicaid Other | Attending: Internal Medicine | Admitting: Internal Medicine

## 2020-11-20 ENCOUNTER — Other Ambulatory Visit: Payer: Self-pay | Admitting: Internal Medicine

## 2020-11-20 ENCOUNTER — Other Ambulatory Visit: Payer: Self-pay | Admitting: *Deleted

## 2020-11-20 ENCOUNTER — Other Ambulatory Visit: Payer: Self-pay

## 2020-11-20 ENCOUNTER — Encounter: Payer: Self-pay | Admitting: Internal Medicine

## 2020-11-20 VITALS — BP 138/86 | HR 101 | Temp 97.4°F | Resp 19 | Ht 61.0 in | Wt 131.8 lb

## 2020-11-20 VITALS — HR 99

## 2020-11-20 DIAGNOSIS — D509 Iron deficiency anemia, unspecified: Secondary | ICD-10-CM | POA: Insufficient documentation

## 2020-11-20 DIAGNOSIS — C3490 Malignant neoplasm of unspecified part of unspecified bronchus or lung: Secondary | ICD-10-CM

## 2020-11-20 DIAGNOSIS — C3491 Malignant neoplasm of unspecified part of right bronchus or lung: Secondary | ICD-10-CM | POA: Diagnosis present

## 2020-11-20 DIAGNOSIS — C3411 Malignant neoplasm of upper lobe, right bronchus or lung: Secondary | ICD-10-CM

## 2020-11-20 DIAGNOSIS — Z5112 Encounter for antineoplastic immunotherapy: Secondary | ICD-10-CM | POA: Insufficient documentation

## 2020-11-20 DIAGNOSIS — C7931 Secondary malignant neoplasm of brain: Secondary | ICD-10-CM | POA: Insufficient documentation

## 2020-11-20 DIAGNOSIS — Z5111 Encounter for antineoplastic chemotherapy: Secondary | ICD-10-CM | POA: Diagnosis present

## 2020-11-20 DIAGNOSIS — Z79899 Other long term (current) drug therapy: Secondary | ICD-10-CM | POA: Diagnosis not present

## 2020-11-20 LAB — CMP (CANCER CENTER ONLY)
ALT: 16 U/L (ref 0–44)
AST: 16 U/L (ref 15–41)
Albumin: 3.3 g/dL — ABNORMAL LOW (ref 3.5–5.0)
Alkaline Phosphatase: 53 U/L (ref 38–126)
Anion gap: 10 (ref 5–15)
BUN: 9 mg/dL (ref 8–23)
CO2: 26 mmol/L (ref 22–32)
Calcium: 9.3 mg/dL (ref 8.9–10.3)
Chloride: 106 mmol/L (ref 98–111)
Creatinine: 0.72 mg/dL (ref 0.61–1.24)
GFR, Estimated: >60 mL/min (ref >60)
Glucose, Bld: 88 mg/dL (ref 70–99)
Potassium: 3.9 mmol/L (ref 3.5–5.1)
Sodium: 142 mmol/L (ref 135–145)
Total Bilirubin: <0.2 mg/dL — ABNORMAL LOW (ref 0.3–1.2)
Total Protein: 6.5 g/dL (ref 6.5–8.1)

## 2020-11-20 LAB — CBC WITH DIFFERENTIAL (CANCER CENTER ONLY)
Abs Immature Granulocytes: 0.24 10*3/uL — ABNORMAL HIGH (ref 0.00–0.07)
Basophils Absolute: 0 10*3/uL (ref 0.0–0.1)
Basophils Relative: 1 %
Eosinophils Absolute: 0 10*3/uL (ref 0.0–0.5)
Eosinophils Relative: 0 %
HCT: 31.1 % — ABNORMAL LOW (ref 39.0–52.0)
Hemoglobin: 10.2 g/dL — ABNORMAL LOW (ref 13.0–17.0)
Immature Granulocytes: 4 %
Lymphocytes Relative: 13 %
Lymphs Abs: 0.8 10*3/uL (ref 0.7–4.0)
MCH: 26.6 pg (ref 26.0–34.0)
MCHC: 32.8 g/dL (ref 30.0–36.0)
MCV: 81 fL (ref 80.0–100.0)
Monocytes Absolute: 0.8 10*3/uL (ref 0.1–1.0)
Monocytes Relative: 12 %
Neutro Abs: 4.4 10*3/uL (ref 1.7–7.7)
Neutrophils Relative %: 70 %
Platelet Count: 360 10*3/uL (ref 150–400)
RBC: 3.84 MIL/uL — ABNORMAL LOW (ref 4.22–5.81)
RDW: 19.5 % — ABNORMAL HIGH (ref 11.5–15.5)
WBC Count: 6.3 10*3/uL (ref 4.0–10.5)
nRBC: 1 % — ABNORMAL HIGH (ref 0.0–0.2)

## 2020-11-20 LAB — TSH: TSH: 0.682 u[IU]/mL (ref 0.320–4.118)

## 2020-11-20 MED ORDER — FOSAPREPITANT DIMEGLUMINE INJECTION 150 MG
150.0000 mg | Freq: Once | INTRAVENOUS | Status: AC
  Administered 2020-11-20: 150 mg via INTRAVENOUS
  Filled 2020-11-20: qty 150

## 2020-11-20 MED ORDER — PALONOSETRON HCL INJECTION 0.25 MG/5ML
INTRAVENOUS | Status: AC
  Filled 2020-11-20: qty 5

## 2020-11-20 MED ORDER — SODIUM CHLORIDE 0.9 % IV SOLN
522.0000 mg | Freq: Once | INTRAVENOUS | Status: AC
  Administered 2020-11-20: 520 mg via INTRAVENOUS
  Filled 2020-11-20: qty 52

## 2020-11-20 MED ORDER — DEXAMETHASONE SODIUM PHOSPHATE 100 MG/10ML IJ SOLN
10.0000 mg | Freq: Once | INTRAMUSCULAR | Status: AC
  Administered 2020-11-20: 10 mg via INTRAVENOUS
  Filled 2020-11-20: qty 10

## 2020-11-20 MED ORDER — CYANOCOBALAMIN 1000 MCG/ML IJ SOLN
INTRAMUSCULAR | Status: AC
  Filled 2020-11-20: qty 1

## 2020-11-20 MED ORDER — SODIUM CHLORIDE 0.9 % IV SOLN
500.0000 mg/m2 | Freq: Once | INTRAVENOUS | Status: AC
  Administered 2020-11-20: 800 mg via INTRAVENOUS
  Filled 2020-11-20: qty 20

## 2020-11-20 MED ORDER — CYANOCOBALAMIN 1000 MCG/ML IJ SOLN
1000.0000 ug | Freq: Once | INTRAMUSCULAR | Status: AC
  Administered 2020-11-20: 1000 ug via INTRAMUSCULAR

## 2020-11-20 MED ORDER — PALONOSETRON HCL INJECTION 0.25 MG/5ML
0.2500 mg | Freq: Once | INTRAVENOUS | Status: AC
  Administered 2020-11-20: 0.25 mg via INTRAVENOUS

## 2020-11-20 MED ORDER — SODIUM CHLORIDE 0.9 % IV SOLN
Freq: Once | INTRAVENOUS | Status: AC
  Filled 2020-11-20: qty 250

## 2020-11-20 MED ORDER — SODIUM CHLORIDE 0.9 % IV SOLN
200.0000 mg | Freq: Once | INTRAVENOUS | Status: AC
  Administered 2020-11-20: 200 mg via INTRAVENOUS
  Filled 2020-11-20: qty 8

## 2020-11-20 NOTE — Patient Instructions (Signed)
[facility]  Discharge Instructions: Thank you for choosing [facility] to provide your oncology and hematology care.   If you have a lab appointment with the [facility], please go directly to the [facility] and check in at the registration area.   Wear comfortable clothing and clothing appropriate for easy access to any Portacath or PICC line.   We strive to give you quality time with your provider. You may need to reschedule your appointment if you arrive late (15 or more minutes).  Arriving late affects you and other patients whose appointments are after yours.  Also, if you miss three or more appointments without notifying the office, you may be dismissed from the clinic at the provider's discretion.      For prescription refill requests, have your pharmacy contact our office and allow 72 hours for refills to be completed.    Today you received the following chemotherapy and/or immunotherapy agents: Keytruda/Alimta/Carbo.      To help prevent nausea and vomiting after your treatment, we encourage you to take your nausea medication as directed.  BELOW ARE SYMPTOMS THAT SHOULD BE REPORTED IMMEDIATELY: *FEVER GREATER THAN 100.4 F (38 C) OR HIGHER *CHILLS OR SWEATING *NAUSEA AND VOMITING THAT IS NOT CONTROLLED WITH YOUR NAUSEA MEDICATION *UNUSUAL SHORTNESS OF BREATH *UNUSUAL BRUISING OR BLEEDING *URINARY PROBLEMS (pain or burning when urinating, or frequent urination) *BOWEL PROBLEMS (unusual diarrhea, constipation, pain near the anus) TENDERNESS IN MOUTH AND THROAT WITH OR WITHOUT PRESENCE OF ULCERS (sore throat, sores in mouth, or a toothache) UNUSUAL RASH, SWELLING OR PAIN  UNUSUAL VAGINAL DISCHARGE OR ITCHING   Items with * indicate a potential emergency and should be followed up as soon as possible or go to the Emergency Department if any problems should occur.  Please show the CHEMOTHERAPY ALERT CARD or IMMUNOTHERAPY ALERT CARD at  check-in to the Emergency Department and triage nurse.  Should you have questions after your visit or need to cancel or reschedule your appointment, please contact [facility]  Dept: [phone]  and follow the prompts.  Office hours are 8:00 a.m. to 4:30 p.m. Monday - Friday. Please note that voicemails left after 4:00 p.m. may not be returned until the following business day.  We are closed weekends and major holidays. You have access to a nurse at all times for urgent questions. Please call the main number to the clinic Dept: [phone] and follow the prompts.   For any non-urgent questions, you may also contact your provider using MyChart. We now offer e-Visits for anyone 64 and older to request care online for non-urgent symptoms. For details visit [email or web address].   Also download the MyChart app! Go to the app store, search "MyChart", open the app, select [facility], and log in with your MyChart username and password.  Due to Covid, a mask is required upon entering the hospital/clinic. If you do not have a mask, one will be given to you upon arrival. For doctor visits, patients may have 1 support person aged 64 or older with them. For treatment visits, patients cannot have anyone with them due to current Covid guidelines and our immunocompromised population.

## 2020-11-20 NOTE — Progress Notes (Signed)
.[name] [name] Telephone:([phone]) [phone]   Fax:(336) [phone]  OFFICE PROGRESS NOTE  [name], NP [id number] [id number]  DIAGNOSIS: Metastatic non-small cell lung cancer initially diagnosed as stage IIIA (Tx, N2, M0) non-small cell lung cancer, adenocarcinoma diagnosed in August 2021 and presented with large right paratracheal soft tissue mass extending from the right apex to the right suprahilar and precarinal region.  Molecular studies by [employer]:  [id number], 31.9%, Olaparib  [id number], 68.6%. None   BRAFAmplification, High (+++) Plasma Copy Number: 5.0  PRIOR THERAPY:  1) Concurrent chemoradiation with weekly carboplatin for AUC of 2 and paclitaxel 45 mg/M2.  First dose January 17, 2020.  Status post 7 cycles.  Last dose was given on February 28, 2020 2) Consolidation treatment with immunotherapy with Imfinzi 1500 mg IV every 4 weeks.  First dose April 09, 2020.  Status post 5 cycles.  Last dose was given July 30, 2020 discontinued secondary to disease progression with brain metastasis. 3) Stereotactic left frontoparietal craniotomy for resection of tumor on 08/30/2020.   CURRENT THERAPY: First-line systemic chemotherapy with carboplatin for AUC of 5, Alimta 500 Mg/M2 and Keytruda 200 Mg IV every 3 weeks.  First dose Oct 08, 2020.  Status post 2 cycles.   INTERVAL HISTORY: [name] [name] 65 y.o. male returns to the clinic today for follow-up visit.  The patient is feeling fine today with no concerning complaints except for mild fatigue.  He continues to tolerate his systemic chemotherapy fairly well.  He denied having any nausea, vomiting, diarrhea or constipation.  He denied having any headache or visual changes.  He has no weight loss or night sweats.  He has no fever or chills.  He is here today for evaluation before starting cycle #3 of his treatment.  MEDICAL HISTORY: Past Medical History:  Diagnosis Date   Bullous  emphysema ([place]) 12/25/2019   Cancer ([place])    COPD (chronic obstructive pulmonary disease) ([place]) 11/02/2007   Qualifier: Diagnosis of  By: Melvyn Novas MD, Christena Deem    Hypertension 12/24/2019   Iron deficiency anemia 08/23/2007   Qualifier: Diagnosis of  By: Jim Like      ALLERGIES:  is allergic to other.  MEDICATIONS:  Current Outpatient Medications  Medication Sig Dispense Refill   acetaminophen (TYLENOL) 500 MG tablet Take 500 mg by mouth every 6 (six) hours as needed for mild pain or moderate pain.     budesonide-formoterol (SYMBICORT) 80-4.5 MCG/ACT inhaler Inhale 2 puffs into the lungs 2 (two) times daily. (Patient taking differently: Inhale 2 puffs into the lungs 2 (two) times daily as needed (for flares of wheezing and/or shortness of breath).) 1 Inhaler 11   dexamethasone (DECADRON) 2 MG tablet Take 1 tablet (2 mg total) by mouth every 12 (twelve) hours. (Patient not taking: Reported on 10/31/2020) 60 tablet 2   feeding supplement (ENSURE ENLIVE / ENSURE PLUS) LIQD Take 237 mLs by mouth 2 (two) times daily between meals. 235 mL 12   folic acid (FOLVITE) 1 MG tablet Take 1 tablet (1 mg total) by mouth daily. 30 tablet 4   levETIRAcetam (KEPPRA) 500 MG tablet Take 1 tablet (500 mg total) by mouth 2 (two) times daily. 60 tablet 2   Multiple Vitamin (MULTIVITAMIN) tablet Take 1 tablet by mouth daily.     pantoprazole (PROTONIX) 40 MG tablet Take 1 tablet (40 mg total) by mouth daily with supper. 30 tablet 2   predniSONE (DELTASONE) 10  MG tablet See attached directions. 21 tablet 0   prochlorperazine (COMPAZINE) 10 MG tablet Take 1 tablet (10 mg total) by mouth every 6 (six) hours as needed for nausea or vomiting. 30 tablet 0   senna (SENOKOT) 8.6 MG TABS tablet Take 1 tablet (8.6 mg total) by mouth 2 (two) times daily. 120 tablet 0   tiotropium (SPIRIVA HANDIHALER) 18 MCG inhalation capsule Place 1 capsule into inhaler and inhale daily. 30 capsule 1   VENTOLIN HFA 108 (90 Base) MCG/ACT  inhaler Inhale 2 puffs into the lungs every 4 (four) hours as needed for wheezing or shortness of breath.     No current facility-administered medications for this visit.    SURGICAL HISTORY:  Past Surgical History:  Procedure Laterality Date   APPLICATION OF CRANIAL NAVIGATION N/A 08/30/2020   Procedure: APPLICATION OF CRANIAL NAVIGATION;  Surgeon: Consuella Lose, MD;  Location: [facility];  Service: Neurosurgery;  Laterality: N/A;   CRANIOTOMY Left 08/30/2020   Procedure: Stereotactic left frontoparietal craniectomy for resection of tumor with brainlab;  Surgeon: Consuella Lose, MD;  Location: [facility];  Service: Neurosurgery;  Laterality: Left;   ENDOBRONCHIAL ULTRASOUND N/A 12/27/2019   Procedure: ENDOBRONCHIAL ULTRASOUND;  Surgeon: Laurin Coder, MD;  Location: [facility];  Service: Endoscopy;  Laterality: N/A;   FINE NEEDLE ASPIRATION  12/27/2019   Procedure: FINE NEEDLE ASPIRATION;  Surgeon: Laurin Coder, MD;  Location: [facility];  Service: Endoscopy;;   VIDEO BRONCHOSCOPY N/A 12/27/2019   Procedure: VIDEO BRONCHOSCOPY WITHOUT FLUORO;  Surgeon: Laurin Coder, MD;  Location: [facility];  Service: Endoscopy;  Laterality: N/A;    REVIEW OF SYSTEMS:  A comprehensive review of systems was negative except for: Constitutional: positive for fatigue Musculoskeletal: positive for muscle weakness   PHYSICAL EXAMINATION: General appearance: alert, cooperative, fatigued, and no distress Head: Normocephalic, without obvious abnormality, atraumatic Neck: no adenopathy, no JVD, supple, symmetrical, trachea midline, and thyroid not enlarged, symmetric, no tenderness/mass/nodules Lymph nodes: Cervical, supraclavicular, and axillary nodes normal. Resp: clear to auscultation bilaterally Back: symmetric, no curvature. ROM normal. No CVA tenderness. Cardio: regular rate and rhythm, S1, S2 normal, no murmur, click, rub or gallop GI: soft, non-tender; bowel sounds normal; no masses,   no organomegaly Extremities: extremities normal, atraumatic, no cyanosis or edema  ECOG PERFORMANCE STATUS: 1 - Symptomatic but completely ambulatory  Blood pressure 138/86, pulse (!) 101, temperature (!) 97.4 F (36.3 C), temperature source Tympanic, resp. rate 19, height 5\' 1"  (1.549 m), weight 131 lb 12.8 oz (59.8 kg), SpO2 99 %.  LABORATORY DATA: Lab Results  Component Value Date   WBC 6.3 11/20/2020   HGB 10.2 (L) 11/20/2020   HCT 31.1 (L) 11/20/2020   MCV 81.0 11/20/2020   PLT 360 11/20/2020      Chemistry      Component Value Date/Time   NA 142 11/20/2020 0824   NA 144 08/03/2019 1527   K 3.9 11/20/2020 0824   CL 106 11/20/2020 0824   CO2 26 11/20/2020 0824   BUN 9 11/20/2020 0824   BUN 9 08/03/2019 1527   CREATININE 0.72 11/20/2020 0824      Component Value Date/Time   CALCIUM 9.3 11/20/2020 0824   ALKPHOS 53 11/20/2020 0824   AST 16 11/20/2020 0824   ALT 16 11/20/2020 0824   BILITOT <0.2 (L) 11/20/2020 0824       RADIOGRAPHIC STUDIES: No results found.  ASSESSMENT AND PLAN: This is a very pleasant 65 years old African-American male recently diagnosed with  metastatic non-small cell lung cancer that was initially diagnosed as stage IIIA (Tx, N2, M0) non-small cell lung cancer, adenocarcinoma presented with large right paratracheal soft tissue mass extending from the right apex to the right suprahilar and precarinal region diagnosed in August 2021.  The patient developed brain metastasis in April 2022. Molecular studies by [employer] showed no actionable mutations. The patient completed a course of concurrent chemoradiation with weekly carboplatin for AUC of 2 and paclitaxel 45 mg/M2.  He is status post 7 cycles.  Last dose was given February 28, 2020 The patient tolerated the previous treatment well and he had partial response. The patient is currently undergoing consolidation treatment with immunotherapy with Imfinzi 1500 mg IV every 4 weeks status post 5  cycles. 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 The patient is here today to start the first cycle of systemic chemotherapy with carboplatin for AUC of 5, Alimta 500 Mg/M2 and Keytruda 200 Mg IV every 3 weeks.  Status post 2 cycles. The patient continues to tolerate his treatment fairly well with no concerning adverse effect except for mild fatigue. I recommended for him to proceed with cycle #3 today as planned. I will see him back for follow-up visit in 3 weeks for evaluation before starting cycle #4 with repeat CT scan of the chest, abdomen pelvis for restaging of his disease. The patient was advised to call immediately if he has any concerning symptoms in the interval. The patient voices understanding of current disease status and treatment options and is in agreement with the current care plan.  All questions were answered. The patient knows to call the clinic with any problems, questions or concerns. We can certainly see the patient much sooner if necessary.  Disclaimer: This note was dictated with voice recognition software. Similar sounding words can inadvertently be transcribed and may not be corrected upon review.

## 2020-11-22 ENCOUNTER — Telehealth: Payer: Self-pay | Admitting: Internal Medicine

## 2020-11-22 NOTE — Telephone Encounter (Signed)
Scheduled appointment per 07/05 los. Patient is aware. 

## 2020-11-26 ENCOUNTER — Other Ambulatory Visit: Payer: Self-pay

## 2020-11-26 ENCOUNTER — Inpatient Hospital Stay: Payer: Medicaid Other

## 2020-11-26 DIAGNOSIS — Z5112 Encounter for antineoplastic immunotherapy: Secondary | ICD-10-CM | POA: Diagnosis not present

## 2020-11-26 DIAGNOSIS — C3411 Malignant neoplasm of upper lobe, right bronchus or lung: Secondary | ICD-10-CM

## 2020-11-26 LAB — CBC WITH DIFFERENTIAL (CANCER CENTER ONLY)
Abs Immature Granulocytes: 0.02 10*3/uL (ref 0.00–0.07)
Basophils Absolute: 0 10*3/uL (ref 0.0–0.1)
Basophils Relative: 0 %
Eosinophils Absolute: 0 10*3/uL (ref 0.0–0.5)
Eosinophils Relative: 1 %
HCT: 33.4 % — ABNORMAL LOW (ref 39.0–52.0)
Hemoglobin: 10.8 g/dL — ABNORMAL LOW (ref 13.0–17.0)
Immature Granulocytes: 1 %
Lymphocytes Relative: 21 %
Lymphs Abs: 0.5 10*3/uL — ABNORMAL LOW (ref 0.7–4.0)
MCH: 26.5 pg (ref 26.0–34.0)
MCHC: 32.3 g/dL (ref 30.0–36.0)
MCV: 81.9 fL (ref 80.0–100.0)
Monocytes Absolute: 0.2 10*3/uL (ref 0.1–1.0)
Monocytes Relative: 9 %
Neutro Abs: 1.6 10*3/uL — ABNORMAL LOW (ref 1.7–7.7)
Neutrophils Relative %: 68 %
Platelet Count: 295 10*3/uL (ref 150–400)
RBC: 4.08 MIL/uL — ABNORMAL LOW (ref 4.22–5.81)
RDW: 18.6 % — ABNORMAL HIGH (ref 11.5–15.5)
WBC Count: 2.3 10*3/uL — ABNORMAL LOW (ref 4.0–10.5)
nRBC: 0 % (ref 0.0–0.2)

## 2020-11-26 LAB — TSH: TSH: 1.527 u[IU]/mL (ref 0.320–4.118)

## 2020-11-26 LAB — CMP (CANCER CENTER ONLY)
ALT: 17 U/L (ref 0–44)
AST: 17 U/L (ref 15–41)
Albumin: 3.6 g/dL (ref 3.5–5.0)
Alkaline Phosphatase: 53 U/L (ref 38–126)
Anion gap: 10 (ref 5–15)
BUN: 11 mg/dL (ref 8–23)
CO2: 26 mmol/L (ref 22–32)
Calcium: 9.7 mg/dL (ref 8.9–10.3)
Chloride: 102 mmol/L (ref 98–111)
Creatinine: 0.72 mg/dL (ref 0.61–1.24)
GFR, Estimated: >60 mL/min (ref >60)
Glucose, Bld: 85 mg/dL (ref 70–99)
Potassium: 3.8 mmol/L (ref 3.5–5.1)
Sodium: 138 mmol/L (ref 135–145)
Total Bilirubin: 0.5 mg/dL (ref 0.3–1.2)
Total Protein: 7.1 g/dL (ref 6.5–8.1)

## 2020-11-29 ENCOUNTER — Other Ambulatory Visit: Payer: Self-pay | Admitting: Radiology

## 2020-11-29 ENCOUNTER — Ambulatory Visit (HOSPITAL_COMMUNITY)
Admission: RE | Admit: 2020-11-29 | Discharge: 2020-11-29 | Disposition: A | Discharge status: Home / Self Care | Payer: Medicaid Other | Source: Ambulatory Visit | Attending: Radiation Oncology | Admitting: Radiation Oncology

## 2020-11-29 ENCOUNTER — Other Ambulatory Visit: Payer: Self-pay

## 2020-11-29 ENCOUNTER — Encounter (HOSPITAL_COMMUNITY): Payer: Self-pay

## 2020-11-29 DIAGNOSIS — C7931 Secondary malignant neoplasm of brain: Secondary | ICD-10-CM

## 2020-11-29 MED ORDER — SODIUM CHLORIDE (PF) 0.9 % IJ SOLN
INTRAMUSCULAR | Status: AC
  Filled 2020-11-29: qty 50

## 2020-11-29 MED ORDER — IOHEXOL 350 MG/ML SOLN
100.0000 mL | Freq: Once | INTRAVENOUS | Status: AC | PRN
  Administered 2020-11-29: 80 mL via INTRAVENOUS

## 2020-11-30 ENCOUNTER — Ambulatory Visit (HOSPITAL_COMMUNITY)
Admission: RE | Admit: 2020-11-30 | Discharge: 2020-11-30 | Disposition: A | Discharge status: Home / Self Care | Payer: Medicaid Other | Source: Ambulatory Visit | Attending: Internal Medicine | Admitting: Internal Medicine

## 2020-11-30 ENCOUNTER — Encounter (HOSPITAL_COMMUNITY): Payer: Self-pay

## 2020-11-30 ENCOUNTER — Other Ambulatory Visit: Payer: Self-pay | Admitting: Internal Medicine

## 2020-11-30 ENCOUNTER — Ambulatory Visit (HOSPITAL_COMMUNITY)
Admission: RE | Admit: 2020-11-30 | Discharge: 2020-11-30 | Disposition: A | Discharge status: Home / Self Care | Payer: Medicaid Other | Source: Ambulatory Visit | Attending: Radiation Oncology | Admitting: Radiation Oncology

## 2020-11-30 DIAGNOSIS — C7931 Secondary malignant neoplasm of brain: Secondary | ICD-10-CM | POA: Diagnosis not present

## 2020-11-30 DIAGNOSIS — Z87891 Personal history of nicotine dependence: Secondary | ICD-10-CM | POA: Diagnosis not present

## 2020-11-30 DIAGNOSIS — Z79899 Other long term (current) drug therapy: Secondary | ICD-10-CM | POA: Insufficient documentation

## 2020-11-30 DIAGNOSIS — C3411 Malignant neoplasm of upper lobe, right bronchus or lung: Secondary | ICD-10-CM

## 2020-11-30 DIAGNOSIS — C3490 Malignant neoplasm of unspecified part of unspecified bronchus or lung: Secondary | ICD-10-CM | POA: Insufficient documentation

## 2020-11-30 HISTORY — PX: IR IMAGING GUIDED PORT INSERTION: IMG5740

## 2020-11-30 MED ORDER — FENTANYL CITRATE (PF) 100 MCG/2ML IJ SOLN
INTRAMUSCULAR | Status: AC | PRN
  Administered 2020-11-30 (×2): 50 ug via INTRAVENOUS

## 2020-11-30 MED ORDER — HEPARIN SOD (PORK) LOCK FLUSH 100 UNIT/ML IV SOLN
INTRAVENOUS | Status: AC
  Filled 2020-11-30: qty 5

## 2020-11-30 MED ORDER — MIDAZOLAM HCL 2 MG/2ML IJ SOLN
INTRAMUSCULAR | Status: AC | PRN
  Administered 2020-11-30 (×3): 1 mg via INTRAVENOUS

## 2020-11-30 MED ORDER — SODIUM CHLORIDE 0.9 % IV SOLN: INTRAVENOUS | Status: DC

## 2020-11-30 MED ORDER — LIDOCAINE-EPINEPHRINE 1 %-1:100000 IJ SOLN
INTRAMUSCULAR | Status: AC
  Filled 2020-11-30: qty 1

## 2020-11-30 MED ORDER — LIDOCAINE-EPINEPHRINE 1 %-1:100000 IJ SOLN
INTRAMUSCULAR | Status: AC | PRN
  Administered 2020-11-30 (×2): 10 mL via INTRADERMAL

## 2020-11-30 MED ORDER — FENTANYL CITRATE (PF) 100 MCG/2ML IJ SOLN
INTRAMUSCULAR | Status: AC
  Filled 2020-11-30: qty 2

## 2020-11-30 MED ORDER — HEPARIN SOD (PORK) LOCK FLUSH 100 UNIT/ML IV SOLN
INTRAVENOUS | Status: AC | PRN
  Administered 2020-11-30: 500 [IU] via INTRAVENOUS

## 2020-11-30 MED ORDER — MIDAZOLAM HCL 2 MG/2ML IJ SOLN
INTRAMUSCULAR | Status: AC
  Filled 2020-11-30: qty 4

## 2020-11-30 NOTE — Consult Note (Signed)
Chief Complaint: Patient was seen in consultation today for port a cath placement  Referring Physician(s): Mohamed,Mohamed  Supervising Physician: Jacqulynn Cadet  Patient Status: [facility] - Out-pt  History of Present Illness: [name] [name] is a 66 y.o. male , ex smoker, with history of metastatic non-small cell lung cancer ,initially diagnosed as stage IIIA (Tx, N2, M0) non-small cell lung cancer, adenocarcinoma  in August 2021 and who presented with large right paratracheal soft tissue mass extending from the right apex to the right suprahilar and precarinal region.  He has undergone prior chemoradiation and immunotherapy.  He underwent stereotactic left frontoparietal craniotomy for resection of tumor on 08/30/2020.  He has poor venous access and presents today for Port-A-Cath placement to assist with additional treatment.  Past Medical History:  Diagnosis Date   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[place]) 12/25/2019   Cancer ([place])    COPD (chronic obstructive pulmonary disease) ([place]) 11/02/2007   Qualifier: Diagnosis of  By: Melvyn Novas MD, Christena Deem    Hypertension 12/24/2019   Iron deficiency anemia 08/23/2007   Qualifier: Diagnosis of  By: Jim Like      Past Surgical History:  Procedure Laterality Date   APPLICATION OF CRANIAL NAVIGATION N/A 08/30/2020   Procedure: APPLICATION OF CRANIAL NAVIGATION;  Surgeon: Consuella Lose, MD;  Location: [place];  Service: Neurosurgery;  Laterality: N/A;   CRANIOTOMY Left 08/30/2020   Procedure: Stereotactic left frontoparietal craniectomy for resection of tumor with brainlab;  Surgeon: Consuella Lose, MD;  Location: [place];  Service: Neurosurgery;  Laterality: Left;   ENDOBRONCHIAL ULTRASOUND N/A 12/27/2019   Procedure: ENDOBRONCHIAL ULTRASOUND;  Surgeon: Laurin Coder, MD;  Location: [facility];  Service: Endoscopy;  Laterality: N/A;   FINE NEEDLE ASPIRATION  12/27/2019   Procedure: FINE NEEDLE ASPIRATION;  Surgeon: Laurin Coder, MD;   Location: [facility];  Service: Endoscopy;;   VIDEO BRONCHOSCOPY N/A 12/27/2019   Procedure: VIDEO BRONCHOSCOPY WITHOUT FLUORO;  Surgeon: Laurin Coder, MD;  Location: [facility];  Service: Endoscopy;  Laterality: N/A;    Allergies: Other  Medications: Prior to Admission medications   Medication Sig Start Date End Date Taking? Authorizing Provider  acetaminophen (TYLENOL) 500 MG tablet Take 500 mg by mouth every 6 (six) hours as needed for mild pain or moderate pain.   Yes [provider]  budesonide-formoterol (SYMBICORT) 80-4.5 MCG/ACT inhaler Inhale 2 puffs into the lungs 2 (two) times daily. Patient taking differently: Inhale 2 puffs into the lungs 2 (two) times daily as needed (for flares of wheezing and/or shortness of breath). 08/03/19  Yes [name], NP  feeding supplement (ENSURE ENLIVE / ENSURE PLUS) LIQD Take 237 mLs by mouth 2 (two) times daily between meals. 03/15/20  Yes [name], NP  folic acid (FOLVITE) 1 MG tablet Take 1 tablet (1 mg total) by mouth daily. 09/27/20  Yes Curt Bears, MD  levETIRAcetam (KEPPRA) 500 MG tablet Take 1 tablet (500 mg total) by mouth 2 (two) times daily. 09/20/20  Yes Samella Parr, NP  Multiple Vitamin (MULTIVITAMIN) tablet Take 1 tablet by mouth daily.   Yes [provider]  pantoprazole (PROTONIX) 40 MG tablet Take 1 tablet (40 mg total) by mouth daily with supper. 09/20/20  Yes Samella Parr, NP  senna (SENOKOT) 8.6 MG TABS tablet Take 1 tablet (8.6 mg total) by mouth 2 (two) times daily. 09/20/20  Yes Samella Parr, NP  tiotropium (SPIRIVA HANDIHALER) 18 MCG inhalation capsule Place 1 capsule into inhaler and inhale daily. 12/28/19  Yes Nicolette Bang, MD  VENTOLIN HFA 108 [phone] Base) MCG/ACT inhaler Inhale 2 puffs into the lungs every 4 (four) hours as needed for wheezing or shortness of breath. 12/28/19  Yes [provider]  dexamethasone (DECADRON) 2 MG tablet Take 1 tablet  (2 mg total) by mouth every 12 (twelve) hours. Patient not taking: Reported on 10/31/2020 09/20/20   Samella Parr, NP  prochlorperazine (COMPAZINE) 10 MG tablet Take 1 tablet (10 mg total) by mouth every 6 (six) hours as needed for nausea or vomiting. 09/28/20   Curt Bears, MD     Family History  Problem Relation Age of Onset   Hypertension Other     Social History   Socioeconomic History   Marital status: Single    Spouse name: Not on file   Number of children: Not on file   Years of education: Not on file   Highest education level: Not on file  Occupational History   Not on file  Tobacco Use   Smoking status: Former    Packs/day: 0.50    Types: Cigarettes   Smokeless tobacco: Never   Tobacco comments:    Pt states he stopped since he was discharged from the hospital  Vaping Use   Vaping Use: Never used  Substance and Sexual Activity   Alcohol use: Not Currently   Drug use: Never   Sexual activity: Not Currently  Other Topics Concern   Not on file  Social History Narrative   Not on file   Social Determinants of Health   Financial Resource Strain: High Risk   Difficulty of Paying Living Expenses: Hard  Food Insecurity: No Food Insecurity   Worried About Charity fundraiser in the Last Year: Never true   Ran Out of Food in the Last Year: Never true  Transportation Needs: Unmet Transportation Needs   Lack of Transportation (Medical): Yes   Lack of Transportation (Non-Medical): Yes  Physical Activity: Not on file  Stress: Not on file  Social Connections: Not on file      Review of Systems currently denies fever, headache, chest pain, dyspnea, abdominal/back pain, nausea, vomiting or bleeding.  Does have occasional cough.  Vital Signs: BP 128/84   Pulse (!) 101   Temp 98.1 F (36.7 C) (Oral)   Resp 16   SpO2 98%   Physical Exam awake, alert.  Chest clear to auscultation bilaterally.  Heart with slightly tachycardic but regular rhythm.  Abdomen soft,  positive bowel sounds, nontender.  No lower extremity edema.  Imaging: No results found.  Labs:  CBC: Recent Labs    11/05/20 0848 11/12/20 0919 11/20/20 0824 11/26/20 0748  WBC 1.8* 3.8* 6.3 2.3*  HGB 11.2* 10.4* 10.2* 10.8*  HCT 34.4* 32.2* 31.1* 33.4*  PLT 166 147* 360 295    COAGS: Recent Labs    12/25/19 0515  INR 1.1    BMP: Recent Labs    01/24/20 1053 01/31/20 1041 02/07/20 0948 02/14/20 0922 02/21/20 1049 11/05/20 0848 11/12/20 0919 11/20/20 0824 11/26/20 0748  NA 133* 135 138 137   < > 135 139 142 138  K 3.8 4.1 4.3 4.3   < > 3.6 4.1 3.9 3.8  CL 98 100 104 104   < > 101 105 106 102  CO2 26 31 27 26    < > 27 25 26 26   GLUCOSE 162* 82 79 78   < > 92 84 88 85  BUN 17 14 9  7*   < >  16 7* 9 11  CALCIUM 9.8 9.5 9.3 9.2   < > 8.9 9.0 9.3 9.7  CREATININE 0.81 0.67 0.66 0.68   < > 0.65 0.72 0.72 0.72  GFRNONAA >60 >60 >60 >60   < > >60 >60 >60 >60  GFRAA >60 >60 >60 >60  --   --   --   --   --    < > = values in this interval not displayed.    LIVER FUNCTION TESTS: Recent Labs    11/05/20 0848 11/12/20 0919 11/20/20 0824 11/26/20 0748  BILITOT 0.4 <0.2* <0.2* 0.5  AST 17 20 16 17   ALT 18 20 16 17   ALKPHOS 50 57 53 53  PROT 6.9 6.8 6.5 7.1  ALBUMIN 3.7 3.3* 3.3* 3.6    TUMOR MARKERS: No results for input(s): AFPTM, CEA, CA199, CHROMGRNA in the last 8760 hours.  Assessment and Plan: 65 y.o. male , ex smoker, with history of metastatic non-small cell lung cancer ,initially diagnosed as stage IIIA (Tx, N2, M0) non-small cell lung cancer, adenocarcinoma  in August 2021 and who presented with large right paratracheal soft tissue mass extending from the right apex to the right suprahilar and precarinal region.  He has undergone prior chemoradiation and immunotherapy.  He underwent stereotactic left frontoparietal craniotomy for resection of tumor on 08/30/2020.  He has poor venous access and presents today for Port-A-Cath placement to assist with  additional treatment.Risks and benefits of image guided port-a-catheter placement was discussed with the patient including, but not limited to bleeding, infection, pneumothorax, or fibrin sheath development and need for additional procedures.  All of the patient's questions were answered, patient is agreeable to proceed. Consent signed and in chart.    Thank you for this interesting consult.  I greatly enjoyed meeting [name] [name] and look forward to participating in their care.  A copy of this report was sent to the requesting provider on this date.  Electronically Signed: D. Rowe Robert, PA-C 11/30/2020, 1:16 PM   I spent a total of   25 minutes  in face to face in clinical consultation, greater than 50% of which was counseling/coordinating care for Port-A-Cath placement

## 2020-11-30 NOTE — Procedures (Signed)
Interventional Radiology Procedure Note  Procedure: Placement of a right IJ approach single lumen PowerPort.  Tip is positioned at the superior cavoatrial junction and catheter is ready for immediate use.  Complications: No immediate Recommendations:  - Ok to shower tomorrow - Do not submerge for 7 days - Routine line care   Signed,  Taelor Waymire K. Jamel Holzmann, MD   

## 2020-11-30 NOTE — Discharge Instructions (Signed)
Urgent needs - Interventional Radiology on call MD [phone]  Wound - May remove dressing and shower in 24 to 48 hours.  Keep site clean and dry.  Replace with bandaid as needed.  Do not submerge in tub or water until site healing well. If closed with glue, glue will flake off on its own.  If ordered by your provider, may start Emla cream in 2 weeks or after incision is healed.  After completion of treatment, your provider should have you set up for monthly port flushes.   

## 2020-12-03 ENCOUNTER — Encounter: Payer: Self-pay | Admitting: Internal Medicine

## 2020-12-03 ENCOUNTER — Inpatient Hospital Stay: Payer: Medicaid Other

## 2020-12-03 ENCOUNTER — Other Ambulatory Visit: Payer: Self-pay

## 2020-12-03 DIAGNOSIS — Z5112 Encounter for antineoplastic immunotherapy: Secondary | ICD-10-CM | POA: Diagnosis not present

## 2020-12-03 DIAGNOSIS — C3411 Malignant neoplasm of upper lobe, right bronchus or lung: Secondary | ICD-10-CM

## 2020-12-03 LAB — CBC WITH DIFFERENTIAL (CANCER CENTER ONLY)
Abs Immature Granulocytes: 0.08 10*3/uL — ABNORMAL HIGH (ref 0.00–0.07)
Basophils Absolute: 0 10*3/uL (ref 0.0–0.1)
Basophils Relative: 1 %
Eosinophils Absolute: 0 10*3/uL (ref 0.0–0.5)
Eosinophils Relative: 1 %
HCT: 33.2 % — ABNORMAL LOW (ref 39.0–52.0)
Hemoglobin: 10.6 g/dL — ABNORMAL LOW (ref 13.0–17.0)
Immature Granulocytes: 2 %
Lymphocytes Relative: 23 %
Lymphs Abs: 0.9 10*3/uL (ref 0.7–4.0)
MCH: 26.5 pg (ref 26.0–34.0)
MCHC: 31.9 g/dL (ref 30.0–36.0)
MCV: 83 fL (ref 80.0–100.0)
Monocytes Absolute: 0.8 10*3/uL (ref 0.1–1.0)
Monocytes Relative: 20 %
Neutro Abs: 2 10*3/uL (ref 1.7–7.7)
Neutrophils Relative %: 53 %
Platelet Count: 122 10*3/uL — ABNORMAL LOW (ref 150–400)
RBC: 4 MIL/uL — ABNORMAL LOW (ref 4.22–5.81)
RDW: 20.3 % — ABNORMAL HIGH (ref 11.5–15.5)
WBC Count: 3.8 10*3/uL — ABNORMAL LOW (ref 4.0–10.5)
nRBC: 0 % (ref 0.0–0.2)

## 2020-12-03 LAB — CMP (CANCER CENTER ONLY)
ALT: 29 U/L (ref 0–44)
AST: 24 U/L (ref 15–41)
Albumin: 3.7 g/dL (ref 3.5–5.0)
Alkaline Phosphatase: 60 U/L (ref 38–126)
Anion gap: 11 (ref 5–15)
BUN: 10 mg/dL (ref 8–23)
CO2: 25 mmol/L (ref 22–32)
Calcium: 9.4 mg/dL (ref 8.9–10.3)
Chloride: 106 mmol/L (ref 98–111)
Creatinine: 0.76 mg/dL (ref 0.61–1.24)
GFR, Estimated: >60 mL/min (ref >60)
Glucose, Bld: 72 mg/dL (ref 70–99)
Potassium: 4.1 mmol/L (ref 3.5–5.1)
Sodium: 142 mmol/L (ref 135–145)
Total Bilirubin: <0.2 mg/dL — ABNORMAL LOW (ref 0.3–1.2)
Total Protein: 7.1 g/dL (ref 6.5–8.1)

## 2020-12-04 ENCOUNTER — Encounter: Payer: Self-pay | Admitting: Internal Medicine

## 2020-12-04 NOTE — Progress Notes (Signed)
Radiation Oncology         [phone] ________________________________  Name: [name] [name] MRN: [id number]  Date: 12/05/2020  DOB: 02/09/1956  Post Treatment Note  CC: [name] Perna, NP  [name] Coss, MD  Diagnosis:   65 y.o. 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   Interval Since Last Radiation:  3 months  08/30/20 Pre-Op SRS:  The 3.2 cm target in the left parietal vertex was treated to 18 Gy in a single fraction, followed by surgical resection with Dr. Kathyrn Sheriff on 08/31/20.  01/17/20 - 03/02/20: The patient was treated to the disease within the right lung initially to a dose of 60 Gy using a 3 field, IMRT technique. The patient then received a cone down boost treatment for an additional 6 Gy. This yielded a final total dose of 66 Gy.  Narrative:  In summary, he has been under the care of Dr. 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 He was treated with concurrent chemoradiation, with daily IMRT under the care of Dr. Lisbeth Renshaw and weekly carboplatin/paclitaxel (7 cycles). He is now on consolidation treatment with immunotherapy consisting of Imfinzi every 4 weeks, started 04/09/20. His most recent chest CT on 07/02/20 showed mixed response to therapy.   More recently, he contacted Dr. Worthy Flank office on 08/23/19 with concerns regarding right upper and lower extremity weakness. He was referred to the ED at that time and underwent non-contrast head CT showing a new 3.1 cm mass lesion in left posterior parietal convexity with large area of surrounding vasogenic edema and associated mass effect but no intracranial hemorrhage.  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 Due to the aneurysm clip, he was unable to undergo MRI. They proceeded with contrast head CT showing a 3.2 cm  intra-axial mass lesion at the parietal vertex on the left with regional vasogenic edema. He met with Dr. Kathyrn Sheriff, neurosurgery, and the recommendation was to proceed with pre-op SRS followed by surgical resection of the mass which the patient was in agreement with.  Pre-op SRS was completed on 08/30/20 followed by surgical resection on 08/31/20. He tolerated the pre-op SRS well, without any acute ill side effects.  He had a posttreatment CT head on 11/29/2020 which showed substantial decrease in left cerebral edema at the vertex with residual hypoattenuation underlying the craniotomy but no nodular enhancement at this location. No evidence of recurrent or new metastatic disease.  His visit today is to review these results.                     On review of systems, the patient states that he is doing well in general. He reports occasional mild headaches that respond to Tylenol as needed.  He also continues with mild fatigue but feels that this is gradually improving.  He denies any nausea, vomiting, dizziness, changes in visual or auditory acuity, tremors or seizure activity.   He continues with an unsteady gait and weakness in the right lower extremity which is gradually improving with physical therapy.  He recently had a Port-A-Cath placed for continued systemic therapy which he feels he is tolerating well. Otherwise, he is without complaints.   ALLERGIES:  is allergic to other.  Meds: Current Outpatient Medications  Medication Sig Dispense Refill   acetaminophen (TYLENOL) 500 MG tablet Take 500  mg by mouth every 6 (six) hours as needed for mild pain or moderate pain.     budesonide-formoterol (SYMBICORT) 80-4.5 MCG/ACT inhaler Inhale 2 puffs into the lungs 2 (two) times daily. (Patient taking differently: Inhale 2 puffs into the lungs 2 (two) times daily as needed (for flares of wheezing and/or shortness of breath).) 1 Inhaler 11   feeding supplement (ENSURE ENLIVE / ENSURE PLUS) LIQD Take 237 mLs by  mouth 2 (two) times daily between meals. 932 mL 12   folic acid (FOLVITE) 1 MG tablet Take 1 tablet (1 mg total) by mouth daily. 30 tablet 4   levETIRAcetam (KEPPRA) 500 MG tablet Take 1 tablet (500 mg total) by mouth 2 (two) times daily. 60 tablet 2   Multiple Vitamin (MULTIVITAMIN) tablet Take 1 tablet by mouth daily.     pantoprazole (PROTONIX) 40 MG tablet Take 1 tablet (40 mg total) by mouth daily with supper. 30 tablet 2   prochlorperazine (COMPAZINE) 10 MG tablet Take 1 tablet (10 mg total) by mouth every 6 (six) hours as needed for nausea or vomiting. 30 tablet 0   senna (SENOKOT) 8.6 MG TABS tablet Take 1 tablet (8.6 mg total) by mouth 2 (two) times daily. 120 tablet 0   tiotropium (SPIRIVA HANDIHALER) 18 MCG inhalation capsule Place 1 capsule into inhaler and inhale daily. 30 capsule 1   VENTOLIN HFA 108 (90 Base) MCG/ACT inhaler Inhale 2 puffs into the lungs every 4 (four) hours as needed for wheezing or shortness of breath.     No current facility-administered medications for this visit.    Physical Findings:  vitals were not taken for this visit.   /In general this is a well appearing African-American male in no acute distress.  He's alert and oriented x4 and appropriate throughout the examination. Cardiopulmonary assessment is negative for acute distress and he exhibits normal effort.    Lab Findings: Lab Results  Component Value Date   WBC 3.8 (L) 12/03/2020   HGB 10.6 (L) 12/03/2020   HCT 33.2 (L) 12/03/2020   MCV 83.0 12/03/2020   PLT 122 (L) 12/03/2020     Radiographic Findings: CT Head W Wo Contrast  Result Date: 12/01/2020 CLINICAL DATA:  Metastatic lung cancer, follow-up EXAM: CT HEAD WITHOUT AND WITH CONTRAST TECHNIQUE: Contiguous axial images were obtained from the base of the skull through the vertex without and with intravenous contrast CONTRAST:  40m OMNIPAQUE IOHEXOL 350 MG/ML SOLN COMPARISON:  08/30/2020 FINDINGS: Brain: Immediate postoperative changes  have expectedly resolved. There is substantial decrease in left cerebral edema at the vertex with residual hypoattenuation underlying the craniotomy. No nodular enhancement at this location. There is no new mass or abnormal enhancement. No new loss of gray-white differentiation. Right temporal encephalomalacia. Small chronic right caudate infarct. Ventricles are stable in size without evidence of hydrocephalus. Vascular: Remote aneurysm clipping.  No new finding. Skull: High left craniotomy for mass resection. Prior right craniotomy for aneurysm clipping. Sinuses/Orbits: No new finding. Other: Opacification of right mastoid tip. IMPRESSION: No evidence of recurrent or new metastatic disease. Electronically Signed   By: PMacy MisM.D.   On: 12/01/2020 08:21   IR IMAGING GUIDED PORT INSERTION  Result Date: 11/30/2020 INDICATION: Right upper lobe lung cancer metastatic to the brain. He presents for port catheter placement to establish durable venous access. EXAM: IMPLANTED PORT A CATH PLACEMENT WITH ULTRASOUND AND FLUOROSCOPIC GUIDANCE MEDICATIONS: None. ANESTHESIA/SEDATION: Versed 4 mg IV; Fentanyl 100 mcg IV; Moderate Sedation Time:  16 minutes The  patient was continuously monitored during the procedure by the interventional radiology nurse under my direct supervision. FLUOROSCOPY TIME:  0 minutes, 30 seconds (3 mGy) COMPLICATIONS: None immediate. PROCEDURE: The right neck and chest was prepped with chlorhexidine, and draped in the usual sterile fashion using maximum barrier technique (cap and mask, sterile gown, sterile gloves, large sterile sheet, hand hygiene and cutaneous antiseptic). Local anesthesia was attained by infiltration with 1% lidocaine with epinephrine. Ultrasound demonstrated patency of the right internal jugular vein, and this was documented with an image. Under real-time ultrasound guidance, this vein was accessed with a 21 gauge micropuncture needle and image documentation was performed.  A small dermatotomy was made at the access site with an 11 scalpel. A 0.018" wire was advanced into the SVC and the access needle exchanged for a 31F micropuncture vascular sheath. The 0.018" wire was then removed and a 0.035" wire advanced into the IVC. An appropriate location for the subcutaneous reservoir was selected below the clavicle and an incision was made through the skin and underlying soft tissues. The subcutaneous tissues were then dissected using a combination of blunt and sharp surgical technique and a pocket was formed. A single lumen power injectable portacatheter was then tunneled through the subcutaneous tissues from the pocket to the dermatotomy and the port reservoir placed within the subcutaneous pocket. The venous access site was then serially dilated and a peel away vascular sheath placed over the wire. The wire was removed and the port catheter advanced into position under fluoroscopic guidance. The catheter tip is positioned in the superior cavoatrial junction. This was documented with a spot image. The portacatheter was then tested and found to flush and aspirate well. The port was flushed with saline followed by 100 units/mL heparinized saline. The pocket was then closed in two layers using first subdermal inverted interrupted absorbable sutures followed by a running subcuticular suture. The epidermis was then sealed with Dermabond. The dermatotomy at the venous access site was also closed with Dermabond. IMPRESSION: Successful placement of a right IJ approach Power Port with ultrasound and fluoroscopic guidance. The catheter is ready for use. Electronically Signed   By: Jacqulynn Cadet M.D.   On: 11/30/2020 17:24    Impression/Plan: 1. 65 y.o. 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  He has recovered well from the effects of his recent preop SRS treatment and is currently without complaints.  He continues in routine  follow-up under the care and direction of Dr. Earlie Server and was recently changed to a combo systemic therapy with carboplatin/Alimta and Keytruda every 3 weeks, currently status post 3 cycles.  We will plan to continue with serial CT head W & WO contrast every 3 months to continue to monitor for any evidence of disease recurrence or progression in the brain since the patient is unable to have MRI brain scans.  His imaging will be reviewed in the multidisciplinary brain conference and we will follow-up by phone thereafter to review results and recommendations.  He will also continue in routine follow-up under the care and direction of Dr. Earlie Server for continued management of his systemic disease.  He is scheduled for disease restaging with CT C/A/P on 12/07/2020 prior to his next follow-up visit in medical oncology on 12/10/2020.  He and his friend Vickii Chafe appear to have a good understanding of these recommendations and are in agreement with the stated plan.  They know that they are welcome to call  at anytime in the interim with any questions or concerns related to his previous radiation.    Nicholos Johns, PA-C

## 2020-12-05 ENCOUNTER — Ambulatory Visit
Admission: RE | Admit: 2020-12-05 | Discharge: 2020-12-05 | Disposition: A | Discharge status: Home / Self Care | Payer: Medicaid Other | Source: Ambulatory Visit | Attending: Urology | Admitting: Urology

## 2020-12-05 ENCOUNTER — Other Ambulatory Visit: Payer: Self-pay

## 2020-12-05 VITALS — BP 133/75 | HR 101 | Temp 97.5°F | Resp 18 | Wt 132.8 lb

## 2020-12-05 DIAGNOSIS — C7931 Secondary malignant neoplasm of brain: Secondary | ICD-10-CM

## 2020-12-05 NOTE — Progress Notes (Signed)
Patient presents to clinic for 1 month F/U a  Denies any persistent issues with headaches. States if he occasionally feels one coming on, he takes OTC Tylenol and that resolves the issues. Denies any vision changes or concern. Continues to struggle with strength/mobility to his right leg. He takes his time with ambulating and doing ADLs to ensure he doesn't lose his balance and fall. Denies any issues with nausea or changes in appetite. Continues to receive systemic therapy under the care of Dr. Curt Bears (recently had PAC placed due to  poor venous access)  Wt Readings from Last 3 Encounters:  12/05/20 132 lb 12.8 oz (60.2 kg)  11/20/20 131 lb 12.8 oz (59.8 kg)  10/31/20 131 lb (59.4 kg)   Vitals:   12/05/20 1052  BP: 133/75  Pulse: (!) 101  Resp: 18  Temp: (!) 97.5 F (36.4 C)  SpO2: 99%

## 2020-12-07 ENCOUNTER — Other Ambulatory Visit: Payer: Self-pay

## 2020-12-07 ENCOUNTER — Ambulatory Visit (HOSPITAL_COMMUNITY)
Admission: RE | Admit: 2020-12-07 | Discharge: 2020-12-07 | Disposition: A | Discharge status: Home / Self Care | Payer: Medicaid Other | Source: Ambulatory Visit | Attending: Internal Medicine | Admitting: Internal Medicine

## 2020-12-07 DIAGNOSIS — C3490 Malignant neoplasm of unspecified part of unspecified bronchus or lung: Secondary | ICD-10-CM | POA: Insufficient documentation

## 2020-12-07 MED ORDER — IOHEXOL 350 MG/ML SOLN
100.0000 mL | Freq: Once | INTRAVENOUS | Status: AC | PRN
  Administered 2020-12-07: 80 mL via INTRAVENOUS

## 2020-12-10 ENCOUNTER — Encounter: Payer: Self-pay | Admitting: Internal Medicine

## 2020-12-10 ENCOUNTER — Inpatient Hospital Stay: Payer: Medicaid Other

## 2020-12-10 ENCOUNTER — Inpatient Hospital Stay (HOSPITAL_BASED_OUTPATIENT_CLINIC_OR_DEPARTMENT_OTHER): Payer: Medicaid Other | Admitting: Internal Medicine

## 2020-12-10 ENCOUNTER — Other Ambulatory Visit: Payer: Self-pay

## 2020-12-10 ENCOUNTER — Other Ambulatory Visit: Payer: Self-pay | Admitting: Internal Medicine

## 2020-12-10 VITALS — BP 119/79 | HR 108 | Temp 97.7°F | Resp 18 | Ht 61.0 in | Wt 133.9 lb

## 2020-12-10 DIAGNOSIS — C3411 Malignant neoplasm of upper lobe, right bronchus or lung: Secondary | ICD-10-CM | POA: Diagnosis not present

## 2020-12-10 DIAGNOSIS — Z5112 Encounter for antineoplastic immunotherapy: Secondary | ICD-10-CM | POA: Diagnosis not present

## 2020-12-10 DIAGNOSIS — C7931 Secondary malignant neoplasm of brain: Secondary | ICD-10-CM

## 2020-12-10 DIAGNOSIS — Z5111 Encounter for antineoplastic chemotherapy: Secondary | ICD-10-CM

## 2020-12-10 LAB — CBC WITH DIFFERENTIAL (CANCER CENTER ONLY)
Abs Immature Granulocytes: 0.07 K/uL (ref 0.00–0.07)
Basophils Absolute: 0 K/uL (ref 0.0–0.1)
Basophils Relative: 0 %
Eosinophils Absolute: 0 K/uL (ref 0.0–0.5)
Eosinophils Relative: 1 %
HCT: 32.3 % — ABNORMAL LOW (ref 39.0–52.0)
Hemoglobin: 10.3 g/dL — ABNORMAL LOW (ref 13.0–17.0)
Immature Granulocytes: 2 %
Lymphocytes Relative: 17 %
Lymphs Abs: 0.8 K/uL (ref 0.7–4.0)
MCH: 27.1 pg (ref 26.0–34.0)
MCHC: 31.9 g/dL (ref 30.0–36.0)
MCV: 85 fL (ref 80.0–100.0)
Monocytes Absolute: 0.8 K/uL (ref 0.1–1.0)
Monocytes Relative: 16 %
Neutro Abs: 3.1 K/uL (ref 1.7–7.7)
Neutrophils Relative %: 64 %
Platelet Count: 208 K/uL (ref 150–400)
RBC: 3.8 MIL/uL — ABNORMAL LOW (ref 4.22–5.81)
RDW: 20.7 % — ABNORMAL HIGH (ref 11.5–15.5)
WBC Count: 4.8 K/uL (ref 4.0–10.5)
nRBC: 0 % (ref 0.0–0.2)

## 2020-12-10 LAB — CMP (CANCER CENTER ONLY)
ALT: 22 U/L (ref 0–44)
AST: 20 U/L (ref 15–41)
Albumin: 3.4 g/dL — ABNORMAL LOW (ref 3.5–5.0)
Alkaline Phosphatase: 61 U/L (ref 38–126)
Anion gap: 10 (ref 5–15)
BUN: 6 mg/dL — ABNORMAL LOW (ref 8–23)
CO2: 23 mmol/L (ref 22–32)
Calcium: 9 mg/dL (ref 8.9–10.3)
Chloride: 106 mmol/L (ref 98–111)
Creatinine: 0.76 mg/dL (ref 0.61–1.24)
GFR, Estimated: >60 mL/min (ref >60)
Glucose, Bld: 95 mg/dL (ref 70–99)
Potassium: 4 mmol/L (ref 3.5–5.1)
Sodium: 139 mmol/L (ref 135–145)
Total Bilirubin: 0.3 mg/dL (ref 0.3–1.2)
Total Protein: 6.7 g/dL (ref 6.5–8.1)

## 2020-12-10 LAB — TSH: TSH: 1.671 u[IU]/mL (ref 0.320–4.118)

## 2020-12-10 MED ORDER — DIPHENHYDRAMINE HCL 25 MG PO CAPS
ORAL_CAPSULE | ORAL | Status: AC
  Filled 2020-12-10: qty 1

## 2020-12-10 MED ORDER — HEPARIN SOD (PORK) LOCK FLUSH 100 UNIT/ML IV SOLN
500.0000 [IU] | Freq: Once | INTRAVENOUS | Status: AC | PRN
  Administered 2020-12-10: 500 [IU]
  Filled 2020-12-10: qty 5

## 2020-12-10 MED ORDER — PALONOSETRON HCL INJECTION 0.25 MG/5ML
INTRAVENOUS | Status: AC
  Filled 2020-12-10: qty 5

## 2020-12-10 MED ORDER — SODIUM CHLORIDE 0.9 % IV SOLN
522.0000 mg | Freq: Once | INTRAVENOUS | Status: AC
  Administered 2020-12-10: 520 mg via INTRAVENOUS
  Filled 2020-12-10: qty 52

## 2020-12-10 MED ORDER — SODIUM CHLORIDE 0.9 % IV SOLN
200.0000 mg | Freq: Once | INTRAVENOUS | Status: AC
  Administered 2020-12-10: 200 mg via INTRAVENOUS
  Filled 2020-12-10: qty 8

## 2020-12-10 MED ORDER — SODIUM CHLORIDE 0.9 % IV SOLN
150.0000 mg | Freq: Once | INTRAVENOUS | Status: AC
  Administered 2020-12-10: 150 mg via INTRAVENOUS
  Filled 2020-12-10: qty 150

## 2020-12-10 MED ORDER — PALONOSETRON HCL INJECTION 0.25 MG/5ML
0.2500 mg | Freq: Once | INTRAVENOUS | Status: AC
  Administered 2020-12-10: 0.25 mg via INTRAVENOUS

## 2020-12-10 MED ORDER — SODIUM CHLORIDE 0.9% FLUSH
10.0000 mL | INTRAVENOUS | Status: DC | PRN
  Administered 2020-12-10: 10 mL
  Filled 2020-12-10: qty 10

## 2020-12-10 MED ORDER — SODIUM CHLORIDE 0.9 % IV SOLN
10.0000 mg | Freq: Once | INTRAVENOUS | Status: AC
  Administered 2020-12-10: 10 mg via INTRAVENOUS
  Filled 2020-12-10: qty 10

## 2020-12-10 MED ORDER — FAMOTIDINE 20 MG IN NS 100 ML IVPB
INTRAVENOUS | Status: AC
  Filled 2020-12-10: qty 100

## 2020-12-10 MED ORDER — FAMOTIDINE 20 MG IN NS 100 ML IVPB
20.0000 mg | Freq: Once | INTRAVENOUS | Status: AC
  Administered 2020-12-10: 20 mg via INTRAVENOUS

## 2020-12-10 MED ORDER — SODIUM CHLORIDE 0.9 % IV SOLN
Freq: Once | INTRAVENOUS | Status: AC
  Filled 2020-12-10: qty 250

## 2020-12-10 MED ORDER — DIPHENHYDRAMINE HCL 25 MG PO CAPS
25.0000 mg | ORAL_CAPSULE | Freq: Once | ORAL | Status: AC
  Administered 2020-12-10: 25 mg via ORAL

## 2020-12-10 MED ORDER — SODIUM CHLORIDE 0.9 % IV SOLN
500.0000 mg/m2 | Freq: Once | INTRAVENOUS | Status: AC
  Administered 2020-12-10: 800 mg via INTRAVENOUS
  Filled 2020-12-10: qty 20

## 2020-12-10 NOTE — Progress Notes (Signed)
Okay to treat with elevated pulse per Dr. Julien Nordmann

## 2020-12-10 NOTE — Patient Instructions (Addendum)
[facility]  Discharge Instructions: Thank you for choosing [facility] to provide your oncology and hematology care.   If you have a lab appointment with the [facility], please go directly to the [facility] and check in at the registration area.   Wear comfortable clothing and clothing appropriate for easy access to any Portacath or PICC line.   We strive to give you quality time with your provider. You may need to reschedule your appointment if you arrive late (15 or more minutes).  Arriving late affects you and other patients whose appointments are after yours.  Also, if you miss three or more appointments without notifying the office, you may be dismissed from the clinic at the provider's discretion.      For prescription refill requests, have your pharmacy contact our office and allow 72 hours for refills to be completed.    Today you received the following chemotherapy and/or immunotherapy agents: Keytruda, Alimta, Carboplatin   To help prevent nausea and vomiting after your treatment, we encourage you to take your nausea medication as directed.  BELOW ARE SYMPTOMS THAT SHOULD BE REPORTED IMMEDIATELY: *FEVER GREATER THAN 100.4 F (38 C) OR HIGHER *CHILLS OR SWEATING *NAUSEA AND VOMITING THAT IS NOT CONTROLLED WITH YOUR NAUSEA MEDICATION *UNUSUAL SHORTNESS OF BREATH *UNUSUAL BRUISING OR BLEEDING *URINARY PROBLEMS (pain or burning when urinating, or frequent urination) *BOWEL PROBLEMS (unusual diarrhea, constipation, pain near the anus) TENDERNESS IN MOUTH AND THROAT WITH OR WITHOUT PRESENCE OF ULCERS (sore throat, sores in mouth, or a toothache) UNUSUAL RASH, SWELLING OR PAIN  UNUSUAL VAGINAL DISCHARGE OR ITCHING   Items with * indicate a potential emergency and should be followed up as soon as possible or go to the Emergency Department if any problems should occur.  Please show the CHEMOTHERAPY ALERT CARD or IMMUNOTHERAPY ALERT  CARD at check-in to the Emergency Department and triage nurse.  Should you have questions after your visit or need to cancel or reschedule your appointment, please contact [facility]  Dept: [phone]  and follow the prompts.  Office hours are 8:00 a.m. to 4:30 p.m. Monday - Friday. Please note that voicemails left after 4:00 p.m. may not be returned until the following business day.  We are closed weekends and major holidays. You have access to a nurse at all times for urgent questions. Please call the main number to the clinic Dept: [phone] and follow the prompts.   For any non-urgent questions, you may also contact your provider using MyChart. We now offer e-Visits for anyone 65 and older to request care online for non-urgent symptoms. For details visit [email or web address].   Also download the MyChart app! Go to the app store, search "MyChart", open the app, select [facility], and log in with your MyChart username and password.  Due to Covid, a mask is required upon entering the hospital/clinic. If you do not have a mask, one will be given to you upon arrival. For doctor visits, patients may have 1 support person aged 26 or older with them. For treatment visits, patients cannot have anyone with them due to current Covid guidelines and our immunocompromised population.   Implanted Port Insertion, Care After This sheet gives you information about how to care for yourself after your procedure. Your health care provider may also give you more specific instructions. If you have problems or questions, contact your health careprovider. What can I expect after the procedure? After the procedure,  it is common to have: Discomfort at the port insertion site. Bruising on the skin over the port. This should improve over 3-4 days. Follow these instructions at home: [facility] care After your port is placed, you will get a manufacturer's information card. The card has  information about your port. Keep this card with you at all times. Take care of the port as told by your health care provider. Ask your health care provider if you or a family member can get training for taking care of the port at home. A home health care nurse may also take care of the port. Make sure to remember what type of port you have. Incision care     Follow instructions from your health care provider about how to take care of your port insertion site. Make sure you: Wash your hands with soap and water before and after you change your bandage (dressing). If soap and water are not available, use hand sanitizer. Change your dressing as told by your health care provider. Leave stitches (sutures), skin glue, or adhesive strips in place. These skin closures may need to stay in place for 2 weeks or longer. If adhesive strip edges start to loosen and curl up, you may trim the loose edges. Do not remove adhesive strips completely unless your health care provider tells you to do that. Check your port insertion site every day for signs of infection. Check for: Redness, swelling, or pain. Fluid or blood. Warmth. Pus or a bad smell. Activity Return to your normal activities as told by your health care provider. Ask your health care provider what activities are safe for you. Do not lift anything that is heavier than 10 lb (4.5 kg), or the limit that you are told, until your health care provider says that it is safe. General instructions Take over-the-counter and prescription medicines only as told by your health care provider. Do not take baths, swim, or use a hot tub until your health care provider approves. Ask your health care provider if you may take showers. You may only be allowed to take sponge baths. Do not drive for 24 hours if you were given a sedative during your procedure. Wear a medical alert bracelet in case of an emergency. This will tell any health care providers that you have a  port. Keep all follow-up visits as told by your health care provider. This is important. Contact a health care provider if: You cannot flush your port with saline as directed, or you cannot draw blood from the port. You have a fever or chills. You have redness, swelling, or pain around your port insertion site. You have fluid or blood coming from your port insertion site. Your port insertion site feels warm to the touch. You have pus or a bad smell coming from the port insertion site. Get help right away if: You have chest pain or shortness of breath. You have bleeding from your port that you cannot control. Summary Take care of the port as told by your health care provider. Keep the manufacturer's information card with you at all times. Change your dressing as told by your health care provider. Contact a health care provider if you have a fever or chills or if you have redness, swelling, or pain around your port insertion site. Keep all follow-up visits as told by your health care provider. This information is not intended to replace advice given to you by your health care provider. Make sure you discuss any questions  you have with your healthcare provider. Document Revised: 12/01/2017 Document Reviewed: 12/01/2017 Elsevier Patient Education  [place].

## 2020-12-10 NOTE — Progress Notes (Signed)
.[place] [phone]   [phone]  OFFICE PROGRESS NOTE  [name], NP [id number] [place] [id number]  DIAGNOSIS: Metastatic non-small cell lung cancer initially diagnosed as stage IIIA (Tx, N2, M0) non-small cell lung cancer, adenocarcinoma diagnosed in August 2021 and presented with large right paratracheal soft tissue mass extending from the right apex to the right suprahilar and precarinal region.  Molecular studies by [employer]:  [id number], 31.9%, Olaparib  [id number], 68.6%. None   BRAFAmplification, High (+++) Plasma Copy Number: 5.0  PRIOR THERAPY:  1) Concurrent chemoradiation with weekly carboplatin for AUC of 2 and paclitaxel 45 mg/M2.  First dose January 17, 2020.  Status post 7 cycles.  Last dose was given on February 28, 2020 2) Consolidation treatment with immunotherapy with Imfinzi 1500 mg IV every 4 weeks.  First dose April 09, 2020.  Status post 5 cycles.  Last dose was given July 30, 2020 discontinued secondary to disease progression with brain metastasis. 3) Stereotactic left frontoparietal craniotomy for resection of tumor on 08/30/2020.   CURRENT THERAPY: First-line systemic chemotherapy with carboplatin for AUC of 5, Alimta 500 Mg/M2 and Keytruda 200 Mg IV every 3 weeks.  First dose Oct 08, 2020.  Status post 3 cycles.  INTERVAL HISTORY: [name] [name] 65 y.o. male returns to the clinic today for follow-up visit.  The patient is feeling fine today with no concerning complaints except for mild fatigue.  He denied having any chest pain, shortness of breath, cough or hemoptysis.  He denied having any nausea, vomiting, diarrhea or constipation.  He has no headache or visual changes.  He has no recent weight loss or night sweats.  He continues to have weakness and the lower extremities especially on the right side after his brain tumor resection in February 2022 and he uses a walker.  The patient  had repeat CT scan of the chest, abdomen pelvis performed recently and he is here for evaluation and discussion of his scan results.  MEDICAL HISTORY: Past Medical History:  Diagnosis Date   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[place]) 12/25/2019   Cancer ([place])    COPD (chronic obstructive pulmonary disease) ([place]) 11/02/2007   Qualifier: Diagnosis of  By: Melvyn Novas MD, Christena Deem    Hypertension 12/24/2019   Iron deficiency anemia 08/23/2007   Qualifier: Diagnosis of  By: Jim Like      ALLERGIES:  is allergic to other.  MEDICATIONS:  Current Outpatient Medications  Medication Sig Dispense Refill   acetaminophen (TYLENOL) 500 MG tablet Take 500 mg by mouth every 6 (six) hours as needed for mild pain or moderate pain.     budesonide-formoterol (SYMBICORT) 80-4.5 MCG/ACT inhaler Inhale 2 puffs into the lungs 2 (two) times daily. (Patient taking differently: Inhale 2 puffs into the lungs 2 (two) times daily as needed (for flares of wheezing and/or shortness of breath).) 1 Inhaler 11   feeding supplement (ENSURE ENLIVE / ENSURE PLUS) LIQD Take 237 mLs by mouth 2 (two) times daily between meals. 694 mL 12   folic acid (FOLVITE) 1 MG tablet Take 1 tablet (1 mg total) by mouth daily. 30 tablet 4   Multiple Vitamin (MULTIVITAMIN) tablet Take 1 tablet by mouth daily.     pantoprazole (PROTONIX) 40 MG tablet Take 1 tablet (40 mg total) by mouth daily with supper. 30 tablet 2   prochlorperazine (COMPAZINE) 10 MG tablet Take 1 tablet (10 mg total) by mouth every 6 (six) hours as  needed for nausea or vomiting. 30 tablet 0   senna (SENOKOT) 8.6 MG TABS tablet Take 1 tablet (8.6 mg total) by mouth 2 (two) times daily. 120 tablet 0   tiotropium (SPIRIVA HANDIHALER) 18 MCG inhalation capsule Place 1 capsule into inhaler and inhale daily. 30 capsule 1   VENTOLIN HFA 108 (90 Base) MCG/ACT inhaler Inhale 2 puffs into the lungs every 4 (four) hours as needed for wheezing or shortness of breath.     levETIRAcetam (KEPPRA) 500 MG  tablet Take 1 tablet (500 mg total) by mouth 2 (two) times daily. (Patient not taking: Reported on 12/10/2020) 60 tablet 2   No current facility-administered medications for this visit.    SURGICAL HISTORY:  Past Surgical History:  Procedure Laterality Date   APPLICATION OF CRANIAL NAVIGATION N/A 08/30/2020   Procedure: APPLICATION OF CRANIAL NAVIGATION;  Surgeon: Consuella Lose, MD;  Location: [facility];  Service: Neurosurgery;  Laterality: N/A;   CRANIOTOMY Left 08/30/2020   Procedure: Stereotactic left frontoparietal craniectomy for resection of tumor with brainlab;  Surgeon: Consuella Lose, MD;  Location: [facility];  Service: Neurosurgery;  Laterality: Left;   ENDOBRONCHIAL ULTRASOUND N/A 12/27/2019   Procedure: ENDOBRONCHIAL ULTRASOUND;  Surgeon: Laurin Coder, MD;  Location: [facility];  Service: Endoscopy;  Laterality: N/A;   FINE NEEDLE ASPIRATION  12/27/2019   Procedure: FINE NEEDLE ASPIRATION;  Surgeon: Laurin Coder, MD;  Location: [facility];  Service: Endoscopy;;   IR IMAGING GUIDED PORT INSERTION  11/30/2020   VIDEO BRONCHOSCOPY N/A 12/27/2019   Procedure: VIDEO BRONCHOSCOPY WITHOUT FLUORO;  Surgeon: Laurin Coder, MD;  Location: [facility];  Service: Endoscopy;  Laterality: N/A;    REVIEW OF SYSTEMS:  Constitutional: positive for fatigue Eyes: negative Ears, nose, mouth, throat, and face: negative Respiratory: negative Cardiovascular: negative Gastrointestinal: negative Genitourinary:negative Integument/breast: negative Hematologic/lymphatic: negative Musculoskeletal:positive for muscle weakness Neurological: negative Behavioral/Psych: negative Endocrine: negative Allergic/Immunologic: negative   PHYSICAL EXAMINATION: General appearance: alert, cooperative, fatigued, and no distress Head: Normocephalic, without obvious abnormality, atraumatic Neck: no adenopathy, no JVD, supple, symmetrical, trachea midline, and thyroid not enlarged, symmetric, no  tenderness/mass/nodules Lymph nodes: Cervical, supraclavicular, and axillary nodes normal. Resp: clear to auscultation bilaterally Back: symmetric, no curvature. ROM normal. No CVA tenderness. Cardio: regular rate and rhythm, S1, S2 normal, no murmur, click, rub or gallop GI: soft, non-tender; bowel sounds normal; no masses,  no organomegaly Extremities: extremities normal, atraumatic, no cyanosis or edema Neurologic: Alert and oriented X 3, normal strength and tone. Normal symmetric reflexes. Normal coordination and gait  ECOG PERFORMANCE STATUS: 1 - Symptomatic but completely ambulatory  Blood pressure 119/79, pulse (!) 108, temperature 97.7 F (36.5 C), temperature source Tympanic, resp. rate 18, height 5\' 1"  (1.549 m), weight 133 lb 14.4 oz (60.7 kg), SpO2 97 %.  LABORATORY DATA: Lab Results  Component Value Date   WBC 4.8 12/10/2020   HGB 10.3 (L) 12/10/2020   HCT 32.3 (L) 12/10/2020   MCV 85.0 12/10/2020   PLT 208 12/10/2020      Chemistry      Component Value Date/Time   NA 142 12/03/2020 0737   NA 144 08/03/2019 1527   K 4.1 12/03/2020 0737   CL 106 12/03/2020 0737   CO2 25 12/03/2020 0737   BUN 10 12/03/2020 0737   BUN 9 08/03/2019 1527   CREATININE 0.76 12/03/2020 0737      Component Value Date/Time   CALCIUM 9.4 12/03/2020 0737   ALKPHOS 60 12/03/2020 0737   AST 24  12/03/2020 0737   ALT 29 12/03/2020 0737   BILITOT <0.2 (L) 12/03/2020 0737       RADIOGRAPHIC STUDIES: CT Head W Wo Contrast  Result Date: 12/01/2020 CLINICAL DATA:  Metastatic lung cancer, follow-up EXAM: CT HEAD WITHOUT AND WITH CONTRAST TECHNIQUE: Contiguous axial images were obtained from the base of the skull through the vertex without and with intravenous contrast CONTRAST:  68mL OMNIPAQUE IOHEXOL 350 MG/ML SOLN COMPARISON:  08/30/2020 FINDINGS: Brain: Immediate postoperative changes have expectedly resolved. There is substantial decrease in left cerebral edema at the vertex with residual  hypoattenuation underlying the craniotomy. No nodular enhancement at this location. There is no new mass or abnormal enhancement. No new loss of gray-white differentiation. Right temporal encephalomalacia. Small chronic right caudate infarct. Ventricles are stable in size without evidence of hydrocephalus. Vascular: Remote aneurysm clipping.  No new finding. Skull: High left craniotomy for mass resection. Prior right craniotomy for aneurysm clipping. Sinuses/Orbits: No new finding. Other: Opacification of right mastoid tip. IMPRESSION: No evidence of recurrent or new metastatic disease. Electronically Signed   By: Macy Mis M.D.   On: 12/01/2020 08:21   CT Chest W Contrast  Result Date: 12/09/2020 CLINICAL DATA:  Non-small cell lung cancer restaging. Previous metastatic disease to the brain. Ongoing chemotherapy. EXAM: CT CHEST, ABDOMEN, AND PELVIS WITH CONTRAST TECHNIQUE: Multidetector CT imaging of the chest, abdomen and pelvis was performed following the standard protocol during bolus administration of intravenous contrast. CONTRAST:  39mL OMNIPAQUE IOHEXOL 350 MG/ML SOLN COMPARISON:  10/11/2020 FINDINGS: CT CHEST FINDINGS Cardiovascular: Right Port-A-Cath tip: Lower SVC. Small anterior pericardial effusion. Atherosclerotic calcification of the aortic arch. Mediastinum/Nodes: There continues to be indistinctness of tissue planes along the right side of the trachea and esophagus related to prior tumor extension along the mediastinum and regional therapy. This is not readily measurable separate from the pleural-based tumor at the right lung apex medially, which itself measures about 3.0 by 2.5 cm on image 12 series 2, previously 3.1 by 2.6 cm by my measurements. Lungs/Pleura: Emphysema. Airway thickening is present, suggesting bronchitis or reactive airways disease. Right lower lobe pulmonary nodule measures 1.0 by 0.9 cm on image 79 series 4, formerly 1.0 By 0.8 cm by my measurements. The left lower lobe  nodule on image 51 series 4 measures 1.1 by 0.9 cm, formerly 1.0 by 0.9 cm by my measurements. The left lower lobe nodule on image 82 series 4 measures 2.3 by 2.1 cm, previously 2.6 by 2.4 cm. Adjacent left lower lobe nodule measures 1.9 by 1.4 cm on image 90 series 4, previously 2.2 by 1.7 cm. Thick-walled cystic/cavitary process anteriorly in the left upper lobe unchanged. Musculoskeletal: Unremarkable CT ABDOMEN PELVIS FINDINGS Hepatobiliary: Stable small hypodense lesion anteriorly in segment 3 of the liver on image 54 series 2, likely benign. Gallbladder unremarkable. Pancreas: Unremarkable Spleen: Unremarkable Adrenals/Urinary Tract: Unremarkable Stomach/Bowel: Unremarkable Vascular/Lymphatic: Mild aortoiliac atherosclerotic calcification. Reproductive: Mild prostatomegaly with the prostate gland indenting the bladder base. Other: No supplemental non-categorized findings. Musculoskeletal: Stable sclerotic sharply defined lesion in the right iliac bone on image 80 of series 2, probably a bone island, not hypermetabolic on prior PET-CT of 01/11/2020. Chronic pars defects at L5 with 9 mm anterolisthesis of L5 on S1 and disc uncovering resulting in right greater than left foraminal impingement at L5-S1. IMPRESSION: 1. The two dominant left lower lobe nodules are mildly reduced in size, other nodules are essentially stable. The pleural-based lesion along the right medial apex with indistinct margination along the mediastinum  is essentially stable as well. No new worrisome lesions identified. 2. Other imaging findings of potential clinical significance: Small anterior pericardial effusion. Aortic Atherosclerosis (ICD10-I70.0). Emphysema (ICD10-J43.9). Airway thickening is present, suggesting bronchitis or reactive airways disease. Prostatomegaly. Chronic pars defects at L5 with right greater than left foraminal impingement at L5-S1. Electronically Signed   By: Van Clines M.D.   On: 12/09/2020 12:56   CT  Abdomen Pelvis W Contrast  Result Date: 12/09/2020 CLINICAL DATA:  Non-small cell lung cancer restaging. Previous metastatic disease to the brain. Ongoing chemotherapy. EXAM: CT CHEST, ABDOMEN, AND PELVIS WITH CONTRAST TECHNIQUE: Multidetector CT imaging of the chest, abdomen and pelvis was performed following the standard protocol during bolus administration of intravenous contrast. CONTRAST:  58mL OMNIPAQUE IOHEXOL 350 MG/ML SOLN COMPARISON:  10/11/2020 FINDINGS: CT CHEST FINDINGS Cardiovascular: Right Port-A-Cath tip: Lower SVC. Small anterior pericardial effusion. Atherosclerotic calcification of the aortic arch. Mediastinum/Nodes: There continues to be indistinctness of tissue planes along the right side of the trachea and esophagus related to prior tumor extension along the mediastinum and regional therapy. This is not readily measurable separate from the pleural-based tumor at the right lung apex medially, which itself measures about 3.0 by 2.5 cm on image 12 series 2, previously 3.1 by 2.6 cm by my measurements. Lungs/Pleura: Emphysema. Airway thickening is present, suggesting bronchitis or reactive airways disease. Right lower lobe pulmonary nodule measures 1.0 by 0.9 cm on image 79 series 4, formerly 1.0 By 0.8 cm by my measurements. The left lower lobe nodule on image 51 series 4 measures 1.1 by 0.9 cm, formerly 1.0 by 0.9 cm by my measurements. The left lower lobe nodule on image 82 series 4 measures 2.3 by 2.1 cm, previously 2.6 by 2.4 cm. Adjacent left lower lobe nodule measures 1.9 by 1.4 cm on image 90 series 4, previously 2.2 by 1.7 cm. Thick-walled cystic/cavitary process anteriorly in the left upper lobe unchanged. Musculoskeletal: Unremarkable CT ABDOMEN PELVIS FINDINGS Hepatobiliary: Stable small hypodense lesion anteriorly in segment 3 of the liver on image 54 series 2, likely benign. Gallbladder unremarkable. Pancreas: Unremarkable Spleen: Unremarkable Adrenals/Urinary Tract: Unremarkable  Stomach/Bowel: Unremarkable Vascular/Lymphatic: Mild aortoiliac atherosclerotic calcification. Reproductive: Mild prostatomegaly with the prostate gland indenting the bladder base. Other: No supplemental non-categorized findings. Musculoskeletal: Stable sclerotic sharply defined lesion in the right iliac bone on image 80 of series 2, probably a bone island, not hypermetabolic on prior PET-CT of 01/11/2020. Chronic pars defects at L5 with 9 mm anterolisthesis of L5 on S1 and disc uncovering resulting in right greater than left foraminal impingement at L5-S1. IMPRESSION: 1. The two dominant left lower lobe nodules are mildly reduced in size, other nodules are essentially stable. The pleural-based lesion along the right medial apex with indistinct margination along the mediastinum is essentially stable as well. No new worrisome lesions identified. 2. Other imaging findings of potential clinical significance: Small anterior pericardial effusion. Aortic Atherosclerosis (ICD10-I70.0). Emphysema (ICD10-J43.9). Airway thickening is present, suggesting bronchitis or reactive airways disease. Prostatomegaly. Chronic pars defects at L5 with right greater than left foraminal impingement at L5-S1. Electronically Signed   By: Van Clines M.D.   On: 12/09/2020 12:56   IR IMAGING GUIDED PORT INSERTION  Result Date: 11/30/2020 INDICATION: Right upper lobe lung cancer metastatic to the brain. He presents for port catheter placement to establish durable venous access. EXAM: IMPLANTED PORT A CATH PLACEMENT WITH ULTRASOUND AND FLUOROSCOPIC GUIDANCE MEDICATIONS: None. ANESTHESIA/SEDATION: Versed 4 mg IV; Fentanyl 100 mcg IV; Moderate Sedation Time:  16 minutes  The patient was continuously monitored during the procedure by the interventional radiology nurse under my direct supervision. FLUOROSCOPY TIME:  0 minutes, 30 seconds (3 mGy) COMPLICATIONS: None immediate. PROCEDURE: The right neck and chest was prepped with  chlorhexidine, and draped in the usual sterile fashion using maximum barrier technique (cap and mask, sterile gown, sterile gloves, large sterile sheet, hand hygiene and cutaneous antiseptic). Local anesthesia was attained by infiltration with 1% lidocaine with epinephrine. Ultrasound demonstrated patency of the right internal jugular vein, and this was documented with an image. Under real-time ultrasound guidance, this vein was accessed with a 21 gauge micropuncture needle and image documentation was performed. A small dermatotomy was made at the access site with an 11 scalpel. A 0.018" wire was advanced into the SVC and the access needle exchanged for a 53F micropuncture vascular sheath. The 0.018" wire was then removed and a 0.035" wire advanced into the IVC. An appropriate location for the subcutaneous reservoir was selected below the clavicle and an incision was made through the skin and underlying soft tissues. The subcutaneous tissues were then dissected using a combination of blunt and sharp surgical technique and a pocket was formed. A single lumen power injectable portacatheter was then tunneled through the subcutaneous tissues from the pocket to the dermatotomy and the port reservoir placed within the subcutaneous pocket. The venous access site was then serially dilated and a peel away vascular sheath placed over the wire. The wire was removed and the port catheter advanced into position under fluoroscopic guidance. The catheter tip is positioned in the superior cavoatrial junction. This was documented with a spot image. The portacatheter was then tested and found to flush and aspirate well. The port was flushed with saline followed by 100 units/mL heparinized saline. The pocket was then closed in two layers using first subdermal inverted interrupted absorbable sutures followed by a running subcuticular suture. The epidermis was then sealed with Dermabond. The dermatotomy at the venous access site was also  closed with Dermabond. IMPRESSION: Successful placement of a right IJ approach Power Port with ultrasound and fluoroscopic guidance. The catheter is ready for use. Electronically Signed   By: Jacqulynn Cadet M.D.   On: 11/30/2020 17:24    ASSESSMENT AND PLAN: This is a very pleasant 65 years old African-American male recently diagnosed with metastatic non-small cell lung cancer that was initially diagnosed as stage IIIA (Tx, N2, M0) non-small cell lung cancer, adenocarcinoma presented with large right paratracheal soft tissue mass extending from the right apex to the right suprahilar and precarinal region diagnosed in August 2021.  The patient developed brain metastasis in April 2022. Molecular studies by [employer] 360 showed no actionable mutations. The patient completed a course of concurrent chemoradiation with weekly carboplatin for AUC of 2 and paclitaxel 45 mg/M2.  He is status post 7 cycles.  Last dose was given February 28, 2020 The patient tolerated the previous treatment well and he had partial response. The patient is currently undergoing consolidation treatment with immunotherapy with Imfinzi 1500 mg IV every 4 weeks status post 5 cycles. 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 The patient is here today to start the first cycle of systemic chemotherapy with carboplatin for AUC of 5, Alimta 500 Mg/M2 and Keytruda 200 Mg IV every 3 weeks.  Status post 3 cycles. The patient continues to tolerate his treatment fairly well with no concerning adverse effects. He had repeat CT scan of the chest, abdomen  pelvis performed recently.  I personally and independently reviewed the scans and discussed the results with the patient today. His scan showed no concerning findings for disease progression and there was some improvement of of the 2 dominant left lower lobe lung nodules. I recommended for the patient to continue his current treatment and he  will proceed with cycle #4 today. I will see him back for follow-up visit in 3 weeks for evaluation before starting cycle #5. I will send the prescription of Emla cream to his pharmacy to be applied to the Port-A-Cath site before treatment. The patient was advised to call immediately if he has any other concerning symptoms in the interval. The patient voices understanding of current disease status and treatment options and is in agreement with the current care plan.  All questions were answered. The patient knows to call the clinic with any problems, questions or concerns. We can certainly see the patient much sooner if necessary.  Disclaimer: This note was dictated with voice recognition software. Similar sounding words can inadvertently be transcribed and may not be corrected upon review.

## 2020-12-10 NOTE — Progress Notes (Signed)
Shower Administrator, Civil [employer] chair ordered for pt through [employer]

## 2020-12-12 ENCOUNTER — Other Ambulatory Visit: Payer: Self-pay

## 2020-12-14 ENCOUNTER — Encounter: Payer: Self-pay | Admitting: Internal Medicine

## 2020-12-17 ENCOUNTER — Inpatient Hospital Stay: Payer: Medicaid Other | Attending: Internal Medicine

## 2020-12-17 ENCOUNTER — Telehealth: Payer: Self-pay | Admitting: Medical Oncology

## 2020-12-17 ENCOUNTER — Inpatient Hospital Stay: Payer: Medicaid Other

## 2020-12-17 ENCOUNTER — Telehealth: Payer: Self-pay

## 2020-12-17 ENCOUNTER — Other Ambulatory Visit: Payer: Self-pay | Admitting: Physician Assistant

## 2020-12-17 ENCOUNTER — Other Ambulatory Visit: Payer: Self-pay

## 2020-12-17 ENCOUNTER — Telehealth: Payer: Self-pay | Admitting: Physician Assistant

## 2020-12-17 DIAGNOSIS — Z5112 Encounter for antineoplastic immunotherapy: Secondary | ICD-10-CM | POA: Insufficient documentation

## 2020-12-17 DIAGNOSIS — C3491 Malignant neoplasm of unspecified part of right bronchus or lung: Secondary | ICD-10-CM | POA: Insufficient documentation

## 2020-12-17 DIAGNOSIS — C7931 Secondary malignant neoplasm of brain: Secondary | ICD-10-CM | POA: Insufficient documentation

## 2020-12-17 DIAGNOSIS — Z5111 Encounter for antineoplastic chemotherapy: Secondary | ICD-10-CM | POA: Diagnosis present

## 2020-12-17 DIAGNOSIS — D709 Neutropenia, unspecified: Secondary | ICD-10-CM

## 2020-12-17 DIAGNOSIS — Z79899 Other long term (current) drug therapy: Secondary | ICD-10-CM | POA: Insufficient documentation

## 2020-12-17 DIAGNOSIS — C3411 Malignant neoplasm of upper lobe, right bronchus or lung: Secondary | ICD-10-CM

## 2020-12-17 DIAGNOSIS — I10 Essential (primary) hypertension: Secondary | ICD-10-CM | POA: Diagnosis not present

## 2020-12-17 DIAGNOSIS — Z452 Encounter for adjustment and management of vascular access device: Secondary | ICD-10-CM | POA: Insufficient documentation

## 2020-12-17 LAB — CMP (CANCER CENTER ONLY)
ALT: 16 U/L (ref 0–44)
AST: 21 U/L (ref 15–41)
Albumin: 3.6 g/dL (ref 3.5–5.0)
Alkaline Phosphatase: 58 U/L (ref 38–126)
Anion gap: 9 (ref 5–15)
BUN: 8 mg/dL (ref 8–23)
CO2: 27 mmol/L (ref 22–32)
Calcium: 9.4 mg/dL (ref 8.9–10.3)
Chloride: 101 mmol/L (ref 98–111)
Creatinine: 0.7 mg/dL (ref 0.61–1.24)
GFR, Estimated: >60 mL/min (ref >60)
Glucose, Bld: 94 mg/dL (ref 70–99)
Potassium: 3.8 mmol/L (ref 3.5–5.1)
Sodium: 137 mmol/L (ref 135–145)
Total Bilirubin: 0.4 mg/dL (ref 0.3–1.2)
Total Protein: 6.9 g/dL (ref 6.5–8.1)

## 2020-12-17 LAB — CBC WITH DIFFERENTIAL (CANCER CENTER ONLY)
Abs Immature Granulocytes: 0 10*3/uL (ref 0.00–0.07)
Basophils Absolute: 0 10*3/uL (ref 0.0–0.1)
Basophils Relative: 1 %
Eosinophils Absolute: 0 10*3/uL (ref 0.0–0.5)
Eosinophils Relative: 1 %
HCT: 28.7 % — ABNORMAL LOW (ref 39.0–52.0)
Hemoglobin: 9.5 g/dL — ABNORMAL LOW (ref 13.0–17.0)
Immature Granulocytes: 0 %
Lymphocytes Relative: 41 %
Lymphs Abs: 0.6 10*3/uL — ABNORMAL LOW (ref 0.7–4.0)
MCH: 27.5 pg (ref 26.0–34.0)
MCHC: 33.1 g/dL (ref 30.0–36.0)
MCV: 83.2 fL (ref 80.0–100.0)
Monocytes Absolute: 0.3 10*3/uL (ref 0.1–1.0)
Monocytes Relative: 19 %
Neutro Abs: 0.6 10*3/uL — ABNORMAL LOW (ref 1.7–7.7)
Neutrophils Relative %: 38 %
Platelet Count: 145 10*3/uL — ABNORMAL LOW (ref 150–400)
RBC: 3.45 MIL/uL — ABNORMAL LOW (ref 4.22–5.81)
RDW: 18.8 % — ABNORMAL HIGH (ref 11.5–15.5)
WBC Count: 1.4 10*3/uL — ABNORMAL LOW (ref 4.0–10.5)
nRBC: 0 % (ref 0.0–0.2)

## 2020-12-17 MED ORDER — LIDOCAINE-PRILOCAINE 2.5-2.5 % EX CREA: 1.0000 "application " | TOPICAL_CREAM | CUTANEOUS | 1 refills | Status: AC | PRN

## 2020-12-17 MED ORDER — SODIUM CHLORIDE 0.9% FLUSH
10.0000 mL | INTRAVENOUS | Status: DC | PRN
  Administered 2020-12-17: 10 mL via INTRAVENOUS
  Filled 2020-12-17: qty 10

## 2020-12-17 MED ORDER — HEPARIN SOD (PORK) LOCK FLUSH 100 UNIT/ML IV SOLN
500.0000 [IU] | Freq: Once | INTRAVENOUS | Status: AC
  Administered 2020-12-17: 500 [IU] via INTRAVENOUS
  Filled 2020-12-17: qty 5

## 2020-12-17 MED ORDER — LIDOCAINE-PRILOCAINE 2.5-2.5 % EX CREA
1.0000 "application " | TOPICAL_CREAM | CUTANEOUS | 1 refills | Status: DC | PRN
  Filled 2020-12-17: qty 30, 15d supply, fill #0

## 2020-12-17 NOTE — Addendum Note (Signed)
Addended by: Ardeen Garland on: 12/17/2020 12:59 PM   Modules accepted: Orders

## 2020-12-17 NOTE — Telephone Encounter (Signed)
Rx for EMLA cream doesn't seem it is being electronically acknowledged by pts pharmacy, [facility]), therefore I have called this rx in and left it on their VM.  I have also spoken with pts friend, [name] to advise of this. She expressed understanding of this information and the instructions for usage.

## 2020-12-17 NOTE — Telephone Encounter (Signed)
Scheduled appts per 8/1 sch msg. Pts friend, Vickii Chafe is aware.

## 2020-12-17 NOTE — Progress Notes (Signed)
I called the patient to let him know that he need 2 granix injections. I sent a scheduling message to arrange for his first daily injection tomorrow.

## 2020-12-17 NOTE — Telephone Encounter (Signed)
Port dressing- can he take off dressing that was put on today ? I told him yes and instructed him on applying EMLA cream prior to port labs.

## 2020-12-17 NOTE — Telephone Encounter (Signed)
Pt received his POC and requesting rx for EMLA cream. I have sent the rx to pt confirmed pharmacy.

## 2020-12-18 ENCOUNTER — Other Ambulatory Visit: Payer: Self-pay

## 2020-12-18 ENCOUNTER — Inpatient Hospital Stay: Payer: Medicaid Other

## 2020-12-18 VITALS — BP 127/75 | HR 115 | Temp 97.8°F | Resp 18

## 2020-12-18 DIAGNOSIS — Z5112 Encounter for antineoplastic immunotherapy: Secondary | ICD-10-CM | POA: Diagnosis not present

## 2020-12-18 DIAGNOSIS — D709 Neutropenia, unspecified: Secondary | ICD-10-CM

## 2020-12-18 MED ORDER — FILGRASTIM-AAFI 300 MCG/0.5ML IJ SOSY
PREFILLED_SYRINGE | INTRAMUSCULAR | Status: AC
  Filled 2020-12-18: qty 0.5

## 2020-12-18 MED ORDER — TBO-FILGRASTIM 300 MCG/0.5ML ~~LOC~~ SOSY: 300.0000 ug | PREFILLED_SYRINGE | Freq: Once | SUBCUTANEOUS | Status: DC

## 2020-12-18 MED ORDER — FILGRASTIM-AAFI 300 MCG/0.5ML IJ SOSY
300.0000 ug | PREFILLED_SYRINGE | Freq: Once | INTRAMUSCULAR | Status: AC
  Administered 2020-12-18: 300 ug via SUBCUTANEOUS

## 2020-12-18 NOTE — Progress Notes (Signed)
The following biosimilar Nivestym ([id number]-aafi) has been selected for use in this patient.   Kennith Center, Pharm.D., CPP 12/18/2020@9 :52 AM

## 2020-12-18 NOTE — Patient Instructions (Addendum)
Filgrastim, G-CSF injection What is this medication? FILGRASTIM, G-CSF (fil GRA stim) is a granulocyte colony-stimulating factor that stimulates the growth of neutrophils, a type of white blood cell (WBC) important in the body's fight against infection. It is used to reduce the incidence of fever and infection in patients with certain types of cancer who are receiving chemotherapy that affects the bone marrow, to stimulate blood cell production for removal of WBCs from the body prior to a bone marrow transplantation, to reduce the incidence of fever and infection in patients who have severe chronic neutropenia, and to improve survival outcomes following high-dose radiation exposure that is toxic to the bone marrow. This medicine may be used for other purposes; ask your health care provider or pharmacist if you have questions. COMMON BRAND NAME(S): Neupogen, Nivestym, Releuko, Zarxio What should I tell my care team before I take this medication? They need to know if you have any of these conditions: kidney disease latex allergy ongoing radiation therapy sickle cell disease an unusual or allergic reaction to filgrastim, pegfilgrastim, other medicines, foods, dyes, or preservatives pregnant or trying to get pregnant breast-feeding How should I use this medication? This medicine is for injection under the skin or infusion into a vein. As an infusion into a vein, it is usually given by a health care professional in a hospital or clinic setting. If you get this medicine at home, you will be taught how to prepare and give this medicine. Refer to the Instructions for Use that come with your medication packaging. Use exactly as directed. Take your medicine at regular intervals. Do not take your medicine more often than directed. It is important that you put your used needles and syringes in a special sharps container. Do not put them in a trash can. If you do not have a sharps container, call your pharmacist  or healthcare provider to get one. Talk to your pediatrician regarding the use of this medicine in children. While this drug may be prescribed for children as young as 7 months for selected conditions, precautions do apply. Overdosage: If you think you have taken too much of this medicine contact a poison control center or emergency room at once. NOTE: This medicine is only for you. Do not share this medicine with others. What if I miss a dose? It is important not to miss your dose. Call your doctor or health care professional if you miss a dose. What may interact with this medication? This medicine may interact with the following medications: medicines that may cause a release of neutrophils, such as lithium This list may not describe all possible interactions. Give your health care provider a list of all the medicines, herbs, non-prescription drugs, or dietary supplements you use. Also tell them if you smoke, drink alcohol, or use illegal drugs. Some items may interact with your medicine. What should I watch for while using this medication? Your condition will be monitored carefully while you are receiving this medicine. You may need blood work done while you are taking this medicine. Talk to your health care provider about your risk of cancer. You may be more at risk for certain types of cancer if you take this medicine. What side effects may I notice from receiving this medication? Side effects that you should report to your doctor or health care professional as soon as possible: allergic reactions like skin rash, itching or hives, swelling of the face, lips, or tongue back pain dizziness or feeling faint fever pain, redness, or   irritation at site where injected pinpoint red spots on the skin shortness of breath or breathing problems signs and symptoms of kidney injury like trouble passing urine, change in the amount of urine, or red or dark-brown urine stomach or side pain, or pain at  the shoulder swelling tiredness unusual bleeding or bruising Side effects that usually do not require medical attention (report to your doctor or health care professional if they continue or are bothersome): bone pain cough diarrhea hair loss headache muscle pain This list may not describe all possible side effects. Call your doctor for medical advice about side effects. You may report side effects to FDA at 1-800-FDA-1088. Where should I keep my medication? Keep out of the reach of children. Store in a refrigerator between 2 and 8 degrees C (36 and 46 degrees F). Do not freeze. Keep in carton to protect from light. Throw away this medicine if vials or syringes are left out of the refrigerator for more than 24 hours. Throw away any unused medicine after the expiration date. NOTE: This sheet is a summary. It may not cover all possible information. If you have questions about this medicine, talk to your doctor, pharmacist, or health care provider.  2022 Elsevier/Gold Standard (2019-05-26 18:47:55)  

## 2020-12-19 ENCOUNTER — Inpatient Hospital Stay: Payer: Medicaid Other

## 2020-12-19 ENCOUNTER — Other Ambulatory Visit: Payer: Self-pay

## 2020-12-19 VITALS — BP 123/76 | HR 112 | Temp 99.0°F | Resp 18

## 2020-12-19 DIAGNOSIS — D709 Neutropenia, unspecified: Secondary | ICD-10-CM

## 2020-12-19 DIAGNOSIS — Z5112 Encounter for antineoplastic immunotherapy: Secondary | ICD-10-CM | POA: Diagnosis not present

## 2020-12-19 MED ORDER — FILGRASTIM-AAFI 300 MCG/0.5ML IJ SOSY
PREFILLED_SYRINGE | INTRAMUSCULAR | Status: AC
  Filled 2020-12-19: qty 0.5

## 2020-12-19 MED ORDER — FILGRASTIM-AAFI 300 MCG/0.5ML IJ SOSY
300.0000 ug | PREFILLED_SYRINGE | Freq: Once | INTRAMUSCULAR | Status: AC
  Administered 2020-12-19: 300 ug via SUBCUTANEOUS

## 2020-12-19 NOTE — Patient Instructions (Signed)
Filgrastim, G-CSF injection What is this medication? FILGRASTIM, G-CSF (fil GRA stim) is a granulocyte colony-stimulating factor that stimulates the growth of neutrophils, a type of white blood cell (WBC) important in the body's fight against infection. It is used to reduce the incidence of fever and infection in patients with certain types of cancer who are receiving chemotherapy that affects the bone marrow, to stimulate blood cell production for removal of WBCs from the body prior to a bone marrow transplantation, to reduce the incidence of fever and infection in patients who have severe chronic neutropenia, and to improve survival outcomes following high-dose radiation exposure that is toxic to the bone marrow. This medicine may be used for other purposes; ask your health care provider or pharmacist if you have questions. COMMON BRAND NAME(S): Neupogen, Nivestym, Releuko, Zarxio What should I tell my care team before I take this medication? They need to know if you have any of these conditions: kidney disease latex allergy ongoing radiation therapy sickle cell disease an unusual or allergic reaction to filgrastim, pegfilgrastim, other medicines, foods, dyes, or preservatives pregnant or trying to get pregnant breast-feeding How should I use this medication? This medicine is for injection under the skin or infusion into a vein. As an infusion into a vein, it is usually given by a health care professional in a hospital or clinic setting. If you get this medicine at home, you will be taught how to prepare and give this medicine. Refer to the Instructions for Use that come with your medication packaging. Use exactly as directed. Take your medicine at regular intervals. Do not take your medicine more often than directed. It is important that you put your used needles and syringes in a special sharps container. Do not put them in a trash can. If you do not have a sharps container, call your pharmacist  or healthcare provider to get one. Talk to your pediatrician regarding the use of this medicine in children. While this drug may be prescribed for children as young as 7 months for selected conditions, precautions do apply. Overdosage: If you think you have taken too much of this medicine contact a poison control center or emergency room at once. NOTE: This medicine is only for you. Do not share this medicine with others. What if I miss a dose? It is important not to miss your dose. Call your doctor or health care professional if you miss a dose. What may interact with this medication? This medicine may interact with the following medications: medicines that may cause a release of neutrophils, such as lithium This list may not describe all possible interactions. Give your health care provider a list of all the medicines, herbs, non-prescription drugs, or dietary supplements you use. Also tell them if you smoke, drink alcohol, or use illegal drugs. Some items may interact with your medicine. What should I watch for while using this medication? Your condition will be monitored carefully while you are receiving this medicine. You may need blood work done while you are taking this medicine. Talk to your health care provider about your risk of cancer. You may be more at risk for certain types of cancer if you take this medicine. What side effects may I notice from receiving this medication? Side effects that you should report to your doctor or health care professional as soon as possible: allergic reactions like skin rash, itching or hives, swelling of the face, lips, or tongue back pain dizziness or feeling faint fever pain, redness, or   irritation at site where injected pinpoint red spots on the skin shortness of breath or breathing problems signs and symptoms of kidney injury like trouble passing urine, change in the amount of urine, or red or dark-brown urine stomach or side pain, or pain at  the shoulder swelling tiredness unusual bleeding or bruising Side effects that usually do not require medical attention (report to your doctor or health care professional if they continue or are bothersome): bone pain cough diarrhea hair loss headache muscle pain This list may not describe all possible side effects. Call your doctor for medical advice about side effects. You may report side effects to FDA at 1-800-FDA-1088. Where should I keep my medication? Keep out of the reach of children. Store in a refrigerator between 2 and 8 degrees C (36 and 46 degrees F). Do not freeze. Keep in carton to protect from light. Throw away this medicine if vials or syringes are left out of the refrigerator for more than 24 hours. Throw away any unused medicine after the expiration date. NOTE: This sheet is a summary. It may not cover all possible information. If you have questions about this medicine, talk to your doctor, pharmacist, or health care provider.  2022 Elsevier/Gold Standard (2019-05-26 18:47:55)  

## 2020-12-21 ENCOUNTER — Other Ambulatory Visit: Payer: Self-pay

## 2020-12-24 ENCOUNTER — Other Ambulatory Visit: Payer: Medicaid Other

## 2020-12-24 ENCOUNTER — Inpatient Hospital Stay: Payer: Medicaid Other

## 2020-12-24 ENCOUNTER — Other Ambulatory Visit: Payer: Self-pay

## 2020-12-24 DIAGNOSIS — Z5112 Encounter for antineoplastic immunotherapy: Secondary | ICD-10-CM | POA: Diagnosis not present

## 2020-12-24 DIAGNOSIS — C3411 Malignant neoplasm of upper lobe, right bronchus or lung: Secondary | ICD-10-CM

## 2020-12-24 DIAGNOSIS — Z95828 Presence of other vascular implants and grafts: Secondary | ICD-10-CM

## 2020-12-24 LAB — CBC WITH DIFFERENTIAL (CANCER CENTER ONLY)
Abs Immature Granulocytes: 0.39 10*3/uL — ABNORMAL HIGH (ref 0.00–0.07)
Basophils Absolute: 0 10*3/uL (ref 0.0–0.1)
Basophils Relative: 0 %
Eosinophils Absolute: 0 10*3/uL (ref 0.0–0.5)
Eosinophils Relative: 0 %
HCT: 28.2 % — ABNORMAL LOW (ref 39.0–52.0)
Hemoglobin: 9.1 g/dL — ABNORMAL LOW (ref 13.0–17.0)
Immature Granulocytes: 8 %
Lymphocytes Relative: 16 %
Lymphs Abs: 0.9 10*3/uL (ref 0.7–4.0)
MCH: 27.2 pg (ref 26.0–34.0)
MCHC: 32.3 g/dL (ref 30.0–36.0)
MCV: 84.2 fL (ref 80.0–100.0)
Monocytes Absolute: 0.9 10*3/uL (ref 0.1–1.0)
Monocytes Relative: 17 %
Neutro Abs: 3 10*3/uL (ref 1.7–7.7)
Neutrophils Relative %: 59 %
Platelet Count: 98 10*3/uL — ABNORMAL LOW (ref 150–400)
RBC: 3.35 MIL/uL — ABNORMAL LOW (ref 4.22–5.81)
RDW: 19.8 % — ABNORMAL HIGH (ref 11.5–15.5)
WBC Count: 5.2 10*3/uL (ref 4.0–10.5)
nRBC: 0 % (ref 0.0–0.2)

## 2020-12-24 LAB — CMP (CANCER CENTER ONLY)
ALT: 14 U/L (ref 0–44)
AST: 21 U/L (ref 15–41)
Albumin: 3.5 g/dL (ref 3.5–5.0)
Alkaline Phosphatase: 77 U/L (ref 38–126)
Anion gap: 10 (ref 5–15)
BUN: 6 mg/dL — ABNORMAL LOW (ref 8–23)
CO2: 22 mmol/L (ref 22–32)
Calcium: 9.2 mg/dL (ref 8.9–10.3)
Chloride: 106 mmol/L (ref 98–111)
Creatinine: 0.7 mg/dL (ref 0.61–1.24)
GFR, Estimated: >60 mL/min (ref >60)
Glucose, Bld: 89 mg/dL (ref 70–99)
Potassium: 3.7 mmol/L (ref 3.5–5.1)
Sodium: 138 mmol/L (ref 135–145)
Total Bilirubin: 0.2 mg/dL — ABNORMAL LOW (ref 0.3–1.2)
Total Protein: 6.6 g/dL (ref 6.5–8.1)

## 2020-12-24 MED ORDER — HEPARIN SOD (PORK) LOCK FLUSH 100 UNIT/ML IV SOLN
500.0000 [IU] | INTRAVENOUS | Status: AC | PRN
Start: 2020-12-24 — End: 2020-12-24
  Administered 2020-12-24: 500 [IU]
  Filled 2020-12-24: qty 5

## 2020-12-24 MED ORDER — SODIUM CHLORIDE 0.9% FLUSH
10.0000 mL | INTRAVENOUS | Status: AC | PRN
  Administered 2020-12-24: 10 mL
  Filled 2020-12-24: qty 10

## 2020-12-31 ENCOUNTER — Inpatient Hospital Stay: Payer: Medicaid Other

## 2020-12-31 ENCOUNTER — Other Ambulatory Visit (INDEPENDENT_AMBULATORY_CARE_PROVIDER_SITE_OTHER): Payer: Self-pay | Admitting: Primary Care

## 2020-12-31 ENCOUNTER — Inpatient Hospital Stay (HOSPITAL_BASED_OUTPATIENT_CLINIC_OR_DEPARTMENT_OTHER): Payer: Medicaid Other | Admitting: Internal Medicine

## 2020-12-31 ENCOUNTER — Other Ambulatory Visit: Payer: Medicaid Other

## 2020-12-31 ENCOUNTER — Other Ambulatory Visit: Payer: Self-pay

## 2020-12-31 ENCOUNTER — Encounter: Payer: Self-pay | Admitting: Internal Medicine

## 2020-12-31 VITALS — BP 121/75 | HR 113 | Temp 97.6°F | Resp 19 | Ht 61.0 in | Wt 131.9 lb

## 2020-12-31 VITALS — BP 137/77 | HR 111

## 2020-12-31 DIAGNOSIS — C7931 Secondary malignant neoplasm of brain: Secondary | ICD-10-CM | POA: Diagnosis not present

## 2020-12-31 DIAGNOSIS — Z5111 Encounter for antineoplastic chemotherapy: Secondary | ICD-10-CM

## 2020-12-31 DIAGNOSIS — C3411 Malignant neoplasm of upper lobe, right bronchus or lung: Secondary | ICD-10-CM

## 2020-12-31 DIAGNOSIS — Z5112 Encounter for antineoplastic immunotherapy: Secondary | ICD-10-CM | POA: Diagnosis not present

## 2020-12-31 DIAGNOSIS — Z95828 Presence of other vascular implants and grafts: Secondary | ICD-10-CM

## 2020-12-31 LAB — CBC WITH DIFFERENTIAL (CANCER CENTER ONLY)
Abs Immature Granulocytes: 0.03 10*3/uL (ref 0.00–0.07)
Basophils Absolute: 0 10*3/uL (ref 0.0–0.1)
Basophils Relative: 1 %
Eosinophils Absolute: 0 10*3/uL (ref 0.0–0.5)
Eosinophils Relative: 0 %
HCT: 29 % — ABNORMAL LOW (ref 39.0–52.0)
Hemoglobin: 9.2 g/dL — ABNORMAL LOW (ref 13.0–17.0)
Immature Granulocytes: 1 %
Lymphocytes Relative: 19 %
Lymphs Abs: 0.8 10*3/uL (ref 0.7–4.0)
MCH: 27.3 pg (ref 26.0–34.0)
MCHC: 31.7 g/dL (ref 30.0–36.0)
MCV: 86.1 fL (ref 80.0–100.0)
Monocytes Absolute: 0.6 10*3/uL (ref 0.1–1.0)
Monocytes Relative: 13 %
Neutro Abs: 2.9 10*3/uL (ref 1.7–7.7)
Neutrophils Relative %: 66 %
Platelet Count: 198 10*3/uL (ref 150–400)
RBC: 3.37 MIL/uL — ABNORMAL LOW (ref 4.22–5.81)
RDW: 20.2 % — ABNORMAL HIGH (ref 11.5–15.5)
WBC Count: 4.3 10*3/uL (ref 4.0–10.5)
nRBC: 0 % (ref 0.0–0.2)

## 2020-12-31 LAB — CMP (CANCER CENTER ONLY)
ALT: 10 U/L (ref 0–44)
AST: 18 U/L (ref 15–41)
Albumin: 3.5 g/dL (ref 3.5–5.0)
Alkaline Phosphatase: 64 U/L (ref 38–126)
Anion gap: 11 (ref 5–15)
BUN: 6 mg/dL — ABNORMAL LOW (ref 8–23)
CO2: 22 mmol/L (ref 22–32)
Calcium: 9.2 mg/dL (ref 8.9–10.3)
Chloride: 105 mmol/L (ref 98–111)
Creatinine: 0.73 mg/dL (ref 0.61–1.24)
GFR, Estimated: >60 mL/min (ref >60)
Glucose, Bld: 92 mg/dL (ref 70–99)
Potassium: 3.7 mmol/L (ref 3.5–5.1)
Sodium: 138 mmol/L (ref 135–145)
Total Bilirubin: 0.4 mg/dL (ref 0.3–1.2)
Total Protein: 6.7 g/dL (ref 6.5–8.1)

## 2020-12-31 LAB — TSH: TSH: 1.048 u[IU]/mL (ref 0.320–4.118)

## 2020-12-31 MED ORDER — SODIUM CHLORIDE 0.9% FLUSH
10.0000 mL | INTRAVENOUS | Status: DC | PRN
  Administered 2020-12-31: 10 mL
  Filled 2020-12-31: qty 10

## 2020-12-31 MED ORDER — SODIUM CHLORIDE 0.9 % IV SOLN
Freq: Once | INTRAVENOUS | Status: AC
  Filled 2020-12-31: qty 250

## 2020-12-31 MED ORDER — SODIUM CHLORIDE 0.9% FLUSH
10.0000 mL | Freq: Once | INTRAVENOUS | Status: DC
  Filled 2020-12-31: qty 10

## 2020-12-31 MED ORDER — SODIUM CHLORIDE 0.9 % IV SOLN
500.0000 mg/m2 | Freq: Once | INTRAVENOUS | Status: AC
  Administered 2020-12-31: 800 mg via INTRAVENOUS
  Filled 2020-12-31: qty 20

## 2020-12-31 MED ORDER — SODIUM CHLORIDE 0.9 % IV SOLN
200.0000 mg | Freq: Once | INTRAVENOUS | Status: AC
  Administered 2020-12-31: 200 mg via INTRAVENOUS
  Filled 2020-12-31: qty 8

## 2020-12-31 MED ORDER — HEPARIN SOD (PORK) LOCK FLUSH 100 UNIT/ML IV SOLN
500.0000 [IU] | Freq: Once | INTRAVENOUS | Status: AC | PRN
  Administered 2020-12-31: 500 [IU]
  Filled 2020-12-31: qty 5

## 2020-12-31 MED ORDER — PROCHLORPERAZINE MALEATE 10 MG PO TABS
10.0000 mg | ORAL_TABLET | Freq: Once | ORAL | Status: AC
  Administered 2020-12-31: 10 mg via ORAL
  Filled 2020-12-31: qty 1

## 2020-12-31 NOTE — Patient Instructions (Signed)
[facility]  Discharge Instructions: Thank you for choosing [facility] to provide your oncology and hematology care.   If you have a lab appointment with the [facility], please go directly to the [facility] and check in at the registration area.   Wear comfortable clothing and clothing appropriate for easy access to any Portacath or PICC line.   We strive to give you quality time with your provider. You may need to reschedule your appointment if you arrive late (15 or more minutes).  Arriving late affects you and other patients whose appointments are after yours.  Also, if you miss three or more appointments without notifying the office, you may be dismissed from the clinic at the provider's discretion.      For prescription refill requests, have your pharmacy contact our office and allow 72 hours for refills to be completed.    Today you received the following chemotherapy and/or immunotherapy agents : Keytruda, Alimta     To help prevent nausea and vomiting after your treatment, we encourage you to take your nausea medication as directed.  BELOW ARE SYMPTOMS THAT SHOULD BE REPORTED IMMEDIATELY: *FEVER GREATER THAN 100.4 F (38 C) OR HIGHER *CHILLS OR SWEATING *NAUSEA AND VOMITING THAT IS NOT CONTROLLED WITH YOUR NAUSEA MEDICATION *UNUSUAL SHORTNESS OF BREATH *UNUSUAL BRUISING OR BLEEDING *URINARY PROBLEMS (pain or burning when urinating, or frequent urination) *BOWEL PROBLEMS (unusual diarrhea, constipation, pain near the anus) TENDERNESS IN MOUTH AND THROAT WITH OR WITHOUT PRESENCE OF ULCERS (sore throat, sores in mouth, or a toothache) UNUSUAL RASH, SWELLING OR PAIN  UNUSUAL VAGINAL DISCHARGE OR ITCHING   Items with * indicate a potential emergency and should be followed up as soon as possible or go to the Emergency Department if any problems should occur.  Please show the CHEMOTHERAPY ALERT CARD or IMMUNOTHERAPY ALERT CARD at  check-in to the Emergency Department and triage nurse.  Should you have questions after your visit or need to cancel or reschedule your appointment, please contact [facility]  Dept: [phone]  and follow the prompts.  Office hours are 8:00 a.m. to 4:30 p.m. Monday - Friday. Please note that voicemails left after 4:00 p.m. may not be returned until the following business day.  We are closed weekends and major holidays. You have access to a nurse at all times for urgent questions. Please call the main number to the clinic Dept: [phone] and follow the prompts.   For any non-urgent questions, you may also contact your provider using MyChart. We now offer e-Visits for anyone 65 and older to request care online for non-urgent symptoms. For details visit [email or web address].   Also download the MyChart app! Go to the app store, search "MyChart", open the app, select [facility], and log in with your MyChart username and password.  Due to Covid, a mask is required upon entering the hospital/clinic. If you do not have a mask, one will be given to you upon arrival. For doctor visits, patients may have 1 support person aged 57 or older with them. For treatment visits, patients cannot have anyone with them due to current Covid guidelines and our immunocompromised population.

## 2020-12-31 NOTE — Telephone Encounter (Signed)
  Notes to clinic:  Medication filled by a different provider  Review for refill    Requested Prescriptions  Pending Prescriptions Disp Refills   pantoprazole (PROTONIX) 40 MG tablet 30 tablet 2    Sig: Take 1 tablet (40 mg total) by mouth daily with supper.     Gastroenterology: Proton Pump Inhibitors Passed - 12/31/2020  9:01 AM      Passed - Valid encounter within last 12 months    Recent Outpatient Visits           2 months ago Hospital discharge follow-up   [facility], [place], NP   9 months ago Moderate protein-calorie malnutrition ([facility])   [facility] [name], NP   1 year ago Acquired curvature of spine   [place], [place], NP   1 year ago Hypertension, unspecified type   [facility] [name], NP   1 year ago Mixed hyperlipidemia   [place], [place], NP       Future Appointments             In 4 months Oletta Lamas, [place] Cage, NP [facility]

## 2020-12-31 NOTE — Progress Notes (Signed)
Per Dr. Julien Nordmann ,it is okay to treat pt today with Alimta and Keytruda and heart rate of 113/min.

## 2020-12-31 NOTE — Telephone Encounter (Signed)
If refill is appropriate please send to patient pharmacy.

## 2020-12-31 NOTE — Progress Notes (Signed)
.[facility] Telephone:([phone]   [phone]  OFFICE PROGRESS NOTE  [name], NP [id number] [address]  DIAGNOSIS: Metastatic non-small cell lung cancer initially diagnosed as stage IIIA (Tx, N2, M0) non-small cell lung cancer, adenocarcinoma diagnosed in August 2021 and presented with large right paratracheal soft tissue mass extending from the right apex to the right suprahilar and precarinal region.  Molecular studies by [facility]:  [id number], 31.9%, Olaparib  [id number], 68.6%. None   BRAFAmplification, High (+++) Plasma Copy Number: 5.0  PRIOR THERAPY:  1) Concurrent chemoradiation with weekly carboplatin for AUC of 2 and paclitaxel 45 mg/M2.  First dose January 17, 2020.  Status post 7 cycles.  Last dose was given on February 28, 2020 2) Consolidation treatment with immunotherapy with Imfinzi 1500 mg IV every 4 weeks.  First dose April 09, 2020.  Status post 5 cycles.  Last dose was given July 30, 2020 discontinued secondary to disease progression with brain metastasis. 3) Stereotactic left frontoparietal craniotomy for resection of tumor on 08/30/2020.   CURRENT THERAPY: First-line systemic chemotherapy with carboplatin for AUC of 5, Alimta 500 Mg/M2 and Keytruda 200 Mg IV every 3 weeks.  First dose Oct 08, 2020.  Status post 4 cycles.  Starting from cycle #5 he will be on maintenance treatment with Alimta and Keytruda every 3 weeks.  INTERVAL HISTORY: [name] [name] 66 y.o. male returns to the clinic today for follow-up visit.  The patient is feeling fine today with no concerning complaints except for mild fatigue.  He denied having any chest pain, shortness of breath, cough or hemoptysis.  He denied having any fever or chills.  He has no nausea, vomiting, diarrhea or constipation.  He has no headache or visual changes.  He has no recent weight loss or night sweats.  He is here today for evaluation before  starting cycle #5 of his treatment.  MEDICAL HISTORY: Past Medical History:  Diagnosis Date   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[facility]) 12/25/2019   Cancer ([place] [facility])    COPD (chronic obstructive pulmonary disease) ([facility]) 11/02/2007   Qualifier: Diagnosis of  By: Melvyn Novas MD, Christena Deem    Hypertension 12/24/2019   Iron deficiency anemia 08/23/2007   Qualifier: Diagnosis of  By: Jim Like      ALLERGIES:  is allergic to other.  MEDICATIONS:  Current Outpatient Medications  Medication Sig Dispense Refill   acetaminophen (TYLENOL) 500 MG tablet Take 500 mg by mouth every 6 (six) hours as needed for mild pain or moderate pain.     budesonide-formoterol (SYMBICORT) 80-4.5 MCG/ACT inhaler Inhale 2 puffs into the lungs 2 (two) times daily. (Patient taking differently: Inhale 2 puffs into the lungs 2 (two) times daily as needed (for flares of wheezing and/or shortness of breath).) 1 Inhaler 11   feeding supplement (ENSURE ENLIVE / ENSURE PLUS) LIQD Take 237 mLs by mouth 2 (two) times daily between meals. 626 mL 12   folic acid (FOLVITE) 1 MG tablet Take 1 tablet (1 mg total) by mouth daily. 30 tablet 4   levETIRAcetam (KEPPRA) 500 MG tablet Take 1 tablet (500 mg total) by mouth 2 (two) times daily. (Patient not taking: Reported on 12/10/2020) 60 tablet 2   lidocaine-prilocaine (EMLA) cream Apply 1 application topically as needed. 30 g 1   Multiple Vitamin (MULTIVITAMIN) tablet Take 1 tablet by mouth daily.     pantoprazole (PROTONIX) 40 MG tablet Take 1 tablet (40 mg total) by  mouth daily with supper. 30 tablet 2   prochlorperazine (COMPAZINE) 10 MG tablet Take 1 tablet (10 mg total) by mouth every 6 (six) hours as needed for nausea or vomiting. 30 tablet 0   senna (SENOKOT) 8.6 MG TABS tablet Take 1 tablet (8.6 mg total) by mouth 2 (two) times daily. 120 tablet 0   tiotropium (SPIRIVA HANDIHALER) 18 MCG inhalation capsule Place 1 capsule into inhaler and inhale daily. 30 capsule 1   VENTOLIN HFA 108 (90 Base) MCG/ACT  inhaler Inhale 2 puffs into the lungs every 4 (four) hours as needed for wheezing or shortness of breath.     No current facility-administered medications for this visit.   Facility-Administered Medications Ordered in Other Visits  Medication Dose Route Frequency Provider Last Rate Last Admin   sodium chloride flush (NS) 0.9 % injection 10 mL  10 mL Intravenous Once Curt Bears, MD       sodium chloride flush (NS) 0.9 % injection 10 mL  10 mL Intravenous Once Curt Bears, MD        SURGICAL HISTORY:  Past Surgical History:  Procedure Laterality Date   APPLICATION OF CRANIAL NAVIGATION N/A 08/30/2020   Procedure: APPLICATION OF CRANIAL NAVIGATION;  Surgeon: Consuella Lose, MD;  Location: [facility];  Service: Neurosurgery;  Laterality: N/A;   CRANIOTOMY Left 08/30/2020   Procedure: Stereotactic left frontoparietal craniectomy for resection of tumor with brainlab;  Surgeon: Consuella Lose, MD;  Location: [facility];  Service: Neurosurgery;  Laterality: Left;   ENDOBRONCHIAL ULTRASOUND N/A 12/27/2019   Procedure: ENDOBRONCHIAL ULTRASOUND;  Surgeon: Laurin Coder, MD;  Location: [facility];  Service: Endoscopy;  Laterality: N/A;   FINE NEEDLE ASPIRATION  12/27/2019   Procedure: FINE NEEDLE ASPIRATION;  Surgeon: Laurin Coder, MD;  Location: [facility];  Service: Endoscopy;;   IR IMAGING GUIDED PORT INSERTION  11/30/2020   VIDEO BRONCHOSCOPY N/A 12/27/2019   Procedure: VIDEO BRONCHOSCOPY WITHOUT FLUORO;  Surgeon: Laurin Coder, MD;  Location: [facility];  Service: Endoscopy;  Laterality: N/A;    REVIEW OF SYSTEMS:  A comprehensive review of systems was negative except for: Constitutional: positive for fatigue   PHYSICAL EXAMINATION: General appearance: alert, cooperative, fatigued, and no distress Head: Normocephalic, without obvious abnormality, atraumatic Neck: no adenopathy, no JVD, supple, symmetrical, trachea midline, and thyroid not enlarged, symmetric, no  tenderness/mass/nodules Lymph nodes: Cervical, supraclavicular, and axillary nodes normal. Resp: clear to auscultation bilaterally Back: symmetric, no curvature. ROM normal. No CVA tenderness. Cardio: regular rate and rhythm, S1, S2 normal, no murmur, click, rub or gallop GI: soft, non-tender; bowel sounds normal; no masses,  no organomegaly Extremities: extremities normal, atraumatic, no cyanosis or edema  ECOG PERFORMANCE STATUS: 1 - Symptomatic but completely ambulatory  Blood pressure 121/75, pulse (!) 113, temperature 97.6 F (36.4 C), temperature source Tympanic, resp. rate 19, height 5\' 1"  (1.549 m), weight 131 lb 14.4 oz (59.8 kg), SpO2 97 %.  LABORATORY DATA: Lab Results  Component Value Date   WBC 5.2 12/24/2020   HGB 9.1 (L) 12/24/2020   HCT 28.2 (L) 12/24/2020   MCV 84.2 12/24/2020   PLT 98 (L) 12/24/2020      Chemistry      Component Value Date/Time   NA 138 12/24/2020 0818   NA 144 08/03/2019 1527   K 3.7 12/24/2020 0818   CL 106 12/24/2020 0818   CO2 22 12/24/2020 0818   BUN 6 (L) 12/24/2020 0818   BUN 9 08/03/2019 1527   CREATININE 0.70 12/24/2020  0818      Component Value Date/Time   CALCIUM 9.2 12/24/2020 0818   ALKPHOS 77 12/24/2020 0818   AST 21 12/24/2020 0818   ALT 14 12/24/2020 0818   BILITOT 0.2 (L) 12/24/2020 0818       RADIOGRAPHIC STUDIES: CT Chest W Contrast  Result Date: 12/09/2020 CLINICAL DATA:  Non-small cell lung cancer restaging. Previous metastatic disease to the brain. Ongoing chemotherapy. EXAM: CT CHEST, ABDOMEN, AND PELVIS WITH CONTRAST TECHNIQUE: Multidetector CT imaging of the chest, abdomen and pelvis was performed following the standard protocol during bolus administration of intravenous contrast. CONTRAST:  44mL OMNIPAQUE IOHEXOL 350 MG/ML SOLN COMPARISON:  10/11/2020 FINDINGS: CT CHEST FINDINGS Cardiovascular: Right Port-A-Cath tip: Lower SVC. Small anterior pericardial effusion. Atherosclerotic calcification of the aortic  arch. Mediastinum/Nodes: There continues to be indistinctness of tissue planes along the right side of the trachea and esophagus related to prior tumor extension along the mediastinum and regional therapy. This is not readily measurable separate from the pleural-based tumor at the right lung apex medially, which itself measures about 3.0 by 2.5 cm on image 12 series 2, previously 3.1 by 2.6 cm by my measurements. Lungs/Pleura: Emphysema. Airway thickening is present, suggesting bronchitis or reactive airways disease. Right lower lobe pulmonary nodule measures 1.0 by 0.9 cm on image 79 series 4, formerly 1.0 By 0.8 cm by my measurements. The left lower lobe nodule on image 51 series 4 measures 1.1 by 0.9 cm, formerly 1.0 by 0.9 cm by my measurements. The left lower lobe nodule on image 82 series 4 measures 2.3 by 2.1 cm, previously 2.6 by 2.4 cm. Adjacent left lower lobe nodule measures 1.9 by 1.4 cm on image 90 series 4, previously 2.2 by 1.7 cm. Thick-walled cystic/cavitary process anteriorly in the left upper lobe unchanged. Musculoskeletal: Unremarkable CT ABDOMEN PELVIS FINDINGS Hepatobiliary: Stable small hypodense lesion anteriorly in segment 3 of the liver on image 54 series 2, likely benign. Gallbladder unremarkable. Pancreas: Unremarkable Spleen: Unremarkable Adrenals/Urinary Tract: Unremarkable Stomach/Bowel: Unremarkable Vascular/Lymphatic: Mild aortoiliac atherosclerotic calcification. Reproductive: Mild prostatomegaly with the prostate gland indenting the bladder base. Other: No supplemental non-categorized findings. Musculoskeletal: Stable sclerotic sharply defined lesion in the right iliac bone on image 80 of series 2, probably a bone island, not hypermetabolic on prior PET-CT of 01/11/2020. Chronic pars defects at L5 with 9 mm anterolisthesis of L5 on S1 and disc uncovering resulting in right greater than left foraminal impingement at L5-S1. IMPRESSION: 1. The two dominant left lower lobe nodules are  mildly reduced in size, other nodules are essentially stable. The pleural-based lesion along the right medial apex with indistinct margination along the mediastinum is essentially stable as well. No new worrisome lesions identified. 2. Other imaging findings of potential clinical significance: Small anterior pericardial effusion. Aortic Atherosclerosis (ICD10-I70.0). Emphysema (ICD10-J43.9). Airway thickening is present, suggesting bronchitis or reactive airways disease. Prostatomegaly. Chronic pars defects at L5 with right greater than left foraminal impingement at L5-S1. Electronically Signed   By: Van Clines M.D.   On: 12/09/2020 12:56   CT Abdomen Pelvis W Contrast  Result Date: 12/09/2020 CLINICAL DATA:  Non-small cell lung cancer restaging. Previous metastatic disease to the brain. Ongoing chemotherapy. EXAM: CT CHEST, ABDOMEN, AND PELVIS WITH CONTRAST TECHNIQUE: Multidetector CT imaging of the chest, abdomen and pelvis was performed following the standard protocol during bolus administration of intravenous contrast. CONTRAST:  15mL OMNIPAQUE IOHEXOL 350 MG/ML SOLN COMPARISON:  10/11/2020 FINDINGS: CT CHEST FINDINGS Cardiovascular: Right Port-A-Cath tip: Lower SVC. Small anterior pericardial  effusion. Atherosclerotic calcification of the aortic arch. Mediastinum/Nodes: There continues to be indistinctness of tissue planes along the right side of the trachea and esophagus related to prior tumor extension along the mediastinum and regional therapy. This is not readily measurable separate from the pleural-based tumor at the right lung apex medially, which itself measures about 3.0 by 2.5 cm on image 12 series 2, previously 3.1 by 2.6 cm by my measurements. Lungs/Pleura: Emphysema. Airway thickening is present, suggesting bronchitis or reactive airways disease. Right lower lobe pulmonary nodule measures 1.0 by 0.9 cm on image 79 series 4, formerly 1.0 By 0.8 cm by my measurements. The left lower lobe  nodule on image 51 series 4 measures 1.1 by 0.9 cm, formerly 1.0 by 0.9 cm by my measurements. The left lower lobe nodule on image 82 series 4 measures 2.3 by 2.1 cm, previously 2.6 by 2.4 cm. Adjacent left lower lobe nodule measures 1.9 by 1.4 cm on image 90 series 4, previously 2.2 by 1.7 cm. Thick-walled cystic/cavitary process anteriorly in the left upper lobe unchanged. Musculoskeletal: Unremarkable CT ABDOMEN PELVIS FINDINGS Hepatobiliary: Stable small hypodense lesion anteriorly in segment 3 of the liver on image 54 series 2, likely benign. Gallbladder unremarkable. Pancreas: Unremarkable Spleen: Unremarkable Adrenals/Urinary Tract: Unremarkable Stomach/Bowel: Unremarkable Vascular/Lymphatic: Mild aortoiliac atherosclerotic calcification. Reproductive: Mild prostatomegaly with the prostate gland indenting the bladder base. Other: No supplemental non-categorized findings. Musculoskeletal: Stable sclerotic sharply defined lesion in the right iliac bone on image 80 of series 2, probably a bone island, not hypermetabolic on prior PET-CT of 01/11/2020. Chronic pars defects at L5 with 9 mm anterolisthesis of L5 on S1 and disc uncovering resulting in right greater than left foraminal impingement at L5-S1. IMPRESSION: 1. The two dominant left lower lobe nodules are mildly reduced in size, other nodules are essentially stable. The pleural-based lesion along the right medial apex with indistinct margination along the mediastinum is essentially stable as well. No new worrisome lesions identified. 2. Other imaging findings of potential clinical significance: Small anterior pericardial effusion. Aortic Atherosclerosis (ICD10-I70.0). Emphysema (ICD10-J43.9). Airway thickening is present, suggesting bronchitis or reactive airways disease. Prostatomegaly. Chronic pars defects at L5 with right greater than left foraminal impingement at L5-S1. Electronically Signed   By: Van Clines M.D.   On: 12/09/2020 12:56     ASSESSMENT AND PLAN: This is a very pleasant 65 years old African-American male recently diagnosed with metastatic non-small cell lung cancer that was initially diagnosed as stage IIIA (Tx, N2, M0) non-small cell lung cancer, adenocarcinoma presented with large right paratracheal soft tissue mass extending from the right apex to the right suprahilar and precarinal region diagnosed in August 2021.  The patient developed brain metastasis in April 2022. Molecular studies by [employer] showed no actionable mutations. The patient completed a course of concurrent chemoradiation with weekly carboplatin for AUC of 2 and paclitaxel 45 mg/M2.  He is status post 7 cycles.  Last dose was given February 28, 2020 The patient tolerated the previous treatment well and he had partial response. The patient is currently undergoing consolidation treatment with immunotherapy with Imfinzi 1500 mg IV every 4 weeks status post 5 cycles. 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 The patient is here today to start the first cycle of systemic chemotherapy with carboplatin for AUC of 5, Alimta 500 Mg/M2 and Keytruda 200 Mg IV every 3 weeks.  Status post 4 cycles.  Starting from cycle #5 the patient will be  on maintenance treatment with Alimta and Keytruda every 3 weeks. He tolerated the last cycle of his treatment well. I recommended for him to proceed with cycle #5 today as planned. I will see him back for follow-up visit in 3 weeks for evaluation before starting cycle #6. The patient was advised to call immediately if he has any other concerning symptoms in the interval. The patient voices understanding of current disease status and treatment options and is in agreement with the current care plan.  All questions were answered. The patient knows to call the clinic with any problems, questions or concerns. We can certainly see the patient much sooner if  necessary.  Disclaimer: This note was dictated with voice recognition software. Similar sounding words can inadvertently be transcribed and may not be corrected upon review.

## 2021-01-01 ENCOUNTER — Encounter: Payer: Self-pay | Admitting: Internal Medicine

## 2021-01-01 ENCOUNTER — Encounter: Payer: Self-pay | Admitting: Physician Assistant

## 2021-01-01 MED ORDER — PANTOPRAZOLE SODIUM 40 MG PO TBEC
40.0000 mg | DELAYED_RELEASE_TABLET | Freq: Every day | ORAL | 2 refills | Status: DC
  Filled 2021-01-01 – 2021-01-10 (×2): qty 30, 30d supply, fill #0
  Filled 2021-02-26 – 2021-03-11 (×2): qty 30, 30d supply, fill #1

## 2021-01-02 ENCOUNTER — Other Ambulatory Visit: Payer: Self-pay

## 2021-01-04 IMAGING — CT CT CHEST W/ CM
2 of 3 series · 15 of 36 positions shown, 18 images · IV contrast (OMNIPAQUE)
Comparison: 12/24/2019

CLINICAL DATA: Metastatic non-small cell lung cancer, assess
treatment response

EXAM:
CT CHEST WITH CONTRAST
TECHNIQUE: Multidetector CT imaging of the chest was performed during
intravenous contrast administration.
CONTRAST:  75mL OMNIPAQUE IOHEXOL 300 MG/ML  SOLN

[Series 2: axial st · axial · 0.71mm/px · z∈[+1522,+1790]mm · 12 of 158 slices shown, 15 images]
[im 12/158  mediastinal]
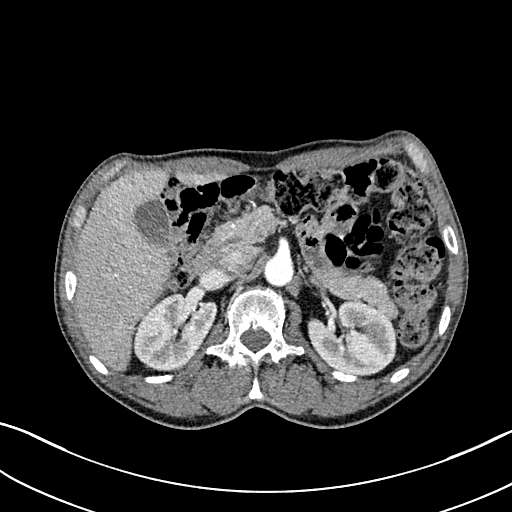
[im 12/158  lung]
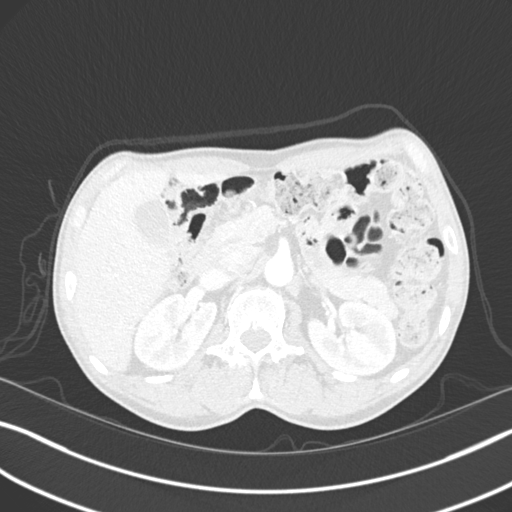
[im 24/158  lung]
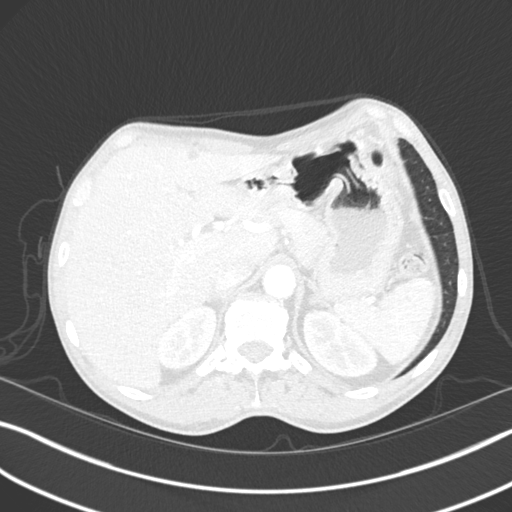
[im 35/158  lung]
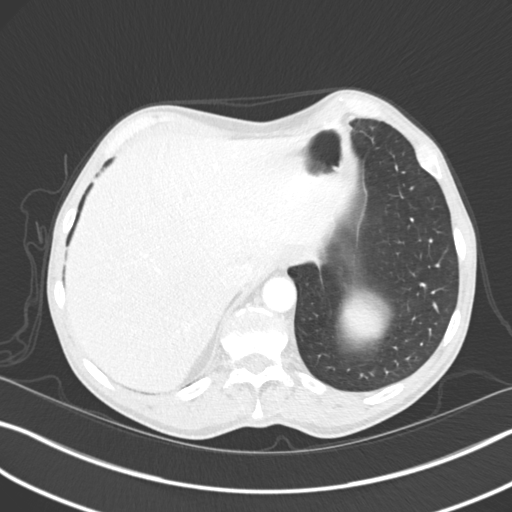
[im 47/158  lung]
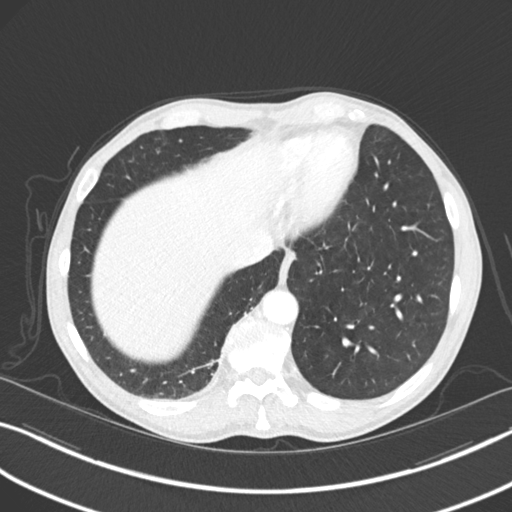
[im 59/158  mediastinal]
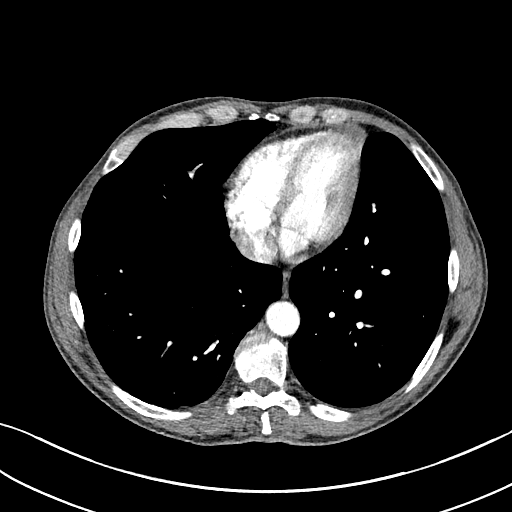
[im 59/158  lung]
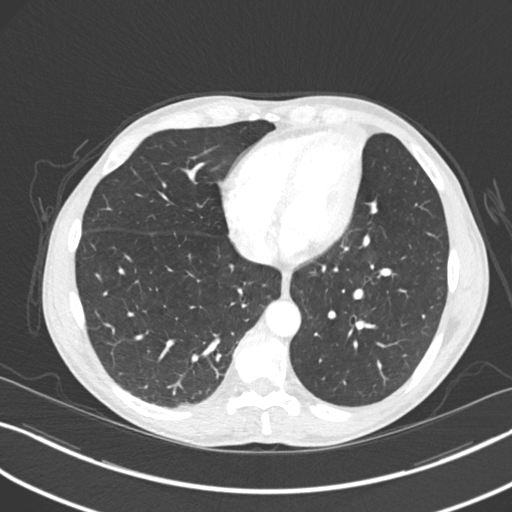
[im 70/158  lung]
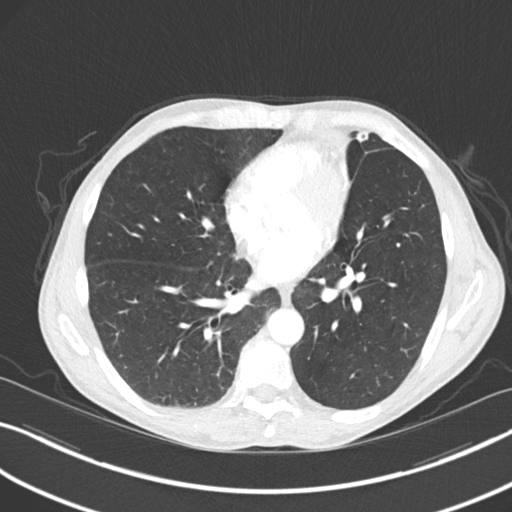
[im 88/158  lung]
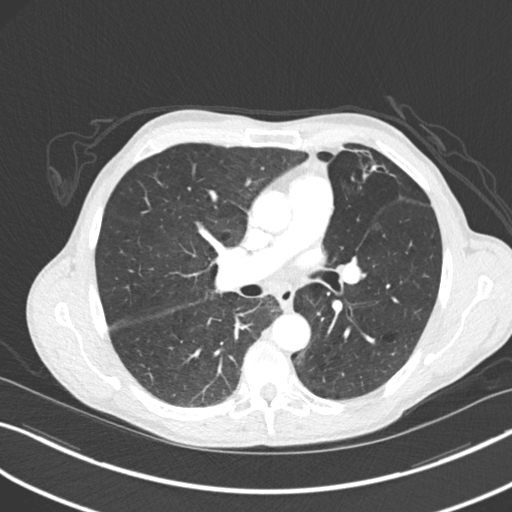
[im 99/158  lung]
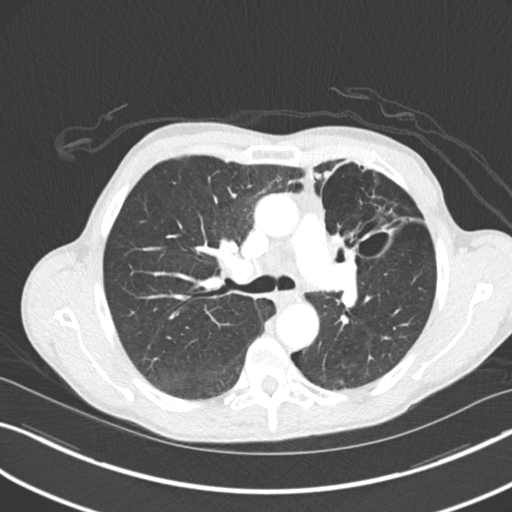
[im 111/158  mediastinal]
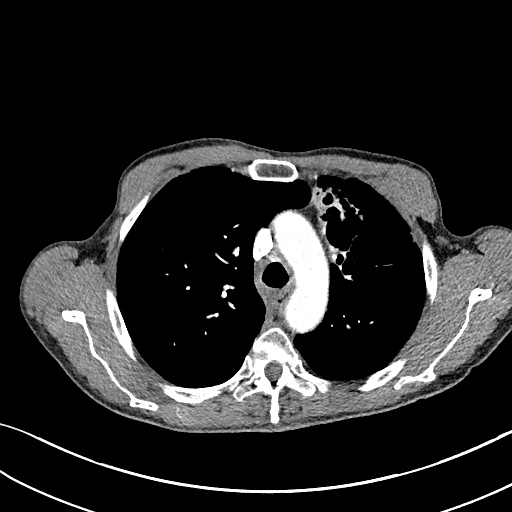
[im 111/158  lung]
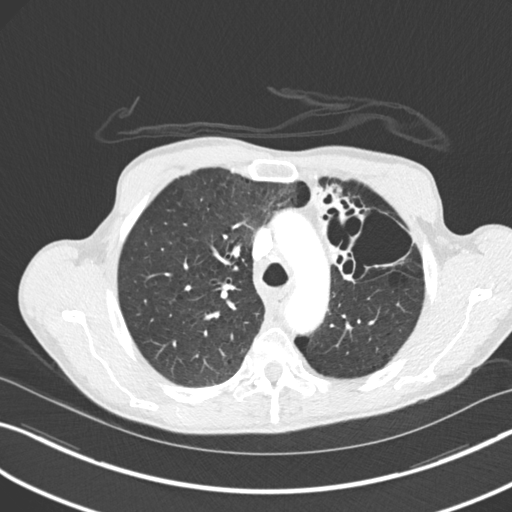
[im 123/158  lung]
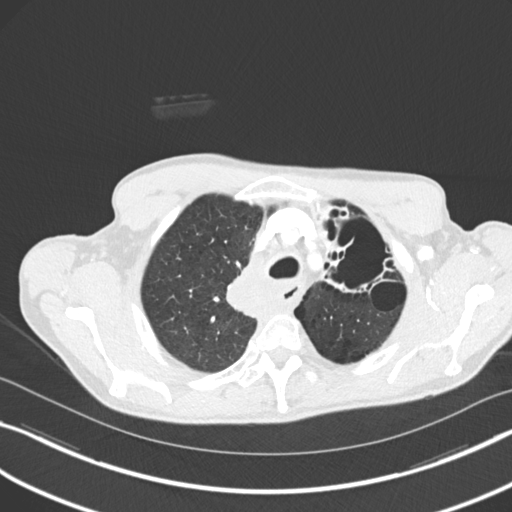
[im 134/158  lung]
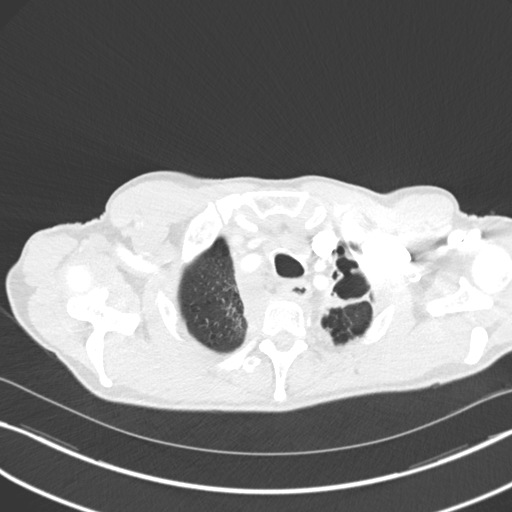
[im 146/158  lung]
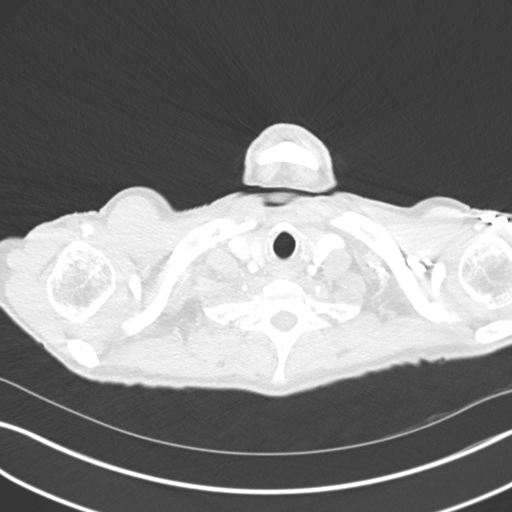

[Series 5: coronal · coronal · 0.64mm/px · 3 of 144 slices shown]
[im 29/144  lung]
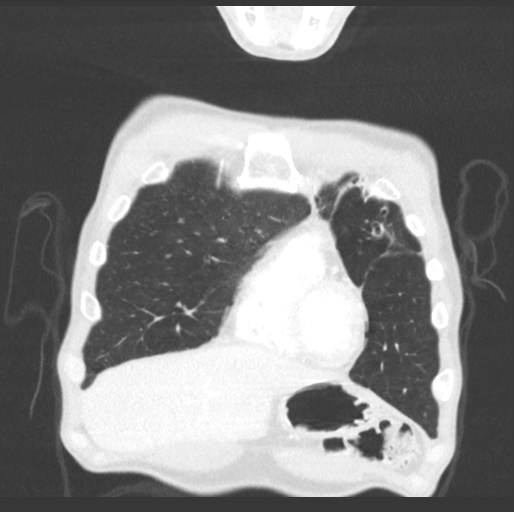
[im 58/144  lung]
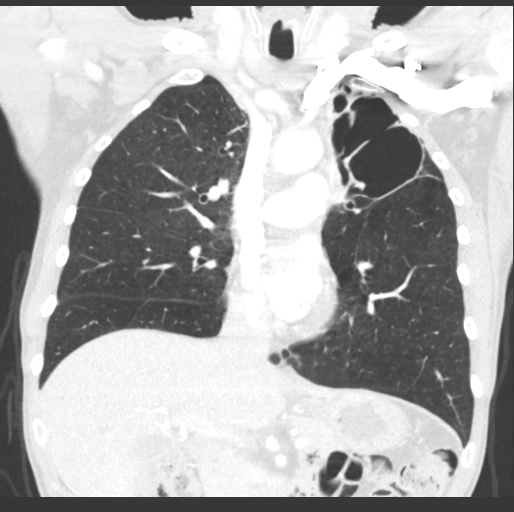
[im 86/144  lung]
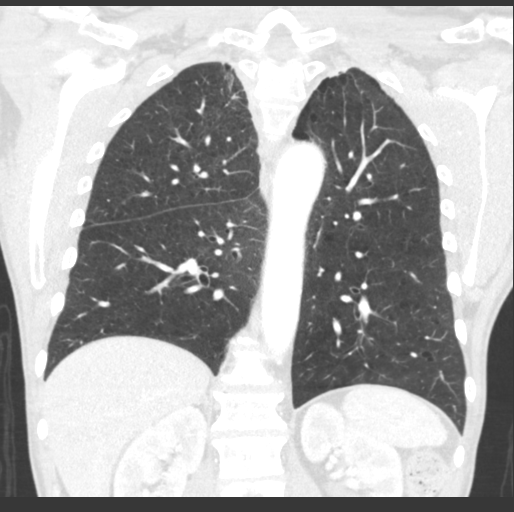

[15 of 36 positions shown; findings below may reference images not displayed]

FINDINGS: Cardiovascular: No significant vascular findings. Normal heart size.
No pericardial effusion.

Mediastinum/Nodes: Significant interval decrease in size of a bulky
soft tissue mass or lymphadenopathy centered about the right
paratracheal station, measuring approximately 4.0 x 3.4 cm,
previously 6.8 x 6.5 cm when measured similarly (series 2, image
32). Interval decrease in size of matted appearing pretracheal lymph
nodes, measuring up to 1.7 x 1.0 cm, previously up to 2.9 x 2.1 cm
(series 2, image 57). Thyroid gland, trachea, and esophagus
demonstrate no significant findings.

Lungs/Pleura: There is a redemonstrated, relatively thick-walled
multi cystic lesion of the anterior left pulmonary apex measuring up
to 6.2 x 5.8 cm, not significantly changed (series 7, image 39).
Mild to moderate centrilobular emphysema. No pleural effusion or
pneumothorax.

Upper Abdomen: No acute abnormality.

Musculoskeletal: No chest wall mass or suspicious bone lesions
identified.
IMPRESSION: 1. Significant interval decrease in size of a bulky soft tissue mass
or lymphadenopathy centered about the right paratracheal station, as
well as matted appearing pretracheal lymph nodes, findings
consistent with treatment response of nodal metastatic disease.
There is no pulmonary primary lesion appreciated, as on prior
examinations.
2. Unchanged, relatively thick-walled multi cystic lesion of the
anterior left pulmonary apex, likely incidental sequelae of prior
infection.
3. Emphysema ([id number]).

## 2021-01-07 ENCOUNTER — Inpatient Hospital Stay: Payer: Medicaid Other

## 2021-01-07 ENCOUNTER — Other Ambulatory Visit: Payer: Medicaid Other

## 2021-01-07 ENCOUNTER — Other Ambulatory Visit: Payer: Self-pay

## 2021-01-09 ENCOUNTER — Encounter: Payer: Self-pay | Admitting: Physician Assistant

## 2021-01-09 ENCOUNTER — Other Ambulatory Visit: Payer: Self-pay

## 2021-01-09 ENCOUNTER — Encounter: Payer: Self-pay | Admitting: Internal Medicine

## 2021-01-10 ENCOUNTER — Other Ambulatory Visit: Payer: Self-pay

## 2021-01-14 ENCOUNTER — Other Ambulatory Visit: Payer: Medicaid Other

## 2021-01-15 ENCOUNTER — Other Ambulatory Visit: Payer: Self-pay

## 2021-01-18 ENCOUNTER — Other Ambulatory Visit: Payer: Self-pay | Admitting: Radiation Therapy

## 2021-01-18 DIAGNOSIS — C7931 Secondary malignant neoplasm of brain: Secondary | ICD-10-CM

## 2021-01-22 ENCOUNTER — Inpatient Hospital Stay: Payer: Medicaid Other | Attending: Internal Medicine | Admitting: Internal Medicine

## 2021-01-22 ENCOUNTER — Other Ambulatory Visit: Payer: Medicaid Other

## 2021-01-22 ENCOUNTER — Inpatient Hospital Stay: Payer: Medicaid Other

## 2021-01-22 ENCOUNTER — Other Ambulatory Visit: Payer: Self-pay

## 2021-01-22 ENCOUNTER — Encounter: Payer: Self-pay | Admitting: Internal Medicine

## 2021-01-22 VITALS — BP 113/69 | HR 108 | Temp 97.6°F | Resp 18 | Wt 128.0 lb

## 2021-01-22 DIAGNOSIS — C3490 Malignant neoplasm of unspecified part of unspecified bronchus or lung: Secondary | ICD-10-CM

## 2021-01-22 DIAGNOSIS — C3411 Malignant neoplasm of upper lobe, right bronchus or lung: Secondary | ICD-10-CM

## 2021-01-22 DIAGNOSIS — C7931 Secondary malignant neoplasm of brain: Secondary | ICD-10-CM

## 2021-01-22 DIAGNOSIS — I10 Essential (primary) hypertension: Secondary | ICD-10-CM | POA: Diagnosis not present

## 2021-01-22 DIAGNOSIS — Z5111 Encounter for antineoplastic chemotherapy: Secondary | ICD-10-CM | POA: Insufficient documentation

## 2021-01-22 DIAGNOSIS — C3491 Malignant neoplasm of unspecified part of right bronchus or lung: Secondary | ICD-10-CM | POA: Insufficient documentation

## 2021-01-22 DIAGNOSIS — Z79899 Other long term (current) drug therapy: Secondary | ICD-10-CM | POA: Insufficient documentation

## 2021-01-22 DIAGNOSIS — Z95828 Presence of other vascular implants and grafts: Secondary | ICD-10-CM

## 2021-01-22 DIAGNOSIS — Z5112 Encounter for antineoplastic immunotherapy: Secondary | ICD-10-CM | POA: Diagnosis present

## 2021-01-22 LAB — CBC WITH DIFFERENTIAL (CANCER CENTER ONLY)
Abs Immature Granulocytes: 0.04 10*3/uL (ref 0.00–0.07)
Basophils Absolute: 0 10*3/uL (ref 0.0–0.1)
Basophils Relative: 1 %
Eosinophils Absolute: 0 10*3/uL (ref 0.0–0.5)
Eosinophils Relative: 0 %
HCT: 31.5 % — ABNORMAL LOW (ref 39.0–52.0)
Hemoglobin: 10.1 g/dL — ABNORMAL LOW (ref 13.0–17.0)
Immature Granulocytes: 1 %
Lymphocytes Relative: 16 %
Lymphs Abs: 0.9 10*3/uL (ref 0.7–4.0)
MCH: 27.9 pg (ref 26.0–34.0)
MCHC: 32.1 g/dL (ref 30.0–36.0)
MCV: 87 fL (ref 80.0–100.0)
Monocytes Absolute: 0.9 10*3/uL (ref 0.1–1.0)
Monocytes Relative: 16 %
Neutro Abs: 3.9 10*3/uL (ref 1.7–7.7)
Neutrophils Relative %: 66 %
Platelet Count: 344 10*3/uL (ref 150–400)
RBC: 3.62 MIL/uL — ABNORMAL LOW (ref 4.22–5.81)
RDW: 17.5 % — ABNORMAL HIGH (ref 11.5–15.5)
WBC Count: 5.8 10*3/uL (ref 4.0–10.5)
nRBC: 0 % (ref 0.0–0.2)

## 2021-01-22 LAB — CMP (CANCER CENTER ONLY)
ALT: 9 U/L (ref 0–44)
AST: 18 U/L (ref 15–41)
Albumin: 3.5 g/dL (ref 3.5–5.0)
Alkaline Phosphatase: 57 U/L (ref 38–126)
Anion gap: 11 (ref 5–15)
BUN: 6 mg/dL — ABNORMAL LOW (ref 8–23)
CO2: 24 mmol/L (ref 22–32)
Calcium: 9.5 mg/dL (ref 8.9–10.3)
Chloride: 106 mmol/L (ref 98–111)
Creatinine: 0.74 mg/dL (ref 0.61–1.24)
GFR, Estimated: >60 mL/min (ref >60)
Glucose, Bld: 82 mg/dL (ref 70–99)
Potassium: 3.8 mmol/L (ref 3.5–5.1)
Sodium: 141 mmol/L (ref 135–145)
Total Bilirubin: 0.3 mg/dL (ref 0.3–1.2)
Total Protein: 6.8 g/dL (ref 6.5–8.1)

## 2021-01-22 MED ORDER — SODIUM CHLORIDE 0.9% FLUSH
10.0000 mL | Freq: Once | INTRAVENOUS | Status: AC
  Administered 2021-01-22: 10 mL via INTRAVENOUS

## 2021-01-22 MED ORDER — SODIUM CHLORIDE 0.9 % IV SOLN
200.0000 mg | Freq: Once | INTRAVENOUS | Status: AC
  Administered 2021-01-22: 200 mg via INTRAVENOUS
  Filled 2021-01-22: qty 8

## 2021-01-22 MED ORDER — CYANOCOBALAMIN 1000 MCG/ML IJ SOLN: 1000.0000 ug | Freq: Once | INTRAMUSCULAR | Status: AC

## 2021-01-22 MED ORDER — SODIUM CHLORIDE 0.9% FLUSH: 10.0000 mL | INTRAVENOUS | Status: DC | PRN

## 2021-01-22 MED ORDER — SODIUM CHLORIDE 0.9 % IV SOLN
500.0000 mg/m2 | Freq: Once | INTRAVENOUS | Status: AC
  Administered 2021-01-22: 800 mg via INTRAVENOUS
  Filled 2021-01-22: qty 20

## 2021-01-22 MED ORDER — HEPARIN SOD (PORK) LOCK FLUSH 100 UNIT/ML IV SOLN: 500.0000 [IU] | Freq: Once | INTRAVENOUS | Status: DC | PRN

## 2021-01-22 MED ORDER — CYANOCOBALAMIN 1000 MCG/ML IJ SOLN
INTRAMUSCULAR | Status: AC
  Administered 2021-01-22: 1000 ug via INTRAMUSCULAR
  Filled 2021-01-22: qty 1

## 2021-01-22 MED ORDER — SODIUM CHLORIDE 0.9 % IV SOLN
Freq: Once | INTRAVENOUS | Status: AC
Start: 2021-01-22 — End: 2021-01-22

## 2021-01-22 MED ORDER — PROCHLORPERAZINE MALEATE 10 MG PO TABS
10.0000 mg | ORAL_TABLET | Freq: Once | ORAL | Status: AC
  Administered 2021-01-22: 10 mg via ORAL
  Filled 2021-01-22: qty 1

## 2021-01-22 NOTE — Patient Instructions (Signed)
[facility]  Discharge Instructions: Thank you for choosing [facility] to provide your oncology and hematology care.   If you have a lab appointment with the [facility], please go directly to the [facility] and check in at the registration area.   Wear comfortable clothing and clothing appropriate for easy access to any Portacath or PICC line.   We strive to give you quality time with your provider. You may need to reschedule your appointment if you arrive late (15 or more minutes).  Arriving late affects you and other patients whose appointments are after yours.  Also, if you miss three or more appointments without notifying the office, you may be dismissed from the clinic at the provider's discretion.      For prescription refill requests, have your pharmacy contact our office and allow 72 hours for refills to be completed.    Today you received the following chemotherapy and/or immunotherapy agents Pembrolizumab and Pemetrexed       To help prevent nausea and vomiting after your treatment, we encourage you to take your nausea medication as directed.  BELOW ARE SYMPTOMS THAT SHOULD BE REPORTED IMMEDIATELY: *FEVER GREATER THAN 100.4 F (38 C) OR HIGHER *CHILLS OR SWEATING *NAUSEA AND VOMITING THAT IS NOT CONTROLLED WITH YOUR NAUSEA MEDICATION *UNUSUAL SHORTNESS OF BREATH *UNUSUAL BRUISING OR BLEEDING *URINARY PROBLEMS (pain or burning when urinating, or frequent urination) *BOWEL PROBLEMS (unusual diarrhea, constipation, pain near the anus) TENDERNESS IN MOUTH AND THROAT WITH OR WITHOUT PRESENCE OF ULCERS (sore throat, sores in mouth, or a toothache) UNUSUAL RASH, SWELLING OR PAIN  UNUSUAL VAGINAL DISCHARGE OR ITCHING   Items with * indicate a potential emergency and should be followed up as soon as possible or go to the Emergency Department if any problems should occur.  Please show the CHEMOTHERAPY ALERT CARD or IMMUNOTHERAPY ALERT  CARD at check-in to the Emergency Department and triage nurse.  Should you have questions after your visit or need to cancel or reschedule your appointment, please contact [facility]  Dept: [phone]  and follow the prompts.  Office hours are 8:00 a.m. to 4:30 p.m. Monday - Friday. Please note that voicemails left after 4:00 p.m. may not be returned until the following business day.  We are closed weekends and major holidays. You have access to a nurse at all times for urgent questions. Please call the main number to the clinic Dept: [phone] and follow the prompts.   For any non-urgent questions, you may also contact your provider using MyChart. We now offer e-Visits for anyone 13 and older to request care online for non-urgent symptoms. For details visit [email or web address].   Also download the MyChart app! Go to the app store, search "MyChart", open the app, select [facility], and log in with your MyChart username and password.  Due to Covid, a mask is required upon entering the hospital/clinic. If you do not have a mask, one will be given to you upon arrival. For doctor visits, patients may have 1 support person aged 29 or older with them. For treatment visits, patients cannot have anyone with them due to current Covid guidelines and our immunocompromised population.

## 2021-01-22 NOTE — Progress Notes (Signed)
.[place] [phone]   Fax:(336) [phone]  OFFICE PROGRESS NOTE  [name], NP [address] [address]  DIAGNOSIS: Metastatic non-small cell lung cancer initially diagnosed as stage IIIA (Tx, N2, M0) non-small cell lung cancer, adenocarcinoma diagnosed in August 2021 and presented with large right paratracheal soft tissue mass extending from the right apex to the right suprahilar and precarinal region.  Molecular studies by [employer]:  [id number], 31.9%, Olaparib  [id number], 68.6%. None   BRAFAmplification, High (+++) Plasma Copy Number: 5.0  PRIOR THERAPY:  1) Concurrent chemoradiation with weekly carboplatin for AUC of 2 and paclitaxel 45 mg/M2.  First dose January 17, 2020.  Status post 7 cycles.  Last dose was given on February 28, 2020 2) Consolidation treatment with immunotherapy with Imfinzi 1500 mg IV every 4 weeks.  First dose April 09, 2020.  Status post 5 cycles.  Last dose was given July 30, 2020 discontinued secondary to disease progression with brain metastasis. 3) Stereotactic left frontoparietal craniotomy for resection of tumor on 08/30/2020.   CURRENT THERAPY: First-line systemic chemotherapy with carboplatin for AUC of 5, Alimta 500 Mg/M2 and Keytruda 200 Mg IV every 3 weeks.  First dose Oct 08, 2020.  Status post 5 cycles.  Starting from cycle #5 he will be on maintenance treatment with Alimta and Keytruda every 3 weeks.  INTERVAL HISTORY: [name] [name] 65 y.o. male returns to the clinic today for follow-up visit.  The patient is feeling fine today with no concerning complaints.  He continues to tolerate his treatment with maintenance Alimta and Keytruda fairly well.  He denied having any current chest pain, shortness of breath, cough or hemoptysis.  He denied having any fever or chills.  He has no nausea, vomiting, diarrhea or constipation.  He has no headache or visual changes.  He denied  having any significant weight loss or night sweats.  The patient is here today for evaluation before starting cycle #6.  MEDICAL HISTORY: Past Medical History:  Diagnosis Date   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[place]) 12/25/2019   Cancer ([place])    COPD (chronic obstructive pulmonary disease) ([facility]) 11/02/2007   Qualifier: Diagnosis of  By: Melvyn Novas MD, Christena Deem    Hypertension 12/24/2019   Iron deficiency anemia 08/23/2007   Qualifier: Diagnosis of  By: Jim Like      ALLERGIES:  is allergic to other.  MEDICATIONS:  Current Outpatient Medications  Medication Sig Dispense Refill   acetaminophen (TYLENOL) 500 MG tablet Take 500 mg by mouth every 6 (six) hours as needed for mild pain or moderate pain.     budesonide-formoterol (SYMBICORT) 80-4.5 MCG/ACT inhaler Inhale 2 puffs into the lungs 2 (two) times daily. (Patient taking differently: Inhale 2 puffs into the lungs 2 (two) times daily as needed (for flares of wheezing and/or shortness of breath).) 1 Inhaler 11   feeding supplement (ENSURE ENLIVE / ENSURE PLUS) LIQD Take 237 mLs by mouth 2 (two) times daily between meals. 203 mL 12   folic acid (FOLVITE) 1 MG tablet Take 1 tablet (1 mg total) by mouth daily. 30 tablet 4   levETIRAcetam (KEPPRA) 500 MG tablet Take 1 tablet (500 mg total) by mouth 2 (two) times daily. 60 tablet 2   lidocaine-prilocaine (EMLA) cream Apply 1 application topically as needed. 30 g 1   Multiple Vitamin (MULTIVITAMIN) tablet Take 1 tablet by mouth daily.     pantoprazole (PROTONIX) 40 MG tablet Take 1 tablet (  40 mg total) by mouth daily with supper. 30 tablet 2   prochlorperazine (COMPAZINE) 10 MG tablet Take 1 tablet (10 mg total) by mouth every 6 (six) hours as needed for nausea or vomiting. 30 tablet 0   senna (SENOKOT) 8.6 MG TABS tablet Take 1 tablet (8.6 mg total) by mouth 2 (two) times daily. 120 tablet 0   tiotropium (SPIRIVA HANDIHALER) 18 MCG inhalation capsule Place 1 capsule into inhaler and inhale daily. 30 capsule  1   VENTOLIN HFA 108 (90 Base) MCG/ACT inhaler Inhale 2 puffs into the lungs every 4 (four) hours as needed for wheezing or shortness of breath.     No current facility-administered medications for this visit.    SURGICAL HISTORY:  Past Surgical History:  Procedure Laterality Date   APPLICATION OF CRANIAL NAVIGATION N/A 08/30/2020   Procedure: APPLICATION OF CRANIAL NAVIGATION;  Surgeon: Consuella Lose, MD;  Location: [facility];  Service: Neurosurgery;  Laterality: N/A;   CRANIOTOMY Left 08/30/2020   Procedure: Stereotactic left frontoparietal craniectomy for resection of tumor with brainlab;  Surgeon: Consuella Lose, MD;  Location: [facility];  Service: Neurosurgery;  Laterality: Left;   ENDOBRONCHIAL ULTRASOUND N/A 12/27/2019   Procedure: ENDOBRONCHIAL ULTRASOUND;  Surgeon: Laurin Coder, MD;  Location: [facility];  Service: Endoscopy;  Laterality: N/A;   FINE NEEDLE ASPIRATION  12/27/2019   Procedure: FINE NEEDLE ASPIRATION;  Surgeon: Laurin Coder, MD;  Location: [facility];  Service: Endoscopy;;   IR IMAGING GUIDED PORT INSERTION  11/30/2020   VIDEO BRONCHOSCOPY N/A 12/27/2019   Procedure: VIDEO BRONCHOSCOPY WITHOUT FLUORO;  Surgeon: Laurin Coder, MD;  Location: [facility];  Service: Endoscopy;  Laterality: N/A;    REVIEW OF SYSTEMS:  A comprehensive review of systems was negative.   PHYSICAL EXAMINATION: General appearance: alert, cooperative, and no distress Head: Normocephalic, without obvious abnormality, atraumatic Neck: no adenopathy, no JVD, supple, symmetrical, trachea midline, and thyroid not enlarged, symmetric, no tenderness/mass/nodules Lymph nodes: Cervical, supraclavicular, and axillary nodes normal. Resp: clear to auscultation bilaterally Back: symmetric, no curvature. ROM normal. No CVA tenderness. Cardio: regular rate and rhythm, S1, S2 normal, no murmur, click, rub or gallop GI: soft, non-tender; bowel sounds normal; no masses,  no  organomegaly Extremities: extremities normal, atraumatic, no cyanosis or edema  ECOG PERFORMANCE STATUS: 1 - Symptomatic but completely ambulatory  Blood pressure 113/69, pulse (!) 108, temperature 97.6 F (36.4 C), temperature source Tympanic, resp. rate 18, weight 128 lb (58.1 kg), SpO2 97 %.  LABORATORY DATA: Lab Results  Component Value Date   WBC 5.8 01/22/2021   HGB 10.1 (L) 01/22/2021   HCT 31.5 (L) 01/22/2021   MCV 87.0 01/22/2021   PLT 344 01/22/2021      Chemistry      Component Value Date/Time   NA 138 12/31/2020 0841   NA 144 08/03/2019 1527   K 3.7 12/31/2020 0841   CL 105 12/31/2020 0841   CO2 22 12/31/2020 0841   BUN 6 (L) 12/31/2020 0841   BUN 9 08/03/2019 1527   CREATININE 0.73 12/31/2020 0841      Component Value Date/Time   CALCIUM 9.2 12/31/2020 0841   ALKPHOS 64 12/31/2020 0841   AST 18 12/31/2020 0841   ALT 10 12/31/2020 0841   BILITOT 0.4 12/31/2020 0841       RADIOGRAPHIC STUDIES: No results found.  ASSESSMENT AND PLAN: This is a very pleasant 65 years old African-American male recently diagnosed with metastatic non-small cell lung cancer that was initially  diagnosed as stage IIIA (Tx, N2, M0) non-small cell lung cancer, adenocarcinoma presented with large right paratracheal soft tissue mass extending from the right apex to the right suprahilar and precarinal region diagnosed in August 2021.  The patient developed brain metastasis in April 2022. Molecular studies by [employer] showed no actionable mutations. The patient completed a course of concurrent chemoradiation with weekly carboplatin for AUC of 2 and paclitaxel 45 mg/M2.  He is status post 7 cycles.  Last dose was given February 28, 2020 The patient tolerated the previous treatment well and he had partial response. The patient is currently undergoing consolidation treatment with immunotherapy with Imfinzi 1500 mg IV every 4 weeks status post 5 cycles. 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 The patient is here today to start the first cycle of systemic chemotherapy with carboplatin for AUC of 5, Alimta 500 Mg/M2 and Keytruda 200 Mg IV every 3 weeks.  Status post 5 cycles.  Starting from cycle #5 the patient will be on maintenance treatment with Alimta and Keytruda every 3 weeks. The patient continues to tolerate this treatment well with no concerning adverse effects. I recommended for him to proceed with cycle #6 today as planned. I will see him back for follow-up visit in 3 weeks for evaluation with repeat CT scan of the chest for restaging of his disease. The patient was advised to call immediately if he has any other concerning symptoms in the interval. The patient voices understanding of current disease status and treatment options and is in agreement with the current care plan.  All questions were answered. The patient knows to call the clinic with any problems, questions or concerns. We can certainly see the patient much sooner if necessary.  Disclaimer: This note was dictated with voice recognition software. Similar sounding words can inadvertently be transcribed and may not be corrected upon review.

## 2021-01-22 NOTE — Progress Notes (Signed)
Per Dr. Julien Nordmann, "OK To Treat w/elevated HR"

## 2021-01-28 ENCOUNTER — Other Ambulatory Visit: Payer: Self-pay | Admitting: Radiation Therapy

## 2021-02-05 ENCOUNTER — Ambulatory Visit (HOSPITAL_COMMUNITY): Payer: Medicaid Other

## 2021-02-08 ENCOUNTER — Other Ambulatory Visit: Payer: Self-pay

## 2021-02-08 ENCOUNTER — Encounter (HOSPITAL_COMMUNITY): Payer: Self-pay

## 2021-02-08 ENCOUNTER — Ambulatory Visit (HOSPITAL_COMMUNITY)
Admission: RE | Admit: 2021-02-08 | Discharge: 2021-02-08 | Disposition: A | Discharge status: Home / Self Care | Payer: Medicaid Other | Source: Ambulatory Visit | Attending: Internal Medicine | Admitting: Internal Medicine

## 2021-02-08 DIAGNOSIS — C3490 Malignant neoplasm of unspecified part of unspecified bronchus or lung: Secondary | ICD-10-CM | POA: Diagnosis present

## 2021-02-08 MED ORDER — IOHEXOL 350 MG/ML SOLN
80.0000 mL | Freq: Once | INTRAVENOUS | Status: AC | PRN
  Administered 2021-02-08: 80 mL via INTRAVENOUS

## 2021-02-08 MED ORDER — HEPARIN SOD (PORK) LOCK FLUSH 100 UNIT/ML IV SOLN
INTRAVENOUS | Status: AC
  Filled 2021-02-08: qty 5

## 2021-02-08 MED ORDER — HEPARIN SOD (PORK) LOCK FLUSH 100 UNIT/ML IV SOLN: 500.0000 [IU] | Freq: Once | INTRAVENOUS | Status: DC

## 2021-02-11 ENCOUNTER — Other Ambulatory Visit: Payer: Self-pay

## 2021-02-11 ENCOUNTER — Inpatient Hospital Stay: Payer: Medicaid Other

## 2021-02-11 ENCOUNTER — Inpatient Hospital Stay (HOSPITAL_BASED_OUTPATIENT_CLINIC_OR_DEPARTMENT_OTHER): Payer: Medicaid Other | Admitting: Internal Medicine

## 2021-02-11 VITALS — BP 116/71 | HR 105 | Temp 97.0°F | Resp 18 | Wt 122.3 lb

## 2021-02-11 VITALS — HR 112

## 2021-02-11 DIAGNOSIS — C3411 Malignant neoplasm of upper lobe, right bronchus or lung: Secondary | ICD-10-CM

## 2021-02-11 DIAGNOSIS — Z95828 Presence of other vascular implants and grafts: Secondary | ICD-10-CM

## 2021-02-11 DIAGNOSIS — Z5112 Encounter for antineoplastic immunotherapy: Secondary | ICD-10-CM

## 2021-02-11 LAB — CBC WITH DIFFERENTIAL (CANCER CENTER ONLY)
Abs Immature Granulocytes: 0.05 10*3/uL (ref 0.00–0.07)
Basophils Absolute: 0 10*3/uL (ref 0.0–0.1)
Basophils Relative: 1 %
Eosinophils Absolute: 0.1 10*3/uL (ref 0.0–0.5)
Eosinophils Relative: 1 %
HCT: 33.6 % — ABNORMAL LOW (ref 39.0–52.0)
Hemoglobin: 10.7 g/dL — ABNORMAL LOW (ref 13.0–17.0)
Immature Granulocytes: 1 %
Lymphocytes Relative: 17 %
Lymphs Abs: 1 10*3/uL (ref 0.7–4.0)
MCH: 27.4 pg (ref 26.0–34.0)
MCHC: 31.8 g/dL (ref 30.0–36.0)
MCV: 86.2 fL (ref 80.0–100.0)
Monocytes Absolute: 0.9 10*3/uL (ref 0.1–1.0)
Monocytes Relative: 15 %
Neutro Abs: 3.8 10*3/uL (ref 1.7–7.7)
Neutrophils Relative %: 65 %
Platelet Count: 332 10*3/uL (ref 150–400)
RBC: 3.9 MIL/uL — ABNORMAL LOW (ref 4.22–5.81)
RDW: 15.8 % — ABNORMAL HIGH (ref 11.5–15.5)
WBC Count: 5.9 10*3/uL (ref 4.0–10.5)
nRBC: 0 % (ref 0.0–0.2)

## 2021-02-11 LAB — CMP (CANCER CENTER ONLY)
ALT: 9 U/L (ref 0–44)
AST: 24 U/L (ref 15–41)
Albumin: 3.5 g/dL (ref 3.5–5.0)
Alkaline Phosphatase: 61 U/L (ref 38–126)
Anion gap: 12 (ref 5–15)
BUN: 9 mg/dL (ref 8–23)
CO2: 21 mmol/L — ABNORMAL LOW (ref 22–32)
Calcium: 9.9 mg/dL (ref 8.9–10.3)
Chloride: 109 mmol/L (ref 98–111)
Creatinine: 0.76 mg/dL (ref 0.61–1.24)
GFR, Estimated: >60 mL/min (ref >60)
Glucose, Bld: 88 mg/dL (ref 70–99)
Potassium: 4 mmol/L (ref 3.5–5.1)
Sodium: 142 mmol/L (ref 135–145)
Total Bilirubin: 0.2 mg/dL — ABNORMAL LOW (ref 0.3–1.2)
Total Protein: 7.2 g/dL (ref 6.5–8.1)

## 2021-02-11 LAB — TSH: TSH: 1.888 u[IU]/mL (ref 0.320–4.118)

## 2021-02-11 MED ORDER — SODIUM CHLORIDE 0.9 % IV SOLN
500.0000 mg/m2 | Freq: Once | INTRAVENOUS | Status: AC
  Administered 2021-02-11: 800 mg via INTRAVENOUS
  Filled 2021-02-11: qty 20

## 2021-02-11 MED ORDER — SODIUM CHLORIDE 0.9 % IV SOLN
200.0000 mg | Freq: Once | INTRAVENOUS | Status: AC
  Administered 2021-02-11: 200 mg via INTRAVENOUS
  Filled 2021-02-11: qty 8

## 2021-02-11 MED ORDER — SODIUM CHLORIDE 0.9% FLUSH
10.0000 mL | INTRAVENOUS | Status: AC | PRN
  Administered 2021-02-11: 10 mL

## 2021-02-11 MED ORDER — PROCHLORPERAZINE MALEATE 10 MG PO TABS
10.0000 mg | ORAL_TABLET | Freq: Once | ORAL | Status: AC
  Administered 2021-02-11: 10 mg via ORAL
  Filled 2021-02-11: qty 1

## 2021-02-11 MED ORDER — CYANOCOBALAMIN 1000 MCG/ML IJ SOLN: 1000.0000 ug | Freq: Once | INTRAMUSCULAR | Status: DC

## 2021-02-11 MED ORDER — SODIUM CHLORIDE 0.9 % IV SOLN: Freq: Once | INTRAVENOUS | Status: AC

## 2021-02-11 MED ORDER — HEPARIN SOD (PORK) LOCK FLUSH 100 UNIT/ML IV SOLN
500.0000 [IU] | Freq: Once | INTRAVENOUS | Status: AC | PRN
Start: 2021-02-11 — End: 2021-02-11
  Administered 2021-02-11: 500 [IU]

## 2021-02-11 MED ORDER — SODIUM CHLORIDE 0.9% FLUSH
10.0000 mL | INTRAVENOUS | Status: DC | PRN
  Administered 2021-02-11: 10 mL

## 2021-02-11 NOTE — Progress Notes (Signed)
.[name]   Fax:(336) [phone]  OFFICE PROGRESS NOTE  [name], NP [address] [address]  DIAGNOSIS: Metastatic non-small cell lung cancer initially diagnosed as stage IIIA (Tx, N2, M0) non-small cell lung cancer, adenocarcinoma diagnosed in August 2021 and presented with large right paratracheal soft tissue mass extending from the right apex to the right suprahilar and precarinal region.  Molecular studies by [employer]:  [id number], 31.9%, Olaparib  [id number], 68.6%. None   BRAFAmplification, High (+++) Plasma Copy Number: 5.0  PRIOR THERAPY:  1) Concurrent chemoradiation with weekly carboplatin for AUC of 2 and paclitaxel 45 mg/M2.  First dose January 17, 2020.  Status post 7 cycles.  Last dose was given on February 28, 2020 2) Consolidation treatment with immunotherapy with Imfinzi 1500 mg IV every 4 weeks.  First dose April 09, 2020.  Status post 5 cycles.  Last dose was given July 30, 2020 discontinued secondary to disease progression with brain metastasis. 3) Stereotactic left frontoparietal craniotomy for resection of tumor on 08/30/2020.   CURRENT THERAPY: First-line systemic chemotherapy with carboplatin for AUC of 5, Alimta 500 Mg/M2 and Keytruda 200 Mg IV every 3 weeks.  First dose Oct 08, 2020.  Status post 6 cycles.  Starting from cycle #5 he will be on maintenance treatment with Alimta and Keytruda every 3 weeks.  INTERVAL HISTORY: [name] [name] 65 y.o. male returns to the clinic today for follow-up visit.  The patient is feeling fine today with no concerning complaints except for occasional headache and he takes Tylenol with some improvement.  He denied having any current chest pain, shortness of breath, cough or hemoptysis.  He denied having any fever or chills.  He has no nausea, vomiting, diarrhea or constipation.  He has no visual changes.  The patient continues to tolerate his  treatment with Alimta and Keytruda fairly well.  He is here today for evaluation before starting cycle #7 with repeat CT scan of the chest, abdomen pelvis for restaging of his disease.  MEDICAL HISTORY: Past Medical History:  Diagnosis Date   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[facility] [place]) 12/25/2019   Cancer ([place] [facility])    COPD (chronic obstructive pulmonary disease) ([place] [facility]) 11/02/2007   Qualifier: Diagnosis of  By: Melvyn Novas MD, Christena Deem    Hypertension 12/24/2019   Iron deficiency anemia 08/23/2007   Qualifier: Diagnosis of  By: Jim Like      ALLERGIES:  is allergic to other.  MEDICATIONS:  Current Outpatient Medications  Medication Sig Dispense Refill   acetaminophen (TYLENOL) 500 MG tablet Take 500 mg by mouth every 6 (six) hours as needed for mild pain or moderate pain.     budesonide-formoterol (SYMBICORT) 80-4.5 MCG/ACT inhaler Inhale 2 puffs into the lungs 2 (two) times daily. (Patient taking differently: Inhale 2 puffs into the lungs 2 (two) times daily as needed (for flares of wheezing and/or shortness of breath).) 1 Inhaler 11   feeding supplement (ENSURE ENLIVE / ENSURE PLUS) LIQD Take 237 mLs by mouth 2 (two) times daily between meals. 073 mL 12   folic acid (FOLVITE) 1 MG tablet Take 1 tablet (1 mg total) by mouth daily. 30 tablet 4   levETIRAcetam (KEPPRA) 500 MG tablet Take 1 tablet (500 mg total) by mouth 2 (two) times daily. 60 tablet 2   lidocaine-prilocaine (EMLA) cream Apply 1 application topically as needed. 30 g 1   Multiple Vitamin (MULTIVITAMIN) tablet Take 1 tablet by mouth daily.  pantoprazole (PROTONIX) 40 MG tablet Take 1 tablet (40 mg total) by mouth daily with supper. 30 tablet 2   prochlorperazine (COMPAZINE) 10 MG tablet Take 1 tablet (10 mg total) by mouth every 6 (six) hours as needed for nausea or vomiting. 30 tablet 0   senna (SENOKOT) 8.6 MG TABS tablet Take 1 tablet (8.6 mg total) by mouth 2 (two) times daily. 120 tablet 0   tiotropium (SPIRIVA HANDIHALER) 18 MCG inhalation  capsule Place 1 capsule into inhaler and inhale daily. 30 capsule 1   VENTOLIN HFA 108 (90 Base) MCG/ACT inhaler Inhale 2 puffs into the lungs every 4 (four) hours as needed for wheezing or shortness of breath.     No current facility-administered medications for this visit.    SURGICAL HISTORY:  Past Surgical History:  Procedure Laterality Date   APPLICATION OF CRANIAL NAVIGATION N/A 08/30/2020   Procedure: APPLICATION OF CRANIAL NAVIGATION;  Surgeon: Consuella Lose, MD;  Location: [facility];  Service: Neurosurgery;  Laterality: N/A;   CRANIOTOMY Left 08/30/2020   Procedure: Stereotactic left frontoparietal craniectomy for resection of tumor with brainlab;  Surgeon: Consuella Lose, MD;  Location: [facility];  Service: Neurosurgery;  Laterality: Left;   ENDOBRONCHIAL ULTRASOUND N/A 12/27/2019   Procedure: ENDOBRONCHIAL ULTRASOUND;  Surgeon: Laurin Coder, MD;  [facility];  Service: Endoscopy;  Laterality: N/A;   FINE NEEDLE ASPIRATION  12/27/2019   Procedure: FINE NEEDLE ASPIRATION;  Surgeon: Laurin Coder, MD;  [facility];  Service: Endoscopy;;   IR IMAGING GUIDED PORT INSERTION  11/30/2020   VIDEO BRONCHOSCOPY N/A 12/27/2019   Procedure: VIDEO BRONCHOSCOPY WITHOUT FLUORO;  Surgeon: Laurin Coder, MD;  [facility];  Service: Endoscopy;  Laterality: N/A;    REVIEW OF SYSTEMS:  Constitutional: positive for fatigue Eyes: negative Ears, nose, mouth, throat, and face: negative Respiratory: negative Cardiovascular: negative Gastrointestinal: negative Genitourinary:negative Integument/breast: negative Hematologic/lymphatic: negative Musculoskeletal:negative Neurological: positive for headaches Behavioral/Psych: negative Endocrine: negative Allergic/Immunologic: negative   PHYSICAL EXAMINATION: General appearance: alert, cooperative, and no distress Head: Normocephalic, without obvious abnormality, atraumatic Neck: no adenopathy, no JVD,  supple, symmetrical, trachea midline, and thyroid not enlarged, symmetric, no tenderness/mass/nodules Lymph nodes: Cervical, supraclavicular, and axillary nodes normal. Resp: clear to auscultation bilaterally Back: symmetric, no curvature. ROM normal. No CVA tenderness. Cardio: regular rate and rhythm, S1, S2 normal, no murmur, click, rub or gallop GI: soft, non-tender; bowel sounds normal; no masses,  no organomegaly Extremities: extremities normal, atraumatic, no cyanosis or edema Neurologic: Alert and oriented X 3, normal strength and tone. Normal symmetric reflexes. Normal coordination and gait  ECOG PERFORMANCE STATUS: 1 - Symptomatic but completely ambulatory  Blood pressure 116/71, pulse (!) 105, temperature (!) 97 F (36.1 C), temperature source Oral, resp. rate 18, weight 122 lb 5 oz (55.5 kg), SpO2 97 %.  LABORATORY DATA: Lab Results  Component Value Date   WBC 5.9 02/11/2021   HGB 10.7 (L) 02/11/2021   HCT 33.6 (L) 02/11/2021   MCV 86.2 02/11/2021   PLT 332 02/11/2021      Chemistry      Component Value Date/Time   NA 142 02/11/2021 0816   NA 144 08/03/2019 1527   K 4.0 02/11/2021 0816   CL 109 02/11/2021 0816   CO2 21 (L) 02/11/2021 0816   BUN 9 02/11/2021 0816   BUN 9 08/03/2019 1527   CREATININE 0.76 02/11/2021 0816      Component Value Date/Time   CALCIUM 9.9 02/11/2021 0816   ALKPHOS 61  02/11/2021 0816   AST 24 02/11/2021 0816   ALT 9 02/11/2021 0816   BILITOT 0.2 (L) 02/11/2021 0816       RADIOGRAPHIC STUDIES: No results found.  ASSESSMENT AND PLAN: This is a very pleasant 65 years old African-American male recently diagnosed with metastatic non-small cell lung cancer that was initially diagnosed as stage IIIA (Tx, N2, M0) non-small cell lung cancer, adenocarcinoma presented with large right paratracheal soft tissue mass extending from the right apex to the right suprahilar and precarinal region diagnosed in August 2021.  The patient developed brain  metastasis in April 2022. Molecular studies by [employer] showed no actionable mutations. The patient completed a course of concurrent chemoradiation with weekly carboplatin for AUC of 2 and paclitaxel 45 mg/M2.  He is status post 7 cycles.  Last dose was given February 28, 2020 The patient tolerated the previous treatment well and he had partial response. The patient is currently undergoing consolidation treatment with immunotherapy with Imfinzi 1500 mg IV every 4 weeks status post 5 cycles. 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 The patient is here today to start the first cycle of systemic chemotherapy with carboplatin for AUC of 5, Alimta 500 Mg/M2 and Keytruda 200 Mg IV every 3 weeks.  Status post 6 cycles.  Starting from cycle #5 the patient will be on maintenance treatment with Alimta and Keytruda every 3 weeks. The patient has been tolerating his treatment with maintenance Alimta and Keytruda fairly well with no concerning complaints. He had repeat CT scan of the chest, abdomen pelvis performed few days ago.  Unfortunately the final report is still pending.  I personally reviewed the scan images and discussed the results with the patient today.  I do not see any concerning findings for progression but we will wait for the final report for confirmation. I recommended for him to proceed with cycle #7 today as planned. I will see him back for follow-up visit in 3 weeks for evaluation before the next cycle of his treatment. The patient was advised to call immediately if he has any other concerning symptoms in the interval. The patient voices understanding of current disease status and treatment options and is in agreement with the current care plan.  All questions were answered. The patient knows to call the clinic with any problems, questions or concerns. We can certainly see the patient much sooner if necessary.  Disclaimer: This note  was dictated with voice recognition software. Similar sounding words can inadvertently be transcribed and may not be corrected upon review.

## 2021-02-11 NOTE — Progress Notes (Signed)
Ok to proceed with elevated HR per Dr.mohamed

## 2021-02-11 NOTE — Patient Instructions (Signed)
[facility]  Discharge Instructions: Thank you for choosing [facility] to provide your oncology and hematology care.   If you have a lab appointment with the [facility], please go directly to the [facility] and check in at the registration area.   Wear comfortable clothing and clothing appropriate for easy access to any Portacath or PICC line.   We strive to give you quality time with your provider. You may need to reschedule your appointment if you arrive late (15 or more minutes).  Arriving late affects you and other patients whose appointments are after yours.  Also, if you miss three or more appointments without notifying the office, you may be dismissed from the clinic at the provider's discretion.      For prescription refill requests, have your pharmacy contact our office and allow 72 hours for refills to be completed.    Today you received the following chemotherapy and/or immunotherapy agents Pembrolizumab and Pemetrexed       To help prevent nausea and vomiting after your treatment, we encourage you to take your nausea medication as directed.  BELOW ARE SYMPTOMS THAT SHOULD BE REPORTED IMMEDIATELY: *FEVER GREATER THAN 100.4 F (38 C) OR HIGHER *CHILLS OR SWEATING *NAUSEA AND VOMITING THAT IS NOT CONTROLLED WITH YOUR NAUSEA MEDICATION *UNUSUAL SHORTNESS OF BREATH *UNUSUAL BRUISING OR BLEEDING *URINARY PROBLEMS (pain or burning when urinating, or frequent urination) *BOWEL PROBLEMS (unusual diarrhea, constipation, pain near the anus) TENDERNESS IN MOUTH AND THROAT WITH OR WITHOUT PRESENCE OF ULCERS (sore throat, sores in mouth, or a toothache) UNUSUAL RASH, SWELLING OR PAIN  UNUSUAL VAGINAL DISCHARGE OR ITCHING   Items with * indicate a potential emergency and should be followed up as soon as possible or go to the Emergency Department if any problems should occur.  Please show the CHEMOTHERAPY ALERT CARD or IMMUNOTHERAPY ALERT  CARD at check-in to the Emergency Department and triage nurse.  Should you have questions after your visit or need to cancel or reschedule your appointment, please contact [facility]  Dept: [phone]  and follow the prompts.  Office hours are 8:00 a.m. to 4:30 p.m. Monday - Friday. Please note that voicemails left after 4:00 p.m. may not be returned until the following business day.  We are closed weekends and major holidays. You have access to a nurse at all times for urgent questions. Please call the main number to the clinic Dept: [phone] and follow the prompts.   For any non-urgent questions, you may also contact your provider using MyChart. We now offer e-Visits for anyone 28 and older to request care online for non-urgent symptoms. For details visit [email or web address].   Also download the MyChart app! Go to the app store, search "MyChart", open the app, select [facility], and log in with your MyChart username and password.  Due to Covid, a mask is required upon entering the hospital/clinic. If you do not have a mask, one will be given to you upon arrival. For doctor visits, patients may have 1 support person aged 27 or older with them. For treatment visits, patients cannot have anyone with them due to current Covid guidelines and our immunocompromised population.

## 2021-02-26 ENCOUNTER — Other Ambulatory Visit: Payer: Self-pay

## 2021-03-01 ENCOUNTER — Ambulatory Visit (HOSPITAL_COMMUNITY): Payer: Medicaid Other

## 2021-03-04 ENCOUNTER — Other Ambulatory Visit: Payer: Self-pay | Admitting: Medical Oncology

## 2021-03-04 ENCOUNTER — Inpatient Hospital Stay: Payer: Medicaid Other

## 2021-03-04 ENCOUNTER — Inpatient Hospital Stay: Payer: Medicaid Other | Attending: Internal Medicine | Admitting: Internal Medicine

## 2021-03-04 ENCOUNTER — Telehealth: Payer: Self-pay | Admitting: Internal Medicine

## 2021-03-04 ENCOUNTER — Other Ambulatory Visit: Payer: Self-pay

## 2021-03-04 ENCOUNTER — Encounter: Payer: Self-pay | Admitting: Internal Medicine

## 2021-03-04 VITALS — BP 114/70 | HR 106 | Temp 97.7°F | Resp 19 | Ht 61.0 in | Wt 123.8 lb

## 2021-03-04 VITALS — HR 100

## 2021-03-04 DIAGNOSIS — Z23 Encounter for immunization: Secondary | ICD-10-CM

## 2021-03-04 DIAGNOSIS — C7931 Secondary malignant neoplasm of brain: Secondary | ICD-10-CM | POA: Diagnosis not present

## 2021-03-04 DIAGNOSIS — Z7189 Other specified counseling: Secondary | ICD-10-CM

## 2021-03-04 DIAGNOSIS — Z5112 Encounter for antineoplastic immunotherapy: Secondary | ICD-10-CM | POA: Insufficient documentation

## 2021-03-04 DIAGNOSIS — Z79899 Other long term (current) drug therapy: Secondary | ICD-10-CM | POA: Insufficient documentation

## 2021-03-04 DIAGNOSIS — C3491 Malignant neoplasm of unspecified part of right bronchus or lung: Secondary | ICD-10-CM | POA: Insufficient documentation

## 2021-03-04 DIAGNOSIS — C3411 Malignant neoplasm of upper lobe, right bronchus or lung: Secondary | ICD-10-CM

## 2021-03-04 DIAGNOSIS — Z5111 Encounter for antineoplastic chemotherapy: Secondary | ICD-10-CM | POA: Diagnosis present

## 2021-03-04 DIAGNOSIS — Z95828 Presence of other vascular implants and grafts: Secondary | ICD-10-CM

## 2021-03-04 LAB — CBC WITH DIFFERENTIAL (CANCER CENTER ONLY)
Abs Immature Granulocytes: 0.01 10*3/uL (ref 0.00–0.07)
Basophils Absolute: 0 10*3/uL (ref 0.0–0.1)
Basophils Relative: 1 %
Eosinophils Absolute: 0.1 10*3/uL (ref 0.0–0.5)
Eosinophils Relative: 1 %
HCT: 34.1 % — ABNORMAL LOW (ref 39.0–52.0)
Hemoglobin: 11.1 g/dL — ABNORMAL LOW (ref 13.0–17.0)
Immature Granulocytes: 0 %
Lymphocytes Relative: 23 %
Lymphs Abs: 1 10*3/uL (ref 0.7–4.0)
MCH: 27 pg (ref 26.0–34.0)
MCHC: 32.6 g/dL (ref 30.0–36.0)
MCV: 83 fL (ref 80.0–100.0)
Monocytes Absolute: 0.6 10*3/uL (ref 0.1–1.0)
Monocytes Relative: 14 %
Neutro Abs: 2.6 10*3/uL (ref 1.7–7.7)
Neutrophils Relative %: 61 %
Platelet Count: 329 10*3/uL (ref 150–400)
RBC: 4.11 MIL/uL — ABNORMAL LOW (ref 4.22–5.81)
RDW: 14.9 % (ref 11.5–15.5)
WBC Count: 4.3 10*3/uL (ref 4.0–10.5)
nRBC: 0 % (ref 0.0–0.2)

## 2021-03-04 LAB — CMP (CANCER CENTER ONLY)
ALT: 13 U/L (ref 0–44)
AST: 24 U/L (ref 15–41)
Albumin: 3.4 g/dL — ABNORMAL LOW (ref 3.5–5.0)
Alkaline Phosphatase: 67 U/L (ref 38–126)
Anion gap: 10 (ref 5–15)
BUN: 6 mg/dL — ABNORMAL LOW (ref 8–23)
CO2: 23 mmol/L (ref 22–32)
Calcium: 9.5 mg/dL (ref 8.9–10.3)
Chloride: 107 mmol/L (ref 98–111)
Creatinine: 0.68 mg/dL (ref 0.61–1.24)
GFR, Estimated: >60 mL/min (ref >60)
Glucose, Bld: 93 mg/dL (ref 70–99)
Potassium: 3.7 mmol/L (ref 3.5–5.1)
Sodium: 140 mmol/L (ref 135–145)
Total Bilirubin: 0.3 mg/dL (ref 0.3–1.2)
Total Protein: 6.9 g/dL (ref 6.5–8.1)

## 2021-03-04 LAB — TSH: TSH: 1.447 u[IU]/mL (ref 0.320–4.118)

## 2021-03-04 MED ORDER — INFLUENZA VAC SPLIT QUAD 0.5 ML IM SUSY
0.5000 mL | PREFILLED_SYRINGE | Freq: Once | INTRAMUSCULAR | Status: AC
  Administered 2021-03-04: 0.5 mL via INTRAMUSCULAR
  Filled 2021-03-04: qty 0.5

## 2021-03-04 MED ORDER — SODIUM CHLORIDE 0.9% FLUSH
10.0000 mL | INTRAVENOUS | Status: DC | PRN
  Administered 2021-03-04: 10 mL

## 2021-03-04 MED ORDER — HEPARIN SOD (PORK) LOCK FLUSH 100 UNIT/ML IV SOLN
500.0000 [IU] | Freq: Once | INTRAVENOUS | Status: AC | PRN
  Administered 2021-03-04: 500 [IU]

## 2021-03-04 MED ORDER — SODIUM CHLORIDE 0.9 % IV SOLN: Freq: Once | INTRAVENOUS | Status: AC

## 2021-03-04 MED ORDER — SODIUM CHLORIDE 0.9 % IV SOLN
500.0000 mg/m2 | Freq: Once | INTRAVENOUS | Status: AC
  Administered 2021-03-04: 800 mg via INTRAVENOUS
  Filled 2021-03-04: qty 20

## 2021-03-04 MED ORDER — PROCHLORPERAZINE MALEATE 10 MG PO TABS
10.0000 mg | ORAL_TABLET | Freq: Once | ORAL | Status: AC
  Administered 2021-03-04: 10 mg via ORAL
  Filled 2021-03-04: qty 1

## 2021-03-04 MED ORDER — SODIUM CHLORIDE 0.9 % IV SOLN
200.0000 mg | Freq: Once | INTRAVENOUS | Status: AC
  Administered 2021-03-04: 200 mg via INTRAVENOUS
  Filled 2021-03-04: qty 8

## 2021-03-04 MED ORDER — SODIUM CHLORIDE 0.9% FLUSH
10.0000 mL | INTRAVENOUS | Status: AC | PRN
  Administered 2021-03-04: 10 mL

## 2021-03-04 NOTE — Progress Notes (Signed)
Per Dr Julien Nordmann , it is ok to treat pt today with alimta and keytruda and heart rate of 106.

## 2021-03-04 NOTE — Patient Instructions (Signed)
[facility]  Discharge Instructions: Thank you for choosing [facility] to provide your oncology and hematology care.   If you have a lab appointment with the Cancer Center, please go directly to the Cancer Center and check in at the registration area.   Wear comfortable clothing and clothing appropriate for easy access to any Portacath or PICC line.   We strive to give you quality time with your provider. You may need to reschedule your appointment if you arrive late (15 or more minutes).  Arriving late affects you and other patients whose appointments are after yours.  Also, if you miss three or more appointments without notifying the office, you may be dismissed from the clinic at the provider's discretion.      For prescription refill requests, have your pharmacy contact our office and allow 72 hours for refills to be completed.    Today you received the following chemotherapy and/or immunotherapy agents Pembrolizumab (Keytruda) and Pemetrexed (Alimta)      To help prevent nausea and vomiting after your treatment, we encourage you to take your nausea medication as directed.  BELOW ARE SYMPTOMS THAT SHOULD BE REPORTED IMMEDIATELY: *FEVER GREATER THAN 100.4 F (38 C) OR HIGHER *CHILLS OR SWEATING *NAUSEA AND VOMITING THAT IS NOT CONTROLLED WITH YOUR NAUSEA MEDICATION *UNUSUAL SHORTNESS OF BREATH *UNUSUAL BRUISING OR BLEEDING *URINARY PROBLEMS (pain or burning when urinating, or frequent urination) *BOWEL PROBLEMS (unusual diarrhea, constipation, pain near the anus) TENDERNESS IN MOUTH AND THROAT WITH OR WITHOUT PRESENCE OF ULCERS (sore throat, sores in mouth, or a toothache) UNUSUAL RASH, SWELLING OR PAIN  UNUSUAL VAGINAL DISCHARGE OR ITCHING   Items with * indicate a potential emergency and should be followed up as soon as possible or go to the Emergency Department if any problems should occur.  Please show the CHEMOTHERAPY ALERT CARD or  IMMUNOTHERAPY ALERT CARD at check-in to the Emergency Department and triage nurse.  Should you have questions after your visit or need to cancel or reschedule your appointment, please contact [facility]  Dept: [phone]  and follow the prompts.  Office hours are 8:00 a.m. to 4:30 p.m. Monday - Friday. Please note that voicemails left after 4:00 p.m. may not be returned until the following business day.  We are closed weekends and major holidays. You have access to a nurse at all times for urgent questions. Please call the main number to the clinic Dept: [phone] and follow the prompts.   For any non-urgent questions, you may also contact your provider using MyChart. We now offer e-Visits for anyone 18 and older to request care online for non-urgent symptoms. For details visit [email or web address].   Also download the MyChart app! Go to the app store, search "MyChart", open the app, select [facility], and log in with your MyChart username and password.  Due to Covid, a mask is required upon entering the hospital/clinic. If you do not have a mask, one will be given to you upon arrival. For doctor visits, patients may have 1 support person aged 18 or older with them. For treatment visits, patients cannot have anyone with them due to current Covid guidelines and our immunocompromised population.   

## 2021-03-04 NOTE — Telephone Encounter (Signed)
Scheduled per sch msg. Called and left msg  

## 2021-03-04 NOTE — Progress Notes (Signed)
[facility] [phone]   [phone]  OFFICE PROGRESS NOTE  [name], NP [id number] [id number]  DIAGNOSIS: Metastatic non-small cell lung cancer initially diagnosed as stage IIIA (Tx, N2, M0) non-small cell lung cancer, adenocarcinoma diagnosed in August 2021 and presented with large right paratracheal soft tissue mass extending from the right apex to the right suprahilar and precarinal region.  Molecular studies by [employer]:  [id number], 31.9%, Olaparib  [id number], 68.6%. None   BRAFAmplification, High (+++) Plasma Copy Number: 5.0  PRIOR THERAPY:  1) Concurrent chemoradiation with weekly carboplatin for AUC of 2 and paclitaxel 45 mg/M2.  First dose January 17, 2020.  Status post 7 cycles.  Last dose was given on February 28, 2020 2) Consolidation treatment with immunotherapy with Imfinzi 1500 mg IV every 4 weeks.  First dose April 09, 2020.  Status post 5 cycles.  Last dose was given July 30, 2020 discontinued secondary to disease progression with brain metastasis. 3) Stereotactic left frontoparietal craniotomy for resection of tumor on 08/30/2020.   CURRENT THERAPY: First-line systemic chemotherapy with carboplatin for AUC of 5, Alimta 500 Mg/M2 and Keytruda 200 Mg IV every 3 weeks.  First dose Oct 08, 2020.  Status post 7 cycles.  Starting from cycle #5 he will be on maintenance treatment with Alimta and Keytruda every 3 weeks.  INTERVAL HISTORY: [name] [name] 65 y.o. male returns to the clinic today for follow-up visit.  The patient is feeling fine today with no concerning complaints.  He continues to tolerate his treatment with Alimta and Keytruda fairly well.  He denied having any current chest pain, shortness of breath, cough or hemoptysis.  He denied having any fever or chills.  He has no nausea, vomiting, diarrhea or constipation.  He has intermittent headache but no visual changes.  He is here  today for evaluation before starting cycle #8 of his treatment.  MEDICAL HISTORY: Past Medical History:  Diagnosis Date   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[place]) 12/25/2019   Cancer ([place])    COPD (chronic obstructive pulmonary disease) ([place]) 11/02/2007   Qualifier: Diagnosis of  By: Melvyn Novas MD, Christena Deem    Hypertension 12/24/2019   Iron deficiency anemia 08/23/2007   Qualifier: Diagnosis of  By: Jim Like      ALLERGIES:  is allergic to other.  MEDICATIONS:  Current Outpatient Medications  Medication Sig Dispense Refill   acetaminophen (TYLENOL) 500 MG tablet Take 500 mg by mouth every 6 (six) hours as needed for mild pain or moderate pain.     budesonide-formoterol (SYMBICORT) 80-4.5 MCG/ACT inhaler Inhale 2 puffs into the lungs 2 (two) times daily. (Patient taking differently: Inhale 2 puffs into the lungs 2 (two) times daily as needed (for flares of wheezing and/or shortness of breath).) 1 Inhaler 11   feeding supplement (ENSURE ENLIVE / ENSURE PLUS) LIQD Take 237 mLs by mouth 2 (two) times daily between meals. 220 mL 12   folic acid (FOLVITE) 1 MG tablet Take 1 tablet (1 mg total) by mouth daily. 30 tablet 4   levETIRAcetam (KEPPRA) 500 MG tablet Take 1 tablet (500 mg total) by mouth 2 (two) times daily. 60 tablet 2   lidocaine-prilocaine (EMLA) cream Apply 1 application topically as needed. 30 g 1   Multiple Vitamin (MULTIVITAMIN) tablet Take 1 tablet by mouth daily.     pantoprazole (PROTONIX) 40 MG tablet Take 1 tablet (40 mg total) by mouth daily with supper. [place]  tablet 2   prochlorperazine (COMPAZINE) 10 MG tablet Take 1 tablet (10 mg total) by mouth every 6 (six) hours as needed for nausea or vomiting. 30 tablet 0   senna (SENOKOT) 8.6 MG TABS tablet Take 1 tablet (8.6 mg total) by mouth 2 (two) times daily. 120 tablet 0   tiotropium (SPIRIVA HANDIHALER) 18 MCG inhalation capsule Place 1 capsule into inhaler and inhale daily. 30 capsule 1   VENTOLIN HFA 108 (90 Base) MCG/ACT inhaler Inhale 2  puffs into the lungs every 4 (four) hours as needed for wheezing or shortness of breath.     No current facility-administered medications for this visit.    SURGICAL HISTORY:  Past Surgical History:  Procedure Laterality Date   APPLICATION OF CRANIAL NAVIGATION N/A 08/30/2020   Procedure: APPLICATION OF CRANIAL NAVIGATION;  Surgeon: Consuella Lose, MD;  Location: [facility];  Service: Neurosurgery;  Laterality: N/A;   CRANIOTOMY Left 08/30/2020   Procedure: Stereotactic left frontoparietal craniectomy for resection of tumor with brainlab;  Surgeon: Consuella Lose, MD;  Location: [facility];  Service: Neurosurgery;  Laterality: Left;   ENDOBRONCHIAL ULTRASOUND N/A 12/27/2019   Procedure: ENDOBRONCHIAL ULTRASOUND;  Surgeon: Laurin Coder, MD;  Location: [facility];  Service: Endoscopy;  Laterality: N/A;   FINE NEEDLE ASPIRATION  12/27/2019   Procedure: FINE NEEDLE ASPIRATION;  Surgeon: Laurin Coder, MD;  Location: [facility];  Service: Endoscopy;;   IR IMAGING GUIDED PORT INSERTION  11/30/2020   VIDEO BRONCHOSCOPY N/A 12/27/2019   Procedure: VIDEO BRONCHOSCOPY WITHOUT FLUORO;  Surgeon: Laurin Coder, MD;  Location: [facility];  Service: Endoscopy;  Laterality: N/A;    REVIEW OF SYSTEMS:  A comprehensive review of systems was negative except for: Neurological: positive for headaches   PHYSICAL EXAMINATION: General appearance: alert, cooperative, and no distress Head: Normocephalic, without obvious abnormality, atraumatic Neck: no adenopathy, no JVD, supple, symmetrical, trachea midline, and thyroid not enlarged, symmetric, no tenderness/mass/nodules Lymph nodes: Cervical, supraclavicular, and axillary nodes normal. Resp: clear to auscultation bilaterally Back: symmetric, no curvature. ROM normal. No CVA tenderness. Cardio: regular rate and rhythm, S1, S2 normal, no murmur, click, rub or gallop GI: soft, non-tender; bowel sounds normal; no masses,  no  organomegaly Extremities: extremities normal, atraumatic, no cyanosis or edema  ECOG PERFORMANCE STATUS: 1 - Symptomatic but completely ambulatory  Blood pressure 114/70, pulse (!) 106, temperature 97.7 F (36.5 C), temperature source Tympanic, resp. rate 19, height 5\' 1"  (1.549 m), weight 123 lb 12.8 oz (56.2 kg), SpO2 97 %.  LABORATORY DATA: Lab Results  Component Value Date   WBC 4.3 03/04/2021   HGB 11.1 (L) 03/04/2021   HCT 34.1 (L) 03/04/2021   MCV 83.0 03/04/2021   PLT 329 03/04/2021      Chemistry      Component Value Date/Time   NA 142 02/11/2021 0816   NA 144 08/03/2019 1527   K 4.0 02/11/2021 0816   CL 109 02/11/2021 0816   CO2 21 (L) 02/11/2021 0816   BUN 9 02/11/2021 0816   BUN 9 08/03/2019 1527   CREATININE 0.76 02/11/2021 0816      Component Value Date/Time   CALCIUM 9.9 02/11/2021 0816   ALKPHOS 61 02/11/2021 0816   AST 24 02/11/2021 0816   ALT 9 02/11/2021 0816   BILITOT 0.2 (L) 02/11/2021 0816       RADIOGRAPHIC STUDIES: CT Chest W Contrast  Result Date: 02/11/2021 CLINICAL DATA:  Metastatic non-small cell lung cancer restaging, smoker EXAM: CT CHEST, ABDOMEN, AND  PELVIS WITH CONTRAST TECHNIQUE: Multidetector CT imaging of the chest, abdomen and pelvis was performed following the standard protocol during bolus administration of intravenous contrast. CONTRAST:  2mL OMNIPAQUE IOHEXOL 350 MG/ML SOLN, additional oral enteric contrast COMPARISON:  12/07/2020 FINDINGS: CT CHEST FINDINGS Cardiovascular: Right chest port catheter. Aortic atherosclerosis. Normal heart size. Unchanged small pericardial effusion. Mediastinum/Nodes: No enlarged mediastinal, hilar, or axillary lymph nodes. Thyroid gland, trachea, and esophagus demonstrate no significant findings. Lungs/Pleura: Unchanged paramedian soft tissue thickening about the right apex, dominant component measuring 2.7 x 2.2 cm (series 2, image 10). A spiculated nodule of the right lower lobe is slightly  decreased in size, measuring 0.7 x 0.6 cm, previously 0.9 x 0.9 cm (series 6, image 73). Spiculated nodules in the left lower lobe are unchanged, superiorly measuring 0.9 x 0.9 cm (series 6, image 45), and inferiorly and posteriorly measuring 2.4 x 2.2 cm (series 6, image 81) and 1.9 x 1.3 cm (series 6, image 87). Redemonstrated fibrotic scarring, bronchiectasis and pneumatoceles of the anterior left upper lobe with severe volume loss (series 6, image 25). No pleural effusion or pneumothorax. Musculoskeletal: No chest wall mass or suspicious bone lesions identified. CT ABDOMEN PELVIS FINDINGS Hepatobiliary: No solid liver abnormality is seen. No gallstones, gallbladder wall thickening, or biliary dilatation. Pancreas: Unremarkable. No pancreatic ductal dilatation or surrounding inflammatory changes. Spleen: Normal in size without significant abnormality. Adrenals/Urinary Tract: Adrenal glands are unremarkable. Kidneys are normal, without renal calculi, solid lesion, or hydronephrosis. Thickening of the urinary bladder wall, likely related to chronic outlet obstruction. Stomach/Bowel: Stomach is within normal limits. Appendix appears normal. No evidence of bowel wall thickening, distention, or inflammatory changes. Vascular/Lymphatic: Aortic atherosclerosis. No enlarged abdominal or pelvic lymph nodes. Reproductive: Prostatomegaly. Other: No abdominal wall hernia or abnormality. No abdominopelvic ascites. Musculoskeletal: No acute or significant osseous findings. 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 Stable, incidental bone island of the right ilium (series 2, image 81). IMPRESSION: 1. Unchanged post treatment appearance of paramedian soft tissue thickening about the right apex. 2. A spiculated nodule of the right lower lobe is slightly decreased in size, measuring 0.7 x 0.6 cm, previously 0.9 x 0.9 cm. 3. Spiculated nodules in the left lower lobe are unchanged. 4. Findings are  overall consistent with stable to slightly improved metastatic disease. 5. No evidence of metastatic disease in the abdomen or pelvis. 6. Redemonstrated fibrotic scarring, bronchiectasis and pneumatoceles of the anterior left upper lobe with severe volume loss, most likely sequelae of remote prior infection or inflammation. 7. Prostatomegaly with thickening of the urinary bladder wall, likely related to chronic outlet obstruction. Aortic Atherosclerosis (ICD10-I70.0). Electronically Signed   By: Eddie Candle M.D.   On: 02/11/2021 11:37   CT Abdomen Pelvis W Contrast  Result Date: 02/11/2021 CLINICAL DATA:  Metastatic non-small cell lung cancer restaging, smoker EXAM: CT CHEST, ABDOMEN, AND PELVIS WITH CONTRAST TECHNIQUE: Multidetector CT imaging of the chest, abdomen and pelvis was performed following the standard protocol during bolus administration of intravenous contrast. CONTRAST:  59mL OMNIPAQUE IOHEXOL 350 MG/ML SOLN, additional oral enteric contrast COMPARISON:  12/07/2020 FINDINGS: CT CHEST FINDINGS Cardiovascular: Right chest port catheter. Aortic atherosclerosis. Normal heart size. Unchanged small pericardial effusion. Mediastinum/Nodes: No enlarged mediastinal, hilar, or axillary lymph nodes. Thyroid gland, trachea, and esophagus demonstrate no significant findings. Lungs/Pleura: Unchanged paramedian soft tissue thickening about the right apex, dominant component measuring 2.7 x 2.2 cm (series 2, image 10). A spiculated nodule of the right lower lobe is slightly decreased  in size, measuring 0.7 x 0.6 cm, previously 0.9 x 0.9 cm (series 6, image 73). Spiculated nodules in the left lower lobe are unchanged, superiorly measuring 0.9 x 0.9 cm (series 6, image 45), and inferiorly and posteriorly measuring 2.4 x 2.2 cm (series 6, image 81) and 1.9 x 1.3 cm (series 6, image 87). Redemonstrated fibrotic scarring, bronchiectasis and pneumatoceles of the anterior left upper lobe with severe volume loss (series  6, image 25). No pleural effusion or pneumothorax. Musculoskeletal: No chest wall mass or suspicious bone lesions identified. CT ABDOMEN PELVIS FINDINGS Hepatobiliary: No solid liver abnormality is seen. No gallstones, gallbladder wall thickening, or biliary dilatation. Pancreas: Unremarkable. No pancreatic ductal dilatation or surrounding inflammatory changes. Spleen: Normal in size without significant abnormality. Adrenals/Urinary Tract: Adrenal glands are unremarkable. Kidneys are normal, without renal calculi, solid lesion, or hydronephrosis. Thickening of the urinary bladder wall, likely related to chronic outlet obstruction. Stomach/Bowel: Stomach is within normal limits. Appendix appears normal. No evidence of bowel wall thickening, distention, or inflammatory changes. Vascular/Lymphatic: Aortic atherosclerosis. No enlarged abdominal or pelvic lymph nodes. Reproductive: Prostatomegaly. Other: No abdominal wall hernia or abnormality. No abdominopelvic ascites. Musculoskeletal: No acute or significant osseous findings. 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 Stable, incidental bone island of the right ilium (series 2, image 81). IMPRESSION: 1. Unchanged post treatment appearance of paramedian soft tissue thickening about the right apex. 2. A spiculated nodule of the right lower lobe is slightly decreased in size, measuring 0.7 x 0.6 cm, previously 0.9 x 0.9 cm. 3. Spiculated nodules in the left lower lobe are unchanged. 4. Findings are overall consistent with stable to slightly improved metastatic disease. 5. No evidence of metastatic disease in the abdomen or pelvis. 6. Redemonstrated fibrotic scarring, bronchiectasis and pneumatoceles of the anterior left upper lobe with severe volume loss, most likely sequelae of remote prior infection or inflammation. 7. Prostatomegaly with thickening of the urinary bladder wall, likely related to chronic outlet obstruction. Aortic  Atherosclerosis (ICD10-I70.0). Electronically Signed   By: Eddie Candle M.D.   On: 02/11/2021 11:37    ASSESSMENT AND PLAN: This is a very pleasant 65 years old African-American male recently diagnosed with metastatic non-small cell lung cancer that was initially diagnosed as stage IIIA (Tx, N2, M0) non-small cell lung cancer, adenocarcinoma presented with large right paratracheal soft tissue mass extending from the right apex to the right suprahilar and precarinal region diagnosed in August 2021.  The patient developed brain metastasis in April 2022. Molecular studies by [employer] showed no actionable mutations. The patient completed a course of concurrent chemoradiation with weekly carboplatin for AUC of 2 and paclitaxel 45 mg/M2.  He is status post 7 cycles.  Last dose was given February 28, 2020 The patient tolerated the previous treatment well and he had partial response. The patient is currently undergoing consolidation treatment with immunotherapy with Imfinzi 1500 mg IV every 4 weeks status post 5 cycles. 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 The patient is here today to start the first cycle of systemic chemotherapy with carboplatin for AUC of 5, Alimta 500 Mg/M2 and Keytruda 200 Mg IV every 3 weeks.  Status post 7 cycles.  Starting from cycle #5 the patient will be on maintenance treatment with Alimta and Keytruda every 3 weeks. He has been tolerating his maintenance treatment fairly well with no concerning adverse effects. I recommended for the patient to proceed with cycle #8 today  as planned. I will see him back for follow-up visit in 3 weeks for evaluation before the next cycle of his treatment. We will continue to monitor his TSH closely for any signs of thyroid disorder with immunotherapy. The patient will receive flu vaccine today. The patient was advised to call immediately if he has any other concerning symptoms in the  interval. The patient voices understanding of current disease status and treatment options and is in agreement with the current care plan.  All questions were answered. The patient knows to call the clinic with any problems, questions or concerns. We can certainly see the patient much sooner if necessary.  Disclaimer: This note was dictated with voice recognition software. Similar sounding words can inadvertently be transcribed and may not be corrected upon review.

## 2021-03-05 ENCOUNTER — Other Ambulatory Visit: Payer: Self-pay

## 2021-03-06 ENCOUNTER — Ambulatory Visit: Payer: Medicaid Other | Admitting: Urology

## 2021-03-07 ENCOUNTER — Other Ambulatory Visit (HOSPITAL_COMMUNITY): Payer: Self-pay | Admitting: Neurosurgery

## 2021-03-07 DIAGNOSIS — C7931 Secondary malignant neoplasm of brain: Secondary | ICD-10-CM

## 2021-03-08 ENCOUNTER — Ambulatory Visit (HOSPITAL_COMMUNITY): Payer: Medicaid Other

## 2021-03-11 ENCOUNTER — Inpatient Hospital Stay: Payer: Medicaid Other

## 2021-03-11 ENCOUNTER — Other Ambulatory Visit: Payer: Self-pay

## 2021-03-13 ENCOUNTER — Ambulatory Visit (HOSPITAL_COMMUNITY)
Admission: RE | Admit: 2021-03-13 | Discharge: 2021-03-13 | Disposition: A | Discharge status: Home / Self Care | Payer: Medicaid Other | Source: Ambulatory Visit | Attending: Radiation Oncology | Admitting: Radiation Oncology

## 2021-03-13 ENCOUNTER — Ambulatory Visit: Payer: Medicaid Other | Admitting: Urology

## 2021-03-13 ENCOUNTER — Encounter (HOSPITAL_COMMUNITY): Payer: Self-pay

## 2021-03-13 DIAGNOSIS — C7931 Secondary malignant neoplasm of brain: Secondary | ICD-10-CM | POA: Insufficient documentation

## 2021-03-13 MED ORDER — HEPARIN SOD (PORK) LOCK FLUSH 100 UNIT/ML IV SOLN
INTRAVENOUS | Status: AC
  Administered 2021-03-13: 500 [IU] via INTRAVENOUS
  Filled 2021-03-13: qty 5

## 2021-03-13 MED ORDER — HEPARIN SOD (PORK) LOCK FLUSH 100 UNIT/ML IV SOLN: 500.0000 [IU] | Freq: Once | INTRAVENOUS | Status: AC

## 2021-03-13 MED ORDER — IOHEXOL 350 MG/ML SOLN
60.0000 mL | Freq: Once | INTRAVENOUS | Status: AC | PRN
  Administered 2021-03-13: 60 mL via INTRAVENOUS

## 2021-03-14 ENCOUNTER — Inpatient Hospital Stay: Payer: Medicaid Other | Admitting: Nutrition

## 2021-03-14 ENCOUNTER — Telehealth: Payer: Self-pay | Admitting: Nutrition

## 2021-03-14 NOTE — Telephone Encounter (Signed)
Rescheduled per 10/27 pt request, pt confirmed new appt

## 2021-03-18 ENCOUNTER — Inpatient Hospital Stay: Payer: Medicaid Other

## 2021-03-19 ENCOUNTER — Ambulatory Visit
Admission: RE | Admit: 2021-03-19 | Discharge: 2021-03-19 | Disposition: A | Discharge status: Home / Self Care | Payer: Medicaid Other | Source: Ambulatory Visit | Attending: Urology | Admitting: Urology

## 2021-03-19 ENCOUNTER — Encounter: Payer: Self-pay | Admitting: Urology

## 2021-03-19 DIAGNOSIS — Z510 Encounter for antineoplastic radiation therapy: Secondary | ICD-10-CM | POA: Insufficient documentation

## 2021-03-19 DIAGNOSIS — C7931 Secondary malignant neoplasm of brain: Secondary | ICD-10-CM | POA: Insufficient documentation

## 2021-03-19 DIAGNOSIS — C3490 Malignant neoplasm of unspecified part of unspecified bronchus or lung: Secondary | ICD-10-CM | POA: Insufficient documentation

## 2021-03-19 NOTE — Progress Notes (Signed)
Patient reports exertion induced fatigue but otherwise doing well. No other symptoms reported this time.  Meaningful use complete.  Patient notified of 11:30am -03/19/21 telephone appointment and verbalized understanding.

## 2021-03-19 NOTE — Progress Notes (Signed)
  Radiation Oncology         [phone] ________________________________  Name: Tavish Gettis MRN: [id number]  Date: 03/21/2021  DOB: 08/20/1955  SIMULATION AND TREATMENT PLANNING NOTE    ICD-10-CM   1. Brain metastasis ([place])  C79.31       DIAGNOSIS:  65 y.o. 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   NARRATIVE:  The patient was brought to the [facility].  Identity was confirmed.  All relevant records and images related to the planned course of therapy were reviewed.  The patient freely provided informed written consent to proceed with treatment after reviewing the details related to the planned course of therapy. The consent form was witnessed and verified by the simulation staff. Intravenous access was established for contrast administration. Then, the patient was set-up in a stable reproducible supine position for radiation therapy.  A relocatable thermoplastic stereotactic head frame was fabricated for precise immobilization.  CT images were obtained.  Surface markings were placed.  The CT images were loaded into the planning software and fused with the patient's targeting MRI scan.  Then the target and avoidance structures were contoured.  Treatment planning then occurred.  The radiation prescription was entered and confirmed.  I have requested 3D planning  I have requested a DVH of the following structures: Brain stem, brain, left eye, right eye, lenses, optic chiasm, target volumes, uninvolved brain, and normal tissue.    SPECIAL TREATMENT PROCEDURE:  The planned course of therapy using radiation constitutes a special treatment procedure. Special care is required in the management of this patient for the following reasons. This treatment constitutes a Special Treatment Procedure for the following reason: High dose per fraction requiring special monitoring for increased toxicities of  treatment including daily imaging.  The special nature of the planned course of radiotherapy will require increased physician supervision and oversight to ensure patient's safety with optimal treatment outcomes.  This requires extended time and effort.  PLAN:  The patient will receive 20 Gy in 1 fraction.  ________________________________  Sheral Apley Tammi Klippel, M.D.

## 2021-03-19 NOTE — Progress Notes (Signed)
Radiation Oncology         [phone]) [phone] ________________________________  Name: [name] [name] MRN: [id number]  Date: 03/19/2021  DOB: 1955/08/10  Post Treatment Note  CC: [name], NP  [name], MD  Diagnosis:   65 y.o. 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   Interval Since Last Radiation:  7 months  08/30/20 Pre-Op SRS:  The 3.2 cm target in the left parietal vertex was treated to 18 Gy in a single fraction, followed by surgical resection with Dr. Kathyrn Sheriff on 08/31/20.  01/17/20 - 03/02/20: The patient was treated to the disease within the right lung initially to a dose of 60 Gy using a 3 field, IMRT technique. The patient then received a cone down boost treatment for an additional 6 Gy. This yielded a final total dose of 66 Gy.  Narrative:  In summary, he has been under the care of Dr. 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 He was treated with concurrent chemoradiation, with daily IMRT under the care of Dr. Lisbeth Renshaw and weekly carboplatin/paclitaxel (7 cycles). 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   He contacted Dr. Worthy Flank office on 08/23/19 with concerns regarding new onset right upper and lower extremity weakness. He was referred to the ED at that time and underwent non-contrast head CT showing a new 3.1 cm mass lesion in left posterior parietal convexity with a large area of surrounding vasogenic edema and associated mass effect but no intracranial hemorrhage.  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 Due to the aneurysm  clip, he was unable to undergo MRI. They proceeded with contrast head CT showing a 3.2 cm intra-axial mass lesion at the parietal vertex on the left with regional vasogenic edema. He met with Dr. Kathyrn Sheriff, neurosurgery, and the recommendation was to proceed with pre-op SRS followed by surgical resection of the mass which the patient was in agreement with.  Pre-op SRS was completed on 08/30/20 followed by surgical resection on 08/31/20. He tolerated the pre-op SRS well, without any acute ill side effects.  His systemic therapy was changed to carboplatin, Alimta and Keytruda with his first dose on 10/08/20. He had a posttreatment CT head on 11/29/2020 which showed substantial decrease in the left cerebral edema at the vertex with residual hypoattenuation underlying the craniotomy but no nodular enhancement at this location. There was no evidence of recurrent or new metastatic disease.  Starting from cycle #5 of his systemic therapy, he was switched to maintenance treatment with Alimta and Keytruda which he has continued to tolerate well.  His most recent systemic imaging with CT C/A/P for disease restaging on 02/08/21 showed overall disease stability with stable to slightly improved metastatic disease in the chest and without any evidence of metastatic disease in the abdomen or pelvis. On his most recent post treatment follow up CT Head, performed 03/13/21, there is a new 1.1 cm enhancing lesion of the parasagittal left parietal lobe with mild adjacent edema. The surgical cavity of the high left parietal lobe has contracted and there is thin enhancement at the margins as well as adjacent dural enhancement but no  nodular enhancement to suggest recurrent disease at the surgical site.  His case and imaging was reviewed in the recent multidisciplinary brain conference on 03/18/21 so I called him today to review the recommendations by telephone.                On review of systems, the patient states that he is doing well  in general. He reports occasional mild headaches that respond to Tylenol as needed.  He also continues with mild fatigue but feels that this is gradually improving.  He denies any nausea, vomiting, dizziness, changes in visual or auditory acuity, tremors or seizure activity.   His appetite is variable but he continues to eat regularly to maintain his weight. He continues with an unsteady gait and weakness in the right lower extremity.  He had a Port-A-Cath placed on 11/30/20 for continued systemic therapy which he feels he is tolerating well. Otherwise, he is without complaints.   ALLERGIES:  is allergic to other.  Meds: Current Outpatient Medications  Medication Sig Dispense Refill   acetaminophen (TYLENOL) 500 MG tablet Take 500 mg by mouth every 6 (six) hours as needed for mild pain or moderate pain.     budesonide-formoterol (SYMBICORT) 80-4.5 MCG/ACT inhaler Inhale 2 puffs into the lungs 2 (two) times daily. (Patient taking differently: Inhale 2 puffs into the lungs 2 (two) times daily as needed (for flares of wheezing and/or shortness of breath).) 1 Inhaler 11   feeding supplement (ENSURE ENLIVE / ENSURE PLUS) LIQD Take 237 mLs by mouth 2 (two) times daily between meals. 497 mL 12   folic acid (FOLVITE) 1 MG tablet Take 1 tablet (1 mg total) by mouth daily. 30 tablet 4   levETIRAcetam (KEPPRA) 500 MG tablet Take 1 tablet (500 mg total) by mouth 2 (two) times daily. 60 tablet 2   lidocaine-prilocaine (EMLA) cream Apply 1 application topically as needed. 30 g 1   Multiple Vitamin (MULTIVITAMIN) tablet Take 1 tablet by mouth daily.     pantoprazole (PROTONIX) 40 MG tablet Take 1 tablet (40 mg total) by mouth daily with supper. 30 tablet 2   prochlorperazine (COMPAZINE) 10 MG tablet Take 1 tablet (10 mg total) by mouth every 6 (six) hours as needed for nausea or vomiting. 30 tablet 0   senna (SENOKOT) 8.6 MG TABS tablet Take 1 tablet (8.6 mg total) by mouth 2 (two) times daily. 120 tablet 0    tiotropium (SPIRIVA HANDIHALER) 18 MCG inhalation capsule Place 1 capsule into inhaler and inhale daily. 30 capsule 1   VENTOLIN HFA 108 (90 Base) MCG/ACT inhaler Inhale 2 puffs into the lungs every 4 (four) hours as needed for wheezing or shortness of breath.     No current facility-administered medications for this encounter.    Physical Findings:  vitals were not taken for this visit.  Pain Assessment Pain Score: 0-No pain/ Unable to assess due to telephone follow up visit format.    Lab Findings: Lab Results  Component Value Date   WBC 4.3 03/04/2021   HGB 11.1 (L) 03/04/2021   HCT 34.1 (L) 03/04/2021   MCV 83.0 03/04/2021   PLT 329 03/04/2021     Radiographic Findings: CT HEAD W & WO CONTRAST (5MM)  Result Date: 03/14/2021 CLINICAL DATA:  Metastatic lung cancer, follow-up EXAM: CT HEAD WITHOUT AND WITH CONTRAST TECHNIQUE: Contiguous axial images were obtained from the base of the skull through the vertex without and with intravenous contrast CONTRAST:  48mL OMNIPAQUE IOHEXOL 350 MG/ML SOLN  COMPARISON:  11/29/2020 FINDINGS: Brain: There is a new 1.1 cm enhancing lesion of the parasagittal left parietal lobe with mild adjacent edema. Surgical cavity of the high left parietal lobe has contracted. There is thin enhancement at the margins as well as adjacent dural enhancement. No nodular enhancement to suggest recurrent disease. Remaining but decreased adjacent hypoattenuation likely reflects gliosis. There is no acute intracranial hemorrhage or significant mass effect. Right temporal encephalomalacia. Small chronic right caudate infarct. Vascular: Remote aneurysm clipping.  No new finding. Skull: Calvarium is unremarkable apart from bilateral craniotomies. Sinuses/Orbits: No acute finding. Other: Right mastoid tip opacification. IMPRESSION: New 1.1 cm metastasis of the parasagittal left parietal lobe with mild edema. No evidence of recurrent disease at the operative site. Electronically  Signed   By: Macy Mis M.D.   On: 03/14/2021 19:57    Impression/Plan: 1. 65 y.o. 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   He has recovered well from the effects of his recent preop SRS treatment and is currently without complaints.  He has continued in routine follow-up under the care and direction of Dr. Earlie Server and continues on maintenance therapy with Alimta and Keytruda, recently completed cycle #8. His most recent systemic imaging appeared stable to improved. Unfortunately, his most recent post-treatment CT Head shows a new 1.1 cm enhancing lesion of the parasagittal left parietal lobe with mild adjacent edema. The surgical cavity of the high left parietal lobe has contracted and there is thin enhancement at the margins as well as adjacent dural enhancement but no nodular enhancement to suggest recurrent disease at the surgical site.  His case and imaging were reviewed in the recent multidisciplinary brain conference on 03/18/21 and consensus recommendation is to proceed with salvage SRS in a single fraction to treat the new lesion.  We discussed the findings and recommendations today by telephone and he is in agreement to proceed. He was encouraged to ask questions that were answered to his stated satisfaction.  He appears to have a good understanding of these recommendations and is in agreement with the stated plan.  We will call his friend, Vickii Chafe, to coordinate his care since she helps him with transportation. He knows that he is welcome to call at anytime in the interim with any questions or concerns related to his previous radiation.    Nicholos Johns, PA-C

## 2021-03-20 ENCOUNTER — Ambulatory Visit: Payer: Medicaid Other | Admitting: Urology

## 2021-03-21 ENCOUNTER — Ambulatory Visit
Admission: RE | Admit: 2021-03-21 | Discharge: 2021-03-21 | Disposition: A | Discharge status: Home / Self Care | Payer: Medicaid Other | Source: Ambulatory Visit | Attending: Radiation Oncology | Admitting: Radiation Oncology

## 2021-03-21 VITALS — BP 119/77 | HR 76 | Temp 98.0°F | Resp 20 | Wt 121.6 lb

## 2021-03-21 DIAGNOSIS — C7931 Secondary malignant neoplasm of brain: Secondary | ICD-10-CM | POA: Diagnosis present

## 2021-03-21 DIAGNOSIS — C3490 Malignant neoplasm of unspecified part of unspecified bronchus or lung: Secondary | ICD-10-CM | POA: Diagnosis present

## 2021-03-21 DIAGNOSIS — G9389 Other specified disorders of brain: Secondary | ICD-10-CM

## 2021-03-21 DIAGNOSIS — Z510 Encounter for antineoplastic radiation therapy: Secondary | ICD-10-CM | POA: Diagnosis present

## 2021-03-21 MED ORDER — HEPARIN SOD (PORK) LOCK FLUSH 10 UNIT/ML IV SOLN
10.0000 [IU] | Freq: Once | INTRAVENOUS | Status: DC
  Filled 2021-03-21: qty 5

## 2021-03-21 MED ORDER — HEPARIN SOD (PORK) LOCK FLUSH 100 UNIT/ML IV SOLN
500.0000 [IU] | Freq: Once | INTRAVENOUS | Status: AC
  Administered 2021-03-21: 500 [IU] via INTRAVENOUS

## 2021-03-21 MED ORDER — SODIUM CHLORIDE 0.9% FLUSH
10.0000 mL | Freq: Once | INTRAVENOUS | Status: AC
  Administered 2021-03-21: 10 mL via INTRAVENOUS

## 2021-03-25 ENCOUNTER — Encounter: Payer: Self-pay | Admitting: Nutrition

## 2021-03-25 ENCOUNTER — Other Ambulatory Visit: Payer: Self-pay

## 2021-03-25 ENCOUNTER — Inpatient Hospital Stay: Payer: Medicaid Other | Attending: Internal Medicine | Admitting: Nutrition

## 2021-03-25 DIAGNOSIS — Z510 Encounter for antineoplastic radiation therapy: Secondary | ICD-10-CM | POA: Diagnosis not present

## 2021-03-25 NOTE — Progress Notes (Signed)
65 year old male diagnosed with metastatic non-small cell lung cancer.  He is a patient of Dr. Julien Nordmann.  Past medical history includes emphysema, COPD, hypertension, IDA.  Medications include Folvite, multivitamin, Protonix, Compazine, and Senokot.  Labs include BUN 6 and albumin 3.4 on October 17.  Height: 5 feet 1 inch. Weight: 122.6 pounds. Usual body weight: Approximately 131 pounds. BMI: 23.17. ECOG: 1.  Presents to nutrition consult with rolling walker.  He comes alone. Patient reports his appetite comes and goes. He denies nausea and vomiting.  Denies problems chewing or swallowing as well as constipation and diarrhea. Reports he lives alone and generally will have 2 eggs with coffee in the morning or sometimes bologna sandwich.  He often eats something like beef ravioli for lunch and dinner.  This is easy to reheat.  States he has both an oven and a microwave to prepare foods. He declines assistance with food from the food pantry at this time.  He states he has canned goods at home. He drinks Ensure without difficulty.  He tries to drink 2 cartons daily.  Nutrition diagnosis: Unintended weight loss related to inadequate oral intake as evidenced by 6% weight loss from usual body weight.  Intervention: Educated on importance of consuming increased calories and protein in smaller more frequent meals and snacks. Reviewed strategies for adding calories and protein at breakfast, lunch and dinner. Recommended patient consume Ensure Plus or equivalent 2-3 cartons daily between meals. Provided 1 complementary case of Ensure Plus.  Provided coupons. Encourage patient to request food from the food pantry if he is in need.   Questions were answered.  Teach back method used.  Contact information given.  Monitoring, evaluation, goals: Patient will tolerate increased calories and protein to minimize further weight loss.  Next visit: To be scheduled as needed.  **Disclaimer: This note was  dictated with voice recognition software. Similar sounding words can inadvertently be transcribed and this note may contain transcription errors which may not have been corrected upon publication of note.**

## 2021-03-26 ENCOUNTER — Ambulatory Visit
Admission: RE | Admit: 2021-03-26 | Discharge: 2021-03-26 | Disposition: A | Discharge status: Home / Self Care | Payer: Medicaid Other | Source: Ambulatory Visit | Attending: Radiation Oncology | Admitting: Radiation Oncology

## 2021-03-26 ENCOUNTER — Encounter: Payer: Self-pay | Admitting: Radiation Oncology

## 2021-03-26 VITALS — BP 128/83 | HR 91 | Temp 97.9°F | Resp 18

## 2021-03-26 DIAGNOSIS — C7931 Secondary malignant neoplasm of brain: Secondary | ICD-10-CM

## 2021-03-26 DIAGNOSIS — Z510 Encounter for antineoplastic radiation therapy: Secondary | ICD-10-CM | POA: Diagnosis not present

## 2021-03-26 NOTE — Op Note (Signed)
Name: [name] [name]    MRN: [id number]   Date: 03/26/2021    DOB: [birth year]   STEREOTACTIC RADIOSURGERY OPERATIVE NOTE  PRE-OPERATIVE DIAGNOSIS:  Metastatic non-small cell lung CA  POST-OPERATIVE DIAGNOSIS:  Same  PROCEDURE:  Stereotactic Radiosurgery  SURGEON:  Consuella Lose, MD  RADIATION ONCOLOGIST: Dr. Tyler Pita, MD  TECHNIQUE:  The patient underwent a radiation treatment planning session in the radiation oncology simulation suite under the care of the radiation oncology physician and physicist.  I participated closely in the radiation treatment planning afterwards. The patient underwent planning CT.  These images were fused on the planning system.  We contoured the gross target volumes and subsequently expanded this to yield the Planning Target Volume. I actively participated in the planning process.  I helped to define and review the target contours and also the contours of the optic pathway, eyes, brainstem and selected nearby organs at risk.  All the dose constraints for critical structures were reviewed and compared to AAPM Task Group 101.  The prescription dose conformity was reviewed.  I approved the plan electronically.    Accordingly, [name] [name]  was brought to the TrueBeam stereotactic radiation treatment linac and placed in the custom immobilization mask.  The patient was aligned according to the IR fiducial markers with BrainLab Exactrac, then orthogonal x-rays were used in ExacTrac with the 6DOF robotic table and the shifts were made to align the patient  [name] [name] received stereotactic radiosurgery to a prescription dose of 20Gy to the left occipital lesion uneventfully.    The detailed description of the procedure is recorded in the radiation oncology procedure note.  I was present for the duration of the procedure.  DISPOSITION:   Following delivery, the patient was transported to nursing in stable condition and monitored for possible acute effects  to be discharged to home in stable condition with follow-up in one month.  Consuella Lose, MD [facility]

## 2021-03-26 NOTE — Progress Notes (Signed)
Patient in for [facility] Brain course #2 treatment observation for 30 minutes.  No signs or symptoms of distress.  Denies pain.  Ambulation with walker no difficulty observed at this time.  Patient was accompanied back upstairs to lobby where he will wait for transportation home.

## 2021-03-28 ENCOUNTER — Ambulatory Visit: Payer: Medicaid Other | Admitting: Radiation Oncology

## 2021-03-28 NOTE — Progress Notes (Signed)
  Radiation Oncology         [id number]) [phone] ________________________________  Stereotactic Treatment Procedure Note  Name: Arsenio Schnorr MRN: [id number]  Date: 03/26/2021  DOB: May 10, 1956  SPECIAL TREATMENT PROCEDURE    ICD-10-CM   1. Brain metastasis ([place])  C79.31       3D TREATMENT PLANNING AND DOSIMETRY:  The patient's radiation plan was reviewed and approved by neurosurgery and radiation oncology prior to treatment.  It showed 3-dimensional radiation distributions overlaid onto the planning CT/MRI image set.  The [facility] for the target structures as well as the organs at risk were reviewed. The documentation of the 3D plan and dosimetry are filed in the radiation oncology EMR.  NARRATIVE:  Cesareo Vickrey was brought to the TrueBeam stereotactic radiation treatment machine and placed supine on the CT couch. The head frame was applied, and the patient was set up for stereotactic radiosurgery.  Neurosurgery was present for the set-up and delivery  SIMULATION VERIFICATION:  In the couch zero-angle position, the patient underwent Exactrac imaging using the Brainlab system with orthogonal KV images.  These were carefully aligned and repeated to confirm treatment position for each of the isocenters.  The Exactrac snap film verification was repeated at each couch angle.  PROCEDURE: [name] received stereotactic radiosurgery to the following targets: Left parietal 1.1 cm target was treated using 4 Rapid Arc VMAT Beams to a prescription dose of 20 Gy.  ExacTrac registration was performed for each couch angle.  The 100% isodose line was prescribed.  6 MV X-rays were delivered in the flattening filter free beam mode.  STEREOTACTIC TREATMENT MANAGEMENT:  Following delivery, the patient was transported to nursing in stable condition and monitored for possible acute effects.  Vital signs were recorded BP 128/83 (BP Location: Right Arm, Patient Position: Sitting, Cuff Size: Normal)   Pulse 91    Temp 97.9 F (36.6 C) (Oral)   Resp 18   SpO2 100% . The patient tolerated treatment without significant acute effects, and was discharged to home in stable condition.    PLAN: Follow-up in one month.  ________________________________  Sheral Apley. Tammi Klippel, M.D.

## 2021-03-29 ENCOUNTER — Ambulatory Visit: Payer: Medicaid Other | Admitting: Radiation Oncology

## 2021-04-01 ENCOUNTER — Emergency Department (HOSPITAL_COMMUNITY): Payer: Medicaid Other

## 2021-04-01 ENCOUNTER — Inpatient Hospital Stay (HOSPITAL_COMMUNITY)
Admission: EM | Admit: 2021-04-01 | Discharge: 2021-04-15 | DRG: 054 | Disposition: A | Discharge status: Skilled Nursing Facility | Payer: Medicaid Other | Attending: Internal Medicine | Admitting: Internal Medicine

## 2021-04-01 ENCOUNTER — Encounter (HOSPITAL_COMMUNITY): Payer: Self-pay

## 2021-04-01 ENCOUNTER — Other Ambulatory Visit: Payer: Self-pay

## 2021-04-01 ENCOUNTER — Telehealth: Payer: Self-pay | Admitting: Medical Oncology

## 2021-04-01 DIAGNOSIS — R6510 Systemic inflammatory response syndrome (SIRS) of non-infectious origin without acute organ dysfunction: Secondary | ICD-10-CM

## 2021-04-01 DIAGNOSIS — R4701 Aphasia: Secondary | ICD-10-CM | POA: Diagnosis not present

## 2021-04-01 DIAGNOSIS — R1110 Vomiting, unspecified: Secondary | ICD-10-CM

## 2021-04-01 DIAGNOSIS — G40201 Localization-related (focal) (partial) symptomatic epilepsy and epileptic syndromes with complex partial seizures, not intractable, with status epilepticus: Secondary | ICD-10-CM | POA: Diagnosis present

## 2021-04-01 DIAGNOSIS — E440 Moderate protein-calorie malnutrition: Secondary | ICD-10-CM | POA: Diagnosis present

## 2021-04-01 DIAGNOSIS — G9341 Metabolic encephalopathy: Secondary | ICD-10-CM | POA: Diagnosis present

## 2021-04-01 DIAGNOSIS — Z781 Physical restraint status: Secondary | ICD-10-CM

## 2021-04-01 DIAGNOSIS — Z4659 Encounter for fitting and adjustment of other gastrointestinal appliance and device: Secondary | ICD-10-CM

## 2021-04-01 DIAGNOSIS — I959 Hypotension, unspecified: Secondary | ICD-10-CM | POA: Diagnosis not present

## 2021-04-01 DIAGNOSIS — C7931 Secondary malignant neoplasm of brain: Secondary | ICD-10-CM | POA: Diagnosis not present

## 2021-04-01 DIAGNOSIS — I3139 Other pericardial effusion (noninflammatory): Secondary | ICD-10-CM | POA: Diagnosis present

## 2021-04-01 DIAGNOSIS — R339 Retention of urine, unspecified: Secondary | ICD-10-CM | POA: Diagnosis not present

## 2021-04-01 DIAGNOSIS — C3411 Malignant neoplasm of upper lobe, right bronchus or lung: Secondary | ICD-10-CM | POA: Diagnosis not present

## 2021-04-01 DIAGNOSIS — R310 Gross hematuria: Secondary | ICD-10-CM | POA: Diagnosis not present

## 2021-04-01 DIAGNOSIS — J439 Emphysema, unspecified: Secondary | ICD-10-CM | POA: Diagnosis present

## 2021-04-01 DIAGNOSIS — Z85118 Personal history of other malignant neoplasm of bronchus and lung: Secondary | ICD-10-CM

## 2021-04-01 DIAGNOSIS — D849 Immunodeficiency, unspecified: Secondary | ICD-10-CM | POA: Diagnosis present

## 2021-04-01 DIAGNOSIS — G936 Cerebral edema: Secondary | ICD-10-CM | POA: Diagnosis present

## 2021-04-01 DIAGNOSIS — J690 Pneumonitis due to inhalation of food and vomit: Secondary | ICD-10-CM | POA: Diagnosis not present

## 2021-04-01 DIAGNOSIS — R4189 Other symptoms and signs involving cognitive functions and awareness: Secondary | ICD-10-CM | POA: Diagnosis not present

## 2021-04-01 DIAGNOSIS — E873 Alkalosis: Secondary | ICD-10-CM | POA: Diagnosis present

## 2021-04-01 DIAGNOSIS — J9601 Acute respiratory failure with hypoxia: Secondary | ICD-10-CM | POA: Diagnosis not present

## 2021-04-01 DIAGNOSIS — I10 Essential (primary) hypertension: Secondary | ICD-10-CM | POA: Diagnosis present

## 2021-04-01 DIAGNOSIS — R059 Cough, unspecified: Secondary | ICD-10-CM

## 2021-04-01 DIAGNOSIS — Z20822 Contact with and (suspected) exposure to covid-19: Secondary | ICD-10-CM | POA: Diagnosis present

## 2021-04-01 DIAGNOSIS — Z8249 Family history of ischemic heart disease and other diseases of the circulatory system: Secondary | ICD-10-CM

## 2021-04-01 DIAGNOSIS — Z79899 Other long term (current) drug therapy: Secondary | ICD-10-CM

## 2021-04-01 DIAGNOSIS — G40901 Epilepsy, unspecified, not intractable, with status epilepticus: Secondary | ICD-10-CM

## 2021-04-01 DIAGNOSIS — Z01818 Encounter for other preprocedural examination: Secondary | ICD-10-CM

## 2021-04-01 DIAGNOSIS — Z7951 Long term (current) use of inhaled steroids: Secondary | ICD-10-CM

## 2021-04-01 DIAGNOSIS — G9340 Encephalopathy, unspecified: Secondary | ICD-10-CM

## 2021-04-01 DIAGNOSIS — R569 Unspecified convulsions: Secondary | ICD-10-CM | POA: Diagnosis not present

## 2021-04-01 DIAGNOSIS — J449 Chronic obstructive pulmonary disease, unspecified: Secondary | ICD-10-CM

## 2021-04-01 DIAGNOSIS — R4182 Altered mental status, unspecified: Secondary | ICD-10-CM | POA: Diagnosis present

## 2021-04-01 DIAGNOSIS — Z87891 Personal history of nicotine dependence: Secondary | ICD-10-CM

## 2021-04-01 DIAGNOSIS — R092 Respiratory arrest: Secondary | ICD-10-CM

## 2021-04-01 LAB — COMPREHENSIVE METABOLIC PANEL
ALT: 12 U/L (ref 0–44)
AST: 27 U/L (ref 15–41)
Albumin: 3.4 g/dL — ABNORMAL LOW (ref 3.5–5.0)
Alkaline Phosphatase: 53 U/L (ref 38–126)
Anion gap: 13 (ref 5–15)
BUN: 5 mg/dL — ABNORMAL LOW (ref 8–23)
CO2: 19 mmol/L — ABNORMAL LOW (ref 22–32)
Calcium: 9.4 mg/dL (ref 8.9–10.3)
Chloride: 105 mmol/L (ref 98–111)
Creatinine, Ser: 0.76 mg/dL (ref 0.61–1.24)
GFR, Estimated: >60 mL/min (ref >60)
Glucose, Bld: 90 mg/dL (ref 70–99)
Potassium: 3.7 mmol/L (ref 3.5–5.1)
Sodium: 137 mmol/L (ref 135–145)
Total Bilirubin: 0.4 mg/dL (ref 0.3–1.2)
Total Protein: 6.7 g/dL (ref 6.5–8.1)

## 2021-04-01 LAB — I-STAT CHEM 8, ED
BUN: 3 mg/dL — ABNORMAL LOW (ref 8–23)
Calcium, Ion: 1 mmol/L — ABNORMAL LOW (ref 1.15–1.40)
Chloride: 105 mmol/L (ref 98–111)
Creatinine, Ser: 0.7 mg/dL (ref 0.61–1.24)
Glucose, Bld: 93 mg/dL (ref 70–99)
HCT: 40 % (ref 39.0–52.0)
Hemoglobin: 13.6 g/dL (ref 13.0–17.0)
Potassium: 3.6 mmol/L (ref 3.5–5.1)
Sodium: 137 mmol/L (ref 135–145)
TCO2: 21 mmol/L — ABNORMAL LOW (ref 22–32)

## 2021-04-01 LAB — RAPID URINE DRUG SCREEN, HOSP PERFORMED
Amphetamines: NOT DETECTED
Barbiturates: NOT DETECTED
Benzodiazepines: NOT DETECTED
Cocaine: NOT DETECTED
Opiates: NOT DETECTED
Tetrahydrocannabinol: NOT DETECTED

## 2021-04-01 LAB — I-STAT VENOUS BLOOD GAS, ED
Acid-Base Excess: 0 mmol/L (ref 0.0–2.0)
Bicarbonate: 21.8 mmol/L (ref 20.0–28.0)
Calcium, Ion: 1.05 mmol/L — ABNORMAL LOW (ref 1.15–1.40)
HCT: 40 % (ref 39.0–52.0)
Hemoglobin: 13.6 g/dL (ref 13.0–17.0)
O2 Saturation: 89 %
Potassium: 3.5 mmol/L (ref 3.5–5.1)
Sodium: 136 mmol/L (ref 135–145)
TCO2: 23 mmol/L (ref 22–32)
pCO2, Ven: 27.1 mmHg — ABNORMAL LOW (ref 44.0–60.0)
pH, Ven: 7.512 — ABNORMAL HIGH (ref 7.250–7.430)
pO2, Ven: 49 mmHg — ABNORMAL HIGH (ref 32.0–45.0)

## 2021-04-01 LAB — CBG MONITORING, ED: Glucose-Capillary: 87 mg/dL (ref 70–99)

## 2021-04-01 LAB — URINALYSIS, COMPLETE (UACMP) WITH MICROSCOPIC
Bilirubin Urine: NEGATIVE
Glucose, UA: NEGATIVE mg/dL
Ketones, ur: NEGATIVE mg/dL
Leukocytes,Ua: NEGATIVE
Nitrite: NEGATIVE
Protein, ur: NEGATIVE mg/dL
Specific Gravity, Urine: 1.026 (ref 1.005–1.030)
pH: 5 (ref 5.0–8.0)

## 2021-04-01 LAB — RESP PANEL BY RT-PCR (FLU A&B, COVID) ARPGX2
Influenza A by PCR: NEGATIVE
Influenza B by PCR: NEGATIVE
SARS Coronavirus 2 by RT PCR: NEGATIVE

## 2021-04-01 LAB — CBC WITH DIFFERENTIAL/PLATELET
Abs Immature Granulocytes: 0.03 10*3/uL (ref 0.00–0.07)
Basophils Absolute: 0 10*3/uL (ref 0.0–0.1)
Basophils Relative: 0 %
Eosinophils Absolute: 0 10*3/uL (ref 0.0–0.5)
Eosinophils Relative: 0 %
HCT: 37.8 % — ABNORMAL LOW (ref 39.0–52.0)
Hemoglobin: 11.9 g/dL — ABNORMAL LOW (ref 13.0–17.0)
Immature Granulocytes: 1 %
Lymphocytes Relative: 15 %
Lymphs Abs: 0.8 10*3/uL (ref 0.7–4.0)
MCH: 26.6 pg (ref 26.0–34.0)
MCHC: 31.5 g/dL (ref 30.0–36.0)
MCV: 84.6 fL (ref 80.0–100.0)
Monocytes Absolute: 0.9 10*3/uL (ref 0.1–1.0)
Monocytes Relative: 17 %
Neutro Abs: 3.8 10*3/uL (ref 1.7–7.7)
Neutrophils Relative %: 67 %
Platelets: 238 10*3/uL (ref 150–400)
RBC: 4.47 MIL/uL (ref 4.22–5.81)
RDW: 16.4 % — ABNORMAL HIGH (ref 11.5–15.5)
WBC: 5.6 10*3/uL (ref 4.0–10.5)
nRBC: 0 % (ref 0.0–0.2)

## 2021-04-01 LAB — ETHANOL: Alcohol, Ethyl (B): <10 mg/dL (ref <10)

## 2021-04-01 LAB — PROTIME-INR
INR: 1 (ref 0.8–1.2)
Prothrombin Time: 13.5 seconds (ref 11.4–15.2)

## 2021-04-01 LAB — BRAIN NATRIURETIC PEPTIDE: B Natriuretic Peptide: 25.6 pg/mL (ref 0.0–100.0)

## 2021-04-01 LAB — T4, FREE: Free T4: 0.76 ng/dL (ref 0.61–1.12)

## 2021-04-01 LAB — APTT: aPTT: 34 seconds (ref 24–36)

## 2021-04-01 LAB — LACTIC ACID, PLASMA
Lactic Acid, Venous: 2.2 mmol/L (ref 0.5–1.9)
Lactic Acid, Venous: 1.9 mmol/L (ref 0.5–1.9)

## 2021-04-01 LAB — AMMONIA: Ammonia: 26 umol/L (ref 9–35)

## 2021-04-01 LAB — TSH: TSH: 9.126 u[IU]/mL — ABNORMAL HIGH (ref 0.350–4.500)

## 2021-04-01 LAB — MAGNESIUM: Magnesium: 1.5 mg/dL — ABNORMAL LOW (ref 1.7–2.4)

## 2021-04-01 MED ORDER — SODIUM CHLORIDE 0.9 % IV SOLN
750.0000 mg | Freq: Two times a day (BID) | INTRAVENOUS | Status: DC
  Administered 2021-04-02: 750 mg via INTRAVENOUS
  Filled 2021-04-01 (×2): qty 7.5

## 2021-04-01 MED ORDER — SODIUM CHLORIDE 0.9 % IV SOLN
2.0000 g | Freq: Three times a day (TID) | INTRAVENOUS | Status: DC
  Administered 2021-04-02 – 2021-04-03 (×4): 2 g via INTRAVENOUS
  Filled 2021-04-01 (×4): qty 2

## 2021-04-01 MED ORDER — LACTATED RINGERS IV BOLUS (SEPSIS)
1000.0000 mL | Freq: Once | INTRAVENOUS | Status: AC
  Administered 2021-04-01: 1000 mL via INTRAVENOUS

## 2021-04-01 MED ORDER — LORAZEPAM 2 MG/ML IJ SOLN
1.0000 mg | INTRAMUSCULAR | Status: DC | PRN
  Administered 2021-04-03 – 2021-04-05 (×6): 2 mg via INTRAVENOUS
  Filled 2021-04-01 (×6): qty 1

## 2021-04-01 MED ORDER — VANCOMYCIN HCL 1250 MG/250ML IV SOLN
1250.0000 mg | Freq: Once | INTRAVENOUS | Status: AC
  Administered 2021-04-01: 1250 mg via INTRAVENOUS
  Filled 2021-04-01: qty 250

## 2021-04-01 MED ORDER — IOHEXOL 350 MG/ML SOLN
75.0000 mL | Freq: Once | INTRAVENOUS | Status: AC | PRN
  Administered 2021-04-01: 75 mL via INTRAVENOUS

## 2021-04-01 MED ORDER — LACTATED RINGERS IV BOLUS (SEPSIS)
250.0000 mL | Freq: Once | INTRAVENOUS | Status: AC
  Administered 2021-04-01: 250 mL via INTRAVENOUS

## 2021-04-01 MED ORDER — LACTATED RINGERS IV SOLN: INTRAVENOUS | Status: AC

## 2021-04-01 MED ORDER — ONDANSETRON HCL 4 MG PO TABS: 4.0000 mg | ORAL_TABLET | Freq: Four times a day (QID) | ORAL | Status: DC | PRN

## 2021-04-01 MED ORDER — VANCOMYCIN HCL 1250 MG/250ML IV SOLN: 1250.0000 mg | INTRAVENOUS | Status: DC

## 2021-04-01 MED ORDER — LACTATED RINGERS IV BOLUS (SEPSIS)
500.0000 mL | Freq: Once | INTRAVENOUS | Status: AC
  Administered 2021-04-01: 500 mL via INTRAVENOUS

## 2021-04-01 MED ORDER — ONDANSETRON HCL 4 MG/2ML IJ SOLN
4.0000 mg | Freq: Four times a day (QID) | INTRAMUSCULAR | Status: DC | PRN
  Administered 2021-04-02: 4 mg via INTRAVENOUS
  Filled 2021-04-01: qty 2

## 2021-04-01 MED ORDER — LEVETIRACETAM IN NACL 1000 MG/100ML IV SOLN
1000.0000 mg | INTRAVENOUS | Status: AC
  Administered 2021-04-01 (×2): 1000 mg via INTRAVENOUS
  Filled 2021-04-01: qty 100

## 2021-04-01 MED ORDER — SODIUM CHLORIDE 0.9 % IV SOLN
2.0000 g | Freq: Once | INTRAVENOUS | Status: AC
  Administered 2021-04-01: 2 g via INTRAVENOUS
  Filled 2021-04-01: qty 2

## 2021-04-01 MED ORDER — METRONIDAZOLE 500 MG/100ML IV SOLN
500.0000 mg | Freq: Once | INTRAVENOUS | Status: AC
  Administered 2021-04-01: 500 mg via INTRAVENOUS
  Filled 2021-04-01: qty 100

## 2021-04-01 MED ORDER — ASPIRIN 325 MG PO TABS: 650.0000 mg | ORAL_TABLET | Freq: Once | ORAL | Status: DC

## 2021-04-01 MED ORDER — LORAZEPAM 2 MG/ML IJ SOLN
1.0000 mg | Freq: Once | INTRAMUSCULAR | Status: AC
  Administered 2021-04-01: 1 mg via INTRAVENOUS
  Filled 2021-04-01: qty 1

## 2021-04-01 MED ORDER — ACETAMINOPHEN 650 MG RE SUPP: 650.0000 mg | Freq: Four times a day (QID) | RECTAL | Status: DC | PRN

## 2021-04-01 MED ORDER — ACETAMINOPHEN 325 MG PO TABS
650.0000 mg | ORAL_TABLET | Freq: Four times a day (QID) | ORAL | Status: DC | PRN
  Administered 2021-04-06 – 2021-04-07 (×2): 650 mg via ORAL
  Filled 2021-04-01 (×3): qty 2

## 2021-04-01 MED ORDER — MAGNESIUM SULFATE 2 GM/50ML IV SOLN
2.0000 g | Freq: Once | INTRAVENOUS | Status: AC
  Administered 2021-04-02: 2 g via INTRAVENOUS
  Filled 2021-04-01: qty 50

## 2021-04-01 MED ORDER — LORAZEPAM 2 MG/ML IJ SOLN
1.0000 mg | INTRAMUSCULAR | Status: DC | PRN
  Filled 2021-04-01: qty 1

## 2021-04-01 MED ORDER — LACTATED RINGERS IV BOLUS: 500.0000 mL | Freq: Once | INTRAVENOUS | Status: DC

## 2021-04-01 MED ORDER — ENOXAPARIN SODIUM 40 MG/0.4ML IJ SOSY: 40.0000 mg | PREFILLED_SYRINGE | INTRAMUSCULAR | Status: DC

## 2021-04-01 MED ORDER — GADOBUTROL 1 MMOL/ML IV SOLN
5.5000 mL | Freq: Once | INTRAVENOUS | Status: AC | PRN
  Administered 2021-04-01: 5.5 mL via INTRAVENOUS

## 2021-04-01 NOTE — Consult Note (Addendum)
NEURO HOSPITALIST CONSULT NOTE   Requestig physician: Dr. Reather Converse  Reason for Consult: Acute onset of aphasia  History obtained from:  ED Physician and Chart     HPI:                                                                                                                                          [name] [name] is an 65 y.o. 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 LKN was 5 PM Sunday evening when brother spoke to him by telephone. Noted to be confused by an aide who visited his home at 8 AM today. The patient was later found outside his home confused and aphasic and was brought to the ED, where a Code Stroke was activated.   He does not have a history of seizures listed in Epic, but is on Keppra 500 mg BID at home.   He exhibited two spells of transient right lower extremity jerking while in CT which lasted for about 10 seconds, without any apparent change in his level of consciousness and no change to his aphasia.  Past Medical History:  Diagnosis Date   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[place]) 12/25/2019   Cancer ([place])    COPD (chronic obstructive pulmonary disease) ([place]) 11/02/2007   Qualifier: Diagnosis of  By: Melvyn Novas MD, Christena Deem    Hypertension 12/24/2019   Iron deficiency anemia 08/23/2007   Qualifier: Diagnosis of  By: Jim Like      Past Surgical History:  Procedure Laterality Date   APPLICATION OF CRANIAL NAVIGATION N/A 08/30/2020   Procedure: APPLICATION OF CRANIAL NAVIGATION;  Surgeon: Consuella Lose, MD;  Location: [place] [facility];  Service: Neurosurgery;  Laterality: N/A;   CRANIOTOMY Left 08/30/2020   Procedure: Stereotactic left frontoparietal craniectomy for resection of tumor with brainlab;  Surgeon: Consuella Lose, MD;  Location: [place];  Service: Neurosurgery;  Laterality: Left;   ENDOBRONCHIAL ULTRASOUND N/A 12/27/2019   Procedure: ENDOBRONCHIAL ULTRASOUND;  Surgeon: Laurin Coder, MD;   Location: [facility];  Service: Endoscopy;  Laterality: N/A;   FINE NEEDLE ASPIRATION  12/27/2019   Procedure: FINE NEEDLE ASPIRATION;  Surgeon: Laurin Coder, MD;  Location: [facility];  Service: Endoscopy;;   IR IMAGING GUIDED PORT INSERTION  11/30/2020   VIDEO BRONCHOSCOPY N/A 12/27/2019   Procedure: VIDEO BRONCHOSCOPY WITHOUT FLUORO;  Surgeon: Laurin Coder, MD;  Location: [facility];  Service: Endoscopy;  Laterality: N/A;    Family History  Problem Relation Age of Onset   Hypertension Other               Social History:  reports that he has quit smoking. His smoking use included cigarettes. He smoked an average of .5 packs per day. He has never  used smokeless tobacco. He reports that he does not currently use alcohol. He reports that he does not use drugs.  Allergies  Allergen Reactions   Other Itching and Other (See Comments)    Seasonal allergies- Itchy eyes, runny nose, sneezing, scratchy throat    HOME MEDICATIONS:                                                                                                                     No current facility-administered medications on file prior to encounter.   Current Outpatient Medications on File Prior to Encounter  Medication Sig Dispense Refill   acetaminophen (TYLENOL) 500 MG tablet Take 500 mg by mouth every 6 (six) hours as needed for mild pain or moderate pain.     budesonide-formoterol (SYMBICORT) 80-4.5 MCG/ACT inhaler Inhale 2 puffs into the lungs 2 (two) times daily. (Patient taking differently: Inhale 2 puffs into the lungs 2 (two) times daily as needed (for flares of wheezing and/or shortness of breath).) 1 Inhaler 11   feeding supplement (ENSURE ENLIVE / ENSURE PLUS) LIQD Take 237 mLs by mouth 2 (two) times daily between meals. 732 mL 12   folic acid (FOLVITE) 1 MG tablet Take 1 tablet (1 mg total) by mouth daily. 30 tablet 4   levETIRAcetam (KEPPRA) 500 MG tablet Take 1 tablet (500 mg total) by mouth 2  (two) times daily. 60 tablet 2   lidocaine-prilocaine (EMLA) cream Apply 1 application topically as needed. 30 g 1   Multiple Vitamin (MULTIVITAMIN) tablet Take 1 tablet by mouth daily.     pantoprazole (PROTONIX) 40 MG tablet Take 1 tablet (40 mg total) by mouth daily with supper. 30 tablet 2   prochlorperazine (COMPAZINE) 10 MG tablet Take 1 tablet (10 mg total) by mouth every 6 (six) hours as needed for nausea or vomiting. 30 tablet 0   senna (SENOKOT) 8.6 MG TABS tablet Take 1 tablet (8.6 mg total) by mouth 2 (two) times daily. 120 tablet 0   tiotropium (SPIRIVA HANDIHALER) 18 MCG inhalation capsule Place 1 capsule into inhaler and inhale daily. 30 capsule 1   VENTOLIN HFA 108 (90 Base) MCG/ACT inhaler Inhale 2 puffs into the lungs every 4 (four) hours as needed for wheezing or shortness of breath.        ROS:  Unable to obtain due to aphasia.    Blood pressure (!) 133/117, pulse (!) 134, temperature 100.3 F (37.9 C), temperature source Rectal, resp. rate (!) 29, SpO2 98 %.   General Examination:                                                                                                       Physical Exam  HEENT-  [place]/AT    Lungs- Respirations unlabored Extremities- Warm and well perfused  Neurological Examination Mental Status: Awake. Will attempt to answer questions with garbled, nonsensical replies consisting of some words and word-like fragments. Follows no commands. Attempts to cooperate. Will make eye contact.  Cranial Nerves: II: Pupils are equal. After multiple trials, the patient will gaze towards and/or blink to visual stimuli presented to his right and left visual fields.  III,IV, VI: No ptosis. Will track to the left and right. Eye movements have a saccadic quality but there is no nystagmus.  V: Reacts to touch bilaterally VII:  Patient shows his teeth when attempting to speak, with no definite facial droop noted.  VIII: Hearing intact to voice IX,X: No hypophonia XI: Head is midline XII: Does not protrude tongue to command Motor: No definite asymmetry of upper or lower extremity strength in the context of patient being unable to follow commands.  Will elevate BUE when this is pantomimed for him, without definite drift. Will weakly resist examiner when attempting to move patient's arms passively.  Will resist with 5/5 strength when examiner attempts to bend at knees. Each lower extremity will remain elevated > 5 seconds when passively elevated by examiner.  Sensory: Grimaces to irritative stimuli bilateral feet. Will move upper extremities to tactile stimuli.  Deep Tendon Reflexes: 1+ bilateral brachioradialis. Unable to elicit patellar reflexes due to patient being unable to relax legs.  Plantars: Mute bilaterally  Cerebellar/Gait: Unable to assess.    Lab Results: Basic Metabolic Panel: No results for input(s): NA, K, CL, CO2, GLUCOSE, BUN, CREATININE, CALCIUM, MG, PHOS in the last 168 hours.  CBC: No results for input(s): WBC, NEUTROABS, HGB, HCT, MCV, PLT in the last 168 hours.  Cardiac Enzymes: No results for input(s): CKTOTAL, CKMB, CKMBINDEX, TROPONINI in the last 168 hours.  Lipid Panel: No results for input(s): CHOL, TRIG, HDL, CHOLHDL, VLDL, LDLCALC in the last 168 hours.  Imaging: No results found.   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 LKN was sometime Sunday evening when brother spoke to him by telephone. Noted to be confused by an aide who visited his home at 8 AM today. The patient was later found outside his home confused and aphasic and was brought to the ED, where a Code Stroke was activated.  1. Exam reveals receptive aphasia with nonsensical speech and inability to follow commands. Two episodes of right lower extremity  spasmodic jerking activity lasting about 10 seconds each were witnessed in CT, which no associated change in patient's mental status. Findings best localize as a left cerebral hemisphere lesion.  2. CT head  10/26 w/wo: There is a new 1.1 cm enhancing lesion of the parasagittal left parietal lobe with mild adjacent edema. Surgical cavity of the high left parietal lobe has contracted. There is thin enhancement at the margins as well as adjacent dural enhancement. No nodular enhancement to suggest recurrent disease. Remaining but decreased adjacent hypoattenuation likely reflects gliosis.  3. STAT CT head: Area of hypodensity in the left parieto-occipital lobe; DDx ischemic infarction versus edema from recent stereotactic radiosurgery.  4. CTA of head and neck: No LVO per communication with Radiology.  5. CTP: No perfusion deficit on wet read.  6. Most likely etiology for the patient's presentation is partial complex seizure. Stroke is also possible but felt to be less likely. Not a TNK candidate due to time criteria.   Recommendations: 1. STAT EEG (ordered) 2. Keppra supplementatl load 2000 mg IV x 1 STAT. Increase scheduled Keppra to 750 mg BID.  3. Frequent neuro checks.  4. 650 mg ASA x 1 crushed.  5. MRI brain with and without contrast.  6. Ativan 2 mg IV PRN seizure activity and call Neurology.  7. Seizure precautions.   Addendum: - MRI brain: 1. Technically limited exam due to extensive motion artifact. 2. 1.4 cm metastasis involving the parasagittal left parietal lobe. Surrounding vasogenic edema without midline shift. 3. Diffuse dural thickening and enhancement about the adjacent craniotomy site, favored to be postoperative in nature, although attention at follow-up is recommended. 4. Question subtly increased diffusion signal abnormality at the left thalamus. Although this finding is not entirely certain given motion artifact on this exam, possible subtle changes of ischemia  or possibly seizure could be considered. Additional mildly increased diffusion signal within the adjacent left parieto-occipital region could also reflect changes of acute seizure. Correlation with EEG recommended. 5. No other acute intracranial abnormality.  Addendum: - Patient vomited and aspirated, requiring intubation - Dexamethasone has been started for the peritumoral edema seen on MRI  Addendum: - Abdominal twitching noted by CCM - Patient re-examined while on propofol at a rate of 10. No abdominal twitching or other clinical seizure activity noted.  - Ceribell placed. Frontal spikes seen intermittently without evolution. Poor lead placement posteriorly due to technical factor of thick curly hair.  - Propofol rate increased to 20   Electronically signed: Dr. Kerney Elbe 04/01/2021, 7:33 PM

## 2021-04-01 NOTE — H&P (Signed)
History and Physical    [name] [name] VPX:[id number] DOB: [birth year] DOA: 04/01/2021  PCP: [name], NP  Patient coming from: Home  I have personally briefly reviewed patient's old medical records in [place]  Chief Complaint: AMS  HPI: [name] [name] is a 65 y.o. 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  Pt recently underwent Stereotactic radiosurgery to brain x6 days ago.  Pt LKN 5pm Sunday when brother spoke to him by phone.  Pt confused according to aide who visited home today at [place].  This afternoon, pt noted by neighbor to be sitting outside for unknown period of time.  EMS called.  Pt brought in to ED with tremors and aphasia.   ED Course: Seen as code stroke on arrival, felt more likely to have complex partial seizures per neurology with 2 episodes of RLE spasmodic jerking activity 10 sec each observed, gaze preference to the R initially.  Also noted to have tachypnea, tachycardia, and Tm 100.3  Pt given 2g keppra load followed by 1mg  ativan.  Cefepime, flagyl, vanc and 30cc/kg bolus for possible sepsis.  CT head shows new hypodensity in L parietal and occipital lobes: acute infarct vs edema due to recent SRS.  CTA head = 1) no large vessel occlusion 2) increased perfusion, possibly seizure activity in L parietal lobe  CXR = air under R diaphram vs bowel gas  MRI brain pending   Review of Systems: Unable to perform due to AMS Past Medical History:  Diagnosis Date   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[place]) 12/25/2019   Cancer ([place])    COPD (chronic obstructive pulmonary disease) ([place]) 11/02/2007   Qualifier: Diagnosis of  By: Melvyn Novas MD, Christena Deem    Hypertension 12/24/2019   Iron deficiency anemia 08/23/2007   Qualifier: Diagnosis of  By: Jim Like      Past Surgical History:  Procedure Laterality Date   APPLICATION OF CRANIAL NAVIGATION N/A 08/30/2020   Procedure: APPLICATION OF CRANIAL  NAVIGATION;  Surgeon: Consuella Lose, MD;  Location: [place];  Service: Neurosurgery;  Laterality: N/A;   CRANIOTOMY Left 08/30/2020   Procedure: Stereotactic left frontoparietal craniectomy for resection of tumor with brainlab;  Surgeon: Consuella Lose, MD;  Location: [facility];  Service: Neurosurgery;  Laterality: Left;   ENDOBRONCHIAL ULTRASOUND N/A 12/27/2019   Procedure: ENDOBRONCHIAL ULTRASOUND;  Surgeon: Laurin Coder, MD;  Location: [facility];  Service: Endoscopy;  Laterality: N/A;   FINE NEEDLE ASPIRATION  12/27/2019   Procedure: FINE NEEDLE ASPIRATION;  Surgeon: Laurin Coder, MD;  Location: [facility];  Service: Endoscopy;;   IR IMAGING GUIDED PORT INSERTION  11/30/2020   VIDEO BRONCHOSCOPY N/A 12/27/2019   Procedure: VIDEO BRONCHOSCOPY WITHOUT FLUORO;  Surgeon: Laurin Coder, MD;  Location: [facility];  Service: Endoscopy;  Laterality: N/A;     reports that he has quit smoking. His smoking use included cigarettes. He smoked an average of .5 packs per day. He has never used smokeless tobacco. He reports that he does not currently use alcohol. He reports that he does not use drugs.  Allergies  Allergen Reactions   Other Itching and Other (See Comments)    Seasonal allergies- Itchy eyes, runny nose, sneezing, scratchy throat    Family History  Problem Relation Age of Onset   Hypertension Other      Prior to Admission medications   Medication Sig Start Date End Date Taking? Authorizing Provider  acetaminophen (TYLENOL) 500 MG tablet Take 500 mg by  mouth every 6 (six) hours as needed for mild pain or moderate pain.    [provider]  budesonide-formoterol (SYMBICORT) 80-4.5 MCG/ACT inhaler Inhale 2 puffs into the lungs 2 (two) times daily. Patient taking differently: Inhale 2 puffs into the lungs 2 (two) times daily as needed (for flares of wheezing and/or shortness of breath). 08/03/19   [name], NP  feeding supplement (ENSURE ENLIVE /  ENSURE PLUS) LIQD Take 237 mLs by mouth 2 (two) times daily between meals. 03/15/20   [name], NP  folic acid (FOLVITE) 1 MG tablet Take 1 tablet (1 mg total) by mouth daily. 09/27/20   Curt Bears, MD  levETIRAcetam (KEPPRA) 500 MG tablet Take 1 tablet (500 mg total) by mouth 2 (two) times daily. 09/20/20   Samella Parr, NP  lidocaine-prilocaine (EMLA) cream Apply 1 application topically as needed. 12/17/20   Curt Bears, MD  Multiple Vitamin (MULTIVITAMIN) tablet Take 1 tablet by mouth daily.    [provider]  pantoprazole (PROTONIX) 40 MG tablet Take 1 tablet (40 mg total) by mouth daily with supper. 01/01/21   [place]ott Rakes, MD  prochlorperazine (COMPAZINE) 10 MG tablet Take 1 tablet (10 mg total) by mouth every 6 (six) hours as needed for nausea or vomiting. 09/28/20   Curt Bears, MD  senna (SENOKOT) 8.6 MG TABS tablet Take 1 tablet (8.6 mg total) by mouth 2 (two) times daily. 09/20/20   Samella Parr, NP  tiotropium (SPIRIVA HANDIHALER) 18 MCG inhalation capsule Place 1 capsule into inhaler and inhale daily. 12/28/19   Nicolette Bang, MD  VENTOLIN HFA 108 [phone] Base) MCG/ACT inhaler Inhale 2 puffs into the lungs every 4 (four) hours as needed for wheezing or shortness of breath. 12/28/19   [provider]    Physical Exam: Vitals:   04/01/21 1915 04/01/21 2015 04/01/21 2045 04/01/21 2115  BP:    (!) 135/95  Pulse: (!) 124 (!) 119 (!) 127 (!) 123  Resp: 20 (!) 27 19 (!) 36  Temp:      TempSrc:      SpO2: 96% 91% 99% 91%    Constitutional: Altered, trying to sit up in bed Eyes: PERRL, lids and conjunctivae normal ENMT: Mucous membranes are moist. Posterior pharynx clear of any exudate or lesions.Normal dentition.  Neck: normal, supple, no masses, no thyromegaly Respiratory: Tachypnic Cardiovascular: Tachycardic  Abdomen: Doesn't seem to have TTP, no guarding, no apparent rebound Musculoskeletal: no clubbing / cyanosis. No joint  deformity upper and lower extremities. Good ROM, no contractures. Normal muscle tone.  Skin: no rashes, lesions, ulcers. No induration Neurologic: MAE, purposful movements with all 4 extremities.  Has global aphasia.  Not seeing much tremor right now. Psychiatric: Confused, MAE.   Labs on Admission: I have personally reviewed following labs and imaging studies  CBC: Recent Labs  Lab 04/01/21 1859 04/01/21 1936  WBC 5.6  --   NEUTROABS 3.8  --   HGB 11.9* 13.6  13.6  HCT 37.8* 40.0  40.0  MCV 84.6  --   PLT 238  --    Basic Metabolic Panel: Recent Labs  Lab 04/01/21 1859 04/01/21 1936  NA 137 137  136  K 3.7 3.6  3.5  CL 105 105  CO2 19*  --   GLUCOSE 90 93  BUN 5* 3*  CREATININE 0.76 0.70  CALCIUM 9.4  --   MG 1.5*  --    GFR: Estimated Creatinine Clearance: 69 mL/min (by C-G  formula based on SCr of 0.7 mg/dL). Liver Function Tests: Recent Labs  Lab 04/01/21 1859  AST 27  ALT 12  ALKPHOS 53  BILITOT 0.4  PROT 6.7  ALBUMIN 3.4*   No results for input(s): LIPASE, AMYLASE in the last 168 hours. Recent Labs  Lab 04/01/21 1859  AMMONIA 26   Coagulation Profile: Recent Labs  Lab 04/01/21 2000  INR 1.0   Cardiac Enzymes: No results for input(s): CKTOTAL, CKMB, CKMBINDEX, TROPONINI in the last 168 hours. BNP (last 3 results) No results for input(s): PROBNP in the last 8760 hours. HbA1C: No results for input(s): HGBA1C in the last 72 hours. CBG: Recent Labs  Lab 04/01/21 1820  GLUCAP 87   Lipid Profile: No results for input(s): CHOL, HDL, LDLCALC, TRIG, CHOLHDL, LDLDIRECT in the last 72 hours. Thyroid Function Tests: Recent Labs    04/01/21 1906  TSH 9.126*  FREET4 0.76   Anemia Panel: No results for input(s): VITAMINB12, FOLATE, FERRITIN, TIBC, IRON, RETICCTPCT in the last 72 hours. Urine analysis:    Component Value Date/Time   COLORURINE STRAW (A) 04/01/2021 1900   APPEARANCEUR CLEAR 04/01/2021 1900   LABSPEC 1.026 04/01/2021 1900    PHURINE 5.0 04/01/2021 1900   GLUCOSEU NEGATIVE 04/01/2021 1900   HGBUR SMALL (A) 04/01/2021 1900   BILIRUBINUR NEGATIVE 04/01/2021 1900   KETONESUR NEGATIVE 04/01/2021 1900   PROTEINUR NEGATIVE 04/01/2021 1900   UROBILINOGEN 1.0 05/28/2008 1859   NITRITE NEGATIVE 04/01/2021 1900   LEUKOCYTESUR NEGATIVE 04/01/2021 1900    Radiological Exams on Admission: DG Chest Port 1 View  Result Date: 04/01/2021 CLINICAL DATA:  Questionable sepsis. EXAM: PORTABLE CHEST 1 VIEW COMPARISON:  Chest x-ray 08/30/2020.  CT of the chest 02/08/2021. FINDINGS: Right chest port catheter tip projects over the distal SVC. Chronic appearing scarring in the right apex and paramediastinal region appears unchanged. Bullae are again noted in the left lung apex. Nodular densities in the left lower lung persists, grossly unchanged. Artifact overlies is region. There is no new lung infiltrate, pleural effusion or pneumothorax identified. Cardiac silhouette is within normal limits. No acute fractures are seen. Air under the right hemidiaphragm is indeterminate. IMPRESSION: 1. Air under the right hemidiaphragm is indeterminate and may be within air distended bowel. If there is high clinical concern for free air recommend dedicated abdominal series with decubitus view. 2. Stable nodular densities in the left lower lung. No new lung infiltrate. Electronically Signed   By: Ronney Asters M.D.   On: 04/01/2021 21:28   CT HEAD CODE STROKE WO CONTRAST  Result Date: 04/01/2021 CLINICAL DATA:  Code stroke. EXAM: CT HEAD WITHOUT CONTRAST TECHNIQUE: Contiguous axial images were obtained from the base of the skull through the vertex without intravenous contrast. COMPARISON:  03/13/2021. FINDINGS: Brain: Large area of hypodensity involving the left parietal and occipital lobe; this area previously contained an enhancing lesion and was the subject of recent SRS. No acute hemorrhage, mass effect, or midline shift. No hydrocephalus or  extra-axial collection. Vascular: No hyperdense vessel or unexpected calcification. Skull: Status post right frontotemporal and left parietal craniotomies. No acute osseous abnormality. Sinuses/Orbits: Mucosal thickening in the right maxillary sinus. The orbits are unremarkable. Other: Fluid in the right mastoid air cells. ASPECTS [facility] Stroke Program Early CT Score) - Ganglionic level infarction (caudate, lentiform nuclei, internal capsule, insula, M1-M3 cortex): 7 - Supraganglionic infarction (M4-M6 cortex): 3 Total score (0-10 with 10 being normal): 10 IMPRESSION: 1. Hypodensity in the left parietal and occipital lobes, which is new  from the prior exam but which correlates with an area of recent radiation therapy. This may represent acute infarct or edema related to recent SRS. 2. ASPECTS is 10 Code stroke imaging results were communicated on 04/01/2021 at 7:46 pm to provider Dr. Cheral Marker via secure text paging. Electronically Signed   By: Merilyn Baba M.D.   On: 04/01/2021 19:48   CT ANGIO HEAD NECK W WO CM W PERF (CODE STROKE)  Result Date: 04/01/2021 CLINICAL DATA:  Initial evaluation for neuro deficit, stroke suspected, aphasia. EXAM: CT ANGIOGRAPHY HEAD AND NECK CT PERFUSION BRAIN TECHNIQUE: Multidetector CT imaging of the head and neck was performed using the standard protocol during bolus administration of intravenous contrast. Multiplanar CT image reconstructions and MIPs were obtained to evaluate the vascular anatomy. Carotid stenosis measurements (when applicable) are obtained utilizing NASCET criteria, using the distal internal carotid diameter as the denominator. Multiphase CT imaging of the brain was performed following IV bolus contrast injection. Subsequent parametric perfusion maps were calculated using RAPID software. CONTRAST:  28mL OMNIPAQUE IOHEXOL 350 MG/ML SOLN COMPARISON:  Prior CT from earlier the same day. FINDINGS: CTA NECK FINDINGS Aortic arch: Visualized aortic arch normal  caliber with normal 3 vessel morphology. No stenosis about the origin of the great vessels. Right carotid system: Right common and internal carotid arteries widely patent without stenosis, dissection or occlusion. Left carotid system: Left common and internal carotid arteries widely patent without stenosis, dissection or occlusion. Vertebral arteries: Both vertebral arteries arise from the subclavian arteries. No proximal subclavian artery stenosis. Both vertebral arteries widely patent without stenosis, dissection or occlusion. Skeleton: No discrete or worrisome osseous lesions. C3 and C4 vertebral bodies are fused. Other neck: Asymmetric thickening and enhancement seen about the right masseter muscle (series 5, image 88), nonspecific, but could reflect post radiation changes and/or myositis, which could be either infectious or inflammatory in nature. Adjacent right parotid gland and submandibular glands within normal limits. No other acute abnormality within the neck. Upper chest: Abnormal pleuroparenchymal thickening at the medial right upper lobe/right lung apex, likely reflecting post treatment changes. Severe bolus emphysematous changes within the visualized left lung. 12 mm left upper lobe nodule partially visualized (series 5, image 169). Right-sided Port-A-Cath noted. Review of the MIP images confirms the above findings CTA HEAD FINDINGS Anterior circulation: Both internal carotid arteries widely patent to the termini without stenosis. 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 No residual aneurysm visualized. A1 segments patent bilaterally. Left A1 hypoplastic. Normal anterior communicating artery complex. Anterior cerebral arteries patent to their distal aspects without stenosis. No M1 stenosis or occlusion. Normal MCA bifurcations. Distal MCA branches perfused and symmetric. Posterior circulation: Both V4 segments patent to the vertebrobasilar junction without  stenosis. Right PICA patent. Left PICA not well seen. Basilar patent to its distal aspect without stenosis. Superior cerebral arteries patent bilaterally. Both PCAs primarily supplied via the basilar well perfused or distal aspects. Venous sinuses: Grossly patent a large for timing the contrast bolus. Anatomic variants: None significant. Asymmetric left a meningeal enhancement at the left parietal region related to vasogenic edema and known metastasis at this location. Review of the MIP images confirms the above findings CT Brain Perfusion Findings: ASPECTS: 10. CBF (<30%) Volume: 55mL Perfusion (Tmax>6.0s) volume: 50mL Mismatch Volume: 47mL Infarction Location:Negative CT perfusion with no evidence for acute ischemia. Increased perfusion is seen within the left parietal region on dedicated perfusion maps, presumably related to vasogenic edema and known metastatic lesion at this location.  Changes of acute seizure may be contributory. IMPRESSION: 1. Negative CTA for emergent large vessel occlusion. 2. Negative CT perfusion with no evidence for acute ischemia. 3. Mild for age atheromatous disease. No hemodynamically significant or correctable stenosis about the major arterial vasculature of the head and neck. 4. Increased perfusion with leptomeningeal enhancement about the left parietal region, presumably related to vasogenic edema and known metastasis at this location. Superimposed changes of acute seizure could also be considered. 5. 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 No residual aneurysm visualized. 6. Presumed post treatment changes at the right lung apex, with persistent 12 mm left upper lobe nodule. 7. Asymmetric thickening and enhancement about the right masseter muscle, nonspecific, but suspected to reflect post radiation changes. Acute myositis, which could be either infectious or inflammatory, would be the primary differential consideration. 8. Emphysema  (ICD10-J43.9). Results discussed by telephone at the time of interpretation on 04/01/2021 at approximately 8:15 p.m. to provider ERIC [facility] , who verbally acknowledged these results. Electronically Signed   By: Jeannine Boga M.D.   On: 04/01/2021 21:07    EKG: Independently reviewed.  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)   Brain metastasis (HCC)   SIRS (systemic inflammatory response syndrome) (HCC)    AMS - likely due to complex partial seizures Per neuro: Keppra 2g load then 750mg  BID MRI brain EEG Ativan 2mg  PRN seizure activity Tele monitor Seizure precautions Will put in ativan 1-2mg  Q4H PRN for sedation / seizures since pt currently trying to climb out of bed and confused NSCLC with brain mets - S/p SRS x6 days ago Increased edema in treatment area on imaging CT suggesting seizure focus MRI pending SIRS - DDx = seizure activity, abd infection, other Getting CT AP w/o contrast to r/o pneumoperitoneum as there was some question of this on CXR Though exam doesn't seem c/w peritonitis Got empiric cefepime, flagyl, vanc in ED Got 30cc/kg IVF bolus IVF currently at 150 cc/hr Tele monitor Serial lactates BCx pending TSH elevated - Normal T4 TSH normal as of last month Suspicious that todays elevation is secondary to seizure activity.  DVT prophylaxis: Lovenox Code Status: Full Family Communication: Spoke with POA [name] on phone Disposition Plan: TBD Consults called: Neurology Admission status: Place in 71    Alasdair Kleve, [place] Hospitalists  How to contact the [facility] Attending or Consulting provider [place] or covering provider during after hours [place], for this patient?  Check the care team in [facility] and look for a) attending/consulting [facility] provider listed and b) the [facility] team listed Log into [email or web address]  Amion Physician Scheduling and messaging for  groups and whole hospitals  On call and physician scheduling software for group practices, residents, hospitalists and other medical providers for call, clinic, rotation and shift schedules. On[employer] is a hospital-wide system for scheduling doctors and paging doctors on call. EasyPlot is for scientific plotting and data analysis.  [email or web address]  and use [place]'s universal password to access. If you do not have the password, please contact the hospital operator.  Locate the [facility] provider you are looking for under [facility] and page to a number that you can be directly reached. If you still have difficulty reaching the provider, please page the [facility] (Director on Call) for the Hospitalists listed on amion for assistance.  04/01/2021, 11:27 PM

## 2021-04-01 NOTE — Telephone Encounter (Signed)
Appts confirmed for this week.

## 2021-04-01 NOTE — Code Documentation (Signed)
Responded to Code Stroke called at [facility] for aphasia and visual changes. Pt was already in ED at the time Code Stroke was initiated. On assessment by EDP, aphasia/visual changes were noticed so Code Stroke was called. [id number], CBG-87, CT head-no acute changes, CTA/CTP-no LVO. While in CT, pt had a few seconds of a jerking/spastic type movement. Pt mental status did not change with this movement. 2g keppra IV given to pt in CT. Plan for EEG/MRI.

## 2021-04-01 NOTE — ED Provider Notes (Signed)
[facility] EMERGENCY DEPARTMENT Provider Note   CSN: [id number] Arrival date & time: 04/01/21  1811  An emergency department physician performed an initial assessment on this suspected stroke patient at 71.  History No chief complaint on file.   [name] [name] is a 65 y.o. male.  HPI  65 year old male with PMH significant for COPD, metastatic non-small cell lung cancer treated with immunotherapy/chemotherapy, craniotomy, HTN, IDA, and others as below who presents to the ED via EMS with complaint of AMS.  Per EMS report, the patient was found outside by a neighbor acting abnormally.  Patient was reportedly seen by his home health aide this morning who reports that he seemed confused but was not aphasic.  Discussed case with patient's brother via phone, who stated that he last spoke with the patient at 5:00 PM last night, and he was behaving normally.  On arrival, the patient is confused and unable to answer further questions.  Per discussion with patient's brother, he has not had any recent known illnesses or falls, or injuries.   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   Cancer ([place])    COPD (chronic obstructive pulmonary disease) ([place]) 11/02/2007   Qualifier: Diagnosis of  By: Melvyn Novas MD, Christena Deem    Hypertension 12/24/2019   Iron deficiency anemia 08/23/2007   Qualifier: Diagnosis of  By: Jim Like      Patient Active Problem List   Diagnosis Date Noted   Seizures ([place]) 04/01/2021   SIRS (systemic inflammatory response syndrome) ([place]) 04/01/2021   Acute encephalopathy 04/01/2021   Neutropenia ([place]) 12/17/2020   Brain mass 08/24/2020   Brain metastasis ([place]) 08/23/2020   Encounter for antineoplastic immunotherapy 04/02/2020   Chemotherapy induced neutropenia ([place]) 02/28/2020   Malignant neoplasm of bronchus of right upper lobe ([place]) 01/03/2020   Encounter for  antineoplastic chemotherapy 01/03/2020   Goals of care, counseling/discussion 01/03/2020   Malnutrition of moderate degree 12/26/2019   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   Hypertension 12/24/2019   Hyponatremia 12/24/2019   Tobacco use 11/02/2007   COPD (chronic obstructive pulmonary disease) ([place]) 11/02/2007   PULMONARY INFILTRATE INCLUDES (EOSINOPHILIA) 08/23/2007    Past Surgical History:  Procedure Laterality Date   APPLICATION OF CRANIAL NAVIGATION N/A 08/30/2020   Procedure: APPLICATION OF CRANIAL NAVIGATION;  Surgeon: Consuella Lose, MD;  Location: [facility];  Service: Neurosurgery;  Laterality: N/A;   CRANIOTOMY Left 08/30/2020   Procedure: Stereotactic left frontoparietal craniectomy for resection of tumor with brainlab;  Surgeon: Consuella Lose, MD;  Location: [facility];  Service: Neurosurgery;  Laterality: Left;   ENDOBRONCHIAL ULTRASOUND N/A 12/27/2019   Procedure: ENDOBRONCHIAL ULTRASOUND;  Surgeon: Laurin Coder, MD;  Location: [facility];  Service: Endoscopy;  Laterality: N/A;   FINE NEEDLE ASPIRATION  12/27/2019   Procedure: FINE NEEDLE ASPIRATION;  Surgeon: Laurin Coder, MD;  Location: [facility];  Service: Endoscopy;;   IR IMAGING GUIDED PORT INSERTION  11/30/2020   VIDEO BRONCHOSCOPY N/A 12/27/2019   Procedure: VIDEO BRONCHOSCOPY WITHOUT FLUORO;  Surgeon: Laurin Coder, MD;  Location: [facility];  Service: Endoscopy;  Laterality: N/A;       Family History  Problem Relation Age of Onset   Hypertension Other     Social History   Tobacco Use   Smoking status: Former    Packs/day: 0.50    Types: Cigarettes   Smokeless tobacco: Never   Tobacco comments:  Pt states he stopped since he was discharged from the hospital  Vaping Use   Vaping Use: Never used  Substance Use Topics   Alcohol use: Not Currently   Drug use: Never    Home Medications Prior to Admission medications   Medication Sig Start Date End Date  Taking? Authorizing Provider  acetaminophen (TYLENOL) 500 MG tablet Take 500 mg by mouth every 6 (six) hours as needed for mild pain or moderate pain.    [provider]  budesonide-formoterol (SYMBICORT) 80-4.5 MCG/ACT inhaler Inhale 2 puffs into the lungs 2 (two) times daily. Patient taking differently: Inhale 2 puffs into the lungs 2 (two) times daily as needed (for flares of wheezing and/or shortness of breath). 08/03/19   [name], NP  feeding supplement (ENSURE ENLIVE / ENSURE PLUS) LIQD Take 237 mLs by mouth 2 (two) times daily between meals. 03/15/20   [name], NP  folic acid (FOLVITE) 1 MG tablet Take 1 tablet (1 mg total) by mouth daily. 09/27/20   Curt Bears, MD  levETIRAcetam (KEPPRA) 500 MG tablet Take 1 tablet (500 mg total) by mouth 2 (two) times daily. 09/20/20   Samella Parr, NP  lidocaine-prilocaine (EMLA) cream Apply 1 application topically as needed. 12/17/20   Curt Bears, MD  Multiple Vitamin (MULTIVITAMIN) tablet Take 1 tablet by mouth daily.    [provider]  pantoprazole (PROTONIX) 40 MG tablet Take 1 tablet (40 mg total) by mouth daily with supper. 01/01/21   Charlott Rakes, MD  prochlorperazine (COMPAZINE) 10 MG tablet Take 1 tablet (10 mg total) by mouth every 6 (six) hours as needed for nausea or vomiting. 09/28/20   Curt Bears, MD  senna (SENOKOT) 8.6 MG TABS tablet Take 1 tablet (8.6 mg total) by mouth 2 (two) times daily. 09/20/20   Samella Parr, NP  tiotropium (SPIRIVA HANDIHALER) 18 MCG inhalation capsule Place 1 capsule into inhaler and inhale daily. 12/28/19   Nicolette Bang, MD  VENTOLIN HFA 108 [id number] Base) MCG/ACT inhaler Inhale 2 puffs into the lungs every 4 (four) hours as needed for wheezing or shortness of breath. 12/28/19   [provider]    Allergies    Other  Review of Systems   Review of Systems  Unable to perform ROS: Mental status change   Physical Exam Updated Vital  Signs BP 122/86   Pulse (!) 116   Temp 100.3 F (37.9 C) (Rectal)   Resp 19   SpO2 97%   Physical Exam Vitals and nursing note reviewed.  Constitutional:      General: He is not in acute distress.    Appearance: Normal appearance. He is well-developed and normal weight. He is not ill-appearing, toxic-appearing or diaphoretic.  HENT:     Head: Normocephalic and atraumatic.     Right Ear: External ear normal.     Left Ear: External ear normal.     Nose: Nose normal.     Mouth/Throat:     Mouth: Mucous membranes are dry.     Pharynx: Oropharynx is clear.  Eyes:     General: Vision grossly intact. No scleral icterus.    Extraocular Movements: Extraocular movements intact.     Conjunctiva/sclera: Conjunctivae normal.     Pupils: Pupils are equal, round, and reactive to light.     Comments: Pupils 3 mm, right-sided gaze preference  Cardiovascular:     Rate and Rhythm: Regular rhythm. Tachycardia present.     Pulses: Normal pulses.  Heart sounds: Normal heart sounds. No murmur heard. Pulmonary:     Effort: Pulmonary effort is normal. No respiratory distress.     Breath sounds: Normal breath sounds.  Abdominal:     Palpations: Abdomen is soft.     Tenderness: There is no abdominal tenderness.  Musculoskeletal:        General: Normal range of motion.     Cervical back: Normal range of motion and neck supple. No rigidity or tenderness.     Right lower leg: No edema.     Left lower leg: No edema.  Lymphadenopathy:     Cervical: No cervical adenopathy.  Skin:    General: Skin is warm and dry.     Capillary Refill: Capillary refill takes less than 2 seconds.  Neurological:     Mental Status: He is alert. He is confused.     GCS: GCS eye subscore is 4. GCS verbal subscore is 3. GCS motor subscore is 6.     Cranial Nerves: Dysarthria present. No cranial nerve deficit or facial asymmetry.     Sensory: Sensation is intact.     Motor: Tremor present. No atrophy, abnormal muscle  tone or seizure activity.     Comments: Tremulous, intermittently following commands. Global aphasia, unable to answer most questions appropriately. Appears confused and dysarthric. LLE 4/5 strength, RLE 5/5.  Right-sided gaze preference.  Psychiatric:        Behavior: Behavior is cooperative.    ED Results / Procedures / Treatments   Labs (all labs ordered are listed, but only abnormal results are displayed) Labs Reviewed  CBC WITH DIFFERENTIAL/PLATELET - Abnormal; Notable for the following components:      Result Value   Hemoglobin 11.9 (*)    HCT 37.8 (*)    RDW 16.4 (*)    All other components within normal limits  COMPREHENSIVE METABOLIC PANEL - Abnormal; Notable for the following components:   CO2 19 (*)    BUN 5 (*)    Albumin 3.4 (*)    All other components within normal limits  URINALYSIS, COMPLETE (UACMP) WITH MICROSCOPIC - Abnormal; Notable for the following components:   Color, Urine STRAW (*)    Hgb urine dipstick SMALL (*)    Bacteria, UA RARE (*)    All other components within normal limits  LACTIC ACID, PLASMA - Abnormal; Notable for the following components:   Lactic Acid, Venous 2.2 (*)    All other components within normal limits  TSH - Abnormal; Notable for the following components:   TSH 9.126 (*)    All other components within normal limits  MAGNESIUM - Abnormal; Notable for the following components:   Magnesium 1.5 (*)    All other components within normal limits  I-STAT VENOUS BLOOD GAS, ED - Abnormal; Notable for the following components:   pH, Ven 7.512 (*)    pCO2, Ven 27.1 (*)    pO2, Ven 49.0 (*)    Calcium, Ion 1.05 (*)    All other components within normal limits  I-STAT CHEM 8, ED - Abnormal; Notable for the following components:   BUN 3 (*)    Calcium, Ion 1.00 (*)    TCO2 21 (*)    All other components within normal limits  RESP PANEL BY RT-PCR (FLU A&B, COVID) ARPGX2  CULTURE, BLOOD (ROUTINE X 2)  CULTURE, BLOOD (ROUTINE X 2)   AMMONIA  LACTIC ACID, PLASMA  BRAIN NATRIURETIC PEPTIDE  ETHANOL  RAPID URINE DRUG SCREEN, HOSP PERFORMED  PROTIME-INR  APTT  T4, FREE  PROCALCITONIN  LACTIC ACID, PLASMA  LACTIC ACID, PLASMA  HIV ANTIBODY (ROUTINE TESTING W REFLEX)  CBC  BASIC METABOLIC PANEL  CBG MONITORING, ED    EKG EKG Interpretation  Date/Time:  Monday April 01 2021 18:16:05 EST Ventricular Rate:  136 PR Interval:  149 QRS Duration: 90 QT Interval:  310 QTC Calculation: 467 R Axis:   -23 Text Interpretation: Sinus tachycardia Borderline left axis deviation Low voltage, extremity and precordial leads Confirmed by Elnora Morrison [phone]) on 04/01/2021 7:18:40 PM  Radiology DG Chest Port 1 View  Result Date: 04/01/2021 CLINICAL DATA:  Questionable sepsis. EXAM: PORTABLE CHEST 1 VIEW COMPARISON:  Chest x-ray 08/30/2020.  CT of the chest 02/08/2021. FINDINGS: Right chest port catheter tip projects over the distal SVC. Chronic appearing scarring in the right apex and paramediastinal region appears unchanged. Bullae are again noted in the left lung apex. Nodular densities in the left lower lung persists, grossly unchanged. Artifact overlies is region. There is no new lung infiltrate, pleural effusion or pneumothorax identified. Cardiac silhouette is within normal limits. No acute fractures are seen. Air under the right hemidiaphragm is indeterminate. IMPRESSION: 1. Air under the right hemidiaphragm is indeterminate and may be within air distended bowel. If there is high clinical concern for free air recommend dedicated abdominal series with decubitus view. 2. Stable nodular densities in the left lower lung. No new lung infiltrate. Electronically Signed   By: Ronney Asters M.D.   On: 04/01/2021 21:28   CT HEAD CODE STROKE WO CONTRAST  Result Date: 04/01/2021 CLINICAL DATA:  Code stroke. EXAM: CT HEAD WITHOUT CONTRAST TECHNIQUE: Contiguous axial images were obtained from the base of the skull through the vertex  without intravenous contrast. COMPARISON:  03/13/2021. FINDINGS: Brain: Large area of hypodensity involving the left parietal and occipital lobe; this area previously contained an enhancing lesion and was the subject of recent SRS. No acute hemorrhage, mass effect, or midline shift. No hydrocephalus or extra-axial collection. Vascular: No hyperdense vessel or unexpected calcification. Skull: Status post right frontotemporal and left parietal craniotomies. No acute osseous abnormality. Sinuses/Orbits: Mucosal thickening in the right maxillary sinus. The orbits are unremarkable. Other: Fluid in the right mastoid air cells. ASPECTS [facility] Stroke Program Early CT Score) - Ganglionic level infarction (caudate, lentiform nuclei, internal capsule, insula, M1-M3 cortex): 7 - Supraganglionic infarction (M4-M6 cortex): 3 Total score (0-10 with 10 being normal): 10 IMPRESSION: 1. Hypodensity in the left parietal and occipital lobes, which is new from the prior exam but which correlates with an area of recent radiation therapy. This may represent acute infarct or edema related to recent SRS. 2. ASPECTS is 10 Code stroke imaging results were communicated on 04/01/2021 at 7:46 pm to provider Dr. Cheral Marker via secure text paging. Electronically Signed   By: Merilyn Baba M.D.   On: 04/01/2021 19:48   CT ANGIO HEAD NECK W WO CM W PERF (CODE STROKE)  Result Date: 04/01/2021 CLINICAL DATA:  Initial evaluation for neuro deficit, stroke suspected, aphasia. EXAM: CT ANGIOGRAPHY HEAD AND NECK CT PERFUSION BRAIN TECHNIQUE: Multidetector CT imaging of the head and neck was performed using the standard protocol during bolus administration of intravenous contrast. Multiplanar CT image reconstructions and MIPs were obtained to evaluate the vascular anatomy. Carotid stenosis measurements (when applicable) are obtained utilizing NASCET criteria, using the distal internal carotid diameter as the denominator. Multiphase CT imaging of the  brain was performed following IV bolus contrast injection. Subsequent parametric perfusion  maps were calculated using RAPID software. CONTRAST:  28mL OMNIPAQUE IOHEXOL 350 MG/ML SOLN COMPARISON:  Prior CT from earlier the same day. FINDINGS: CTA NECK FINDINGS Aortic arch: Visualized aortic arch normal caliber with normal 3 vessel morphology. No stenosis about the origin of the great vessels. Right carotid system: Right common and internal carotid arteries widely patent without stenosis, dissection or occlusion. Left carotid system: Left common and internal carotid arteries widely patent without stenosis, dissection or occlusion. Vertebral arteries: Both vertebral arteries arise from the subclavian arteries. No proximal subclavian artery stenosis. Both vertebral arteries widely patent without stenosis, dissection or occlusion. Skeleton: No discrete or worrisome osseous lesions. C3 and C4 vertebral bodies are fused. Other neck: Asymmetric thickening and enhancement seen about the right masseter muscle (series 5, image 88), nonspecific, but could reflect post radiation changes and/or myositis, which could be either infectious or inflammatory in nature. Adjacent right parotid gland and submandibular glands within normal limits. No other acute abnormality within the neck. Upper chest: Abnormal pleuroparenchymal thickening at the medial right upper lobe/right lung apex, likely reflecting post treatment changes. Severe bolus emphysematous changes within the visualized left lung. 12 mm left upper lobe nodule partially visualized (series 5, image 169). Right-sided Port-A-Cath noted. Review of the MIP images confirms the above findings CTA HEAD FINDINGS Anterior circulation: Both internal carotid arteries widely patent to the termini without stenosis. 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 No residual aneurysm visualized. A1 segments patent bilaterally. Left A1 hypoplastic. Normal  anterior communicating artery complex. Anterior cerebral arteries patent to their distal aspects without stenosis. No M1 stenosis or occlusion. Normal MCA bifurcations. Distal MCA branches perfused and symmetric. Posterior circulation: Both V4 segments patent to the vertebrobasilar junction without stenosis. Right PICA patent. Left PICA not well seen. Basilar patent to its distal aspect without stenosis. Superior cerebral arteries patent bilaterally. Both PCAs primarily supplied via the basilar well perfused or distal aspects. Venous sinuses: Grossly patent a large for timing the contrast bolus. Anatomic variants: None significant. Asymmetric left a meningeal enhancement at the left parietal region related to vasogenic edema and known metastasis at this location. Review of the MIP images confirms the above findings CT Brain Perfusion Findings: ASPECTS: 10. CBF (<30%) Volume: 96mL Perfusion (Tmax>6.0s) volume: 77mL Mismatch Volume: 57mL Infarction Location:Negative CT perfusion with no evidence for acute ischemia. Increased perfusion is seen within the left parietal region on dedicated perfusion maps, presumably related to vasogenic edema and known metastatic lesion at this location. Changes of acute seizure may be contributory. IMPRESSION: 1. Negative CTA for emergent large vessel occlusion. 2. Negative CT perfusion with no evidence for acute ischemia. 3. Mild for age atheromatous disease. No hemodynamically significant or correctable stenosis about the major arterial vasculature of the head and neck. 4. Increased perfusion with leptomeningeal enhancement about the left parietal region, presumably related to vasogenic edema and known metastasis at this location. Superimposed changes of acute seizure could also be considered. 5. 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 No residual aneurysm visualized. 6. Presumed post treatment changes at the right lung apex, with persistent 12 mm  left upper lobe nodule. 7. Asymmetric thickening and enhancement about the right masseter muscle, nonspecific, but suspected to reflect post radiation changes. Acute myositis, which could be either infectious or inflammatory, would be the primary differential consideration. 8. Emphysema (ICD10-J43.9). Results discussed by telephone at the time of interpretation on 04/01/2021 at approximately 8:15 p.m. to provider ERIC  [facility] , who verbally acknowledged these results. Electronically Signed   By: Jeannine Boga M.D.   On: 04/01/2021 21:07    Procedures Procedures   Medications Ordered in ED Medications  lactated ringers infusion ( Intravenous New Bag/Given 04/01/21 2152)  vancomycin (VANCOREADY) IVPB 1250 mg/250 mL (1,250 mg Intravenous New Bag/Given 04/01/21 2344)  levETIRAcetam (KEPPRA) 750 mg in sodium chloride 0.9 % 100 mL IVPB (has no administration in time range)  aspirin tablet 650 mg (650 mg Oral Not Given 04/02/21 0050)  vancomycin (VANCOREADY) IVPB 1250 mg/250 mL (has no administration in time range)  ceFEPIme (MAXIPIME) 2 g in sodium chloride 0.9 % 100 mL IVPB (has no administration in time range)  magnesium sulfate IVPB 2 g 50 mL (has no administration in time range)  enoxaparin (LOVENOX) injection 40 mg (has no administration in time range)  acetaminophen (TYLENOL) tablet 650 mg (has no administration in time range)    Or  acetaminophen (TYLENOL) suppository 650 mg (has no administration in time range)  ondansetron (ZOFRAN) tablet 4 mg (has no administration in time range)    Or  ondansetron (ZOFRAN) injection 4 mg (has no administration in time range)  LORazepam (ATIVAN) injection 1-2 mg (has no administration in time range)  lactated ringers bolus 1,000 mL (0 mLs Intravenous Stopped 04/01/21 2121)    And  lactated ringers bolus 500 mL (0 mLs Intravenous Stopped 04/01/21 2121)    And  lactated ringers bolus 250 mL (0 mLs Intravenous Stopped 04/01/21 2239)  ceFEPIme  (MAXIPIME) 2 g in sodium chloride 0.9 % 100 mL IVPB (0 g Intravenous Stopped 04/01/21 2053)  metroNIDAZOLE (FLAGYL) IVPB 500 mg (500 mg Intravenous New Bag/Given 04/01/21 2341)  levETIRAcetam (KEPPRA) IVPB 1000 mg/100 mL premix (0 mg Intravenous Stopped 04/01/21 2121)  iohexol (OMNIPAQUE) 350 MG/ML injection 75 mL (75 mLs Intravenous Contrast Given 04/01/21 1959)  LORazepam (ATIVAN) injection 1 mg (1 mg Intravenous Given 04/01/21 2133)  gadobutrol (GADAVIST) 1 MMOL/ML injection 5.5 mL (5.5 mLs Intravenous Contrast Given 04/01/21 2225)    ED Course  I have reviewed the triage vital signs and the nursing notes.  Pertinent labs & imaging results that were available during my care of the patient were reviewed by me and considered in my medical decision making (see chart for details).    MDM Rules/Calculators/A&P                          [name] [name] is a 65 y.o. male presenting with AMS. Initial VS sig for HTN, tachycardia and tachypnea.  BGL WNL.  Code stroke and code sepsis initiated on arrival.  EKG interpretation: Sinus tachycardia, rate 136 bpm, normal intervals, no ST elevations or depressions, nonspecific T wave changes.  Low voltage QRS.  Labs: Coags wnl, CBC with mild normocytic anemia, ammonia of 26, CMP unremarkable, hypomagnesemia.  UA with small hematuria and rare bacteria.  TSH elevated at 9.126, free T4 WNL.  COVID/flu negative.  UDS/etoh unremarkable.  BNP within normal limits.  Cultures obtained.  Initial lactic acid 2.2.  Imaging: CT head notable for hypodensities in the left parietal and occipital lobes of unclear etiology.  CTA negative for LVO, multiple acute on chronic changes as above with vasogenic edema.  Imaging was reviewed by radiology and personally by me.  DDX considered: Metastases, sepsis, bacteremia, meningitis, encephalopathy, CVA, hemorrhagic stroke, substance intoxication or withdrawal. History, examination, and objective data most consistent with possible  seizure, potentially secondary to progression of  metastases.  Sepsis protocol initiated, unclear etiology but patient met criteria based on tachycardia and lactic acidosis.  Neurologic exam with right-sided gaze preference and asymmetric weakness, but no LVO identified on imaging.  No signs of hemorrhagic process.  No signs of trauma on exam or history.  Given patient's immunocompromise secondary to cancer diagnosis and chemotherapy, infectious etiology not identified but remains possible.  Medications: Medications  lactated ringers infusion ( Intravenous New Bag/Given 04/01/21 2152)  vancomycin (VANCOREADY) IVPB 1250 mg/250 mL (1,250 mg Intravenous New Bag/Given 04/01/21 2344)  levETIRAcetam (KEPPRA) 750 mg in sodium chloride 0.9 % 100 mL IVPB (has no administration in time range)  aspirin tablet 650 mg (650 mg Oral Not Given 04/02/21 0050)  vancomycin (VANCOREADY) IVPB 1250 mg/250 mL (has no administration in time range)  ceFEPIme (MAXIPIME) 2 g in sodium chloride 0.9 % 100 mL IVPB (has no administration in time range)  magnesium sulfate IVPB 2 g 50 mL (has no administration in time range)  enoxaparin (LOVENOX) injection 40 mg (has no administration in time range)  acetaminophen (TYLENOL) tablet 650 mg (has no administration in time range)    Or  acetaminophen (TYLENOL) suppository 650 mg (has no administration in time range)  ondansetron (ZOFRAN) tablet 4 mg (has no administration in time range)    Or  ondansetron (ZOFRAN) injection 4 mg (has no administration in time range)  LORazepam (ATIVAN) injection 1-2 mg (has no administration in time range)  lactated ringers bolus 1,000 mL (0 mLs Intravenous Stopped 04/01/21 2121)    And  lactated ringers bolus 500 mL (0 mLs Intravenous Stopped 04/01/21 2121)    And  lactated ringers bolus 250 mL (0 mLs Intravenous Stopped 04/01/21 2239)  ceFEPIme (MAXIPIME) 2 g in sodium chloride 0.9 % 100 mL IVPB (0 g Intravenous Stopped 04/01/21 2053)   metroNIDAZOLE (FLAGYL) IVPB 500 mg (500 mg Intravenous New Bag/Given 04/01/21 2341)  levETIRAcetam (KEPPRA) IVPB 1000 mg/100 mL premix (0 mg Intravenous Stopped 04/01/21 2121)  iohexol (OMNIPAQUE) 350 MG/ML injection 75 mL (75 mLs Intravenous Contrast Given 04/01/21 1959)  LORazepam (ATIVAN) injection 1 mg (1 mg Intravenous Given 04/01/21 2133)  gadobutrol (GADAVIST) 1 MMOL/ML injection 5.5 mL (5.5 mLs Intravenous Contrast Given 04/01/21 2225)     Neurology consulted at the time of code stroke on initial patient exam, recommended MRI.  Re-evaluated prior to admission. Hemodynamically stable and in no acute distress.  Remains encephalopathic and confused, neuro exam unchanged.  Admitted to hospitalist in stable condition.  Continue to follow-up neurology recommendations, oncoming provider to follow-up on pending labs and imaging.  Family understands and agrees with the plan.   The plan for this patient was discussed with my attending physician, who voiced agreement and who oversaw evaluation and treatment of this patient.     Note: Estate manager/land agent was used in the creation of this note.  Final Clinical Impression(s) / ED Diagnoses Final diagnoses:  Encephalopathy    Rx / DC Orders ED Discharge Orders     None        Cherly Hensen, DO 04/02/21 0109    Elnora Morrison, MD 04/03/21 [phone]

## 2021-04-01 NOTE — ED Triage Notes (Signed)
Pt bib [facility] from home where he lives alone. Neighbor saw pt outside sitting in yard today for unknown period of time. Upon arrival pt is found to have tremors and aphasia. Pt seen this am by aide who reports some confusion but no aphasia. 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  EMS vitals: 134HR, 92CBG, 150/98

## 2021-04-01 NOTE — ED Notes (Signed)
[name] sister [phone] requesting an update

## 2021-04-01 NOTE — ED Notes (Signed)
Activate code stroke

## 2021-04-01 NOTE — Progress Notes (Signed)
Sepsis tracking by elink

## 2021-04-01 NOTE — ED Notes (Signed)
Changed patient and bed sheets. Put a brief on patient and put a primofit on.

## 2021-04-01 NOTE — ED Notes (Signed)
Phlebotomy at pt bedside for blood cultures

## 2021-04-01 NOTE — Progress Notes (Signed)
Pharmacy Antibiotic Note  [name] [name] is a 65 y.o. male admitted on 04/01/2021 with altered mental status and concern for sepsis of unknown source. 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 Unknown last known normal and code stroke was activated. Initial WBC 5.6 and Tmax 100.3 F. Scr at baseline at 0.70. In ED, started on flagyl 500mg  q12h. Pharmacy has been consulted for cefepime and vancomycin dosing.   AUC goal: 400-550   Plan: Vancomycin 1250 mg x 1 load Vancomycin 1250 mg q24h (Scr rounding to 0.8, eAUC 506) Cefepime 2g q8h F/u renal function and adjust regimen as needed  Temp (24hrs), Avg:100.3 F (37.9 C), Min:100.3 F (37.9 C), Max:100.3 F (37.9 C)  No results for input(s): WBC, CREATININE, LATICACIDVEN, VANCOTROUGH, VANCOPEAK, VANCORANDOM, GENTTROUGH, GENTPEAK, GENTRANDOM, TOBRATROUGH, TOBRAPEAK, TOBRARND, AMIKACINPEAK, AMIKACINTROU, AMIKACIN in the last 168 hours.  CrCl cannot be calculated (Patient's most recent lab result is older than the maximum 21 days allowed.).    Allergies  Allergen Reactions   Other Itching and Other (See Comments)    Seasonal allergies- Itchy eyes, runny nose, sneezing, scratchy throat    Antimicrobials this admission: Vancomycin 11/14 > Cefepime 11/14 > Flagyl 11/14 >  Microbiology results: 11/14 BCx: sent  Thank you for allowing pharmacy to participate in this patient's care.  Levonne Spiller, PharmD PGY1 Acute Care Resident  04/01/2021,8:56 PM

## 2021-04-02 ENCOUNTER — Ambulatory Visit: Payer: Medicaid Other | Admitting: Physician Assistant

## 2021-04-02 ENCOUNTER — Other Ambulatory Visit: Payer: Medicaid Other

## 2021-04-02 ENCOUNTER — Encounter: Payer: Self-pay | Admitting: Physician Assistant

## 2021-04-02 ENCOUNTER — Inpatient Hospital Stay (HOSPITAL_COMMUNITY): Payer: Medicaid Other

## 2021-04-02 ENCOUNTER — Encounter: Payer: Self-pay | Admitting: Internal Medicine

## 2021-04-02 ENCOUNTER — Observation Stay (HOSPITAL_COMMUNITY): Payer: Medicaid Other

## 2021-04-02 DIAGNOSIS — R4701 Aphasia: Secondary | ICD-10-CM | POA: Diagnosis present

## 2021-04-02 DIAGNOSIS — E873 Alkalosis: Secondary | ICD-10-CM | POA: Diagnosis present

## 2021-04-02 DIAGNOSIS — I3139 Other pericardial effusion (noninflammatory): Secondary | ICD-10-CM | POA: Diagnosis not present

## 2021-04-02 DIAGNOSIS — R569 Unspecified convulsions: Secondary | ICD-10-CM | POA: Diagnosis not present

## 2021-04-02 DIAGNOSIS — Z79899 Other long term (current) drug therapy: Secondary | ICD-10-CM | POA: Diagnosis not present

## 2021-04-02 DIAGNOSIS — R310 Gross hematuria: Secondary | ICD-10-CM | POA: Diagnosis not present

## 2021-04-02 DIAGNOSIS — J690 Pneumonitis due to inhalation of food and vomit: Secondary | ICD-10-CM | POA: Diagnosis present

## 2021-04-02 DIAGNOSIS — G9341 Metabolic encephalopathy: Secondary | ICD-10-CM | POA: Diagnosis not present

## 2021-04-02 DIAGNOSIS — G936 Cerebral edema: Secondary | ICD-10-CM | POA: Diagnosis present

## 2021-04-02 DIAGNOSIS — J9601 Acute respiratory failure with hypoxia: Secondary | ICD-10-CM | POA: Diagnosis not present

## 2021-04-02 DIAGNOSIS — Z85118 Personal history of other malignant neoplasm of bronchus and lung: Secondary | ICD-10-CM | POA: Diagnosis not present

## 2021-04-02 DIAGNOSIS — Z7951 Long term (current) use of inhaled steroids: Secondary | ICD-10-CM | POA: Diagnosis not present

## 2021-04-02 DIAGNOSIS — J439 Emphysema, unspecified: Secondary | ICD-10-CM | POA: Diagnosis present

## 2021-04-02 DIAGNOSIS — Z20822 Contact with and (suspected) exposure to covid-19: Secondary | ICD-10-CM | POA: Diagnosis present

## 2021-04-02 DIAGNOSIS — G40201 Localization-related (focal) (partial) symptomatic epilepsy and epileptic syndromes with complex partial seizures, not intractable, with status epilepticus: Secondary | ICD-10-CM | POA: Diagnosis present

## 2021-04-02 DIAGNOSIS — I959 Hypotension, unspecified: Secondary | ICD-10-CM | POA: Diagnosis not present

## 2021-04-02 DIAGNOSIS — R4189 Other symptoms and signs involving cognitive functions and awareness: Secondary | ICD-10-CM | POA: Diagnosis not present

## 2021-04-02 DIAGNOSIS — G9340 Encephalopathy, unspecified: Secondary | ICD-10-CM | POA: Diagnosis not present

## 2021-04-02 DIAGNOSIS — C7931 Secondary malignant neoplasm of brain: Secondary | ICD-10-CM | POA: Diagnosis present

## 2021-04-02 DIAGNOSIS — Z7189 Other specified counseling: Secondary | ICD-10-CM | POA: Diagnosis not present

## 2021-04-02 DIAGNOSIS — R4182 Altered mental status, unspecified: Secondary | ICD-10-CM | POA: Diagnosis present

## 2021-04-02 DIAGNOSIS — R092 Respiratory arrest: Secondary | ICD-10-CM | POA: Diagnosis present

## 2021-04-02 DIAGNOSIS — R339 Retention of urine, unspecified: Secondary | ICD-10-CM | POA: Diagnosis not present

## 2021-04-02 DIAGNOSIS — Z515 Encounter for palliative care: Secondary | ICD-10-CM | POA: Diagnosis not present

## 2021-04-02 DIAGNOSIS — J438 Other emphysema: Secondary | ICD-10-CM | POA: Diagnosis not present

## 2021-04-02 DIAGNOSIS — Z781 Physical restraint status: Secondary | ICD-10-CM | POA: Diagnosis not present

## 2021-04-02 DIAGNOSIS — Z87891 Personal history of nicotine dependence: Secondary | ICD-10-CM | POA: Diagnosis not present

## 2021-04-02 DIAGNOSIS — D849 Immunodeficiency, unspecified: Secondary | ICD-10-CM | POA: Diagnosis present

## 2021-04-02 DIAGNOSIS — Z8249 Family history of ischemic heart disease and other diseases of the circulatory system: Secondary | ICD-10-CM | POA: Diagnosis not present

## 2021-04-02 DIAGNOSIS — I10 Essential (primary) hypertension: Secondary | ICD-10-CM | POA: Diagnosis present

## 2021-04-02 DIAGNOSIS — E440 Moderate protein-calorie malnutrition: Secondary | ICD-10-CM | POA: Diagnosis present

## 2021-04-02 LAB — PROCALCITONIN: Procalcitonin: 0.15 ng/mL

## 2021-04-02 LAB — HEMOGLOBIN AND HEMATOCRIT, BLOOD
HCT: 33.5 % — ABNORMAL LOW (ref 39.0–52.0)
HCT: 30.4 % — ABNORMAL LOW (ref 39.0–52.0)
Hemoglobin: 10.6 g/dL — ABNORMAL LOW (ref 13.0–17.0)
Hemoglobin: 9.8 g/dL — ABNORMAL LOW (ref 13.0–17.0)

## 2021-04-02 LAB — TYPE AND SCREEN
ABO/RH(D): O POS
Antibody Screen: NEGATIVE

## 2021-04-02 LAB — CBC WITH DIFFERENTIAL/PLATELET
Abs Immature Granulocytes: 0.04 10*3/uL (ref 0.00–0.07)
Basophils Absolute: 0 10*3/uL (ref 0.0–0.1)
Basophils Relative: 0 %
Eosinophils Absolute: 0 10*3/uL (ref 0.0–0.5)
Eosinophils Relative: 0 %
HCT: 38.2 % — ABNORMAL LOW (ref 39.0–52.0)
Hemoglobin: 12.1 g/dL — ABNORMAL LOW (ref 13.0–17.0)
Immature Granulocytes: 0 %
Lymphocytes Relative: 4 %
Lymphs Abs: 0.4 10*3/uL — ABNORMAL LOW (ref 0.7–4.0)
MCH: 27.1 pg (ref 26.0–34.0)
MCHC: 31.7 g/dL (ref 30.0–36.0)
MCV: 85.7 fL (ref 80.0–100.0)
Monocytes Absolute: 0.4 10*3/uL (ref 0.1–1.0)
Monocytes Relative: 4 %
Neutro Abs: 9.9 10*3/uL — ABNORMAL HIGH (ref 1.7–7.7)
Neutrophils Relative %: 92 %
Platelets: 256 10*3/uL (ref 150–400)
RBC: 4.46 MIL/uL (ref 4.22–5.81)
RDW: 15.9 % — ABNORMAL HIGH (ref 11.5–15.5)
WBC: 10.8 10*3/uL — ABNORMAL HIGH (ref 4.0–10.5)
nRBC: 0 % (ref 0.0–0.2)

## 2021-04-02 LAB — BASIC METABOLIC PANEL
Anion gap: 11 (ref 5–15)
Anion gap: 10 (ref 5–15)
BUN: 6 mg/dL — ABNORMAL LOW (ref 8–23)
BUN: 5 mg/dL — ABNORMAL LOW (ref 8–23)
CO2: 21 mmol/L — ABNORMAL LOW (ref 22–32)
CO2: 23 mmol/L (ref 22–32)
Calcium: 9.1 mg/dL (ref 8.9–10.3)
Calcium: 8.9 mg/dL (ref 8.9–10.3)
Chloride: 104 mmol/L (ref 98–111)
Chloride: 100 mmol/L (ref 98–111)
Creatinine, Ser: 0.83 mg/dL (ref 0.61–1.24)
Creatinine, Ser: 0.76 mg/dL (ref 0.61–1.24)
GFR, Estimated: >60 mL/min (ref >60)
GFR, Estimated: >60 mL/min (ref >60)
Glucose, Bld: 113 mg/dL — ABNORMAL HIGH (ref 70–99)
Glucose, Bld: 129 mg/dL — ABNORMAL HIGH (ref 70–99)
Potassium: 3.9 mmol/L (ref 3.5–5.1)
Potassium: 3.8 mmol/L (ref 3.5–5.1)
Sodium: 136 mmol/L (ref 135–145)
Sodium: 133 mmol/L — ABNORMAL LOW (ref 135–145)

## 2021-04-02 LAB — POCT I-STAT 7, (LYTES, BLD GAS, ICA,H+H)
Acid-Base Excess: 0 mmol/L (ref 0.0–2.0)
Acid-base deficit: 2 mmol/L (ref 0.0–2.0)
Bicarbonate: 26.5 mmol/L (ref 20.0–28.0)
Bicarbonate: 25.3 mmol/L (ref 20.0–28.0)
Calcium, Ion: 1.3 mmol/L (ref 1.15–1.40)
Calcium, Ion: 1.25 mmol/L (ref 1.15–1.40)
HCT: 38 % — ABNORMAL LOW (ref 39.0–52.0)
HCT: 38 % — ABNORMAL LOW (ref 39.0–52.0)
Hemoglobin: 12.9 g/dL — ABNORMAL LOW (ref 13.0–17.0)
Hemoglobin: 12.9 g/dL — ABNORMAL LOW (ref 13.0–17.0)
O2 Saturation: 98 %
O2 Saturation: 99 %
Patient temperature: 98.6
Patient temperature: 37.2
Potassium: 3.5 mmol/L (ref 3.5–5.1)
Potassium: 4.1 mmol/L (ref 3.5–5.1)
Sodium: 138 mmol/L (ref 135–145)
Sodium: 138 mmol/L (ref 135–145)
TCO2: 28 mmol/L (ref 22–32)
TCO2: 27 mmol/L (ref 22–32)
pCO2 arterial: 59.7 mmHg — ABNORMAL HIGH (ref 32.0–48.0)
pCO2 arterial: 41.9 mmHg (ref 32.0–48.0)
pH, Arterial: 7.255 — ABNORMAL LOW (ref 7.350–7.450)
pH, Arterial: 7.391 (ref 7.350–7.450)
pO2, Arterial: 128 mmHg — ABNORMAL HIGH (ref 83.0–108.0)
pO2, Arterial: 136 mmHg — ABNORMAL HIGH (ref 83.0–108.0)

## 2021-04-02 LAB — URINALYSIS, ROUTINE W REFLEX MICROSCOPIC

## 2021-04-02 LAB — GLUCOSE, CAPILLARY
Glucose-Capillary: 134 mg/dL — ABNORMAL HIGH (ref 70–99)
Glucose-Capillary: 138 mg/dL — ABNORMAL HIGH (ref 70–99)
Glucose-Capillary: 151 mg/dL — ABNORMAL HIGH (ref 70–99)
Glucose-Capillary: 153 mg/dL — ABNORMAL HIGH (ref 70–99)

## 2021-04-02 LAB — PROTIME-INR
INR: 1 (ref 0.8–1.2)
Prothrombin Time: 13.3 seconds (ref 11.4–15.2)

## 2021-04-02 LAB — FIBRINOGEN: Fibrinogen: 491 mg/dL — ABNORMAL HIGH (ref 210–475)

## 2021-04-02 LAB — ECHOCARDIOGRAM COMPLETE
Area-P 1/2: 8.92 cm2
Calc EF: 60.9 %
Height: 60 in
S' Lateral: 3.3 cm
Single Plane A2C EF: 61.2 %
Single Plane A4C EF: 63.9 %

## 2021-04-02 LAB — URINALYSIS, MICROSCOPIC (REFLEX)
RBC / HPF: >50 RBC/hpf (ref 0–5)
Squamous Epithelial / HPF: NONE SEEN (ref 0–5)
WBC, UA: >50 WBC/hpf (ref 0–5)

## 2021-04-02 LAB — MAGNESIUM: Magnesium: 1.8 mg/dL (ref 1.7–2.4)

## 2021-04-02 LAB — MRSA NEXT GEN BY PCR, NASAL: MRSA by PCR Next Gen: NOT DETECTED

## 2021-04-02 LAB — CK: Total CK: 178 U/L (ref 49–397)

## 2021-04-02 LAB — PHOSPHORUS: Phosphorus: 3.4 mg/dL (ref 2.5–4.6)

## 2021-04-02 LAB — HIV ANTIBODY (ROUTINE TESTING W REFLEX): HIV Screen 4th Generation wRfx: NONREACTIVE

## 2021-04-02 LAB — LACTIC ACID, PLASMA: Lactic Acid, Venous: 1.1 mmol/L (ref 0.5–1.9)

## 2021-04-02 MED ORDER — OSMOLITE 1.2 CAL PO LIQD
1000.0000 mL | ORAL | Status: DC
  Administered 2021-04-02: 1000 mL

## 2021-04-02 MED ORDER — PROPOFOL 1000 MG/100ML IV EMUL
5.0000 ug/kg/min | INTRAVENOUS | Status: DC
  Administered 2021-04-02: 20 ug/kg/min via INTRAVENOUS
  Administered 2021-04-02: 30 ug/kg/min via INTRAVENOUS
  Filled 2021-04-02 (×2): qty 100

## 2021-04-02 MED ORDER — PROPOFOL 1000 MG/100ML IV EMUL
0.0000 ug/kg/min | INTRAVENOUS | Status: DC
  Filled 2021-04-02: qty 100

## 2021-04-02 MED ORDER — DOCUSATE SODIUM 100 MG PO CAPS: 100.0000 mg | ORAL_CAPSULE | Freq: Two times a day (BID) | ORAL | Status: DC | PRN

## 2021-04-02 MED ORDER — DOCUSATE SODIUM 50 MG/5ML PO LIQD: 100.0000 mg | Freq: Two times a day (BID) | ORAL | Status: DC | PRN

## 2021-04-02 MED ORDER — PROPOFOL 1000 MG/100ML IV EMUL
INTRAVENOUS | Status: AC
  Administered 2021-04-02: 3 ug/kg/min via INTRAVENOUS
  Filled 2021-04-02: qty 100

## 2021-04-02 MED ORDER — SUCCINYLCHOLINE CHLORIDE 200 MG/10ML IV SOSY
PREFILLED_SYRINGE | INTRAVENOUS | Status: AC
  Filled 2021-04-02: qty 10

## 2021-04-02 MED ORDER — LACTATED RINGERS IV SOLN: INTRAVENOUS | Status: DC

## 2021-04-02 MED ORDER — ETOMIDATE 2 MG/ML IV SOLN
INTRAVENOUS | Status: AC
  Filled 2021-04-02: qty 20

## 2021-04-02 MED ORDER — PROSOURCE TF PO LIQD
45.0000 mL | Freq: Two times a day (BID) | ORAL | Status: DC
  Administered 2021-04-02 – 2021-04-03 (×3): 45 mL
  Filled 2021-04-02 (×2): qty 45

## 2021-04-02 MED ORDER — VANCOMYCIN HCL 750 MG/150ML IV SOLN
750.0000 mg | Freq: Two times a day (BID) | INTRAVENOUS | Status: DC
Start: 2021-04-02 — End: 2021-04-03
  Administered 2021-04-02 – 2021-04-03 (×2): 750 mg via INTRAVENOUS
  Filled 2021-04-02 (×3): qty 150

## 2021-04-02 MED ORDER — CHLORHEXIDINE GLUCONATE 0.12% ORAL RINSE (MEDLINE KIT)
15.0000 mL | Freq: Two times a day (BID) | OROMUCOSAL | Status: DC
  Administered 2021-04-02 – 2021-04-04 (×5): 15 mL via OROMUCOSAL

## 2021-04-02 MED ORDER — ROCURONIUM BROMIDE 10 MG/ML (PF) SYRINGE
PREFILLED_SYRINGE | INTRAVENOUS | Status: AC
  Filled 2021-04-02: qty 10

## 2021-04-02 MED ORDER — BUDESONIDE 0.5 MG/2ML IN SUSP
0.5000 mg | Freq: Two times a day (BID) | RESPIRATORY_TRACT | Status: DC
  Administered 2021-04-02 – 2021-04-04 (×4): 0.5 mg via RESPIRATORY_TRACT
  Filled 2021-04-02 (×4): qty 2

## 2021-04-02 MED ORDER — PANTOPRAZOLE SODIUM 40 MG IV SOLR
40.0000 mg | INTRAVENOUS | Status: DC
Start: 2021-04-02 — End: 2021-04-02

## 2021-04-02 MED ORDER — POLYETHYLENE GLYCOL 3350 17 G PO PACK
17.0000 g | PACK | Freq: Every day | ORAL | Status: DC
  Administered 2021-04-02 – 2021-04-03 (×2): 17 g
  Filled 2021-04-02: qty 1

## 2021-04-02 MED ORDER — LEVETIRACETAM IN NACL 1000 MG/100ML IV SOLN
1000.0000 mg | Freq: Two times a day (BID) | INTRAVENOUS | Status: DC
  Administered 2021-04-02 – 2021-04-05 (×6): 1000 mg via INTRAVENOUS
  Filled 2021-04-02 (×6): qty 100

## 2021-04-02 MED ORDER — DEXTROSE-NACL 5-0.45 % IV SOLN: INTRAVENOUS | Status: DC

## 2021-04-02 MED ORDER — DEXAMETHASONE SODIUM PHOSPHATE 4 MG/ML IJ SOLN
4.0000 mg | Freq: Four times a day (QID) | INTRAMUSCULAR | Status: DC
  Administered 2021-04-02 – 2021-04-10 (×33): 4 mg via INTRAVENOUS
  Filled 2021-04-02 (×31): qty 1

## 2021-04-02 MED ORDER — DEXAMETHASONE SODIUM PHOSPHATE 10 MG/ML IJ SOLN
10.0000 mg | Freq: Once | INTRAMUSCULAR | Status: AC
  Administered 2021-04-02: 10 mg via INTRAVENOUS
  Filled 2021-04-02: qty 1

## 2021-04-02 MED ORDER — FAMOTIDINE IN NACL 20-0.9 MG/50ML-% IV SOLN: 20.0000 mg | Freq: Two times a day (BID) | INTRAVENOUS | Status: DC

## 2021-04-02 MED ORDER — ETOMIDATE 2 MG/ML IV SOLN
INTRAVENOUS | Status: DC | PRN
  Administered 2021-04-02: 20 mg via INTRAVENOUS

## 2021-04-02 MED ORDER — FENTANYL CITRATE PF 50 MCG/ML IJ SOSY
100.0000 ug | PREFILLED_SYRINGE | INTRAMUSCULAR | Status: AC | PRN
  Administered 2021-04-02 (×3): 100 ug via INTRAVENOUS
  Filled 2021-04-02 (×3): qty 2

## 2021-04-02 MED ORDER — IPRATROPIUM-ALBUTEROL 0.5-2.5 (3) MG/3ML IN SOLN
3.0000 mL | Freq: Four times a day (QID) | RESPIRATORY_TRACT | Status: DC
  Administered 2021-04-02 – 2021-04-04 (×9): 3 mL via RESPIRATORY_TRACT
  Filled 2021-04-02 (×9): qty 3

## 2021-04-02 MED ORDER — BUDESONIDE 0.25 MG/2ML IN SUSP
0.2500 mg | Freq: Two times a day (BID) | RESPIRATORY_TRACT | Status: DC
Start: 2021-04-02 — End: 2021-04-02
  Administered 2021-04-02: 0.25 mg via RESPIRATORY_TRACT
  Filled 2021-04-02: qty 2

## 2021-04-02 MED ORDER — FENTANYL CITRATE PF 50 MCG/ML IJ SOSY: 100.0000 ug | PREFILLED_SYRINGE | INTRAMUSCULAR | Status: DC | PRN

## 2021-04-02 MED ORDER — CHLORHEXIDINE GLUCONATE CLOTH 2 % EX PADS
6.0000 | MEDICATED_PAD | Freq: Every day | CUTANEOUS | Status: DC
  Administered 2021-04-02 – 2021-04-07 (×8): 6 via TOPICAL

## 2021-04-02 MED ORDER — POLYETHYLENE GLYCOL 3350 17 G PO PACK: 17.0000 g | PACK | Freq: Every day | ORAL | Status: DC | PRN

## 2021-04-02 MED ORDER — ROCURONIUM BROMIDE 50 MG/5ML IV SOLN
INTRAVENOUS | Status: DC | PRN
  Administered 2021-04-02: 100 mg via INTRAVENOUS

## 2021-04-02 MED ORDER — LABETALOL HCL 5 MG/ML IV SOLN: 10.0000 mg | INTRAVENOUS | Status: DC | PRN

## 2021-04-02 MED ORDER — ORAL CARE MOUTH RINSE
15.0000 mL | OROMUCOSAL | Status: DC
  Administered 2021-04-02 – 2021-04-04 (×21): 15 mL via OROMUCOSAL

## 2021-04-02 MED ORDER — PANTOPRAZOLE SODIUM 40 MG IV SOLR
40.0000 mg | Freq: Two times a day (BID) | INTRAVENOUS | Status: DC
  Administered 2021-04-02 – 2021-04-04 (×6): 40 mg via INTRAVENOUS
  Filled 2021-04-02 (×6): qty 40

## 2021-04-02 NOTE — Progress Notes (Signed)
   PCCM Interval note   Remains on propofol at 20 and on ceribell  RN reports bloody urine output and light coffee ground emesis/ mixed with slight tent in bright blood from OGT.   Labs reviewed:  CK 178, lactic normal, fibrinogen 491, INR 1  CT abd/ pelvis >> 1. Negative for pneumoperitoneum or bowel abnormality.   2. But positive for asymmetric delayed nephrograms (worse on the left) with abundant abnormal pararenal space stranding and/or fluid.  But no hydronephrosis or obstructing lesion. Bladder decompressed by Foley catheter, although with intermediate density filling defect in the bladder lumen.  Constellation suggests Acute Intrinsic Renal Failure. Query hematuria. Urinalysis recommended. 3. Positive also for patchy opacity in the lateral basal segment of the right lower lobe suspicious for Acute Bronchopneumonia. And trace pleural effusions are new since July. 4. And also new small volume pericardial effusion with possible complex fluid density. Query Pericarditis. 5. Enteric tube terminates in the gastric body.  O:  Blood pressure 99/72, pulse (!) 111, temperature 99 F (37.2 C), resp. rate 19, height 5' (1.524 m), SpO2 99 %.  Vent Mode: PRVC FiO2 (%):  [40 %-50 %] 40 % Set Rate:  [14 bmp-18 bmp] 18 bmp Vt Set:  [400 mL] 400 mL PEEP:  [5 cmH20] 5 cmH20 Plateau Pressure:  [11 cmH20-16 cmH20] 16 cmH20   General:  older male lightly sedated on MV HEENT: MM pink/moist, pupils 3/reactive, ETT/ OGT (light brown coffee ground/ faint red) Neuro: awakens to voice, follows simple commands in UE and attempts in LE.  UE requiring soft wrist restraints.  CV: rr, ST, no murmur PULM:  non labored, clear but diminished in bases, synchronous with MV breaths GI: soft, bs, NT, foley- bright red output Extremities: warm/dry, no LE edema  Skin: no rashes     P:  Continue cefepime/ vanc Send UC, follow cultures  Stop lovenox given gross hematuria  Recheck Hgb at 1600 TTE to evaluate  pericardial effusion  To be placed on cEEG; AED per neurology PPI BID  Start TF  Will consult urology to assist with hematuria        Kennieth Rad, ACNP [facility] 04/02/2021, 12:14 PM  See Amion for pager If no response to pager, please call PCCM consult pager After 7:00 pm call Elink

## 2021-04-02 NOTE — ED Notes (Signed)
This RN was assisting phlebotomy with blood draws when pt began to vomit while very altered. This RN suctioned vomit with a yaunker device and noted oxygen saturation at 70% room air, appeared to have compromised airway due to repeated emesis. This RN altered Dr. Alcario Drought, admitting provider, and Dr. Randal Buba, ED Provider. Per providers, staff preparing to intubate pt.

## 2021-04-02 NOTE — Procedures (Signed)
Patient Name: [name] [name]  MRN: [id number]  Epilepsy Attending: Lora Havens  Referring Physician/Provider: Dr. Kathrynn Speed Duration: 04/02/2021 0523 to 1028  Patient history:  65 year old male with SCLC and brain metastases who presents with new onset of aphasia.  Rapid EEG to evaluate for seizure.  Level of alertness: awake  AEDs during EEG study: LEV  Technical aspects: This EEG study was done with scalp electrodes positioned according to the 10-20 International system of electrode placement. Electrical activity was acquired at a sampling rate of 500Hz  and reviewed with a high frequency filter of 70Hz  and a low frequency filter of 1Hz . EEG data were recorded continuously and digitally stored.   Description: EEG showed continuous generalized 2 to 3 Hz delta slowing with overriding 15-18hz  generalized beta activity. Hyperventilation and photic stimulation were not performed.     Of note, study was technically difficult due to electrode artifact.  ABNORMALITY - Continuous slow, generalized - Excessive beta, generalized  IMPRESSION: This limited ceribell EEG is suggestive of severe diffuse encephalopathy, nonspecific etiology but could be secondary to medications like benzodiazepines.  No seizures or epileptiform discharges were seen throughout the recording.   If suspicion for ictal-interictal activity persists, please consider conventional EEG.  Marsi Turvey Barbra Sarks

## 2021-04-02 NOTE — Progress Notes (Signed)
LTM EEG hooked up and running - no initial skin breakdown - push button tested - neuro notified. [facility] monitoring.  

## 2021-04-02 NOTE — Progress Notes (Signed)
Subjective: No furtehr clinical seizures.   Exam: Vitals:   04/02/21 1000 04/02/21 1102  BP: 110/70 107/70  Pulse: (!) 112 (!) 111  Resp: 18 18  Temp: 99.3 F (37.4 C) 99.3 F (37.4 C)  SpO2: 100% 100%   Gen: In bed, ET tube in place.  Resp: non-labored breathing, no acute distress Abd: soft, nt  Neuro: MS: awakens to noxious stimulation, follows command to wiggle thumbs and toes.  CN: fixates and tracks across midline in each direction, PERRL Motor: follows commands x 4 Sensory:responds to nox stim x 4.   Pertinent Labs: BMP - unremarkable UDS negative  Impression: 65 yo M with new onset aphasia due to seizures in the setting of known intracranial metastasis. He seems to be improving, would like to get a formal EEG today. Given symptoms, would get a continuous EEG overnight tonight to catch subclinical seizures if they occur.   Recommendations: 1) Change ceribell to overnight EEG 2) Continue keppra at 1g BID 3) will continue to follow.   Roland Rack, MD [facility] [phone]  If 7pm- 7am, please page neurology on call as listed in [place].

## 2021-04-02 NOTE — Progress Notes (Signed)
Patient belongings: Cell phone, wallet with [phone] cash, ID, SS card, bank card, food stamp card, insurance card. HCPOA, [name] [name] given these items to secure at home. Clothing left at bedside.

## 2021-04-02 NOTE — Progress Notes (Signed)
eLink Physician-Brief Progress Note Patient Name: [name] [name] DOB: 1955/06/18 MRN: [id number]   Date of Service  04/02/2021  HPI/Events of Note  Patient needs an order for Propofol, and PRN Labetalol.  eICU Interventions  Orders entered.        Kerry Kass Champion Corales 04/02/2021, 6:17 AM

## 2021-04-02 NOTE — Progress Notes (Addendum)
Pt with episode of vomiting and witnessed aspiration in ED, RN suctioning.  Confirmed full code status with patients POA.  Informed them of plan to intubate due to vomiting and aspiration episode.  EDP now intubating patient.  PCCM called.  Spoke with Dr. Cheral Marker  1) Add dexamethasone 10mg  x1 then 4mg  Q6H. 2) PCCM coming to see pt. 3) Temp foley ordered 4) pt successfully intubated by EDP 5) ordering propofol gtt for sedation and PRN fentanyl per EDP sedation orders 6) will give in person hand off to PCCM on their evaluation of patient  CRITICAL CARE Performed by: Etta Quill.   Total critical care time: 30 minutes  Critical care time was exclusive of separately billable procedures and treating other patients.  Critical care was necessary to treat or prevent imminent or life-threatening deterioration.  Critical care was time spent personally by me on the following activities: development of treatment plan with patient and/or surrogate as well as nursing, discussions with consultants, evaluation of patient's response to treatment, examination of patient, obtaining history from patient or surrogate, ordering and performing treatments and interventions, ordering and review of laboratory studies, ordering and review of radiographic studies, pulse oximetry and re-evaluation of patient's condition.

## 2021-04-02 NOTE — Progress Notes (Signed)
eLink Physician-Brief Progress Note Patient Name: Jackey Housey DOB: 1955/09/27 MRN: [id number]   Date of Service  04/02/2021  HPI/Events of Note  Patient with a history of SCLC with metastasis to brain s/p craniotomy for tumor resection in the past, he presented with altered mental status, and did have tonic-clonic movements while in radiology, leading neurology to feel he is having complex partial seizures, he was intubated for altered mental status / airway protection.  eICU Interventions  New Patient Evaluation.        Kerry Kass Sitlaly Gudiel 04/02/2021, 5:20 AM

## 2021-04-02 NOTE — H&P (Addendum)
NAME:  [name] [name], MRN:  [id number], DOB:  July 05, 1955, LOS: 0 ADMISSION DATE:  04/01/2021, CONSULTATION DATE:  04/02/21 REFERRING MD:  Alcario Drought, CHIEF COMPLAINT:  seizures  History of Present Illness:  65 yo man with hx COPD, metastatic nsclc treated with chemo and immunotherapy, hx of crani, HTN, of R sided deficits from hx of brain tumor,  Brought to ED after being found outdoors  confused and aphasic by neighbors, presented with tremors, aphasia.   Seen by home health in am, acting somewhat confused but not aphasic.  Normal at 5 pm 11/13 pm per family.    On admit to ED, tachycardia tachypnea.   Started on vanc and cefepime and flagyl Ativan1mg  x 1 2L LR   2 spells of RLE jerking in CT  Neurology thought findings 2/2 partial complex seizure vs stroke.   Stereotatereotactic radiosurgery to brain x6 days ago.  radiosurgery to brain x6 days ago. Pertinent  Medical History  Emphysema  NSCLC, metastatic  HTN Iron deficiency anemia Seizure hx, brain mets ( on keppra at home) Tracheal mass   Significant Hospital Events: Including procedures, antibiotic start and stop dates in addition to other pertinent events     Interim History / Subjective:    Objective   Blood pressure 122/86, pulse (!) 116, temperature 100.3 F (37.9 C), temperature source Rectal, resp. rate 19, SpO2 97 %.        Intake/Output Summary (Last 24 hours) at 04/02/2021 0215 Last data filed at 04/01/2021 2239 Gross per 24 hour  Intake 2250 ml  Output --  Net 2250 ml   There were no vitals filed for this visit.  Examination: General: Intubated, NAD  HENT: pupils 49mm, intubated.  Lungs: CTAB Cardiovascular  sinus tach, no mgr  Abdomen: nt, nd, nbs some abdominal wall twitching?  Extremities: no edema, no erythema  Neuro: sedated, non responsive, no withdrawal to pain, some possible abdominal twitching  GU: bloody urine in foley   Utox negative  Blood cultures pending.   CT head 10/26:  IMPRESSION: New 1.1 cm metastasis of the parasagittal left parietal lobe with mild edema.   No evidence of recurrent disease at the operative site.  CXR: gastric tube needs advancing, ET tube fine, left lower lobe lesion, no new consolidation or abnormality   MRI  IMPRESSION: 1. Technically limited exam due to extensive motion artifact. 2. 1.4 cm metastasis involving the parasagittal left parietal lobe. Surrounding vasogenic edema without midline shift. 3. Diffuse dural thickening and enhancement about the adjacent craniotomy site, favored to be postoperative in nature, although attention at follow-up is recommended. 4. Question subtly increased diffusion signal abnormality at the left thalamus. Although this finding is not entirely certain given motion artifact on this exam, possible subtle changes of ischemia or possibly seizure could be considered. Additional mildly increased diffusion signal within the adjacent left parieto-occipital region could also reflect changes of acute seizure. Correlation with EEG recommended. 5. No other acute intracranial abnormality Resolved Hospital Problem list    Assessment & Plan:  AMS, aphasia, seizures:  Appears 2/2 brain metastasis and vasogenic edema Will discuss EEG findings with neurology.   Cont propofol  as already started. D/c fentanyl gtt.  Prn ativan for seizures, on Cont eeg.  Patient was started on antibiotics, initial low grade fever. Continue for now, d/c if cultures negative.   Resp Alkalosis - prior to intubation.  Recheck abg now.   Hematuria: new issue.  Recheck UA, labs, CPK.  Check CBC, fibrinogen and  INR.   COPD: stable, duonebs and pulmicort nebs to mimic home regimen while intubated.   TSH elevated, T4 OK   Best Practice (right click and "Reselect all SmartList Selections" daily)   Diet/type: tubefeeds consult placed  DVT prophylaxis: other held awaiting MRI results  GI prophylaxis: PPI Lines: N/A has port   Foley:  N/A Code Status:  full code Last date of multidisciplinary goals of care discussion []   Labs   CBC: Recent Labs  Lab 04/01/21 1859 04/01/21 1936  WBC 5.6  --   NEUTROABS 3.8  --   HGB 11.9* 13.6  13.6  HCT 37.8* 40.0  40.0  MCV 84.6  --   PLT 238  --     Basic Metabolic Panel: Recent Labs  Lab 04/01/21 1859 04/01/21 1936  NA 137 137  136  K 3.7 3.6  3.5  CL 105 105  CO2 19*  --   GLUCOSE 90 93  BUN 5* 3*  CREATININE 0.76 0.70  CALCIUM 9.4  --   MG 1.5*  --    GFR: Estimated Creatinine Clearance: 69 mL/min (by C-G formula based on SCr of 0.7 mg/dL). Recent Labs  Lab 04/01/21 1859 04/01/21 1905 04/01/21 2105  WBC 5.6  --   --   LATICACIDVEN  --  2.2* 1.9    Liver Function Tests: Recent Labs  Lab 04/01/21 1859  AST 27  ALT 12  ALKPHOS 53  BILITOT 0.4  PROT 6.7  ALBUMIN 3.4*   No results for input(s): LIPASE, AMYLASE in the last 168 hours. Recent Labs  Lab 04/01/21 1859  AMMONIA 26    ABG    Component Value Date/Time   HCO3 21.8 04/01/2021 1936   TCO2 23 04/01/2021 1936   TCO2 21 (L) 04/01/2021 1936   O2SAT 89.0 04/01/2021 1936     Coagulation Profile: Recent Labs  Lab 04/01/21 2000  INR 1.0    Cardiac Enzymes: No results for input(s): CKTOTAL, CKMB, CKMBINDEX, TROPONINI in the last 168 hours.  HbA1C: Hemoglobin A1C  Date/Time Value Ref Range Status  05/31/2018 04:54 PM 5.6 4.0 - 5.6 % Final   Hgb A1c MFr Bld  Date/Time Value Ref Range Status  05/29/2008 09:43 PM  4.6 - 6.1 % Final   5.7 (NOTE)   The ADA recommends the following therapeutic goal for glycemic   control related to Hgb A1C measurement:   Goal of Therapy:   < 7.0% Hgb A1C   Reference: American Diabetes Association: Clinical Practice   Recommendations 2008, Diabetes Care,  2008, 31:(Suppl 1).    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   Surgical History:   Past Surgical History:  Procedure Laterality Date   APPLICATION OF CRANIAL NAVIGATION N/A 08/30/2020   Procedure: APPLICATION OF CRANIAL NAVIGATION;  Surgeon: Consuella Lose, MD;  Location: [place];  Service: Neurosurgery;  Laterality: N/A;   CRANIOTOMY Left 08/30/2020   Procedure: Stereotactic left frontoparietal craniectomy for resection of tumor with brainlab;  Surgeon: Consuella Lose, MD;  Location: [place];  Service: Neurosurgery;  Laterality: Left;   ENDOBRONCHIAL ULTRASOUND N/A 12/27/2019   Procedure: ENDOBRONCHIAL ULTRASOUND;  Surgeon: Laurin Coder, MD;  Location: [facility];  Service: Endoscopy;  Laterality:  N/A;   FINE NEEDLE ASPIRATION  12/27/2019   Procedure: FINE NEEDLE ASPIRATION;  Surgeon: Laurin Coder, MD;  Location: [facility];  Service: Endoscopy;;   IR IMAGING GUIDED PORT INSERTION  11/30/2020   VIDEO BRONCHOSCOPY N/A 12/27/2019   Procedure: VIDEO BRONCHOSCOPY WITHOUT FLUORO;  Surgeon: Laurin Coder, MD;  Location: [facility];  Service: Endoscopy;  Laterality: N/A;     Social History:   reports that he has quit smoking. His smoking use included cigarettes. He smoked an average of .5 packs per day. He has never used smokeless tobacco. He reports that he does not currently use alcohol. He reports that he does not use drugs.   Family History:  His family history includes Hypertension in an other family member.   Allergies Allergies  Allergen Reactions   Other Itching and Other (See Comments)    Seasonal allergies- Itchy eyes, runny nose, sneezing, scratchy throat     Home Medications  Prior to Admission medications   Medication Sig Start Date End Date Taking? Authorizing Provider  acetaminophen (TYLENOL) 500 MG tablet Take 500 mg by mouth every 6  (six) hours as needed for mild pain or moderate pain.    [provider]  budesonide-formoterol (SYMBICORT) 80-4.5 MCG/ACT inhaler Inhale 2 puffs into the lungs 2 (two) times daily. Patient taking differently: Inhale 2 puffs into the lungs 2 (two) times daily as needed (for flares of wheezing and/or shortness of breath). 08/03/19   [name], NP  feeding supplement (ENSURE ENLIVE / ENSURE PLUS) LIQD Take 237 mLs by mouth 2 (two) times daily between meals. 03/15/20   [name], NP  folic acid (FOLVITE) 1 MG tablet Take 1 tablet (1 mg total) by mouth daily. 09/27/20   Curt Bears, MD  levETIRAcetam (KEPPRA) 500 MG tablet Take 1 tablet (500 mg total) by mouth 2 (two) times daily. 09/20/20   Samella Parr, NP  lidocaine-prilocaine (EMLA) cream Apply 1 application topically as needed. 12/17/20   Curt Bears, MD  Multiple Vitamin (MULTIVITAMIN) tablet Take 1 tablet by mouth daily.    [provider]  pantoprazole (PROTONIX) 40 MG tablet Take 1 tablet (40 mg total) by mouth daily with supper. 01/01/21   Charlott Rakes, MD  prochlorperazine (COMPAZINE) 10 MG tablet Take 1 tablet (10 mg total) by mouth every 6 (six) hours as needed for nausea or vomiting. 09/28/20   Curt Bears, MD  senna (SENOKOT) 8.6 MG TABS tablet Take 1 tablet (8.6 mg total) by mouth 2 (two) times daily. 09/20/20   Samella Parr, NP  tiotropium (SPIRIVA HANDIHALER) 18 MCG inhalation capsule Place 1 capsule into inhaler and inhale daily. 12/28/19   Nicolette Bang, MD  VENTOLIN HFA 108 [phone] Base) MCG/ACT inhaler Inhale 2 puffs into the lungs every 4 (four) hours as needed for wheezing or shortness of breath. 12/28/19   [provider]     Critical care time: 60 min

## 2021-04-02 NOTE — Consult Note (Signed)
H&P Physician requesting consult: Roland Rack  Chief Complaint: Gross hematuria  History of Present Illness: 65 year old male with history of small cell lung cancer with brain metastasis status postcraniotomy for tumor resection in the past.  He presented with altered mental status and seizure.  He was intubated for his altered mental status and to protect his airway.  Patient is unable to provide history.  He was noted to have gross hematuria today.  For that reason, he underwent a CT scan of the abdomen and pelvis without contrast that revealed asymmetric delayed nephrograms with renal stranding.  There was no hydronephrosis or obvious obstructing lesion.  The bladder was decompressed with Foley catheter with intermediate density filling defect in the bladder.  Most recent hemoglobin 10.6 from 12.9.  He has been afebrile with some tachycardia and mild hypotension.  He is currently on cefepime and vancomycin.  Creatinine normal at 0.76 with no leukocytosis.  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   Cancer ([place])    COPD (chronic obstructive pulmonary disease) ([place]) 11/02/2007   Qualifier: Diagnosis of  By: Melvyn Novas MD, Christena Deem    Hypertension 12/24/2019   Iron deficiency anemia 08/23/2007   Qualifier: Diagnosis of  By: Jim Like     Past Surgical History:  Procedure Laterality Date   APPLICATION OF CRANIAL NAVIGATION N/A 08/30/2020   Procedure: APPLICATION OF CRANIAL NAVIGATION;  Surgeon: Consuella Lose, MD;  Location: [facility];  Service: Neurosurgery;  Laterality: N/A;   CRANIOTOMY Left 08/30/2020   Procedure: Stereotactic left frontoparietal craniectomy for resection of tumor with brainlab;  Surgeon: Consuella Lose, MD;  Location: [facility];  Service: Neurosurgery;  Laterality: Left;   ENDOBRONCHIAL ULTRASOUND N/A 12/27/2019   Procedure: ENDOBRONCHIAL ULTRASOUND;  Surgeon: Laurin Coder, MD;  Location[facility];  Service: Endoscopy;   Laterality: N/A;   FINE NEEDLE ASPIRATION  12/27/2019   Procedure: FINE NEEDLE ASPIRATION;  Surgeon: Laurin Coder, MD;  [facility];  Service: Endoscopy;;   IR IMAGING GUIDED PORT INSERTION  11/30/2020   VIDEO BRONCHOSCOPY N/A 12/27/2019   Procedure: VIDEO BRONCHOSCOPY WITHOUT FLUORO;  Surgeon: Laurin Coder, MD;  [facility];  Service: Endoscopy;  Laterality: N/A;    Home Medications:  Medications Prior to Admission  Medication Sig Dispense Refill Last Dose   acetaminophen (TYLENOL) 500 MG tablet Take 500 mg by mouth every 6 (six) hours as needed for mild pain or moderate pain.      budesonide-formoterol (SYMBICORT) 80-4.5 MCG/ACT inhaler Inhale 2 puffs into the lungs 2 (two) times daily. (Patient taking differently: Inhale 2 puffs into the lungs 2 (two) times daily as needed (for flares of wheezing and/or shortness of breath).) 1 Inhaler 11    feeding supplement (ENSURE ENLIVE / ENSURE PLUS) LIQD Take 237 mLs by mouth 2 (two) times daily between meals. 121 mL 12    folic acid (FOLVITE) 1 MG tablet Take 1 tablet (1 mg total) by mouth daily. 30 tablet 4    levETIRAcetam (KEPPRA) 500 MG tablet Take 1 tablet (500 mg total) by mouth 2 (two) times daily. 60 tablet 2    lidocaine-prilocaine (EMLA) cream Apply 1 application topically as needed. 30 g 1    Multiple Vitamin (MULTIVITAMIN) tablet Take 1 tablet by mouth daily.      pantoprazole (PROTONIX) 40 MG tablet Take 1 tablet (40 mg total) by mouth daily with supper. 30 tablet 2    prochlorperazine (COMPAZINE) 10 MG tablet Take 1  tablet (10 mg total) by mouth every 6 (six) hours as needed for nausea or vomiting. 30 tablet 0    senna (SENOKOT) 8.6 MG TABS tablet Take 1 tablet (8.6 mg total) by mouth 2 (two) times daily. 120 tablet 0    tiotropium (SPIRIVA HANDIHALER) 18 MCG inhalation capsule Place 1 capsule into inhaler and inhale daily. 30 capsule 1    VENTOLIN HFA 108 (90 Base) MCG/ACT inhaler Inhale 2 puffs into the  lungs every 4 (four) hours as needed for wheezing or shortness of breath.      Allergies:  Allergies  Allergen Reactions   Other Itching and Other (See Comments)    Seasonal allergies- Itchy eyes, runny nose, sneezing, scratchy throat    Family History  Problem Relation Age of Onset   Hypertension Other    Social History:  reports that he has quit smoking. His smoking use included cigarettes. He smoked an average of .5 packs per day. He has never used smokeless tobacco. He reports that he does not currently use alcohol. He reports that he does not use drugs.  ROS: A complete review of systems was performed.  All systems are negative except for pertinent findings as noted. ROS   Physical Exam:  Vital signs in last 24 hours: Temp:  [98.1 F (36.7 C)-100 F (37.8 C)] 99.1 F (37.3 C) (11/15 1800) Pulse Rate:  [106-127] 109 (11/15 1800) Resp:  [10-36] 18 (11/15 1800) BP: (90-154)/(66-107) 99/66 (11/15 1800) SpO2:  [88 %-100 %] 100 % (11/15 1800) FiO2 (%):  [40 %-50 %] 40 % (11/15 1456) General: Intubated and sedated Genitourinary: Normal phallus.  Bilateral testicles descended without masses.  He has a temperature probe three-way catheter in place draining dark red urine  Laboratory Data:  Results for orders placed or performed during the hospital encounter of 04/01/21 (from the past 24 hour(s))  I-Stat venous blood gas, ED     Status: Abnormal   Collection Time: 04/01/21  7:36 PM  Result Value Ref Range   pH, Ven 7.512 (H) 7.250 - 7.430   pCO2, Ven 27.1 (L) 44.0 - 60.0 mmHg   pO2, Ven 49.0 (H) 32.0 - 45.0 mmHg   Bicarbonate 21.8 20.0 - 28.0 mmol/L   TCO2 23 22 - 32 mmol/L   O2 Saturation 89.0 %   Acid-Base Excess 0.0 0.0 - 2.0 mmol/L   Sodium 136 135 - 145 mmol/L   Potassium 3.5 3.5 - 5.1 mmol/L   Calcium, Ion 1.05 (L) 1.15 - 1.40 mmol/L   HCT 40.0 39.0 - 52.0 %   Hemoglobin 13.6 13.0 - 17.0 g/dL   Sample type VENOUS   I-stat chem 8, ED     Status: Abnormal    Collection Time: 04/01/21  7:36 PM  Result Value Ref Range   Sodium 137 135 - 145 mmol/L   Potassium 3.6 3.5 - 5.1 mmol/L   Chloride 105 98 - 111 mmol/L   BUN 3 (L) 8 - 23 mg/dL   Creatinine, Ser 0.70 0.61 - 1.24 mg/dL   Glucose, Bld 93 70 - 99 mg/dL   Calcium, Ion 1.00 (L) 1.15 - 1.40 mmol/L   TCO2 21 (L) 22 - 32 mmol/L   Hemoglobin 13.6 13.0 - 17.0 g/dL   HCT 40.0 39.0 - 52.0 %  Protime-INR     Status: None   Collection Time: 04/01/21  8:00 PM  Result Value Ref Range   Prothrombin Time 13.5 11.4 - 15.2 seconds   INR 1.0 0.8 -  1.2  APTT     Status: None   Collection Time: 04/01/21  8:00 PM  Result Value Ref Range   aPTT 34 24 - 36 seconds  Blood culture (routine x 2)     Status: None (Preliminary result)   Collection Time: 04/01/21  8:35 PM   Specimen: BLOOD RIGHT HAND  Result Value Ref Range   Specimen Description BLOOD RIGHT HAND    Special Requests      BOTTLES DRAWN AEROBIC AND ANAEROBIC Blood Culture results may not be optimal due to an inadequate volume of blood received in culture bottles   Culture      NO GROWTH < 24 HOURS Performed at [facility] [address] [place], [place] [address]    Report Status PENDING   Blood culture (routine x 2)     Status: None (Preliminary result)   Collection Time: 04/01/21  8:53 PM   Specimen: BLOOD  Result Value Ref Range   Specimen Description BLOOD LEFT ANTECUBITAL    Special Requests      BOTTLES DRAWN AEROBIC ONLY Blood Culture results may not be optimal due to an inadequate volume of blood received in culture bottles   Culture      NO GROWTH < 24 HOURS Performed at [facility] [address], [place], [place] [address]    Report Status PENDING   Lactic acid, plasma     Status: None   Collection Time: 04/01/21  9:05 PM  Result Value Ref Range   Lactic Acid, Venous 1.9 0.5 - 1.9 mmol/L  Basic metabolic panel     Status: Abnormal   Collection Time: 04/02/21  2:53 AM  Result Value Ref Range   Sodium 136  135 - 145 mmol/L   Potassium 3.9 3.5 - 5.1 mmol/L   Chloride 104 98 - 111 mmol/L   CO2 21 (L) 22 - 32 mmol/L   Glucose, Bld 113 (H) 70 - 99 mg/dL   BUN 6 (L) 8 - 23 mg/dL   Creatinine, Ser 0.83 0.61 - 1.24 mg/dL   Calcium 9.1 8.9 - 10.3 mg/dL   GFR, Estimated >60 >60 mL/min   Anion gap 11 5 - 15  Procalcitonin     Status: None   Collection Time: 04/02/21  2:53 AM  Result Value Ref Range   Procalcitonin 0.15 ng/mL  I-STAT 7, (LYTES, BLD GAS, ICA, H+H)     Status: Abnormal   Collection Time: 04/02/21  3:44 AM  Result Value Ref Range   pH, Arterial 7.255 (L) 7.350 - 7.450   pCO2 arterial 59.7 (H) 32.0 - 48.0 mmHg   pO2, Arterial 128 (H) 83.0 - 108.0 mmHg   Bicarbonate 26.5 20.0 - 28.0 mmol/L   TCO2 28 22 - 32 mmol/L   O2 Saturation 98.0 %   Acid-base deficit 2.0 0.0 - 2.0 mmol/L   Sodium 138 135 - 145 mmol/L   Potassium 3.5 3.5 - 5.1 mmol/L   Calcium, Ion 1.30 1.15 - 1.40 mmol/L   HCT 38.0 (L) 39.0 - 52.0 %   Hemoglobin 12.9 (L) 13.0 - 17.0 g/dL   Patient temperature 98.6 F    Collection site RADIAL, ALLEN'S TEST ACCEPTABLE    Drawn by Operator    Sample type ARTERIAL   Lactic acid, plasma     Status: None   Collection Time: 04/02/21  5:03 AM  Result Value Ref Range   Lactic Acid, Venous 1.1 0.5 - 1.9 mmol/L  HIV Antibody (  routine testing w rflx)     Status: None   Collection Time: 04/02/21  5:03 AM  Result Value Ref Range   HIV Screen 4th Generation wRfx Non Reactive Non Reactive  Urinalysis, Routine w reflex microscopic Nasal Mucosa     Status: Abnormal   Collection Time: 04/02/21  5:03 AM  Result Value Ref Range   Color, Urine RED (A) YELLOW   APPearance TURBID (A) CLEAR   Specific Gravity, Urine  1.005 - 1.030    TEST NOT REPORTED DUE TO COLOR INTERFERENCE OF URINE PIGMENT   pH  5.0 - 8.0    TEST NOT REPORTED DUE TO COLOR INTERFERENCE OF URINE PIGMENT   Glucose, UA (A) NEGATIVE mg/dL    TEST NOT REPORTED DUE TO COLOR INTERFERENCE OF URINE PIGMENT   Hgb urine  dipstick (A) NEGATIVE    TEST NOT REPORTED DUE TO COLOR INTERFERENCE OF URINE PIGMENT   Bilirubin Urine (A) NEGATIVE    TEST NOT REPORTED DUE TO COLOR INTERFERENCE OF URINE PIGMENT   Ketones, ur (A) NEGATIVE mg/dL    TEST NOT REPORTED DUE TO COLOR INTERFERENCE OF URINE PIGMENT   Protein, ur (A) NEGATIVE mg/dL    TEST NOT REPORTED DUE TO COLOR INTERFERENCE OF URINE PIGMENT   Nitrite (A) NEGATIVE    TEST NOT REPORTED DUE TO COLOR INTERFERENCE OF URINE PIGMENT   Leukocytes,Ua (A) NEGATIVE    TEST NOT REPORTED DUE TO COLOR INTERFERENCE OF URINE PIGMENT  CK     Status: None   Collection Time: 04/02/21  5:03 AM  Result Value Ref Range   Total CK 178 49 - 397 U/L  CBC with Differential/Platelet     Status: Abnormal   Collection Time: 04/02/21  5:03 AM  Result Value Ref Range   WBC 10.8 (H) 4.0 - 10.5 K/uL   RBC 4.46 4.22 - 5.81 MIL/uL   Hemoglobin 12.1 (L) 13.0 - 17.0 g/dL   HCT 38.2 (L) 39.0 - 52.0 %   MCV 85.7 80.0 - 100.0 fL   MCH 27.1 26.0 - 34.0 pg   MCHC 31.7 30.0 - 36.0 g/dL   RDW 15.9 (H) 11.5 - 15.5 %   Platelets 256 150 - 400 K/uL   nRBC 0.0 0.0 - 0.2 %   Neutrophils Relative % 92 %   Neutro Abs 9.9 (H) 1.7 - 7.7 K/uL   Lymphocytes Relative 4 %   Lymphs Abs 0.4 (L) 0.7 - 4.0 K/uL   Monocytes Relative 4 %   Monocytes Absolute 0.4 0.1 - 1.0 K/uL   Eosinophils Relative 0 %   Eosinophils Absolute 0.0 0.0 - 0.5 K/uL   Basophils Relative 0 %   Basophils Absolute 0.0 0.0 - 0.1 K/uL   Immature Granulocytes 0 %   Abs Immature Granulocytes 0.04 0.00 - 0.07 K/uL  Protime-INR     Status: None   Collection Time: 04/02/21  5:03 AM  Result Value Ref Range   Prothrombin Time 13.3 11.4 - 15.2 seconds   INR 1.0 0.8 - 1.2  Fibrinogen     Status: Abnormal   Collection Time: 04/02/21  5:03 AM  Result Value Ref Range   Fibrinogen 491 (H) 210 - 475 mg/dL  Basic metabolic panel     Status: Abnormal   Collection Time: 04/02/21  5:03 AM  Result Value Ref Range   Sodium 133 (L) 135 - 145  mmol/L   Potassium 3.8 3.5 - 5.1 mmol/L   Chloride 100 98 - 111 mmol/L  CO2 23 22 - 32 mmol/L   Glucose, Bld 129 (H) 70 - 99 mg/dL   BUN 5 (L) 8 - 23 mg/dL   Creatinine, Ser 0.76 0.61 - 1.24 mg/dL   Calcium 8.9 8.9 - 10.3 mg/dL   GFR, Estimated >60 >60 mL/min   Anion gap 10 5 - 15  MRSA Next Gen by PCR, Nasal     Status: None   Collection Time: 04/02/21  5:03 AM   Specimen: Nasal Mucosa; Nasal Swab  Result Value Ref Range   MRSA by PCR Next Gen NOT DETECTED NOT DETECTED  Urinalysis, Microscopic (reflex)     Status: Abnormal   Collection Time: 04/02/21  5:03 AM  Result Value Ref Range   RBC / HPF >50 0 - 5 RBC/hpf   WBC, UA >50 0 - 5 WBC/hpf   Bacteria, UA MANY (A) NONE SEEN   Squamous Epithelial / LPF NONE SEEN 0 - 5  I-STAT 7, (LYTES, BLD GAS, ICA, H+H)     Status: Abnormal   Collection Time: 04/02/21  8:19 AM  Result Value Ref Range   pH, Arterial 7.391 7.350 - 7.450   pCO2 arterial 41.9 32.0 - 48.0 mmHg   pO2, Arterial 136 (H) 83.0 - 108.0 mmHg   Bicarbonate 25.3 20.0 - 28.0 mmol/L   TCO2 27 22 - 32 mmol/L   O2 Saturation 99.0 %   Acid-Base Excess 0.0 0.0 - 2.0 mmol/L   Sodium 138 135 - 145 mmol/L   Potassium 4.1 3.5 - 5.1 mmol/L   Calcium, Ion 1.25 1.15 - 1.40 mmol/L   HCT 38.0 (L) 39.0 - 52.0 %   Hemoglobin 12.9 (L) 13.0 - 17.0 g/dL   Patient temperature 37.2 C    Collection site RADIAL, ALLEN'S TEST ACCEPTABLE    Drawn by RT    Sample type ARTERIAL   Glucose, capillary     Status: Abnormal   Collection Time: 04/02/21  1:03 PM  Result Value Ref Range   Glucose-Capillary 134 (H) 70 - 99 mg/dL  Magnesium     Status: None   Collection Time: 04/02/21  2:12 PM  Result Value Ref Range   Magnesium 1.8 1.7 - 2.4 mg/dL  Phosphorus     Status: None   Collection Time: 04/02/21  2:12 PM  Result Value Ref Range   Phosphorus 3.4 2.5 - 4.6 mg/dL  Hemoglobin and hematocrit, blood     Status: Abnormal   Collection Time: 04/02/21  2:12 PM  Result Value Ref Range    Hemoglobin 10.6 (L) 13.0 - 17.0 g/dL   HCT 33.5 (L) 39.0 - 52.0 %  Glucose, capillary     Status: Abnormal   Collection Time: 04/02/21  3:27 PM  Result Value Ref Range   Glucose-Capillary 138 (H) 70 - 99 mg/dL  Type and screen [place]     Status: None   Collection Time: 04/02/21  4:19 PM  Result Value Ref Range   ABO/RH(D) O POS    Antibody Screen NEG    Sample Expiration      04/05/2021,2359 Performed at [facility], [address] [place],  [address]   Glucose, capillary     Status: Abnormal   Collection Time: 04/02/21  7:26 PM  Result Value Ref Range   Glucose-Capillary 151 (H) 70 - 99 mg/dL   Recent Results (from the past 240 hour(s))  Resp Panel by RT-PCR (Flu A&B, Covid)     Status: None  Collection Time: 04/01/21  7:10 PM   Specimen: Nasopharyngeal(NP) swabs in vial transport medium  Result Value Ref Range Status   SARS Coronavirus 2 by RT PCR NEGATIVE NEGATIVE Final    Comment: (NOTE) SARS-CoV-2 target nucleic acids are NOT DETECTED.  The SARS-CoV-2 RNA is generally detectable in upper respiratory specimens during the acute phase of infection. The lowest concentration of SARS-CoV-2 viral copies this assay can detect is 138 copies/mL. A negative result does not preclude SARS-Cov-2 infection and should not be used as the sole basis for treatment or other patient management decisions. A negative result may occur with  improper specimen collection/handling, submission of specimen other than nasopharyngeal swab, presence of viral mutation(s) within the areas targeted by this assay, and inadequate number of viral copies(<138 copies/mL). A negative result must be combined with clinical observations, patient history, and epidemiological information. The expected result is Negative.  Fact Sheet for Patients:  EntrepreneurPulse.com.au  Fact Sheet for Healthcare Providers:   IncredibleEmployment.be  This test is no t yet approved or cleared by the [place] FDA and  has been authorized for detection and/or diagnosis of SARS-CoV-2 by FDA under an Emergency Use Authorization (EUA). This EUA will remain  in effect (meaning this test can be used) for the duration of the COVID-19 declaration under Section 564(b)(1) of the Act, 21 U.S.C.section 360bbb-3(b)(1), unless the authorization is terminated  or revoked sooner.       Influenza A by PCR NEGATIVE NEGATIVE Final   Influenza B by PCR NEGATIVE NEGATIVE Final    Comment: (NOTE) The Xpert Xpress SARS-CoV-2/FLU/RSV plus assay is intended as an aid in the diagnosis of influenza from Nasopharyngeal swab specimens and should not be used as a sole basis for treatment. Nasal washings and aspirates are unacceptable for Xpert Xpress SARS-CoV-2/FLU/RSV testing.  Fact Sheet for Patients: EntrepreneurPulse.com.au  Fact Sheet for Healthcare Providers: IncredibleEmployment.be  This test is not yet approved or cleared by the [place] FDA and has been authorized for detection and/or diagnosis of SARS-CoV-2 by FDA under an Emergency Use Authorization (EUA). This EUA will remain in effect (meaning this test can be used) for the duration of the COVID-19 declaration under Section 564(b)(1) of the Act, 21 U.S.C. section 360bbb-3(b)(1), unless the authorization is terminated or revoked.  Performed at [facility], [place] [address] [place], [place] [address]   Blood culture (routine x 2)     Status: None (Preliminary result)   Collection Time: 04/01/21  8:35 PM   Specimen: BLOOD RIGHT HAND  Result Value Ref Range Status   Specimen Description BLOOD RIGHT HAND  Final   Special Requests   Final    BOTTLES DRAWN AEROBIC AND ANAEROBIC Blood Culture results may not be optimal due to an inadequate volume of blood received in culture bottles   Culture   Final     NO GROWTH < 24 HOURS Performed at [facility], [place] [address], [place], [place] [address]    Report Status PENDING  Incomplete  Blood culture (routine x 2)     Status: None (Preliminary result)   Collection Time: 04/01/21  8:53 PM   Specimen: BLOOD  Result Value Ref Range Status   Specimen Description BLOOD LEFT ANTECUBITAL  Final   Special Requests   Final    BOTTLES DRAWN AEROBIC ONLY Blood Culture results may not be optimal due to an inadequate volume of blood received in culture bottles   Culture   Final  NO GROWTH < 24 HOURS Performed at [facility] [address], [place], [place] [address]    Report Status PENDING  Incomplete  MRSA Next Gen by PCR, Nasal     Status: None   Collection Time: 04/02/21  5:03 AM   Specimen: Nasal Mucosa; Nasal Swab  Result Value Ref Range Status   MRSA by PCR Next Gen NOT DETECTED NOT DETECTED Final    Comment: (NOTE) The GeneXpert MRSA Assay (FDA approved for NASAL specimens only), is one component of a comprehensive MRSA colonization surveillance program. It is not intended to diagnose MRSA infection nor to guide or monitor treatment for MRSA infections. Test performance is not FDA approved in patients less than 73 years old. Performed at [facility], [place] [address] [place], [place] [address]    Creatinine: Recent Labs    04/01/21 1859 04/01/21 1936 04/02/21 0253 04/02/21 0503  CREATININE 0.76 0.70 0.83 0.76   Procedure: I removed the patient's current Foley catheter.  Under sterile conditions, I advanced a 74 [place] three-way hematuria catheter into the urethra and into the bladder.  There was return of dark red urine.  I filled the catheter balloon with 30 cc of sterile water.  I plugged the third port.  I then manually irrigated the bladder with sterile water, approximately 1 L.  There was return of moderate amounts of clot.  I irrigated until the return was completely clear.  There was no evidence of any  significant active bleeding from the bladder and so I left the catheter to drainage and did not start CBI.  Impression/Assessment:  Gross hematuria  Plan:  Continue Foley catheter.  Nursing may irrigate the catheter as needed.  Undergo voiding trial when clinically improves.  He can follow-up in clinic once he has clinically improved and plan for cystoscopy.  Marton Redwood, III 04/02/2021, 7:31 PM

## 2021-04-02 NOTE — Progress Notes (Signed)
Initial Nutrition Assessment  DOCUMENTATION CODES:  Non-severe (moderate) malnutrition in context of chronic illness  INTERVENTION:  Initiate tube feeding via OGT: Osmolite 1.2 at 20 ml/h and increase by 10 ml every 6 hrs to goal rate of 50 ml/h (1200 ml per day) ProSource TF 45 ml BID  Provides 1520 kcal, 89 gm protein, 1032 ml free water daily.  Free water per CCM.  Monitor magnesium and phosphorus every 12 hours x 4 occurrences, MD to replete as needed, as pt is at risk for refeeding syndrome.  NUTRITION DIAGNOSIS:  Moderate Malnutrition related to chronic illness, cancer and cancer related treatments as evidenced by moderate fat depletion, moderate muscle depletion, percent weight loss.  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 11/15 - OG placed, intubated  Spoke with RN at bedside. RN reports no plan to extubate today given that team is still trying to determine what is going on with the patient.  Patient is currently intubated on ventilator support. MV: 7.2 L/min Temp (24hrs), Avg:99 F (37.2 C), Min:98.1 F (36.7 C), Max:100.3 F (37.9 C)  Propofol: 6.7 ml/hr - provides 177 kcal/day  Per Epic, pt has lost ~11 lbs (8.4%) in the last 3.5 months, which is significant and severe for the time frame.   Pt also needs admission weight. RD to order.  Medications: reviewed; dexamethasone, Protonix, miralax, LR @ 150 ml/hr, Keppra BID per IV, Zofran PRN (given once today)  Labs: reviewed; Na 133 (L), Glucose 129 (H)  NUTRITION - FOCUSED PHYSICAL EXAM: Flowsheet Row Most Recent Value  Orbital Region Moderate depletion  Upper Arm  Region Moderate depletion  Thoracic and Lumbar Region No depletion  Buccal Region Unable to assess  Temple Region Moderate depletion  Clavicle Bone Region Moderate depletion  Clavicle and Acromion Bone Region Moderate depletion  Scapular Bone Region Moderate depletion  Dorsal Hand Unable to assess  Patellar Region Mild depletion  Anterior Thigh Region Mild depletion  Posterior Calf Region Moderate depletion  Edema (RD Assessment) None  Hair Reviewed  Eyes Reviewed  Mouth Unable to assess  Skin Reviewed  Nails Unable to assess   Diet Order:   Diet Order             Diet NPO time specified  Diet effective now                  EDUCATION NEEDS:  No education needs have been identified at this time  Skin:  Skin Assessment: Reviewed RN Assessment  Last BM:  04/02/21 - Type 6, small  Height:  Ht Readings from Last 1 Encounters:  04/02/21 5' (1.524 m)   Weight:  Wt Readings from Last 1 Encounters:  03/25/21 55.6 kg   BMI:  Body mass index is 23.94 kg/m.  Estimated Nutritional Needs:  Kcal:  1450 Protein:  80-95 grams Fluid:  >1.45 L  Derrel Nip, RD, LDN (she/her/hers) Clinical Inpatient Dietitian RD Pager/After-Hours/Weekend Pager # in [phone]

## 2021-04-02 NOTE — ED Provider Notes (Signed)
Called to the bedside for recurrent seizures and vomiting and likely aspiration.  O2 saturation was reportedly 70%.   Physical Exam  BP 122/86   Pulse (!) 116   Temp 100.3 F (37.9 C) (Rectal)   Resp 19   SpO2 97%   Physical Exam  ED Course/Procedures     Procedure Name: Intubation Date/Time: 04/02/2021 2:18 AM Performed by: Veatrice Kells, MD Pre-anesthesia Checklist: Patient identified Oxygen Delivery Method: Ambu bag Preoxygenation: Pre-oxygenation with 100% oxygen Induction Type: Rapid sequence Ventilation: Mask ventilation without difficulty Laryngoscope Size: Glidescope and 4 Grade View: Grade III Tube size: 7.5 mm Number of attempts: 1 Airway Equipment and Method: Patient positioned with wedge pillow Placement Confirmation: ETT inserted through vocal cords under direct vision, Positive ETCO2, CO2 detector and Breath sounds checked- equal and bilateral Secured at: 24 cm Tube secured with: ETT holder Dental Injury: Teeth and Oropharynx as per pre-operative assessment  Difficulty Due To: Difficulty was anticipated, Difficult Airway- due to anterior larynx, Difficult Airway- due to immobile epiglottis and Difficult Airway- due to limited oral opening     MDM   Intubated successfully.  To the ICU for status epilepticus with vomiting and aspiration      [name] Hellenbrand, MD 04/02/21 0222

## 2021-04-02 NOTE — Progress Notes (Signed)
Pharmacy Antibiotic Note  [name] [name] is a 65 y.o. male admitted on 04/01/2021 with altered mental status and concern for sepsis, possible CNS involvement. 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  Pharmacy consulted for vancomycin and cefepime dosing.  Renal function stable, afebrile, WBC up to 10.8, PCT 0.15, LA 1.1  Plan: Change vanc to 750mg  IV Q12 for goal through 15-20 mcg/mL Cefepime 2gm IV Q8H Monitor renal fxn, clinical progress, vanc trough at Css  Temp (24hrs), Avg:99 F (37.2 C), Min:98.1 F (36.7 C), Max:100.3 F (37.9 C)  Recent Labs  Lab 04/01/21 1859 04/01/21 1905 04/01/21 1936 04/01/21 2105 04/02/21 0253 04/02/21 0503  WBC 5.6  --   --   --   --  10.8*  CREATININE 0.76  --  0.70  --  0.83 0.76  LATICACIDVEN  --  2.2*  --  1.9  --  1.1     Estimated Creatinine Clearance: 66 mL/min (by C-G formula based on SCr of 0.76 mg/dL).    Allergies  Allergen Reactions   Other Itching and Other (See Comments)    Seasonal allergies- Itchy eyes, runny nose, sneezing, scratchy throat   Vanc 11/14 >> Cefepime 11/14 >> Flagyl x1 11/14  11/14 BCx -  11/15 MRSA PCR - negative  [name] [name], PharmD, BCPS, [place] 04/02/2021, 10:10 AM

## 2021-04-03 ENCOUNTER — Inpatient Hospital Stay (HOSPITAL_COMMUNITY): Payer: Medicaid Other

## 2021-04-03 ENCOUNTER — Ambulatory Visit: Payer: Medicaid Other

## 2021-04-03 DIAGNOSIS — C7931 Secondary malignant neoplasm of brain: Secondary | ICD-10-CM | POA: Diagnosis not present

## 2021-04-03 DIAGNOSIS — R569 Unspecified convulsions: Secondary | ICD-10-CM | POA: Diagnosis not present

## 2021-04-03 DIAGNOSIS — G9340 Encephalopathy, unspecified: Secondary | ICD-10-CM | POA: Diagnosis not present

## 2021-04-03 DIAGNOSIS — R092 Respiratory arrest: Secondary | ICD-10-CM

## 2021-04-03 DIAGNOSIS — J9601 Acute respiratory failure with hypoxia: Secondary | ICD-10-CM | POA: Diagnosis not present

## 2021-04-03 LAB — TRIGLYCERIDES: Triglycerides: 60 mg/dL (ref <150)

## 2021-04-03 LAB — MAGNESIUM: Magnesium: 1.8 mg/dL (ref 1.7–2.4)

## 2021-04-03 LAB — RENAL FUNCTION PANEL
Albumin: 2.6 g/dL — ABNORMAL LOW (ref 3.5–5.0)
Anion gap: 8 (ref 5–15)
BUN: 9 mg/dL (ref 8–23)
CO2: 23 mmol/L (ref 22–32)
Calcium: 8.5 mg/dL — ABNORMAL LOW (ref 8.9–10.3)
Chloride: 106 mmol/L (ref 98–111)
Creatinine, Ser: 0.79 mg/dL (ref 0.61–1.24)
GFR, Estimated: >60 mL/min (ref >60)
Glucose, Bld: 128 mg/dL — ABNORMAL HIGH (ref 70–99)
Phosphorus: 3.1 mg/dL (ref 2.5–4.6)
Potassium: 3.7 mmol/L (ref 3.5–5.1)
Sodium: 137 mmol/L (ref 135–145)

## 2021-04-03 LAB — CBC
HCT: 30.8 % — ABNORMAL LOW (ref 39.0–52.0)
Hemoglobin: 9.7 g/dL — ABNORMAL LOW (ref 13.0–17.0)
MCH: 26.9 pg (ref 26.0–34.0)
MCHC: 31.5 g/dL (ref 30.0–36.0)
MCV: 85.3 fL (ref 80.0–100.0)
Platelets: 229 10*3/uL (ref 150–400)
RBC: 3.61 MIL/uL — ABNORMAL LOW (ref 4.22–5.81)
RDW: 16.5 % — ABNORMAL HIGH (ref 11.5–15.5)
WBC: 9.5 10*3/uL (ref 4.0–10.5)
nRBC: 0 % (ref 0.0–0.2)

## 2021-04-03 LAB — GLUCOSE, CAPILLARY
Glucose-Capillary: 131 mg/dL — ABNORMAL HIGH (ref 70–99)
Glucose-Capillary: 145 mg/dL — ABNORMAL HIGH (ref 70–99)
Glucose-Capillary: 120 mg/dL — ABNORMAL HIGH (ref 70–99)
Glucose-Capillary: 108 mg/dL — ABNORMAL HIGH (ref 70–99)
Glucose-Capillary: 104 mg/dL — ABNORMAL HIGH (ref 70–99)
Glucose-Capillary: 112 mg/dL — ABNORMAL HIGH (ref 70–99)

## 2021-04-03 LAB — URINE CULTURE: Culture: NO GROWTH

## 2021-04-03 MED ORDER — SODIUM CHLORIDE 0.9 % IV SOLN
1.0000 g | INTRAVENOUS | Status: AC
  Administered 2021-04-03 – 2021-04-07 (×5): 1 g via INTRAVENOUS
  Filled 2021-04-03 (×5): qty 10

## 2021-04-03 MED ORDER — ETOMIDATE 2 MG/ML IV SOLN
20.0000 mg | Freq: Once | INTRAVENOUS | Status: AC
Start: 2021-04-03 — End: 2021-04-03
  Administered 2021-04-03: 20 mg via INTRAVENOUS

## 2021-04-03 MED ORDER — FENTANYL CITRATE PF 50 MCG/ML IJ SOSY
25.0000 ug | PREFILLED_SYRINGE | INTRAMUSCULAR | Status: DC | PRN
  Administered 2021-04-03: 50 ug via INTRAVENOUS
  Filled 2021-04-03 (×2): qty 1

## 2021-04-03 MED ORDER — MAGNESIUM SULFATE 2 GM/50ML IV SOLN
2.0000 g | Freq: Once | INTRAVENOUS | Status: AC
  Administered 2021-04-03: 2 g via INTRAVENOUS
  Filled 2021-04-03: qty 50

## 2021-04-03 MED ORDER — LACTATED RINGERS IV BOLUS
500.0000 mL | Freq: Once | INTRAVENOUS | Status: AC
  Administered 2021-04-03: 500 mL via INTRAVENOUS

## 2021-04-03 MED ORDER — ETOMIDATE 2 MG/ML IV SOLN
INTRAVENOUS | Status: AC
  Filled 2021-04-03: qty 10

## 2021-04-03 MED ORDER — DEXMEDETOMIDINE HCL IN NACL 400 MCG/100ML IV SOLN
0.0000 ug/kg/h | INTRAVENOUS | Status: DC
  Administered 2021-04-03: 0.4 ug/kg/h via INTRAVENOUS
  Filled 2021-04-03: qty 100

## 2021-04-03 MED ORDER — POTASSIUM CHLORIDE 20 MEQ PO PACK
40.0000 meq | PACK | Freq: Once | ORAL | Status: AC
  Administered 2021-04-03: 40 meq
  Filled 2021-04-03: qty 2

## 2021-04-03 MED ORDER — LACTATED RINGERS IV BOLUS: 500.0000 mL | Freq: Once | INTRAVENOUS | Status: AC

## 2021-04-03 MED ORDER — FENTANYL CITRATE PF 50 MCG/ML IJ SOSY
PREFILLED_SYRINGE | INTRAMUSCULAR | Status: AC
  Administered 2021-04-03: 50 ug
  Filled 2021-04-03: qty 1

## 2021-04-03 MED ORDER — DEXMEDETOMIDINE HCL IN NACL 400 MCG/100ML IV SOLN
0.2000 ug/kg/h | INTRAVENOUS | Status: DC
  Filled 2021-04-03: qty 100

## 2021-04-03 NOTE — Progress Notes (Signed)
  Patient with ongoing intermittent episodes of being sedated/ minimally responsive to agitated and trying to get out of bed.    Patient found with ETT dislodged but was able to advance with good end tidal color change, breath sounds and normal pressures on vent.    Patient has been weaning today PSV 5/5 and has been on low dose precedex.    Decision was made to extubate.  Thus far patient is tolerating well on 2L [place].  Has spoken his name, continues to MAE, and remains on cEEG.   He is high risk for re-intubation, will continue to closely monitor.     Peggy, patient's POV updated by phone.      Kennieth Rad, ACNP [facility] 04/03/2021, 3:02 PM  See Amion for pager If no response to pager, please call PCCM consult pager After 7:00 pm call Elink

## 2021-04-03 NOTE — Progress Notes (Addendum)
Subjective: No further clinical seizures.  ROS: Unable to obtain due to poor mental status  Examination  Vital signs in last 24 hours: Temp:  [97.3 F (36.3 C)-100 F (37.8 C)] 97.3 F (36.3 C) (11/16 0800) Pulse Rate:  [94-147] 147 (11/16 0900) Resp:  [16-35] 24 (11/16 0900) BP: (84-121)/(55-88) 110/67 (11/16 0900) SpO2:  [72 %-100 %] 86 % (11/16 0900) FiO2 (%):  [40 %] 40 % (11/16 0800) Weight:  [56 kg] 56 kg (11/16 0500)  General: lying in bed, NAD CVS: pulse-normal rate and rhythm RS: intubated, coarse birth sounds BL Extremities: warm, no edema Neuro: off propofol, opens eyes and nods appropriately, PERLA, EOMI, withdraws to noxious stimuli in all extremities with antigravity strength in BL LE  Basic Metabolic Panel: Recent Labs  Lab 04/01/21 1859 04/01/21 1936 04/02/21 0253 04/02/21 0344 04/02/21 0503 04/02/21 0819 04/02/21 1412 04/03/21 0344  NA 137 137  136 136 138 133* 138  --  137  K 3.7 3.6  3.5 3.9 3.5 3.8 4.1  --  3.7  CL 105 105 104  --  100  --   --  106  CO2 19*  --  21*  --  23  --   --  23  GLUCOSE 90 93 113*  --  129*  --   --  128*  BUN 5* 3* 6*  --  5*  --   --  9  CREATININE 0.76 0.70 0.83  --  0.76  --   --  0.79  CALCIUM 9.4  --  9.1  --  8.9  --   --  8.5*  MG 1.5*  --   --   --   --   --  1.8 1.8  PHOS  --   --   --   --   --   --  3.4 3.1    CBC: Recent Labs  Lab 04/01/21 1859 04/01/21 1936 04/02/21 0503 04/02/21 0819 04/02/21 1412 04/02/21 2000 04/03/21 0344  WBC 5.6  --  10.8*  --   --   --  9.5  NEUTROABS 3.8  --  9.9*  --   --   --   --   HGB 11.9*   < > 12.1* 12.9* 10.6* 9.8* 9.7*  HCT 37.8*   < > 38.2* 38.0* 33.5* 30.4* 30.8*  MCV 84.6  --  85.7  --   --   --  85.3  PLT 238  --  256  --   --   --  229   < > = values in this interval not displayed.     Coagulation Studies: Recent Labs    04/01/21 2000 04/02/21 0503  LABPROT 13.5 13.3  INR 1.0 1.0    Imaging  MR Brain w and wo contrast 04/01/2021:  1.  Technically limited exam due to extensive motion artifact. 2. 1.4 cm metastasis involving the parasagittal left parietal lobe. Surrounding vasogenic edema without midline shift. 3. Diffuse dural thickening and enhancement about the adjacent craniotomy site, favored to be postoperative in nature, although attention at follow-up is recommended. 4. Question subtly increased diffusion signal abnormality at the left thalamus. Although this finding is not entirely certain given motion artifact on this exam, possible subtle changes of ischemia or possibly seizure could be considered. Additional mildly increased diffusion signal within the adjacent left parieto-occipital region could also reflect changes of acute seizure. Correlation with EEG recommended. 5. No other acute intracranial abnormality.    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  Focal nonconvulsive status epilepticus (resolved) -LTM EEG showed evidence of epileptogenicity and cortical dysfunction arising from left hemisphere, maximal left posterior temporal region which is on the ictal-interictal continuum with high potential for seizure recurrence.  Additionally there is moderate diffuse encephalopathy, nonspecific etiology.  No definite seizures were seen.  Recommendation -Will wean propofol at 5 MCG per hour to stop -Continue Keppra 1000 mg daily (was on 500 mg twice daily at home) -If any further seizures, can load with Vimpat -Continue LTM till patient is seizure-free for about 24 hours after weaning propofol -Continue seizure precautions -Management of rest of comorbidities per primary team - Called and updated POA [employer].  CRITICAL CARE Performed by: Lora Havens   Total critical care time: 35 minutes  Critical care time was exclusive of separately billable procedures and treating other patients.  Critical care was necessary  to treat or prevent imminent or life-threatening deterioration.  Critical care was time spent personally by me on the following activities: development of treatment plan with patient and/or surrogate as well as nursing, discussions with consultants, evaluation of patient's response to treatment, examination of patient, obtaining history from patient or surrogate, ordering and performing treatments and interventions, ordering and review of laboratory studies, ordering and review of radiographic studies, pulse oximetry and re-evaluation of patient's condition.    Zeb Comfort Epilepsy [facility] For questions after 5pm please refer to AMION to reach the Neurologist on call

## 2021-04-03 NOTE — Progress Notes (Signed)
   04/03/21 0750  Airway 7.5 mm  Placement Date/Time: 04/02/21 (c) 0218   Difficult airway due to:: Difficulty was anticipated;Difficult airway - due to immobile epiglottis;Difficult airway - due to anterior larynx;Difficult airway - due to limited oral opening  Grade View: Grade 3  ...  Secured at (cm) 25 cm  Measured From Lips  Secured Location Left  Secured By Actuary Repositioned Yes  Prone position No  Cuff Pressure (cm H2O) Green OR 18-26 CmH2O  Site Condition Dry  Adult Ventilator Settings  Vent Type Servo i  Humidity HME  Vent Mode PRVC  Vt Set 400 mL  Set Rate 18 bmp  FiO2 (%) 40 %  I Time 0.9 Sec(s)  PEEP 5 cmH20  Adult Ventilator Measurements  Peak Airway Pressure 15 L/min  Mean Airway Pressure 8 cmH20  Plateau Pressure 12 cmH20  Resp Rate Spontaneous 0 br/min  Resp Rate Total 18 br/min  Exhaled Vt 413 mL  I:E Ratio Measured 1:2.7  Auto PEEP 0 cmH20  Total PEEP 5 cmH20  SpO2 100 %  Adult Ventilator Alarms  Alarms On Y  Ve High Alarm 21 L/min  Ve Low Alarm 4 L/min  Resp Rate High Alarm 38 br/min  Resp Rate Low Alarm 10  PEEP Low Alarm 3 cmH2O  Press High Alarm 40 cmH2O  VAP Prevention  HME changed No  Ventilator changed No  Transported while on vent No  HOB> 30 Degrees Y  Equipment wiped down Yes  Daily Weaning Assessment  Daily Assessment of Readiness to Wean Wean protocol criteria not met (EEG)  Breath Sounds  Bilateral Breath Sounds Diminished;Rhonchi  Vent Respiratory Assessment  Respiratory Pattern Regular;Unlabored  Airway Suctioning/Secretions  Suction Type ETT  Suction Device  Catheter  Secretion Amount Moderate  Secretion Color Yellow;White  Secretion Consistency Thick  Suction Tolerance Tolerated well  Suctioning Adverse Effects None

## 2021-04-03 NOTE — Progress Notes (Signed)
Maint complete. 

## 2021-04-03 NOTE — Procedures (Addendum)
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 EEG to evaluate for seizure  Level of alertness:  lethargic   AEDs during EEG study: LEV  Technical aspects: This EEG study was done with scalp electrodes positioned according to the 10-20 International system of electrode placement. Electrical activity was acquired at a sampling rate of 500Hz  and reviewed with a high frequency filter of 70Hz  and a low frequency filter of 1Hz . EEG data were recorded continuously and digitally stored.   Description: EEG showed continuous generalized and maximal left posterior temporal region 3 to 6 Hz theta-delta slowing with overriding 15 to 18 Hz generalized beta activity. Lateralized paretic discharges with overriding fast activity were also noted in left posterior temporal region with fluctuating frequency between 0.5 to 1 Hz which at times appeared rhythmic without definite evolution consistent with brief ictal-interictal rhythmic discharges.  Hyperventilation and photic stimulation were not performed.     ABNORMALITY -Periodic discharges with overriding fast activity, left hemisphere, maximal left posterior temporal region ( LPD+) -Brief ictal-interictal rhythmic discharges, left hemisphere, maximal left posterior temporal region -Continuous slow, generalized and maximal left posterior temporal region  IMPRESSION: This study showed evidence of epileptogenicity and cortical dysfunction arising from left hemisphere, maximal left posterior temporal region which is on the ictal-interictal continuum with high potential for seizure recurrence.  Additionally there is moderate diffuse encephalopathy, nonspecific etiology.  No definite seizures were seen.  Rashawna Scoles Barbra Sarks

## 2021-04-03 NOTE — Progress Notes (Signed)
[facility] ADULT ICU REPLACEMENT PROTOCOL   The patient does apply for the [facility] Adult ICU Electrolyte Replacment Protocol based on the criteria listed below:   1.Exclusion criteria: TCTS patients, ECMO patients, and Dialysis patients 2. Is GFR >/= 30 ml/min? Yes.    Patient's GFR today is >60  3. Is SCr </= 2? Yes.   Patient's SCr is 0.79 mg/dL 4. Did SCr increase >/= 0.5 in 24 hours? No. 5.Pt's weight >40kg  Yes.   6. Abnormal electrolyte(s): K+ 3.7, mag 1.8  7. Electrolytes replaced per protocol 8.  Call MD STAT for K+ </= 2.5, Phos </= 1, or Mag </= 1 Physician:  n/a  Darlys Gales 04/03/2021 5:16 AM

## 2021-04-03 NOTE — Progress Notes (Signed)
NAME:  [name] [name], MRN:  [id number], DOB:  Apr 13, 1956, LOS: 1 ADMISSION DATE:  04/01/2021, CONSULTATION DATE:  04/02/21 REFERRING MD:  Alcario Drought, CHIEF COMPLAINT:  seizures  History of Present Illness:  65 yo man with hx COPD, metastatic nsclc treated with chemo and immunotherapy, hx of crani, HTN, of R sided deficits from hx of brain tumor,  Brought to ED after being found outdoors  confused and aphasic by neighbors, presented with tremors, aphasia.   Seen by home health in am, acting somewhat confused but not aphasic.  Normal at 5 pm 11/13 pm per family.    On admit to ED, tachycardia tachypnea.  Started on vanc and cefepime and flagyl Ativan1mg  x 1,  2L LR, and  2 spells of RLE jerking in CT.  Neurology thought findings 2/2 partial complex seizure vs stroke.  Stereotatereotactic radiosurgery to brain x6 days ago.  Radiosurgery to brain x6 days ago.  Vomited while altered, became hypoxic, repeated emesis, was intubated by EDP.   Pertinent  Medical History  Emphysema  NSCLC, metastatic  HTN Iron deficiency anemia Seizure hx, brain mets ( on keppra at home) Tracheal mass   Significant Hospital Events: Including procedures, antibiotic start and stop dates in addition to other pertinent events   Admitted 11/14 with seizure,MRI-> metastatis parasagittal left parietal lobe with surrounding vasogenic edema without MLS, decadron started; acute respiratory insufficiency, vomited-> hypoxic, intubated, cefepime/ vanc 11/15 changed to cEEG, not f/c, gross hematuria-> urology consulted, catheter changes/ bladder irrigated  Interim History / Subjective:  Hgb down trend yesterday 12.1 > 10.6 > 9.8 since stable today at 9.7 Urinary foley changed to 3way per Urology last evening, large clots but no active bleeding.  Since urine is now clear yellow. Overnight had periods of agitation trying to sit up, not f/c.  Has copious oral and ETT secretions when awake and agitated.  Remains on low dose  propofol and cEEG BP soft at times with sedation Afebrile UOP 1.4L/ 24 hrs  POA Peggy at bedside today  Objective   Blood pressure 110/67, pulse (!) 147, temperature (!) 97.3 F (36.3 C), temperature source Axillary, resp. rate (!) 24, height 5' (1.524 m), weight 56 kg, SpO2 (!) 86 %.    Vent Mode: PRVC FiO2 (%):  [40 %] 40 % Set Rate:  [18 bmp] 18 bmp Vt Set:  [400 mL] 400 mL PEEP:  [5 cmH20] 5 cmH20 Plateau Pressure:  [8 [id number] cmH20] 12 cmH20   Intake/Output Summary (Last 24 hours) at 04/03/2021 1013 Last data filed at 04/03/2021 0800 Gross per 24 hour  Intake 3469.06 ml  Output 1650 ml  Net 1819.06 ml   Filed Weights   04/03/21 0500  Weight: 56 kg   Examination: General:  chronically ill, older male lying in bed in NAD, intermittently restless on propofol at 10 HEENT: MM pink/moist, ETT/ OGT, pupils 4/reactive, cEEG ongoing Neuro: eyes open but doesn't track or follow commands, MAE, localizes at time, then not at others- remains in soft wrist restraints, when agitated, noted tremor in RUE and RLE CV: ST, no murmur, port in right chest accessed PULM:  frequent coughing at times, moderate white secretions GI: soft, bs+ , large foley- cyu Extremities: warm/dry, no LE edema  Skin: no rashes   11/14 SARs/ flu > neg 11/15 MRSA pcr > neg 11/14 Bcx2 > 11/14 UC >  11/14 vanc > 11/16 11/14 cefepime > 11/16 11/14 flagyl 11/16 ceftriaxone   CT head 10/26: IMPRESSION: New 1.1 cm metastasis  of the parasagittal left parietal lobe with mild edema. No evidence of recurrent disease at the operative site.  CXR: gastric tube needs advancing, ET tube fine, left lower lobe lesion, no new consolidation or abnormality   MRI  IMPRESSION: 1. Technically limited exam due to extensive motion artifact. 2. 1.4 cm metastasis involving the parasagittal left parietal lobe. Surrounding vasogenic edema without midline shift. 3. Diffuse dural thickening and enhancement about the  adjacent craniotomy site, favored to be postoperative in nature, although attention at follow-up is recommended. 4. Question subtly increased diffusion signal abnormality at the left thalamus. Although this finding is not entirely certain given motion artifact on this exam, possible subtle changes of ischemia or possibly seizure could be considered. Additional mildly increased diffusion signal within the adjacent left parieto-occipital region could also reflect changes of acute seizure. Correlation with EEG recommended. 5. No other acute intracranial abnormality  11/15 CT abd/ pelvis >> 1. Negative for pneumoperitoneum or bowel abnormality. 2. But positive for asymmetric delayed nephrograms (worse on the left) with abundant abnormal pararenal space stranding and/or fluid.  But no hydronephrosis or obstructing lesion. Bladder decompressed by Foley catheter, although with intermediate density filling defect in the bladder lumen. Constellation suggests Acute Intrinsic Renal Failure. Query hematuria. Urinalysis recommended. 3. Positive also for patchy opacity in the lateral basal segment of the right lower lobe suspicious for Acute Bronchopneumonia. And trace pleural effusions are new since July. 4. And also new small volume pericardial effusion with possible complex fluid density. Query Pericarditis. 5. Enteric tube terminates in the gastric body  11/15 TTE >> Moderate pericardial effusion that is apical and anterior to the RV.  Largest at 17 mm. There is no RV or RA collapse.  LVEF 60%, no RWA, indeterminate diastolic filling, normal RV function  Resolved Hospital Problem list    Assessment & Plan:   Seizures  Brain metastasis s/p crani 08/2020 with SRS/ chemo now with vasogenic edema on MRI, s/p first [place] 11/8 P: - Neurology following, appreciate input - Ongoing cEEG, versed stopped 11/15 - Serial neuro exams  - Minimizing sedation as able - AEDs per Neurology -> ongoing vimpat/  keppra  - Prn ativan for seizures - Continue decadron 4mg  q 6hrs, will discuss with neurology what the length of steroids should be - Cultures negative to date.  Remains afebrile, continue abx as below - Trend wBC/fever curve    Respiratory insufficiency related to above with vomiting episodes R/o aspiration RLL on CT, CXR remains without focal infiltrate Metastatic NSCLC -currently on chemo/ SRS, immunocompromised  - last chemo 10/17 - Alimta and Keytruda (q 3weeks) Hx COPD P:  - Continue full MV support, daily SBT trials hopeful to start today - Intermittent CXR - D/c vanc, de-esclate cefepime to ceftriaxone  - Follow trach aspirate - VAP/ PPI  - Continue duonebs and pulmicort - PAD protocol, will d/c propofol and switch to precedex if needed    Hematuria Anemia  - with abnormal renal imaging on CT abd/ pelvis w/o -> asymmetric delayed nephrograms with renal stranding, no hydronephrosis or obvious obstructing lesion, and bladder was decompressed with Foley catheter with intermediate density filling defect in the bladder P: - Coags wnl - Urology consulted 11/15, foley exchanged for 3 way and irrigated until clear, large clots noted, no evidence of active bleeding.  Will need further outpatient urology f/u for cystoscopy  - Renal function has remained stable - Will d/c MIVF now UOP is cleared  - Hgb since stabilized 9.8 >  9.7, monitor for further bleeding  - UA with pyuria, follow UC   Pericardial effusion - moderate pericardial effusion that is apical and anterior to the RV, no RV/ LV compromise.  - no signs of tamponade  - will need further outpatient f/u    Mild coffee ground emesis - on admit, cleared - continue PPI BID for now, while on steroids - consider further outpt workup, CT abd/ pelvis without abnormality/ inflammatory changes - drift in Hgb attributed to hematuria    Best Practice (right click and "Reselect all SmartList Selections" daily)   Diet/type:  tubefeeds  DVT prophylaxis: other held given hematuria.  If remains stable can start back VTE ppx 11/17 GI prophylaxis: PPI Lines: N/A has port right chest Foley:  Yes, and it is still needed Code Status:  full code Last date of multidisciplinary goals of care discussion []  POA Peggy at bedside and updated.  She will be leaving but asked if neurology could call and provide update.  Patient does have siblings, but they [place] of state.  Labs   CBC: Recent Labs  Lab 04/01/21 1859 04/01/21 1936 04/02/21 0503 04/02/21 0819 04/02/21 1412 04/02/21 2000 04/03/21 0344  WBC 5.6  --  10.8*  --   --   --  9.5  NEUTROABS 3.8  --  9.9*  --   --   --   --   HGB 11.9*   < > 12.1* 12.9* 10.6* 9.8* 9.7*  HCT 37.8*   < > 38.2* 38.0* 33.5* 30.4* 30.8*  MCV 84.6  --  85.7  --   --   --  85.3  PLT 238  --  256  --   --   --  229   < > = values in this interval not displayed.    Basic Metabolic Panel: Recent Labs  Lab 04/01/21 1859 04/01/21 1936 04/02/21 0253 04/02/21 0344 04/02/21 0503 04/02/21 0819 04/02/21 1412 04/03/21 0344  NA 137 137  136 136 138 133* 138  --  137  K 3.7 3.6  3.5 3.9 3.5 3.8 4.1  --  3.7  CL 105 105 104  --  100  --   --  106  CO2 19*  --  21*  --  23  --   --  23  GLUCOSE 90 93 113*  --  129*  --   --  128*  BUN 5* 3* 6*  --  5*  --   --  9  CREATININE 0.76 0.70 0.83  --  0.76  --   --  0.79  CALCIUM 9.4  --  9.1  --  8.9  --   --  8.5*  MG 1.5*  --   --   --   --   --  1.8 1.8  PHOS  --   --   --   --   --   --  3.4 3.1   GFR: Estimated Creatinine Clearance: 66 mL/min (by C-G formula based on SCr of 0.79 mg/dL). Recent Labs  Lab 04/01/21 1859 04/01/21 1905 04/01/21 2105 04/02/21 0253 04/02/21 0503 04/03/21 0344  PROCALCITON  --   --   --  0.15  --   --   WBC 5.6  --   --   --  10.8* 9.5  LATICACIDVEN  --  2.2* 1.9  --  1.1  --     Liver Function Tests: Recent Labs  Lab 04/01/21 1859 04/03/21 0344  AST  27  --   ALT 12  --   ALKPHOS 53  --    BILITOT 0.4  --   PROT 6.7  --   ALBUMIN 3.4* 2.6*   No results for input(s): LIPASE, AMYLASE in the last 168 hours. Recent Labs  Lab 04/01/21 1859  AMMONIA 26    ABG    Component Value Date/Time   PHART 7.391 04/02/2021 0819   PCO2ART 41.9 04/02/2021 0819   PO2ART 136 (H) 04/02/2021 0819   HCO3 25.3 04/02/2021 0819   TCO2 27 04/02/2021 0819   ACIDBASEDEF 2.0 04/02/2021 0344   O2SAT 99.0 04/02/2021 0819     Coagulation Profile: Recent Labs  Lab 04/01/21 2000 04/02/21 0503  INR 1.0 1.0    Cardiac Enzymes: Recent Labs  Lab 04/02/21 0503  CKTOTAL 178    HbA1C: Hemoglobin A1C  Date/Time Value Ref Range Status  05/31/2018 04:54 PM 5.6 4.0 - 5.6 % Final   Hgb A1c MFr Bld  Date/Time Value Ref Range Status  05/29/2008 09:43 PM  4.6 - 6.1 % Final   5.7 (NOTE)   The ADA recommends the following therapeutic goal for glycemic   control related to Hgb A1C measurement:   Goal of Therapy:   < 7.0% Hgb A1C   Reference: American Diabetes Association: Clinical Practice   Recommendations 2008, Diabetes Care,  2008, 31:(Suppl 1).    CBG: Recent Labs  Lab 04/02/21 1527 04/02/21 1926 04/02/21 2316 04/03/21 0309 04/03/21 0751  GLUCAP 138* 151* 153* 131* 145*    Critical care time: 35 min      Kennieth Rad, ACNP [facility] 04/03/2021, 10:13 AM  See Amion for pager If no response to pager, please call PCCM consult pager After 7:00 pm call Elink

## 2021-04-03 NOTE — Procedures (Signed)
Extubation Procedure Note  Patient Details:   Name: [name] [name] DOB: 27-Feb-1956 MRN: [id number]   Airway Documentation:    Vent end date: 04/03/21 Vent end time: 1457   Evaluation  O2 sats: stable throughout Complications: No apparent complications Patient did tolerate procedure well. Bilateral Breath Sounds: Rhonchi, Diminished  Pt able to speak: Yes  Rudene Re 04/03/2021, 2:59 PM

## 2021-04-04 DIAGNOSIS — G9340 Encephalopathy, unspecified: Secondary | ICD-10-CM | POA: Diagnosis not present

## 2021-04-04 DIAGNOSIS — R569 Unspecified convulsions: Secondary | ICD-10-CM | POA: Diagnosis not present

## 2021-04-04 LAB — CBC
HCT: 32.5 % — ABNORMAL LOW (ref 39.0–52.0)
Hemoglobin: 10.2 g/dL — ABNORMAL LOW (ref 13.0–17.0)
MCH: 26.4 pg (ref 26.0–34.0)
MCHC: 31.4 g/dL (ref 30.0–36.0)
MCV: 84 fL (ref 80.0–100.0)
Platelets: 268 10*3/uL (ref 150–400)
RBC: 3.87 MIL/uL — ABNORMAL LOW (ref 4.22–5.81)
RDW: 16.1 % — ABNORMAL HIGH (ref 11.5–15.5)
WBC: 10.2 10*3/uL (ref 4.0–10.5)
nRBC: 0 % (ref 0.0–0.2)

## 2021-04-04 LAB — RENAL FUNCTION PANEL
Albumin: 2.8 g/dL — ABNORMAL LOW (ref 3.5–5.0)
Anion gap: 6 (ref 5–15)
BUN: 7 mg/dL — ABNORMAL LOW (ref 8–23)
CO2: 27 mmol/L (ref 22–32)
Calcium: 9.2 mg/dL (ref 8.9–10.3)
Chloride: 105 mmol/L (ref 98–111)
Creatinine, Ser: 0.66 mg/dL (ref 0.61–1.24)
GFR, Estimated: >60 mL/min (ref >60)
Glucose, Bld: 91 mg/dL (ref 70–99)
Phosphorus: 2.4 mg/dL — ABNORMAL LOW (ref 2.5–4.6)
Potassium: 4 mmol/L (ref 3.5–5.1)
Sodium: 138 mmol/L (ref 135–145)

## 2021-04-04 LAB — GLUCOSE, CAPILLARY
Glucose-Capillary: 99 mg/dL (ref 70–99)
Glucose-Capillary: 97 mg/dL (ref 70–99)
Glucose-Capillary: 85 mg/dL (ref 70–99)
Glucose-Capillary: 78 mg/dL (ref 70–99)
Glucose-Capillary: 118 mg/dL — ABNORMAL HIGH (ref 70–99)
Glucose-Capillary: 113 mg/dL — ABNORMAL HIGH (ref 70–99)

## 2021-04-04 MED ORDER — ENSURE ENLIVE PO LIQD
237.0000 mL | Freq: Two times a day (BID) | ORAL | Status: DC
  Administered 2021-04-04 – 2021-04-15 (×16): 237 mL via ORAL
  Filled 2021-04-04: qty 237

## 2021-04-04 MED ORDER — CHLORHEXIDINE GLUCONATE 0.12 % MT SOLN
15.0000 mL | Freq: Two times a day (BID) | OROMUCOSAL | Status: DC
  Administered 2021-04-04 – 2021-04-15 (×21): 15 mL via OROMUCOSAL
  Filled 2021-04-04 (×21): qty 15

## 2021-04-04 MED ORDER — ENOXAPARIN SODIUM 40 MG/0.4ML IJ SOSY
40.0000 mg | PREFILLED_SYRINGE | INTRAMUSCULAR | Status: DC
  Administered 2021-04-04 – 2021-04-15 (×12): 40 mg via SUBCUTANEOUS
  Filled 2021-04-04 (×12): qty 0.4

## 2021-04-04 MED ORDER — ORAL CARE MOUTH RINSE
15.0000 mL | Freq: Two times a day (BID) | OROMUCOSAL | Status: DC
  Administered 2021-04-04 – 2021-04-15 (×17): 15 mL via OROMUCOSAL

## 2021-04-04 MED ORDER — IPRATROPIUM-ALBUTEROL 0.5-2.5 (3) MG/3ML IN SOLN
3.0000 mL | Freq: Four times a day (QID) | RESPIRATORY_TRACT | Status: DC
Start: 2021-04-04 — End: 2021-04-06
  Administered 2021-04-04 – 2021-04-06 (×9): 3 mL via RESPIRATORY_TRACT
  Filled 2021-04-04 (×9): qty 3

## 2021-04-04 NOTE — Progress Notes (Signed)
Subjective: No further clinical seizures overnight.  Patient was extubated.  ROS: negative except above  Examination  Vital signs in last 24 hours: Temp:  [97.3 F (36.3 C)-99.3 F (37.4 C)] 97.7 F (36.5 C) (11/17 0700) Pulse Rate:  [54-120] 90 (11/17 0900) Resp:  [18-35] 28 (11/17 0900) BP: (69-158)/(44-99) 148/81 (11/17 0900) SpO2:  [97 %-100 %] 100 % (11/17 0900) FiO2 (%):  [40 %] 40 % (11/16 1304) Weight:  [56.8 kg] 56.8 kg (11/17 0500)  General: lying in bed, NAD CVS: pulse-normal rate and rhythm RS: CTAB, on  Extremities: warm, no edema Neuro: Awake, alert, oriented to place and person, not to time, cranial nerves II to XII appear grossly intact, antigravity strength in all 4 extremities  Basic Metabolic Panel: Recent Labs  Lab 04/01/21 1859 04/01/21 1936 04/02/21 0253 04/02/21 0344 04/02/21 0503 04/02/21 0819 04/02/21 1412 04/03/21 0344 04/04/21 0515  NA 137 137  136 136 138 133* 138  --  137 138  K 3.7 3.6  3.5 3.9 3.5 3.8 4.1  --  3.7 4.0  CL 105 105 104  --  100  --   --  106 105  CO2 19*  --  21*  --  23  --   --  23 27  GLUCOSE 90 93 113*  --  129*  --   --  128* 91  BUN 5* 3* 6*  --  5*  --   --  9 7*  CREATININE 0.76 0.70 0.83  --  0.76  --   --  0.79 0.66  CALCIUM 9.4  --  9.1  --  8.9  --   --  8.5* 9.2  MG 1.5*  --   --   --   --   --  1.8 1.8  --   PHOS  --   --   --   --   --   --  3.4 3.1 2.4*    CBC: Recent Labs  Lab 04/01/21 1859 04/01/21 1936 04/02/21 0503 04/02/21 0819 04/02/21 1412 04/02/21 2000 04/03/21 0344 04/04/21 0830  WBC 5.6  --  10.8*  --   --   --  9.5 10.2  NEUTROABS 3.8  --  9.9*  --   --   --   --   --   HGB 11.9*   < > 12.1* 12.9* 10.6* 9.8* 9.7* 10.2*  HCT 37.8*   < > 38.2* 38.0* 33.5* 30.4* 30.8* 32.5*  MCV 84.6  --  85.7  --   --   --  85.3 84.0  PLT 238  --  256  --   --   --  229 268   < > = values in this interval not displayed.     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   Focal nonconvulsive status epilepticus (resolved) -LTM EEG showed evidence of epileptogenicity and cortical dysfunction arising from left hemisphere, maximal left posterior temporal region which is on the ictal-interictal continuum with high potential for seizure recurrence.  Additionally there is moderate diffuse encephalopathy, nonspecific etiology.  No definite seizures were seen.   Recommendation -Continue Keppra 1000 mg daily (was on 500 mg twice daily at home) -If any further seizures,  can load with Vimpat -Discontinue LTM EEG as patient remains seizure-free -Continue seizure precautions -Management of rest of comorbidities per primary team -Follow-up with neurology in 6 to 8 weeks after discharge.  Neurology will follow peripherally.  Please call us for any further questions.  Seizure precautions: Per [facility] statutes, patients with seizures are not allowed to drive until they have been seizure-free for six months and cleared by a physician    Use caution when using heavy equipment or power tools. Avoid working on ladders or at heights. Take showers instead of baths. Ensure the water temperature is not too high on the home water heater. Do not go swimming alone. Do not lock yourself in a room alone (i.e. bathroom). When caring for infants or small children, sit down when holding, feeding, or changing them to minimize risk of injury to the child in the event you have a seizure. Maintain good sleep hygiene. Avoid alcohol.    If patient has another seizure, call 911 and bring them back to the ED if: A.  The seizure lasts longer than 5 minutes.      B.  The patient doesn't wake shortly after the seizure or has new problems such as difficulty seeing, speaking or  moving following the seizure C.  The patient was injured during the seizure D.  The patient has a temperature over 102 F (39C) E.  The patient vomited during the seizure and now is having trouble breathing    During the Seizure   - First, ensure adequate ventilation and place patients on the floor on their left side  Loosen clothing around the neck and ensure the airway is patent. If the patient is clenching the teeth, do not force the mouth open with any object as this can cause severe damage - Remove all items from the surrounding that can be hazardous. The patient may be oblivious to what's happening and may not even know what he or she is doing. If the patient is confused and wandering, either gently guide him/her away and block access to outside areas - Reassure the individual and be comforting - Call 911. In most cases, the seizure ends before EMS arrives. However, there are cases when seizures may last over 3 to 5 minutes. Or the individual may have developed breathing difficulties or severe injuries. If a pregnant patient or a person with diabetes develops a seizure, it is prudent to call an ambulance. - Finally, if the patient does not regain full consciousness, then call EMS. Most patients will remain confused for about 45 to 90 minutes after a seizure, so you must use judgment in calling for help. - Avoid restraints but make sure the patient is in a bed with padded side rails - Place the individual in a lateral position with the neck slightly flexed; this will help the saliva drain from the mouth and prevent the tongue from falling backward - Remove all nearby furniture and other hazards from the area - Provide verbal assurance as the individual is regaining consciousness - Provide the patient with privacy if possible - Call for help and start treatment as ordered by the caregiver    After the Seizure (Postictal Stage)   After a seizure, most patients experience confusion, fatigue,  muscle pain and/or a headache. Thus, one should permit the individual to sleep. For the next few days, reassurance is essential. Being calm and helping reorient the person is also of importance.   Most seizures are painless and  end spontaneously. Seizures are not harmful to others but can lead to complications such as stress on the lungs, brain and the heart. Individuals with prior lung problems may develop labored breathing and respiratory distress.   I have spent a total of  26 minutes with the patient reviewing hospital notes,  test results, labs and examining the patient as well as establishing an assessment and plan.  > 50% of time was spent in direct patient care.  Zeb Comfort Epilepsy [facility] For questions after 5pm please refer to AMION to reach the Neurologist on call

## 2021-04-04 NOTE — Evaluation (Signed)
Physical Therapy Evaluation Patient Details Name: [name] [name] MRN: [id number] DOB: [birth year] Today's Date: 04/04/2021  History of Present Illness  65 y/o male presented to ED on 11/14 after being found outside by neighbor acting abnormally and confusion. Recently underwent stereotactic radiosurgery to brain x 6 days ago. CT head showed hypodensity in L parietal and occipital lobes. MRI showed 1.4 cm metastsis involving parasagittal L parietal lobe. Seizure activity in ED. No seizures noted on EEG. Intubated 11/15-11/16. PMH: HTN, anemia, bullous emphysema, COPD, and non-small cell lung cancer status post chemoradiation and now on immunotherapy, hx of crani and R sided deficits from hx of brain tumor.  Clinical Impression  Patient admitted with above diagnosis. Patient presents with bilateral LE weakness, impaired balance, decreased activity tolerance, and impaired cognition. Patient unreliable historian and A&Ox1 this session. Patient demos deficits in attention, command following, problem solving, and awareness. Patient requires maxA+2 for bed mobility and sit stand transfer. Patient will benefit from skilled PT services during acute stay to address listed deficits. Recommend SNF at discharge to maximize functional mobility and safety.        Recommendations for follow up therapy are one component of a multi-disciplinary discharge planning process, led by the attending physician.  Recommendations may be updated based on patient status, additional functional criteria and insurance authorization.  Follow Up Recommendations Skilled nursing-short term rehab (<3 hours/day)    Assistance Recommended at Discharge Frequent or constant Supervision/Assistance  Functional Status Assessment Patient has had a recent decline in their functional status and/or demonstrates limited ability to make significant improvements in function in a reasonable and predictable amount of time  Equipment Recommendations   Other (comment) (TBD)    Recommendations for Other Services       Precautions / Restrictions Precautions Precautions: Fall Precaution Comments: restraints, mittens Restrictions Weight Bearing Restrictions: No      Mobility  Bed Mobility Overal bed mobility: Needs Assistance Bed Mobility: Supine to Sit;Sit to Supine     Supine to sit: Max assist;+2 for physical assistance;+2 for safety/equipment Sit to supine: Total assist;+2 for physical assistance;+2 for safety/equipment   General bed mobility comments: maxA+2 for initiation towards EOB, bringing LEs off and trunk elevation. TotalA+2 to return to supine    Transfers Overall transfer level: Needs assistance Equipment used: 2 person hand held assist Transfers: Sit to/from Stand Sit to Stand: Max assist;+2 physical assistance;+2 safety/equipment           General transfer comment: maxA+2 to stand from EOB. Unable to achieve full upright standing    Ambulation/Gait                  Stairs            Wheelchair Mobility    Modified Rankin (Stroke Patients Only)       Balance Overall balance assessment: Needs assistance Sitting-balance support: No upper extremity supported;Feet supported Sitting balance-Leahy Scale: Poor     Standing balance support: Bilateral upper extremity supported Standing balance-Leahy Scale: Zero                               Pertinent Vitals/Pain Pain Assessment: Faces Faces Pain Scale: Hurts a little bit Facial Expression: Relaxed, neutral Body Movements: Absence of movements Muscle Tension: Relaxed Compliance with ventilator (intubated pts.): N/A Vocalization (extubated pts.): Talking in normal tone or no sound CPOT Total: 0 Pain Location: foley Pain Descriptors / Indicators: Grimacing Pain Intervention(s): Monitored during  session;Repositioned    Home Living Family/patient expects to be discharged to:: Private residence Living Arrangements:  Alone   Type of Home: Apartment Home Access: Level entry       Home Layout: One level Home Equipment: Grab bars - tub/shower Additional Comments: some home info obtained  from chart review from admission 08/2020    Prior Function Prior Level of Function : Independent/Modified Independent             Mobility Comments: assume independence but patient poor historian       Hand Dominance        Extremity/Trunk Assessment   Upper Extremity Assessment Upper Extremity Assessment: Defer to OT evaluation    Lower Extremity Assessment Lower Extremity Assessment: Difficult to assess due to impaired cognition;Generalized weakness    Cervical / Trunk Assessment Cervical / Trunk Assessment: Kyphotic  Communication   Communication: No difficulties  Cognition Arousal/Alertness: Awake/alert Behavior During Therapy: Flat affect Overall Cognitive Status: No family/caregiver present to determine baseline cognitive functioning Area of Impairment: Orientation;Attention;Following commands;Safety/judgement;Awareness;Problem solving                 Orientation Level: Disoriented to;Place;Time;Situation Current Attention Level: Focused   Following Commands: Follows one step commands inconsistently Safety/Judgement: Decreased awareness of safety;Decreased awareness of deficits Awareness: Intellectual Problem Solving: Slow processing;Decreased initiation;Requires verbal cues;Requires tactile cues;Difficulty sequencing General Comments: A&Ox1. Following commands intermittently ~25% of the time. Requires max cueing for sequencing Functional Status Assessment: Patient has had a recent decline in their functional status and/or demonstrates limited ability to make significant improvements in function in a reasonable and predictable amount of time      General Comments      Exercises     Assessment/Plan    PT Assessment Patient needs continued PT services  PT Problem List  Decreased strength;Decreased activity tolerance;Decreased mobility;Decreased coordination;Decreased balance;Decreased cognition;Decreased knowledge of use of DME;Decreased safety awareness;Decreased knowledge of precautions;Cardiopulmonary status limiting activity       PT Treatment Interventions DME instruction;Gait training;Functional mobility training;Therapeutic activities;Therapeutic exercise;Balance training;Patient/family education    PT Goals (Current goals can be found in the Care Plan section)  Acute Rehab PT Goals Patient Stated Goal: did not state PT Goal Formulation: Patient unable to participate in goal setting Time For Goal Achievement: 04/18/21 Potential to Achieve Goals: Fair    Frequency Min 2X/week   Barriers to discharge        Co-evaluation PT/OT/SLP Co-Evaluation/Treatment: Yes Reason for Co-Treatment: Necessary to address cognition/behavior during functional activity;For patient/therapist safety;To address functional/ADL transfers PT goals addressed during session: Mobility/safety with mobility;Balance         AM-PAC PT "6 Clicks" Mobility  Outcome Measure Help needed turning from your back to your side while in a flat bed without using bedrails?: Total Help needed moving from lying on your back to sitting on the side of a flat bed without using bedrails?: Total Help needed moving to and from a bed to a chair (including a wheelchair)?: Total Help needed standing up from a chair using your arms (e.g., wheelchair or bedside chair)?: Total Help needed to walk in hospital room?: Total Help needed climbing 3-5 steps with a railing? : Total 6 Click Score: 6    End of Session Equipment Utilized During Treatment: Gait belt;Oxygen Activity Tolerance: Patient tolerated treatment well Patient left: in bed;with call bell/phone within reach;with bed alarm set;with restraints reapplied Nurse Communication: Mobility status PT Visit Diagnosis: Unsteadiness on feet  (R26.81);Muscle weakness (generalized) (M62.81);Difficulty in walking, not elsewhere  classified (R26.2);Other symptoms and signs involving the nervous system (R29.898)    Time: 6384-5364 PT Time Calculation (min) (ACUTE ONLY): 14 min   Charges:   PT Evaluation $PT Eval Moderate Complexity: 1 Mod          Jette Lewan A. Gilford Rile PT, DPT Acute Rehabilitation Services Pager [phone] Office [phone]   Linna Hoff 04/04/2021, 1:37 PM

## 2021-04-04 NOTE — Progress Notes (Signed)
NAME:  [name] [name], MRN:  [id number], DOB:  1956-01-19, LOS: 2 ADMISSION DATE:  04/01/2021, CONSULTATION DATE:  04/02/21 REFERRING MD:  Alcario Drought, CHIEF COMPLAINT:  seizures  History of Present Illness:  65 yo man with hx COPD, metastatic nsclc treated with chemo and immunotherapy, hx of crani, HTN, of R sided deficits from hx of brain tumor,  Brought to ED after being found outdoors  confused and aphasic by neighbors, presented with tremors, aphasia.   Seen by home health in am, acting somewhat confused but not aphasic.  Normal at 5 pm 11/13 pm per family.    On admit to ED, tachycardia tachypnea.  Started on vanc and cefepime and flagyl Ativan1mg  x 1,  2L LR, and  2 spells of RLE jerking in CT.  Neurology thought findings 2/2 partial complex seizure vs stroke.  Stereotatereotactic radiosurgery to brain x6 days ago.  Radiosurgery to brain x6 days ago.  Vomited while altered, became hypoxic, repeated emesis, was intubated by EDP.   Pertinent  Medical History  Emphysema  NSCLC, metastatic  HTN Iron deficiency anemia Seizure hx, brain mets ( on keppra at home) Tracheal mass   Significant Hospital Events: Including procedures, antibiotic start and stop dates in addition to other pertinent events   Admitted 11/14 with seizure,MRI-> metastatis parasagittal left parietal lobe with surrounding vasogenic edema without MLS, decadron started; acute respiratory insufficiency, vomited-> hypoxic, intubated, cefepime/ vanc 11/15 changed to cEEG, not f/c, gross hematuria-> urology consulted, catheter changes/ bladder irrigated 11/16 extubated  Interim History / Subjective:   Extubated successfully 11/16 Hemoglobin stable 9.7 No more hematuria  Objective   Blood pressure (!) 158/99, pulse 77, temperature 97.7 F (36.5 C), temperature source Oral, resp. rate (!) 26, height 5' (1.524 m), weight 56.8 kg, SpO2 98 %.    Vent Mode: CPAP;PSV FiO2 (%):  [40 %] 40 % PEEP:  [5 cmH20] 5  cmH20 Pressure Support:  [5 cmH20] 5 cmH20   Intake/Output Summary (Last 24 hours) at 04/04/2021 0833 Last data filed at 04/04/2021 0700 Gross per 24 hour  Intake 2398.05 ml  Output 1350 ml  Net 1048.05 ml   Filed Weights   04/03/21 0500 04/04/21 0500  Weight: 56 kg 56.8 kg   Examination: General: Remained, symptoms try to get up but no real agitation HEENT: Oropharynx moist, pupils equal and reactive Neuro: Eyes open, tracks, intermittently will follow commands.  He perseverates, answers the same question over and over.  Moving both upper extremities with good strength CV: Regular, no murmur PULM: Decreased to both bases, no wheezes or crackles GI: Nondistended, positive bowel sounds, yellow urine in Foley Extremities: No edema Skin: No rash  11/14 SARs/ flu > neg 11/15 MRSA pcr > neg 11/14 Bcx2 > 11/14 UC > negative  11/14 vanc > 11/16 11/14 cefepime > 11/16 11/14 flagyl 11/16 ceftriaxone   CT head 10/26: IMPRESSION: New 1.1 cm metastasis of the parasagittal left parietal lobe with mild edema. No evidence of recurrent disease at the operative site.  CXR: gastric tube needs advancing, ET tube fine, left lower lobe lesion, no new consolidation or abnormality   MRI  IMPRESSION: 1. Technically limited exam due to extensive motion artifact. 2. 1.4 cm metastasis involving the parasagittal left parietal lobe. Surrounding vasogenic edema without midline shift. 3. Diffuse dural thickening and enhancement about the adjacent craniotomy site, favored to be postoperative in nature, although attention at follow-up is recommended. 4. Question subtly increased diffusion signal abnormality at the left thalamus. Although this  finding is not entirely certain given motion artifact on this exam, possible subtle changes of ischemia or possibly seizure could be considered. Additional mildly increased diffusion signal within the adjacent left parieto-occipital region could also reflect  changes of acute seizure. Correlation with EEG recommended. 5. No other acute intracranial abnormality  11/15 CT abd/ pelvis >> 1. Negative for pneumoperitoneum or bowel abnormality. 2. But positive for asymmetric delayed nephrograms (worse on the left) with abundant abnormal pararenal space stranding and/or fluid.  But no hydronephrosis or obstructing lesion. Bladder decompressed by Foley catheter, although with intermediate density filling defect in the bladder lumen. Constellation suggests Acute Intrinsic Renal Failure. Query hematuria. Urinalysis recommended. 3. Positive also for patchy opacity in the lateral basal segment of the right lower lobe suspicious for Acute Bronchopneumonia. And trace pleural effusions are new since July. 4. And also new small volume pericardial effusion with possible complex fluid density. Query Pericarditis. 5. Enteric tube terminates in the gastric body  11/15 TTE >> Moderate pericardial effusion that is apical and anterior to the RV.  Largest at 17 mm. There is no RV or RA collapse.  LVEF 60%, no RWA, indeterminate diastolic filling, normal RV function  Resolved Hospital Problem list    Assessment & Plan:   Seizures  Brain metastasis s/p crani 08/2020 with SRS/ chemo now with vasogenic edema on MRI, s/p first [facility] 11/8 P: -Appreciate neurology assistance -Continuous EEG, no recurrent seizures noted but at risk for this -Minimizing sedation as able, Ativan as needed if seizure activity -AED > Vimpat, Keppra -Dexamethasone as ordered.  Wean when okay with neurology -Culture data is negative thus far -Question predicted course for neurological improvement, clearly not back to his usual baseline.  May be postictal, post sedation   Respiratory failure related to above with vomiting episodes RLL aspiration PNA Metastatic NSCLC -currently on chemo/ SRS, immunocompromised  - last chemo 10/17 - Alimta and Keytruda (q 3weeks) Hx COPD P:  -Extubated  11/16 -Push pulmonary hygiene -Complete 7 days total antibiotics, currently narrowed to ceftriaxone -Scheduled DuoNeb, discontinue Pulmicort 11/17  Hematuria, resolved.  Question whether this was due to traumatic Foley placement on presentation Anemia  - with abnormal renal imaging on CT abd/ pelvis w/o -> asymmetric delayed nephrograms with renal stranding, no hydronephrosis or obvious obstructing lesion, and bladder was decompressed with Foley catheter with intermediate density filling defect in the bladder P: -Three-way Foley in place, hematuria appears to be improved -Follow CBC, currently stable -Urine culture negative -Will need follow-up with urology as an outpatient, consider cystoscopy  Pericardial effusion - moderate pericardial effusion that is apical and anterior to the RV, no RV/ LV compromise.  -Consider outpatient follow-up, repeat echocardiogram   Mild coffee ground emesis - on admit, cleared -continue PPI twice daily while he is on steroids -Could consider further outpatient gastroenterology work-up    Best Practice (right click and "Reselect all SmartList Selections" daily)   Diet/type: NPO  DVT prophylaxis: other held given hematuria.  If remains stable can start back VTE ppx 11/17 GI prophylaxis: PPI Lines: N/A has port right chest Foley:  Yes, and it is still needed Code Status:  full code Last date of multidisciplinary goals of care discussion []  POA Peggy at bedside and updated on 11/17  Labs   CBC: Recent Labs  Lab 04/01/21 1859 04/01/21 1936 04/02/21 0503 04/02/21 0819 04/02/21 1412 04/02/21 2000 04/03/21 0344  WBC 5.6  --  10.8*  --   --   --  9.5  NEUTROABS 3.8  --  9.9*  --   --   --   --   HGB 11.9*   < > 12.1* 12.9* 10.6* 9.8* 9.7*  HCT 37.8*   < > 38.2* 38.0* 33.5* 30.4* 30.8*  MCV 84.6  --  85.7  --   --   --  85.3  PLT 238  --  256  --   --   --  229   < > = values in this interval not displayed.    Basic Metabolic Panel: Recent  Labs  Lab 04/01/21 1859 04/01/21 1936 04/02/21 0253 04/02/21 0344 04/02/21 0503 04/02/21 0819 04/02/21 1412 04/03/21 0344  NA 137 137  136 136 138 133* 138  --  137  K 3.7 3.6  3.5 3.9 3.5 3.8 4.1  --  3.7  CL 105 105 104  --  100  --   --  106  CO2 19*  --  21*  --  23  --   --  23  GLUCOSE 90 93 113*  --  129*  --   --  128*  BUN 5* 3* 6*  --  5*  --   --  9  CREATININE 0.76 0.70 0.83  --  0.76  --   --  0.79  CALCIUM 9.4  --  9.1  --  8.9  --   --  8.5*  MG 1.5*  --   --   --   --   --  1.8 1.8  PHOS  --   --   --   --   --   --  3.4 3.1   GFR: Estimated Creatinine Clearance: 66 mL/min (by C-G formula based on SCr of 0.79 mg/dL). Recent Labs  Lab 04/01/21 1859 04/01/21 1905 04/01/21 2105 04/02/21 0253 04/02/21 0503 04/03/21 0344  PROCALCITON  --   --   --  0.15  --   --   WBC 5.6  --   --   --  10.8* 9.5  LATICACIDVEN  --  2.2* 1.9  --  1.1  --     Liver Function Tests: Recent Labs  Lab 04/01/21 1859 04/03/21 0344  AST 27  --   ALT 12  --   ALKPHOS 53  --   BILITOT 0.4  --   PROT 6.7  --   ALBUMIN 3.4* 2.6*   No results for input(s): LIPASE, AMYLASE in the last 168 hours. Recent Labs  Lab 04/01/21 1859  AMMONIA 26    ABG    Component Value Date/Time   PHART 7.391 04/02/2021 0819   PCO2ART 41.9 04/02/2021 0819   PO2ART 136 (H) 04/02/2021 0819   HCO3 25.3 04/02/2021 0819   TCO2 27 04/02/2021 0819   ACIDBASEDEF 2.0 04/02/2021 0344   O2SAT 99.0 04/02/2021 0819     Coagulation Profile: Recent Labs  Lab 04/01/21 2000 04/02/21 0503  INR 1.0 1.0    Cardiac Enzymes: Recent Labs  Lab 04/02/21 0503  CKTOTAL 178    HbA1C: Hemoglobin A1C  Date/Time Value Ref Range Status  05/31/2018 04:54 PM 5.6 4.0 - 5.6 % Final   Hgb A1c MFr Bld  Date/Time Value Ref Range Status  05/29/2008 09:43 PM  4.6 - 6.1 % Final   5.7 (NOTE)   The ADA recommends the following therapeutic goal for glycemic   control related to Hgb A1C measurement:   Goal of  Therapy:   < 7.0% Hgb A1C   Reference:  American Diabetes Association: Clinical Practice   Recommendations 2008, Diabetes Care,  2008, 31:(Suppl 1).    CBG: Recent Labs  Lab 04/03/21 1545 04/03/21 1929 04/03/21 2319 04/04/21 0330 04/04/21 0723  GLUCAP 108* 104* 112* 99 97    Critical care time: 31 min     Baltazar Apo, MD, PhD 04/04/2021, 9:00 AM [facility] [phone] or if no answer before 7:00PM call [phone] For any issues after 7:00PM please call eLink [phone]

## 2021-04-04 NOTE — Progress Notes (Signed)
LTM EEG discontinued - no skin breakdown at unhook.   

## 2021-04-04 NOTE — Progress Notes (Signed)
Occupational Therapy Evaluation  PTA pt lives independently alone in an apt, however need clarification due to pt poor historian. Pt able to progress to EOB with Max A +2 and requires total A with all ADL at this time due to deficits listed below. Once EOB, more alert and verbal and inconsistently following commands. Demonstrates focused attention, is perseverating on his age and appears to be hallucinating (talking to people not in room). Recommend nsg place pt in chair position at least 3x/day, reorient frequently, blinds open/lights on during the day and other measures to reduce risk of delirium. Pt will benefit from rehab at SNF. Will follow acutely.  VSS during session.    04/04/21 1500  OT Visit Information  Last OT Received On 04/04/21  Assistance Needed +2  PT/OT/SLP Co-Evaluation/Treatment Yes  Reason for Co-Treatment Complexity of the patient's impairments (multi-system involvement);For patient/therapist safety;Necessary to address cognition/behavior during functional activity  OT goals addressed during session ADL's and self-care  History of Present Illness 65 y/o male presented to ED on 11/14 after being found outside by neighbor acting abnormally and confusion. Recently underwent stereotactic radiosurgery to brain x 6 days ago. CT head showed hypodensity in L parietal and occipital lobes. MRI showed 1.4 cm metastsis involving parasagittal L parietal lobe. Seizure activity in ED. No seizures noted on EEG. Intubated 11/15-11/16. PMH: HTN, anemia, bullous emphysema, COPD, and non-small cell lung cancer status post chemoradiation and now on immunotherapy, hx of crani and R sided deficits from hx of brain tumor.  Precautions  Precautions Fall  Precaution Comments restraints, mittens  Home Living  Family/patient expects to be discharged to: Private residence  [place] Access Level entry  [place] One level  Bathroom Shower/Tub Tub/shower  unit  [place] bars - tub/shower  Additional Comments some home info obtained  from chart review from admission 08/2020  Prior Function  Prior Level of Function  Independent/Modified Independent  Mobility Comments assume independence but patient poor historian; lived alone  Engineer, petroleum Other (comment) (will further assess)  Pain Assessment  Pain Assessment Faces  Faces Pain Scale 2  Pain Location foley  Pain Descriptors / Indicators Grimacing  Pain Intervention(s) Limited activity within patient's tolerance  Cognition  Arousal/Alertness Awake/alert  Behavior During Therapy Flat affect  Overall Cognitive Status Impaired/Different from baseline  Area of Impairment Orientation;Attention;Following commands;Safety/judgement;Awareness;Problem solving  Orientation Level Disoriented to;Place;Time;Situation (once sitting EOB able to states he was at [facility])  Current Attention Level Focused  Following Commands Follows one step commands inconsistently  Safety/Judgement Decreased awareness of safety;Decreased awareness of deficits  Awareness Intellectual  Problem Solving Slow processing;Decreased initiation;Requires verbal cues;Requires tactile cues;Difficulty sequencing  General Comments A&Ox1. Following commands intermittently ~25% of the time. Requires max cueing for sequencing; appears to be hallucinating at times  Upper Extremity Assessment  Upper Extremity Assessment Generalized weakness (will further assess; not holding washcloth or bringing either hand to mouth spontaneously)  Lower Extremity Assessment  Lower Extremity Assessment Defer to PT evaluation  Cervical / Trunk Assessment  Cervical / Trunk Assessment Kyphotic  Vision- History  Baseline Vision/History 1 Wears glasses (reading)  Vision- Assessment  Additional Comments tracking all directions; will further assess  Perception  Comments imparied spatial awareness  Praxis   Praxis tested? Deficits  Deficits Perseveration (perseverating on his age)  ADL  Overall ADL's  Needs assistance/impaired  General ADL Comments total A at this time for ADL; NPO  Bed Mobility  Overal bed mobility Needs Assistance  Bed Mobility Supine to Sit;Sit to Supine  Supine to sit Max assist;+2 for physical assistance;+2 for safety/equipment  Sit to supine Total assist;+2 for physical assistance;+2 for safety/equipment  General bed mobility comments maxA+2 for initiation towards EOB, bringing LEs off and trunk elevation. TotalA+2 to return to supine  Transfers  Overall transfer level Needs assistance  Equipment used 2 person hand held assist  Transfers Sit to/from Stand  Sit to Stand Max assist;+2 physical assistance;+2 safety/equipment  General transfer comment maxA+2 to stand from EOB. Unable to achieve full upright standing  Balance  Overall balance assessment Needs assistance  Sitting-balance support No upper extremity supported;Feet supported  Sitting balance-Leahy Scale Poor  Standing balance support Bilateral upper extremity supported  Standing balance-Leahy Scale Zero  OT - End of Session  Equipment Utilized During Treatment Oxygen  Activity Tolerance Patient tolerated treatment well  Patient left in bed;with call bell/phone within reach;with bed alarm set;with SCD's reapplied;with restraints reapplied (chair position)  Nurse Communication Mobility status;Other (comment) (encourage chair position; more alert when upright)  OT Assessment  OT Recommendation/Assessment Patient needs continued OT Services  OT Visit Diagnosis Unsteadiness on feet (R26.81);Other abnormalities of gait and mobility (R26.89);Muscle weakness (generalized) (M62.81);Other symptoms and signs involving the nervous system (R29.898);Other symptoms and signs involving cognitive function  OT Problem List Decreased strength;Decreased range of motion;Decreased activity tolerance;Impaired balance (sitting  and/or standing);Impaired vision/perception;Decreased coordination;Decreased cognition;Decreased safety awareness;Decreased knowledge of use of DME or AE;Impaired UE functional use;Pain;Cardiopulmonary status limiting activity  OT Plan  OT Frequency (ACUTE ONLY) Min 2X/week  OT Treatment/Interventions (ACUTE ONLY) Self-care/ADL training;Therapeutic exercise;Neuromuscular education;Energy conservation;DME and/or AE instruction;Therapeutic activities;Cognitive remediation/compensation;Visual/perceptual remediation/compensation;Patient/family education;Balance training  AM-PAC OT "6 Clicks" Daily Activity Outcome Measure (Version 2)  Help from another person eating meals? 1  Help from another person taking care of personal grooming? 1  Help from another person toileting, which includes using toliet, bedpan, or urinal? 1  Help from another person bathing (including washing, rinsing, drying)? 1  Help from another person to put on and taking off regular upper body clothing? 1  Help from another person to put on and taking off regular lower body clothing? 1  6 Click Score 6  Progressive Mobility  What is the highest level of mobility based on the progressive mobility assessment? Level 1 (Bedfast) - Unable to balance while sitting on edge of bed  Mobility Sit up in bed/chair position for meals  OT Recommendation  Follow Up Recommendations Skilled nursing-short term rehab (<3 hours/day)  Assistance recommended at discharge Frequent or constant Supervision/Assistance  Functional Status Assessent Patient has had a recent decline in their functional status and demonstrates the ability to make significant improvements in function in a reasonable and predictable amount of time.  OT Equipment BSC/3in1  Individuals Consulted  Consulted and Agree with Results and Recommendations Patient unable/family or caregiver not available  Acute Rehab OT Goals  Patient Stated Goal unable to state  OT Goal Formulation  Patient unable to participate in goal setting  Time For Goal Achievement 04/18/21  Potential to Achieve Goals Fair  OT Time Calculation  OT Start Time (ACUTE ONLY) 1147  OT Stop Time (ACUTE ONLY) 1212  OT Time Calculation (min) 25 min  OT General Charges  $OT Visit 1 Visit  OT Evaluation  $OT Eval Moderate Complexity 1 Mod  Written Expression  Dominant Hand Left  Maurie Boettcher, OT/L   Acute OT Clinical Specialist Acute Rehabilitation Services Pager [phone]  Office [phone]

## 2021-04-04 NOTE — Evaluation (Signed)
Clinical/Bedside Swallow Evaluation Patient Details  Name: [name] [name] MRN: [id number] Date of Birth: 1956-01-17  Today's Date: 04/04/2021 Time: SLP Start Time (ACUTE ONLY): 45 SLP Stop Time (ACUTE ONLY): 7209 SLP Time Calculation (min) (ACUTE ONLY): 30 min  Past Medical History:  Past Medical History:  Diagnosis Date   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[place]) 12/25/2019   Cancer ([place])    COPD (chronic obstructive pulmonary disease) ([place]) 11/02/2007   Qualifier: Diagnosis of  By: Melvyn Novas MD, Christena Deem    Hypertension 12/24/2019   Iron deficiency anemia 08/23/2007   Qualifier: Diagnosis of  By: Jim Like     Past Surgical History:  Past Surgical History:  Procedure Laterality Date   APPLICATION OF CRANIAL NAVIGATION N/A 08/30/2020   Procedure: APPLICATION OF CRANIAL NAVIGATION;  Surgeon: Consuella Lose, MD;  Location: [facility];  Service: Neurosurgery;  Laterality: N/A;   CRANIOTOMY Left 08/30/2020   Procedure: Stereotactic left frontoparietal craniectomy for resection of tumor with brainlab;  Surgeon: Consuella Lose, MD;  Location: [facility];  Service: Neurosurgery;  Laterality: Left;   ENDOBRONCHIAL ULTRASOUND N/A 12/27/2019   Procedure: ENDOBRONCHIAL ULTRASOUND;  Surgeon: Laurin Coder, MD;  Location: [facility];  Service: Endoscopy;  Laterality: N/A;   FINE NEEDLE ASPIRATION  12/27/2019   Procedure: FINE NEEDLE ASPIRATION;  Surgeon: Laurin Coder, MD;  Location: [facility];  Service: Endoscopy;;   IR IMAGING GUIDED PORT INSERTION  11/30/2020   VIDEO BRONCHOSCOPY N/A 12/27/2019   Procedure: VIDEO BRONCHOSCOPY WITHOUT FLUORO;  Surgeon: Laurin Coder, MD;  Location: [facility];  Service: Endoscopy;  Laterality: N/A;   HPI:       Assessment / Plan / Recommendation  Clinical Impression  Pt seen at bedside for assessment of swallow function and safety, and to identify least restrictive diet. Pt was seated upright in bed, awake and alert. Pt struggled to communicate verbally.  His responses were confused and perseverative. He had difficulty following commands, and frequently kept talking after a bolus was placed in his mouth.  Pt accepted trials of ice chips, thin liquid, and puree textures. Extended oral prep and holding noted, with pt frequently requiring cues to swallow. No overt s/s aspiration observed on liquids or puree trials, however, pt requires close supervision/assistance to maximize safety. In addition, chart review indicates a "tracheal mass", however, specific information regarding its size or whereabouts has not been located. Will continue to investigate this, given its potential implications on the safety of pt's swallowing. Pt may benefit from an instrumental study to more closely evaluate swallow safety.   Recommend beginning with dys 1 (puree) diet and thin liquids, meds crushed in puree. Safe swallow precautions posted at [facility] and reviewed with RN. SLP will continue to follow.  SLP Visit Diagnosis: Dysphagia, unspecified (R13.10)    Aspiration Risk  Mild aspiration risk;Moderate aspiration risk    Diet Recommendation Dysphagia 1 (Puree);Thin liquid   Liquid Administration via: Straw Medication Administration: Crushed with puree Supervision: Full supervision/cueing for compensatory strategies Compensations: Minimize environmental distractions;Small sips/bites;Slow rate Postural Changes: Seated upright at 90 degrees;Remain upright for at least 30 minutes after po intake    Other  Recommendations Oral Care Recommendations: Oral care BID Other Recommendations: Have oral suction available    Recommendations for follow up therapy are one component of a multi-disciplinary discharge planning process, led by the attending physician.  Recommendations may be updated based on patient status, additional functional criteria and insurance authorization.  Follow up Recommendations Skilled nursing-short term rehab (<3 hours/day)  Assistance Recommended at  Discharge Frequent or constant Supervision/Assistance  Functional Status Assessment Patient has had a recent decline in their functional status and/or demonstrates limited ability to make significant improvements in function in a reasonable and predictable amount of time   Frequency and Duration min 2x/week  1 week;2 weeks       Prognosis Prognosis for Safe Diet Advancement: Fair Barriers to Reach Goals: Severity of deficits      Swallow Study   General      Oral/Motor/Sensory Function Overall Oral Motor/Sensory Function: Within functional limits   Ice Chips Ice chips: Within functional limits Presentation: Spoon   Thin Liquid Thin Liquid: Impaired Oral Phase Impairments: Poor awareness of bolus Oral Phase Functional Implications: Prolonged oral transit;Oral holding    Nectar Thick Nectar Thick Liquid: Not tested   Honey Thick Honey Thick Liquid: Not tested   Puree Puree: Impaired Oral Phase Impairments: Poor awareness of bolus Oral Phase Functional Implications: Oral holding;Prolonged oral transit   Solid     Solid: Not tested     Leianne Callins B. Quentin Ore, [facility], [place] Speech Language Pathologist Office: [phone]  Shonna Chock 04/04/2021,1:30 PM

## 2021-04-04 NOTE — Progress Notes (Addendum)
Nutrition Follow-up  DOCUMENTATION CODES:   Non-severe (moderate) malnutrition in context of chronic illness  INTERVENTION:  - Ensure Enlive po BID, each supplement provides 350 kcal and 20 grams of protein  - Encourage PO intake  - If pt unable to meet needs via PO intake, recommend Cortrak placement  Tube feed recommendations if Cortrak is necessary: Start Osmolite 1.5 at 78ml/h and advance by 13ml every 6 hours until reaching a goal rate of 50 ml/h  (1200 ml per day) Prosource TF 45 ml daily  Provides 1840 kcal, 86 gm protein, 914 ml free water daily  NUTRITION DIAGNOSIS:   Moderate Malnutrition related to chronic illness, cancer and cancer related treatments as evidenced by moderate fat depletion, moderate muscle depletion, percent weight loss.  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  11/6- OG placed, intubated 11/16- extubated, OG removed  Pt awake in bed during visit however unable to give clear response to questions. Per SLP bedside evaluation today, pt cleared for dysphagia 1, thin liquid diet. Pt not following commands. Per review of chart and visit with outpatient RD, pt typically drinks 2 Ensure daily at home. Will order and monitor for intake. Suspect that pt may not be able to meet nutritional needs d/t mental status.   Per review of chart, pt's weight remains steady. Current weight 56.8kg. Will continue to monitor.  Medications: dexamethasone, protonix, miralax, IV abx  Labs: BUN 7, Phos 2.4  Diet Order:   Diet Order             DIET - DYS 1 Room  service appropriate? No; Fluid consistency: Thin  Diet effective now                   EDUCATION NEEDS:   No education needs have been identified at this time  Skin:  Skin Assessment: Reviewed RN Assessment  Last BM:  04/02/21  Height:   Ht Readings from Last 1 Encounters:  04/02/21 5' (1.524 m)    Weight:   Wt Readings from Last 1 Encounters:  04/04/21 56.8 kg    BMI:  Body mass index is 24.46 kg/m.  Estimated Nutritional Needs:   Kcal:  1700-1900  Protein:  85-95g  Fluid:  >1.7L  Clayborne Dana, RDN, LDN Clinical Nutrition

## 2021-04-04 NOTE — Progress Notes (Signed)
SLP Cancellation Note  Patient Details Name: [name] [name] MRN: [id number] DOB: [birth year]   Cancelled treatment:       Reason Eval/Treat Not Completed: Fatigue/lethargy limiting ability to participate  RN reports pt has just received Ativan, and is inappropriate for swallow evaluation at this time. She states he would probably perform better later today. Will continue efforts.   Joyce Heitman B. Quentin Ore, [facility], [place] Speech Language Pathologist Office: [phone]  Shonna Chock 04/04/2021, 9:22 AM

## 2021-04-04 NOTE — Procedures (Signed)
Patient Name: [name] [name]  MRN: [id number]  Epilepsy Attending: Lora Havens  Referring Physician/Provider: Dr Roland Rack Duration: 04/03/2021 1101 to 04/04/2021 1020   Patient history:  65 yo M with new onset aphasia due to seizures in the setting of known intracranial metastasis. EEG to evaluate for seizure   Level of alertness:  lethargic    AEDs during EEG study: LEV   Technical aspects: This EEG study was done with scalp electrodes positioned according to the 10-20 International system of electrode placement. Electrical activity was acquired at a sampling rate of 500Hz  and reviewed with a high frequency filter of 70Hz  and a low frequency filter of 1Hz . EEG data were recorded continuously and digitally stored.    Description: EEG showed continuous generalized and maximal left posterior temporal region 3 to 6 Hz theta-delta slowing with overriding 15 to 18 Hz generalized beta activity. Lateralized periodic discharges with overriding fast activity were also noted in left posterior temporal region with fluctuating frequency between 0.5 to 1 Hz which at times appeared rhythmic without definite evolution consistent with brief ictal-interictal rhythmic discharges.  Hyperventilation and photic stimulation were not performed.      ABNORMALITY -Periodic discharges with overriding fast activity, left hemisphere, maximal left posterior temporal region ( LPD+) -Brief ictal-interictal rhythmic discharges, left hemisphere, maximal left posterior temporal region -Continuous slow, generalized and maximal left posterior temporal region   IMPRESSION: This study showed evidence of epileptogenicity and cortical dysfunction arising from left hemisphere, maximal left posterior temporal region which is on the ictal-interictal continuum with high potential for seizure recurrence.  Additionally there is moderate diffuse encephalopathy, nonspecific etiology.  No definite seizures were seen.    Pritika Alvarez Barbra Sarks

## 2021-04-05 DIAGNOSIS — R569 Unspecified convulsions: Secondary | ICD-10-CM | POA: Diagnosis not present

## 2021-04-05 LAB — GLUCOSE, CAPILLARY
Glucose-Capillary: 102 mg/dL — ABNORMAL HIGH (ref 70–99)
Glucose-Capillary: 89 mg/dL (ref 70–99)
Glucose-Capillary: 111 mg/dL — ABNORMAL HIGH (ref 70–99)
Glucose-Capillary: 84 mg/dL (ref 70–99)
Glucose-Capillary: 116 mg/dL — ABNORMAL HIGH (ref 70–99)
Glucose-Capillary: 105 mg/dL — ABNORMAL HIGH (ref 70–99)

## 2021-04-05 LAB — RENAL FUNCTION PANEL
Albumin: 2.6 g/dL — ABNORMAL LOW (ref 3.5–5.0)
Anion gap: 6 (ref 5–15)
BUN: 7 mg/dL — ABNORMAL LOW (ref 8–23)
CO2: 30 mmol/L (ref 22–32)
Calcium: 9 mg/dL (ref 8.9–10.3)
Chloride: 104 mmol/L (ref 98–111)
Creatinine, Ser: 0.65 mg/dL (ref 0.61–1.24)
GFR, Estimated: >60 mL/min (ref >60)
Glucose, Bld: 102 mg/dL — ABNORMAL HIGH (ref 70–99)
Phosphorus: 2.9 mg/dL (ref 2.5–4.6)
Potassium: 3.4 mmol/L — ABNORMAL LOW (ref 3.5–5.1)
Sodium: 140 mmol/L (ref 135–145)

## 2021-04-05 LAB — CBC
HCT: 30.5 % — ABNORMAL LOW (ref 39.0–52.0)
Hemoglobin: 9.3 g/dL — ABNORMAL LOW (ref 13.0–17.0)
MCH: 25.9 pg — ABNORMAL LOW (ref 26.0–34.0)
MCHC: 30.5 g/dL (ref 30.0–36.0)
MCV: 85 fL (ref 80.0–100.0)
Platelets: 248 10*3/uL (ref 150–400)
RBC: 3.59 MIL/uL — ABNORMAL LOW (ref 4.22–5.81)
RDW: 16.1 % — ABNORMAL HIGH (ref 11.5–15.5)
WBC: 6.2 10*3/uL (ref 4.0–10.5)
nRBC: 0 % (ref 0.0–0.2)

## 2021-04-05 MED ORDER — POTASSIUM CHLORIDE 10 MEQ/50ML IV SOLN
10.0000 meq | INTRAVENOUS | Status: AC
  Administered 2021-04-05 (×4): 10 meq via INTRAVENOUS
  Filled 2021-04-05 (×4): qty 50

## 2021-04-05 MED ORDER — POLYETHYLENE GLYCOL 3350 17 G PO PACK
17.0000 g | PACK | Freq: Every day | ORAL | Status: DC
  Administered 2021-04-05 – 2021-04-15 (×9): 17 g via ORAL
  Filled 2021-04-05 (×9): qty 1

## 2021-04-05 MED ORDER — LEVETIRACETAM 500 MG PO TABS
1000.0000 mg | ORAL_TABLET | Freq: Two times a day (BID) | ORAL | Status: DC
  Administered 2021-04-05 – 2021-04-15 (×20): 1000 mg via ORAL
  Filled 2021-04-05 (×20): qty 2

## 2021-04-05 MED ORDER — PANTOPRAZOLE SODIUM 40 MG PO TBEC
40.0000 mg | DELAYED_RELEASE_TABLET | Freq: Every day | ORAL | Status: DC
  Administered 2021-04-05 – 2021-04-15 (×11): 40 mg via ORAL
  Filled 2021-04-05 (×11): qty 1

## 2021-04-05 MED ORDER — CLONAZEPAM 0.25 MG PO TBDP
0.2500 mg | ORAL_TABLET | Freq: Two times a day (BID) | ORAL | Status: DC
  Administered 2021-04-05 – 2021-04-15 (×21): 0.25 mg via ORAL
  Filled 2021-04-05 (×21): qty 1

## 2021-04-05 NOTE — Progress Notes (Signed)
NAME:  [name] [name], MRN:  [id number], DOB:  04/18/1956, LOS: 3 ADMISSION DATE:  04/01/2021, CONSULTATION DATE:  04/02/21 REFERRING MD:  Alcario Drought, CHIEF COMPLAINT:  seizures  History of Present Illness:  65 yo man with hx COPD, metastatic nsclc treated with chemo and immunotherapy, hx of crani, HTN, of R sided deficits from hx of brain tumor,  Brought to ED after being found outdoors  confused and aphasic by neighbors, presented with tremors, aphasia.   Seen by home health in am, acting somewhat confused but not aphasic.  Normal at 5 pm 11/13 pm per family.    On admit to ED, tachycardia tachypnea.  Started on vanc and cefepime and flagyl Ativan1mg  x 1,  2L LR, and  2 spells of RLE jerking in CT.  Neurology thought findings 2/2 partial complex seizure vs stroke.  Stereotatereotactic radiosurgery to brain x6 days ago.  Radiosurgery to brain x6 days ago.  Vomited while altered, became hypoxic, repeated emesis, was intubated by EDP.   Pertinent  Medical History  Emphysema  NSCLC, metastatic  HTN Iron deficiency anemia Seizure hx, brain mets ( on keppra at home) Tracheal mass   Significant Hospital Events: Including procedures, antibiotic start and stop dates in addition to other pertinent events   Admitted 11/14 with seizure,MRI-> metastatis parasagittal left parietal lobe with surrounding vasogenic edema without MLS, decadron started; acute respiratory insufficiency, vomited-> hypoxic, intubated, cefepime/ vanc 11/15 changed to cEEG, not f/c, gross hematuria-> urology consulted, catheter changes/ bladder irrigated 11/16 extubated Occasional agitation still requiring restraints and prn ativan  Interim History / Subjective:   Extubated successfully 11/16 On supplemental oxygen No seizures  Objective   Blood pressure (!) 164/87, pulse (!) 58, temperature 98.6 F (37 C), temperature source Oral, resp. rate 20, height 5' (1.524 m), weight 58.5 kg, SpO2 100 %.        Intake/Output  Summary (Last 24 hours) at 04/05/2021 0737 Last data filed at 04/05/2021 0600 Gross per 24 hour  Intake 436.94 ml  Output 2010 ml  Net -1573.06 ml   Filed Weights   04/03/21 0500 04/04/21 0500 04/05/21 0400  Weight: 56 kg 56.8 kg 58.5 kg   Examination: General: Calm, interactive  HEENT: Dry oral mucosa Neuro: Pupils reacting to light and accommodation CV: S1-S2 appreciated with no murmur PULM: Decreased breath sounds bibasilarly GI: Bowel sounds appreciated Extremities: No edema Skin: No rash  11/14 SARs/ flu > neg 11/15 MRSA pcr > neg 11/14 Bcx2 > 11/14 UC > negative  11/14 vanc > 11/16 11/14 cefepime > 11/16 11/14 flagyl 11/16 ceftriaxone >>  CT head 10/26: IMPRESSION: New 1.1 cm metastasis of the parasagittal left parietal lobe with mild edema. No evidence of recurrent disease at the operative site.  CXR: gastric tube needs advancing, ET tube fine, left lower lobe lesion, no new consolidation or abnormality   MRI  IMPRESSION: 1. Technically limited exam due to extensive motion artifact. 2. 1.4 cm metastasis involving the parasagittal left parietal lobe. Surrounding vasogenic edema without midline shift. 3. Diffuse dural thickening and enhancement about the adjacent craniotomy site, favored to be postoperative in nature, although attention at follow-up is recommended. 4. Question subtly increased diffusion signal abnormality at the left thalamus. Although this finding is not entirely certain given motion artifact on this exam, possible subtle changes of ischemia or possibly seizure could be considered. Additional mildly increased diffusion signal within the adjacent left parieto-occipital region could also reflect changes of acute seizure. Correlation with EEG recommended. 5. No  other acute intracranial abnormality  11/15 CT abd/ pelvis >> 1. Negative for pneumoperitoneum or bowel abnormality. 2. But positive for asymmetric delayed nephrograms (worse on the  left) with abundant abnormal pararenal space stranding and/or fluid.  But no hydronephrosis or obstructing lesion. Bladder decompressed by Foley catheter, although with intermediate density filling defect in the bladder lumen. Constellation suggests Acute Intrinsic Renal Failure. Query hematuria. Urinalysis recommended. 3. Positive also for patchy opacity in the lateral basal segment of the right lower lobe suspicious for Acute Bronchopneumonia. And trace pleural effusions are new since July. 4. And also new small volume pericardial effusion with possible complex fluid density. Query Pericarditis. 5. Enteric tube terminates in the gastric body  11/15 TTE >> Moderate pericardial effusion that is apical and anterior to the RV.  Largest at 17 mm. There is no RV or RA collapse.  LVEF 60%, no RWA, indeterminate diastolic filling, normal RV function  Resolved Hospital Problem list    Assessment & Plan:   Seizures Brain metastases s/p craniectomy 4/22 with stereotactic radiation surgery/chemo.  Vasogenic edema on MRI SRS on 11/8 No seizures on monitoring -At risk of continuing seizures -Continue Keppra -Load with Vimpat if needed -Continue dexamethasone -Not back to his baseline yet -Appreciate neurology follow-up  Respiratory failure related to right lower lobe aspiration pneumonia Metastatic non-small cell lung cancer -On chemotherapy/SRS -Last chemo 10/17-Alimta and Keytruda(every 3 weeks) -Extubated 11/16  History of chronic obstructive pulmonary disease -Continue pulmonary hygiene -Bronchodilators  Aspiration pneumonia -On antibiotics, plan to complete 7 days of antibiotics -Narrowed to ceftriaxone  Intermittent agitation -Has been requiring IV Ativan-associated with sleeping for many hours -We will give low-dose Klonopin  Hematuria -Resolved -restart lovenox -Three-way Foley in place -CT abdomen as noted -Will require urology follow-up for possible  cystoscopy  Pericardial effusion -Does not appear to be compromising cardiac function at present -For possible echocardiogram as outpatient for follow-up  Coffee-ground emesis on admission -Has been on PPI -Continue PPI as patient remains on steroids  Switch meds to p.o.  Speech evaluation pending  Best Practice (right click and "Reselect all SmartList Selections" daily)   Diet/type: NPO -we will advance if tolerated DVT prophylaxis: other held given hematuria.  If remains stable can start back VTE ppx 11/17 GI prophylaxis: PPI Lines: N/A has port right chest Foley:  Yes, and it is still needed Code Status:  full code Last date of multidisciplinary goals of care discussion []  POA Peggy at bedside and updated on 11/17  Labs   CBC: Recent Labs  Lab 04/01/21 1859 04/01/21 1936 04/02/21 0503 04/02/21 0819 04/02/21 1412 04/02/21 2000 04/03/21 0344 04/04/21 0830 04/05/21 0500  WBC 5.6  --  10.8*  --   --   --  9.5 10.2 6.2  NEUTROABS 3.8  --  9.9*  --   --   --   --   --   --   HGB 11.9*   < > 12.1*   < > 10.6* 9.8* 9.7* 10.2* 9.3*  HCT 37.8*   < > 38.2*   < > 33.5* 30.4* 30.8* 32.5* 30.5*  MCV 84.6  --  85.7  --   --   --  85.3 84.0 85.0  PLT 238  --  256  --   --   --  229 268 248   < > = values in this interval not displayed.    Basic Metabolic Panel: Recent Labs  Lab 04/01/21 1859 04/01/21 1936 04/02/21 0253 04/02/21 0344 04/02/21  0503 04/02/21 0819 04/02/21 1412 04/03/21 0344 04/04/21 0515 04/05/21 0500  NA 137   < > 136   < > 133* 138  --  137 138 140  K 3.7   < > 3.9   < > 3.8 4.1  --  3.7 4.0 3.4*  CL 105   < > 104  --  100  --   --  106 105 104  CO2 19*  --  21*  --  23  --   --  23 27 30   GLUCOSE 90   < > 113*  --  129*  --   --  128* 91 102*  BUN 5*   < > 6*  --  5*  --   --  9 7* 7*  CREATININE 0.76   < > 0.83  --  0.76  --   --  0.79 0.66 0.65  CALCIUM 9.4  --  9.1  --  8.9  --   --  8.5* 9.2 9.0  MG 1.5*  --   --   --   --   --  1.8 1.8  --    --   PHOS  --   --   --   --   --   --  3.4 3.1 2.4* 2.9   < > = values in this interval not displayed.   GFR: Estimated Creatinine Clearance: 66 mL/min (by C-G formula based on SCr of 0.65 mg/dL). Recent Labs  Lab 04/01/21 1905 04/01/21 2105 04/02/21 0253 04/02/21 0503 04/03/21 0344 04/04/21 0830 04/05/21 0500  PROCALCITON  --   --  0.15  --   --   --   --   WBC  --   --   --  10.8* 9.5 10.2 6.2  LATICACIDVEN 2.2* 1.9  --  1.1  --   --   --     Liver Function Tests: Recent Labs  Lab 04/01/21 1859 04/03/21 0344 04/04/21 0515 04/05/21 0500  AST 27  --   --   --   ALT 12  --   --   --   ALKPHOS 53  --   --   --   BILITOT 0.4  --   --   --   PROT 6.7  --   --   --   ALBUMIN 3.4* 2.6* 2.8* 2.6*   No results for input(s): LIPASE, AMYLASE in the last 168 hours. Recent Labs  Lab 04/01/21 1859  AMMONIA 26    ABG    Component Value Date/Time   PHART 7.391 04/02/2021 0819   PCO2ART 41.9 04/02/2021 0819   PO2ART 136 (H) 04/02/2021 0819   HCO3 25.3 04/02/2021 0819   TCO2 27 04/02/2021 0819   ACIDBASEDEF 2.0 04/02/2021 0344   O2SAT 99.0 04/02/2021 0819     Coagulation Profile: Recent Labs  Lab 04/01/21 2000 04/02/21 0503  INR 1.0 1.0    Cardiac Enzymes: Recent Labs  Lab 04/02/21 0503  CKTOTAL 178    HbA1C: Hemoglobin A1C  Date/Time Value Ref Range Status  05/31/2018 04:54 PM 5.6 4.0 - 5.6 % Final   Hgb A1c MFr Bld  Date/Time Value Ref Range Status  05/29/2008 09:43 PM  4.6 - 6.1 % Final   5.7 (NOTE)   The ADA recommends the following therapeutic goal for glycemic   control related to Hgb A1C measurement:   Goal of Therapy:   < 7.0% Hgb A1C   Reference: American Diabetes Association:  Clinical Practice   Recommendations 2008, Diabetes Care,  2008, 31:(Suppl 1).    CBG: Recent Labs  Lab 04/04/21 1124 04/04/21 1535 04/04/21 1919 04/04/21 2315 04/05/21 0315  GLUCAP 85 78 118* 113* 102*    The patient is critically ill with multiple organ  systems failure and requires high complexity decision making for assessment and support, frequent evaluation and titration of therapies, application of advanced monitoring technologies and extensive interpretation of multiple databases. Critical Care Time devoted to patient care services described in this note independent of APP/resident time (if applicable)  is 32 minutes.   Sherrilyn Rist MD [facility] Personal pager: See Amion If unanswered, please page CCM On-call: [phone]

## 2021-04-05 NOTE — NC FL2 (Signed)
[place] LEVEL OF CARE SCREENING TOOL     IDENTIFICATION  Patient Name: [name] [name] Birthdate: 04/16/1956 Sex: male Admission Date (Current Location): 04/01/2021  [place] and [place] Number:  Kathleen Argue [id number] Facility and Address:  [facility] [place], [place] [address] [place], [place] [address]      Provider Number: [id number]  Attending Physician Name and Address:  Laurin Coder, MD  Relative Name and Phone Number:  Charlynn Grimes [phone] POA    Current Level of Care: Hospital Recommended Level of Care: [place] Prior Approval Number:    Date Approved/Denied:   PASRR Number: [id number]  Discharge Plan: SNF    Current Diagnoses: Patient Active Problem List   Diagnosis Date Noted   Respiratory arrest [facility])    Altered mental status, unspecified altered mental status type 04/02/2021   Seizures ([place]) 04/01/2021   SIRS (systemic inflammatory response syndrome) ([place]) 04/01/2021   Acute encephalopathy 04/01/2021   Neutropenia ([place]) 12/17/2020   Brain mass 08/24/2020   Brain metastasis ([place]) 08/23/2020   Encounter for antineoplastic immunotherapy 04/02/2020   Chemotherapy induced neutropenia ([place]) 02/28/2020   Malignant neoplasm of bronchus of right upper lobe ([place]) 01/03/2020   Encounter for antineoplastic chemotherapy 01/03/2020   Goals of care, counseling/discussion 01/03/2020   Malnutrition of moderate degree 12/26/2019   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[place]) 12/25/2019   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   Hypertension 12/24/2019   Hyponatremia 12/24/2019   Tobacco use 11/02/2007   COPD (chronic obstructive pulmonary disease) ([place]) 11/02/2007   PULMONARY INFILTRATE INCLUDES (EOSINOPHILIA) 08/23/2007    Orientation RESPIRATION BLADDER Height & Weight     Self  Normal Indwelling catheter Weight: 128 lb 15.5 oz (58.5 kg) Height:  5' (152.4 cm)  BEHAVIORAL SYMPTOMS/MOOD NEUROLOGICAL BOWEL NUTRITION STATUS     Convulsions/Seizures Incontinent Diet (see discharge summary)  AMBULATORY STATUS COMMUNICATION OF NEEDS Skin   Total Care Verbally Normal                       Personal Care Assistance Level of Assistance  Bathing, Feeding, Dressing, Total care Bathing Assistance: Maximum assistance Feeding assistance: Maximum assistance Dressing Assistance: Maximum assistance Total Care Assistance: Maximum assistance   Functional Limitations Info  Sight, Hearing, Speech Sight Info: Adequate Hearing Info: Adequate Speech Info: Impaired    SPECIAL CARE FACTORS FREQUENCY  PT (By licensed PT), OT (By licensed OT)     PT Frequency: 5x week OT Frequency: 5x week            Contractures      Additional Factors Info  Code Status, Allergies Code Status Info: full Allergies Info: Other           Current Medications (04/05/2021):  This is the current hospital active medication list Current Facility-Administered Medications  Medication Dose Route Frequency Provider Last Rate Last Admin   acetaminophen (TYLENOL) tablet 650 mg  650 mg Oral Q6H PRN Etta Quill, DO       Or   acetaminophen (TYLENOL) suppository 650 mg  650 mg Rectal Q6H PRN Etta Quill, DO       cefTRIAXone (ROCEPHIN) 1 g in sodium chloride 0.9 % 100 mL IVPB  1 g Intravenous Q24H Collene Gobble, MD   Stopped at 04/05/21 1301   chlorhexidine (PERIDEX) 0.12 % solution 15 mL  15 mL Mouth Rinse BID Collene Gobble, MD   15 mL at 04/05/21 1017   Chlorhexidine Gluconate Cloth 2 % PADS 6  each  6 each Topical Daily Collier Bullock, MD   6 each at 04/04/21 2042   clonazePAM (KLONOPIN) disintegrating tablet 0.25 mg  0.25 mg Oral BID Olalere, Adewale A, MD   0.25 mg at 04/05/21 1122   dexamethasone (DECADRON) injection 4 mg  4 mg Intravenous Q6H Jennette Kettle M, DO   4 mg at 04/05/21 1127   docusate (COLACE) 50 MG/5ML liquid 100 mg  100 mg Per Tube BID PRN Collier Bullock, MD       enoxaparin (LOVENOX) injection 40 mg  40  mg Subcutaneous Q24H Collene Gobble, MD   40 mg at 04/05/21 1017   feeding supplement (ENSURE ENLIVE / ENSURE PLUS) liquid 237 mL  237 mL Oral BID BM Collene Gobble, MD   237 mL at 04/05/21 1020   ipratropium-albuterol (DUONEB) 0.5-2.5 (3) MG/3ML nebulizer solution 3 mL  3 mL Nebulization QID Collene Gobble, MD   3 mL at 04/05/21 1158   labetalol (NORMODYNE) injection 10 mg  10 mg Intravenous Q2H PRN Frederik Pear, MD       levETIRAcetam (KEPPRA) tablet 1,000 mg  1,000 mg Oral BID Olalere, Adewale A, MD       LORazepam (ATIVAN) injection 1-2 mg  1-2 mg Intravenous Q4H PRN Etta Quill, DO   2 mg at 04/05/21 1255   MEDLINE mouth rinse  15 mL Mouth Rinse q12n4p Collene Gobble, MD   15 mL at 04/04/21 1708   ondansetron (ZOFRAN) tablet 4 mg  4 mg Oral Q6H PRN Etta Quill, DO       Or   ondansetron [facility]) injection 4 mg  4 mg Intravenous Q6H PRN Etta Quill, DO   4 mg at 04/02/21 0213   pantoprazole (PROTONIX) EC tablet 40 mg  40 mg Oral Q1200 Olalere, Adewale A, MD   40 mg at 04/05/21 1127   polyethylene glycol (MIRALAX / GLYCOLAX) packet 17 g  17 g Oral Daily Collene Gobble, MD   17 g at 04/05/21 1017     Discharge Medications: Please see discharge summary for a list of discharge medications.  Relevant Imaging Results:  Relevant Lab Results:   Additional Information SSN: [id number]. Pt is vaccinated for covid with one booster.  Joanne Chars, LCSW

## 2021-04-05 NOTE — Progress Notes (Addendum)
Speech Language Pathology Treatment: Dysphagia  Patient Details Name: [name] [name] MRN: [id number] DOB: 03/07/1956 Today's Date: 04/05/2021 Time: 4097-3532 SLP Time Calculation (min) (ACUTE ONLY): 16 min  Assessment / Plan / Recommendation Clinical Impression  Patient seen for f/u diet tolerance assessment/diagnostic treatment to determine potential need for instrumental testing based on initial evaluation completed 11/17. Patient woken from sleep, pleasant but remained mildly lethargic during treatment requiring min-moderate verbal and tactile cues to maintain adequate level of alertness for po intake. Left mitten removed to facilitate self feeding. Patient able to self feed clinician provided diagnostic po trials with moderate tactile assistance, consuming thin liquids, pureed solids, and mechanical soft solids without overt indication of a pharyngeal dysphagia or aspiration. Oral phase remains prolonged but functional with full oral clearance. Self feeding may assist to improve awareness and hasten oral phase of swallow. Discussed with RN. Patient appropriate to advance diet with continued SLP f/u given lethargy and mentation increasing risk of dysphagia and aspiration. Note prior SPL questioned need for instrumental evaluation given h/o tracheal mass. CT of the chest from 2021 indicated paratracheal soft tissue mass extending from the right apex to the right suprahilar and precarinal region. More recent CT scans do not indicate worsening. Does not appear to be impacting swallowing function. No need for instrumental testing at this time.     HPI HPI: 65yo male admitted 04/01/21, found acting abnormally and confused/aphasic. Seizure in ED. Intubated 11/15-16/22. PMH: tracheal mass, HTN, anemia, bullous emphysema, COPD, non-small cell lung cancer s/p chemo, crani due to brain tumor, right side deficits. CTHead = hypodensity in Left parietal and occipital lobes. MRI = 1.4cm mets in parasaggital Left  parietal lobe.      SLP Plan  Continue with current plan of care      Recommendations for follow up therapy are one component of a multi-disciplinary discharge planning process, led by the attending physician.  Recommendations may be updated based on patient status, additional functional criteria and insurance authorization.    Recommendations  Diet recommendations: Dysphagia 3 (mechanical soft);Thin liquid Liquids provided via: Cup;Straw Medication Administration: Crushed with puree Supervision: Staff to assist with self feeding;Full supervision/cueing for compensatory strategies Compensations: Slow rate;Small sips/bites Postural Changes and/or Swallow Maneuvers: Seated upright 90 degrees                Oral Care Recommendations: Oral care BID Follow Up Recommendations: Skilled nursing-short term rehab (<3 hours/day) Assistance recommended at discharge: Frequent or constant Supervision/Assistance SLP Visit Diagnosis: Dysphagia, oral phase (R13.11) Plan: Continue with current plan of care       [place] MA, [place]  04/05/2021, 11:25 AM

## 2021-04-05 NOTE — TOC Initial Note (Signed)
Transition of Care [facility]) - Initial/Assessment Note    Patient Details  Name: [name] [name] MRN: [id number] Date of Birth: Apr 07, 1956  Transition of Care [facility]) CM/SW Contact:    Joanne Chars, LCSW Phone Number: 04/05/2021, 3:30 PM  Clinical Narrative:    Pt not able to participate in assessment, CSW spoke with [name], [place].  Ms. Elgie Congo reports the following: Pt has a brother and a sister who live out of town and are not able to assist much.  Ms Elgie Congo knows pt from [place] [employer]. She became POA after a surgery this past April and she said the form was completed at [facility].  Pt does live alone.  Pt does have [place] aide services through [employer] care 2 hours per day on Mon, Wed, and Fri.  Current DME in home: walker, shower chair.    Ms. Elgie Congo would be in agreement with plan for pt to go to SNF when medically ready.                 Expected Discharge Plan: [place] Barriers to Discharge: Continued Medical Work up, SNF Pending bed offer   Patient Goals and CMS Choice        Expected Discharge Plan and Services Expected Discharge Plan: [place] In-house Referral: Clinical Social Work   Post Acute Care Choice: [place] Living arrangements for the past 2 months: Apartment                                      Prior Living Arrangements/Services Living arrangements for the past 2 months: Apartment Lives with:: Self Patient language and need for interpreter reviewed:: No        Need for Family Participation in Patient Care: Yes (Comment) Care giver support system in place?: Yes (comment) Current home services: Homehealth aide ([place]) Criminal Activity/Legal Involvement Pertinent to Current Situation/Hospitalization: No - Comment as needed  Activities of Daily Living      Permission Sought/Granted                  Emotional Assessment       Orientation: :  Oriented to Self Alcohol / Substance Use: Not Applicable Psych Involvement: No (comment)  Admission diagnosis:  Respiratory arrest ([place]) [R09.2] Status epilepticus ([place]) [G40.901] Seizures ([place]) [R56.9] Encephalopathy [G93.40] Altered mental status, unspecified altered mental status type [R41.82] Vomiting, unspecified vomiting type, unspecified whether nausea present [R11.10] Patient Active Problem List   Diagnosis Date Noted   Respiratory arrest ([place])    Altered mental status, unspecified altered mental status type 04/02/2021   Seizures ([place]) 04/01/2021   SIRS (systemic inflammatory response syndrome) ([place]) 04/01/2021   Acute encephalopathy 04/01/2021   Neutropenia ([place]) 12/17/2020   Brain mass 08/24/2020   Brain metastasis ([place]) 08/23/2020   Encounter for antineoplastic immunotherapy 04/02/2020   Chemotherapy induced neutropenia ([place]) 02/28/2020   Malignant neoplasm of bronchus of right upper lobe ([place]) 01/03/2020   Encounter for antineoplastic chemotherapy 01/03/2020   Goals of care, counseling/discussion 01/03/2020   Malnutrition of moderate degree 12/26/2019   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   Hypertension 12/24/2019   Hyponatremia 12/24/2019   Tobacco use 11/02/2007   COPD (chronic obstructive pulmonary disease) ([place]) 11/02/2007   PULMONARY INFILTRATE INCLUDES (EOSINOPHILIA) 08/23/2007   PCP:  [name], NP Pharmacy:   [facility]  and [place] [address] Phone: [phone] Fax: [phone]     Social Determinants of Health (SDOH) Interventions    Readmission Risk Interventions Readmission Risk Prevention Plan 09/20/2020  Transportation Screening Complete  PCP or Specialist Appt within 3-5 Days Complete  HRI or [place] Complete  Social Work Consult for [place] Planning/Counseling Complete  Palliative Care Screening Complete  Medication Review Human resources officer) Complete  Some recent data might be hidden

## 2021-04-05 NOTE — Progress Notes (Signed)
[facility] ADULT ICU REPLACEMENT PROTOCOL   The patient does apply for the [facility] Adult ICU Electrolyte Replacment Protocol based on the criteria listed below:   1.Exclusion criteria: TCTS patients, ECMO patients, and Dialysis patients 2. Is GFR >/= 30 ml/min? Yes.    Patient's GFR today is >60  3. Is SCr </= 2? Yes.   Patient's SCr is 0.65 mg/dL 4. Did SCr increase >/= 0.5 in 24 hours? No. 5.Pt's weight >40kg  Yes.   6. Abnormal electrolyte(s): K+ 3.4  7. Electrolytes replaced per protocol 8.  Call MD STAT for K+ </= 2.5, Phos </= 1, or Mag </= 1 Physician:  n/a  Darlys Gales 04/05/2021 6:28 AM

## 2021-04-06 DIAGNOSIS — R569 Unspecified convulsions: Secondary | ICD-10-CM | POA: Diagnosis not present

## 2021-04-06 LAB — CULTURE, BLOOD (ROUTINE X 2)
Culture: NO GROWTH
Culture: NO GROWTH

## 2021-04-06 LAB — BASIC METABOLIC PANEL
Anion gap: 9 (ref 5–15)
BUN: 9 mg/dL (ref 8–23)
CO2: 27 mmol/L (ref 22–32)
Calcium: 8.8 mg/dL — ABNORMAL LOW (ref 8.9–10.3)
Chloride: 96 mmol/L — ABNORMAL LOW (ref 98–111)
Creatinine, Ser: 0.64 mg/dL (ref 0.61–1.24)
GFR, Estimated: >60 mL/min (ref >60)
Glucose, Bld: 107 mg/dL — ABNORMAL HIGH (ref 70–99)
Potassium: 3.7 mmol/L (ref 3.5–5.1)
Sodium: 132 mmol/L — ABNORMAL LOW (ref 135–145)

## 2021-04-06 LAB — CBC WITH DIFFERENTIAL/PLATELET
Abs Immature Granulocytes: 0.02 10*3/uL (ref 0.00–0.07)
Basophils Absolute: 0 10*3/uL (ref 0.0–0.1)
Basophils Relative: 0 %
Eosinophils Absolute: 0 10*3/uL (ref 0.0–0.5)
Eosinophils Relative: 0 %
HCT: 32.8 % — ABNORMAL LOW (ref 39.0–52.0)
Hemoglobin: 10.5 g/dL — ABNORMAL LOW (ref 13.0–17.0)
Immature Granulocytes: 0 %
Lymphocytes Relative: 12 %
Lymphs Abs: 0.6 10*3/uL — ABNORMAL LOW (ref 0.7–4.0)
MCH: 26.3 pg (ref 26.0–34.0)
MCHC: 32 g/dL (ref 30.0–36.0)
MCV: 82.2 fL (ref 80.0–100.0)
Monocytes Absolute: 0.5 10*3/uL (ref 0.1–1.0)
Monocytes Relative: 8 %
Neutro Abs: 4.3 10*3/uL (ref 1.7–7.7)
Neutrophils Relative %: 80 %
Platelets: 297 10*3/uL (ref 150–400)
RBC: 3.99 MIL/uL — ABNORMAL LOW (ref 4.22–5.81)
RDW: 15.6 % — ABNORMAL HIGH (ref 11.5–15.5)
WBC: 5.4 10*3/uL (ref 4.0–10.5)
nRBC: 0 % (ref 0.0–0.2)

## 2021-04-06 LAB — GLUCOSE, CAPILLARY
Glucose-Capillary: 113 mg/dL — ABNORMAL HIGH (ref 70–99)
Glucose-Capillary: 140 mg/dL — ABNORMAL HIGH (ref 70–99)

## 2021-04-06 LAB — MAGNESIUM: Magnesium: 1.6 mg/dL — ABNORMAL LOW (ref 1.7–2.4)

## 2021-04-06 MED ORDER — BETHANECHOL CHLORIDE 10 MG PO TABS
10.0000 mg | ORAL_TABLET | Freq: Three times a day (TID) | ORAL | Status: DC
  Administered 2021-04-06 – 2021-04-15 (×29): 10 mg via ORAL
  Filled 2021-04-06 (×29): qty 1

## 2021-04-06 MED ORDER — POTASSIUM CHLORIDE CRYS ER 20 MEQ PO TBCR
40.0000 meq | EXTENDED_RELEASE_TABLET | Freq: Once | ORAL | Status: AC
  Administered 2021-04-06: 40 meq via ORAL
  Filled 2021-04-06: qty 2

## 2021-04-06 MED ORDER — IPRATROPIUM-ALBUTEROL 0.5-2.5 (3) MG/3ML IN SOLN: 3.0000 mL | RESPIRATORY_TRACT | Status: DC | PRN

## 2021-04-06 MED ORDER — MAGNESIUM SULFATE 4 GM/100ML IV SOLN
4.0000 g | Freq: Once | INTRAVENOUS | Status: AC
  Administered 2021-04-06: 4 g via INTRAVENOUS
  Filled 2021-04-06: qty 100

## 2021-04-06 NOTE — Progress Notes (Signed)
[facility] ADULT ICU REPLACEMENT PROTOCOL   The patient does apply for the [facility] Adult ICU Electrolyte Replacment Protocol based on the criteria listed below:   1.Exclusion criteria: TCTS patients, ECMO patients, and Dialysis patients 2. Is GFR >/= 30 ml/min? Yes.    Patient's GFR today is >60 3. Is SCr </= 2? Yes.   Patient's SCr is 0.64 mg/dL 4. Did SCr increase >/= 0.5 in 24 hours? No. 5.Pt's weight >40kg  Yes.   6. Abnormal electrolyte(s): mag 1.6, K+ 3.7  7. Electrolytes replaced per protocol 8.  Call MD STAT for K+ </= 2.5, Phos </= 1, or Mag </= 1 Physician:  n/a  Darlys Gales 04/06/2021 5:55 AM

## 2021-04-06 NOTE — Progress Notes (Addendum)
NAME:  [name] [name], MRN:  [id number], DOB:  [birth year], LOS: 4 ADMISSION DATE:  04/01/2021, CONSULTATION DATE:  04/02/21 REFERRING MD:  Alcario Drought, CHIEF COMPLAINT:  seizures  History of Present Illness:  65 yo man with hx COPD, metastatic nsclc treated with chemo and immunotherapy, hx of crani, HTN, of R sided deficits from hx of brain tumor,  Brought to ED after being found outdoors  confused and aphasic by neighbors, presented with tremors, aphasia.   Seen by home health in am, acting somewhat confused but not aphasic.  Normal at 5 pm 11/13 pm per family.    On admit to ED, tachycardia tachypnea.  Started on vanc and cefepime and flagyl Ativan1mg  x 1,  2L LR, and  2 spells of RLE jerking in CT.  Neurology thought findings 2/2 partial complex seizure vs stroke.  Stereotatereotactic radiosurgery to brain x6 days ago.  Radiosurgery to brain x6 days ago.  Vomited while altered, became hypoxic, repeated emesis, was intubated by EDP.   Pertinent  Medical History  Emphysema  NSCLC, metastatic  HTN Iron deficiency anemia Seizure hx, brain mets ( on keppra at home) Tracheal mass   Significant Hospital Events: Including procedures, antibiotic start and stop dates in addition to other pertinent events   Admitted 11/14 with seizure,MRI-> metastatis parasagittal left parietal lobe with surrounding vasogenic edema without MLS, decadron started; acute respiratory insufficiency, vomited-> hypoxic, intubated, cefepime/ vanc 11/15 changed to cEEG, not f/c, gross hematuria-> urology consulted, catheter changes/ bladder irrigated 11/16 extubated Occasional agitation still requiring restraints and prn ativan  Interim History / Subjective:   Extubated successfully 11/16 On supplemental oxygen No seizures No overnight events  Objective   Blood pressure 112/67, pulse 96, temperature 98.4 F (36.9 C), temperature source Oral, resp. rate 20, height 5' (1.524 m), weight 58 kg, SpO2 96 %.         Intake/Output Summary (Last 24 hours) at 04/06/2021 0721 Last data filed at 04/06/2021 0600 Gross per 24 hour  Intake 759.94 ml  Output 1975 ml  Net -1215.06 ml   Filed Weights   04/04/21 0500 04/05/21 0400 04/06/21 0400  Weight: 56.8 kg 58.5 kg 58 kg   Examination: General: No overnight events, calm, interactive HEENT: Dry oral mucosa Neuro: Pupils reacting to light and accommodation CV: S1-S2 appreciated with no murmur PULM: Decreased air movement bilaterally GI: Bowel sounds appreciated Extremities: No edema Skin: No rash  11/14 SARs/ flu > neg 11/15 MRSA pcr > neg 11/14 Bcx2 > 11/14 UC > negative  11/14 vanc > 11/16 11/14 cefepime > 11/16 11/14 flagyl 11/16 ceftriaxone >>  CT head 10/26: IMPRESSION: New 1.1 cm metastasis of the parasagittal left parietal lobe with mild edema. No evidence of recurrent disease at the operative site.  CXR: gastric tube needs advancing, ET tube fine, left lower lobe lesion, no new consolidation or abnormality   MRI  IMPRESSION: 1. Technically limited exam due to extensive motion artifact. 2. 1.4 cm metastasis involving the parasagittal left parietal lobe. Surrounding vasogenic edema without midline shift. 3. Diffuse dural thickening and enhancement about the adjacent craniotomy site, favored to be postoperative in nature, although attention at follow-up is recommended. 4. Question subtly increased diffusion signal abnormality at the left thalamus. Although this finding is not entirely certain given motion artifact on this exam, possible subtle changes of ischemia or possibly seizure could be considered. Additional mildly increased diffusion signal within the adjacent left parieto-occipital region could also reflect changes of acute seizure. Correlation with EEG  recommended. 5. No other acute intracranial abnormality  11/15 CT abd/ pelvis >> 1. Negative for pneumoperitoneum or bowel abnormality. 2. But positive for  asymmetric delayed nephrograms (worse on the left) with abundant abnormal pararenal space stranding and/or fluid.  But no hydronephrosis or obstructing lesion. Bladder decompressed by Foley catheter, although with intermediate density filling defect in the bladder lumen. Constellation suggests Acute Intrinsic Renal Failure. Query hematuria. Urinalysis recommended. 3. Positive also for patchy opacity in the lateral basal segment of the right lower lobe suspicious for Acute Bronchopneumonia. And trace pleural effusions are new since July. 4. And also new small volume pericardial effusion with possible complex fluid density. Query Pericarditis. 5. Enteric tube terminates in the gastric body  11/15 TTE >> Moderate pericardial effusion that is apical and anterior to the RV.  Largest at 17 mm. There is no RV or RA collapse.  LVEF 60%, no RWA, indeterminate diastolic filling, normal RV function  Labs significant for hypomagnesemia-being replaced  Resolved Hospital Problem list    Assessment & Plan:   Seizures Brain metastases s/p craniectomy 4/22 with stereotactic radiation surgery/chemo vasogenic edema on MRI SRS on 11/8 No ongoing seizures -At risk of continuing seizures -Continue Keppra -Load with Vimpat if needed -Continue dexamethasone  Respiratory failure related to right lower lobe aspiration pneumonia-being treated Metastatic non-small cell lung cancer -On chemotherapy/SRS -Last chemo 10/17-Alimta and Keytruda [every 3 weeks] -Extubated 11/16, on oxygen supplementation  History of chronic obstructive pulmonary disease -Continue pulmonary hygiene -Continue bronchodilators  Aspiration pneumonia -To complete 7 days of antibiotics, currently on ceftriaxone  Intermittent agitation -Started low-dose Klonopin  Hematuria -Resolved -Lovenox restarted -Foley in place -Will require urology follow-up following discharge for possible cystoscopy  Pericardial effusion -Follow-up  echocardiogram as outpatient -Not currently compromising cardiac function  Coffee-ground emesis on admission -Continue PPI as patient remains on steroids  Speech evaluation noted -We will advance diet as tolerated  Attempted voiding trial today -Bethanechol added  Best Practice (right click and "Reselect all SmartList Selections" daily)   Diet/type: dysphagia diet (see orders) -we will advance if tolerated DVT prophylaxis: LMWH  GI prophylaxis: PPI Lines: N/A has port right chest Foley:  Yes, and it is still needed Code Status:  full code Last date of multidisciplinary goals of care discussion []  POA Peggy at bedside and updated on 11/17  Labs   CBC: Recent Labs  Lab 04/01/21 1859 04/01/21 1936 04/02/21 0503 04/02/21 0819 04/02/21 2000 04/03/21 0344 04/04/21 0830 04/05/21 0500 04/06/21 0500  WBC 5.6  --  10.8*  --   --  9.5 10.2 6.2 5.4  NEUTROABS 3.8  --  9.9*  --   --   --   --   --  4.3  HGB 11.9*   < > 12.1*   < > 9.8* 9.7* 10.2* 9.3* 10.5*  HCT 37.8*   < > 38.2*   < > 30.4* 30.8* 32.5* 30.5* 32.8*  MCV 84.6  --  85.7  --   --  85.3 84.0 85.0 82.2  PLT 238  --  256  --   --  229 268 248 297   < > = values in this interval not displayed.    Basic Metabolic Panel: Recent Labs  Lab 04/01/21 1859 04/01/21 1936 04/02/21 0503 04/02/21 0819 04/02/21 1412 04/03/21 0344 04/04/21 0515 04/05/21 0500 04/06/21 0500  NA 137   < > 133* 138  --  137 138 140 132*  K 3.7   < > 3.8 4.1  --  3.7 4.0 3.4* 3.7  CL 105   < > 100  --   --  106 105 104 96*  CO2 19*   < > 23  --   --  23 27 30 27   GLUCOSE 90   < > 129*  --   --  128* 91 102* 107*  BUN 5*   < > 5*  --   --  9 7* 7* 9  CREATININE 0.76   < > 0.76  --   --  0.79 0.66 0.65 0.64  CALCIUM 9.4   < > 8.9  --   --  8.5* 9.2 9.0 8.8*  MG 1.5*  --   --   --  1.8 1.8  --   --  1.6*  PHOS  --   --   --   --  3.4 3.1 2.4* 2.9  --    < > = values in this interval not displayed.   GFR: Estimated Creatinine Clearance: 66  mL/min (by C-G formula based on SCr of 0.64 mg/dL). Recent Labs  Lab 04/01/21 1905 04/01/21 2105 04/02/21 0253 04/02/21 0503 04/03/21 0344 04/04/21 0830 04/05/21 0500 04/06/21 0500  PROCALCITON  --   --  0.15  --   --   --   --   --   WBC  --   --   --  10.8* 9.5 10.2 6.2 5.4  LATICACIDVEN 2.2* 1.9  --  1.1  --   --   --   --     Liver Function Tests: Recent Labs  Lab 04/01/21 1859 04/03/21 0344 04/04/21 0515 04/05/21 0500  AST 27  --   --   --   ALT 12  --   --   --   ALKPHOS 53  --   --   --   BILITOT 0.4  --   --   --   PROT 6.7  --   --   --   ALBUMIN 3.4* 2.6* 2.8* 2.6*   No results for input(s): LIPASE, AMYLASE in the last 168 hours. Recent Labs  Lab 04/01/21 1859  AMMONIA 26    ABG    Component Value Date/Time   PHART 7.391 04/02/2021 0819   PCO2ART 41.9 04/02/2021 0819   PO2ART 136 (H) 04/02/2021 0819   HCO3 25.3 04/02/2021 0819   TCO2 27 04/02/2021 0819   ACIDBASEDEF 2.0 04/02/2021 0344   O2SAT 99.0 04/02/2021 0819     Coagulation Profile: Recent Labs  Lab 04/01/21 2000 04/02/21 0503  INR 1.0 1.0    Cardiac Enzymes: Recent Labs  Lab 04/02/21 0503  CKTOTAL 178    HbA1C: Hemoglobin A1C  Date/Time Value Ref Range Status  05/31/2018 04:54 PM 5.6 4.0 - 5.6 % Final   Hgb A1c MFr Bld  Date/Time Value Ref Range Status  05/29/2008 09:43 PM  4.6 - 6.1 % Final   5.7 (NOTE)   The ADA recommends the following therapeutic goal for glycemic   control related to Hgb A1C measurement:   Goal of Therapy:   < 7.0% Hgb A1C   Reference: American Diabetes Association: Clinical Practice   Recommendations 2008, Diabetes Care,  2008, 31:(Suppl 1).    CBG: Recent Labs  Lab 04/05/21 0759 04/05/21 1129 04/05/21 1534 04/05/21 1920 04/05/21 2309  GLUCAP 89 111* 84 116* 105*    The patient is critically ill with multiple organ systems failure and requires high complexity decision making for assessment and support, frequent evaluation  and titration of  therapies, application of advanced monitoring technologies and extensive interpretation of multiple databases. Critical Care Time devoted to patient care services described in this note independent of APP/resident time (if applicable)  is 31 minutes.   Sherrilyn Rist MD [facility] Personal pager: See Amion If unanswered, please page CCM On-call: [phone]

## 2021-04-06 NOTE — Progress Notes (Signed)
Notified urology that found the foley came apart from the foley bag, replaced foley bag per MD direction.

## 2021-04-07 DIAGNOSIS — J438 Other emphysema: Secondary | ICD-10-CM

## 2021-04-07 DIAGNOSIS — J690 Pneumonitis due to inhalation of food and vomit: Secondary | ICD-10-CM | POA: Diagnosis not present

## 2021-04-07 DIAGNOSIS — J9601 Acute respiratory failure with hypoxia: Secondary | ICD-10-CM | POA: Diagnosis not present

## 2021-04-07 DIAGNOSIS — G9341 Metabolic encephalopathy: Secondary | ICD-10-CM

## 2021-04-07 DIAGNOSIS — G40201 Localization-related (focal) (partial) symptomatic epilepsy and epileptic syndromes with complex partial seizures, not intractable, with status epilepticus: Secondary | ICD-10-CM | POA: Diagnosis present

## 2021-04-07 DIAGNOSIS — I3139 Other pericardial effusion (noninflammatory): Secondary | ICD-10-CM | POA: Diagnosis present

## 2021-04-07 DIAGNOSIS — R310 Gross hematuria: Secondary | ICD-10-CM | POA: Diagnosis not present

## 2021-04-07 MED ORDER — TAMSULOSIN HCL 0.4 MG PO CAPS
0.4000 mg | ORAL_CAPSULE | Freq: Every day | ORAL | Status: DC
  Administered 2021-04-07 – 2021-04-14 (×8): 0.4 mg via ORAL
  Filled 2021-04-07 (×8): qty 1

## 2021-04-07 NOTE — Hospital Course (Addendum)
[name] [name] is a 65 y.o. 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   In the ER, was post-ictal and neurology noted focal seizure activity. Then vomited, developed respiratory failure and was intubated.     11/14: Admitted to medicine initially for status epilepticus, then vomited, had respiratory failure, intubated and transferred to ICU 11/15: cEEG started, gross hematuria noted 11/16: extubated 11/20: transferred out of unit to [facility] 11/21: continuing to slowly improve; discussed with Dr. Mickeal Skinner, Neuro-oncology, and Dr. Hortense Ramal, epileptology, his current deficits are likely related to prolonged post-ictal state and should resolve

## 2021-04-07 NOTE — Assessment & Plan Note (Signed)
Blood pressure controlled off medicines

## 2021-04-07 NOTE — Assessment & Plan Note (Signed)
-  Continue dexamethasone q6

## 2021-04-07 NOTE — Assessment & Plan Note (Signed)
-   Follow-up echo in 1 month

## 2021-04-07 NOTE — Assessment & Plan Note (Signed)
Resolved

## 2021-04-07 NOTE — Assessment & Plan Note (Addendum)
No further seizure activity - Continue Keppra

## 2021-04-07 NOTE — Assessment & Plan Note (Signed)
-   Continue Foley catheter - Continue bethanechol - Start Flomax - Voiding trial tomorrow

## 2021-04-07 NOTE — Assessment & Plan Note (Signed)
Completed antibiotic

## 2021-04-07 NOTE — Assessment & Plan Note (Signed)
Dx'd IIIA NSCLC 12/2019, got chemorad with Carboplatin/paclitaxel to Imfinzi consolidation, the had disease progression in Mar 22 when left parietal brain met was noted.  Underwent Craniectomy by Dr. Kathyrn Sheriff in Apr 2022.  Post craniectomy on Alimta and Keytruda until Oct 27 surveillance scan showed new brain met.  Restarted radiation therapy on 11/8.

## 2021-04-07 NOTE — Assessment & Plan Note (Signed)
No active flare - Continue pantoprazole

## 2021-04-07 NOTE — Progress Notes (Signed)
Progress Note    [name] [name]   BMW:[id number]  DOB: 19-May-1956  DOA: 04/01/2021     5 Date of Service: 04/07/2021      Brief summary: [name] [name] is a 65 y.o. 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   In the ER, was post-ictal and neurology noted focal seizure activity. Then vomited, developed respiratory failure and was intubated.     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 ([place]) No further seizure activity - Continue Keppra  Aspiration pneumonia of right lower lobe (HCC) Completed antibiotic  Brain metastasis ([place]) -Continue dexamethasone q6  Acute respiratory failure with hypoxia ([place]) Resolved  Acute metabolic encephalopathy At baseline the patient lives independently, here he is quite confused, will require significant rehabilitation  Pericardial effusion - Follow-up echo in 1 month  Gross hematuria - Continue Foley catheter - Continue bethanechol - Start Flomax - Voiding trial tomorrow  Malignant neoplasm of bronchus of right upper lobe ([place]) Dx'd IIIA NSCLC 12/2019, got chemorad with Carboplatin/paclitaxel to Imfinzi consolidation, the had disease progression in Mar 22 when left parietal brain met was noted.  Underwent Craniectomy by Dr. Kathyrn Sheriff in Apr 2022.  Post craniectomy on Alimta and Keytruda until Oct 27 surveillance scan showed new brain met.  Restarted radiation therapy on 11/8.  Hypertension Blood pressure controlled off medicines  COPD (chronic obstructive pulmonary disease) (HCC) No active flare - Continue pantoprazole  Malnutrition of moderate degree        Subjective:  No headache, no seizures, no chest pain, no dyspnea, no cough.  Objective Vitals:   04/06/21 2347 04/07/21 0350 04/07/21 0522 04/07/21 0800  BP: 98/63 114/71  113/74  Pulse: 90 87  97  Resp: 17 19    Temp: 98 F (36.7 C) 98.6 F (37 C)  98 F (36.7 C)  TempSrc:    Oral  SpO2: 96% 98%    Weight:   58.3 kg   Height:       58.3 kg  Vital signs were reviewed and were notable for blood pressure low normal, elevated heart rate slightly   Exam General appearance: Adult male, sitting up in bed, interactive, no acute distress     HEENT: Anicteric, conjunctival pink, lids and lashes normal.  No nasal deformity, discharge, or epistaxis.  Oropharynx moist, no oral lesions, dentition in adequate repair, lips normal Skin: No suspicious rashes or lesions Cardiac: Slightly tachycardic, regular rhythm, no murmurs, no JVD, no lower extremity edema Respiratory: Respiratory rate and rhythm, lungs clear without rales or wheeze Abdomen: Abdomen soft without tenderness palpation, no guarding MSK:  Neuro: Awake and alert, makes eye contact, moves upper extremities with symmetric strength.  Bilateral lower extremities are quite weak, but symmetric.  Speech is fluent, but he is forgetful of words and tends to trail off and can not really complete a sentence. Psych: Attention normal to me but tends to trail off and cannot complete a sentence.  Affect pleasant.  Judgment insight appear markedly impaired.  Thinks he is at [facility], really unable to state that he is in [place] or [place].    Labs / Other Information My review of  labs, imaging, notes and other tests is significant for Sodium 132, glucose controlled, hemoglobin stable     Disposition Plan: Status is: Inpatient  Remains inpatient appropriate because: He presented with status epilepticus, and has some residual cognitive impairment, hopefully this is postictal and he will gradually improve and will be  able to transition to home health or skilled rehab within the next 24 to 48 hours, however treatment plans with oncology will need to be sorted out.  Spoke to [employer] by phone       Time spent: 35 minutes [facility] 04/07/2021, 3:54 PM

## 2021-04-07 NOTE — Assessment & Plan Note (Signed)
At baseline the patient lives independently, here he is quite confused, will require significant rehabilitation

## 2021-04-08 ENCOUNTER — Telehealth: Payer: Self-pay | Admitting: Medical Oncology

## 2021-04-08 DIAGNOSIS — Z7189 Other specified counseling: Secondary | ICD-10-CM | POA: Diagnosis not present

## 2021-04-08 DIAGNOSIS — J9601 Acute respiratory failure with hypoxia: Secondary | ICD-10-CM | POA: Diagnosis not present

## 2021-04-08 DIAGNOSIS — Z515 Encounter for palliative care: Secondary | ICD-10-CM | POA: Diagnosis not present

## 2021-04-08 DIAGNOSIS — R569 Unspecified convulsions: Secondary | ICD-10-CM | POA: Diagnosis not present

## 2021-04-08 DIAGNOSIS — C7931 Secondary malignant neoplasm of brain: Principal | ICD-10-CM

## 2021-04-08 DIAGNOSIS — J690 Pneumonitis due to inhalation of food and vomit: Secondary | ICD-10-CM | POA: Diagnosis not present

## 2021-04-08 DIAGNOSIS — J438 Other emphysema: Secondary | ICD-10-CM | POA: Diagnosis not present

## 2021-04-08 DIAGNOSIS — G9341 Metabolic encephalopathy: Secondary | ICD-10-CM | POA: Diagnosis not present

## 2021-04-08 LAB — BASIC METABOLIC PANEL
Anion gap: 7 (ref 5–15)
BUN: 21 mg/dL (ref 8–23)
CO2: 25 mmol/L (ref 22–32)
Calcium: 9 mg/dL (ref 8.9–10.3)
Chloride: 104 mmol/L (ref 98–111)
Creatinine, Ser: 0.7 mg/dL (ref 0.61–1.24)
GFR, Estimated: >60 mL/min (ref >60)
Glucose, Bld: 112 mg/dL — ABNORMAL HIGH (ref 70–99)
Potassium: 3.9 mmol/L (ref 3.5–5.1)
Sodium: 136 mmol/L (ref 135–145)

## 2021-04-08 LAB — CBC
HCT: 33.3 % — ABNORMAL LOW (ref 39.0–52.0)
Hemoglobin: 10.8 g/dL — ABNORMAL LOW (ref 13.0–17.0)
MCH: 26.5 pg (ref 26.0–34.0)
MCHC: 32.4 g/dL (ref 30.0–36.0)
MCV: 81.8 fL (ref 80.0–100.0)
Platelets: 296 10*3/uL (ref 150–400)
RBC: 4.07 MIL/uL — ABNORMAL LOW (ref 4.22–5.81)
RDW: 15.4 % (ref 11.5–15.5)
WBC: 6 10*3/uL (ref 4.0–10.5)
nRBC: 0 % (ref 0.0–0.2)

## 2021-04-08 NOTE — TOC Progression Note (Addendum)
Transition of Care [facility]) - Progression Note    Patient Details  Name: [name] [name] MRN: [id number] Date of Birth: Jan 31, 1956  Transition of Care [facility]) CM/SW [place], [place] Phone Number: 04/08/2021, 2:52 PM  Clinical Narrative:    CSW received a voicemail from [place] at Dr. Worthy Flank office [phone]) and requested a call back to answer questions about pt's care plan moving forward. CSW called back and left a message with the oncoming CSW's phone number. TOC will continue to follow for DC needs.  CSW was notified by MD that pt is medically stable pending SNF placement. [facility] has offered, but is requiring more information regarding chemo therapy including where it is done and how often. They have concerns about the cost of treatment and transportation. MD was unable to answer chemo specific questions and noted he would have Dr. Worthy Flank nurse follow up with CSW. CSW will follow up with facility after a call is received. TOC will continue to follow.   Expected Discharge Plan: Skilled Nursing Facility Barriers to Discharge: Continued Medical Work up, SNF Pending bed offer  Expected Discharge Plan and Services Expected Discharge Plan: [place] In-house Referral: Clinical Social Work   Post Acute Care Choice: [place] Living arrangements for the past 2 months: Apartment                                       Social Determinants of Health (SDOH) Interventions    Readmission Risk Interventions Readmission Risk Prevention Plan 09/20/2020  Transportation Screening Complete  PCP or Specialist Appt within 3-5 Days Complete  HRI or [place] Complete  Social Work Consult for [place] Planning/Counseling Complete  Palliative Care Screening Complete  Medication Review Press photographer) Complete  Some recent data might be hidden

## 2021-04-08 NOTE — Assessment & Plan Note (Signed)
Resolved

## 2021-04-08 NOTE — Progress Notes (Signed)
Subjective: No further seizures  ROS: negative except above  Examination  Vital signs in last 24 hours: Temp:  [97.3 F (36.3 C)-98.8 F (37.1 C)] 97.3 F (36.3 C) (11/21 0803) Pulse Rate:  [88-102] 102 (11/21 0803) Resp:  [14-16] 14 (11/21 0803) BP: (97-135)/(64-83) 102/64 (11/21 0803) SpO2:  [98 %-100 %] 100 % (11/21 0803)  General: lying in bed, NAD CVS: pulse-normal rate and rhythm RS: CTAB, on [place] Extremities: warm, no edema Neuro: AOx3, no evidence of aphasia or dysarthria, mild attention deficit, cranial nerves II to XII appear grossly intact, 5/5 in bilateral upper extremities, 4/5 in left lower extremity, 3/5 in right lower extremity (baseline per patient)   Basic Metabolic Panel: Recent Labs  Lab 04/01/21 1859 04/01/21 1936 04/02/21 1412 04/03/21 0344 04/04/21 0515 04/05/21 0500 04/06/21 0500 04/08/21 0244  NA 137   < >  --  137 138 140 132* 136  K 3.7   < >  --  3.7 4.0 3.4* 3.7 3.9  CL 105   < >  --  106 105 104 96* 104  CO2 19*   < >  --  23 27 30 27 25   GLUCOSE 90   < >  --  128* 91 102* 107* 112*  BUN 5*   < >  --  9 7* 7* 9 21  CREATININE 0.76   < >  --  0.79 0.66 0.65 0.64 0.70  CALCIUM 9.4   < >  --  8.5* 9.2 9.0 8.8* 9.0  MG 1.5*  --  1.8 1.8  --   --  1.6*  --   PHOS  --   --  3.4 3.1 2.4* 2.9  --   --    < > = values in this interval not displayed.    CBC: Recent Labs  Lab 04/01/21 1859 04/01/21 1936 04/02/21 0503 04/02/21 0819 04/03/21 0344 04/04/21 0830 04/05/21 0500 04/06/21 0500 04/08/21 0244  WBC 5.6  --  10.8*  --  9.5 10.2 6.2 5.4 6.0  NEUTROABS 3.8  --  9.9*  --   --   --   --  4.3  --   HGB 11.9*   < > 12.1*   < > 9.7* 10.2* 9.3* 10.5* 10.8*  HCT 37.8*   < > 38.2*   < > 30.8* 32.5* 30.5* 32.8* 33.3*  MCV 84.6  --  85.7  --  85.3 84.0 85.0 82.2 81.8  PLT 238  --  256  --  229 268 248 297 296   < > = values in this interval not displayed.     Coagulation Studies: No results for input(s): LABPROT, INR in the last 72  hours.  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   Focal nonconvulsive status epilepticus (resolved) - No further seizures.   Recommendation -Continue Keppra 1000 mg daily (was on 500 mg twice daily at home) -Continue seizure precautions -Management of rest of comorbidities per primary team -Follow-up with neurology in 6 to 8 weeks after discharge.    I have spent a total of 26 minutes with the patient reviewing hospital notes,  test results, labs and examining the patient as well as establishing an assessment and plan that was discussed personally with the patient.  > 50% of time was spent in direct patient care.    Zeb Comfort  Epilepsy [facility] For questions after 5pm please refer to AMION to reach the Neurologist on call

## 2021-04-08 NOTE — Progress Notes (Signed)
Nutrition Follow-up  DOCUMENTATION CODES:   Non-severe (moderate) malnutrition in context of chronic illness  INTERVENTION:  -Continue Ensure Enlive po BID, each supplement provides 350 kcal and 20 grams of protein  NUTRITION DIAGNOSIS:   Moderate Malnutrition related to chronic illness, cancer and cancer related treatments as evidenced by moderate fat depletion, moderate muscle depletion, percent weight loss.  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  11/14: Admitted to medicine initially for status epilepticus, then vomited, had respiratory failure, intubated and transferred to ICU 11/15: cEEG started, gross hematuria noted 11/16: extubated 11/20: transferred out of unit to [facility] 11/21: continuing to slowly improve; discussed with Dr. Mickeal Skinner, Neuro-oncology, and Dr. Hortense Ramal, epileptology, his current deficits are likely related to prolonged post-ictal state and should resolve   Per MD, pt without changes to mental status overnight. Pt continues to have weakness and difficulty with speech and walking. Per MD, "Patient will require significant rehabilitation to return to an independent level of function, at this point he is medically cleared for discharge on his new seizure regimen, will need rehabilitation and placement is pending"  Pt reports good appetite. RN reports varied supplement acceptance. Continue current plan of care as pt awaits discharge to rehab.   PO Intake: 60-100% x last 8 recorded meals (92% avg meal intake)   UOP: 141ml x24 hours I/O: +518ml since  admit  Current weight: 58.3 kg Admit weight: 56 kg   Medications:  bethanechol  10 mg Oral TID   chlorhexidine  15 mL Mouth Rinse BID   Chlorhexidine Gluconate Cloth  6 each Topical Daily   clonazepam  0.25 mg Oral BID   dexamethasone (DECADRON) injection  4 mg Intravenous Q6H   enoxaparin (LOVENOX) injection  40 mg Subcutaneous Q24H   feeding supplement  237 mL Oral BID BM   levETIRAcetam  1,000 mg Oral BID   mouth rinse  15 mL Mouth Rinse q12n4p   pantoprazole  40 mg Oral Q1200   polyethylene glycol  17 g Oral Daily   tamsulosin  0.4 mg Oral QPC supper   Labs: Recent Labs  Lab 04/02/21 1412 04/03/21 0344 04/04/21 0515 04/05/21 0500 04/06/21 0500 04/08/21 0244  NA  --  137 138 140 132* 136  K  --  3.7 4.0 3.4* 3.7 3.9  CL  --  106 105 104 96* 104  CO2  --  23 27 30 27 25   BUN  --  9 7* 7* 9 21  CREATININE  --  0.79 0.66 0.65 0.64 0.70  CALCIUM  --  8.5* 9.2 9.0 8.8* 9.0  MG 1.8 1.8  --   --  1.6*  --   PHOS 3.4 3.1 2.4* 2.9  --   --   GLUCOSE  --  128* 91 102* 107* 112*   Diet Order:   Diet Order             DIET DYS 3 Room service appropriate? Yes; Fluid consistency: Thin  Diet effective now                   EDUCATION NEEDS:  No education needs have been identified at this time  Skin:  Skin Assessment: Reviewed RN Assessment  Last BM:  11/20 type 6  Height:   Ht Readings from Last 1 Encounters:  04/02/21 5' (1.524 m)    Weight:   Wt Readings from Last 1 Encounters:  04/07/21 58.3 kg    BMI:  Body mass index is 25.1 kg/m.  Estimated Nutritional Needs:   Kcal:  1700-1900  Protein:  85-95g  Fluid:  >1.7L     Theone Stanley., MS, RD, LDN (she/her/hers) RD pager number and weekend/on-call pager number located in [place].

## 2021-04-08 NOTE — Assessment & Plan Note (Signed)
-   Continue dexamethasone , taper to BID

## 2021-04-08 NOTE — Assessment & Plan Note (Signed)
Admitted and Keppra increased.  cEEG completed that showed no further subclinical seizures. - Continue Keppra

## 2021-04-08 NOTE — Progress Notes (Addendum)
Physical Therapy Treatment Patient Details Name: [name] [name] MRN: [id number] DOB: July 01, 1955 Today's Date: 04/08/2021   History of Present Illness 65 y/o male presented to ED on 11/14 after being found outside by neighbor acting abnormally and confused. Recently underwent stereotactic radiosurgery to brain x 6 days ago. CT head showed hypodensity in L parietal and occipital lobes. MRI showed 1.4 cm metastsis involving parasagittal L parietal lobe. Seizure activity in ED. No seizures noted on EEG. Intubated 11/15-11/16. PMH: HTN, anemia, bullous emphysema, COPD, and non-small cell lung cancer status post chemoradiation and now on immunotherapy, hx of crani and R sided deficits from hx of brain tumor.    PT Comments    Pt received in supine and pleasantly agreeable to therapy session. Pt demonstrates improved initiation in bed mobility and needs less assist for transfers (minA) compared with previous session. Pt able to perform gait trial with RW support and chair follow for safety with up to [place] for household distance task with heavy emphasis on safe step sequencing and posture. Discussed with supervising PT, [name]., that pt has met goals and PT has updated goals for further progress. Continue to recommend SNF upon DC. Pt continues to benefit from PT services to progress toward functional mobility goals.      Recommendations for follow up therapy are one component of a multi-disciplinary discharge planning process, led by the attending physician.  Recommendations may be updated based on patient status, additional functional criteria and insurance authorization.  Follow Up Recommendations  Skilled nursing-short term rehab (<3 hours/day)     Assistance Recommended at Discharge Frequent or constant Supervision/Assistance  Equipment Recommendations  Other (comment) (TBD)    Recommendations for Other Services       Precautions / Restrictions Precautions Precautions:  Fall Restrictions Weight Bearing Restrictions: No     Mobility  Bed Mobility Overal bed mobility: Needs Assistance Bed Mobility: Supine to Sit    Supine to sit: Min assist    General bed mobility comments: up to minA for BLE management, use of rail, increased time    Transfers Overall transfer level: Needs assistance Equipment used: Rolling walker (2 wheels) Transfers: Sit to/from Stand Sit to Stand: Min assist   General transfer comment: up to minA for safe hand placement and steadying once standing    Ambulation/Gait Ambulation/Gait assistance: Mod assist;+2 safety/equipment (chair follow) Gait Distance (Feet): 50 Feet Assistive device: Rolling walker (2 wheels) Gait Pattern/deviations: Step-through pattern;Scissoring;Drifts right/left;Trunk flexed;Narrow base of support Gait velocity: decreased     General Gait Details: Pt requiring heavy cues for step sequencing, tendency to adduct, almost scissoring legs and to keep upright posture and navigate RW       Balance Overall balance assessment: Needs assistance Sitting-balance support: No upper extremity supported;Feet supported Sitting balance-Leahy Scale: Fair    Standing balance support: Bilateral upper extremity supported;During functional activity;Reliant on assistive device for balance Standing balance-Leahy Scale: Poor Standing balance comment: static standing balance poor/fair, dynamic standing balance poor         Cognition Arousal/Alertness: Awake/alert Behavior During Therapy: WFL for tasks assessed/performed Overall Cognitive Status: No family/caregiver present to determine baseline cognitive functioning     Current Attention Level: Focused   Following Commands: Follows one step commands with increased time;Follows one step commands consistently Safety/Judgement: Decreased awareness of safety;Decreased awareness of deficits Awareness: Intellectual Problem Solving: Slow processing;Decreased  initiation;Requires verbal cues;Requires tactile cues;Difficulty sequencing General Comments: Follows 1 step commands with increased time  Pertinent Vitals/Pain Pain Assessment: No/denies pain     PT Goals (current goals can now be found in the care plan section) Acute Rehab PT Goals Patient Stated Goal: did not state PT Goal Formulation: Patient unable to participate in goal setting Time For Goal Achievement: 04/18/21 Progress towards PT goals: Progressing toward goals    Frequency    Min 2X/week      PT Plan Current plan remains appropriate       AM-PAC PT "6 Clicks" Mobility   Outcome Measure  Help needed turning from your back to your side while in a flat bed without using bedrails?: A Little Help needed moving from lying on your back to sitting on the side of a flat bed without using bedrails?: A Little Help needed moving to and from a bed to a chair (including a wheelchair)?: A Lot (mod+ cues for all below) Help needed standing up from a chair using your arms (e.g., wheelchair or bedside chair)?: A Lot Help needed to walk in hospital room?: A Lot Help needed climbing 3-5 steps with a railing? : Total 6 Click Score: 13    End of Session Equipment Utilized During Treatment: Gait belt Activity Tolerance: Patient tolerated treatment well Patient left: with call bell/phone within reach;in chair;with chair alarm set Nurse Communication: Mobility status PT Visit Diagnosis: Unsteadiness on feet (R26.81);Muscle weakness (generalized) (M62.81);Difficulty in walking, not elsewhere classified (R26.2);Other symptoms and signs involving the nervous system (R29.898)     Time: 0681-6619 PT Time Calculation (min) (ACUTE ONLY): 23 min  Charges:  $Gait Training: 8-22 mins $Therapeutic Activity: 8-22 mins                     Evelene Croon, Student PTA CI: Carly P., PTA  Carly M Poff 04/08/2021, 5:53 PM

## 2021-04-08 NOTE — Telephone Encounter (Signed)
Dr Loleta Books requested I call Jess for information about chemotherapy. The SNF is having questions about his chemo. I LVM for [facility] to return my call.

## 2021-04-08 NOTE — Consult Note (Signed)
Consultation Note Date: 04/08/2021   Patient Name: [name] [name]  DOB: 14-May-1956  MRN: [id number]  Age / Sex: 65 y.o., male  PCP: [name], NP Referring Physician: Edwin Dada, *  Reason for Consultation: Establishing goals of care  HPI/Patient Profile: 65 y.o. male  with past medical history of COPD, hypertension, metastatic NSCLC with chemo and immunotherapy, craniectomy April 2022, s/p SRS to brain 6 days prior to admission admitted on 04/01/2021 with found outdoors by neighbors with confusion and aphasic with tremors due to recurrent brain mets with vasogenic edema and seizures. Required intubation and successfully extubated 04/03/21. Hospitalization complicated by RLL aspiration pneumonia. Monitoring of moderate sized pericardial effusion.   Clinical Assessment and Goals of Care: I met today at [name] [name]'s bedside but no family/visitors present. He is awake and alert. He answers questions appropriately and oriented to person, place, date. He is able to tell me about what brought him to the hospital. He is able to tell me about his brain surgery and radiation treatment. He is able to tell me about his lung cancer and following with Dr. Julien Nordmann. Although he is noted to have ongoing improvements in his mental status he is unable at this time to correlate his illness and how these events are all connected back to his cancer. He expresses no concerns or worries about his health. He agrees to rehab stay. He expresses concern about keeping his home. He gives me permission to call and speak with his [employer]. He shares that Vickii Chafe and her family (and all his church family) are his main support and help him with whatever he needs.   I called and spoke with Peggy. Peggy and I discussed [name] [name]'s progression of illness and although we hope that he will continue to have improvement overall he continues  with advanced stage cancer that can lead to more complications and decline. Peggy expresses understanding. We discussed the importance of taking opportunity as we are able to have good discussions with [name] [name] about his wishes and acceptable quality of life. Vickii Chafe shares that they have not had these conversations but she understands the importance and also feels comfortable having these conversations with [name] [name]. We discussed continue to hope and pray for improvement but also being prepared for the "what ifs." Peggy agrees that having follow up with outpatient palliative care will be helpful.   All questions/concerns addressed. Emotional support provided.   Primary Decision Maker Patient and HCPOA    SUMMARY OF RECOMMENDATIONS   - No decisions made today but just beginning conversations regarding the importance of advance care planning.   Code Status/Advance Care Planning: Full code   Symptom Management:  Per hospitalist and neurology.   Palliative Prophylaxis:  Aspiration, Bowel Regimen, Delirium Protocol, and Frequent Pain Assessment  Prognosis:  Improving from acute illness but overall prognosis poor with advanced cancer.   Discharge Planning: [facility] for rehab with Palliative care service follow-up      Primary Diagnoses: Present on Admission:  Malignant neoplasm of bronchus of  right upper lobe (HCC)  Brain metastasis ([place])  Acute metabolic encephalopathy  Status 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 ([place])  Malnutrition of moderate degree  COPD (chronic obstructive pulmonary disease) (HCC)  Hypertension  Pericardial effusion   I have reviewed the medical record, interviewed the patient and family, and examined the patient. The following aspects are pertinent.  Past Medical History:  Diagnosis Date   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[place]) 12/25/2019   Cancer ([place])    COPD (chronic obstructive pulmonary disease) ([place]) 11/02/2007   Qualifier: Diagnosis of  By: Melvyn Novas  MD, Christena Deem    Hypertension 12/24/2019   Iron deficiency anemia 08/23/2007   Qualifier: Diagnosis of  By: Jim Like     Social History   Socioeconomic History   Marital status: Single    Spouse name: Not on file   Number of children: Not on file   Years of education: Not on file   Highest education level: Not on file  Occupational History   Not on file  Tobacco Use   Smoking status: Former    Packs/day: 0.50    Types: Cigarettes   Smokeless tobacco: Never   Tobacco comments:    Pt states he stopped since he was discharged from the hospital  Vaping Use   Vaping Use: Never used  Substance and Sexual Activity   Alcohol use: Not Currently   Drug use: Never   Sexual activity: Not Currently  Other Topics Concern   Not on file  Social History Narrative   Not on file   Social Determinants of Health   Financial Resource Strain: Not on file  Food Insecurity: Not on file  Transportation Needs: Not on file  Physical Activity: Not on file  Stress: Not on file  Social Connections: Not on file   Family History  Problem Relation Age of Onset   Hypertension Other    Scheduled Meds:  bethanechol  10 mg Oral TID   chlorhexidine  15 mL Mouth Rinse BID   Chlorhexidine Gluconate Cloth  6 each Topical Daily   clonazepam  0.25 mg Oral BID   dexamethasone (DECADRON) injection  4 mg Intravenous Q6H   enoxaparin (LOVENOX) injection  40 mg Subcutaneous Q24H   feeding supplement  237 mL Oral BID BM   levETIRAcetam  1,000 mg Oral BID   mouth rinse  15 mL Mouth Rinse q12n4p   pantoprazole  40 mg Oral Q1200   polyethylene glycol  17 g Oral Daily   tamsulosin  0.4 mg Oral QPC supper   Continuous Infusions: PRN Meds:.acetaminophen **OR** acetaminophen, docusate, ipratropium-albuterol, labetalol, LORazepam, ondansetron **OR** ondansetron (ZOFRAN) IV Allergies  Allergen Reactions   Other Itching and Other (See Comments)    Seasonal allergies- Itchy eyes, runny nose, sneezing,  scratchy throat   Review of Systems  Constitutional:  Positive for activity change.  Respiratory:  Positive for shortness of breath.   Neurological:  Positive for weakness. Negative for headaches.   Physical Exam Vitals and nursing note reviewed.  Constitutional:      General: He is not in acute distress. Cardiovascular:     Rate and Rhythm: Normal rate.  Pulmonary:     Effort: No tachypnea, accessory muscle usage or respiratory distress.  Abdominal:     General: Abdomen is flat.     Palpations: Abdomen is soft.  Neurological:     Mental Status: He is alert.     Comments: Mostly oriented but difficulty understanding complexity of his overall illness  Vital Signs: BP (!) 156/131 (BP Location: Left Arm)   Pulse 99   Temp 97.9 F (36.6 C) (Oral)   Resp 14   Ht 5' (1.524 m)   Wt 58.3 kg   SpO2 100%   BMI 25.10 kg/m  Pain Scale: 0-10   Pain Score: 0-No pain   SpO2: SpO2: 100 % O2 Device:SpO2: 100 % O2 Flow Rate: .O2 Flow Rate (L/min): 2 L/min  IO: Intake/output summary:  Intake/Output Summary (Last 24 hours) at 04/08/2021 1421 Last data filed at 04/08/2021 0516 Gross per 24 hour  Intake 580 ml  Output 1400 ml  Net -820 ml    LBM: Last BM Date: 04/07/21 Baseline Weight: Weight: 56 kg Most recent weight: Weight: 58.3 kg     Palliative Assessment/Data:     Time Total: 70 min  Greater than 50%  of this time was spent counseling and coordinating care related to the above assessment and plan.  Signed by: Vinie Sill, NP Palliative Medicine Team Pager # [phone] (M-F 8a-5p) Team Phone # [phone] (Nights/Weekends)

## 2021-04-08 NOTE — Assessment & Plan Note (Signed)
Dx'd IIIA NSCLC 12/2019, got chemorad with Carboplatin/paclitaxel to Imfinzi consolidation, the had disease progression in Mar 22 when left parietal brain met was noted.  Underwent Craniectomy by Dr. Kathyrn Sheriff in Apr 2022.  Post craniectomy on Alimta and Keytruda until Oct 27 surveillance scan showed new brain met.  Restarted radiation therapy on 11/8.

## 2021-04-08 NOTE — Assessment & Plan Note (Signed)
-   Follow-up echo in 1 month

## 2021-04-08 NOTE — Assessment & Plan Note (Signed)
-   Voiding trial -Continue bethanechol and Flomax new - Outpaitent urology follow up

## 2021-04-08 NOTE — Progress Notes (Signed)
Progress Note    [name] [name]   [id number]  DOB: 12-23-1955  DOA: 04/01/2021     6 Date of Service: 04/08/2021     Brief summary: Mr. Borntreger is a 65 y.o. 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   In the ER, was post-ictal and neurology noted focal seizure activity. Then vomited, developed respiratory failure and was intubated.     11/14: Admitted to medicine initially for status epilepticus, then vomited, had respiratory failure, intubated and transferred to ICU 11/15: cEEG started, gross hematuria noted 11/16: extubated 11/20: transferred out of unit to [facility] 11/21: continuing to slowly improve; discussed with Dr. Mickeal Skinner, Neuro-oncology, and Dr. 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  cEEG completed that showed no further subclinical seizures. - Continue Keppra  Brain metastasis ([place]) - Continue dexamethasone , taper to BID    Acute metabolic encephalopathy At baseline patient lives independently, has an aide 3x per week.  Here, he is stil somewhat confused in speech (seems alert and responsive, but [facility] unable to state that he is in [place]).  Hard to know how much of this is aphasia versus confusion, but I think some of both.  This appears to be likely post-ictal.  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   Underwent Craniectomy by Dr. Kathyrn Sheriff in Apr 2022.  Post craniectomy on Alimta and Keytruda until Oct 27 surveillance scan showed new brain met.  Restarted radiation therapy on 11/8.       Subjective:  Mental status no change.  No fever overnight.  No cough, sputum, dyspnea.  Still weak in the right leg, having difficulty walking, still difficulty with speech and seems confused.  Objective Vitals:   04/07/21 0800 04/07/21 1559 04/08/21 0000 04/08/21 0803  BP: 113/74 97/65 135/83 102/64  Pulse: 97 94 88 (!) 102  Resp: (!) $RemoveB'21 16 16 14  'vTWRmAEI$ Temp: 98 F (36.7 C) 98.8 F (37.1 C) 98 F (36.7 C) (!) 97.3 F (36.3 C)  TempSrc: Oral Oral Oral Oral  SpO2:  98% 100% 100%  Weight:      Height:       58.3 kg  Vital signs were reviewed and unremarkable.   Exam General appearance: Adult male, sitting in bed, interactive, no acute distress.     HEENT: Anicteric, conjunctival pink, lids and lashes normal.  No nasal deformity, discharge, or epistaxis.  Dentition in adequate repair, oropharynx moist, no oral lesions, hearing normal Skin:  Cardiac: RRR, no murmurs, no lower extremity edema, no JVD Respiratory: Normal respiratory rate and rhythm, lungs clear without rales or wheezes, Abdomen: Abdomen soft, no tenderness palpation or guarding, no ascites or distention MSK: Moderately reduced subcutaneous muscle mass and fat Neuro: Awake and responding to questions, moves upper extremities with what appears to be generally normal strength and coordination, bilateral legs are quite weak, he reports the right is more weak than the left,  although objectively there appears symmetric.  His speech is actually, that he loses his train of thought frequently cannot think of the word he is trying to say, also still seems to be somewhat disoriented as to where he is and what is going on  psych: Attention normal, affect pleasant   Labs / Other Information My review of labs, imaging, notes and other tests is  significant for Normal electrolytes and renal function, hemoglobin stable at 10     Disposition Plan: Status is: Inpatient  Remains inpatient appropriate because: Patient will require significant rehabilitation to return to an independent level of function, at this point he is medically cleared for discharge on his new seizure regimen, will need rehabilitation and placement is pending  Will speak with POA Peggy later today        Time spent: 35 minutes [facility] 04/08/2021, 11:28 AM

## 2021-04-08 NOTE — Assessment & Plan Note (Signed)
At baseline patient lives independently, has an aide 3x per week.  Here, he is stil somewhat confused in speech (seems alert and responsive, but says [facility] is at [facility] unable to state that he is in [place]).  Hard to know how much of this is aphasia versus confusion, but I think some of both.  This appears to be likely post-ictal.

## 2021-04-09 ENCOUNTER — Telehealth: Payer: Self-pay | Admitting: Medical Oncology

## 2021-04-09 DIAGNOSIS — R569 Unspecified convulsions: Secondary | ICD-10-CM | POA: Diagnosis not present

## 2021-04-09 DIAGNOSIS — J9601 Acute respiratory failure with hypoxia: Secondary | ICD-10-CM | POA: Diagnosis not present

## 2021-04-09 DIAGNOSIS — G9341 Metabolic encephalopathy: Secondary | ICD-10-CM | POA: Diagnosis not present

## 2021-04-09 DIAGNOSIS — J690 Pneumonitis due to inhalation of food and vomit: Secondary | ICD-10-CM | POA: Diagnosis not present

## 2021-04-09 MED ORDER — SODIUM CHLORIDE 0.9% FLUSH
10.0000 mL | Freq: Two times a day (BID) | INTRAVENOUS | Status: DC
  Administered 2021-04-09 – 2021-04-13 (×8): 10 mL
  Administered 2021-04-13: 20 mL
  Administered 2021-04-14: 22:00:00 10 mL
  Administered 2021-04-14: 11:00:00 20 mL
  Administered 2021-04-15: 11:00:00 10 mL

## 2021-04-09 MED ORDER — SODIUM CHLORIDE 0.9% FLUSH
10.0000 mL | INTRAVENOUS | Status: DC | PRN
  Administered 2021-04-15: 12:00:00 10 mL

## 2021-04-09 MED ORDER — CHLORHEXIDINE GLUCONATE CLOTH 2 % EX PADS
6.0000 | MEDICATED_PAD | Freq: Every day | CUTANEOUS | Status: DC
  Administered 2021-04-09 – 2021-04-15 (×7): 6 via TOPICAL

## 2021-04-09 NOTE — Progress Notes (Signed)
Progress Note    [name] [name]   [id number]  DOB: 1956-01-18  DOA: 04/01/2021     7 Date of Service: 04/09/2021      Brief summary: [name] [name] is a 65 y.o. 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   In the ER, was post-ictal and neurology noted focal seizure activity. Then vomited, developed respiratory failure and was intubated.     11/14: Admitted to medicine initially for status epilepticus, then vomited, had respiratory failure, intubated and transferred to ICU 11/15: cEEG started, gross hematuria noted 11/16: extubated 11/20: transferred out of unit to [facility] 11/21: continuing to slowly improve; discussed with Dr. Mickeal Skinner, Neuro-oncology, and Dr. 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  Underwent Craniectomy by Dr. Kathyrn Sheriff in Apr 2022.  Post craniectomy on Alimta and Keytruda until Oct 27 surveillance scan showed new brain met.  Restarted radiation therapy on 11/8.  Brain metastasis ([place]) This was a new finding this fall.  Has started radiation for it, until this metastasis caused the current admission.  His Keppra was titrated up, the seizures have stopped, and he is improving.  He still has significant cognitive impairment and new right leg weakness, but Neuro-oncology suspect that this will improve, and it has over the last few days, steadily. - Continue dexamethasone, taper to BID - Follow up with Dr. Julien Nordmann and Dr. Mickeal Skinner at discharge - Plan for d/c to SNF  Acute  respiratory failure with hypoxia [facility]) Resolved  Acute metabolic encephalopathy At baseline patient lives independently, has an aide 3x per week.  Here, he appears to have some subtle cognitive impairment, residual, improving (this was much more 2 days ago, today is obviously better but still there).  This appears to be likely post-ictal.  Expect it will gradually resolve over the next few weeks.  - SLP here recommend to continue SLP at SNF and include cognitive eval by SLP at [facility]  Pericardial effusion This was noted incidentally.  ?malignant. - Follow-up echo in 1 month  Gross hematuria The patient had an episode of gross hematuria in ICU.  Urology were consulted, they placed foley, did bladder irrigation and recommended voiding trial in a few days and outpatietn Uro follow up.  Voiding trial successful 11/21 - Continue new bethanechol and Flomax, plan to stop at discharge - Outpatient urology follow up needed for gross hematuria with Dr. Gloriann Loan  Hypertension Blood pressure controlled off medicines  COPD (chronic obstructive pulmonary disease) (HCC) No active flare - Continue pantoprazole  Malnutrition of moderate degree        Subjective:  No new complaints.  Speech is getting better.  Right leg is still weak.  No vomiting, headache, change in vision.  Objective Vitals:   04/09/21 0011 04/09/21 0440 04/09/21 1150 04/09/21 1738  BP: 125/80 104/70 105/66 117/70  Pulse: 86 86 92 98  Resp: $Remo'16 18 17 18  'oDBVa$ Temp: 97.6 F (36.4 C) 97.9 F (36.6 C) 98.3 F (36.8 C) 98 F (36.7 C)  TempSrc: Oral Oral Oral Oral  SpO2: 98% 97% 100% 100%  Weight:  60 kg    Height:       60 kg  Vital signs were reviewed and unremarkable.   Exam General appearance: Adult male, sitting up in recliner, interactive, no acute distress     HEENT:    Skin:  Cardiac: RRR, no murmurs, no lower extremity edema Respiratory: Normal respiratory rate and rhythm, lungs clear without rales or  wheezes Abdomen: Abdomen soft, no tenderness palpation or guarding, no ascites or distention MSK:  Neuro: Awake and alert, moves upper extremities with normal strength and coordination, right leg 3/5, left leg 4/5, speech fluent, somewhat forgetful, more than Psych: Attention normal, affect pleasant    Labs / Other Information There are no new results to review at this time.   Disposition Plan: Status is: Inpatient  Remains inpatient appropriate because: Patient has had status epilepticus and has some prolonged postictal cognitive impairment and right leg weakness.  These are gradually improving but he will need significant rehabilitation to return to his prior independent level of function.  TOC is working to find a SNF that can transport him to his cancer treatments.  He is medically ready for disposition when safe rehab can be found   Spoke to [employer] by phone     Time spent: 25 minutes [facility] 04/09/2021, 6:47 PM

## 2021-04-09 NOTE — Assessment & Plan Note (Signed)
The patient had an episode of gross hematuria in ICU.  Urology were consulted, they placed foley, did bladder irrigation and recommended voiding trial in a few days and outpatietn Uro follow up.  Voiding trial successful 11/21 - Continue new bethanechol and Flomax, plan to stop at discharge - Outpatient urology follow up needed for gross hematuria with Dr. Gloriann Loan

## 2021-04-09 NOTE — Progress Notes (Addendum)
Speech Language Pathology Treatment: Dysphagia  Patient Details Name: [name] [name] MRN: [id number] DOB: 04/30/1956 Today's Date: 04/09/2021 Time: 9470-9628 SLP Time Calculation (min) (ACUTE ONLY): 14 min  Assessment / Plan / Recommendation Clinical Impression  Pt's mentation seems to be gradually improving compared to initial swallowing evaluation earlier this admission, although he's still repetitive and needs help with redirection. He self-feeds regular solids and thin liquids  He endorses difficulty swallowing bigger pills and bigger pieces of solids sometimes, saying they want to "choke" him. Considering this, he likes the mechanical soft diet. No overt s/s of aspiration are noted. Will continue with Dys 3 diet and thin liquids for now with brief SLP f/u to determine if any further advancements can be made.  SLP also discussed current cognitive-linguistic status with MD with potential need for more direct evaluation. Given improvements noted as pt is coming out of his post-ictal state and potential for ongoing improvements per MD even over the next few weeks, recommend evaluation at next level of care as long as plan is for pt to discharge with full supervision for safety (current recommendation for SNF).    HPI HPI: 65yo male admitted 04/01/21, found acting abnormally and confused/aphasic. Seizure in ED. Intubated 11/15-16/22. PMH: tracheal mass, HTN, anemia, bullous emphysema, COPD, non-small cell lung cancer s/p chemo, crani due to brain tumor, right side deficits. CTHead = hypodensity in Left parietal and occipital lobes. MRI = 1.4cm mets in parasaggital Left parietal lobe.      SLP Plan  Continue with current plan of care      Recommendations for follow up therapy are one component of a multi-disciplinary discharge planning process, led by the attending physician.  Recommendations may be updated based on patient status, additional functional criteria and insurance authorization.     Recommendations  Diet recommendations: Dysphagia 3 (mechanical soft);Thin liquid Liquids provided via: Cup;Straw Medication Administration: Whole meds with puree Supervision: Staff to assist with self feeding;Full supervision/cueing for compensatory strategies Compensations: Slow rate;Small sips/bites Postural Changes and/or Swallow Maneuvers: Seated upright 90 degrees                Oral Care Recommendations: Oral care BID Follow Up Recommendations: Skilled nursing-short term rehab (<3 hours/day) Assistance recommended at discharge: Frequent or constant Supervision/Assistance SLP Visit Diagnosis: Dysphagia, oral phase (R13.11) Plan: Continue with current plan of care       GO                Osie Bond., M.A. [place] [facility] Pager [phone] Office [phone]   04/09/2021, 4:05 PM

## 2021-04-09 NOTE — Assessment & Plan Note (Signed)
This was a new finding this fall.  Has started radiation for it, until this metastasis caused the current admission.  His Keppra was titrated up, the seizures have stopped, and he is improving.  He still has significant cognitive impairment and new right leg weakness, but Neuro-oncology suspect that this will improve, and it has over the last few days, steadily. - Continue dexamethasone, taper to BID - Follow up with Dr. Julien Nordmann and Dr. Mickeal Skinner at discharge - Plan for d/c to SNF

## 2021-04-09 NOTE — TOC Progression Note (Signed)
Transition of Care [facility]) - Progression Note    Patient Details  Name: Banjamin Stovall MRN: [id number] Date of Birth: [birth year]  Transition of Care [facility]) CM/SW [place], [place] Phone Number: 04/09/2021, 4:06 PM  Clinical Narrative:   CSW spoke with Dianne with Dr. Worthy Flank office and [facility] with Dr. Johny Shears office to discuss patient's chemo and radiation follow ups at discharge. Per both, plan is to get patient through rehab so that he is stronger, and then he will have a follow up or a referral to Dr. Mickeal Skinner for follow up to determine next steps. CSW spoke with Admissions at [facility] and they are able to offer as long as patient is not receiving cancer treatment during rehab. [place] to start insurance authorization and CSW to follow.    Expected Discharge Plan: Skilled Nursing Facility Barriers to Discharge: Continued Medical Work up, SNF Pending bed offer  Expected Discharge Plan and Services Expected Discharge Plan: [place] In-house Referral: Clinical Social Work   Post Acute Care Choice: [place] Living arrangements for the past 2 months: Apartment                                       Social Determinants of Health (SDOH) Interventions    Readmission Risk Interventions Readmission Risk Prevention Plan 09/20/2020  Transportation Screening Complete  PCP or Specialist Appt within 3-5 Days Complete  HRI or [place] Complete  Social Work Consult for [place] Planning/Counseling Complete  Palliative Care Screening Complete  Medication Review [employer]) Complete  Some recent data might be hidden

## 2021-04-09 NOTE — Telephone Encounter (Signed)
Updated [name] [name] about tx plan, I told her no treatment right now.Arjan  was referred to Dr Mickeal Skinner for f/u and then Julien Nordmann will need to see him to discuss further treatment.

## 2021-04-09 NOTE — Assessment & Plan Note (Signed)
Admitted and Keppra increased.  cEEG completed that showed no further subclinical seizures. - Continue new Keppra dose

## 2021-04-09 NOTE — Progress Notes (Signed)
Occupational Therapy Treatment Patient Details Name: [name] [name] MRN: [id number] DOB: 04-06-1956 Today's Date: 04/09/2021   History of present illness 65 y/o male presented to ED on 11/14 after being found outside by neighbor acting abnormally and confused. Recently underwent stereotactic radiosurgery to brain x 6 days ago. CT head showed hypodensity in L parietal and occipital lobes. MRI showed 1.4 cm metastsis involving parasagittal L parietal lobe. Seizure activity in ED. No seizures noted on EEG. Intubated 11/15-11/16. PMH: HTN, anemia, bullous emphysema, COPD, and non-small cell lung cancer status post chemoradiation and now on immunotherapy, hx of crani and R sided deficits from hx of brain tumor.   OT comments  Pt making steady progress/goals updated. Pt demonstrates significant cognitive and apparent visual deficits, impacting his ability to live safely alone. Requires min physical assistance and mod VC at times to safely complete tasks. Would need frequent or constant S to DC home, otherwise will need SNF. Pt very pleasant and appreciative. Will continue to follow acutely. Encourage ambulation with staff @ RW level.    Recommendations for follow up therapy are one component of a multi-disciplinary discharge planning process, led by the attending physician.  Recommendations may be updated based on patient status, additional functional criteria and insurance authorization.    Follow Up Recommendations  Skilled nursing-short term rehab (<3 hours/day)    Assistance Recommended at Discharge Frequent or constant Supervision/Assistance  Equipment Recommendations  BSC/3in1    Recommendations for Other Services      Precautions / Restrictions Precautions Precautions: Fall       Mobility Bed Mobility Overal bed mobility: Needs Assistance       Supine to sit: Supervision          Transfers Overall transfer level: Needs assistance Equipment used: Rolling walker (2  wheels) Transfers: Sit to/from Stand Sit to Stand: Min guard                 Balance Overall balance assessment: Needs assistance   Sitting balance-Leahy Scale: Good       Standing balance-Leahy Scale: Poor                             ADL either performed or assessed with clinical judgement   ADL Overall ADL's : Needs assistance/impaired Eating/Feeding: Supervision/ safety   Grooming: Min guard;Standing   Upper Body Bathing: Set up;Supervision/ safety;Sitting   Lower Body Bathing: Minimal assistance;Sit to/from stand   Upper Body Dressing : Sitting;Moderate assistance   Lower Body Dressing: Sit to/from stand;Moderate assistance   Toilet Transfer: Minimal assistance;Ambulation;Rolling walker (2 wheels);Grab bars   Toileting- Clothing Manipulation and Hygiene: Moderate assistance       Functional mobility during ADLs: Minimal assistance;Rolling walker (2 wheels) General ADL Comments: decreased awareness and safety during ADL adn mobility    Extremity/Trunk Assessment Upper Extremity Assessment Upper Extremity Assessment: RUE deficits/detail (generalized weakness but using funcitonally)   Lower Extremity Assessment Lower Extremity Assessment: Defer to PT evaluation        Vision   Additional Comments: ? r field cut; running into items on R   Perception Perception Perception: Impaired   Praxis Praxis Praxis:  (poor spatial awareness)    Cognition Arousal/Alertness: Awake/alert Behavior During Therapy: WFL for tasks assessed/performed Overall Cognitive Status: Impaired/Different from baseline Area of Impairment: Attention;Memory;Safety/judgement;Awareness;Problem solving                   Current Attention Level: Selective Memory: Decreased  short-term memory Following Commands: Follows one step commands consistently Safety/Judgement: Decreased awareness of safety;Decreased awareness of deficits Awareness: Emergent Problem  Solving: Slow processing General Comments: unaware of having BM on his hands; cues to use RW; cues to turn off running water; cues to push RW on floor rather than lift and carry it          Exercises     Shoulder Instructions       General Comments      Pertinent Vitals/ Pain       Pain Assessment: No/denies pain  Home Living                                          Prior Functioning/Environment              Frequency  Min 2X/week        Progress Toward Goals  OT Goals(current goals can now be found in the care plan section)  Progress towards OT goals: Goals met and updated - see care plan  Acute Rehab OT Goals Patient Stated Goal: to find a place to stay OT Goal Formulation: With patient Time For Goal Achievement: 04/18/21 Potential to Achieve Goals: Good ADL Goals Pt Will Perform Upper Body Bathing:  (goal met) Pt Will Perform Lower Body Bathing: sit to/from stand;with modified independence Pt Will Perform Lower Body Dressing: with modified independence;sit to/from stand Pt Will Transfer to Toilet: with modified independence;ambulating Additional ADL Goal #1:  (goal met) Additional ADL Goal #2: Pt will ambulate around room during ADL tasks using compensatory strategies with min VC to avoid running into obstacles and reduce risk of falls  Plan Discharge plan remains appropriate    Co-evaluation                 AM-PAC OT "6 Clicks" Daily Activity     Outcome Measure   Help from another person eating meals?: A Little Help from another person taking care of personal grooming?: A Little Help from another person toileting, which includes using toliet, bedpan, or urinal?: A Lot Help from another person bathing (including washing, rinsing, drying)?: A Lot Help from another person to put on and taking off regular upper body clothing?: A Little Help from another person to put on and taking off regular lower body clothing?: A Lot 6 Click  Score: 15    End of Session    OT Visit Diagnosis: Unsteadiness on feet (R26.81);Other abnormalities of gait and mobility (R26.89);Muscle weakness (generalized) (M62.81);Other symptoms and signs involving the nervous system (R29.898);Other symptoms and signs involving cognitive function;Low vision, both eyes (H54.2);History of falling (Z91.81)   Activity Tolerance Patient tolerated treatment well   Patient Left in chair;with call bell/phone within reach;with chair alarm set   Nurse Communication Mobility status        Time: 3009-2330 OT Time Calculation (min): 38 min  Charges: OT General Charges $OT Visit: 1 Visit OT Treatments $Self Care/Home Management : 38-52 mins  Maurie Boettcher, OT/L   Acute OT Clinical Specialist [place] Pager [phone] Office [phone]   [facility] 04/09/2021, 11:23 AM

## 2021-04-09 NOTE — Assessment & Plan Note (Signed)
Completed antibiotics

## 2021-04-09 NOTE — Assessment & Plan Note (Signed)
This was noted incidentally.  ?malignant. - Follow-up echo in 1 month

## 2021-04-09 NOTE — Assessment & Plan Note (Signed)
Dx'd IIIA NSCLC 12/2019, got chemorad with Carboplatin/paclitaxel to Imfinzi consolidation, the had disease progression in Mar 22 when left parietal brain met was noted.  Underwent Craniectomy by Dr. Kathyrn Sheriff in Apr 2022.  Post craniectomy on Alimta and Keytruda until Oct 27 surveillance scan showed new brain met.  Restarted radiation therapy on 11/8.

## 2021-04-09 NOTE — Assessment & Plan Note (Signed)
Blood pressure controlled off medicines

## 2021-04-09 NOTE — Assessment & Plan Note (Signed)
At baseline patient lives independently, has an aide 3x per week.  Here, he appears to have some subtle cognitive impairment, residual, improving (this was much more 2 days ago, today is obviously better but still there).  This appears to be likely post-ictal.  Expect it will gradually resolve over the next few weeks.  - SLP here recommend to continue SLP at SNF and include cognitive eval by SLP at [facility]

## 2021-04-09 NOTE — Assessment & Plan Note (Signed)
No active flare - Continue pantoprazole

## 2021-04-09 NOTE — Assessment & Plan Note (Signed)
Resolved

## 2021-04-10 ENCOUNTER — Other Ambulatory Visit: Payer: Self-pay | Admitting: Radiation Therapy

## 2021-04-10 DIAGNOSIS — G9341 Metabolic encephalopathy: Secondary | ICD-10-CM | POA: Diagnosis not present

## 2021-04-10 DIAGNOSIS — C7931 Secondary malignant neoplasm of brain: Secondary | ICD-10-CM | POA: Diagnosis not present

## 2021-04-10 MED ORDER — DEXAMETHASONE 4 MG PO TABS
4.0000 mg | ORAL_TABLET | Freq: Three times a day (TID) | ORAL | Status: DC
  Administered 2021-04-10 – 2021-04-15 (×16): 4 mg via ORAL
  Filled 2021-04-10 (×16): qty 1

## 2021-04-10 NOTE — TOC Progression Note (Signed)
Transition of Care [facility]) - Progression Note    Patient Details  Name: [name] [name] MRN: [id number] Date of Birth: Dec 06, 1955  Transition of Care [facility]) CM/SW [place], [place] Phone Number: 04/10/2021, 1:10 PM  Clinical Narrative:   CSW following for discharge to SNF. Insurance authorization is still pending at this time. CSW to follow.    Expected Discharge Plan: Skilled Nursing Facility Barriers to Discharge: Continued Medical Work up, SNF Pending bed offer  Expected Discharge Plan and Services Expected Discharge Plan: [place] In-house Referral: Clinical Social Work   Post Acute Care Choice: [place] Living arrangements for the past 2 months: Apartment                                       Social Determinants of Health (SDOH) Interventions    Readmission Risk Interventions Readmission Risk Prevention Plan 09/20/2020  Transportation Screening Complete  PCP or Specialist Appt within 3-5 Days Complete  HRI or [place] Complete  Social Work Consult for [place] Planning/Counseling Complete  Palliative Care Screening Complete  Medication Review [employer] photographer) Complete  Some recent data might be hidden

## 2021-04-10 NOTE — Plan of Care (Signed)
  Problem: Education: Goal: Knowledge of General Education information will improve Description: Including pain rating scale, medication(s)/side effects and non-pharmacologic comfort measures Outcome: Progressing   Problem: Clinical Measurements: Goal: Ability to maintain clinical measurements within normal limits will improve Outcome: Progressing   Problem: Activity: Goal: Risk for activity intolerance will decrease Outcome: Progressing   Problem: Safety: Goal: Ability to remain free from injury will improve Outcome: Progressing   Problem: Education: Goal: Expressions of having a comfortable level of knowledge regarding the disease process will increase Outcome: Progressing   Problem: Coping: Goal: Ability to adjust to condition or change in health will improve Outcome: Progressing   Problem: Health Behavior/Discharge Planning: Goal: Compliance with prescribed medication regimen will improve Outcome: Progressing   Problem: Medication: Goal: Risk for medication side effects will decrease Outcome: Progressing

## 2021-04-10 NOTE — Progress Notes (Signed)
[facility] PROGRESS NOTE   Ignacio Lowder MOQ:[id number] DOB: [birth year] DOA: 04/01/2021  PCP: [name], NP  Brief History/Interval Summary: Mr. Strege is a 65 y.o. 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 In the ER, was post-ictal and neurology noted focal seizure activity. Then vomited, developed respiratory failure and was intubated.   11/14: Admitted to medicine initially for status epilepticus, then vomited, had respiratory failure, intubated and transferred to ICU 11/15: cEEG started, gross hematuria noted 11/16: extubated 11/20: transferred out of unit to [facility] 11/21: continuing to slowly improve; discussed with Dr. Mickeal Skinner, Neuro-oncology, and Dr. Hortense Ramal, epileptology, his current deficits are likely related to prolonged post-ictal state and should resolve   Consultants: Neurology  Procedures: EEG    Subjective/Interval History: Patient denies any chest pain shortness of breath.  Complains of slight headache this morning.  No nausea vomiting.     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  Was seen by neurology. Has not had any further seizure activity in the last 48 hours.  Plan is to continue Keppra and follow-up with neurology in 6 to 8 weeks.  Aspiration pneumonia of right lung/acute respiratory failure with hypoxia The patient's respiratory status is stable.  He has completed course of antibiotics.  He is currently saturating normal on room air.  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  Initially diagnosed in 2021.  Underwent craniectomy by neurosurgery for brain metastases in April 2022.  Has been getting radiation treatment up until recently.  Brain metastases Underwent radiation minaprine till recently.  Has had cognitive  impairment as well as right lower extremity weakness as result of these lesions.  Remains on dexamethasone currently every 6 hours intravenously.  Plan is to taper it to twice a day.  We will change to oral steroids.  Appears the patient to follow-up with Dr. Julien Nordmann or Dr. Mickeal Skinner or discharge.  Current plan is for discharge to skilled nursing facility for short-term rehab.  Acute metabolic encephalopathy In setting of her brain metastases as well as seizures.  Could have been postictal.  Is noted to have some cognitive impairment.  Hopefully will get better assess brain lesions improved.  Pericardial effusion Noted incidentally.  Follow-up echocardiogram in 1 month.  Gross hematuria Had gross hematuria in the ICU.  Urology was consulted.  Foley catheter was placed.  They did bladder irrigation.  Foley catheter was removed subsequently.  Voiding trial was successful on 11/21.  Continue Flomax and bethanechol with plans to stop these at discharge. Outpatient follow-up with Dr. Gloriann Loan with urology as needed.  Essential hypertension Monitor blood pressures closely.  Normocytic anemia Hemoglobin is stable.  COPD Stable.  Moderate malnutrition Nutrition Problem: Moderate Malnutrition Etiology: chronic illness, cancer and cancer related treatments  Signs/Symptoms: moderate fat depletion, moderate muscle depletion, percent weight loss Percent weight loss: 8.4 %  Interventions: Tube feeding, Prostat   DVT Prophylaxis: Lovenox Code Status: Full code Family Communication: Discussed with patient.  No family at bedside Disposition Plan: Waiting on SNF  Status is: Inpatient  Remains inpatient appropriate because: Seizures in the setting of brain metastases, need for short-term rehab    Medications: Scheduled:  bethanechol  10 mg Oral TID   chlorhexidine  15 mL Mouth Rinse BID   Chlorhexidine Gluconate Cloth  6 each Topical Daily   clonazepam  0.25  mg Oral BID   dexamethasone  (DECADRON) injection  4 mg Intravenous Q6H   enoxaparin (LOVENOX) injection  40 mg Subcutaneous Q24H   feeding supplement  237 mL Oral BID BM   levETIRAcetam  1,000 mg Oral BID   mouth rinse  15 mL Mouth Rinse q12n4p   pantoprazole  40 mg Oral Q1200   polyethylene glycol  17 g Oral Daily   sodium chloride flush  10-40 mL Intracatheter Q12H   tamsulosin  0.4 mg Oral QPC supper   Continuous: KWI:OXBDZHGDJMEQA **OR** acetaminophen, docusate, ipratropium-albuterol, labetalol, LORazepam, ondansetron **OR** ondansetron (ZOFRAN) IV, sodium chloride flush  Antibiotics: Anti-infectives (From admission, onward)    Start     Dose/Rate Route Frequency Ordered Stop   04/03/21 1200  cefTRIAXone (ROCEPHIN) 1 g in sodium chloride 0.9 % 100 mL IVPB        1 g 200 mL/hr over 30 Minutes Intravenous Every 24 hours 04/03/21 0836 04/07/21 1316   04/02/21 1930  vancomycin (VANCOREADY) IVPB 1250 mg/250 mL  Status:  Discontinued        1,250 mg 166.7 mL/hr over 90 Minutes Intravenous Every 24 hours 04/01/21 2103 04/02/21 1010   04/02/21 1100  vancomycin (VANCOREADY) IVPB 750 mg/150 mL  Status:  Discontinued        750 mg 150 mL/hr over 60 Minutes Intravenous Every 12 hours 04/02/21 1010 04/03/21 0836   04/02/21 0400  ceFEPIme (MAXIPIME) 2 g in sodium chloride 0.9 % 100 mL IVPB  Status:  Discontinued        2 g 200 mL/hr over 30 Minutes Intravenous Every 8 hours 04/01/21 2103 04/03/21 0836   04/01/21 1915  ceFEPIme (MAXIPIME) 2 g in sodium chloride 0.9 % 100 mL IVPB        2 g 200 mL/hr over 30 Minutes Intravenous  Once 04/01/21 1914 04/01/21 2053   04/01/21 1915  metroNIDAZOLE (FLAGYL) IVPB 500 mg        500 mg 100 mL/hr over 60 Minutes Intravenous  Once 04/01/21 1914 04/02/21 0233   04/01/21 1915  vancomycin (VANCOREADY) IVPB 1250 mg/250 mL        1,250 mg 166.7 mL/hr over 90 Minutes Intravenous  Once 04/01/21 1914 04/02/21 0233       Objective:  Vital Signs  Vitals:   04/09/21 2006 04/10/21  0021 04/10/21 0445 04/10/21 0848  BP: (!) 103/54 97/76 113/74 108/63  Pulse: 97 92 88 86  Resp: 16 18 16 17   Temp: (!) 97.5 F (36.4 C) (!) 97.5 F (36.4 C) 98.5 F (36.9 C) 98 F (36.7 C)  TempSrc: Oral Oral Oral Oral  SpO2: 96% 98% 97% 100%  Weight:   60.1 kg   Height:        Intake/Output Summary (Last 24 hours) at 04/10/2021 1006 Last data filed at 04/10/2021 0446 Gross per 24 hour  Intake 1197 ml  Output 1460 ml  Net -263 ml   Filed Weights   04/07/21 0522 04/09/21 0440 04/10/21 0445  Weight: 58.3 kg 60 kg 60.1 kg    General appearance: Awake alert.  In no distress.  Distracted Resp: Clear to auscultation bilaterally.  Normal effort Cardio: S1-S2 is normal regular.  No S3-S4.  No rubs murmurs or bruit GI: Abdomen is soft.  Nontender nondistended.  Bowel sounds are present normal.  No masses organomegaly Extremities: No edema.  Noted to have weakness of both lower extremities Neurologic: Weakness noted in both lower legs right more than left.  Lab Results:  Data Reviewed: I have personally reviewed following labs and imaging studies  CBC: Recent Labs  Lab 04/04/21 0830 04/05/21 0500 04/06/21 0500 04/08/21 0244  WBC 10.2 6.2 5.4 6.0  NEUTROABS  --   --  4.3  --   HGB 10.2* 9.3* 10.5* 10.8*  HCT 32.5* 30.5* 32.8* 33.3*  MCV 84.0 85.0 82.2 81.8  PLT 268 248 297 109    Basic Metabolic Panel: Recent Labs  Lab 04/04/21 0515 04/05/21 0500 04/06/21 0500 04/08/21 0244  NA 138 140 132* 136  K 4.0 3.4* 3.7 3.9  CL 105 104 96* 104  CO2 27 30 27 25   GLUCOSE 91 102* 107* 112*  BUN 7* 7* 9 21  CREATININE 0.66 0.65 0.64 0.70  CALCIUM 9.2 9.0 8.8* 9.0  MG  --   --  1.6*  --   PHOS 2.4* 2.9  --   --     GFR: Estimated Creatinine Clearance: 71.3 mL/min (by C-G formula based on SCr of 0.7 mg/dL).  Liver Function Tests: Recent Labs  Lab 04/04/21 0515 04/05/21 0500  ALBUMIN 2.8* 2.6*     CBG: Recent Labs  Lab 04/05/21 1534 04/05/21 1920  04/05/21 2309 04/06/21 0739 04/06/21 1615  GLUCAP 84 116* 105* 113* 140*     Recent Results (from the past 240 hour(s))  Resp Panel by RT-PCR (Flu A&B, Covid)     Status: None   Collection Time: 04/01/21  7:10 PM   Specimen: Nasopharyngeal(NP) swabs in vial transport medium  Result Value Ref Range Status   SARS Coronavirus 2 by RT PCR NEGATIVE NEGATIVE Final    Comment: (NOTE) SARS-CoV-2 target nucleic acids are NOT DETECTED.  The SARS-CoV-2 RNA is generally detectable in upper respiratory specimens during the acute phase of infection. The lowest concentration of SARS-CoV-2 viral copies this assay can detect is 138 copies/mL. A negative result does not preclude SARS-Cov-2 infection and should not be used as the sole basis for treatment or other patient management decisions. A negative result may occur with  improper specimen collection/handling, submission of specimen other than nasopharyngeal swab, presence of viral mutation(s) within the areas targeted by this assay, and inadequate number of viral copies(<138 copies/mL). A negative result must be combined with clinical observations, patient history, and epidemiological information. The expected result is Negative.  Fact Sheet for Patients:  EntrepreneurPulse.com.au  Fact Sheet for Healthcare Providers:  IncredibleEmployment.be  This test is no t yet approved or cleared by the [place] FDA and  has been authorized for detection and/or diagnosis of SARS-CoV-2 by FDA under an Emergency Use Authorization (EUA). This EUA will remain  in effect (meaning this test can be used) for the duration of the COVID-19 declaration under Section 564(b)(1) of the Act, 21 U.S.C.section 360bbb-3(b)(1), unless the authorization is terminated  or revoked sooner.       Influenza A by PCR NEGATIVE NEGATIVE Final   Influenza B by PCR NEGATIVE NEGATIVE Final    Comment: (NOTE) The Xpert Xpress  SARS-CoV-2/FLU/RSV plus assay is intended as an aid in the diagnosis of influenza from Nasopharyngeal swab specimens and should not be used as a sole basis for treatment. Nasal washings and aspirates are unacceptable for Xpert Xpress SARS-CoV-2/FLU/RSV testing.  Fact Sheet for Patients: EntrepreneurPulse.com.au  Fact Sheet for Healthcare Providers: IncredibleEmployment.be  This test is not yet approved or cleared by the [place] FDA and has been authorized for detection and/or diagnosis of SARS-CoV-2 by FDA under an Emergency Use Authorization (  EUA). This EUA will remain in effect (meaning this test can be used) for the duration of the COVID-19 declaration under Section 564(b)(1) of the Act, 21 U.S.C. section 360bbb-3(b)(1), unless the authorization is terminated or revoked.  Performed at [facility], [place] [address] [place], [place] [address]   Blood culture (routine x 2)     Status: None   Collection Time: 04/01/21  8:35 PM   Specimen: BLOOD RIGHT HAND  Result Value Ref Range Status   Specimen Description BLOOD RIGHT HAND  Final   Special Requests   Final    BOTTLES DRAWN AEROBIC AND ANAEROBIC Blood Culture results may not be optimal due to an inadequate volume of blood received in culture bottles   Culture   Final    NO GROWTH 5 DAYS Performed at [facility], [place] [address], [place], [place] [address]    Report Status 04/06/2021 FINAL  Final  Blood culture (routine x 2)     Status: None   Collection Time: 04/01/21  8:53 PM   Specimen: BLOOD  Result Value Ref Range Status   Specimen Description BLOOD LEFT ANTECUBITAL  Final   Special Requests   Final    BOTTLES DRAWN AEROBIC ONLY Blood Culture results may not be optimal due to an inadequate volume of blood received in culture bottles   Culture   Final    NO GROWTH 5 DAYS Performed at [facility], [place] [address], [place], [place] [address]    Report Status  04/06/2021 FINAL  Final  MRSA Next Gen by PCR, Nasal     Status: None   Collection Time: 04/02/21  5:03 AM   Specimen: Nasal Mucosa; Nasal Swab  Result Value Ref Range Status   MRSA by PCR Next Gen NOT DETECTED NOT DETECTED Final    Comment: (NOTE) The GeneXpert MRSA Assay (FDA approved for NASAL specimens only), is one component of a comprehensive MRSA colonization surveillance program. It is not intended to diagnose MRSA infection nor to guide or monitor treatment for MRSA infections. Test performance is not FDA approved in patients less than 67 years old. Performed at [facility], [place] [address], [place], [place] [address]   Urine Culture     Status: None   Collection Time: 04/02/21 10:22 AM   Specimen: Urine, Catheterized  Result Value Ref Range Status   Specimen Description URINE, CATHETERIZED  Final   Special Requests NONE  Final   Culture   Final    NO GROWTH Performed at [facility], [place] [address], [place], [place] [address]    Report Status 04/03/2021 FINAL  Final      Radiology Studies: No results found.     LOS: 8 days   Marques Ericson [employer] on [email or web address]  04/10/2021, 10:06 AM

## 2021-04-10 NOTE — Progress Notes (Signed)
Speech Language Pathology Treatment: Dysphagia  Patient Details Name: [name] [name] MRN: [id number] DOB: 1956-02-08 Today's Date: 04/10/2021 Time: 5009-3818 SLP Time Calculation (min) (ACUTE ONLY): 12 min  Assessment / Plan / Recommendation Clinical Impression  SLP provided skilled observation during lunch meal. Pt says he likes his food coming as it does on his mechanical soft diet. A delayed cough was noted x1 after a bite of solid food but this was not seen again across any self-fed trials without additional cueing needed. Education about precautions was reinforced for safety. Recommend continuing with Dys 3 diet and thin liquids, which is likely to remain the most appropriate diet during his acute stay. SLP to sign off for now, but please reorder with any acute concerns. Otherwise, would recommend f/u at least for cognitive evaluation at next level of care.   HPI HPI: 65yo male admitted 04/01/21, found acting abnormally and confused/aphasic. Seizure in ED. Intubated 11/15-16/22. PMH: tracheal mass, HTN, anemia, bullous emphysema, COPD, non-small cell lung cancer s/p chemo, crani due to brain tumor, right side deficits. CTHead = hypodensity in Left parietal and occipital lobes. MRI = 1.4cm mets in parasaggital Left parietal lobe.      SLP Plan  All goals met      Recommendations for follow up therapy are one component of a multi-disciplinary discharge planning process, led by the attending physician.  Recommendations may be updated based on patient status, additional functional criteria and insurance authorization.    Recommendations  Diet recommendations: Dysphagia 3 (mechanical soft);Thin liquid Liquids provided via: Cup;Straw Medication Administration: Whole meds with puree Supervision: Patient able to self feed;Intermittent supervision to cue for compensatory strategies Compensations: Slow rate;Small sips/bites Postural Changes and/or Swallow Maneuvers: Seated upright 90 degrees                 Oral Care Recommendations: Oral care BID Follow Up Recommendations: Skilled nursing-short term rehab (<3 hours/day) Assistance recommended at discharge: Frequent or constant Supervision/Assistance SLP Visit Diagnosis: Dysphagia, oral phase (R13.11) Plan: All goals met       GO                Osie Bond., M.A. [place] Acute Rehabilitation Services Pager [phone] Office [phone]   04/10/2021, 1:03 PM

## 2021-04-10 NOTE — Progress Notes (Signed)
Physical Therapy Treatment Patient Details Name: [name] [name] MRN: [id number] DOB: [birth year] Today's Date: 04/10/2021   History of Present Illness 65 y/o male presented to ED on 11/14 after being found outside by neighbor acting abnormally and confused. Recently underwent stereotactic radiosurgery to brain x 6 days ago. CT head showed hypodensity in L parietal and occipital lobes. MRI showed 1.4 cm metastsis involving parasagittal L parietal lobe. Seizure activity in ED. No seizures noted on EEG. Intubated 11/15-11/16. PMH: HTN, anemia, bullous emphysema, COPD, and non-small cell lung cancer status post chemoradiation and now on immunotherapy, hx of crani and R sided deficits from hx of brain tumor.    PT Comments    Pt received in chair, pleasantly agreeable to therapy session and very motivated to work on hallway ambulation. Pt with good effort during gait trial but decreased carryover of postural/safety cues for RW use and noted continued decreased L dorsiflexion, step length and slow gait speed <0.4 m/s, all indicating he remains a high fall risk. Pt able to perform household distance gait task today with minA, making good progress toward goals. Pt continues to benefit from PT services to progress toward functional mobility goals. Continue to recommend low to moderate intensity post-acute rehab upon discharge.   Recommendations for follow up therapy are one component of a multi-disciplinary discharge planning process, led by the attending physician.  Recommendations may be updated based on patient status, additional functional criteria and insurance authorization.  Follow Up Recommendations  Skilled nursing-short term rehab (<3 hours/day)     Assistance Recommended at Discharge Frequent or constant Supervision/Assistance  Equipment Recommendations  Other (comment) (TBD)    Recommendations for Other Services       Precautions / Restrictions Precautions Precautions:  Fall Restrictions Weight Bearing Restrictions: No     Mobility  Bed Mobility               General bed mobility comments: pt received up in chair, wanted to remain in chair; alarm on.    Transfers Overall transfer level: Needs assistance Equipment used: Rolling walker (2 wheels) Transfers: Sit to/from Stand Sit to Stand: Min guard           General transfer comment: from chair<>RW, cues for hand placement needed    Ambulation/Gait Ambulation/Gait assistance: Min assist Gait Distance (Feet): 90 Feet Assistive device: Rolling walker (2 wheels) Gait Pattern/deviations: Step-through pattern;Drifts right/left;Trunk flexed;Narrow base of support Gait velocity: decreased     General Gait Details: Pt requiring heavy cues for proximity to RW and improved heel to toe pattern on LLE, pt has tendency to adduct with narrow BOS; cues also for upright posture and directional navigation/activity pacing   Stairs             Wheelchair Mobility    Modified Rankin (Stroke Patients Only)       Balance Overall balance assessment: Needs assistance Sitting-balance support: No upper extremity supported;Feet supported Sitting balance-Leahy Scale: Fair     Standing balance support: Bilateral upper extremity supported;During functional activity;Reliant on assistive device for balance Standing balance-Leahy Scale: Poor Standing balance comment: static standing balance poor/fair, dynamic standing balance poor                            Cognition Arousal/Alertness: Awake/alert Behavior During Therapy: WFL for tasks assessed/performed Overall Cognitive Status: No family/caregiver present to determine baseline cognitive functioning  Current Attention Level: Focused Memory: Decreased short-term memory Following Commands: Follows one step commands with increased time;Follows one step commands consistently Safety/Judgement: Decreased  awareness of safety;Decreased awareness of deficits Awareness: Intellectual Problem Solving: Slow processing;Decreased initiation;Requires verbal cues;Requires tactile cues;Difficulty sequencing General Comments: Follows 1 step commands with increased time, decreased carryover of safety/RW use cueing within session.        Exercises Other Exercises Other Exercises: ankle pumps/circles x10 reps ea    General Comments General comments (skin integrity, edema, etc.): reviewed HEP in supine, "General Strengthening - Supine" handout given to reinforce      Pertinent Vitals/Pain Pain Assessment: No/denies pain Pain Location: "sometimes I get stiffness but it gets better if I get OOB and move" Pain Intervention(s): Monitored during session;Repositioned     PT Goals (current goals can now be found in the care plan section) Acute Rehab PT Goals Patient Stated Goal: did not state PT Goal Formulation: Patient unable to participate in goal setting Time For Goal Achievement: 04/18/21 Progress towards PT goals: Progressing toward goals    Frequency    Min 2X/week      PT Plan Current plan remains appropriate       AM-PAC PT "6 Clicks" Mobility   Outcome Measure  Help needed turning from your back to your side while in a flat bed without using bedrails?: A Little Help needed moving from lying on your back to sitting on the side of a flat bed without using bedrails?: A Little Help needed moving to and from a bed to a chair (including a wheelchair)?: A Little Help needed standing up from a chair using your arms (e.g., wheelchair or bedside chair)?: A Little Help needed to walk in hospital room?: A Lot Help needed climbing 3-5 steps with a railing? : Total 6 Click Score: 15    End of Session Equipment Utilized During Treatment: Gait belt Activity Tolerance: Patient tolerated treatment well Patient left: with call bell/phone within reach;in chair;with chair alarm set Nurse  Communication: Mobility status PT Visit Diagnosis: Unsteadiness on feet (R26.81);Muscle weakness (generalized) (M62.81);Difficulty in walking, not elsewhere classified (R26.2);Other symptoms and signs involving the nervous system (V78.588)     Time: 5027-7412 PT Time Calculation (min) (ACUTE ONLY): 15 min  Charges:  $Gait Training: 8-22 mins                     Donyel Castagnola P., PTA Acute Rehabilitation Services Pager: [phone] Office: [place] 04/10/2021, 10:06 AM

## 2021-04-11 DIAGNOSIS — C7931 Secondary malignant neoplasm of brain: Secondary | ICD-10-CM | POA: Diagnosis not present

## 2021-04-11 MED ORDER — CLONAZEPAM 0.25 MG PO TBDP: 0.2500 mg | ORAL_TABLET | Freq: Two times a day (BID) | ORAL | 0 refills | Status: DC

## 2021-04-11 MED ORDER — LEVETIRACETAM 1000 MG PO TABS: 1000.0000 mg | ORAL_TABLET | Freq: Two times a day (BID) | ORAL | Status: DC

## 2021-04-11 MED ORDER — BUDESONIDE-FORMOTEROL FUMARATE 80-4.5 MCG/ACT IN AERO: 2.0000 | INHALATION_SPRAY | Freq: Two times a day (BID) | RESPIRATORY_TRACT | 11 refills | Status: DC

## 2021-04-11 MED ORDER — IPRATROPIUM-ALBUTEROL 0.5-2.5 (3) MG/3ML IN SOLN: 3.0000 mL | RESPIRATORY_TRACT | Status: AC | PRN

## 2021-04-11 MED ORDER — POLYETHYLENE GLYCOL 3350 17 G PO PACK: 17.0000 g | PACK | Freq: Every day | ORAL | 0 refills | Status: AC

## 2021-04-11 MED ORDER — DEXAMETHASONE 4 MG PO TABS: ORAL_TABLET | ORAL | Status: DC

## 2021-04-11 NOTE — Discharge Summary (Addendum)
[facility]  Physician Discharge Summary   Patient ID: [name] [name] MRN: [id number] DOB/AGE: Jun 19, 1955 65 y.o.  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 Barbaraann [name] with neuro-oncology will schedule follow-up for this patient with this office. Patient to follow-up with Dr. Arbutus [name] for his lung cancer. Follow-up with Dr. Alvester [name] with urology if there are further issues with hematuria or urinary retention Needs follow-up with neurology for seizure. Please check CBC and basic metabolic panel in 1 week Needs echocardiogram in 1 month to follow-up on pericardial effusion    Home Health: Going to SNF Equipment/Devices: None  CODE STATUS: Full code  DISCHARGE CONDITION: fair  Diet recommendation: Dysphagia 3 diet with thin liquids  INITIAL HISTORY:  [name] [name] is a 65 y.o. 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 In the ER, was post-ictal and neurology noted focal seizure activity. Then vomited, developed respiratory failure and was intubated.   11/14: Admitted to medicine initially for status epilepticus, then vomited, had respiratory failure, intubated and transferred to ICU 11/15: cEEG started, gross hematuria noted 11/16: extubated 11/20: transferred out of unit to [facility] 11/21: continuing to slowly improve; discussed with Dr. Barbaraann [name], Neuro-oncology, and Dr. 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  Currently on higher dose of Keppra.  Has been seizure-free.     Aspiration pneumonia of right lung/acute respiratory failure with hypoxia Patient has completed course of antibiotics.  Respiratory status is stable.  Saturating normal on room air.     Non-small cell lung cancer with metastases Initially diagnosed in 2021.  Underwent craniectomy by neurosurgery for brain metastases in April 2022.  Has been getting radiation treatment up until recently. Follow-up with medical oncology.  Brain metastases Underwent radiation treatment till recently.  Has had cognitive impairment as well as right lower extremity weakness as result of these lesions.   Remains on dexamethasone which will be tapered down to twice a day if and then further decisions regarding this to be made by Dr. Barbaraann [name].   Dr. Barbaraann [name] to arrange follow-up for the patient in a week or so.     Acute metabolic encephalopathy Was in the setting of brain metastases as well as seizures.  Could have been postictal.  Is noted to have some cognitive impairment.  Hopefully will get better with treatment for cancer.   Pericardial effusion Noted incidentally.  Follow-up echocardiogram in 1 month.  Gross hematuria Had gross hematuria in the ICU.  Urology was consulted.  Foley catheter was placed.  They did bladder irrigation.  Foley catheter was removed subsequently.  Voiding trial was successful on 11/21.  Continue Flomax and bethanechol with plans to stop these at discharge. Outpatient follow-up with Dr. Alvester [name] with urology as needed.   Essential hypertension Stable.  Not requiring any antihypertensives for now.   Normocytic anemia Hemoglobin is stable.  COPD Stable.  Moderate malnutrition Nutrition Problem: Moderate Malnutrition Etiology: chronic  illness, cancer and cancer related treatments   Signs/Symptoms: moderate fat depletion, moderate muscle depletion, percent weight loss Percent weight loss: 8.4 %   Interventions: Tube feeding, Prostat   Patient is stable.  Okay for discharge to SNF when bed is available.   PERTINENT LABS:  The results of significant diagnostics from this hospitalization (including imaging, microbiology, ancillary and laboratory) are listed below for reference.    Microbiology: Recent Results (from the past 240 hour(s))  Resp Panel by RT-PCR (Flu A&B, Covid)     Status: None   Collection Time: 04/01/21  7:10 PM   Specimen: Nasopharyngeal(NP) swabs in vial transport medium  Result Value Ref Range Status   SARS Coronavirus 2 by RT PCR NEGATIVE NEGATIVE Final    Comment: (NOTE) SARS-CoV-2 target nucleic acids are NOT DETECTED.  The SARS-CoV-2 RNA is generally detectable in upper respiratory specimens during the acute phase of infection. The lowest concentration of SARS-CoV-2 viral copies this assay can detect is 138 copies/mL. A negative result does not preclude SARS-Cov-2 infection and should not be used as the sole basis for treatment or other patient management decisions. A negative result may occur with  improper specimen collection/handling, submission of specimen other than nasopharyngeal swab, presence of viral mutation(s) within the areas targeted by this assay, and inadequate number of viral copies(<138 copies/mL). A negative result must be combined with clinical observations, patient history, and epidemiological information. The expected result is Negative.  Fact Sheet for Patients:  EntrepreneurPulse.com.au  Fact Sheet for Healthcare Providers:  IncredibleEmployment.be  This test is no t yet approved or cleared by the [place] FDA and  has been authorized for detection and/or diagnosis of SARS-CoV-2 by FDA under an Emergency Use Authorization  (EUA). This EUA will remain  in effect (meaning this test can be used) for the duration of the COVID-19 declaration under Section 564(b)(1) of the Act, 21 U.S.C.section 360bbb-3(b)(1), unless the authorization is terminated  or revoked sooner.       Influenza A by PCR NEGATIVE NEGATIVE Final   Influenza B by PCR NEGATIVE NEGATIVE Final    Comment: (NOTE) The Xpert Xpress SARS-CoV-2/FLU/RSV plus assay is intended as an aid in the diagnosis of influenza from Nasopharyngeal swab specimens and should not be used as a sole basis for treatment. Nasal washings and aspirates are unacceptable for Xpert Xpress SARS-CoV-2/FLU/RSV testing.  Fact Sheet for Patients: EntrepreneurPulse.com.au  Fact Sheet for Healthcare Providers: IncredibleEmployment.be  This test is not yet approved or cleared by the [place] FDA and has been authorized for detection and/or diagnosis of SARS-CoV-2 by FDA under an Emergency Use Authorization (EUA). This EUA will remain in effect (meaning this test can be used) for the duration of the COVID-19 declaration under Section 564(b)(1) of the Act, 21 U.S.C. section 360bbb-3(b)(1), unless the authorization is terminated or revoked.  Performed at [facility], [place] [address] [place], [place] [address]   Blood culture (routine x 2)     Status: None   Collection Time: 04/01/21  8:35 PM   Specimen: BLOOD RIGHT HAND  Result Value Ref Range Status   Specimen Description BLOOD RIGHT HAND  Final   Special Requests   Final    BOTTLES DRAWN AEROBIC AND ANAEROBIC Blood Culture results may not be optimal due to an inadequate volume of blood received in culture bottles   Culture   Final    NO GROWTH 5 DAYS Performed at [facility], [place] [address], [place], [place]  [address]    Report Status 04/06/2021 FINAL  Final  Blood culture (routine x 2)     Status: None   Collection Time: 04/01/21  8:53 PM   Specimen: BLOOD   Result Value Ref Range Status   Specimen Description BLOOD LEFT ANTECUBITAL  Final   Special Requests   Final    BOTTLES DRAWN AEROBIC ONLY Blood Culture results may not be optimal due to an inadequate volume of blood received in culture bottles   Culture   Final    NO GROWTH 5 DAYS Performed at [facility], [place] [address] [place], [place] [address]    Report Status 04/06/2021 FINAL  Final  MRSA Next Gen by PCR, Nasal     Status: None   Collection Time: 04/02/21  5:03 AM   Specimen: Nasal Mucosa; Nasal Swab  Result Value Ref Range Status   MRSA by PCR Next Gen NOT DETECTED NOT DETECTED Final    Comment: (NOTE) The GeneXpert MRSA Assay (FDA approved for NASAL specimens only), is one component of a comprehensive MRSA colonization surveillance program. It is not intended to diagnose MRSA infection nor to guide or monitor treatment for MRSA infections. Test performance is not FDA approved in patients less than 66 years old. Performed at [facility], [place] [address] [place], [place] [address]   Urine Culture     Status: None   Collection Time: 04/02/21 10:22 AM   Specimen: Urine, Catheterized  Result Value Ref Range Status   Specimen Description URINE, CATHETERIZED  Final   Special Requests NONE  Final   Culture   Final    NO GROWTH Performed at [facility], [place] [address], [place], [place] [address]    Report Status 04/03/2021 FINAL  Final     Labs:  [id number] Labs   Lab Results  Component Value Date   SARSCOV2NAA NEGATIVE 04/01/2021   [place] NEGATIVE 08/23/2020   [place] NEGATIVE 12/25/2019   [place] Not Detected 06/04/2019      Basic Metabolic Panel: Recent Labs  Lab 04/05/21 0500 04/06/21 0500 04/08/21 0244  NA 140 132* 136  K 3.4* 3.7 3.9  CL 104 96* 104  CO2 $Re'30 27 25  'xNq$ GLUCOSE 102* 107* 112*  BUN 7* 9 21  CREATININE 0.65 0.64 0.70  CALCIUM 9.0 8.8* 9.0  MG  --  1.6*  --   PHOS 2.9  --   --    Liver Function  Tests: Recent Labs  Lab 04/05/21 0500  ALBUMIN 2.6*    CBC: Recent Labs  Lab 04/05/21 0500 04/06/21 0500 04/08/21 0244  WBC 6.2 5.4 6.0  NEUTROABS  --  4.3  --   HGB 9.3* 10.5* 10.8*  HCT 30.5* 32.8* 33.3*  MCV 85.0 82.2 81.8  PLT 248 297 296    BNP: BNP (last 3 results) Recent Labs    04/01/21 1911  BNP 25.6     CBG: Recent Labs  Lab 04/05/21 1534 04/05/21 1920 04/05/21 2309 04/06/21 0739 04/06/21 1615  GLUCAP 84 116* 105* 113* 140*     IMAGING STUDIES CT ABDOMEN PELVIS WO CONTRAST  Result Date: 04/02/2021 CLINICAL DATA:  65 year old male with altered mental status and questionable pneumoperitoneum on chest x-ray. Metastatic non-small cell lung cancer. EXAM: CT ABDOMEN AND PELVIS WITHOUT CONTRAST TECHNIQUE: Multidetector CT imaging of the abdomen and pelvis was performed following the standard protocol without IV contrast. COMPARISON:  Portable chest 0223 hours today. Restaging CT Chest, Abdomen, and Pelvis  12/07/2020. Recent CTA head and neck 1949 hours yesterday. FINDINGS: Lower chest: Trace pleural effusions are new since July. The known left lower lobe lung mass is not included. But there is new patchy, inflammatory opacity in the lateral basal segment of the right lower lobe (series 5, image 7). Small volume pericardial effusion is increased since July and might have intermediate density on series 3, image 4, but significance is unclear. Hepatobiliary: Stable noncontrast liver. Vicarious contrast excretion to the gallbladder which otherwise appears negative. Pancreas: Negative noncontrast pancreas. Spleen: Diminutive, negative. Adrenals/Urinary Tract: Adrenal glands are within normal limits. But there is abnormal pararenal space stranding and/or fluid, and asymmetric delayed nephrograms with residual renal parenchymal contrast and contrast excretion to the collecting systems. Bilateral retroperitoneal/periureteral stranding, although no hydroureter. Contrast is  intermittently present in both ureters to the bladder. The bladder is diminutive but thick-walled, and there is intermediate density filling defect within the bladder on series 7, image 50. But no perivesical stranding. No urinary calculus identified. Stomach/Bowel: Mild retained stool in the rectum but decompressed sigmoid and descending colon. Mild retained gas in the transverse colon. Decompressed right colon. No dilated small bowel. Enteric tube courses to the gastric body. Stomach and duodenum are decompressed. No free air, free fluid, or bowel mesenteric inflammation identified. Vascular/Lymphatic: Aortoiliac calcified atherosclerosis. Vascular patency is not evaluated in the absence of IV contrast. No lymphadenopathy. Reproductive: Urethral catheter in place. Other: No pelvic free fluid. Musculoskeletal: Chronic L5 pars fractures and spondylolisthesis. Chronic disc and endplate degeneration there. Stable visualized osseous structures. IMPRESSION: 1. Negative for pneumoperitoneum or bowel abnormality. 2. But positive for asymmetric delayed nephrograms (worse on the left) with abundant abnormal pararenal space stranding and/or fluid. But no hydronephrosis or obstructing lesion. Bladder decompressed by Foley catheter, although with intermediate density filling defect in the bladder lumen. Constellation suggests Acute Intrinsic Renal Failure. Query hematuria. Urinalysis recommended. 3. Positive also for patchy opacity in the lateral basal segment of the right lower lobe suspicious for Acute Bronchopneumonia. And trace pleural effusions are new since July. 4. And also new small volume pericardial effusion with possible complex fluid density. Query Pericarditis. 5. Enteric tube terminates in the gastric body. Electronically Signed   By: Genevie Ann M.D.   On: 04/02/2021 04:49   CT HEAD W & WO CONTRAST (5MM)  Result Date: 03/14/2021 CLINICAL DATA:  Metastatic lung cancer, follow-up EXAM: CT HEAD WITHOUT AND WITH  CONTRAST TECHNIQUE: Contiguous axial images were obtained from the base of the skull through the vertex without and with intravenous contrast CONTRAST:  83mL OMNIPAQUE IOHEXOL 350 MG/ML SOLN COMPARISON:  11/29/2020 FINDINGS: Brain: There is a new 1.1 cm enhancing lesion of the parasagittal left parietal lobe with mild adjacent edema. Surgical cavity of the high left parietal lobe has contracted. There is thin enhancement at the margins as well as adjacent dural enhancement. No nodular enhancement to suggest recurrent disease. Remaining but decreased adjacent hypoattenuation likely reflects gliosis. There is no acute intracranial hemorrhage or significant mass effect. Right temporal encephalomalacia. Small chronic right caudate infarct. Vascular: Remote aneurysm clipping.  No new finding. Skull: Calvarium is unremarkable apart from bilateral craniotomies. Sinuses/Orbits: No acute finding. Other: Right mastoid tip opacification. IMPRESSION: New 1.1 cm metastasis of the parasagittal left parietal lobe with mild edema. No evidence of recurrent disease at the operative site. Electronically Signed   By: Macy Mis M.D.   On: 03/14/2021 19:57   MR BRAIN W WO CONTRAST  Result Date: 04/02/2021 CLINICAL DATA:  Initial  evaluation for neuro deficit, stroke suspected. History of lung cancer, status post resection. EXAM: MRI HEAD WITHOUT AND WITH CONTRAST TECHNIQUE: Multiplanar, multiecho pulse sequences of the brain and surrounding structures were obtained without and with intravenous contrast. CONTRAST:  5.30mL GADAVIST GADOBUTROL 1 MMOL/ML IV SOLN COMPARISON:  Prior CTs from earlier the same day as well as prior CT from 03/13/2021. FINDINGS: Brain: Examination severely degraded by motion artifact. Postoperative changes from prior left parietal craniotomy for tumor resection again seen. Again seen is an enhancing mass involving the parasagittal left parietal lobe measuring 1.4 x 1.2 cm, consistent with metastasis  (series 12, image 20). Surrounding vasogenic edema seen within the adjacent left parieto-occipital region. Diffuse dural thickening and enhancement about the adjacent craniotomy site favored to be postoperative in nature (series 12, image 24). Subtle increased diffusion abnormality within this region noted as well, which could reflect subtle changes of superimposed seizure (series 3, image 39, 31). There is question of subtly increased diffusion signal abnormality within the medial left thalamus (series 3, image 27), not entirely certain on this motion degraded exam. No definite ADC correlate or abnormal enhancement. No other evidence for acute or subacute infarct elsewhere within the brain. No other acute or chronic intracranial hemorrhage. No other visible mass lesion or abnormal enhancement. No midline shift. No hydrocephalus or extra-axial fluid collection. 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 Vascular: Major intracranial vascular flow voids are grossly maintained at the skull base, better evaluated on prior CTA. Skull and upper cervical spine: Craniocervical junction within normal limits. Bone marrow signal intensity normal. No visible focal marrow replacing lesion. Prior left parietal craniotomy. No scalp soft tissue abnormality. Sinuses/Orbits: Globes and orbital soft tissues grossly within normal limits on this motion degraded exam. Scattered mucosal thickening noted about the right maxillary sinus. Paranasal sinuses are otherwise largely clear. No mastoid effusion. Other: None. IMPRESSION: 1. Technically limited exam due to extensive motion artifact. 2. 1.4 cm metastasis involving the parasagittal left parietal lobe. Surrounding vasogenic edema without midline shift. 3. Diffuse dural thickening and enhancement about the adjacent craniotomy site, favored to be postoperative in nature, although attention at follow-up is recommended. 4. Question subtly increased diffusion signal  abnormality at the left thalamus. Although this finding is not entirely certain given motion artifact on this exam, possible subtle changes of ischemia or possibly seizure could be considered. Additional mildly increased diffusion signal within the adjacent left parieto-occipital region could also reflect changes of acute seizure. Correlation with EEG recommended. 5. No other acute intracranial abnormality. Electronically Signed   By: Jeannine Boga M.D.   On: 04/02/2021 03:25   DG CHEST PORT 1 VIEW  Result Date: 04/03/2021 CLINICAL DATA:  Intubation. EXAM: PORTABLE CHEST 1 VIEW COMPARISON:  April 02, 2021. FINDINGS: The heart size and mediastinal contours are within normal limits. Endotracheal and nasogastric tubes have been removed. Right internal jugular Port-A-Cath is unchanged in position. Mild biapical scarring is noted. Stable rounded density is seen in left lower lobe. The visualized skeletal structures are unremarkable. IMPRESSION: Endotracheal and nasogastric tubes have been removed. Stable nodular density is seen in left lower lobe. Electronically Signed   By: Marijo Conception M.D.   On: 04/03/2021 16:21   DG Chest Portable 1 View  Result Date: 04/02/2021 CLINICAL DATA:  History of lung carcinoma EXAM: PORTABLE CHEST 1 VIEW COMPARISON:  04/01/2021 CT from  02/08/2021 FINDINGS: Cardiac shadow is stable. Gastric catheter is noted within the stomach although the proximal side port lies in  the distal esophagus. This should be advanced several cm deeper into the stomach. Endotracheal tube is noted in satisfactory position. Right chest wall port is noted in satisfactory position. Rounded left lower lobe mass lesion is again identified stable from prior CT. No sizable effusion is noted. No focal infiltrate is seen. Bony structures are within normal limits. IMPRESSION: Tubes and lines as described above. Gastric catheter should be advanced deeper into the stomach. Left lower lobe mass lesion  stable from the prior CT. Electronically Signed   By: Inez Catalina M.D.   On: 04/02/2021 02:31   DG Chest Port 1 View  Result Date: 04/01/2021 CLINICAL DATA:  Questionable sepsis. EXAM: PORTABLE CHEST 1 VIEW COMPARISON:  Chest x-ray 08/30/2020.  CT of the chest 02/08/2021. FINDINGS: Right chest port catheter tip projects over the distal SVC. Chronic appearing scarring in the right apex and paramediastinal region appears unchanged. Bullae are again noted in the left lung apex. Nodular densities in the left lower lung persists, grossly unchanged. Artifact overlies is region. There is no new lung infiltrate, pleural effusion or pneumothorax identified. Cardiac silhouette is within normal limits. No acute fractures are seen. Air under the right hemidiaphragm is indeterminate. IMPRESSION: 1. Air under the right hemidiaphragm is indeterminate and may be within air distended bowel. If there is high clinical concern for free air recommend dedicated abdominal series with decubitus view. 2. Stable nodular densities in the left lower lung. No new lung infiltrate. Electronically Signed   By: Ronney Asters M.D.   On: 04/01/2021 21:28   DG Abd Portable 1V  Result Date: 04/03/2021 CLINICAL DATA:  Encounter for orogastric tube placement. History of metastatic lung cancer. EXAM: PORTABLE ABDOMEN - 1 VIEW COMPARISON:  04/02/2021 and chest CT 02/08/2021 FINDINGS: Endotracheal tube is approximately 1.2 cm above the carina. The OG tube is in the left upper abdomen near the gastric fundus. Again noted is a right jugular Port-A-Cath with the tip in the lower SVC region. Heart size is within normal limits and stable. Again noted is a large nodule in the left lower lobe. Few patchy densities in the right lower lung are unchanged from the recent comparison examination. Negative for pneumothorax. IMPRESSION: 1. OG tube tip in the gastric fundus region. 2.  Endotracheal tube is appropriately positioned. 3. No significant change in  the lung densities. Electronically Signed   By: Markus Daft M.D.   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 EEG to evaluate for seizure Level of alertness:  lethargic AEDs during EEG study: LEV Technical aspects: This EEG study was done with scalp electrodes positioned according to the 10-20 International system of electrode placement. Electrical activity was acquired at a sampling rate of $Remov'500Hz'TwtmlQ$  and reviewed with a high frequency filter of $RemoveB'70Hz'rFJKceJn$  and a low frequency filter of $RemoveB'1Hz'aoQyLSxO$ . EEG data were recorded continuously and digitally stored. Description: EEG showed continuous generalized and maximal left posterior temporal region 3 to 6 Hz theta-delta slowing with overriding 15 to 18 Hz generalized beta activity. Lateralized paretic discharges with overriding fast activity were also noted in left posterior temporal region with fluctuating frequency between 0.5 to 1 Hz which at  times appeared rhythmic without definite evolution consistent with brief ictal-interictal rhythmic discharges.  Hyperventilation and photic stimulation were not performed.   ABNORMALITY -Periodic discharges with overriding fast activity, left hemisphere, maximal left posterior temporal region ( LPD+) -Brief ictal-interictal rhythmic discharges, left hemisphere, maximal left posterior temporal region -Continuous slow, generalized and maximal left posterior temporal region IMPRESSION: This study showed evidence of epileptogenicity and cortical dysfunction arising from left hemisphere, maximal left posterior temporal region which is on the ictal-interictal continuum with high  potential for seizure recurrence.  Additionally there is moderate diffuse encephalopathy, nonspecific etiology.  No definite seizures were seen. Lora Havens   ECHOCARDIOGRAM COMPLETE  Result Date: 04/02/2021    ECHOCARDIOGRAM REPORT   Patient Name:   GUAGE EFFERSON Date of Exam: 04/02/2021 Medical Rec #:  [id number]       Height:       60.0 in Accession #:    [id number]      Weight:       122.6 lb Date of Birth:  1955-12-13      BSA:          1.516 m Patient Age:    17 years        BP:           107/76 mmHg Patient Gender: M               HR:           117 bpm. Exam Location:  Inpatient Procedure: 2D Echo, Cardiac Doppler and Color Doppler Indications:    Pericardial Effusion  History:        Patient has no prior history of Echocardiogram examinations.                 Risk Factors:Hypertension.  Sonographer:    Helmut Muster Referring Phys: [id number] PAULA B SIMPSON  Sonographer Comments: Image acquisition challenging due to patient behavioral factors. and Image acquisition challenging due to patient body habitus. IMPRESSIONS  1. Moderate pericardial effusion that is apical and anterior to the RV. Largest at 17 mm. There is no RV or RA collapse. IVC not well visualized. There is no mitral or tricuspid respirophasic variation in spectral Doppler signal- a marker for increased pericardial pressure.  2. Left ventricular ejection fraction, by estimation, is 60%. The left ventricle has normal function. The left ventricle has no regional wall motion abnormalities. Indeterminate diastolic filling due to E-A fusion.  3. Right ventricular systolic function is normal. The right ventricular size is normal. Tricuspid regurgitation signal is inadequate for assessing PA pressure.  4. The mitral valve is normal in structure. No evidence of mitral valve regurgitation. No evidence of mitral stenosis.  5. The aortic valve is tricuspid. There is mild thickening of the aortic valve. Aortic valve regurgitation is not  visualized. Comparison(s): No prior Echocardiogram. FINDINGS  Left Ventricle: Left ventricular ejection fraction, by estimation, is 60%. The left ventricle has normal function. The left ventricle has no regional wall motion abnormalities. The left ventricular internal cavity size was normal in size. There is no left ventricular hypertrophy. Indeterminate diastolic filling due to E-A fusion. Right Ventricle: The right ventricular size is normal. No increase in right ventricular wall thickness. Right ventricular systolic function is normal. Tricuspid regurgitation signal is inadequate for assessing PA pressure. Left Atrium: Left atrial size was normal in size. Right Atrium: Right atrial size was normal in size. Pericardium: A moderately sized pericardial effusion is present. The pericardial effusion is surrounding the apex. Mitral  Valve: The mitral valve is normal in structure. No evidence of mitral valve regurgitation. No evidence of mitral valve stenosis. Tricuspid Valve: The tricuspid valve is normal in structure. Tricuspid valve regurgitation is not demonstrated. No evidence of tricuspid stenosis. Aortic Valve: The aortic valve is tricuspid. There is mild thickening of the aortic valve. Aortic valve regurgitation is not visualized. Pulmonic Valve: The pulmonic valve was not well visualized. Pulmonic valve regurgitation is not visualized. No evidence of pulmonic stenosis. Aorta: The aortic root is normal in size and structure. IAS/Shunts: The atrial septum is grossly normal.  LEFT VENTRICLE PLAX 2D LVIDd:         4.50 cm     Diastology LVIDs:         3.30 cm     LV e' medial:   4.70 cm/s LV PW:         0.80 cm     LV E/e' medial: 15.1 LV IVS:        0.80 cm LVOT diam:     2.20 cm LV SV:         71 LV SV Index:   47 LVOT Area:     3.80 cm  LV Volumes (MOD) LV vol d, MOD A2C: 51.0 ml LV vol d, MOD A4C: 53.5 ml LV vol s, MOD A2C: 19.8 ml LV vol s, MOD A4C: 19.3 ml LV SV MOD A2C:     31.2 ml LV SV MOD A4C:     53.5 ml  LV SV MOD BP:      34.3 ml RIGHT VENTRICLE RV S prime:     17.40 cm/s TAPSE (M-mode): 2.6 cm LEFT ATRIUM             Index       RIGHT ATRIUM          Index LA diam:        2.70 cm 1.78 cm/m  RA Area:     7.97 cm LA Vol (A2C):   13.2 ml 8.71 ml/m  RA Volume:   12.90 ml 8.51 ml/m LA Vol (A4C):   14.0 ml 9.23 ml/m LA Biplane Vol: 14.3 ml 9.43 ml/m  AORTIC VALVE LVOT Vmax:   123.00 cm/s LVOT Vmean:  69.000 cm/s LVOT VTI:    0.186 m  AORTA Ao Root diam: 3.30 cm Ao Asc diam:  3.10 cm MITRAL VALVE MV Area (PHT): 8.92 cm     SHUNTS MV Decel Time: 85 msec      Systemic VTI:  0.19 m MV E velocity: 71.10 cm/s   Systemic Diam: 2.20 cm MV A velocity: 117.00 cm/s MV E/A ratio:  0.61 Rudean Haskell MD Electronically signed by Rudean Haskell MD Signature Date/Time: 04/02/2021/4:15:24 PM    Final    CT HEAD CODE STROKE WO CONTRAST  Result Date: 04/01/2021 CLINICAL DATA:  Code stroke. EXAM: CT HEAD WITHOUT CONTRAST TECHNIQUE: Contiguous axial images were obtained from the base of the skull through the vertex without intravenous contrast. COMPARISON:  03/13/2021. FINDINGS: Brain: Large area of hypodensity involving the left parietal and occipital lobe; this area previously contained an enhancing lesion and was the subject of recent SRS. No acute hemorrhage, mass effect, or midline shift. No hydrocephalus or extra-axial collection. Vascular: No hyperdense vessel or unexpected calcification. Skull: Status post right frontotemporal and left parietal craniotomies. No acute osseous abnormality. Sinuses/Orbits: Mucosal thickening in the right maxillary sinus. The orbits are unremarkable. Other: Fluid in the right mastoid air cells. ASPECTS [facility] Stroke Program  Early CT Score) - Ganglionic level infarction (caudate, lentiform nuclei, internal capsule, insula, M1-M3 cortex): 7 - Supraganglionic infarction (M4-M6 cortex): 3 Total score (0-10 with 10 being normal): 10 IMPRESSION: 1. Hypodensity in the left parietal  and occipital lobes, which is new from the prior exam but which correlates with an area of recent radiation therapy. This may represent acute infarct or edema related to recent SRS. 2. ASPECTS is 10 Code stroke imaging results were communicated on 04/01/2021 at 7:46 pm to provider Dr. Cheral Marker via secure text paging. Electronically Signed   By: Merilyn Baba M.D.   On: 04/01/2021 19:48   CT ANGIO HEAD NECK W WO CM W PERF (CODE STROKE)  Result Date: 04/01/2021 CLINICAL DATA:  Initial evaluation for neuro deficit, stroke suspected, aphasia. EXAM: CT ANGIOGRAPHY HEAD AND NECK CT PERFUSION BRAIN TECHNIQUE: Multidetector CT imaging of the head and neck was performed using the standard protocol during bolus administration of intravenous contrast. Multiplanar CT image reconstructions and MIPs were obtained to evaluate the vascular anatomy. Carotid stenosis measurements (when applicable) are obtained utilizing NASCET criteria, using the distal internal carotid diameter as the denominator. Multiphase CT imaging of the brain was performed following IV bolus contrast injection. Subsequent parametric perfusion maps were calculated using RAPID software. CONTRAST:  30mL OMNIPAQUE IOHEXOL 350 MG/ML SOLN COMPARISON:  Prior CT from earlier the same day. FINDINGS: CTA NECK FINDINGS Aortic arch: Visualized aortic arch normal caliber with normal 3 vessel morphology. No stenosis about the origin of the great vessels. Right carotid system: Right common and internal carotid arteries widely patent without stenosis, dissection or occlusion. Left carotid system: Left common and internal carotid arteries widely patent without stenosis, dissection or occlusion. Vertebral arteries: Both vertebral arteries arise from the subclavian arteries. No proximal subclavian artery stenosis. Both vertebral arteries widely patent without stenosis, dissection or occlusion. Skeleton: No discrete or worrisome osseous lesions. C3 and C4 vertebral bodies are  fused. Other neck: Asymmetric thickening and enhancement seen about the right masseter muscle (series 5, image 88), nonspecific, but could reflect post radiation changes and/or myositis, which could be either infectious or inflammatory in nature. Adjacent right parotid gland and submandibular glands within normal limits. No other acute abnormality within the neck. Upper chest: Abnormal pleuroparenchymal thickening at the medial right upper lobe/right lung apex, likely reflecting post treatment changes. Severe bolus emphysematous changes within the visualized left lung. 12 mm left upper lobe nodule partially visualized (series 5, image 169). Right-sided Port-A-Cath noted. Review of the MIP images confirms the above findings CTA HEAD FINDINGS Anterior circulation: Both internal carotid arteries widely patent to the termini without stenosis. 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 No residual aneurysm visualized. A1 segments patent bilaterally. Left A1 hypoplastic. Normal anterior communicating artery complex. Anterior cerebral arteries patent to their distal aspects without stenosis. No M1 stenosis or occlusion. Normal MCA bifurcations. Distal MCA branches perfused and symmetric. Posterior circulation: Both V4 segments patent to the vertebrobasilar junction without stenosis. Right PICA patent. Left PICA not well seen. Basilar patent to its distal aspect without stenosis. Superior cerebral arteries patent bilaterally. Both PCAs primarily supplied via the basilar well perfused or distal aspects. Venous sinuses: Grossly patent a large for timing the contrast bolus. Anatomic variants: None significant. Asymmetric left a meningeal enhancement at the left parietal region related to vasogenic edema and known metastasis at this location. Review of the MIP images confirms the above findings CT Brain Perfusion Findings: ASPECTS: 10. CBF (<30%) Volume:  65mL Perfusion (Tmax>6.0s) volume: 76mL  Mismatch Volume: 46mL Infarction Location:Negative CT perfusion with no evidence for acute ischemia. Increased perfusion is seen within the left parietal region on dedicated perfusion maps, presumably related to vasogenic edema and known metastatic lesion at this location. Changes of acute seizure may be contributory. IMPRESSION: 1. Negative CTA for emergent large vessel occlusion. 2. Negative CT perfusion with no evidence for acute ischemia. 3. Mild for age atheromatous disease. No hemodynamically significant or correctable stenosis about the major arterial vasculature of the head and neck. 4. Increased perfusion with leptomeningeal enhancement about the left parietal region, presumably related to vasogenic edema and known metastasis at this location. Superimposed changes of acute seizure could also be considered. 5. 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 No residual aneurysm visualized. 6. Presumed post treatment changes at the right lung apex, with persistent 12 mm left upper lobe nodule. 7. Asymmetric thickening and enhancement about the right masseter muscle, nonspecific, but suspected to reflect post radiation changes. Acute myositis, which could be either infectious or inflammatory, would be the primary differential consideration. 8. Emphysema (ICD10-J43.9). Results discussed by telephone at the time of interpretation on 04/01/2021 at approximately 8:15 p.m. to provider ERIC [facility] , who verbally acknowledged these results. Electronically Signed   By: Jeannine Boga M.D.   On: 04/01/2021 21:07   Rapid EEG  Result Date: 04/02/2021 Lora Havens, MD     04/02/2021 11:49 AM Patient Name: Ethel Veronica MRN: [id number] Epilepsy Attending: Lora Havens Referring Physician/Provider: Dr. 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  Rapid EEG to  evaluate for seizure. Level of alertness: awake AEDs during EEG study: LEV Technical aspects: This EEG study was done with scalp electrodes positioned according to the 10-20 International system of electrode placement. Electrical activity was acquired at a sampling rate of 500Hz  and reviewed with a high frequency filter of 70Hz  and a low frequency filter of 1Hz . EEG data were recorded continuously and digitally stored. Description: EEG showed continuous generalized 2 to 3 Hz delta slowing with overriding 15-18hz  generalized beta activity. Hyperventilation and photic stimulation were not performed.   Of note, study was technically difficult due to electrode artifact. ABNORMALITY - Continuous slow, generalized - Excessive beta, generalized IMPRESSION: This limited ceribell EEG is suggestive of severe diffuse encephalopathy, nonspecific etiology but could be secondary to medications like benzodiazepines.  No seizures or epileptiform discharges were seen throughout the recording. If suspicion for ictal-interictal activity persists, please consider conventional EEG. Priyanka O Yadav    DISCHARGE EXAMINATION: Vitals:   04/11/21 0019 04/11/21 0427 04/11/21 0443 04/11/21 0841  BP: 106/65 110/65  108/68  Pulse: 81 90  100  Resp: 17 17  17   Temp: (!) 97.5 F (36.4 C) (!) 97.4 F (36.3 C)  97.6 F (36.4 C)  TempSrc: Oral Oral  Oral  SpO2: 98% 99%  100%  Weight:   57.2 kg   Height:       General appearance: Awake alert.  Somewhat distracted.  In no distress. Resp: Clear to auscultation bilaterally.  Normal effort Cardio: S1-S2 is normal regular.  No S3-S4.  No rubs murmurs or bruit GI: Abdomen is soft.  Nontender nondistended.  Bowel sounds are present normal.  No masses organomegaly Right greater than left lower extremity weakness noted   DISPOSITION: SNF  Discharge Instructions  Amb Referral to Palliative Care   Complete by: As directed    Ambulatory referral to Neurology   Complete by: As  directed    An appointment is requested in approximately: 8 weeks for seizure in setting of brain met   Call MD for:  difficulty breathing, headache or visual disturbances   Complete by: As directed    Call MD for:  extreme fatigue   Complete by: As directed    Call MD for:  persistant dizziness or light-headedness   Complete by: As directed    Call MD for:  persistant nausea and vomiting   Complete by: As directed    Call MD for:  severe uncontrolled pain   Complete by: As directed    Call MD for:  temperature >100.4   Complete by: As directed    Discharge instructions   Complete by: As directed    Please review instructions on the discharge summary.  You were cared for by a hospitalist during your hospital stay. If you have any questions about your discharge medications or the care you received while you were in the hospital after you are discharged, you can call the unit and asked to speak with the hospitalist on call if the hospitalist that took care of you is not available. Once you are discharged, your primary care physician will handle any further medical issues. Please note that NO REFILLS for any discharge medications will be authorized once you are discharged, as it is imperative that you return to your primary care physician (or establish a relationship with a primary care physician if you do not have one) for your aftercare needs so that they can reassess your need for medications and monitor your lab values. If you do not have a primary care physician, you can call [phone] for a physician referral.   Increase activity slowly   Complete by: As directed          Allergies as of 04/11/2021       Reactions   Other Itching, Other (See Comments)   Seasonal allergies- Itchy eyes, runny nose, sneezing, scratchy throat        Medication List     TAKE these medications    acetaminophen 500 MG tablet Commonly known as: TYLENOL Take 500 mg by mouth every 6 (six) hours as  needed for mild pain or moderate pain.   budesonide-formoterol 80-4.5 MCG/ACT inhaler Commonly known as: SYMBICORT Inhale 2 puffs into the lungs 2 (two) times daily. What changed:  when to take this reasons to take this   clonazePAM 0.25 MG disintegrating tablet Commonly known as: KLONOPIN Take 1 tablet (0.25 mg total) by mouth 2 (two) times daily.   dexamethasone 4 MG tablet Commonly known as: DECADRON Take $RemoveBefor'4mg'[id number]$  three times daily for 5 days, then $RemoveBe'4mg'sbJxKcDGG$  twice daily.   feeding supplement Liqd Take 237 mLs by mouth 2 (two) times daily between meals.   folic acid 1 MG tablet Commonly known as: FOLVITE Take 1 tablet (1 mg total) by mouth daily.   ipratropium-albuterol 0.5-2.5 (3) MG/3ML Soln Commonly known as: DUONEB Take 3 mLs by nebulization every 4 (four) hours as needed.   levETIRAcetam 1000 MG tablet Commonly known as: KEPPRA Take 1 tablet (1,000 mg total) by mouth 2 (two) times daily. What changed:  medication strength how much to take   lidocaine-prilocaine cream Commonly known as: EMLA Apply 1 application topically as needed.   multivitamin tablet Take 1 tablet by mouth daily.  pantoprazole 40 MG tablet Commonly known as: PROTONIX Take 1 tablet (40 mg total) by mouth daily with supper.   polyethylene glycol 17 g packet Commonly known as: MIRALAX / GLYCOLAX Take 17 g by mouth daily. Start taking on: April 12, 2021   prochlorperazine 10 MG tablet Commonly known as: COMPAZINE Take 1 tablet (10 mg total) by mouth every 6 (six) hours as needed for nausea or vomiting.   senna 8.6 MG Tabs tablet Commonly known as: SENOKOT Take 1 tablet (8.6 mg total) by mouth 2 (two) times daily.   Spiriva HandiHaler 18 MCG inhalation capsule Generic drug: tiotropium Place 1 capsule into inhaler and inhale daily.   Ventolin HFA 108 (90 Base) MCG/ACT inhaler Generic drug: albuterol Inhale 2 puffs into the lungs every 4 (four) hours as needed for wheezing or shortness of  breath.          Follow-up Information     Vaslow, Acey Lav, MD Follow up.   Specialties: Psychiatry, Neurology, Oncology Why: office will schedule appointment Contact information: [place] [address] [phone]         Curt Bears, MD Follow up.   Specialty: Oncology Contact information: [place] [address] [phone]                 TOTAL DISCHARGE TIME: 74 minutes  [place]  [facility] Pager on [email or web address]  04/11/2021, 10:15 AM

## 2021-04-12 DIAGNOSIS — C7931 Secondary malignant neoplasm of brain: Secondary | ICD-10-CM | POA: Diagnosis not present

## 2021-04-12 NOTE — Progress Notes (Addendum)
[facility] PROGRESS NOTE   [name] [name] YTK:[id number] DOB: [birth year] DOA: 04/01/2021  PCP: [name], NP  Brief History/Interval Summary: Mr. Longton is a 65 y.o. 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 In the ER, was post-ictal and neurology noted focal seizure activity. Then vomited, developed respiratory failure and was intubated.   11/14: Admitted to medicine initially for status epilepticus, then vomited, had respiratory failure, intubated and transferred to ICU 11/15: cEEG started, gross hematuria noted 11/16: extubated 11/20: transferred out of unit to [facility] 11/21: continuing to slowly improve; discussed with Dr. Mickeal Skinner, Neuro-oncology, and Dr. Hortense Ramal, epileptology, his current deficits are likely related to prolonged post-ictal state and should resolve   Consultants: Neurology  Procedures: EEG    Subjective/Interval History: Patient denies any chest pain or shortness of breath.  No headaches.  No nausea vomiting.       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  Currently on higher dose of Keppra.  Has been seizure-free.  Follow-up with neurology in 6 to 8 weeks in the outpatient setting.  Aspiration pneumonia of right lung/acute respiratory failure with hypoxia The patient's respiratory status is stable.  He has completed course of antibiotics.  He is currently saturating normal on room air.  Non-small cell lung cancer with metastases Initially diagnosed in 2021.  Underwent craniectomy by neurosurgery for brain metastases in April 2022.  Has been getting radiation treatment up until recently.  Followed by Dr. Julien Nordmann.  Brain metastases Underwent radiation minaprine till recently.  Has had cognitive impairment as well as right lower extremity weakness as result of these lesions.   Remains on  dexamethasone.  Plan is to eventually taper it down to twice a day.  Dr. Mickeal Skinner to arrange outpatient follow-up in a week or so.    Acute metabolic encephalopathy This was in the setting of her brain metastases as well as seizures.  Could have been postictal.  Is noted to have some cognitive impairment.  Hopefully will get better with treatment for cancer.  Pericardial effusion Noted incidentally.  Follow-up echocardiogram in 1 month.  Gross hematuria Had gross hematuria in the ICU.  Urology was consulted.  Foley catheter was placed.  They did bladder irrigation.  Foley catheter was removed subsequently.  Voiding trial was successful on 11/21.   Continue Flomax and bethanechol with plans to stop these at discharge. Outpatient follow-up with Dr. Gloriann Loan with urology as needed.  Essential hypertension Monitor blood pressures closely.  Blood pressure is reasonably well controlled.  Currently not on any antihypertensives.  Normocytic anemia Hemoglobin is stable.  COPD Stable.  Moderate malnutrition Nutrition Problem: Moderate Malnutrition Etiology: chronic illness, cancer and cancer related treatments  Signs/Symptoms: moderate fat depletion, moderate muscle depletion, percent weight loss Percent weight loss: 8.4 %  Interventions: Tube feeding, Prostat   DVT Prophylaxis: Lovenox Code Status: Full code Family Communication: Discussed with patient.  No family at bedside Disposition Plan: Waiting on SNF  Status is: Inpatient  Remains inpatient appropriate because: Seizures in the setting of brain metastases, need for short-term rehab    Medications: Scheduled:  bethanechol  10 mg Oral TID   chlorhexidine  15 mL Mouth Rinse BID   Chlorhexidine Gluconate Cloth  6 each Topical Daily   clonazepam  0.25 mg Oral BID   dexamethasone  4 mg Oral Q8H   enoxaparin (LOVENOX) injection  40 mg  Subcutaneous Q24H   feeding supplement  237 mL Oral BID BM   levETIRAcetam  1,000 mg Oral BID    mouth rinse  15 mL Mouth Rinse q12n4p   pantoprazole  40 mg Oral Q1200   polyethylene glycol  17 g Oral Daily   sodium chloride flush  10-40 mL Intracatheter Q12H   tamsulosin  0.4 mg Oral QPC supper   Continuous: [id number] **OR** acetaminophen, docusate, ipratropium-albuterol, labetalol, LORazepam, ondansetron **OR** ondansetron (ZOFRAN) IV, sodium chloride flush  Antibiotics: Anti-infectives (From admission, onward)    Start     Dose/Rate Route Frequency Ordered Stop   04/03/21 1200  cefTRIAXone (ROCEPHIN) 1 g in sodium chloride 0.9 % 100 mL IVPB        1 g 200 mL/hr over 30 Minutes Intravenous Every 24 hours 04/03/21 0836 04/07/21 1316   04/02/21 1930  vancomycin (VANCOREADY) IVPB 1250 mg/250 mL  Status:  Discontinued        1,250 mg 166.7 mL/hr over 90 Minutes Intravenous Every 24 hours 04/01/21 2103 04/02/21 1010   04/02/21 1100  vancomycin (VANCOREADY) IVPB 750 mg/150 mL  Status:  Discontinued        750 mg 150 mL/hr over 60 Minutes Intravenous Every 12 hours 04/02/21 1010 04/03/21 0836   04/02/21 0400  ceFEPIme (MAXIPIME) 2 g in sodium chloride 0.9 % 100 mL IVPB  Status:  Discontinued        2 g 200 mL/hr over 30 Minutes Intravenous Every 8 hours 04/01/21 2103 04/03/21 0836   04/01/21 1915  ceFEPIme (MAXIPIME) 2 g in sodium chloride 0.9 % 100 mL IVPB        2 g 200 mL/hr over 30 Minutes Intravenous  Once 04/01/21 1914 04/01/21 2053   04/01/21 1915  metroNIDAZOLE (FLAGYL) IVPB 500 mg        500 mg 100 mL/hr over 60 Minutes Intravenous  Once 04/01/21 1914 04/02/21 0233   04/01/21 1915  vancomycin (VANCOREADY) IVPB 1250 mg/250 mL        1,250 mg 166.7 mL/hr over 90 Minutes Intravenous  Once 04/01/21 1914 04/02/21 0233       Objective:  Vital Signs  Vitals:   04/12/21 0019 04/12/21 0439 04/12/21 0500 04/12/21 0851  BP: 101/71 101/60  113/70  Pulse: 85 87  94  Resp: 16 16  16   Temp: 97.9 F (36.6 C) 98.8 F (37.1 C)  97.8 F (36.6 C)  TempSrc: Oral Oral   Oral  SpO2: 98% 97%  100%  Weight:   59.6 kg   Height:       No intake or output data in the 24 hours ending 04/12/21 1008  Filed Weights   04/10/21 0445 04/11/21 0443 04/12/21 0500  Weight: 60.1 kg 57.2 kg 59.6 kg    General appearance: Awake alert.  In no distress Resp: Clear to auscultation bilaterally.  Normal effort Cardio: S1-S2 is normal regular.  No S3-S4.  No rubs murmurs or bruit GI: Abdomen is soft.  Nontender nondistended.  Bowel sounds are present normal.  No masses organomegaly Extremities: No edema.   Neurologic: Lower extremity weakness noted right greater than left.      Lab Results:  Data Reviewed: I have personally reviewed following labs and imaging studies  CBC: Recent Labs  Lab 04/06/21 0500 04/08/21 0244  WBC 5.4 6.0  NEUTROABS 4.3  --   HGB 10.5* 10.8*  HCT 32.8* 33.3*  MCV 82.2 81.8  PLT 297 296     Basic Metabolic  Panel: Recent Labs  Lab 04/06/21 0500 04/08/21 0244  NA 132* 136  K 3.7 3.9  CL 96* 104  CO2 27 25  GLUCOSE 107* 112*  BUN 9 21  CREATININE 0.64 0.70  CALCIUM 8.8* 9.0  MG 1.6*  --      GFR: Estimated Creatinine Clearance: 66 mL/min (by C-G formula based on SCr of 0.7 mg/dL).  CBG: Recent Labs  Lab 04/05/21 1534 04/05/21 1920 04/05/21 2309 04/06/21 0739 04/06/21 1615  GLUCAP 84 116* 105* 113* 140*      Recent Results (from the past 240 hour(s))  Urine Culture     Status: None   Collection Time: 04/02/21 10:22 AM   Specimen: Urine, Catheterized  Result Value Ref Range Status   Specimen Description URINE, CATHETERIZED  Final   Special Requests NONE  Final   Culture   Final    NO GROWTH Performed at [facility], [place] [address], [place], [place] [address]    Report Status 04/03/2021 FINAL  Final       Radiology Studies: No results found.     LOS: 10 days   Merek Niu [employer] on [email or web address]  04/12/2021, 10:08 AM

## 2021-04-12 NOTE — Progress Notes (Signed)
Physical Therapy Treatment Patient Details Name: [name] [name] MRN: [id number] DOB: 12-May-1956 Today's Date: 04/12/2021   History of Present Illness 65 y/o male presented to ED on 11/14 after being found outside by neighbor acting abnormally and confused. Recently underwent stereotactic radiosurgery to brain x 6 days ago. CT head showed hypodensity in L parietal and occipital lobes. MRI showed 1.4 cm metastsis involving parasagittal L parietal lobe. Seizure activity in ED. No seizures noted on EEG. Intubated 11/15-11/16. PMH: HTN, anemia, bullous emphysema, COPD, and non-small cell lung cancer status post chemoradiation and now on immunotherapy, hx of crani and R sided deficits from hx of brain tumor.    PT Comments    Pt received in chair, pleasantly agreeable to therapy session and with good tolerance for hallway gait trial and stair trial. Pt needing up to modA for stair sequencing/steadying with RW support for BUE and min guard for gait progression this date. Pt gait speed remains <0.2 m/s, indicating high fall risk and needs safety cues with transfers. Pt continues to benefit from PT services to progress toward functional mobility goals. LCSW notified pt has questions about SNF search.  Recommendations for follow up therapy are one component of a multi-disciplinary discharge planning process, led by the attending physician.  Recommendations may be updated based on patient status, additional functional criteria and insurance authorization.  Follow Up Recommendations  Skilled nursing-short term rehab (<3 hours/day)     Assistance Recommended at Discharge Frequent or constant Supervision/Assistance  Equipment Recommendations  Other (comment) (TBD)    Recommendations for Other Services       Precautions / Restrictions Precautions Precautions: Fall Restrictions Weight Bearing Restrictions: No     Mobility  Bed Mobility               General bed mobility comments: pt  received up in chair, wanted to remain in chair; alarm on.    Transfers Overall transfer level: Needs assistance Equipment used: Rolling walker (2 wheels) Transfers: Sit to/from Stand Sit to Stand: Min guard           General transfer comment: from chair<>RW, cues for hand placement needed    Ambulation/Gait Ambulation/Gait assistance: Min guard Gait Distance (Feet): 75 Feet (x2 with seated break ~3 minutes to rest after stairs) Assistive device: Rolling walker (2 wheels) Gait Pattern/deviations: Step-through pattern;Trunk flexed;Narrow base of support;Decreased dorsiflexion - right;Decreased dorsiflexion - left Gait velocity: ~0.2 m/s Gait velocity interpretation: <1.31 ft/sec, indicative of household ambulator   General Gait Details: Pt requiring heavy cues for proximity to RW and improved heel to toe pattern/step length; very small, low steps and gait speed <0.4 m/s.   Stairs Stairs: Yes Stairs assistance: Mod assist Stair Management: No rails;Step to pattern;Forwards;With walker Number of Stairs: 2 General stair comments: PTA assisting to stabilize RW, pt having difficulty deciding which leg is "good" leg to ascend/descend, decreased stability when descending but able to maintain upright with RW support and gait belt support; very slow pace with mod cues   Wheelchair Mobility    Modified Rankin (Stroke Patients Only)       Balance Overall balance assessment: Needs assistance Sitting-balance support: No upper extremity supported;Feet supported Sitting balance-Leahy Scale: Fair     Standing balance support: Bilateral upper extremity supported;During functional activity;Reliant on assistive device for balance Standing balance-Leahy Scale: Poor Standing balance comment: static standing balance poor/fair, dynamic standing balance poor  Cognition Arousal/Alertness: Awake/alert Behavior During Therapy: WFL for tasks  assessed/performed Overall Cognitive Status: No family/caregiver present to determine baseline cognitive functioning                     Current Attention Level: Focused Memory: Decreased short-term memory Following Commands: Follows one step commands with increased time;Follows one step commands consistently Safety/Judgement: Decreased awareness of safety;Decreased awareness of deficits Awareness: Intellectual Problem Solving: Slow processing;Decreased initiation;Requires verbal cues;Requires tactile cues;Difficulty sequencing General Comments: Improved command following, slow processing but fairly safe throughout               Pertinent Vitals/Pain Pain Assessment: Faces Faces Pain Scale: Hurts a little bit Pain Location: "lower back stiffness" Pain Descriptors / Indicators: Discomfort Pain Intervention(s): Monitored during session;Repositioned    Home Living     Apt has 2 large STE no rails per pt report (>7" each)             PT Goals (current goals can now be found in the care plan section) Acute Rehab PT Goals Patient Stated Goal: did not state PT Goal Formulation: Patient unable to participate in goal setting Time For Goal Achievement: 04/18/21 Progress towards PT goals: Progressing toward goals    Frequency    Min 2X/week      PT Plan Current plan remains appropriate   AM-PAC PT "6 Clicks" Mobility   Outcome Measure  Help needed turning from your back to your side while in a flat bed without using bedrails?: A Little Help needed moving from lying on your back to sitting on the side of a flat bed without using bedrails?: A Little Help needed moving to and from a bed to a chair (including a wheelchair)?: A Little Help needed standing up from a chair using your arms (e.g., wheelchair or bedside chair)?: A Little Help needed to walk in hospital room?: A Lot Help needed climbing 3-5 steps with a railing? : Total 6 Click Score: 15    End of  Session Equipment Utilized During Treatment: Gait belt Activity Tolerance: Patient tolerated treatment well Patient left: with call bell/phone within reach;in chair;with chair alarm set Nurse Communication: Mobility status;Other (comment) (case manager notified he has questions about when he is leaving for rehab) PT Visit Diagnosis: Unsteadiness on feet (R26.81);Muscle weakness (generalized) (M62.81);Difficulty in walking, not elsewhere classified (R26.2);Other symptoms and signs involving the nervous system (R29.898)     Time: 1962-2297 PT Time Calculation (min) (ACUTE ONLY): 18 min  Charges:  $Gait Training: 8-22 mins                     Sunshine Mackowski P., PTA Acute Rehabilitation Services Pager: [phone] Office: [place] 04/12/2021, 5:27 PM

## 2021-04-12 NOTE — TOC Progression Note (Signed)
Transition of Care [facility]) - Progression Note    Patient Details  Name: [name] [name] MRN: [id number] Date of Birth: [birth year]  Transition of Care [facility]) CM/SW [place], [place] Phone Number: 04/12/2021, 9:39 AM  Clinical Narrative:   CSW spoke with Admissions at [facility]. Josem Kaufmann is still pending, and Medicaid is saying there's a duplicate authorization. Eddie [place] to attempt to fix authorization request today and will update CSW with information. CSW updated MD with barrier to discharge. CSW to follow.    Expected Discharge Plan: Skilled Nursing Facility Barriers to Discharge: Continued Medical Work up, SNF Pending bed offer  Expected Discharge Plan and Services Expected Discharge Plan: [place] In-house Referral: Clinical Social Work   Post Acute Care Choice: [place] Living arrangements for the past 2 months: Apartment                                       Social Determinants of Health (SDOH) Interventions    Readmission Risk Interventions Readmission Risk Prevention Plan 09/20/2020  Transportation Screening Complete  PCP or Specialist Appt within 3-5 Days Complete  HRI or [place] Complete  Social Work Consult for [place] Planning/Counseling Complete  Palliative Care Screening Complete  Medication Review [employer]) Complete  Some recent data might be hidden

## 2021-04-13 DIAGNOSIS — C7931 Secondary malignant neoplasm of brain: Secondary | ICD-10-CM | POA: Diagnosis not present

## 2021-04-13 NOTE — Progress Notes (Addendum)
[facility] PROGRESS NOTE   Herron Fero JXB:[id number] DOB: 1956/03/12 DOA: 04/01/2021  PCP: [name], NP  Brief History/Interval Summary: Mr. [name] [name] is a 65 y.o. 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 In the ER, was post-ictal and neurology noted focal seizure activity. Then vomited, developed respiratory failure and was intubated.   11/14: Admitted to medicine initially for status epilepticus, then vomited, had respiratory failure, intubated and transferred to ICU 11/15: cEEG started, gross hematuria noted 11/16: extubated 11/20: transferred out of unit to [facility] [place] 11/21: continuing to slowly improve; discussed with Dr. Mickeal Skinner, Neuro-oncology, and Dr. Hortense Ramal, epileptology, his current deficits are likely related to prolonged post-ictal state and should resolve   Consultants: Neurology  Procedures: EEG    Subjective/Interval History: Patient without any new complaints this morning.  Denies any chest pain or shortness of breath.  No headaches.    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  Currently on higher dose of Keppra.  Has been seizure-free.  Follow-up with neurology in 6 to 8 weeks in the outpatient setting.  Aspiration pneumonia of right lung/acute respiratory failure with hypoxia The patient's respiratory status is stable.  He has completed course of antibiotics.  He is currently saturating normal on room air.  Non-small cell lung cancer with metastases Initially diagnosed in 2021.  Underwent craniectomy by neurosurgery for brain metastases in April 2022.  Has been getting radiation treatment up until recently.  Followed by Dr. Julien Nordmann.  Brain metastases Underwent radiation minaprine till recently.  Has had cognitive impairment as well as right lower extremity weakness as result of these lesions.    Remains on dexamethasone.  Plan is to eventually taper it down to twice a day.  Dr. Mickeal Skinner to arrange outpatient follow-up in a week or so.    Acute metabolic encephalopathy This was in the setting of her brain metastases as well as seizures.  Could have been postictal.  He is noted to have some cognitive impairment. Hopefully will get better with treatment for cancer.  Pericardial effusion Noted incidentally.  Follow-up echocardiogram in 1 month.  Gross hematuria Had gross hematuria in the ICU.  Urology was consulted.  Foley catheter was placed.  They did bladder irrigation.  Foley catheter was removed subsequently.  Voiding trial was successful on 11/21.   Continue Flomax and bethanechol with plans to stop these at discharge. Outpatient follow-up with Dr. Gloriann Loan with urology as needed.  Essential hypertension Pressure is reasonably well controlled off of antihypertensives.    Normocytic anemia Hemoglobin is stable.  Recheck labs tomorrow.  COPD Stable.  Moderate malnutrition Nutrition Problem: Moderate Malnutrition Etiology: chronic illness, cancer and cancer related treatments  Signs/Symptoms: moderate fat depletion, moderate muscle depletion, percent weight loss Percent weight loss: 8.4 %  Interventions: Tube feeding, Prostat   DVT Prophylaxis: Lovenox Code Status: Full code Family Communication: Discussed with patient.  No family at bedside Disposition Plan: Waiting on SNF  Status is: Inpatient  Remains inpatient appropriate because: Seizures in the setting of brain metastases, need for short-term rehab    Medications: Scheduled:  bethanechol  10 mg Oral TID   chlorhexidine  15 mL Mouth Rinse BID   Chlorhexidine Gluconate Cloth  6 each Topical Daily   clonazepam  0.25 mg Oral BID   dexamethasone  4 mg Oral Q8H   enoxaparin (LOVENOX) injection  40 mg Subcutaneous Q24H  feeding supplement  237 mL Oral BID BM   levETIRAcetam  1,000 mg Oral BID   mouth rinse  15  mL Mouth Rinse q12n4p   pantoprazole  40 mg Oral Q1200   polyethylene glycol  17 g Oral Daily   sodium chloride flush  10-40 mL Intracatheter Q12H   tamsulosin  0.4 mg Oral QPC supper   Continuous: [id number] **OR** acetaminophen, docusate, ipratropium-albuterol, labetalol, LORazepam, ondansetron **OR** ondansetron (ZOFRAN) IV, sodium chloride flush  Antibiotics: Anti-infectives (From admission, onward)    Start     Dose/Rate Route Frequency Ordered Stop   04/03/21 1200  cefTRIAXone (ROCEPHIN) 1 g in sodium chloride 0.9 % 100 mL IVPB        1 g 200 mL/hr over 30 Minutes Intravenous Every 24 hours 04/03/21 0836 04/07/21 1316   04/02/21 1930  vancomycin (VANCOREADY) IVPB 1250 mg/250 mL  Status:  Discontinued        1,250 mg 166.7 mL/hr over 90 Minutes Intravenous Every 24 hours 04/01/21 2103 04/02/21 1010   04/02/21 1100  vancomycin (VANCOREADY) IVPB 750 mg/150 mL  Status:  Discontinued        750 mg 150 mL/hr over 60 Minutes Intravenous Every 12 hours 04/02/21 1010 04/03/21 0836   04/02/21 0400  ceFEPIme (MAXIPIME) 2 g in sodium chloride 0.9 % 100 mL IVPB  Status:  Discontinued        2 g 200 mL/hr over 30 Minutes Intravenous Every 8 hours 04/01/21 2103 04/03/21 0836   04/01/21 1915  ceFEPIme (MAXIPIME) 2 g in sodium chloride 0.9 % 100 mL IVPB        2 g 200 mL/hr over 30 Minutes Intravenous  Once 04/01/21 1914 04/01/21 2053   04/01/21 1915  metroNIDAZOLE (FLAGYL) IVPB 500 mg        500 mg 100 mL/hr over 60 Minutes Intravenous  Once 04/01/21 1914 04/02/21 0233   04/01/21 1915  vancomycin (VANCOREADY) IVPB 1250 mg/250 mL        1,250 mg 166.7 mL/hr over 90 Minutes Intravenous  Once 04/01/21 1914 04/02/21 0233       Objective:  Vital Signs  Vitals:   04/12/21 2013 04/13/21 0009 04/13/21 0405 04/13/21 0836  BP: 96/62 107/64 105/65 112/69  Pulse: 90 82 83 100  Resp: 18 16 18 18   Temp: 98.2 F (36.8 C) 97.9 F (36.6 C) 97.8 F (36.6 C) (!) 97.4 F (36.3 C)   TempSrc: Oral Oral Oral Oral  SpO2: 98% 97% 98% 100%  Weight:   59.7 kg   Height:        Intake/Output Summary (Last 24 hours) at 04/13/2021 1034 Last data filed at 04/13/2021 0831 Gross per 24 hour  Intake 720 ml  Output 1600 ml  Net -880 ml    Filed Weights   04/11/21 0443 04/12/21 0500 04/13/21 0405  Weight: 57.2 kg 59.6 kg 59.7 kg    General appearance: Awake alert.  In no distress Resp: Clear to auscultation bilaterally.  Normal effort Cardio: S1-S2 is normal regular.  No S3-S4.  No rubs murmurs or bruit GI: Abdomen is soft.  Nontender nondistended.  Bowel sounds are present normal.  No masses organomegaly Extremities: No edema.  Neurologic: Right lower extremity greater than left lower extremity weakness as before.      Lab Results:  Data Reviewed: I have personally reviewed following labs and imaging studies  CBC: Recent Labs  Lab 04/08/21 0244  WBC 6.0  HGB 10.8*  HCT 33.3*  MCV  81.8  PLT 296     Basic Metabolic Panel: Recent Labs  Lab 04/08/21 0244  NA 136  K 3.9  CL 104  CO2 25  GLUCOSE 112*  BUN 21  CREATININE 0.70  CALCIUM 9.0     GFR: Estimated Creatinine Clearance: 66 mL/min (by C-G formula based on SCr of 0.7 mg/dL).  CBG: Recent Labs  Lab 04/06/21 1615  GLUCAP 140*      No results found for this or any previous visit (from the past 240 hour(s)).     Radiology Studies: No results found.     LOS: 11 days   Tahliyah Anagnos [employer] on [email or web address]  04/13/2021, 10:34 AM

## 2021-04-14 DIAGNOSIS — C7931 Secondary malignant neoplasm of brain: Secondary | ICD-10-CM | POA: Diagnosis not present

## 2021-04-14 LAB — CBC
HCT: 31.3 % — ABNORMAL LOW (ref 39.0–52.0)
Hemoglobin: 10.2 g/dL — ABNORMAL LOW (ref 13.0–17.0)
MCH: 26.2 pg (ref 26.0–34.0)
MCHC: 32.6 g/dL (ref 30.0–36.0)
MCV: 80.3 fL (ref 80.0–100.0)
Platelets: 325 10*3/uL (ref 150–400)
RBC: 3.9 MIL/uL — ABNORMAL LOW (ref 4.22–5.81)
RDW: 15.4 % (ref 11.5–15.5)
WBC: 10 10*3/uL (ref 4.0–10.5)
nRBC: 0 % (ref 0.0–0.2)

## 2021-04-14 LAB — BASIC METABOLIC PANEL
Anion gap: 9 (ref 5–15)
BUN: 18 mg/dL (ref 8–23)
CO2: 27 mmol/L (ref 22–32)
Calcium: 9.2 mg/dL (ref 8.9–10.3)
Chloride: 98 mmol/L (ref 98–111)
Creatinine, Ser: 0.74 mg/dL (ref 0.61–1.24)
GFR, Estimated: >60 mL/min (ref >60)
Glucose, Bld: 115 mg/dL — ABNORMAL HIGH (ref 70–99)
Potassium: 3.9 mmol/L (ref 3.5–5.1)
Sodium: 134 mmol/L — ABNORMAL LOW (ref 135–145)

## 2021-04-14 LAB — GLUCOSE, CAPILLARY: Glucose-Capillary: 109 mg/dL — ABNORMAL HIGH (ref 70–99)

## 2021-04-14 NOTE — Progress Notes (Signed)
[facility] PROGRESS NOTE   [name] [name] DVV:[id number] DOB: [birth year] DOA: 04/01/2021  PCP: [name], NP  Brief History/Interval Summary: [name] [name] is a 65 y.o. 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 In the ER, was post-ictal and neurology noted focal seizure activity. Then vomited, developed respiratory failure and was intubated.   11/14: Admitted to medicine initially for status epilepticus, then vomited, had respiratory failure, intubated and transferred to ICU 11/15: cEEG started, gross hematuria noted 11/16: extubated 11/20: transferred out of unit to [facility] 11/21: continuing to slowly improve; discussed with Dr. Mickeal Skinner, Neuro-oncology, and Dr. Hortense Ramal, epileptology, his current deficits are likely related to prolonged post-ictal state and should resolve   Consultants: Neurology  Procedures: EEG    Subjective/Interval History: Patient without any complaints this morning.  Waiting to go to rehab.  No seizure reported by nursing staff.    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  Currently on higher dose of Keppra.  Has been seizure-free.  Follow-up with neurology in 6 to 8 weeks in the outpatient setting.  Aspiration pneumonia of right lung/acute respiratory failure with hypoxia The patient's respiratory status is stable.  He has completed course of antibiotics.  He is currently saturating normal on room air.  Non-small cell lung cancer with metastases Initially diagnosed in 2021.  Underwent craniectomy by neurosurgery for brain metastases in April 2022.  Has been getting radiation treatment up until recently.  Followed by Dr. Julien Nordmann.  Brain metastases Underwent radiation minaprine till recently.  Has had cognitive impairment as well as right lower extremity weakness as result of these lesions.    Remains on dexamethasone.  Plan is to eventually taper it down to twice a day.  Dr. Mickeal Skinner to arrange outpatient follow-up in a week or so.    Acute metabolic encephalopathy This was in the setting of her brain metastases as well as seizures.  Could have been postictal.  He is noted to have some cognitive impairment.  Hopefully will get better with treatment for cancer.  Pericardial effusion Noted incidentally.  Follow-up echocardiogram in 1 month.  Gross hematuria Had gross hematuria in the ICU.  Urology was consulted.  Foley catheter was placed.  They did bladder irrigation.  Foley catheter was removed subsequently.  Voiding trial was successful on 11/21.   Continue Flomax and bethanechol with plans to stop these at discharge. Outpatient follow-up with Dr. Gloriann Loan with urology as needed.  Essential hypertension Blood pressure is reasonably well controlled off of antihypertensives.    Normocytic anemia Hemoglobin is stable.  COPD Stable.  Moderate malnutrition Nutrition Problem: Moderate Malnutrition Etiology: chronic illness, cancer and cancer related treatments  Signs/Symptoms: moderate fat depletion, moderate muscle depletion, percent weight loss Percent weight loss: 8.4 %  Interventions: Tube feeding, Prostat   DVT Prophylaxis: Lovenox Code Status: Full code Family Communication: Discussed with patient.  No family at bedside Disposition Plan: Waiting on SNF  Status is: Inpatient  Remains inpatient appropriate because: Seizures in the setting of brain metastases, need for short-term rehab    Medications: Scheduled:  bethanechol  10 mg Oral TID   chlorhexidine  15 mL Mouth Rinse BID   Chlorhexidine Gluconate Cloth  6 each Topical Daily   clonazepam  0.25 mg Oral BID   dexamethasone  4 mg Oral Q8H   enoxaparin (LOVENOX) injection  40 mg Subcutaneous Q24H   feeding  supplement  237 mL Oral BID BM   levETIRAcetam  1,000 mg Oral BID   mouth rinse  15 mL Mouth Rinse  q12n4p   pantoprazole  40 mg Oral Q1200   polyethylene glycol  17 g Oral Daily   sodium chloride flush  10-40 mL Intracatheter Q12H   tamsulosin  0.4 mg Oral QPC supper   Continuous: DVV:OHYWVPXTGGYIR **OR** acetaminophen, docusate, ipratropium-albuterol, labetalol, LORazepam, ondansetron **OR** ondansetron (ZOFRAN) IV, sodium chloride flush  Antibiotics: Anti-infectives (From admission, onward)    Start     Dose/Rate Route Frequency Ordered Stop   04/03/21 1200  cefTRIAXone (ROCEPHIN) 1 g in sodium chloride 0.9 % 100 mL IVPB        1 g 200 mL/hr over 30 Minutes Intravenous Every 24 hours 04/03/21 0836 04/07/21 1316   04/02/21 1930  vancomycin (VANCOREADY) IVPB 1250 mg/250 mL  Status:  Discontinued        1,250 mg 166.7 mL/hr over 90 Minutes Intravenous Every 24 hours 04/01/21 2103 04/02/21 1010   04/02/21 1100  vancomycin (VANCOREADY) IVPB 750 mg/150 mL  Status:  Discontinued        750 mg 150 mL/hr over 60 Minutes Intravenous Every 12 hours 04/02/21 1010 04/03/21 0836   04/02/21 0400  ceFEPIme (MAXIPIME) 2 g in sodium chloride 0.9 % 100 mL IVPB  Status:  Discontinued        2 g 200 mL/hr over 30 Minutes Intravenous Every 8 hours 04/01/21 2103 04/03/21 0836   04/01/21 1915  ceFEPIme (MAXIPIME) 2 g in sodium chloride 0.9 % 100 mL IVPB        2 g 200 mL/hr over 30 Minutes Intravenous  Once 04/01/21 1914 04/01/21 2053   04/01/21 1915  metroNIDAZOLE (FLAGYL) IVPB 500 mg        500 mg 100 mL/hr over 60 Minutes Intravenous  Once 04/01/21 1914 04/02/21 0233   04/01/21 1915  vancomycin (VANCOREADY) IVPB 1250 mg/250 mL        1,250 mg 166.7 mL/hr over 90 Minutes Intravenous  Once 04/01/21 1914 04/02/21 0233       Objective:  Vital Signs  Vitals:   04/14/21 0000 04/14/21 0410 04/14/21 0412 04/14/21 0805  BP: 95/63 (!) 143/86  104/67  Pulse: 82 98  95  Resp: 18 20  18   Temp:  97.9 F (36.6 C)  97.9 F (36.6 C)  TempSrc: Oral Oral  Oral  SpO2: 98% 98%  100%  Weight:   61.1 kg    Height:        Intake/Output Summary (Last 24 hours) at 04/14/2021 0918 Last data filed at 04/14/2021 0412 Gross per 24 hour  Intake 720 ml  Output 2580 ml  Net -1860 ml    Filed Weights   04/12/21 0500 04/13/21 0405 04/14/21 0412  Weight: 59.6 kg 59.7 kg 61.1 kg    Awake alert.  In no distress Lungs are clear to auscultation bilaterally. S1-S2 is normal regular. Abdomen is soft. Right lower extremity weakness greater than left lower extremity.     Lab Results:  Data Reviewed: I have personally reviewed following labs and imaging studies  CBC: Recent Labs  Lab 04/08/21 0244 04/14/21 0155  WBC 6.0 10.0  HGB 10.8* 10.2*  HCT 33.3* 31.3*  MCV 81.8 80.3  PLT 296 325     Basic Metabolic Panel: Recent Labs  Lab 04/08/21 0244 04/14/21 0155  NA 136 134*  K 3.9 3.9  CL 104 98  CO2 25 27  GLUCOSE 112* 115*  BUN 21 18  CREATININE 0.70 0.74  CALCIUM 9.0 9.2     GFR: Estimated Creatinine Clearance: 71.8 mL/min (by C-G formula based on SCr of 0.74 mg/dL).  CBG: No results for input(s): GLUCAP in the last 168 hours.    No results found for this or any previous visit (from the past 240 hour(s)).     Radiology Studies: No results found.     LOS: 12 days   Sylvana Bonk [employer] on [email or web address]  04/14/2021, 9:18 AM

## 2021-04-14 NOTE — TOC Progression Note (Signed)
Transition of Care [facility]) - Progression Note    Patient Details  Name: Rashad Auld MRN: [id number] Date of Birth: Jul 23, 1955  Transition of Care [facility]) CM/SW [place], [place] Phone Number: 04/14/2021, 3:58 PM  Clinical Narrative:    CSW was notified by RN that pt's POA would like an update. CSW called and spoke with Vickii Chafe, and advised her that at this time we were just waiting on authorization. She stated that pt told her he owed 500$ and she asked if CSW knew anything about it. Per Vickii Chafe he stated that someone kept saying "give me my money". CSW noted there was no documentation of money owed so it may be confusion. Peggy noted understanding and TOC will continue to follow for DC planning.   Expected Discharge Plan: Skilled Nursing Facility Barriers to Discharge: Continued Medical Work up, SNF Pending bed offer  Expected Discharge Plan and Services Expected Discharge Plan: [place] In-house Referral: Clinical Social Work   Post Acute Care Choice: [place] Living arrangements for the past 2 months: Apartment                                       Social Determinants of Health (SDOH) Interventions    Readmission Risk Interventions Readmission Risk Prevention Plan 09/20/2020  Transportation Screening Complete  PCP or Specialist Appt within 3-5 Days Complete  HRI or [place] Complete  Social Work Consult for [place] Planning/Counseling Complete  Palliative Care Screening Complete  Medication Review [employer] photographer) Complete  Some recent data might be hidden

## 2021-04-15 DIAGNOSIS — C7931 Secondary malignant neoplasm of brain: Secondary | ICD-10-CM | POA: Diagnosis not present

## 2021-04-15 LAB — RESP PANEL BY RT-PCR (FLU A&B, COVID) ARPGX2
Influenza A by PCR: NEGATIVE
Influenza B by PCR: NEGATIVE
SARS Coronavirus 2 by RT PCR: NEGATIVE

## 2021-04-15 LAB — GLUCOSE, CAPILLARY: Glucose-Capillary: 96 mg/dL (ref 70–99)

## 2021-04-15 MED ORDER — HEPARIN SOD (PORK) LOCK FLUSH 100 UNIT/ML IV SOLN
500.0000 [IU] | INTRAVENOUS | Status: AC | PRN
  Administered 2021-04-15: 12:00:00 500 [IU]
  Filled 2021-04-15: qty 5

## 2021-04-15 NOTE — Progress Notes (Signed)
Attempted to call report x 2 with no answer.

## 2021-04-15 NOTE — TOC Transition Note (Signed)
Transition of Care [facility]) - CM/SW Discharge Note   Patient Details  Name: [name] [name] MRN: [id number] Date of Birth: 19-Jul-1955  Transition of Care [facility]) CM/SW Contact:  Coralee Pesa, [place] Phone Number: 04/15/2021, 11:47 AM   Clinical Narrative:    Pt to be transported to [place] via [place].  Nurse to call report to [phone] ask for unit manager of [address].  Rm 209.   Final next level of care: [place] Barriers to Discharge: Barriers Resolved   Patient Goals and CMS Choice        Discharge Placement              Patient chooses bed at: [facility] Patient to be transferred to facility by: [place] Name of family member notified: Peggy Patient and family notified of of transfer: 04/15/21  Discharge Plan and Services In-house Referral: Clinical Social Work   Post Acute Care Choice: [place]                               Social Determinants of Health (SDOH) Interventions     Readmission Risk Interventions Readmission Risk Prevention Plan 09/20/2020  Transportation Screening Complete  PCP or Specialist Appt within 3-5 Days Complete  HRI or [place] Complete  Social Work Consult for [place] Planning/Counseling Complete  Palliative Care Screening Complete  Medication Review Press photographer) Complete  Some recent data might be hidden

## 2021-04-15 NOTE — Progress Notes (Addendum)
Report called to [name] [name] at [place].  Answered all questions.  Waiting for transport. Prescription in packet.

## 2021-04-15 NOTE — Progress Notes (Signed)
Physical Therapy Treatment Patient Details Name: [name] [name] MRN: [id number] DOB: May 12, 1956 Today's Date: 04/15/2021   History of Present Illness 65 y/o male presented to ED on 11/14 after being found outside by neighbor acting abnormally and confused. Recently underwent stereotactic radiosurgery to brain x 6 days ago. CT head showed hypodensity in L parietal and occipital lobes. MRI showed 1.4 cm metastsis involving parasagittal L parietal lobe. Seizure activity in ED. No seizures noted on EEG. Intubated 11/15-11/16. PMH: HTN, anemia, bullous emphysema, COPD, and non-small cell lung cancer status post chemoradiation and now on immunotherapy, hx of crani and R sided deficits from hx of brain tumor.    PT Comments    Pt progressing well towards goals. Pt remains to have delayed processing, impaired depth perception, decreased R LE WBing tolerance leading to antalgic gait pattern, and R sided inattention. Continue to recommend SNF upon d/c to achieve safe mod I level of function to return home alone. Acute PT to cont to follow.    Recommendations for follow up therapy are one component of a multi-disciplinary discharge planning process, led by the attending physician.  Recommendations may be updated based on patient status, additional functional criteria and insurance authorization.  Follow Up Recommendations  Skilled nursing-short term rehab (<3 hours/day)     Assistance Recommended at Discharge Frequent or constant Supervision/Assistance  Equipment Recommendations  Other (comment) (TBD)    Recommendations for Other Services       Precautions / Restrictions Precautions Precautions: Fall Restrictions Weight Bearing Restrictions: No (however pt with noted R LE limp)     Mobility  Bed Mobility               General bed mobility comments: patient received in chair    Transfers Overall transfer level: Needs assistance Equipment used: Rolling walker (2  wheels) Transfers: Sit to/from Stand Sit to Stand: Min guard           General transfer comment: verbal cues to push up from arm rests and to reach back from arm rests, pt did pull up from bar in bathroom    Ambulation/Gait Ambulation/Gait assistance: Min guard;Min assist Gait Distance (Feet): 150 Feet (x1, 120x1) Assistive device: Rolling walker (2 wheels);1 person hand held assist Gait Pattern/deviations: Step-through pattern;Trunk flexed;Narrow base of support;Decreased dorsiflexion - right Gait velocity: slow Gait velocity interpretation: <1.31 ft/sec, indicative of household ambulator   General Gait Details: verbal cues to stay in walker and stand up straight, increased step length on the L to match the R. noted antalgia with R LE WBing, trialed R HHA only, pt with decreased step height and length, more antalgia with R LE amb   Stairs             Wheelchair Mobility    Modified Rankin (Stroke Patients Only)       Balance Overall balance assessment: Needs assistance Sitting-balance support: No upper extremity supported;Feet supported Sitting balance-Leahy Scale: Good     Standing balance support: Bilateral upper extremity supported;During functional activity;Reliant on assistive device for balance Standing balance-Leahy Scale: Fair Standing balance comment: pt stood at sink to wash hands and leaned on sink with abdomen                            Cognition Arousal/Alertness: Awake/alert Behavior During Therapy: WFL for tasks assessed/performed Overall Cognitive Status: No family/caregiver present to determine baseline cognitive functioning Area of Impairment: Memory;Safety/judgement;Awareness;Problem solving  Orientation Level: Disoriented to;Time;Situation Current Attention Level: Focused Memory: Decreased short-term memory Following Commands: Follows one step commands with increased time;Follows one step commands  consistently Safety/Judgement: Decreased awareness of safety;Decreased awareness of deficits Awareness: Emergent Problem Solving: Slow processing;Decreased initiation;Requires verbal cues;Requires tactile cues;Difficulty sequencing General Comments: pt with noted R sided intattention        Exercises      General Comments General comments (skin integrity, edema, etc.): suspect pt to have visual impairement as noted when pt reaching for object with R UE and missing them indicating depth perception impairment. When pt asked if he was having vision deficits pt stated "sometimes I can't see the people on the tv". Spoke with OT to evaluate vision further, pt assited to commode, supervision for hygiene, unable to find handle to flush toliet      Pertinent Vitals/Pain Pain Assessment: Faces Faces Pain Scale: Hurts a little bit Pain Location: R LE when WBing Pain Descriptors / Indicators: Discomfort (antalgic gait) Pain Intervention(s): Monitored during session    Home Living                          Prior Function            PT Goals (current goals can now be found in the care plan section) Acute Rehab PT Goals PT Goal Formulation: Patient unable to participate in goal setting Time For Goal Achievement: 04/18/21 Potential to Achieve Goals: Fair Progress towards PT goals: Progressing toward goals    Frequency    Min 2X/week      PT Plan Current plan remains appropriate    Co-evaluation              AM-PAC PT "6 Clicks" Mobility   Outcome Measure  Help needed turning from your back to your side while in a flat bed without using bedrails?: A Little Help needed moving from lying on your back to sitting on the side of a flat bed without using bedrails?: A Little Help needed moving to and from a bed to a chair (including a wheelchair)?: A Little Help needed standing up from a chair using your arms (e.g., wheelchair or bedside chair)?: A Little Help needed to  walk in hospital room?: A Lot Help needed climbing 3-5 steps with a railing? : Total 6 Click Score: 15    End of Session Equipment Utilized During Treatment: Gait belt Activity Tolerance: Patient tolerated treatment well Patient left: with call bell/phone within reach;in chair;with chair alarm set Nurse Communication: Mobility status PT Visit Diagnosis: Unsteadiness on feet (R26.81);Muscle weakness (generalized) (M62.81);Difficulty in walking, not elsewhere classified (R26.2);Other symptoms and signs involving the nervous system (Z66.294)     Time: 7654-6503 PT Time Calculation (min) (ACUTE ONLY): 22 min  Charges:  $Gait Training: 8-22 mins                     Kittie Plater, PT, DPT Acute Rehabilitation Services Pager #: [phone] Office #: [phone]    Berline Lopes 04/15/2021, 1:14 PM

## 2021-04-15 NOTE — Progress Notes (Signed)
Occupational Therapy Treatment Patient Details Name: [name] [name] MRN: [id number] DOB: Nov 01, 1955 Today's Date: 04/15/2021   History of present illness 65 y/o male presented to ED on 11/14 after being found outside by neighbor acting abnormally and confused. Recently underwent stereotactic radiosurgery to brain x 6 days ago. CT head showed hypodensity in L parietal and occipital lobes. MRI showed 1.4 cm metastsis involving parasagittal L parietal lobe. Seizure activity in ED. No seizures noted on EEG. Intubated 11/15-11/16. PMH: HTN, anemia, bullous emphysema, COPD, and non-small cell lung cancer status post chemoradiation and now on immunotherapy, hx of crani and R sided deficits from hx of brain tumor.   OT comments  Patient received in recliner and agreeable to OT session. Patient was able to ambulate to sink for grooming with verbal cues to locate items to right and required seated rest break before completing. Patient was able to donn T-shirt and donn gown with verbal cues. Toilet transfers addressed with simulated trials with obstacles placed to address visual scanning and safety. Patient required cues for pushing up to stand, reaching back to sit, and attention to right.  Acute OT to continue to follow.    Recommendations for follow up therapy are one component of a multi-disciplinary discharge planning process, led by the attending physician.  Recommendations may be updated based on patient status, additional functional criteria and insurance authorization.    Follow Up Recommendations  Skilled nursing-short term rehab (<3 hours/day)    Assistance Recommended at Discharge Frequent or constant Supervision/Assistance  Equipment Recommendations  BSC/3in1    Recommendations for Other Services      Precautions / Restrictions Precautions Precautions: Fall Restrictions Weight Bearing Restrictions: No       Mobility Bed Mobility               General bed mobility comments:  patient received in chair    Transfers Overall transfer level: Needs assistance Equipment used: Rolling walker (2 wheels) Transfers: Sit to/from Stand Sit to Stand: Min guard           General transfer comment: performed simulated toilet transfer with chair with arms and recliner transfers with frequent cues for safety     Balance Overall balance assessment: Needs assistance Sitting-balance support: No upper extremity supported;Feet supported Sitting balance-Leahy Scale: Fair     Standing balance support: Bilateral upper extremity supported;During functional activity;Reliant on assistive device for balance Standing balance-Leahy Scale: Poor Standing balance comment: able to stand at sink for grooming tasks but did fatigue to require seated break                           ADL either performed or assessed with clinical judgement   ADL Overall ADL's : Needs assistance/impaired     Grooming: Wash/dry hands;Wash/dry face;Oral care;Brushing hair;Sitting;Standing;Min guard Grooming Details (indicate cue type and reason): patient performed some grooming tasks seated due to fatigue         Upper Body Dressing : Set up;Sitting Upper Body Dressing Details (indicate cue type and reason): patient donned T-shirt and gown     Toilet Transfer: Min guard;Ambulation;Rolling walker (2 wheels);Cueing for safety Toilet Transfer Details (indicate cue type and reason): patient required cues for hand placement and attention to right         Functional mobility during ADLs: Min guard;Rolling walker (2 wheels) General ADL Comments: requires verbal cues to push up to stand, reach back to sit, and attention to right to locate items  and avoid obsticals    Extremity/Trunk Assessment Upper Extremity Assessment RUE Coordination: decreased fine motor            Vision       Perception     Praxis      Cognition Arousal/Alertness: Awake/alert Behavior During Therapy: WFL  for tasks assessed/performed Overall Cognitive Status: No family/caregiver present to determine baseline cognitive functioning Area of Impairment: Attention;Memory;Safety/judgement;Awareness;Problem solving                 Orientation Level: Disoriented to;Time;Situation Current Attention Level: Focused Memory: Decreased short-term memory Following Commands: Follows one step commands with increased time;Follows one step commands consistently Safety/Judgement: Decreased awareness of safety;Decreased awareness of deficits Awareness: Intellectual Problem Solving: Slow processing;Decreased initiation;Requires verbal cues;Requires tactile cues;Difficulty sequencing General Comments: frequent cues for safety and attention to right          Exercises     Shoulder Instructions       General Comments      Pertinent Vitals/ Pain       Pain Assessment: Faces Faces Pain Scale: Hurts a little bit Pain Location: lower back Pain Descriptors / Indicators: Discomfort Pain Intervention(s): Monitored during session  Home Living                                          Prior Functioning/Environment              Frequency  Min 2X/week        Progress Toward Goals  OT Goals(current goals can now be found in the care plan section)  Progress towards OT goals: Progressing toward goals  Acute Rehab OT Goals Patient Stated Goal: go to rehab OT Goal Formulation: With patient Time For Goal Achievement: 04/18/21 Potential to Achieve Goals: Good ADL Goals Pt Will Perform Grooming: with min assist;sitting Pt Will Perform Upper Body Bathing: with min assist;sitting Pt Will Perform Lower Body Bathing: sit to/from stand;with modified independence Pt Will Perform Lower Body Dressing: with modified independence;sit to/from stand Pt Will Transfer to Toilet: with modified independence;ambulating Additional ADL Goal #1: Maintain midline postural control EOB x 10 min  with minguard A Additional ADL Goal #2: Pt will ambulate around room during ADL tasks using compensatory strategies with min VC to avoid running into obstacles and reduce risk of falls  Plan Discharge plan remains appropriate    Co-evaluation                 AM-PAC OT "6 Clicks" Daily Activity     Outcome Measure   Help from another person eating meals?: A Little Help from another person taking care of personal grooming?: A Little Help from another person toileting, which includes using toliet, bedpan, or urinal?: A Lot Help from another person bathing (including washing, rinsing, drying)?: A Lot Help from another person to put on and taking off regular upper body clothing?: A Little Help from another person to put on and taking off regular lower body clothing?: A Lot 6 Click Score: 15    End of Session Equipment Utilized During Treatment: Gait belt;Rolling walker (2 wheels)  OT Visit Diagnosis: Unsteadiness on feet (R26.81);Other abnormalities of gait and mobility (R26.89);Muscle weakness (generalized) (M62.81);Other symptoms and signs involving the nervous system (R29.898);Other symptoms and signs involving cognitive function;Low vision, both eyes (H54.2);History of falling (Z91.81)   Activity Tolerance Patient tolerated treatment well  Patient Left in chair;with call bell/phone within reach;with chair alarm set   Nurse Communication Mobility status        Time: 2182-8833 OT Time Calculation (min): 29 min  Charges: OT General Charges $OT Visit: 1 Visit OT Treatments $Self Care/Home Management : 23-37 mins  Lodema Hong, [place]  Pager [phone] [employer] 04/15/2021, 11:08 AM

## 2021-04-15 NOTE — Discharge Summary (Signed)
[facility]  Physician Discharge Summary   Patient ID: [name] [name] MRN: [id number] DOB/AGE: [id number] 64 y.o.  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 Mickeal Skinner with neuro-oncology will schedule follow-up for this patient with this office. Patient to follow-up with Dr. Julien Nordmann for his lung cancer. Follow-up with Dr. Gloriann Loan with urology if there are further issues with hematuria or urinary retention Needs follow-up with neurology in 6-8 weeks for seizure. Please check CBC and basic metabolic panel in 1 week Needs echocardiogram in 1 month to follow-up on pericardial effusion   Home Health: Going to SNF Equipment/Devices: None   CODE STATUS: Full code   DISCHARGE CONDITION: fair   Diet recommendation: Dysphagia 3 diet with thin liquids   INITIAL HISTORY: [name] [name] is a 65 y.o. 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 In the ER, was post-ictal and neurology noted focal seizure activity. Then vomited, developed respiratory failure and was intubated.   11/14: Admitted to medicine initially for status epilepticus, then vomited, had respiratory failure, intubated and transferred to ICU 11/15: cEEG started, gross hematuria noted 11/16: extubated 11/20: transferred out of unit to [facility] 11/21: continuing to slowly improve; discussed with Dr. Mickeal Skinner, Neuro-oncology, and Dr. 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  Currently on higher dose of Keppra.  Has been seizure-free.  Follow-up with neurology in 6 to 8 weeks in the outpatient setting.   Aspiration pneumonia of right lung/acute respiratory failure with hypoxia The patient's respiratory status is stable.  He has completed course of antibiotics.  He is currently saturating normal on room air.   Non-small cell lung cancer with metastases Initially diagnosed in 2021.  Underwent craniectomy by neurosurgery for brain metastases in April 2022.  Has been getting radiation treatment up until recently.  Followed by Dr. Julien Nordmann.  Brain metastases Underwent radiation minaprine till recently.  Has had cognitive impairment as well as right lower extremity weakness as result of these lesions.   Remains on dexamethasone.  Plan is to eventually taper it down to twice a day.  Dr. Mickeal Skinner to arrange outpatient follow-up in a week or so.     Acute metabolic encephalopathy This was in the setting of her brain metastases as well as seizures.  Could have been postictal.  He is noted to have some cognitive impairment.  Hopefully will get better with treatment for cancer.   Pericardial effusion Noted incidentally.  Needs follow-up echocardiogram in 1 month.  Gross hematuria Had gross hematuria in the ICU.  Urology was consulted.  Foley catheter was placed.  They did bladder irrigation.  Foley catheter was removed subsequently.  Voiding trial was successful on 11/21.   Continue Flomax and bethanechol with plans to stop these at discharge. Outpatient follow-up with Dr. Gloriann Loan with urology as needed.   Essential hypertension Blood pressure is reasonably well controlled off of antihypertensives.     Normocytic anemia  Hemoglobin is stable.  COPD Stable.  Moderate  malnutrition Nutrition Problem: Moderate Malnutrition Etiology: chronic illness, cancer and cancer related treatments   Signs/Symptoms: moderate fat depletion, moderate muscle depletion, percent weight loss Percent weight loss: 8.4 %   Interventions: Tube feeding, Prostat    Patient is stable.  Has been stable for the last several days.  Okay for discharge to SNF.   PERTINENT LABS:  The results of significant diagnostics from this hospitalization (including imaging, microbiology, ancillary and laboratory) are listed below for reference.     Labs:  COVID-19 Labs   Lab Results  Component Value Date   SARSCOV2NAA NEGATIVE 04/01/2021   [place] NEGATIVE 08/23/2020   [place] NEGATIVE 12/25/2019   [place] Not Detected 06/04/2019      Basic Metabolic Panel: Recent Labs  Lab 04/14/21 0155  NA 134*  K 3.9  CL 98  CO2 27  GLUCOSE 115*  BUN 18  CREATININE 0.74  CALCIUM 9.2    CBC: Recent Labs  Lab 04/14/21 0155  WBC 10.0  HGB 10.2*  HCT 31.3*  MCV 80.3  PLT 325    BNP: BNP (last 3 results) Recent Labs    04/01/21 1911  BNP 25.6     CBG: Recent Labs  Lab 04/14/21 1205 04/14/21 1601  GLUCAP 96 109*     IMAGING STUDIES CT ABDOMEN PELVIS WO CONTRAST  Result Date: 04/02/2021 CLINICAL DATA:  65 year old male with altered mental status and questionable pneumoperitoneum on chest x-ray. Metastatic non-small cell lung cancer. EXAM: CT ABDOMEN AND PELVIS WITHOUT CONTRAST TECHNIQUE: Multidetector CT imaging of the abdomen and pelvis was performed following the standard protocol without IV contrast. COMPARISON:  Portable chest 0223 hours today. Restaging CT Chest, Abdomen, and Pelvis 12/07/2020. Recent CTA head and neck 1949 hours yesterday. FINDINGS: Lower chest: Trace pleural effusions are new since July. The known left lower lobe lung mass is not included. But there is new patchy, inflammatory opacity in the lateral basal segment of the right  lower lobe (series 5, image 7). Small volume pericardial effusion is increased since July and might have intermediate density on series 3, image 4, but significance is unclear. Hepatobiliary: Stable noncontrast liver. Vicarious contrast excretion to the gallbladder which otherwise appears negative. Pancreas: Negative noncontrast pancreas. Spleen: Diminutive, negative. Adrenals/Urinary Tract: Adrenal glands are within normal limits. But there is abnormal pararenal space stranding and/or fluid, and asymmetric delayed nephrograms with residual renal parenchymal contrast and contrast excretion to the collecting systems. Bilateral retroperitoneal/periureteral stranding, although no hydroureter. Contrast is intermittently present in both ureters to the bladder. The bladder is diminutive but thick-walled, and there is intermediate density filling defect within the bladder on series 7, image 50. But no perivesical stranding. No urinary calculus identified. Stomach/Bowel: Mild retained stool in the rectum but decompressed sigmoid and descending colon. Mild retained gas in the transverse colon. Decompressed right colon. No dilated small bowel. Enteric tube courses to the gastric body. Stomach and duodenum are decompressed. No free air, free fluid, or bowel mesenteric inflammation identified. Vascular/Lymphatic: Aortoiliac calcified atherosclerosis. Vascular patency is not evaluated in the absence of IV contrast. No lymphadenopathy. Reproductive: Urethral catheter in place. Other: No pelvic free fluid. Musculoskeletal: Chronic L5 pars fractures and spondylolisthesis. Chronic disc and endplate degeneration there. Stable visualized osseous structures. IMPRESSION: 1. Negative for pneumoperitoneum or bowel abnormality. 2. But positive for asymmetric delayed nephrograms (worse on the left) with abundant abnormal pararenal space stranding and/or fluid. But no hydronephrosis or obstructing lesion. Bladder decompressed by Foley  catheter, although with intermediate density filling defect in the bladder lumen. Constellation suggests Acute Intrinsic Renal Failure. Query hematuria. Urinalysis recommended. 3. Positive also for patchy opacity in the lateral basal segment of the right lower lobe suspicious for Acute Bronchopneumonia. And trace pleural effusions are new since July. 4. And also new small volume pericardial effusion with possible complex fluid density. Query Pericarditis. 5. Enteric tube terminates in the gastric body. Electronically Signed   By: Genevie Ann M.D.   On: 04/02/2021 04:49   MR BRAIN W WO CONTRAST  Result Date: 04/02/2021 CLINICAL DATA:  Initial evaluation for neuro deficit, stroke suspected. History of lung cancer, status post resection. EXAM: MRI HEAD WITHOUT AND WITH CONTRAST TECHNIQUE: Multiplanar, multiecho pulse sequences of the brain and surrounding structures were obtained without and with intravenous contrast. CONTRAST:  5.33mL GADAVIST GADOBUTROL 1 MMOL/ML IV SOLN COMPARISON:  Prior CTs from earlier the same day as well as prior CT from 03/13/2021. FINDINGS: Brain: Examination severely degraded by motion artifact. Postoperative changes from prior left parietal craniotomy for tumor resection again seen. Again seen is an enhancing mass involving the parasagittal left parietal lobe measuring 1.4 x 1.2 cm, consistent with metastasis (series 12, image 20). Surrounding vasogenic edema seen within the adjacent left parieto-occipital region. Diffuse dural thickening and enhancement about the adjacent craniotomy site favored to be postoperative in nature (series 12, image 24). Subtle increased diffusion abnormality within this region noted as well, which could reflect subtle changes of superimposed seizure (series 3, image 39, 31). There is question of subtly increased diffusion signal abnormality within the medial left thalamus (series 3, image 27), not entirely certain on this motion degraded exam. No definite ADC  correlate or abnormal enhancement. No other evidence for acute or subacute infarct elsewhere within the brain. No other acute or chronic intracranial hemorrhage. No other visible mass lesion or abnormal enhancement. No midline shift. No hydrocephalus or extra-axial fluid collection. 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 Vascular: Major intracranial vascular flow voids are grossly maintained at the skull base, better evaluated on prior CTA. Skull and upper cervical spine: Craniocervical junction within normal limits. Bone marrow signal intensity normal. No visible focal marrow replacing lesion. Prior left parietal craniotomy. No scalp soft tissue abnormality. Sinuses/Orbits: Globes and orbital soft tissues grossly within normal limits on this motion degraded exam. Scattered mucosal thickening noted about the right maxillary sinus. Paranasal sinuses are otherwise largely clear. No mastoid effusion. Other: None. IMPRESSION: 1. Technically limited exam due to extensive motion artifact. 2. 1.4 cm metastasis involving the parasagittal left parietal lobe. Surrounding vasogenic edema without midline shift. 3. Diffuse dural thickening and enhancement about the adjacent craniotomy site, favored to be postoperative in nature, although attention at follow-up is recommended. 4. Question subtly increased diffusion signal abnormality at the left thalamus. Although this finding is not entirely certain given motion artifact on this exam, possible subtle changes of ischemia or possibly seizure could be considered. Additional mildly increased diffusion signal within the adjacent left parieto-occipital region could also reflect changes of acute seizure. Correlation with EEG recommended. 5. No other acute intracranial abnormality. Electronically Signed   By: Jeannine Boga M.D.   On: 04/02/2021 03:25   DG CHEST PORT 1 VIEW  Result Date: 04/03/2021 CLINICAL DATA:  Intubation. EXAM: PORTABLE CHEST 1  VIEW COMPARISON:  April 02, 2021. FINDINGS: The heart size and mediastinal contours are within normal limits. Endotracheal and nasogastric tubes have been removed. Right internal jugular Port-A-Cath is unchanged in position.  Mild biapical scarring is noted. Stable rounded density is seen in left lower lobe. The visualized skeletal structures are unremarkable. IMPRESSION: Endotracheal and nasogastric tubes have been removed. Stable nodular density is seen in left lower lobe. Electronically Signed   By: Marijo Conception M.D.   On: 04/03/2021 16:21   DG Chest Portable 1 View  Result Date: 04/02/2021 CLINICAL DATA:  History of lung carcinoma EXAM: PORTABLE CHEST 1 VIEW COMPARISON:  04/01/2021 CT from  02/08/2021 FINDINGS: Cardiac shadow is stable. Gastric catheter is noted within the stomach although the proximal side port lies in the distal esophagus. This should be advanced several cm deeper into the stomach. Endotracheal tube is noted in satisfactory position. Right chest wall port is noted in satisfactory position. Rounded left lower lobe mass lesion is again identified stable from prior CT. No sizable effusion is noted. No focal infiltrate is seen. Bony structures are within normal limits. IMPRESSION: Tubes and lines as described above. Gastric catheter should be advanced deeper into the stomach. Left lower lobe mass lesion stable from the prior CT. Electronically Signed   By: Inez Catalina M.D.   On: 04/02/2021 02:31   DG Chest Port 1 View  Result Date: 04/01/2021 CLINICAL DATA:  Questionable sepsis. EXAM: PORTABLE CHEST 1 VIEW COMPARISON:  Chest x-ray 08/30/2020.  CT of the chest 02/08/2021. FINDINGS: Right chest port catheter tip projects over the distal SVC. Chronic appearing scarring in the right apex and paramediastinal region appears unchanged. Bullae are again noted in the left lung apex. Nodular densities in the left lower lung persists, grossly unchanged. Artifact overlies is region. There is  no new lung infiltrate, pleural effusion or pneumothorax identified. Cardiac silhouette is within normal limits. No acute fractures are seen. Air under the right hemidiaphragm is indeterminate. IMPRESSION: 1. Air under the right hemidiaphragm is indeterminate and may be within air distended bowel. If there is high clinical concern for free air recommend dedicated abdominal series with decubitus view. 2. Stable nodular densities in the left lower lung. No new lung infiltrate. Electronically Signed   By: Ronney Asters M.D.   On: 04/01/2021 21:28   DG Abd Portable 1V  Result Date: 04/03/2021 CLINICAL DATA:  Encounter for orogastric tube placement. History of metastatic lung cancer. EXAM: PORTABLE ABDOMEN - 1 VIEW COMPARISON:  04/02/2021 and chest CT 02/08/2021 FINDINGS: Endotracheal tube is approximately 1.2 cm above the carina. The OG tube is in the left upper abdomen near the gastric fundus. Again noted is a right jugular Port-A-Cath with the tip in the lower SVC region. Heart size is within normal limits and stable. Again noted is a large nodule in the left lower lobe. Few patchy densities in the right lower lung are unchanged from the recent comparison examination. Negative for pneumothorax. IMPRESSION: 1. OG tube tip in the gastric fundus region. 2.  Endotracheal tube is appropriately positioned. 3. No significant change in the lung densities. Electronically Signed   By: Markus Daft M.D.   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 EEG to evaluate for seizure Level of alertness:  lethargic AEDs during EEG study: LEV Technical aspects: This  EEG  study was done with scalp electrodes positioned according to the 10-20 International system of electrode placement. Electrical activity was acquired at a sampling rate of $Remov'500Hz'oaMNbA$  and reviewed with a high frequency filter of $RemoveB'70Hz'ghhnDPoS$  and a low frequency filter of $RemoveB'1Hz'mhJNjtmN$ . EEG data were recorded continuously and digitally stored. Description: EEG showed continuous generalized and maximal left posterior temporal region 3 to 6 Hz theta-delta slowing with overriding 15 to 18 Hz generalized beta activity. Lateralized paretic discharges with overriding fast activity were also noted in left posterior temporal region with fluctuating frequency between 0.5 to 1 Hz which at times appeared rhythmic without definite evolution consistent with brief ictal-interictal rhythmic discharges.  Hyperventilation and photic stimulation were not performed.   ABNORMALITY -Periodic discharges with overriding fast activity, left hemisphere, maximal left posterior temporal region ( LPD+) -Brief ictal-interictal rhythmic discharges, left hemisphere, maximal left posterior temporal region -Continuous slow, generalized and maximal left posterior temporal region IMPRESSION: This study showed evidence of epileptogenicity and cortical dysfunction arising from left hemisphere, maximal left posterior temporal region which is on the ictal-interictal continuum with high potential for seizure recurrence.  Additionally there is moderate diffuse encephalopathy, nonspecific etiology.  No definite seizures were seen. Lora Havens   ECHOCARDIOGRAM COMPLETE  Result Date: 04/02/2021    ECHOCARDIOGRAM REPORT   Patient Name:   AERIC BURNHAM Date of Exam: 04/02/2021 Medical Rec #:  [id number]       Height:       60.0 in Accession #:    [id number]      Weight:       122.6 lb Date of Birth:  [birth year]      BSA:          1.516 m Patient Age:    74 years        BP:           107/76 mmHg Patient Gender: M               HR:           117 bpm. Exam Location:   Inpatient Procedure: 2D Echo, Cardiac Doppler and Color Doppler Indications:    Pericardial Effusion  History:        Patient has no prior history of Echocardiogram examinations.                 Risk Factors:Hypertension.  Sonographer:    Helmut Muster Referring Phys: [id number] PAULA B SIMPSON  Sonographer Comments: Image acquisition challenging due to patient behavioral factors. and Image acquisition challenging due to patient body habitus. IMPRESSIONS  1. Moderate pericardial effusion that is apical and anterior to the RV. Largest at 17 mm. There is no RV or RA collapse. IVC not well visualized. There is no mitral or tricuspid respirophasic variation in spectral Doppler signal- a marker for increased pericardial pressure.  2. Left ventricular ejection fraction, by estimation, is 60%. The left ventricle has normal function. The left ventricle has no regional wall motion abnormalities. Indeterminate diastolic filling due to E-A fusion.  3. Right ventricular systolic function is normal. The right ventricular size is normal. Tricuspid regurgitation signal is inadequate for assessing PA pressure.  4. The mitral valve is normal in structure. No evidence of mitral valve regurgitation. No evidence of mitral stenosis.  5. The aortic valve is tricuspid. There is mild thickening of the aortic valve. Aortic valve regurgitation is not visualized. Comparison(s): No prior Echocardiogram. FINDINGS  Left Ventricle: Left ventricular ejection fraction, by estimation, is 60%. The left  ventricle has normal function. The left ventricle has no regional wall motion abnormalities. The left ventricular internal cavity size was normal in size. There is no left ventricular hypertrophy. Indeterminate diastolic filling due to E-A fusion. Right Ventricle: The right ventricular size is normal. No increase in right ventricular wall thickness. Right ventricular systolic function is normal. Tricuspid regurgitation signal is inadequate for assessing  PA pressure. Left Atrium: Left atrial size was normal in size. Right Atrium: Right atrial size was normal in size. Pericardium: A moderately sized pericardial effusion is present. The pericardial effusion is surrounding the apex. Mitral Valve: The mitral valve is normal in structure. No evidence of mitral valve regurgitation. No evidence of mitral valve stenosis. Tricuspid Valve: The tricuspid valve is normal in structure. Tricuspid valve regurgitation is not demonstrated. No evidence of tricuspid stenosis. Aortic Valve: The aortic valve is tricuspid. There is mild thickening of the aortic valve. Aortic valve regurgitation is not visualized. Pulmonic Valve: The pulmonic valve was not well visualized. Pulmonic valve regurgitation is not visualized. No evidence of pulmonic stenosis. Aorta: The aortic root is normal in size and structure. IAS/Shunts: The atrial septum is grossly normal.  LEFT VENTRICLE PLAX 2D LVIDd:         4.50 cm     Diastology LVIDs:         3.30 cm     LV e' medial:   4.70 cm/s LV PW:         0.80 cm     LV E/e' medial: 15.1 LV IVS:        0.80 cm LVOT diam:     2.20 cm LV SV:         71 LV SV Index:   47 LVOT Area:     3.80 cm  LV Volumes (MOD) LV vol d, MOD A2C: 51.0 ml LV vol d, MOD A4C: 53.5 ml LV vol s, MOD A2C: 19.8 ml LV vol s, MOD A4C: 19.3 ml LV SV MOD A2C:     31.2 ml LV SV MOD A4C:     53.5 ml LV SV MOD BP:      34.3 ml RIGHT VENTRICLE RV S prime:     17.40 cm/s TAPSE (M-mode): 2.6 cm LEFT ATRIUM             Index       RIGHT ATRIUM          Index LA diam:        2.70 cm 1.78 cm/m  RA Area:     7.97 cm LA Vol (A2C):   13.2 ml 8.71 ml/m  RA Volume:   12.90 ml 8.51 ml/m LA Vol (A4C):   14.0 ml 9.23 ml/m LA Biplane Vol: 14.3 ml 9.43 ml/m  AORTIC VALVE LVOT Vmax:   123.00 cm/s LVOT Vmean:  69.000 cm/s LVOT VTI:    0.186 m  AORTA Ao Root diam: 3.30 cm Ao Asc diam:  3.10 cm MITRAL VALVE MV Area (PHT): 8.92 cm     SHUNTS MV Decel Time: 85 msec      Systemic VTI:  0.19 m MV E velocity:  71.10 cm/s   Systemic Diam: 2.20 cm MV A velocity: 117.00 cm/s MV E/A ratio:  0.61 Rudean Haskell MD Electronically signed by Rudean Haskell MD Signature Date/Time: 04/02/2021/4:15:24 PM    Final    CT HEAD CODE STROKE WO CONTRAST  Result Date: 04/01/2021 CLINICAL DATA:  Code stroke. EXAM: CT HEAD WITHOUT CONTRAST TECHNIQUE: Contiguous axial images  were obtained from the base of the skull through the vertex without intravenous contrast. COMPARISON:  03/13/2021. FINDINGS: Brain: Large area of hypodensity involving the left parietal and occipital lobe; this area previously contained an enhancing lesion and was the subject of recent SRS. No acute hemorrhage, mass effect, or midline shift. No hydrocephalus or extra-axial collection. Vascular: No hyperdense vessel or unexpected calcification. Skull: Status post right frontotemporal and left parietal craniotomies. No acute osseous abnormality. Sinuses/Orbits: Mucosal thickening in the right maxillary sinus. The orbits are unremarkable. Other: Fluid in the right mastoid air cells. ASPECTS [facility] Stroke Program Early CT Score) - Ganglionic level infarction (caudate, lentiform nuclei, internal capsule, insula, M1-M3 cortex): 7 - Supraganglionic infarction (M4-M6 cortex): 3 Total score (0-10 with 10 being normal): 10 IMPRESSION: 1. Hypodensity in the left parietal and occipital lobes, which is new from the prior exam but which correlates with an area of recent radiation therapy. This may represent acute infarct or edema related to recent SRS. 2. ASPECTS is 10 Code stroke imaging results were communicated on 04/01/2021 at 7:46 pm to provider Dr. Cheral Marker via secure text paging. Electronically Signed   By: Merilyn Baba M.D.   On: 04/01/2021 19:48   CT ANGIO HEAD NECK W WO CM W PERF (CODE STROKE)  Result Date: 04/01/2021 CLINICAL DATA:  Initial evaluation for neuro deficit, stroke suspected, aphasia. EXAM: CT ANGIOGRAPHY HEAD AND NECK CT PERFUSION BRAIN  TECHNIQUE: Multidetector CT imaging of the head and neck was performed using the standard protocol during bolus administration of intravenous contrast. Multiplanar CT image reconstructions and MIPs were obtained to evaluate the vascular anatomy. Carotid stenosis measurements (when applicable) are obtained utilizing NASCET criteria, using the distal internal carotid diameter as the denominator. Multiphase CT imaging of the brain was performed following IV bolus contrast injection. Subsequent parametric perfusion maps were calculated using RAPID software. CONTRAST:  53mL OMNIPAQUE IOHEXOL 350 MG/ML SOLN COMPARISON:  Prior CT from earlier the same day. FINDINGS: CTA NECK FINDINGS Aortic arch: Visualized aortic arch normal caliber with normal 3 vessel morphology. No stenosis about the origin of the great vessels. Right carotid system: Right common and internal carotid arteries widely patent without stenosis, dissection or occlusion. Left carotid system: Left common and internal carotid arteries widely patent without stenosis, dissection or occlusion. Vertebral arteries: Both vertebral arteries arise from the subclavian arteries. No proximal subclavian artery stenosis. Both vertebral arteries widely patent without stenosis, dissection or occlusion. Skeleton: No discrete or worrisome osseous lesions. C3 and C4 vertebral bodies are fused. Other neck: Asymmetric thickening and enhancement seen about the right masseter muscle (series 5, image 88), nonspecific, but could reflect post radiation changes and/or myositis, which could be either infectious or inflammatory in nature. Adjacent right parotid gland and submandibular glands within normal limits. No other acute abnormality within the neck. Upper chest: Abnormal pleuroparenchymal thickening at the medial right upper lobe/right lung apex, likely reflecting post treatment changes. Severe bolus emphysematous changes within the visualized left lung. 12 mm left upper lobe  nodule partially visualized (series 5, image 169). Right-sided Port-A-Cath noted. Review of the MIP images confirms the above findings CTA HEAD FINDINGS Anterior circulation: Both internal carotid arteries widely patent to the termini without stenosis. 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 No residual aneurysm visualized. A1 segments patent bilaterally. Left A1 hypoplastic. Normal anterior communicating artery complex. Anterior cerebral arteries patent to their distal aspects without stenosis. No M1 stenosis or occlusion. Normal MCA bifurcations. Distal MCA branches  perfused and symmetric. Posterior circulation: Both V4 segments patent to the vertebrobasilar junction without stenosis. Right PICA patent. Left PICA not well seen. Basilar patent to its distal aspect without stenosis. Superior cerebral arteries patent bilaterally. Both PCAs primarily supplied via the basilar well perfused or distal aspects. Venous sinuses: Grossly patent a large for timing the contrast bolus. Anatomic variants: None significant. Asymmetric left a meningeal enhancement at the left parietal region related to vasogenic edema and known metastasis at this location. Review of the MIP images confirms the above findings CT Brain Perfusion Findings: ASPECTS: 10. CBF (<30%) Volume: 72mL Perfusion (Tmax>6.0s) volume: 73mL Mismatch Volume: 65mL Infarction Location:Negative CT perfusion with no evidence for acute ischemia. Increased perfusion is seen within the left parietal region on dedicated perfusion maps, presumably related to vasogenic edema and known metastatic lesion at this location. Changes of acute seizure may be contributory. IMPRESSION: 1. Negative CTA for emergent large vessel occlusion. 2. Negative CT perfusion with no evidence for acute ischemia. 3. Mild for age atheromatous disease. No hemodynamically significant or correctable stenosis about the major arterial vasculature of the head and neck.  4. Increased perfusion with leptomeningeal enhancement about the left parietal region, presumably related to vasogenic edema and known metastasis at this location. Superimposed changes of acute seizure could also be considered. 5. 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 No residual aneurysm visualized. 6. Presumed post treatment changes at the right lung apex, with persistent 12 mm left upper lobe nodule. 7. Asymmetric thickening and enhancement about the right masseter muscle, nonspecific, but suspected to reflect post radiation changes. Acute myositis, which could be either infectious or inflammatory, would be the primary differential consideration. 8. Emphysema (ICD10-J43.9). Results discussed by telephone at the time of interpretation on 04/01/2021 at approximately 8:15 p.m. to provider ERIC [facility] , who verbally acknowledged these results. Electronically Signed   By: Jeannine Boga M.D.   On: 04/01/2021 21:07   Rapid EEG  Result Date: 04/02/2021 Lora Havens, MD     04/02/2021 11:49 AM Patient Name: Halvor Behrend MRN: [id number] Epilepsy Attending: Lora Havens Referring Physician/Provider: Dr. 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  Rapid EEG to evaluate for seizure. Level of alertness: awake AEDs during EEG study: LEV Technical aspects: This EEG study was done with scalp electrodes positioned according to the 10-20 International system of electrode placement. Electrical activity was acquired at a sampling rate of 500Hz  and reviewed with a high frequency filter of 70Hz  and a low frequency filter of 1Hz . EEG data were recorded continuously and digitally stored. Description: EEG showed continuous generalized 2 to 3 Hz delta slowing with overriding 15-18hz  generalized beta activity. Hyperventilation and photic stimulation were not  performed.   Of note, study was technically difficult due to electrode artifact. ABNORMALITY - Continuous slow, generalized - Excessive beta, generalized IMPRESSION: This limited ceribell EEG is suggestive of severe diffuse encephalopathy, nonspecific etiology but could be secondary to medications like benzodiazepines.  No seizures or epileptiform discharges were seen throughout the recording. If suspicion for ictal-interictal activity persists, please consider conventional EEG. Priyanka O Yadav    DISCHARGE EXAMINATION: Vitals:   04/15/21 0009 04/15/21 0300 04/15/21 0500 04/15/21 0830  BP: 107/66 108/78  118/78  Pulse: 80 83  98  Resp: 18 18  18   Temp: 98.3 F (36.8 C) 97.7 F (36.5 C)  98.1  F (36.7 C)  TempSrc: Oral Oral  Oral  SpO2: 98% 100%  100%  Weight:   59.9 kg   Height:       Awake alert.  No distress Lungs are clear to auscultation bilaterally S1-S2 is normal regular. Abdomen is soft and nontender nondistended.  DISPOSITION: SNF  Discharge Instructions     Amb Referral to Palliative Care   Complete by: As directed    Ambulatory referral to Neurology   Complete by: As directed    An appointment is requested in approximately: 8 weeks for seizure in setting of brain met   Call MD for:  difficulty breathing, headache or visual disturbances   Complete by: As directed    Call MD for:  extreme fatigue   Complete by: As directed    Call MD for:  persistant dizziness or light-headedness   Complete by: As directed    Call MD for:  persistant nausea and vomiting   Complete by: As directed    Call MD for:  severe uncontrolled pain   Complete by: As directed    Call MD for:  temperature >100.4   Complete by: As directed    Discharge instructions   Complete by: As directed    Please review instructions on the discharge summary.  You were cared for by a hospitalist during your hospital stay. If you have any questions about your discharge medications or the care you received  while you were in the hospital after you are discharged, you can call the unit and asked to speak with the hospitalist on call if the hospitalist that took care of you is not available. Once you are discharged, your primary care physician will handle any further medical issues. Please note that NO REFILLS for any discharge medications will be authorized once you are discharged, as it is imperative that you return to your primary care physician (or establish a relationship with a primary care physician if you do not have one) for your aftercare needs so that they can reassess your need for medications and monitor your lab values. If you do not have a primary care physician, you can call [phone] for a physician referral.   Increase activity slowly   Complete by: As directed          Allergies as of 04/15/2021       Reactions   Other Itching, Other (See Comments)   Seasonal allergies- Itchy eyes, runny nose, sneezing, scratchy throat        Medication List     TAKE these medications    acetaminophen 500 MG tablet Commonly known as: TYLENOL Take 500 mg by mouth every 6 (six) hours as needed for mild pain or moderate pain.   budesonide-formoterol 80-4.5 MCG/ACT inhaler Commonly known as: SYMBICORT Inhale 2 puffs into the lungs 2 (two) times daily. What changed:  when to take this reasons to take this   clonazePAM 0.25 MG disintegrating tablet Commonly known as: KLONOPIN Take 1 tablet (0.25 mg total) by mouth 2 (two) times daily.   dexamethasone 4 MG tablet Commonly known as: DECADRON Take $RemoveBefor'4mg'BPTCzCXtvFkS$  three times daily for 5 days, then $RemoveBe'4mg'UFLKHZRDe$  twice daily.   feeding supplement Liqd Take 237 mLs by mouth 2 (two) times daily between meals.   folic acid 1 MG tablet Commonly known as: FOLVITE Take 1 tablet (1 mg total) by mouth daily.   ipratropium-albuterol 0.5-2.5 (3) MG/3ML Soln Commonly known as: DUONEB Take 3 mLs by nebulization every 4 (four)  hours as needed.   levETIRAcetam  1000 MG tablet Commonly known as: KEPPRA Take 1 tablet (1,000 mg total) by mouth 2 (two) times daily. What changed:  medication strength how much to take   lidocaine-prilocaine cream Commonly known as: EMLA Apply 1 application topically as needed.   multivitamin tablet Take 1 tablet by mouth daily.   pantoprazole 40 MG tablet Commonly known as: PROTONIX Take 1 tablet (40 mg total) by mouth daily with supper.   polyethylene glycol 17 g packet Commonly known as: MIRALAX / GLYCOLAX Take 17 g by mouth daily.   prochlorperazine 10 MG tablet Commonly known as: COMPAZINE Take 1 tablet (10 mg total) by mouth every 6 (six) hours as needed for nausea or vomiting.   senna 8.6 MG Tabs tablet Commonly known as: SENOKOT Take 1 tablet (8.6 mg total) by mouth 2 (two) times daily.   Spiriva HandiHaler 18 MCG inhalation capsule Generic drug: tiotropium Place 1 capsule into inhaler and inhale daily.   Ventolin HFA 108 (90 Base) MCG/ACT inhaler Generic drug: albuterol Inhale 2 puffs into the lungs every 4 (four) hours as needed for wheezing or shortness of breath.          Follow-up Information     Vaslow, Acey Lav, MD Follow up.   Specialties: Psychiatry, Neurology, Oncology Why: office will schedule appointment Contact information: [place] [address] [phone]         Curt Bears, MD Follow up.   Specialty: Oncology Contact information: [place] [address] [phone]                 TOTAL DISCHARGE TIME: 70 minutes  [place]  [facility] Pager on [email or web address]  04/15/2021, 10:07 AM

## 2021-04-15 NOTE — Progress Notes (Signed)
Pt discharged via [facility] to [facility] at this time.  He has all belongings with him including cell phone and charger.

## 2021-04-18 ENCOUNTER — Encounter: Payer: Self-pay | Admitting: Physician Assistant

## 2021-04-18 ENCOUNTER — Encounter: Payer: Self-pay | Admitting: Internal Medicine

## 2021-04-22 ENCOUNTER — Inpatient Hospital Stay: Payer: Medicaid Other | Attending: Internal Medicine

## 2021-04-22 DIAGNOSIS — C7931 Secondary malignant neoplasm of brain: Secondary | ICD-10-CM | POA: Insufficient documentation

## 2021-04-22 DIAGNOSIS — C3411 Malignant neoplasm of upper lobe, right bronchus or lung: Secondary | ICD-10-CM | POA: Insufficient documentation

## 2021-04-22 DIAGNOSIS — R569 Unspecified convulsions: Secondary | ICD-10-CM | POA: Insufficient documentation

## 2021-04-23 ENCOUNTER — Telehealth: Payer: Self-pay | Admitting: Internal Medicine

## 2021-04-23 NOTE — Telephone Encounter (Signed)
Scheduled per sch msg. Called and spoke with patients friend Clinical cytogeneticist. Confirmed appt

## 2021-04-24 ENCOUNTER — Encounter: Payer: Self-pay | Admitting: Urology

## 2021-04-24 NOTE — Progress Notes (Signed)
Patient states doing well. No symptoms reported at this time. Patient currently completing rehabilitation to strengthen legs and improve his walking. Meaningful use complete.  Patient notified of 9:00am-04/25/21 telephone ONLY appointment and verbalized understanding.  Patient preferred contact # [phone]

## 2021-04-25 ENCOUNTER — Inpatient Hospital Stay: Payer: Medicaid Other | Admitting: Nurse Practitioner

## 2021-04-25 ENCOUNTER — Ambulatory Visit
Admission: RE | Admit: 2021-04-25 | Discharge: 2021-04-25 | Disposition: A | Discharge status: Home / Self Care | Payer: Medicaid Other | Source: Ambulatory Visit | Attending: Urology | Admitting: Urology

## 2021-04-25 DIAGNOSIS — C7931 Secondary malignant neoplasm of brain: Secondary | ICD-10-CM | POA: Insufficient documentation

## 2021-04-25 NOTE — Progress Notes (Signed)
Radiation Oncology         [phone]) [phone] ________________________________  Name: [name] [name] MRN: [id number]  Date: 04/25/2021  DOB: 1955/10/17  Post Treatment Note  CC: [name] Perna, NP  [name] Coss, MD  Diagnosis:   65 y.o. 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   Interval Since Last Radiation:  1 month 03/26/21:  SRS//PTV2: Left parietal 1.1 cm target was treated to a prescription dose of 20 Gy in a single fraction.   08/30/20 Pre-Op SRS//PTV1:  The 3.2 cm target in the left parietal vertex was treated to 18 Gy in a single fraction, followed by surgical resection with [name] [name] on 08/31/20.  01/17/20 - 03/02/20: The patient was treated to the disease within the right lung initially to a dose of 60 Gy using a 3 field, IMRT technique. The patient then received a cone down boost treatment for an additional 6 Gy. This yielded a final total dose of 66 Gy.  Narrative:  In summary, he has been under the care of [name] [name] since he was diagnosed with stage IIIA non-small cell lung cancer (adenocarcinoma) in 12/2019. He was treated with concurrent chemoradiation, with daily IMRT under the care of [name] [name] and weekly carboplatin/paclitaxel (7 cycles). 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   He contacted [name] [name] office on 08/23/19 with concerns regarding new onset right upper and lower extremity weakness. He was referred to the ED at that time and underwent non-contrast head CT showing a new 3.1 cm mass lesion in left posterior parietal convexity with a large area of surrounding vasogenic edema and associated mass effect but no intracranial hemorrhage.  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 Due to the aneurysm clip, he was unable to undergo MRI. They proceeded with contrast head CT showing a 3.2 cm intra-axial mass lesion at the parietal vertex on the left with regional vasogenic edema. He met with [name] [name], neurosurgery, and the recommendation was to proceed with pre-op SRS followed by surgical resection of the mass which the patient was in agreement with.  Pre-op SRS was completed on 08/30/20 followed by surgical resection on 08/31/20. He tolerated the pre-op SRS well, without any acute ill side effects.  His systemic therapy was changed to carboplatin, Alimta and Keytruda with his first dose on 10/08/20. He had a posttreatment CT head on 11/29/2020 which showed substantial decrease in the left cerebral edema at the vertex with residual hypoattenuation underlying the craniotomy but no nodular enhancement at this location. There was no evidence of recurrent or new metastatic disease.  Starting from cycle #5 of his systemic therapy, he was switched to maintenance treatment with Alimta and Keytruda which he has continued to tolerate well.  Repeat systemic imaging with CT C/A/P for disease restaging on 02/08/21 showed overall disease stability with stable to slightly improved metastatic disease in the chest and without any evidence of metastatic disease in the abdomen or pelvis. However, on the post treatment follow up CT Head, performed 03/13/21, there was a new 1.1 cm enhancing lesion of the parasagittal left parietal lobe with mild adjacent edema. The surgical cavity of the high left parietal lobe has contracted and there is thin enhancement at  the margins as well as adjacent dural enhancement but no nodular enhancement to suggest recurrent disease at the surgical site.  His case and imaging were reviewed in the multidisciplinary brain conference on 03/18/21 with consensus recommendation to proceed with a single fraction of SRS to treat  the new lesion. This was discussed at his follow up visit 03/19/21 and he was in agreement to proceed. His treatment was completed 03/26/21 and he tolerated this well and was discharged home in stable condition.  Unfortunately, just a few days later on 04/01/2021 he presented to the emergency department with complaints of altered mental status and new onset aphasia.  He exhibited 2 spells of transient right lower extremity jerking while in CT which lasted for about 10 seconds, without any apparent change in his level of consciousness and no change to his aphasia.  During admission, his Keppra dosage was increased to 1000 mg p.o. twice daily and he remained seizure-free.  He was discharged to a skilled nursing facility for rehab where he currently resides and he reports that he is making some progress with physical therapy and regaining his strength.  He denies any further seizures since his discharge from the hospital and denies any headaches, visual changes, dizziness or tremor.  He has no longer having difficulty with his speech and overall, is pleased with his progress to date.         On review of systems, the patient states that he is doing well in general.  He denies any nausea, vomiting, dizziness, changes in visual or auditory acuity, tremors or seizure activity.   His appetite is variable but he continues to eat regularly to maintain his weight. He continues with an unsteady gait and weakness in the right lower extremity but is working with physical therapy at the rehab facility.  He had a Port-A-Cath placed on 11/30/20 for continued systemic therapy which he feels he is tolerating well. Otherwise, he is without complaints.   ALLERGIES:  is allergic to other.  Meds: Current Outpatient Medications  Medication Sig Dispense Refill   acetaminophen (TYLENOL) 500 MG tablet Take 500 mg by mouth every 6 (six) hours as needed for mild pain or moderate pain.     budesonide-formoterol (SYMBICORT) 80-4.5 MCG/ACT  inhaler Inhale 2 puffs into the lungs 2 (two) times daily. 1 each 11   clonazePAM (KLONOPIN) 0.25 MG disintegrating tablet Take 1 tablet (0.25 mg total) by mouth 2 (two) times daily. 30 tablet 0   dexamethasone (DECADRON) 4 MG tablet Take $RemoveBef'4mg'opSITMucvH$  three times daily for 5 days, then $RemoveBe'4mg'NMAgvuzFq$  twice daily.     feeding supplement (ENSURE ENLIVE / ENSURE PLUS) LIQD Take 237 mLs by mouth 2 (two) times daily between meals. 962 mL 12   folic acid (FOLVITE) 1 MG tablet Take 1 tablet (1 mg total) by mouth daily. 30 tablet 4   ipratropium-albuterol (DUONEB) 0.5-2.5 (3) MG/3ML SOLN Take 3 mLs by nebulization every 4 (four) hours as needed. 360 mL    levETIRAcetam (KEPPRA) 1000 MG tablet Take 1 tablet (1,000 mg total) by mouth 2 (two) times daily.     lidocaine-prilocaine (EMLA) cream Apply 1 application topically as needed. 30 g 1   Multiple Vitamin (MULTIVITAMIN) tablet Take 1 tablet by mouth daily.     pantoprazole (PROTONIX) 40 MG tablet Take 1 tablet (40 mg total) by mouth daily with supper. 30 tablet 2   polyethylene glycol (MIRALAX / GLYCOLAX) 17 g packet Take 17 g by mouth daily. 14 each 0  prochlorperazine (COMPAZINE) 10 MG tablet Take 1 tablet (10 mg total) by mouth every 6 (six) hours as needed for nausea or vomiting. 30 tablet 0   senna (SENOKOT) 8.6 MG TABS tablet Take 1 tablet (8.6 mg total) by mouth 2 (two) times daily. 120 tablet 0   tiotropium (SPIRIVA HANDIHALER) 18 MCG inhalation capsule Place 1 capsule into inhaler and inhale daily. 30 capsule 1   VENTOLIN HFA 108 (90 Base) MCG/ACT inhaler Inhale 2 puffs into the lungs every 4 (four) hours as needed for wheezing or shortness of breath.     No current facility-administered medications for this encounter.    Physical Findings:  vitals were not taken for this visit.  Pain Assessment Pain Score: 0-No pain/ Unable to assess due to telephone follow up visit format.    Lab Findings: Lab Results  Component Value Date   WBC 10.0 04/14/2021   HGB  10.2 (L) 04/14/2021   HCT 31.3 (L) 04/14/2021   MCV 80.3 04/14/2021   PLT 325 04/14/2021     Radiographic Findings: CT ABDOMEN PELVIS WO CONTRAST  Result Date: 04/02/2021 CLINICAL DATA:  65 year old male with altered mental status and questionable pneumoperitoneum on chest x-ray. Metastatic non-small cell lung cancer. EXAM: CT ABDOMEN AND PELVIS WITHOUT CONTRAST TECHNIQUE: Multidetector CT imaging of the abdomen and pelvis was performed following the standard protocol without IV contrast. COMPARISON:  Portable chest 0223 hours today. Restaging CT Chest, Abdomen, and Pelvis 12/07/2020. Recent CTA head and neck 1949 hours yesterday. FINDINGS: Lower chest: Trace pleural effusions are new since July. The known left lower lobe lung mass is not included. But there is new patchy, inflammatory opacity in the lateral basal segment of the right lower lobe (series 5, image 7). Small volume pericardial effusion is increased since July and might have intermediate density on series 3, image 4, but significance is unclear. Hepatobiliary: Stable noncontrast liver. Vicarious contrast excretion to the gallbladder which otherwise appears negative. Pancreas: Negative noncontrast pancreas. Spleen: Diminutive, negative. Adrenals/Urinary Tract: Adrenal glands are within normal limits. But there is abnormal pararenal space stranding and/or fluid, and asymmetric delayed nephrograms with residual renal parenchymal contrast and contrast excretion to the collecting systems. Bilateral retroperitoneal/periureteral stranding, although no hydroureter. Contrast is intermittently present in both ureters to the bladder. The bladder is diminutive but thick-walled, and there is intermediate density filling defect within the bladder on series 7, image 50. But no perivesical stranding. No urinary calculus identified. Stomach/Bowel: Mild retained stool in the rectum but decompressed sigmoid and descending colon. Mild retained gas in the  transverse colon. Decompressed right colon. No dilated small bowel. Enteric tube courses to the gastric body. Stomach and duodenum are decompressed. No free air, free fluid, or bowel mesenteric inflammation identified. Vascular/Lymphatic: Aortoiliac calcified atherosclerosis. Vascular patency is not evaluated in the absence of IV contrast. No lymphadenopathy. Reproductive: Urethral catheter in place. Other: No pelvic free fluid. Musculoskeletal: Chronic L5 pars fractures and spondylolisthesis. Chronic disc and endplate degeneration there. Stable visualized osseous structures. IMPRESSION: 1. Negative for pneumoperitoneum or bowel abnormality. 2. But positive for asymmetric delayed nephrograms (worse on the left) with abundant abnormal pararenal space stranding and/or fluid. But no hydronephrosis or obstructing lesion. Bladder decompressed by Foley catheter, although with intermediate density filling defect in the bladder lumen. Constellation suggests Acute Intrinsic Renal Failure. Query hematuria. Urinalysis recommended. 3. Positive also for patchy opacity in the lateral basal segment of the right lower lobe suspicious for Acute Bronchopneumonia. And trace pleural effusions are new since July.  4. And also new small volume pericardial effusion with possible complex fluid density. Query Pericarditis. 5. Enteric tube terminates in the gastric body. Electronically Signed   By: Odessa Fleming M.D.   On: 04/02/2021 04:49   MR BRAIN W WO CONTRAST  Result Date: 04/02/2021 CLINICAL DATA:  Initial evaluation for neuro deficit, stroke suspected. History of lung cancer, status post resection. EXAM: MRI HEAD WITHOUT AND WITH CONTRAST TECHNIQUE: Multiplanar, multiecho pulse sequences of the brain and surrounding structures were obtained without and with intravenous contrast. CONTRAST:  5.16mL GADAVIST GADOBUTROL 1 MMOL/ML IV SOLN COMPARISON:  Prior CTs from earlier the same day as well as prior CT from 03/13/2021. FINDINGS: Brain:  Examination severely degraded by motion artifact. Postoperative changes from prior left parietal craniotomy for tumor resection again seen. Again seen is an enhancing mass involving the parasagittal left parietal lobe measuring 1.4 x 1.2 cm, consistent with metastasis (series 12, image 20). Surrounding vasogenic edema seen within the adjacent left parieto-occipital region. Diffuse dural thickening and enhancement about the adjacent craniotomy site favored to be postoperative in nature (series 12, image 24). Subtle increased diffusion abnormality within this region noted as well, which could reflect subtle changes of superimposed seizure (series 3, image 39, 31). There is question of subtly increased diffusion signal abnormality within the medial left thalamus (series 3, image 27), not entirely certain on this motion degraded exam. No definite ADC correlate or abnormal enhancement. No other evidence for acute or subacute infarct elsewhere within the brain. No other acute or chronic intracranial hemorrhage. No other visible mass lesion or abnormal enhancement. No midline shift. No hydrocephalus or extra-axial fluid collection. 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 Vascular: Major intracranial vascular flow voids are grossly maintained at the skull base, better evaluated on prior CTA. Skull and upper cervical spine: Craniocervical junction within normal limits. Bone marrow signal intensity normal. No visible focal marrow replacing lesion. Prior left parietal craniotomy. No scalp soft tissue abnormality. Sinuses/Orbits: Globes and orbital soft tissues grossly within normal limits on this motion degraded exam. Scattered mucosal thickening noted about the right maxillary sinus. Paranasal sinuses are otherwise largely clear. No mastoid effusion. Other: None. IMPRESSION: 1. Technically limited exam due to extensive motion artifact. 2. 1.4 cm metastasis involving the parasagittal left parietal  lobe. Surrounding vasogenic edema without midline shift. 3. Diffuse dural thickening and enhancement about the adjacent craniotomy site, favored to be postoperative in nature, although attention at follow-up is recommended. 4. Question subtly increased diffusion signal abnormality at the left thalamus. Although this finding is not entirely certain given motion artifact on this exam, possible subtle changes of ischemia or possibly seizure could be considered. Additional mildly increased diffusion signal within the adjacent left parieto-occipital region could also reflect changes of acute seizure. Correlation with EEG recommended. 5. No other acute intracranial abnormality. Electronically Signed   By: Rise Mu M.D.   On: 04/02/2021 03:25   DG CHEST PORT 1 VIEW  Result Date: 04/03/2021 CLINICAL DATA:  Intubation. EXAM: PORTABLE CHEST 1 VIEW COMPARISON:  April 02, 2021. FINDINGS: The heart size and mediastinal contours are within normal limits. Endotracheal and nasogastric tubes have been removed. Right internal jugular Port-A-Cath is unchanged in position. Mild biapical scarring is noted. Stable rounded density is seen in left lower lobe. The visualized skeletal structures are unremarkable. IMPRESSION: Endotracheal and nasogastric tubes have been removed. Stable nodular density is seen in left lower lobe. Electronically Signed   By: Zenda Alpers.D.  On: 04/03/2021 16:21   DG Chest Portable 1 View  Result Date: 04/02/2021 CLINICAL DATA:  History of lung carcinoma EXAM: PORTABLE CHEST 1 VIEW COMPARISON:  04/01/2021 CT from  02/08/2021 FINDINGS: Cardiac shadow is stable. Gastric catheter is noted within the stomach although the proximal side port lies in the distal esophagus. This should be advanced several cm deeper into the stomach. Endotracheal tube is noted in satisfactory position. Right chest wall port is noted in satisfactory position. Rounded left lower lobe mass lesion is again  identified stable from prior CT. No sizable effusion is noted. No focal infiltrate is seen. Bony structures are within normal limits. IMPRESSION: Tubes and lines as described above. Gastric catheter should be advanced deeper into the stomach. Left lower lobe mass lesion stable from the prior CT. Electronically Signed   By: Inez Catalina M.D.   On: 04/02/2021 02:31   DG Chest Port 1 View  Result Date: 04/01/2021 CLINICAL DATA:  Questionable sepsis. EXAM: PORTABLE CHEST 1 VIEW COMPARISON:  Chest x-ray 08/30/2020.  CT of the chest 02/08/2021. FINDINGS: Right chest port catheter tip projects over the distal SVC. Chronic appearing scarring in the right apex and paramediastinal region appears unchanged. Bullae are again noted in the left lung apex. Nodular densities in the left lower lung persists, grossly unchanged. Artifact overlies is region. There is no new lung infiltrate, pleural effusion or pneumothorax identified. Cardiac silhouette is within normal limits. No acute fractures are seen. Air under the right hemidiaphragm is indeterminate. IMPRESSION: 1. Air under the right hemidiaphragm is indeterminate and may be within air distended bowel. If there is high clinical concern for free air recommend dedicated abdominal series with decubitus view. 2. Stable nodular densities in the left lower lung. No new lung infiltrate. Electronically Signed   By: Ronney Asters M.D.   On: 04/01/2021 21:28   DG Abd Portable 1V  Result Date: 04/03/2021 CLINICAL DATA:  Encounter for orogastric tube placement. History of metastatic lung cancer. EXAM: PORTABLE ABDOMEN - 1 VIEW COMPARISON:  04/02/2021 and chest CT 02/08/2021 FINDINGS: Endotracheal tube is approximately 1.2 cm above the carina. The OG tube is in the left upper abdomen near the gastric fundus. Again noted is a right jugular Port-A-Cath with the tip in the lower SVC region. Heart size is within normal limits and stable. Again noted is a large nodule in the left lower  lobe. Few patchy densities in the right lower lung are unchanged from the recent comparison examination. Negative for pneumothorax. IMPRESSION: 1. OG tube tip in the gastric fundus region. 2.  Endotracheal tube is appropriately positioned. 3. No significant change in the lung densities. Electronically Signed   By: Markus Daft M.D.   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 EEG to evaluate for seizure Level of alertness:  lethargic AEDs during EEG study: LEV Technical aspects: This EEG study was done with scalp electrodes positioned according to the 10-20 International system of electrode placement. Electrical activity was acquired at a sampling rate of $Remov'500Hz'elgsui$  and reviewed with a high frequency filter of $RemoveB'70Hz'quJJLLvi$  and a low frequency filter of $RemoveB'1Hz'biVHQhRf$ . EEG data were recorded continuously and digitally  stored. Description: EEG showed continuous generalized and maximal left posterior temporal region 3 to 6 Hz theta-delta slowing with overriding 15 to 18 Hz generalized beta activity. Lateralized paretic discharges with overriding fast activity were also noted in left posterior temporal region with fluctuating frequency between 0.5 to 1 Hz which at times appeared rhythmic without definite evolution consistent with brief ictal-interictal rhythmic discharges.  Hyperventilation and photic stimulation were not performed.   ABNORMALITY -Periodic discharges with overriding fast activity, left hemisphere, maximal left posterior temporal region ( LPD+) -Brief ictal-interictal rhythmic discharges, left hemisphere, maximal left posterior temporal region -Continuous slow,  generalized and maximal left posterior temporal region IMPRESSION: This study showed evidence of epileptogenicity and cortical dysfunction arising from left hemisphere, maximal left posterior temporal region which is on the ictal-interictal continuum with high potential for seizure recurrence.  Additionally there is moderate diffuse encephalopathy, nonspecific etiology.  No definite seizures were seen. Lora Havens   ECHOCARDIOGRAM COMPLETE  Result Date: 04/02/2021    ECHOCARDIOGRAM REPORT   Patient Name:   YOUSIF EDELSON Date of Exam: 04/02/2021 Medical Rec #:  [id number]       Height:       60.0 in Accession #:    [id number]      Weight:       122.6 lb Date of Birth:  [birth year]      BSA:          1.516 m Patient Age:    62 years        BP:           107/76 mmHg Patient Gender: M               HR:           117 bpm. Exam Location:  Inpatient Procedure: 2D Echo, Cardiac Doppler and Color Doppler Indications:    Pericardial Effusion  History:        Patient has no prior history of Echocardiogram examinations.                 Risk Factors:Hypertension.  Sonographer:    Helmut Muster Referring Phys: [id number] PAULA B SIMPSON  Sonographer Comments: Image acquisition challenging due to patient behavioral factors. and Image acquisition challenging due to patient body habitus. IMPRESSIONS  1. Moderate pericardial effusion that is apical and anterior to the RV. Largest at 17 mm. There is no RV or RA collapse. IVC not well visualized. There is no mitral or tricuspid respirophasic variation in spectral Doppler signal- a marker for increased pericardial pressure.  2. Left ventricular ejection fraction, by estimation, is 60%. The left ventricle has normal function. The left ventricle has no regional wall motion abnormalities. Indeterminate diastolic filling due to E-A fusion.  3. Right ventricular systolic function is normal. The right ventricular size is normal. Tricuspid regurgitation signal is inadequate for  assessing PA pressure.  4. The mitral valve is normal in structure. No evidence of mitral valve regurgitation. No evidence of mitral stenosis.  5. The aortic valve is tricuspid. There is mild thickening of the aortic valve. Aortic valve regurgitation is not visualized. Comparison(s): No prior Echocardiogram. FINDINGS  Left Ventricle: Left ventricular ejection fraction, by estimation, is 60%. The left ventricle has normal function. The left ventricle has no regional wall motion abnormalities. The left ventricular internal cavity size was normal in size. There is no left ventricular hypertrophy. Indeterminate diastolic filling due to E-A fusion. Right Ventricle: The right ventricular size is normal. No increase in right  ventricular wall thickness. Right ventricular systolic function is normal. Tricuspid regurgitation signal is inadequate for assessing PA pressure. Left Atrium: Left atrial size was normal in size. Right Atrium: Right atrial size was normal in size. Pericardium: A moderately sized pericardial effusion is present. The pericardial effusion is surrounding the apex. Mitral Valve: The mitral valve is normal in structure. No evidence of mitral valve regurgitation. No evidence of mitral valve stenosis. Tricuspid Valve: The tricuspid valve is normal in structure. Tricuspid valve regurgitation is not demonstrated. No evidence of tricuspid stenosis. Aortic Valve: The aortic valve is tricuspid. There is mild thickening of the aortic valve. Aortic valve regurgitation is not visualized. Pulmonic Valve: The pulmonic valve was not well visualized. Pulmonic valve regurgitation is not visualized. No evidence of pulmonic stenosis. Aorta: The aortic root is normal in size and structure. IAS/Shunts: The atrial septum is grossly normal.  LEFT VENTRICLE PLAX 2D LVIDd:         4.50 cm     Diastology LVIDs:         3.30 cm     LV e' medial:   4.70 cm/s LV PW:         0.80 cm     LV E/e' medial: 15.1 LV IVS:        0.80 cm LVOT  diam:     2.20 cm LV SV:         71 LV SV Index:   47 LVOT Area:     3.80 cm  LV Volumes (MOD) LV vol d, MOD A2C: 51.0 ml LV vol d, MOD A4C: 53.5 ml LV vol s, MOD A2C: 19.8 ml LV vol s, MOD A4C: 19.3 ml LV SV MOD A2C:     31.2 ml LV SV MOD A4C:     53.5 ml LV SV MOD BP:      34.3 ml RIGHT VENTRICLE RV S prime:     17.40 cm/s TAPSE (M-mode): 2.6 cm LEFT ATRIUM             Index       RIGHT ATRIUM          Index LA diam:        2.70 cm 1.78 cm/m  RA Area:     7.97 cm LA Vol (A2C):   13.2 ml 8.71 ml/m  RA Volume:   12.90 ml 8.51 ml/m LA Vol (A4C):   14.0 ml 9.23 ml/m LA Biplane Vol: 14.3 ml 9.43 ml/m  AORTIC VALVE LVOT Vmax:   123.00 cm/s LVOT Vmean:  69.000 cm/s LVOT VTI:    0.186 m  AORTA Ao Root diam: 3.30 cm Ao Asc diam:  3.10 cm MITRAL VALVE MV Area (PHT): 8.92 cm     SHUNTS MV Decel Time: 85 msec      Systemic VTI:  0.19 m MV E velocity: 71.10 cm/s   Systemic Diam: 2.20 cm MV A velocity: 117.00 cm/s MV E/A ratio:  0.61 Rudean Haskell MD Electronically signed by Rudean Haskell MD Signature Date/Time: 04/02/2021/4:15:24 PM    Final    CT HEAD CODE STROKE WO CONTRAST  Result Date: 04/01/2021 CLINICAL DATA:  Code stroke. EXAM: CT HEAD WITHOUT CONTRAST TECHNIQUE: Contiguous axial images were obtained from the base of the skull through the vertex without intravenous contrast. COMPARISON:  03/13/2021. FINDINGS: Brain: Large area of hypodensity involving the left parietal and occipital lobe; this area previously contained an enhancing lesion and was the subject of recent SRS. No acute hemorrhage, mass effect,  or midline shift. No hydrocephalus or extra-axial collection. Vascular: No hyperdense vessel or unexpected calcification. Skull: Status post right frontotemporal and left parietal craniotomies. No acute osseous abnormality. Sinuses/Orbits: Mucosal thickening in the right maxillary sinus. The orbits are unremarkable. Other: Fluid in the right mastoid air cells. ASPECTS [facility] Stroke Program  Early CT Score) - Ganglionic level infarction (caudate, lentiform nuclei, internal capsule, insula, M1-M3 cortex): 7 - Supraganglionic infarction (M4-M6 cortex): 3 Total score (0-10 with 10 being normal): 10 IMPRESSION: 1. Hypodensity in the left parietal and occipital lobes, which is new from the prior exam but which correlates with an area of recent radiation therapy. This may represent acute infarct or edema related to recent SRS. 2. ASPECTS is 10 Code stroke imaging results were communicated on 04/01/2021 at 7:46 pm to provider Dr. Cheral Marker via secure text paging. Electronically Signed   By: Merilyn Baba M.D.   On: 04/01/2021 19:48   CT ANGIO HEAD NECK W WO CM W PERF (CODE STROKE)  Result Date: 04/01/2021 CLINICAL DATA:  Initial evaluation for neuro deficit, stroke suspected, aphasia. EXAM: CT ANGIOGRAPHY HEAD AND NECK CT PERFUSION BRAIN TECHNIQUE: Multidetector CT imaging of the head and neck was performed using the standard protocol during bolus administration of intravenous contrast. Multiplanar CT image reconstructions and MIPs were obtained to evaluate the vascular anatomy. Carotid stenosis measurements (when applicable) are obtained utilizing NASCET criteria, using the distal internal carotid diameter as the denominator. Multiphase CT imaging of the brain was performed following IV bolus contrast injection. Subsequent parametric perfusion maps were calculated using RAPID software. CONTRAST:  32mL OMNIPAQUE IOHEXOL 350 MG/ML SOLN COMPARISON:  Prior CT from earlier the same day. FINDINGS: CTA NECK FINDINGS Aortic arch: Visualized aortic arch normal caliber with normal 3 vessel morphology. No stenosis about the origin of the great vessels. Right carotid system: Right common and internal carotid arteries widely patent without stenosis, dissection or occlusion. Left carotid system: Left common and internal carotid arteries widely patent without stenosis, dissection or occlusion. Vertebral arteries: Both  vertebral arteries arise from the subclavian arteries. No proximal subclavian artery stenosis. Both vertebral arteries widely patent without stenosis, dissection or occlusion. Skeleton: No discrete or worrisome osseous lesions. C3 and C4 vertebral bodies are fused. Other neck: Asymmetric thickening and enhancement seen about the right masseter muscle (series 5, image 88), nonspecific, but could reflect post radiation changes and/or myositis, which could be either infectious or inflammatory in nature. Adjacent right parotid gland and submandibular glands within normal limits. No other acute abnormality within the neck. Upper chest: Abnormal pleuroparenchymal thickening at the medial right upper lobe/right lung apex, likely reflecting post treatment changes. Severe bolus emphysematous changes within the visualized left lung. 12 mm left upper lobe nodule partially visualized (series 5, image 169). Right-sided Port-A-Cath noted. Review of the MIP images confirms the above findings CTA HEAD FINDINGS Anterior circulation: Both internal carotid arteries widely patent to the termini without stenosis. 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 No residual aneurysm visualized. A1 segments patent bilaterally. Left A1 hypoplastic. Normal anterior communicating artery complex. Anterior cerebral arteries patent to their distal aspects without stenosis. No M1 stenosis or occlusion. Normal MCA bifurcations. Distal MCA branches perfused and symmetric. Posterior circulation: Both V4 segments patent to the vertebrobasilar junction without stenosis. Right PICA patent. Left PICA not well seen. Basilar patent to its distal aspect without stenosis. Superior cerebral arteries patent bilaterally. Both PCAs primarily supplied via the basilar well perfused or distal aspects. Venous  sinuses: Grossly patent a large for timing the contrast bolus. Anatomic variants: None significant. Asymmetric left a  meningeal enhancement at the left parietal region related to vasogenic edema and known metastasis at this location. Review of the MIP images confirms the above findings CT Brain Perfusion Findings: ASPECTS: 10. CBF (<30%) Volume: 90mL Perfusion (Tmax>6.0s) volume: 21mL Mismatch Volume: 50mL Infarction Location:Negative CT perfusion with no evidence for acute ischemia. Increased perfusion is seen within the left parietal region on dedicated perfusion maps, presumably related to vasogenic edema and known metastatic lesion at this location. Changes of acute seizure may be contributory. IMPRESSION: 1. Negative CTA for emergent large vessel occlusion. 2. Negative CT perfusion with no evidence for acute ischemia. 3. Mild for age atheromatous disease. No hemodynamically significant or correctable stenosis about the major arterial vasculature of the head and neck. 4. Increased perfusion with leptomeningeal enhancement about the left parietal region, presumably related to vasogenic edema and known metastasis at this location. Superimposed changes of acute seizure could also be considered. 5. 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 No residual aneurysm visualized. 6. Presumed post treatment changes at the right lung apex, with persistent 12 mm left upper lobe nodule. 7. Asymmetric thickening and enhancement about the right masseter muscle, nonspecific, but suspected to reflect post radiation changes. Acute myositis, which could be either infectious or inflammatory, would be the primary differential consideration. 8. Emphysema (ICD10-J43.9). Results discussed by telephone at the time of interpretation on 04/01/2021 at approximately 8:15 p.m. to provider ERIC [facility] , who verbally acknowledged these results. Electronically Signed   By: Jeannine Boga M.D.   On: 04/01/2021 21:07   Rapid EEG  Result Date: 04/02/2021 Lora Havens, MD     04/02/2021 11:49 AM Patient Name: Chia Rock MRN: [id number] Epilepsy Attending: Lora Havens Referring Physician/Provider: Dr. 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  Rapid EEG to evaluate for seizure. Level of alertness: awake AEDs during EEG study: LEV Technical aspects: This EEG study was done with scalp electrodes positioned according to the 10-20 International system of electrode placement. Electrical activity was acquired at a sampling rate of 500Hz  and reviewed with a high frequency filter of 70Hz  and a low frequency filter of 1Hz . EEG data were recorded continuously and digitally stored. Description: EEG showed continuous generalized 2 to 3 Hz delta slowing with overriding 15-18hz  generalized beta activity. Hyperventilation and photic stimulation were not performed.   Of note, study was technically difficult due to electrode artifact. ABNORMALITY - Continuous slow, generalized - Excessive beta, generalized IMPRESSION: This limited ceribell EEG is suggestive of severe diffuse encephalopathy, nonspecific etiology but could be secondary to medications like benzodiazepines.  No seizures or epileptiform discharges were seen throughout the recording. If suspicion for ictal-interictal activity persists, please consider conventional EEG. Lora Havens    Impression/Plan: 53. 65 y.o. 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   He has recovered well from the effects of his recent [facility] treatment and is currently without complaints.  He has a scheduled follow-up visit with Dr. Mickeal Skinner on 04/29/2021 and will continue to follow under his care going forward.  We will continue to participate in his care via reviewing his scans in the multidisciplinary conference and we discussed that we are more than happy to continue to participate in his care if  clinically indicated but otherwise  we will plan to see him back on an as-needed basis.  He will continue with serial CT head W & WO contrast every 2-3 months, at the discretion of Dr. Mickeal Skinner, to continue to monitor for any evidence of disease recurrence or progression in the brain since the patient is unable to have MRI brain scans.  We have enjoyed taking care of him and look forward to continuing to follow his progress via correspondence.    Nicholos Johns, PA-C

## 2021-04-29 ENCOUNTER — Inpatient Hospital Stay: Payer: Medicaid Other | Admitting: Internal Medicine

## 2021-04-29 ENCOUNTER — Inpatient Hospital Stay: Payer: Medicaid Other | Admitting: Nurse Practitioner

## 2021-04-30 ENCOUNTER — Other Ambulatory Visit: Payer: Self-pay

## 2021-04-30 ENCOUNTER — Inpatient Hospital Stay (HOSPITAL_BASED_OUTPATIENT_CLINIC_OR_DEPARTMENT_OTHER): Payer: Medicaid Other | Admitting: Nurse Practitioner

## 2021-04-30 ENCOUNTER — Inpatient Hospital Stay (HOSPITAL_BASED_OUTPATIENT_CLINIC_OR_DEPARTMENT_OTHER): Payer: Medicaid Other | Admitting: Internal Medicine

## 2021-04-30 VITALS — BP 135/81 | HR 97 | Temp 97.9°F | Resp 17 | Ht 60.0 in | Wt 126.0 lb

## 2021-04-30 DIAGNOSIS — Z515 Encounter for palliative care: Secondary | ICD-10-CM

## 2021-04-30 DIAGNOSIS — C3411 Malignant neoplasm of upper lobe, right bronchus or lung: Secondary | ICD-10-CM

## 2021-04-30 DIAGNOSIS — Z7189 Other specified counseling: Secondary | ICD-10-CM

## 2021-04-30 DIAGNOSIS — C7931 Secondary malignant neoplasm of brain: Secondary | ICD-10-CM | POA: Diagnosis not present

## 2021-04-30 DIAGNOSIS — G40201 Localization-related (focal) (partial) symptomatic epilepsy and epileptic syndromes with complex partial seizures, not intractable, with status epilepticus: Secondary | ICD-10-CM

## 2021-04-30 DIAGNOSIS — R569 Unspecified convulsions: Secondary | ICD-10-CM | POA: Diagnosis not present

## 2021-04-30 NOTE — Progress Notes (Signed)
[facility]  [phone] [phone]   Name: [name] [name] Date: 04/30/2021 MRN: [id number]  DOB: 05/29/1955  Patient Care Team: [name], NP as PCP - General (Internal Medicine) Curt Bears, MD as Consulting Physician (Oncology) Brunetta Genera, MD as Consulting Physician (Hematology) Maryanna Shape, NP as Nurse Practitioner (Oncology)    REASON FOR CONSULTATION: [name] [name] is a 65 y.o. male with medical history of COPD, hypertension, and metastatic NSCLC s/p chemotherapy/immunotherapy, craniotomy (08/2020) w/p SRS to brain.  Palliative ask to follow for ongoing goals of care support.    SOCIAL HISTORY:     reports that he has quit smoking. His smoking use included cigarettes. He smoked an average of .5 packs per day. He has never used smokeless tobacco. He reports that he does not currently use alcohol. He reports that he does not use drugs.  ADVANCE DIRECTIVES:  On file. [name] [name] is documented Health Care Power of Attorney  CODE STATUS: Full code  PAST MEDICAL HISTORY: Past Medical History:  Diagnosis Date   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[place]) 12/25/2019   Cancer ([place])    COPD (chronic obstructive pulmonary disease) ([place]) 11/02/2007   Qualifier: Diagnosis of  By: Melvyn Novas MD, Christena Deem    Hypertension 12/24/2019   Iron deficiency anemia 08/23/2007   Qualifier: Diagnosis of  By: Jim Like      PAST SURGICAL HISTORY:  Past Surgical History:  Procedure Laterality Date   APPLICATION OF CRANIAL NAVIGATION N/A 08/30/2020   Procedure: APPLICATION OF CRANIAL NAVIGATION;  Surgeon: Consuella Lose, MD;  Location: [facility];  Service: Neurosurgery;  Laterality: N/A;   CRANIOTOMY Left 08/30/2020   Procedure: Stereotactic left frontoparietal craniectomy for resection of tumor with brainlab;  Surgeon: Consuella Lose, MD;  Location: [facility];  Service: Neurosurgery;  Laterality: Left;   ENDOBRONCHIAL  ULTRASOUND N/A 12/27/2019   Procedure: ENDOBRONCHIAL ULTRASOUND;  Surgeon: Laurin Coder, MD;  Location: [facility];  Service: Endoscopy;  Laterality: N/A;   FINE NEEDLE ASPIRATION  12/27/2019   Procedure: FINE NEEDLE ASPIRATION;  Surgeon: Laurin Coder, MD;  Location: [facility];  Service: Endoscopy;;   IR IMAGING GUIDED PORT INSERTION  11/30/2020   VIDEO BRONCHOSCOPY N/A 12/27/2019   Procedure: VIDEO BRONCHOSCOPY WITHOUT FLUORO;  Surgeon: Laurin Coder, MD;  Location: [facility];  Service: Endoscopy;  Laterality: N/A;    HEMATOLOGY/ONCOLOGY HISTORY:  Oncology History  Malignant neoplasm of bronchus of right upper lobe ([place])  01/03/2020 Initial Diagnosis   Adenocarcinoma of right lung, stage 3 ([place])   01/17/2020 - 02/28/2020 Chemotherapy   The patient had palonosetron (ALOXI) injection 0.25 mg, 0.25 mg, Intravenous,  Once, 7 of 7 cycles Administration: 0.25 mg (01/17/2020), 0.25 mg (02/14/2020), 0.25 mg (02/21/2020), 0.25 mg (01/24/2020), 0.25 mg (02/28/2020), 0.25 mg (01/31/2020), 0.25 mg (02/07/2020) CARBOplatin (PARAPLATIN) 190 mg in sodium chloride 0.9 % 250 mL chemo infusion, 190 mg (100 % of original dose 189.8 mg), Intravenous,  Once, 7 of 7 cycles Dose modification: 189.8 mg (original dose 189.8 mg, Cycle 1) Administration: 190 mg (01/17/2020), 190 mg (02/14/2020), 190 mg (02/21/2020), 190 mg (01/24/2020), 190 mg (02/28/2020), 190 mg (01/31/2020), 190 mg (02/07/2020) PACLitaxel (TAXOL) 66 mg in sodium chloride 0.9 % 150 mL chemo infusion (</= 80mg /m2), 45 mg/m2 = 66 mg, Intravenous,  Once, 7 of 7 cycles Administration: 66 mg (01/17/2020), 66 mg (02/14/2020), 66 mg (02/21/2020), 66 mg (01/24/2020), 66 mg (02/28/2020), 66 mg (01/31/2020), 66 mg (02/07/2020)   for chemotherapy  treatment.     04/09/2020 - 07/30/2020 Chemotherapy          09/28/2020 Cancer Staging   Staging form: Lung, AJCC 8th Edition - Clinical: Stage IVA (cTX, cN2, cM1b) - Signed by Curt Bears, MD on  09/28/2020    10/08/2020 -  Chemotherapy   Patient is on Treatment Plan : LUNG CARBOplatin / Pemetrexed / Pembrolizumab q21d Induction x 4 cycles / Maintenance Pemetrexed + Pembrolizumab       ALLERGIES:  is allergic to other.  MEDICATIONS:  Current Outpatient Medications  Medication Sig Dispense Refill   acetaminophen (TYLENOL) 500 MG tablet Take 500 mg by mouth every 6 (six) hours as needed for mild pain or moderate pain.     budesonide-formoterol (SYMBICORT) 80-4.5 MCG/ACT inhaler Inhale 2 puffs into the lungs 2 (two) times daily. 1 each 11   clonazePAM (KLONOPIN) 0.25 MG disintegrating tablet Take 1 tablet (0.25 mg total) by mouth 2 (two) times daily. 30 tablet 0   dexamethasone (DECADRON) 4 MG tablet Take 4mg  three times daily for 5 days, then 4mg  twice daily.     feeding supplement (ENSURE ENLIVE / ENSURE PLUS) LIQD Take 237 mLs by mouth 2 (two) times daily between meals. 161 mL 12   folic acid (FOLVITE) 1 MG tablet Take 1 tablet (1 mg total) by mouth daily. 30 tablet 4   ipratropium-albuterol (DUONEB) 0.5-2.5 (3) MG/3ML SOLN Take 3 mLs by nebulization every 4 (four) hours as needed. 360 mL    levETIRAcetam (KEPPRA) 1000 MG tablet Take 1 tablet (1,000 mg total) by mouth 2 (two) times daily.     lidocaine-prilocaine (EMLA) cream Apply 1 application topically as needed. 30 g 1   Multiple Vitamin (MULTIVITAMIN) tablet Take 1 tablet by mouth daily.     pantoprazole (PROTONIX) 40 MG tablet Take 1 tablet (40 mg total) by mouth daily with supper. 30 tablet 2   polyethylene glycol (MIRALAX / GLYCOLAX) 17 g packet Take 17 g by mouth daily. 14 each 0   prochlorperazine (COMPAZINE) 10 MG tablet Take 1 tablet (10 mg total) by mouth every 6 (six) hours as needed for nausea or vomiting. 30 tablet 0   senna (SENOKOT) 8.6 MG TABS tablet Take 1 tablet (8.6 mg total) by mouth 2 (two) times daily. 120 tablet 0   tiotropium (SPIRIVA HANDIHALER) 18 MCG inhalation capsule Place 1 capsule into inhaler and  inhale daily. 30 capsule 1   VENTOLIN HFA 108 (90 Base) MCG/ACT inhaler Inhale 2 puffs into the lungs every 4 (four) hours as needed for wheezing or shortness of breath.     No current facility-administered medications for this visit.    VITAL SIGNS: There were no vitals taken for this visit. There were no vitals filed for this visit.  Estimated body mass index is 24.61 kg/m as calculated from the following:   Height as of an earlier encounter on 04/30/21: 5' (1.524 m).   Weight as of an earlier encounter on 04/30/21: 126 lb (57.2 kg).  LABS: CBC:    Component Value Date/Time   WBC 10.0 04/14/2021 0155   HGB 10.2 (L) 04/14/2021 0155   HGB 11.1 (L) 03/04/2021 0916   HGB 12.3 (L) 08/03/2019 1527   HCT 31.3 (L) 04/14/2021 0155   HCT 38.5 08/03/2019 1527   PLT 325 04/14/2021 0155   PLT 329 03/04/2021 0916   PLT 320 08/03/2019 1527   MCV 80.3 04/14/2021 0155   MCV 76 (L) 08/03/2019 1527   NEUTROABS 4.3 04/06/2021  0500   NEUTROABS 3.2 08/03/2019 1527   LYMPHSABS 0.6 (L) 04/06/2021 0500   LYMPHSABS 2.3 08/03/2019 1527   MONOABS 0.5 04/06/2021 0500   EOSABS 0.0 04/06/2021 0500   EOSABS 0.1 08/03/2019 1527   BASOSABS 0.0 04/06/2021 0500   BASOSABS 0.0 08/03/2019 1527   Comprehensive Metabolic Panel:    Component Value Date/Time   NA 134 (L) 04/14/2021 0155   NA 144 08/03/2019 1527   K 3.9 04/14/2021 0155   CL 98 04/14/2021 0155   CO2 27 04/14/2021 0155   BUN 18 04/14/2021 0155   BUN 9 08/03/2019 1527   CREATININE 0.74 04/14/2021 0155   CREATININE 0.68 03/04/2021 0916   GLUCOSE 115 (H) 04/14/2021 0155   CALCIUM 9.2 04/14/2021 0155   AST 27 04/01/2021 1859   AST 24 03/04/2021 0916   ALT 12 04/01/2021 1859   ALT 13 03/04/2021 0916   ALKPHOS 53 04/01/2021 1859   BILITOT 0.4 04/01/2021 1859   BILITOT 0.3 03/04/2021 0916   PROT 6.7 04/01/2021 1859   PROT 7.3 08/03/2019 1527   ALBUMIN 2.6 (L) 04/05/2021 0500   ALBUMIN 4.6 08/03/2019 1527    RADIOGRAPHIC STUDIES: CT  ABDOMEN PELVIS WO CONTRAST  Result Date: 04/02/2021 CLINICAL DATA:  65 year old male with altered mental status and questionable pneumoperitoneum on chest x-ray. Metastatic non-small cell lung cancer. EXAM: CT ABDOMEN AND PELVIS WITHOUT CONTRAST TECHNIQUE: Multidetector CT imaging of the abdomen and pelvis was performed following the standard protocol without IV contrast. COMPARISON:  Portable chest 0223 hours today. Restaging CT Chest, Abdomen, and Pelvis 12/07/2020. Recent CTA head and neck 1949 hours yesterday. FINDINGS: Lower chest: Trace pleural effusions are new since July. The known left lower lobe lung mass is not included. But there is new patchy, inflammatory opacity in the lateral basal segment of the right lower lobe (series 5, image 7). Small volume pericardial effusion is increased since July and might have intermediate density on series 3, image 4, but significance is unclear. Hepatobiliary: Stable noncontrast liver. Vicarious contrast excretion to the gallbladder which otherwise appears negative. Pancreas: Negative noncontrast pancreas. Spleen: Diminutive, negative. Adrenals/Urinary Tract: Adrenal glands are within normal limits. But there is abnormal pararenal space stranding and/or fluid, and asymmetric delayed nephrograms with residual renal parenchymal contrast and contrast excretion to the collecting systems. Bilateral retroperitoneal/periureteral stranding, although no hydroureter. Contrast is intermittently present in both ureters to the bladder. The bladder is diminutive but thick-walled, and there is intermediate density filling defect within the bladder on series 7, image 50. But no perivesical stranding. No urinary calculus identified. Stomach/Bowel: Mild retained stool in the rectum but decompressed sigmoid and descending colon. Mild retained gas in the transverse colon. Decompressed right colon. No dilated small bowel. Enteric tube courses to the gastric body. Stomach and duodenum are  decompressed. No free air, free fluid, or bowel mesenteric inflammation identified. Vascular/Lymphatic: Aortoiliac calcified atherosclerosis. Vascular patency is not evaluated in the absence of IV contrast. No lymphadenopathy. Reproductive: Urethral catheter in place. Other: No pelvic free fluid. Musculoskeletal: Chronic L5 pars fractures and spondylolisthesis. Chronic disc and endplate degeneration there. Stable visualized osseous structures. IMPRESSION: 1. Negative for pneumoperitoneum or bowel abnormality. 2. But positive for asymmetric delayed nephrograms (worse on the left) with abundant abnormal pararenal space stranding and/or fluid. But no hydronephrosis or obstructing lesion. Bladder decompressed by Foley catheter, although with intermediate density filling defect in the bladder lumen. Constellation suggests Acute Intrinsic Renal Failure. Query hematuria. Urinalysis recommended. 3. Positive also for patchy opacity in the  lateral basal segment of the right lower lobe suspicious for Acute Bronchopneumonia. And trace pleural effusions are new since July. 4. And also new small volume pericardial effusion with possible complex fluid density. Query Pericarditis. 5. Enteric tube terminates in the gastric body. Electronically Signed   By: Genevie Ann M.D.   On: 04/02/2021 04:49   MR BRAIN W WO CONTRAST  Result Date: 04/02/2021 CLINICAL DATA:  Initial evaluation for neuro deficit, stroke suspected. History of lung cancer, status post resection. EXAM: MRI HEAD WITHOUT AND WITH CONTRAST TECHNIQUE: Multiplanar, multiecho pulse sequences of the brain and surrounding structures were obtained without and with intravenous contrast. CONTRAST:  5.67mL GADAVIST GADOBUTROL 1 MMOL/ML IV SOLN COMPARISON:  Prior CTs from earlier the same day as well as prior CT from 03/13/2021. FINDINGS: Brain: Examination severely degraded by motion artifact. Postoperative changes from prior left parietal craniotomy for tumor resection again  seen. Again seen is an enhancing mass involving the parasagittal left parietal lobe measuring 1.4 x 1.2 cm, consistent with metastasis (series 12, image 20). Surrounding vasogenic edema seen within the adjacent left parieto-occipital region. Diffuse dural thickening and enhancement about the adjacent craniotomy site favored to be postoperative in nature (series 12, image 24). Subtle increased diffusion abnormality within this region noted as well, which could reflect subtle changes of superimposed seizure (series 3, image 39, 31). There is question of subtly increased diffusion signal abnormality within the medial left thalamus (series 3, image 27), not entirely certain on this motion degraded exam. No definite ADC correlate or abnormal enhancement. No other evidence for acute or subacute infarct elsewhere within the brain. No other acute or chronic intracranial hemorrhage. No other visible mass lesion or abnormal enhancement. No midline shift. No hydrocephalus or extra-axial fluid collection. 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 Vascular: Major intracranial vascular flow voids are grossly maintained at the skull base, better evaluated on prior CTA. Skull and upper cervical spine: Craniocervical junction within normal limits. Bone marrow signal intensity normal. No visible focal marrow replacing lesion. Prior left parietal craniotomy. No scalp soft tissue abnormality. Sinuses/Orbits: Globes and orbital soft tissues grossly within normal limits on this motion degraded exam. Scattered mucosal thickening noted about the right maxillary sinus. Paranasal sinuses are otherwise largely clear. No mastoid effusion. Other: None. IMPRESSION: 1. Technically limited exam due to extensive motion artifact. 2. 1.4 cm metastasis involving the parasagittal left parietal lobe. Surrounding vasogenic edema without midline shift. 3. Diffuse dural thickening and enhancement about the adjacent craniotomy site,  favored to be postoperative in nature, although attention at follow-up is recommended. 4. Question subtly increased diffusion signal abnormality at the left thalamus. Although this finding is not entirely certain given motion artifact on this exam, possible subtle changes of ischemia or possibly seizure could be considered. Additional mildly increased diffusion signal within the adjacent left parieto-occipital region could also reflect changes of acute seizure. Correlation with EEG recommended. 5. No other acute intracranial abnormality. Electronically Signed   By: Jeannine Boga M.D.   On: 04/02/2021 03:25   DG CHEST PORT 1 VIEW  Result Date: 04/03/2021 CLINICAL DATA:  Intubation. EXAM: PORTABLE CHEST 1 VIEW COMPARISON:  April 02, 2021. FINDINGS: The heart size and mediastinal contours are within normal limits. Endotracheal and nasogastric tubes have been removed. Right internal jugular Port-A-Cath is unchanged in position. Mild biapical scarring is noted. Stable rounded density is seen in left lower lobe. The visualized skeletal structures are unremarkable. IMPRESSION: Endotracheal and nasogastric tubes have been removed. Stable  nodular density is seen in left lower lobe. Electronically Signed   By: Marijo Conception M.D.   On: 04/03/2021 16:21   DG Chest Portable 1 View  Result Date: 04/02/2021 CLINICAL DATA:  History of lung carcinoma EXAM: PORTABLE CHEST 1 VIEW COMPARISON:  04/01/2021 CT from  02/08/2021 FINDINGS: Cardiac shadow is stable. Gastric catheter is noted within the stomach although the proximal side port lies in the distal esophagus. This should be advanced several cm deeper into the stomach. Endotracheal tube is noted in satisfactory position. Right chest wall port is noted in satisfactory position. Rounded left lower lobe mass lesion is again identified stable from prior CT. No sizable effusion is noted. No focal infiltrate is seen. Bony structures are within normal limits.  IMPRESSION: Tubes and lines as described above. Gastric catheter should be advanced deeper into the stomach. Left lower lobe mass lesion stable from the prior CT. Electronically Signed   By: Inez Catalina M.D.   On: 04/02/2021 02:31   DG Chest Port 1 View  Result Date: 04/01/2021 CLINICAL DATA:  Questionable sepsis. EXAM: PORTABLE CHEST 1 VIEW COMPARISON:  Chest x-ray 08/30/2020.  CT of the chest 02/08/2021. FINDINGS: Right chest port catheter tip projects over the distal SVC. Chronic appearing scarring in the right apex and paramediastinal region appears unchanged. Bullae are again noted in the left lung apex. Nodular densities in the left lower lung persists, grossly unchanged. Artifact overlies is region. There is no new lung infiltrate, pleural effusion or pneumothorax identified. Cardiac silhouette is within normal limits. No acute fractures are seen. Air under the right hemidiaphragm is indeterminate. IMPRESSION: 1. Air under the right hemidiaphragm is indeterminate and may be within air distended bowel. If there is high clinical concern for free air recommend dedicated abdominal series with decubitus view. 2. Stable nodular densities in the left lower lung. No new lung infiltrate. Electronically Signed   By: Ronney Asters M.D.   On: 04/01/2021 21:28   DG Abd Portable 1V  Result Date: 04/03/2021 CLINICAL DATA:  Encounter for orogastric tube placement. History of metastatic lung cancer. EXAM: PORTABLE ABDOMEN - 1 VIEW COMPARISON:  04/02/2021 and chest CT 02/08/2021 FINDINGS: Endotracheal tube is approximately 1.2 cm above the carina. The OG tube is in the left upper abdomen near the gastric fundus. Again noted is a right jugular Port-A-Cath with the tip in the lower SVC region. Heart size is within normal limits and stable. Again noted is a large nodule in the left lower lobe. Few patchy densities in the right lower lung are unchanged from the recent comparison examination. Negative for pneumothorax.  IMPRESSION: 1. OG tube tip in the gastric fundus region. 2.  Endotracheal tube is appropriately positioned. 3. No significant change in the lung densities. Electronically Signed   By: Markus Daft M.D.   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 EEG to evaluate for seizure Level of alertness:  lethargic AEDs during EEG study: LEV Technical aspects: This EEG study was done with scalp electrodes positioned according to the 10-20 International system of electrode placement. Electrical activity was acquired at a sampling rate of 500Hz  and reviewed with  a high frequency filter of 70Hz  and a low frequency filter of 1Hz . EEG data were recorded continuously and digitally stored. Description: EEG showed continuous generalized and maximal left posterior temporal region 3 to 6 Hz theta-delta slowing with overriding 15 to 18 Hz generalized beta activity. Lateralized paretic discharges with overriding fast activity were also noted in left posterior temporal region with fluctuating frequency between 0.5 to 1 Hz which at times appeared rhythmic without definite evolution consistent with brief ictal-interictal rhythmic discharges.  Hyperventilation and photic stimulation were not performed.   ABNORMALITY -Periodic discharges with overriding fast activity, left hemisphere, maximal left posterior temporal region ( LPD+) -Brief ictal-interictal rhythmic discharges, left hemisphere, maximal left posterior temporal region -Continuous slow, generalized and maximal left posterior temporal region IMPRESSION: This study showed evidence of epileptogenicity and cortical  dysfunction arising from left hemisphere, maximal left posterior temporal region which is on the ictal-interictal continuum with high potential for seizure recurrence.  Additionally there is moderate diffuse encephalopathy, nonspecific etiology.  No definite seizures were seen. Lora Havens   ECHOCARDIOGRAM COMPLETE  Result Date: 04/02/2021    ECHOCARDIOGRAM REPORT   Patient Name:   MERVILLE HIJAZI Date of Exam: 04/02/2021 Medical Rec #:  [id number]       Height:       60.0 in Accession #:    [id number]      Weight:       122.6 lb Date of Birth:  09-04-1955      BSA:          1.516 m Patient Age:    10 years        BP:           107/76 mmHg Patient Gender: M               HR:           117 bpm. Exam Location:  Inpatient Procedure: 2D Echo, Cardiac Doppler and Color Doppler Indications:    Pericardial Effusion  History:        Patient has no prior history of Echocardiogram examinations.                 Risk Factors:Hypertension.  Sonographer:    Helmut Muster Referring Phys: [id number] PAULA B SIMPSON  Sonographer Comments: Image acquisition challenging due to patient behavioral factors. and Image acquisition challenging due to patient body habitus. IMPRESSIONS  1. Moderate pericardial effusion that is apical and anterior to the RV. Largest at 17 mm. There is no RV or RA collapse. IVC not well visualized. There is no mitral or tricuspid respirophasic variation in spectral Doppler signal- a marker for increased pericardial pressure.  2. Left ventricular ejection fraction, by estimation, is 60%. The left ventricle has normal function. The left ventricle has no regional wall motion abnormalities. Indeterminate diastolic filling due to E-A fusion.  3. Right ventricular systolic function is normal. The right ventricular size is normal. Tricuspid regurgitation signal is inadequate for assessing PA pressure.  4. The mitral valve is normal in structure. No evidence of mitral valve regurgitation. No evidence of mitral  stenosis.  5. The aortic valve is tricuspid. There is mild thickening of the aortic valve. Aortic valve regurgitation is not visualized. Comparison(s): No prior Echocardiogram. FINDINGS  Left Ventricle: Left ventricular ejection fraction, by estimation, is 60%. The left ventricle has normal function. The left ventricle has no regional wall motion abnormalities. The left ventricular internal cavity size was normal in size. There is no left ventricular  hypertrophy. Indeterminate diastolic filling due to E-A fusion. Right Ventricle: The right ventricular size is normal. No increase in right ventricular wall thickness. Right ventricular systolic function is normal. Tricuspid regurgitation signal is inadequate for assessing PA pressure. Left Atrium: Left atrial size was normal in size. Right Atrium: Right atrial size was normal in size. Pericardium: A moderately sized pericardial effusion is present. The pericardial effusion is surrounding the apex. Mitral Valve: The mitral valve is normal in structure. No evidence of mitral valve regurgitation. No evidence of mitral valve stenosis. Tricuspid Valve: The tricuspid valve is normal in structure. Tricuspid valve regurgitation is not demonstrated. No evidence of tricuspid stenosis. Aortic Valve: The aortic valve is tricuspid. There is mild thickening of the aortic valve. Aortic valve regurgitation is not visualized. Pulmonic Valve: The pulmonic valve was not well visualized. Pulmonic valve regurgitation is not visualized. No evidence of pulmonic stenosis. Aorta: The aortic root is normal in size and structure. IAS/Shunts: The atrial septum is grossly normal.  LEFT VENTRICLE PLAX 2D LVIDd:         4.50 cm     Diastology LVIDs:         3.30 cm     LV e' medial:   4.70 cm/s LV PW:         0.80 cm     LV E/e' medial: 15.1 LV IVS:        0.80 cm LVOT diam:     2.20 cm LV SV:         71 LV SV Index:   47 LVOT Area:     3.80 cm  LV Volumes (MOD) LV vol d, MOD A2C: 51.0 ml LV vol d,  MOD A4C: 53.5 ml LV vol s, MOD A2C: 19.8 ml LV vol s, MOD A4C: 19.3 ml LV SV MOD A2C:     31.2 ml LV SV MOD A4C:     53.5 ml LV SV MOD BP:      34.3 ml RIGHT VENTRICLE RV S prime:     17.40 cm/s TAPSE (M-mode): 2.6 cm LEFT ATRIUM             Index       RIGHT ATRIUM          Index LA diam:        2.70 cm 1.78 cm/m  RA Area:     7.97 cm LA Vol (A2C):   13.2 ml 8.71 ml/m  RA Volume:   12.90 ml 8.51 ml/m LA Vol (A4C):   14.0 ml 9.23 ml/m LA Biplane Vol: 14.3 ml 9.43 ml/m  AORTIC VALVE LVOT Vmax:   123.00 cm/s LVOT Vmean:  69.000 cm/s LVOT VTI:    0.186 m  AORTA Ao Root diam: 3.30 cm Ao Asc diam:  3.10 cm MITRAL VALVE MV Area (PHT): 8.92 cm     SHUNTS MV Decel Time: 85 msec      Systemic VTI:  0.19 m MV E velocity: 71.10 cm/s   Systemic Diam: 2.20 cm MV A velocity: 117.00 cm/s MV E/A ratio:  0.61 Rudean Haskell MD Electronically signed by Rudean Haskell MD Signature Date/Time: 04/02/2021/4:15:24 PM    Final    CT HEAD CODE STROKE WO CONTRAST  Result Date: 04/01/2021 CLINICAL DATA:  Code stroke. EXAM: CT HEAD WITHOUT CONTRAST TECHNIQUE: Contiguous axial images were obtained from the base of the skull through the vertex without intravenous contrast. COMPARISON:  03/13/2021. FINDINGS: Brain: Large area of hypodensity involving the left parietal and occipital  lobe; this area previously contained an enhancing lesion and was the subject of recent SRS. No acute hemorrhage, mass effect, or midline shift. No hydrocephalus or extra-axial collection. Vascular: No hyperdense vessel or unexpected calcification. Skull: Status post right frontotemporal and left parietal craniotomies. No acute osseous abnormality. Sinuses/Orbits: Mucosal thickening in the right maxillary sinus. The orbits are unremarkable. Other: Fluid in the right mastoid air cells. ASPECTS [facility] Stroke Program Early CT Score) - Ganglionic level infarction (caudate, lentiform nuclei, internal capsule, insula, M1-M3 cortex): 7 -  Supraganglionic infarction (M4-M6 cortex): 3 Total score (0-10 with 10 being normal): 10 IMPRESSION: 1. Hypodensity in the left parietal and occipital lobes, which is new from the prior exam but which correlates with an area of recent radiation therapy. This may represent acute infarct or edema related to recent SRS. 2. ASPECTS is 10 Code stroke imaging results were communicated on 04/01/2021 at 7:46 pm to provider Dr. Cheral Marker via secure text paging. Electronically Signed   By: Merilyn Baba M.D.   On: 04/01/2021 19:48   CT ANGIO HEAD NECK W WO CM W PERF (CODE STROKE)  Result Date: 04/01/2021 CLINICAL DATA:  Initial evaluation for neuro deficit, stroke suspected, aphasia. EXAM: CT ANGIOGRAPHY HEAD AND NECK CT PERFUSION BRAIN TECHNIQUE: Multidetector CT imaging of the head and neck was performed using the standard protocol during bolus administration of intravenous contrast. Multiplanar CT image reconstructions and MIPs were obtained to evaluate the vascular anatomy. Carotid stenosis measurements (when applicable) are obtained utilizing NASCET criteria, using the distal internal carotid diameter as the denominator. Multiphase CT imaging of the brain was performed following IV bolus contrast injection. Subsequent parametric perfusion maps were calculated using RAPID software. CONTRAST:  20mL OMNIPAQUE IOHEXOL 350 MG/ML SOLN COMPARISON:  Prior CT from earlier the same day. FINDINGS: CTA NECK FINDINGS Aortic arch: Visualized aortic arch normal caliber with normal 3 vessel morphology. No stenosis about the origin of the great vessels. Right carotid system: Right common and internal carotid arteries widely patent without stenosis, dissection or occlusion. Left carotid system: Left common and internal carotid arteries widely patent without stenosis, dissection or occlusion. Vertebral arteries: Both vertebral arteries arise from the subclavian arteries. No proximal subclavian artery stenosis. Both vertebral arteries  widely patent without stenosis, dissection or occlusion. Skeleton: No discrete or worrisome osseous lesions. C3 and C4 vertebral bodies are fused. Other neck: Asymmetric thickening and enhancement seen about the right masseter muscle (series 5, image 88), nonspecific, but could reflect post radiation changes and/or myositis, which could be either infectious or inflammatory in nature. Adjacent right parotid gland and submandibular glands within normal limits. No other acute abnormality within the neck. Upper chest: Abnormal pleuroparenchymal thickening at the medial right upper lobe/right lung apex, likely reflecting post treatment changes. Severe bolus emphysematous changes within the visualized left lung. 12 mm left upper lobe nodule partially visualized (series 5, image 169). Right-sided Port-A-Cath noted. Review of the MIP images confirms the above findings CTA HEAD FINDINGS Anterior circulation: Both internal carotid arteries widely patent to the termini without stenosis. 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 No residual aneurysm visualized. A1 segments patent bilaterally. Left A1 hypoplastic. Normal anterior communicating artery complex. Anterior cerebral arteries patent to their distal aspects without stenosis. No M1 stenosis or occlusion. Normal MCA bifurcations. Distal MCA branches perfused and symmetric. Posterior circulation: Both V4 segments patent to the vertebrobasilar junction without stenosis. Right PICA patent. Left PICA not well seen. Basilar patent to its distal aspect  without stenosis. Superior cerebral arteries patent bilaterally. Both PCAs primarily supplied via the basilar well perfused or distal aspects. Venous sinuses: Grossly patent a large for timing the contrast bolus. Anatomic variants: None significant. Asymmetric left a meningeal enhancement at the left parietal region related to vasogenic edema and known metastasis at this location. Review of  the MIP images confirms the above findings CT Brain Perfusion Findings: ASPECTS: 10. CBF (<30%) Volume: 7mL Perfusion (Tmax>6.0s) volume: 42mL Mismatch Volume: 63mL Infarction Location:Negative CT perfusion with no evidence for acute ischemia. Increased perfusion is seen within the left parietal region on dedicated perfusion maps, presumably related to vasogenic edema and known metastatic lesion at this location. Changes of acute seizure may be contributory. IMPRESSION: 1. Negative CTA for emergent large vessel occlusion. 2. Negative CT perfusion with no evidence for acute ischemia. 3. Mild for age atheromatous disease. No hemodynamically significant or correctable stenosis about the major arterial vasculature of the head and neck. 4. Increased perfusion with leptomeningeal enhancement about the left parietal region, presumably related to vasogenic edema and known metastasis at this location. Superimposed changes of acute seizure could also be considered. 5. 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 No residual aneurysm visualized. 6. Presumed post treatment changes at the right lung apex, with persistent 12 mm left upper lobe nodule. 7. Asymmetric thickening and enhancement about the right masseter muscle, nonspecific, but suspected to reflect post radiation changes. Acute myositis, which could be either infectious or inflammatory, would be the primary differential consideration. 8. Emphysema (ICD10-J43.9). Results discussed by telephone at the time of interpretation on 04/01/2021 at approximately 8:15 p.m. to provider [facility] , who verbally acknowledged these results. Electronically Signed   By: Jeannine Boga M.D.   On: 04/01/2021 21:07   Rapid EEG  Result Date: 04/02/2021 Lora Havens, MD     04/02/2021 11:49 AM Patient Name: Jahad Old MRN: [id number] Epilepsy Attending: Lora Havens Referring Physician/Provider: Dr. 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  Rapid EEG to evaluate for seizure. Level of alertness: awake AEDs during EEG study: LEV Technical aspects: This EEG study was done with scalp electrodes positioned according to the 10-20 International system of electrode placement. Electrical activity was acquired at a sampling rate of 500Hz  and reviewed with a high frequency filter of 70Hz  and a low frequency filter of 1Hz . EEG data were recorded continuously and digitally stored. Description: EEG showed continuous generalized 2 to 3 Hz delta slowing with overriding 15-18hz  generalized beta activity. Hyperventilation and photic stimulation were not performed.   Of note, study was technically difficult due to electrode artifact. ABNORMALITY - Continuous slow, generalized - Excessive beta, generalized IMPRESSION: This limited ceribell EEG is suggestive of severe diffuse encephalopathy, nonspecific etiology but could be secondary to medications like benzodiazepines.  No seizures or epileptiform discharges were seen throughout the recording. If suspicion for ictal-interictal activity persists, please consider conventional EEG. [place]    PERFORMANCE STATUS (ECOG) : 2 - Symptomatic, <50% confined to bed  Review of Systems Unless otherwise noted, a complete review of systems is negative.  Physical Exam General: NAD Cardiovascular: regular rate and rhythm Pulmonary: clear ant fields Abdomen: soft, nontender, + bowel sounds Extremities: no edema, no joint deformities Skin: no rashes, warm and dry Neurological: Weakness, aphasia   IMPRESSION:  Mr. Salamone comes in for follow-up with Palliative  after initial visit during recent hospitalization. He is supported by team member from [place]. I introduced myself, Jarrett Soho, RN and the role of palliative in collaboration with his oncology medical team here at the cancer  center.   Dason shares he is feeling much better and hopes that he is somewhat improving with a goal of returning home from [place]. Denies pain, shortness of breath, headache, or visual changes. Some weakness and aphasia noted. 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   We discussed Her current illness and what it means in the larger context of Her on-going co-morbidities. Natural disease trajectory and expectations were discussed.  Mr. Corpus shares his church member [name] [name] is his main support person and HCPOA. He has never married and has no children.   I spent time establishing a therapeutic relationship with patient and gaining his understanding of current illness and co-morbidities. Gwynn is appreciative of the support and shares his main goal is to continue treating the treatable to allow himself an opportunity to return home and thrive for as long as possible.   I discussed the importance of continued conversation with family and their medical providers regarding overall plan of care and treatment options, ensuring decisions are within the context of the patients values and GOCs. I do think ongoing goals of care discussions will be important given the extent of his metastatic cancer.   PLAN: Established relationship during visit today Recommend ongoing support and goals of care discussions to include Peggy his HCPOA.  Patient is clear in expressed wishes to continue with treatment allowing him every opportunity to thrive for as long as he can. Goal is to return home with church and family support.  I will plan to see him back in collaboration with future oncology appointment.   Patient expressed understanding and was in agreement with this plan. He also understands that He can call the clinic at any time with any questions, concerns, or complaints.   Time Total: 40 min.   Visit consisted of counseling and education dealing with  the complex and emotionally intense issues of symptom management and palliative care in the setting of serious and potentially life-threatening illness.Greater than 50%  of this time was spent counseling and coordinating care related to the above assessment and plan.  Signed by: Alda Lea, AGPCNP-BC Palliative Medicine Team

## 2021-04-30 NOTE — Progress Notes (Signed)
[facility] [place], [place] [address] [phone]   New Patient Evaluation  Date of Service: 04/30/21 Patient Name: [name] [name] Patient MRN: [id number] Patient DOB: 1955-07-05 Provider: Ventura Sellers, MD  Identifying Statement:  [name] [name] is a 65 y.o. male with Brain metastasis [facility])  Status 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 [facility]) who presents for initial consultation and evaluation regarding cancer associated neurologic deficits.    Referring Provider: [name], NP [address] [place],  [place] [address]  Primary Cancer:  Oncologic History: Oncology History  Malignant neoplasm of bronchus of right upper lobe ([place])  01/03/2020 Initial Diagnosis   Adenocarcinoma of right lung, stage 3 ([place])   01/17/2020 - 02/28/2020 Chemotherapy   The patient had palonosetron (ALOXI) injection 0.25 mg, 0.25 mg, Intravenous,  Once, 7 of 7 cycles Administration: 0.25 mg (01/17/2020), 0.25 mg (02/14/2020), 0.25 mg (02/21/2020), 0.25 mg (01/24/2020), 0.25 mg (02/28/2020), 0.25 mg (01/31/2020), 0.25 mg (02/07/2020) CARBOplatin (PARAPLATIN) 190 mg in sodium chloride 0.9 % 250 mL chemo infusion, 190 mg (100 % of original dose 189.8 mg), Intravenous,  Once, 7 of 7 cycles Dose modification: 189.8 mg (original dose 189.8 mg, Cycle 1) Administration: 190 mg (01/17/2020), 190 mg (02/14/2020), 190 mg (02/21/2020), 190 mg (01/24/2020), 190 mg (02/28/2020), 190 mg (01/31/2020), 190 mg (02/07/2020) PACLitaxel (TAXOL) 66 mg in sodium chloride 0.9 % 150 mL chemo infusion (</= 80mg /m2), 45 mg/m2 = 66 mg, Intravenous,  Once, 7 of 7 cycles Administration: 66 mg (01/17/2020), 66 mg (02/14/2020), 66 mg (02/21/2020), 66 mg (01/24/2020), 66 mg (02/28/2020), 66 mg (01/31/2020), 66 mg (02/07/2020)   for chemotherapy treatment.     04/09/2020 - 07/30/2020 Chemotherapy          09/28/2020 Cancer Staging   Staging form: Lung, AJCC 8th Edition -  Clinical: Stage IVA (cTX, cN2, cM1b) - Signed by Curt Bears, MD on 09/28/2020    10/08/2020 -  Chemotherapy   Patient is on Treatment Plan : LUNG CARBOplatin / Pemetrexed / Pembrolizumab q21d Induction x 4 cycles / Maintenance Pemetrexed + Pembrolizumab      CNS Oncologic History 08/30/20: Pre-Op SRS 3.2 cm target in the left parietal 18 Gy (MM) 08/31/20: Resection with Dr. Kathyrn Sheriff 03/26/21: Salvage SRS left parietal 1.1 cm target (MM)  History of Present Illness: The patient's records from the referring physician were obtained and reviewed and the patient interviewed to confirm this HPI.  [name] [name] presents for evaluation following recent hospitalization for status epilepticus.  He was discharged with post-ictal encephalopathy to nursing/rehab facility.  Over the past week he continues to improve with regards to cognition, memory, engagement.  He is doing some walking at the center.  Has been dosing Keppra 1000mg  BID and Decadron 4mg  BID without issue.  Has upcoming appointment with palliative care later today.  Medications: Current Outpatient Medications on File Prior to Visit  Medication Sig Dispense Refill   acetaminophen (TYLENOL) 500 MG tablet Take 500 mg by mouth every 6 (six) hours as needed for mild pain or moderate pain.     budesonide-formoterol (SYMBICORT) 80-4.5 MCG/ACT inhaler Inhale 2 puffs into the lungs 2 (two) times daily. 1 each 11   clonazePAM (KLONOPIN) 0.25 MG disintegrating tablet Take 1 tablet (0.25 mg total) by mouth 2 (two) times daily. 30 tablet 0   dexamethasone (DECADRON) 4 MG tablet Take 4mg  three times daily for 5 days, then 4mg  twice daily.     feeding supplement (ENSURE  ENLIVE / ENSURE PLUS) LIQD Take 237 mLs by mouth 2 (two) times daily between meals. 546 mL 12   folic acid (FOLVITE) 1 MG tablet Take 1 tablet (1 mg total) by mouth daily. 30 tablet 4   ipratropium-albuterol (DUONEB) 0.5-2.5 (3) MG/3ML SOLN Take 3 mLs by nebulization every 4 (four)  hours as needed. 360 mL    levETIRAcetam (KEPPRA) 1000 MG tablet Take 1 tablet (1,000 mg total) by mouth 2 (two) times daily.     lidocaine-prilocaine (EMLA) cream Apply 1 application topically as needed. 30 g 1   Multiple Vitamin (MULTIVITAMIN) tablet Take 1 tablet by mouth daily.     pantoprazole (PROTONIX) 40 MG tablet Take 1 tablet (40 mg total) by mouth daily with supper. 30 tablet 2   polyethylene glycol (MIRALAX / GLYCOLAX) 17 g packet Take 17 g by mouth daily. 14 each 0   prochlorperazine (COMPAZINE) 10 MG tablet Take 1 tablet (10 mg total) by mouth every 6 (six) hours as needed for nausea or vomiting. 30 tablet 0   senna (SENOKOT) 8.6 MG TABS tablet Take 1 tablet (8.6 mg total) by mouth 2 (two) times daily. 120 tablet 0   tiotropium (SPIRIVA HANDIHALER) 18 MCG inhalation capsule Place 1 capsule into inhaler and inhale daily. 30 capsule 1   VENTOLIN HFA 108 (90 Base) MCG/ACT inhaler Inhale 2 puffs into the lungs every 4 (four) hours as needed for wheezing or shortness of breath.     No current facility-administered medications on file prior to visit.    Allergies:  Allergies  Allergen Reactions   Other Itching and Other (See Comments)    Seasonal allergies- Itchy eyes, runny nose, sneezing, scratchy throat   Past Medical History:  Past Medical History:  Diagnosis Date   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[place]) 12/25/2019   Cancer ([place])    COPD (chronic obstructive pulmonary disease) ([place]) 11/02/2007   Qualifier: Diagnosis of  By: Melvyn Novas MD, Christena Deem    Hypertension 12/24/2019   Iron deficiency anemia 08/23/2007   Qualifier: Diagnosis of  By: Jim Like     Past Surgical History:  Past Surgical History:  Procedure Laterality Date   APPLICATION OF CRANIAL NAVIGATION N/A 08/30/2020   Procedure: APPLICATION OF CRANIAL NAVIGATION;  Surgeon: Consuella Lose, MD;  Location: [place];  Service: Neurosurgery;  Laterality: N/A;   CRANIOTOMY Left 08/30/2020   Procedure: Stereotactic left  frontoparietal craniectomy for resection of tumor with brainlab;  Surgeon: Consuella Lose, MD;  Location: [facility];  Service: Neurosurgery;  Laterality: Left;   ENDOBRONCHIAL ULTRASOUND N/A 12/27/2019   Procedure: ENDOBRONCHIAL ULTRASOUND;  Surgeon: Laurin Coder, MD;  Location: [facility];  Service: Endoscopy;  Laterality: N/A;   FINE NEEDLE ASPIRATION  12/27/2019   Procedure: FINE NEEDLE ASPIRATION;  Surgeon: Laurin Coder, MD;  Location: [facility];  Service: Endoscopy;;   IR IMAGING GUIDED PORT INSERTION  11/30/2020   VIDEO BRONCHOSCOPY N/A 12/27/2019   Procedure: VIDEO BRONCHOSCOPY WITHOUT FLUORO;  Surgeon: Laurin Coder, MD;  Location: [facility];  Service: Endoscopy;  Laterality: N/A;   Social History:  Social History   Socioeconomic History   Marital status: Single    Spouse name: Not on file   Number of children: Not on file   Years of education: Not on file   Highest education level: Not on file  Occupational History   Not on file  Tobacco Use   Smoking status: Former    Packs/day: 0.50    Types: Cigarettes  Smokeless tobacco: Never   Tobacco comments:    Pt states he stopped since he was discharged from the hospital  Vaping Use   Vaping Use: Never used  Substance and Sexual Activity   Alcohol use: Not Currently   Drug use: Never   Sexual activity: Not Currently  Other Topics Concern   Not on file  Social History Narrative   Not on file   Social Determinants of Health   Financial Resource Strain: Not on file  Food Insecurity: Not on file  Transportation Needs: Not on file  Physical Activity: Not on file  Stress: Not on file  Social Connections: Not on file  Intimate Partner Violence: Not on file   Family History:  Family History  Problem Relation Age of Onset   Hypertension Other     Review of Systems: Constitutional: Doesn't report fevers, chills or abnormal weight loss Eyes: Doesn't report blurriness of vision Ears, nose, mouth,  throat, and face: Doesn't report sore throat Respiratory: Doesn't report cough, dyspnea or wheezes Cardiovascular: Doesn't report palpitation, chest discomfort  Gastrointestinal:  Doesn't report nausea, constipation, diarrhea GU: Doesn't report incontinence Skin: Doesn't report skin rashes Neurological: Per HPI Musculoskeletal: Doesn't report joint pain Behavioral/Psych: Doesn't report anxiety  Physical Exam: Vitals:   04/30/21 1225  BP: 135/81  Pulse: 97  Resp: 17  Temp: 97.9 F (36.6 C)  SpO2: 100%   KPS: 70. General: Alert, cooperative, pleasant, in no acute distress Head: Normal EENT: No conjunctival injection or scleral icterus.  Lungs: Resp effort normal Cardiac: Regular rate Abdomen: Non-distended abdomen Skin: No rashes cyanosis or petechiae. Extremities: No clubbing or edema  Neurologic Exam: Mental Status: Awake, alert, attentive to examiner. Oriented to self and environment. Language is fluent with intact comprehension. Age advanced psychomotor slowing, impaired insight and object recall  Cranial Nerves: Visual acuity is grossly normal. Visual fields are full. Extra-ocular movements intact. No ptosis. Face is symmetric Motor: Tone and bulk are normal. Power is full in both arms and legs. Reflexes are symmetric, no pathologic reflexes present.  Sensory: Intact to light touch Gait: Dystaxic.   Labs: I have reviewed the data as listed    Component Value Date/Time   NA 134 (L) 04/14/2021 0155   NA 144 08/03/2019 1527   K 3.9 04/14/2021 0155   CL 98 04/14/2021 0155   CO2 27 04/14/2021 0155   GLUCOSE 115 (H) 04/14/2021 0155   BUN 18 04/14/2021 0155   BUN 9 08/03/2019 1527   CREATININE 0.74 04/14/2021 0155   CREATININE 0.68 03/04/2021 0916   CALCIUM 9.2 04/14/2021 0155   PROT 6.7 04/01/2021 1859   PROT 7.3 08/03/2019 1527   ALBUMIN 2.6 (L) 04/05/2021 0500   ALBUMIN 4.6 08/03/2019 1527   AST 27 04/01/2021 1859   AST 24 03/04/2021 0916   ALT 12 04/01/2021  1859   ALT 13 03/04/2021 0916   ALKPHOS 53 04/01/2021 1859   BILITOT 0.4 04/01/2021 1859   BILITOT 0.3 03/04/2021 0916   GFRNONAA >60 04/14/2021 0155   GFRNONAA >60 03/04/2021 0916   GFRAA >60 02/14/2020 0922   Lab Results  Component Value Date   WBC 10.0 04/14/2021   NEUTROABS 4.3 04/06/2021   HGB 10.2 (L) 04/14/2021   HCT 31.3 (L) 04/14/2021   MCV 80.3 04/14/2021   PLT 325 04/14/2021    Imaging:  CT ABDOMEN PELVIS WO CONTRAST  Result Date: 04/02/2021 CLINICAL DATA:  65 year old male with altered mental status and questionable pneumoperitoneum on chest x-ray. Metastatic  non-small cell lung cancer. EXAM: CT ABDOMEN AND PELVIS WITHOUT CONTRAST TECHNIQUE: Multidetector CT imaging of the abdomen and pelvis was performed following the standard protocol without IV contrast. COMPARISON:  Portable chest 0223 hours today. Restaging CT Chest, Abdomen, and Pelvis 12/07/2020. Recent CTA head and neck 1949 hours yesterday. FINDINGS: Lower chest: Trace pleural effusions are new since July. The known left lower lobe lung mass is not included. But there is new patchy, inflammatory opacity in the lateral basal segment of the right lower lobe (series 5, image 7). Small volume pericardial effusion is increased since July and might have intermediate density on series 3, image 4, but significance is unclear. Hepatobiliary: Stable noncontrast liver. Vicarious contrast excretion to the gallbladder which otherwise appears negative. Pancreas: Negative noncontrast pancreas. Spleen: Diminutive, negative. Adrenals/Urinary Tract: Adrenal glands are within normal limits. But there is abnormal pararenal space stranding and/or fluid, and asymmetric delayed nephrograms with residual renal parenchymal contrast and contrast excretion to the collecting systems. Bilateral retroperitoneal/periureteral stranding, although no hydroureter. Contrast is intermittently present in both ureters to the bladder. The bladder is diminutive  but thick-walled, and there is intermediate density filling defect within the bladder on series 7, image 50. But no perivesical stranding. No urinary calculus identified. Stomach/Bowel: Mild retained stool in the rectum but decompressed sigmoid and descending colon. Mild retained gas in the transverse colon. Decompressed right colon. No dilated small bowel. Enteric tube courses to the gastric body. Stomach and duodenum are decompressed. No free air, free fluid, or bowel mesenteric inflammation identified. Vascular/Lymphatic: Aortoiliac calcified atherosclerosis. Vascular patency is not evaluated in the absence of IV contrast. No lymphadenopathy. Reproductive: Urethral catheter in place. Other: No pelvic free fluid. Musculoskeletal: Chronic L5 pars fractures and spondylolisthesis. Chronic disc and endplate degeneration there. Stable visualized osseous structures. IMPRESSION: 1. Negative for pneumoperitoneum or bowel abnormality. 2. But positive for asymmetric delayed nephrograms (worse on the left) with abundant abnormal pararenal space stranding and/or fluid. But no hydronephrosis or obstructing lesion. Bladder decompressed by Foley catheter, although with intermediate density filling defect in the bladder lumen. Constellation suggests Acute Intrinsic Renal Failure. Query hematuria. Urinalysis recommended. 3. Positive also for patchy opacity in the lateral basal segment of the right lower lobe suspicious for Acute Bronchopneumonia. And trace pleural effusions are new since July. 4. And also new small volume pericardial effusion with possible complex fluid density. Query Pericarditis. 5. Enteric tube terminates in the gastric body. Electronically Signed   By: Genevie Ann M.D.   On: 04/02/2021 04:49   MR BRAIN W WO CONTRAST  Result Date: 04/02/2021 CLINICAL DATA:  Initial evaluation for neuro deficit, stroke suspected. History of lung cancer, status post resection. EXAM: MRI HEAD WITHOUT AND WITH CONTRAST TECHNIQUE:  Multiplanar, multiecho pulse sequences of the brain and surrounding structures were obtained without and with intravenous contrast. CONTRAST:  5.56mL GADAVIST GADOBUTROL 1 MMOL/ML IV SOLN COMPARISON:  Prior CTs from earlier the same day as well as prior CT from 03/13/2021. FINDINGS: Brain: Examination severely degraded by motion artifact. Postoperative changes from prior left parietal craniotomy for tumor resection again seen. Again seen is an enhancing mass involving the parasagittal left parietal lobe measuring 1.4 x 1.2 cm, consistent with metastasis (series 12, image 20). Surrounding vasogenic edema seen within the adjacent left parieto-occipital region. Diffuse dural thickening and enhancement about the adjacent craniotomy site favored to be postoperative in nature (series 12, image 24). Subtle increased diffusion abnormality within this region noted as well, which could reflect subtle changes of superimposed  seizure (series 3, image 39, 31). There is question of subtly increased diffusion signal abnormality within the medial left thalamus (series 3, image 27), not entirely certain on this motion degraded exam. No definite ADC correlate or abnormal enhancement. No other evidence for acute or subacute infarct elsewhere within the brain. No other acute or chronic intracranial hemorrhage. No other visible mass lesion or abnormal enhancement. No midline shift. No hydrocephalus or extra-axial fluid collection. 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 Vascular: Major intracranial vascular flow voids are grossly maintained at the skull base, better evaluated on prior CTA. Skull and upper cervical spine: Craniocervical junction within normal limits. Bone marrow signal intensity normal. No visible focal marrow replacing lesion. Prior left parietal craniotomy. No scalp soft tissue abnormality. Sinuses/Orbits: Globes and orbital soft tissues grossly within normal limits on this motion degraded  exam. Scattered mucosal thickening noted about the right maxillary sinus. Paranasal sinuses are otherwise largely clear. No mastoid effusion. Other: None. IMPRESSION: 1. Technically limited exam due to extensive motion artifact. 2. 1.4 cm metastasis involving the parasagittal left parietal lobe. Surrounding vasogenic edema without midline shift. 3. Diffuse dural thickening and enhancement about the adjacent craniotomy site, favored to be postoperative in nature, although attention at follow-up is recommended. 4. Question subtly increased diffusion signal abnormality at the left thalamus. Although this finding is not entirely certain given motion artifact on this exam, possible subtle changes of ischemia or possibly seizure could be considered. Additional mildly increased diffusion signal within the adjacent left parieto-occipital region could also reflect changes of acute seizure. Correlation with EEG recommended. 5. No other acute intracranial abnormality. Electronically Signed   By: Jeannine Boga M.D.   On: 04/02/2021 03:25   DG CHEST PORT 1 VIEW  Result Date: 04/03/2021 CLINICAL DATA:  Intubation. EXAM: PORTABLE CHEST 1 VIEW COMPARISON:  April 02, 2021. FINDINGS: The heart size and mediastinal contours are within normal limits. Endotracheal and nasogastric tubes have been removed. Right internal jugular Port-A-Cath is unchanged in position. Mild biapical scarring is noted. Stable rounded density is seen in left lower lobe. The visualized skeletal structures are unremarkable. IMPRESSION: Endotracheal and nasogastric tubes have been removed. Stable nodular density is seen in left lower lobe. Electronically Signed   By: Marijo Conception M.D.   On: 04/03/2021 16:21   DG Chest Portable 1 View  Result Date: 04/02/2021 CLINICAL DATA:  History of lung carcinoma EXAM: PORTABLE CHEST 1 VIEW COMPARISON:  04/01/2021 CT from  02/08/2021 FINDINGS: Cardiac shadow is stable. Gastric catheter is noted within  the stomach although the proximal side port lies in the distal esophagus. This should be advanced several cm deeper into the stomach. Endotracheal tube is noted in satisfactory position. Right chest wall port is noted in satisfactory position. Rounded left lower lobe mass lesion is again identified stable from prior CT. No sizable effusion is noted. No focal infiltrate is seen. Bony structures are within normal limits. IMPRESSION: Tubes and lines as described above. Gastric catheter should be advanced deeper into the stomach. Left lower lobe mass lesion stable from the prior CT. Electronically Signed   By: Inez Catalina M.D.   On: 04/02/2021 02:31   DG Chest Port 1 View  Result Date: 04/01/2021 CLINICAL DATA:  Questionable sepsis. EXAM: PORTABLE CHEST 1 VIEW COMPARISON:  Chest x-ray 08/30/2020.  CT of the chest 02/08/2021. FINDINGS: Right chest port catheter tip projects over the distal SVC. Chronic appearing scarring in the right apex and paramediastinal region appears unchanged. Bullae  are again noted in the left lung apex. Nodular densities in the left lower lung persists, grossly unchanged. Artifact overlies is region. There is no new lung infiltrate, pleural effusion or pneumothorax identified. Cardiac silhouette is within normal limits. No acute fractures are seen. Air under the right hemidiaphragm is indeterminate. IMPRESSION: 1. Air under the right hemidiaphragm is indeterminate and may be within air distended bowel. If there is high clinical concern for free air recommend dedicated abdominal series with decubitus view. 2. Stable nodular densities in the left lower lung. No new lung infiltrate. Electronically Signed   By: Ronney Asters M.D.   On: 04/01/2021 21:28   DG Abd Portable 1V  Result Date: 04/03/2021 CLINICAL DATA:  Encounter for orogastric tube placement. History of metastatic lung cancer. EXAM: PORTABLE ABDOMEN - 1 VIEW COMPARISON:  04/02/2021 and chest CT 02/08/2021 FINDINGS: Endotracheal  tube is approximately 1.2 cm above the carina. The OG tube is in the left upper abdomen near the gastric fundus. Again noted is a right jugular Port-A-Cath with the tip in the lower SVC region. Heart size is within normal limits and stable. Again noted is a large nodule in the left lower lobe. Few patchy densities in the right lower lung are unchanged from the recent comparison examination. Negative for pneumothorax. IMPRESSION: 1. OG tube tip in the gastric fundus region. 2.  Endotracheal tube is appropriately positioned. 3. No significant change in the lung densities. Electronically Signed   By: Markus Daft M.D.   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 EEG to evaluate for seizure Level of alertness:  lethargic AEDs during EEG study: LEV Technical aspects: This EEG study was done with scalp electrodes positioned according to the 10-20 International system of electrode placement. Electrical activity was acquired at a sampling rate of 500Hz  and reviewed with a high frequency filter of 70Hz  and a low frequency filter of 1Hz . EEG data were recorded continuously and digitally stored. Description: EEG showed continuous generalized and maximal left posterior temporal region 3 to 6 Hz theta-delta slowing with overriding 15 to 18 Hz generalized beta activity. Lateralized paretic discharges with overriding fast activity were also noted in left posterior temporal region with fluctuating frequency between 0.5 to 1 Hz which at times appeared rhythmic without definite evolution consistent with brief ictal-interictal rhythmic discharges.  Hyperventilation and  photic stimulation were not performed.   ABNORMALITY -Periodic discharges with overriding fast activity, left hemisphere, maximal left posterior temporal region ( LPD+) -Brief ictal-interictal rhythmic discharges, left hemisphere, maximal left posterior temporal region -Continuous slow, generalized and maximal left posterior temporal region IMPRESSION: This study showed evidence of epileptogenicity and cortical dysfunction arising from left hemisphere, maximal left posterior temporal region which is on the ictal-interictal continuum with high potential for seizure recurrence.  Additionally there is moderate diffuse encephalopathy, nonspecific etiology.  No definite seizures were seen. Lora Havens   ECHOCARDIOGRAM COMPLETE  Result Date: 04/02/2021    ECHOCARDIOGRAM REPORT   Patient Name:   [name] [name] Date of Exam: 04/02/2021 Medical Rec #:  [id number]       Height:       60.0 in Accession #:  [id number]      Weight:       122.6 lb Date of Birth:  October 31, 1955      BSA:          1.516 m Patient Age:    65 years        BP:           107/76 mmHg Patient Gender: M               HR:           117 bpm. Exam Location:  Inpatient Procedure: 2D Echo, Cardiac Doppler and Color Doppler Indications:    Pericardial Effusion  History:        Patient has no prior history of Echocardiogram examinations.                 Risk Factors:Hypertension.  Sonographer:    Helmut Muster Referring Phys: [id number] PAULA B SIMPSON  Sonographer Comments: Image acquisition challenging due to patient behavioral factors. and Image acquisition challenging due to patient body habitus. IMPRESSIONS  1. Moderate pericardial effusion that is apical and anterior to the RV. Largest at 17 mm. There is no RV or RA collapse. IVC not well visualized. There is no mitral or tricuspid respirophasic variation in spectral Doppler signal- a marker for increased pericardial pressure.  2. Left ventricular ejection fraction, by estimation, is 60%. The left  ventricle has normal function. The left ventricle has no regional wall motion abnormalities. Indeterminate diastolic filling due to E-A fusion.  3. Right ventricular systolic function is normal. The right ventricular size is normal. Tricuspid regurgitation signal is inadequate for assessing PA pressure.  4. The mitral valve is normal in structure. No evidence of mitral valve regurgitation. No evidence of mitral stenosis.  5. The aortic valve is tricuspid. There is mild thickening of the aortic valve. Aortic valve regurgitation is not visualized. Comparison(s): No prior Echocardiogram. FINDINGS  Left Ventricle: Left ventricular ejection fraction, by estimation, is 60%. The left ventricle has normal function. The left ventricle has no regional wall motion abnormalities. The left ventricular internal cavity size was normal in size. There is no left ventricular hypertrophy. Indeterminate diastolic filling due to E-A fusion. Right Ventricle: The right ventricular size is normal. No increase in right ventricular wall thickness. Right ventricular systolic function is normal. Tricuspid regurgitation signal is inadequate for assessing PA pressure. Left Atrium: Left atrial size was normal in size. Right Atrium: Right atrial size was normal in size. Pericardium: A moderately sized pericardial effusion is present. The pericardial effusion is surrounding the apex. Mitral Valve: The mitral valve is normal in structure. No evidence of mitral valve regurgitation. No evidence of mitral valve stenosis. Tricuspid Valve: The tricuspid valve is normal in structure. Tricuspid valve regurgitation is not demonstrated. No evidence of tricuspid stenosis. Aortic Valve: The aortic valve is tricuspid. There is mild thickening of the aortic valve. Aortic valve regurgitation is not visualized. Pulmonic Valve: The pulmonic valve was not well visualized. Pulmonic valve regurgitation is not visualized. No evidence of pulmonic stenosis. Aorta: The  aortic root is normal in size and structure. IAS/Shunts: The atrial septum is grossly normal.  LEFT VENTRICLE PLAX 2D LVIDd:         4.50 cm     Diastology LVIDs:         3.30 cm     LV e' medial:   4.70 cm/s LV PW:         0.80 cm  LV E/e' medial: 15.1 LV IVS:        0.80 cm LVOT diam:     2.20 cm LV SV:         71 LV SV Index:   47 LVOT Area:     3.80 cm  LV Volumes (MOD) LV vol d, MOD A2C: 51.0 ml LV vol d, MOD A4C: 53.5 ml LV vol s, MOD A2C: 19.8 ml LV vol s, MOD A4C: 19.3 ml LV SV MOD A2C:     31.2 ml LV SV MOD A4C:     53.5 ml LV SV MOD BP:      34.3 ml RIGHT VENTRICLE RV S prime:     17.40 cm/s TAPSE (M-mode): 2.6 cm LEFT ATRIUM             Index       RIGHT ATRIUM          Index LA diam:        2.70 cm 1.78 cm/m  RA Area:     7.97 cm LA Vol (A2C):   13.2 ml 8.71 ml/m  RA Volume:   12.90 ml 8.51 ml/m LA Vol (A4C):   14.0 ml 9.23 ml/m LA Biplane Vol: 14.3 ml 9.43 ml/m  AORTIC VALVE LVOT Vmax:   123.00 cm/s LVOT Vmean:  69.000 cm/s LVOT VTI:    0.186 m  AORTA Ao Root diam: 3.30 cm Ao Asc diam:  3.10 cm MITRAL VALVE MV Area (PHT): 8.92 cm     SHUNTS MV Decel Time: 85 msec      Systemic VTI:  0.19 m MV E velocity: 71.10 cm/s   Systemic Diam: 2.20 cm MV A velocity: 117.00 cm/s MV E/A ratio:  0.61 Rudean Haskell MD Electronically signed by Rudean Haskell MD Signature Date/Time: 04/02/2021/4:15:24 PM    Final    CT HEAD CODE STROKE WO CONTRAST  Result Date: 04/01/2021 CLINICAL DATA:  Code stroke. EXAM: CT HEAD WITHOUT CONTRAST TECHNIQUE: Contiguous axial images were obtained from the base of the skull through the vertex without intravenous contrast. COMPARISON:  03/13/2021. FINDINGS: Brain: Large area of hypodensity involving the left parietal and occipital lobe; this area previously contained an enhancing lesion and was the subject of recent SRS. No acute hemorrhage, mass effect, or midline shift. No hydrocephalus or extra-axial collection. Vascular: No hyperdense vessel or unexpected  calcification. Skull: Status post right frontotemporal and left parietal craniotomies. No acute osseous abnormality. Sinuses/Orbits: Mucosal thickening in the right maxillary sinus. The orbits are unremarkable. Other: Fluid in the right mastoid air cells. ASPECTS [facility] Stroke Program Early CT Score) - Ganglionic level infarction (caudate, lentiform nuclei, internal capsule, insula, M1-M3 cortex): 7 - Supraganglionic infarction (M4-M6 cortex): 3 Total score (0-10 with 10 being normal): 10 IMPRESSION: 1. Hypodensity in the left parietal and occipital lobes, which is new from the prior exam but which correlates with an area of recent radiation therapy. This may represent acute infarct or edema related to recent SRS. 2. ASPECTS is 10 Code stroke imaging results were communicated on 04/01/2021 at 7:46 pm to provider Dr. Cheral Marker via secure text paging. Electronically Signed   By: Merilyn Baba M.D.   On: 04/01/2021 19:48   CT ANGIO HEAD NECK W WO CM W PERF (CODE STROKE)  Result Date: 04/01/2021 CLINICAL DATA:  Initial evaluation for neuro deficit, stroke suspected, aphasia. EXAM: CT ANGIOGRAPHY HEAD AND NECK CT PERFUSION BRAIN TECHNIQUE: Multidetector CT imaging of the head and neck was performed using the standard protocol  during bolus administration of intravenous contrast. Multiplanar CT image reconstructions and MIPs were obtained to evaluate the vascular anatomy. Carotid stenosis measurements (when applicable) are obtained utilizing NASCET criteria, using the distal internal carotid diameter as the denominator. Multiphase CT imaging of the brain was performed following IV bolus contrast injection. Subsequent parametric perfusion maps were calculated using RAPID software. CONTRAST:  46mL OMNIPAQUE IOHEXOL 350 MG/ML SOLN COMPARISON:  Prior CT from earlier the same day. FINDINGS: CTA NECK FINDINGS Aortic arch: Visualized aortic arch normal caliber with normal 3 vessel morphology. No stenosis about the origin of  the great vessels. Right carotid system: Right common and internal carotid arteries widely patent without stenosis, dissection or occlusion. Left carotid system: Left common and internal carotid arteries widely patent without stenosis, dissection or occlusion. Vertebral arteries: Both vertebral arteries arise from the subclavian arteries. No proximal subclavian artery stenosis. Both vertebral arteries widely patent without stenosis, dissection or occlusion. Skeleton: No discrete or worrisome osseous lesions. C3 and C4 vertebral bodies are fused. Other neck: Asymmetric thickening and enhancement seen about the right masseter muscle (series 5, image 88), nonspecific, but could reflect post radiation changes and/or myositis, which could be either infectious or inflammatory in nature. Adjacent right parotid gland and submandibular glands within normal limits. No other acute abnormality within the neck. Upper chest: Abnormal pleuroparenchymal thickening at the medial right upper lobe/right lung apex, likely reflecting post treatment changes. Severe bolus emphysematous changes within the visualized left lung. 12 mm left upper lobe nodule partially visualized (series 5, image 169). Right-sided Port-A-Cath noted. Review of the MIP images confirms the above findings CTA HEAD FINDINGS Anterior circulation: Both internal carotid arteries widely patent to the termini without stenosis. 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 No residual aneurysm visualized. A1 segments patent bilaterally. Left A1 hypoplastic. Normal anterior communicating artery complex. Anterior cerebral arteries patent to their distal aspects without stenosis. No M1 stenosis or occlusion. Normal MCA bifurcations. Distal MCA branches perfused and symmetric. Posterior circulation: Both V4 segments patent to the vertebrobasilar junction without stenosis. Right PICA patent. Left PICA not well seen. Basilar patent to its  distal aspect without stenosis. Superior cerebral arteries patent bilaterally. Both PCAs primarily supplied via the basilar well perfused or distal aspects. Venous sinuses: Grossly patent a large for timing the contrast bolus. Anatomic variants: None significant. Asymmetric left a meningeal enhancement at the left parietal region related to vasogenic edema and known metastasis at this location. Review of the MIP images confirms the above findings CT Brain Perfusion Findings: ASPECTS: 10. CBF (<30%) Volume: 64mL Perfusion (Tmax>6.0s) volume: 48mL Mismatch Volume: 42mL Infarction Location:Negative CT perfusion with no evidence for acute ischemia. Increased perfusion is seen within the left parietal region on dedicated perfusion maps, presumably related to vasogenic edema and known metastatic lesion at this location. Changes of acute seizure may be contributory. IMPRESSION: 1. Negative CTA for emergent large vessel occlusion. 2. Negative CT perfusion with no evidence for acute ischemia. 3. Mild for age atheromatous disease. No hemodynamically significant or correctable stenosis about the major arterial vasculature of the head and neck. 4. Increased perfusion with leptomeningeal enhancement about the left parietal region, presumably related to vasogenic edema and known metastasis at this location. Superimposed changes of acute seizure could also be considered. 5. 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 No residual aneurysm visualized. 6. Presumed post treatment changes at the right lung apex, with persistent 12 mm left upper lobe nodule.  7. Asymmetric thickening and enhancement about the right masseter muscle, nonspecific, but suspected to reflect post radiation changes. Acute myositis, which could be either infectious or inflammatory, would be the primary differential consideration. 8. Emphysema (ICD10-J43.9). Results discussed by telephone at the time of interpretation on  04/01/2021 at approximately 8:15 p.m. to provider ERIC [facility] , who verbally acknowledged these results. Electronically Signed   By: Jeannine Boga M.D.   On: 04/01/2021 21:07   Rapid EEG  Result Date: 04/02/2021 Lora Havens, MD     04/02/2021 11:49 AM Patient Name: [name] [name] MRN: [id number] Epilepsy Attending: Lora Havens Referring Physician/Provider: Dr. 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  Rapid EEG to evaluate for seizure. Level of alertness: awake AEDs during EEG study: LEV Technical aspects: This EEG study was done with scalp electrodes positioned according to the 10-20 International system of electrode placement. Electrical activity was acquired at a sampling rate of 500Hz  and reviewed with a high frequency filter of 70Hz  and a low frequency filter of 1Hz . EEG data were recorded continuously and digitally stored. Description: EEG showed continuous generalized 2 to 3 Hz delta slowing with overriding 15-18hz  generalized beta activity. Hyperventilation and photic stimulation were not performed.   Of note, study was technically difficult due to electrode artifact. ABNORMALITY - Continuous slow, generalized - Excessive beta, generalized IMPRESSION: This limited ceribell EEG is suggestive of severe diffuse encephalopathy, nonspecific etiology but could be secondary to medications like benzodiazepines.  No seizures or epileptiform discharges were seen throughout the recording. If suspicion for ictal-interictal activity persists, please consider conventional EEG. [facility] Clinician Interpretation: I have personally reviewed the radiological images as listed.  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  Etiology was likely post-SRS inflammation within left parietal lobe.  We recommended continuing Keppra 1000mg  BID.    Decadron should decrease to 4mg  daily x14 days, then down to 2mg  daily thereafter if tolerated.  We ask that [name] [name] return to clinic in 2 months following next brain MRI, or sooner as needed.  We encouraged him to set up follow up appointment with Dr. Julien Nordmann as well.  We spent twenty additional minutes teaching regarding the natural history, biology, and historical experience in the treatment of neurologic complications of cancer.   We appreciate the opportunity to participate in the care of [name] [name].   All questions were answered. The patient knows to call the clinic with any problems, questions or concerns. No barriers to learning were detected.  The total time spent in the encounter was 40 minutes and more than 50% was on counseling and review of test results   Ventura Sellers, MD Medical Director of Neuro-Oncology [facility] [place] 04/30/21 12:58 PM

## 2021-05-02 ENCOUNTER — Ambulatory Visit (INDEPENDENT_AMBULATORY_CARE_PROVIDER_SITE_OTHER): Payer: Medicaid Other | Admitting: Primary Care

## 2021-05-04 ENCOUNTER — Other Ambulatory Visit: Payer: Self-pay

## 2021-05-15 ENCOUNTER — Encounter: Payer: Self-pay | Admitting: Neurology

## 2021-05-15 ENCOUNTER — Ambulatory Visit (INDEPENDENT_AMBULATORY_CARE_PROVIDER_SITE_OTHER): Payer: Medicaid Other | Admitting: Neurology

## 2021-05-15 VITALS — BP 114/72 | HR 106 | Ht 60.0 in | Wt 126.0 lb

## 2021-05-15 DIAGNOSIS — G40909 Epilepsy, unspecified, not intractable, without status epilepticus: Secondary | ICD-10-CM

## 2021-05-15 DIAGNOSIS — G936 Cerebral edema: Secondary | ICD-10-CM | POA: Diagnosis not present

## 2021-05-15 DIAGNOSIS — C7931 Secondary malignant neoplasm of brain: Secondary | ICD-10-CM | POA: Diagnosis not present

## 2021-05-15 MED ORDER — LEVETIRACETAM 1000 MG PO TABS: 1000.0000 mg | ORAL_TABLET | Freq: Two times a day (BID) | ORAL | 4 refills | Status: DC

## 2021-05-15 NOTE — Progress Notes (Signed)
[facility]  PATIENT: [name] [name] DOB: 1955-09-04  REQUESTING CLINICIAN: Bonnielee Haff, MD HISTORY FROM: Patient and [name] Dago, CNA from the nursing home, and [name] [name] (POA) REASON FOR VISIT: New onset seizure   HISTORICAL  CHIEF COMPLAINT:  Chief Complaint  Patient presents with   New Patient (Initial Visit)    Rm 14. Accompanied by CNA from nursing home. NP/ED referral for seizures.     HISTORY OF PRESENT ILLNESS:  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 Patient remembered going out to take out his trash and next thing that he noted was waking up in the hospital. In the ED, he was initially noted to be aphasic, his initial stroke work-up was negative then he started developing seizure-like activity.  Patient was noted later to vomit, develop aspiration and respiratory failure and was subsequently intubated.  His continuous EEG did not show any active seizure but show left posterior temporal ictal interictal continuum and LPDs concerning for seizure recurrence.  He was loaded with Keppra and started on Keppra 1000 mg twice daily.  Since discharge from the hospital, he is a resident of the [facility] home.  He is compliant with medication and is currently getting therapy there.  Denies any seizure or seizure-like activity. Denies any previous seizures. Per his power of attorney piggyback, patient was independent prior to his admission.  Previously he lives alone in his apartment was able to maintain all activities of daily living.   Handedness: Right   Seizure Type: Non convulsive,  aphasia  Current frequency: Once but was in status epilepticus  Any injuries from seizures: None  Seizure risk factors: Adenocarcinoma of the lungs with brain mets  Previous ASMs: None   Currenty ASMs: Levetiracetam   ASMs side effects: None   Brain Images: Mets to the left temporo-parietal region  Previous EEGs: left temp LPD+, brief ictal-interictal rhythmic discharges, left posterior temporal region   OTHER MEDICAL CONDITIONS: [facility] with brain mets, COPD, HTN  REVIEW OF SYSTEMS: Full 14 system review of systems performed and negative with exception of: as noted in the HPI  ALLERGIES: Allergies  Allergen Reactions   Other Itching and Other (See Comments)    Seasonal allergies- Itchy eyes, runny nose, sneezing, scratchy throat    HOME MEDICATIONS: Outpatient Medications Prior to Visit  Medication Sig Dispense Refill   acetaminophen (TYLENOL) 500 MG tablet Take 500 mg by mouth every 6 (six) hours as needed for mild pain or moderate pain.     budesonide-formoterol (SYMBICORT) 80-4.5 MCG/ACT inhaler Inhale 2 puffs into the lungs 2 (two) times daily. 1 each 11   clonazePAM (KLONOPIN) 0.25 MG disintegrating tablet Take 1 tablet (0.25 mg total) by mouth 2 (two) times daily. 30 tablet 0   dexamethasone (DECADRON) 4 MG tablet Take 4mg  three times daily for 5 days, then 4mg  twice daily.     feeding supplement (ENSURE ENLIVE / ENSURE PLUS) LIQD Take 237 mLs by mouth 2 (two) times daily between meals. 854 mL 12   folic acid (FOLVITE) 1 MG tablet Take 1 tablet (1 mg total) by mouth daily. 30 tablet 4   INCRUSE ELLIPTA 62.5 MCG/ACT AEPB  Inhale 1 puff into the lungs daily.     ipratropium-albuterol (DUONEB) 0.5-2.5 (3) MG/3ML SOLN Take 3 mLs by nebulization every 4 (four) hours as needed. 360 mL    lidocaine-prilocaine (EMLA) cream Apply 1 application topically as needed. 30 g 1   Multiple Vitamin (MULTIVITAMIN) tablet Take 1 tablet by mouth daily.     nicotine polacrilex (COMMIT) 2 MG  lozenge Take 2 mg by mouth as needed.     pantoprazole (PROTONIX) 40 MG tablet Take 1 tablet (40 mg total) by mouth daily with supper. 30 tablet 2   polyethylene glycol (MIRALAX / GLYCOLAX) 17 g packet Take 17 g by mouth daily. 14 each 0   prochlorperazine (COMPAZINE) 10 MG tablet Take 1 tablet (10 mg total) by mouth every 6 (six) hours as needed for nausea or vomiting. 30 tablet 0   senna (SENOKOT) 8.6 MG TABS tablet Take 1 tablet (8.6 mg total) by mouth 2 (two) times daily. 120 tablet 0   tiotropium (SPIRIVA HANDIHALER) 18 MCG inhalation capsule Place 1 capsule into inhaler and inhale daily. 30 capsule 1   VENTOLIN HFA 108 (90 Base) MCG/ACT inhaler Inhale 2 puffs into the lungs every 4 (four) hours as needed for wheezing or shortness of breath.     levETIRAcetam (KEPPRA) 1000 MG tablet Take 1 tablet (1,000 mg total) by mouth 2 (two) times daily.     No facility-administered medications prior to visit.    PAST MEDICAL HISTORY: Past Medical History:  Diagnosis Date   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[place]) 12/25/2019   Cancer ([place])    COPD (chronic obstructive pulmonary disease) ([place]) 11/02/2007   Qualifier: Diagnosis of  By: Melvyn Novas MD, Christena Deem    Hypertension 12/24/2019   Iron deficiency anemia 08/23/2007   Qualifier: Diagnosis of  By: Jim Like      PAST SURGICAL HISTORY: Past Surgical History:  Procedure Laterality Date   APPLICATION OF CRANIAL NAVIGATION N/A 08/30/2020   Procedure: APPLICATION OF CRANIAL NAVIGATION;  Surgeon: Consuella Lose, MD;  Location: [place];  Service: Neurosurgery;  Laterality: N/A;   CRANIOTOMY Left 08/30/2020   Procedure: Stereotactic left frontoparietal craniectomy for resection of tumor with brainlab;  Surgeon: Consuella Lose, MD;  Location: [place];  Service: Neurosurgery;  Laterality: Left;   ENDOBRONCHIAL ULTRASOUND N/A 12/27/2019   Procedure: ENDOBRONCHIAL ULTRASOUND;  Surgeon: Laurin Coder, MD;  Location: [facility];  Service: Endoscopy;  Laterality:  N/A;   FINE NEEDLE ASPIRATION  12/27/2019   Procedure: FINE NEEDLE ASPIRATION;  Surgeon: Laurin Coder, MD;  Location: [facility];  Service: Endoscopy;;   IR IMAGING GUIDED PORT INSERTION  11/30/2020   VIDEO BRONCHOSCOPY N/A 12/27/2019   Procedure: VIDEO BRONCHOSCOPY WITHOUT FLUORO;  Surgeon: Laurin Coder, MD;  Location: [facility];  Service: Endoscopy;  Laterality: N/A;    FAMILY HISTORY: Family History  Problem Relation Age of Onset   Hypertension Other     SOCIAL HISTORY: Social History   Socioeconomic History   Marital status: Single    Spouse name: Not on file   Number of children: Not on file   Years of education: Not on file   Highest education level: Not on file  Occupational History   Not on file  Tobacco Use   Smoking status: Former    Packs/day: 0.50    Types: Cigarettes   Smokeless tobacco: Never   Tobacco comments:    Pt states he stopped since he was discharged from the hospital  Vaping Use   Vaping  Use: Never used  Substance and Sexual Activity   Alcohol use: Not Currently   Drug use: Never   Sexual activity: Not Currently  Other Topics Concern   Not on file  Social History Narrative   Not on file   Social Determinants of Health   Financial Resource Strain: Not on file  Food Insecurity: Not on file  Transportation Needs: Not on file  Physical Activity: Not on file  Stress: Not on file  Social Connections: Not on file  Intimate Partner Violence: Not on file     PHYSICAL EXAM  GENERAL EXAM/CONSTITUTIONAL: Vitals:  Vitals:   05/15/21 0917  BP: 114/72  Pulse: (!) 106  Weight: 126 lb (57.2 kg)  Height: 5' (1.524 m)   Body mass index is 24.61 kg/m. Wt Readings from Last 3 Encounters:  05/15/21 126 lb (57.2 kg)  04/30/21 126 lb (57.2 kg)  04/15/21 132 lb 0.9 oz (59.9 kg)   Patient is in no distress; well developed, nourished and groomed; neck is supple, sitting in his wheelchair  CARDIOVASCULAR: Examination of carotid  arteries is normal; no carotid bruits Regular rate and rhythm, no murmurs Examination of peripheral vascular system by observation and palpation is normal  EYES: Pupils round and reactive to light, Visual fields full to confrontation, Extraocular movements intacts,  No results found.  MUSCULOSKELETAL: Gait, strength, tone, movements noted in Neurologic exam below  NEUROLOGIC: MENTAL STATUS:  No flowsheet data found. awake, alert, oriented to person, difficulty with date and time Difficulty with recent memory of his admission  Unable to calculate the number of quarter in $1,75 language fluent, comprehension intact, naming intact  CRANIAL NERVE:  2nd, 3rd, 4th, 6th - pupils equal and reactive to light, visual fields full to confrontation, extraocular muscles intact, no nystagmus 5th - facial sensation symmetric 7th - facial strength symmetric 8th - hearing intact 9th - palate elevates symmetrically, uvula midline 11th - shoulder shrug symmetric 12th - tongue protrusion midline  MOTOR:  Decrease bulk and normal tone, Decrease strength in the RLE 3-4/5 when compared to the left   SENSORY:  Decrease sensation to light touch and vibration to right upper and lower extremities   REFLEXES:  deep tendon reflexes present and symmetric  GAIT/STATION:  Deferred      DIAGNOSTIC DATA (LABS, IMAGING, TESTING) - I reviewed patient records, labs, notes, testing and imaging myself where available.  Lab Results  Component Value Date   WBC 10.0 04/14/2021   HGB 10.2 (L) 04/14/2021   HCT 31.3 (L) 04/14/2021   MCV 80.3 04/14/2021   PLT 325 04/14/2021      Component Value Date/Time   NA 134 (L) 04/14/2021 0155   NA 144 08/03/2019 1527   K 3.9 04/14/2021 0155   CL 98 04/14/2021 0155   CO2 27 04/14/2021 0155   GLUCOSE 115 (H) 04/14/2021 0155   BUN 18 04/14/2021 0155   BUN 9 08/03/2019 1527   CREATININE 0.74 04/14/2021 0155   CREATININE 0.68 03/04/2021 0916   CALCIUM 9.2  04/14/2021 0155   PROT 6.7 04/01/2021 1859   PROT 7.3 08/03/2019 1527   ALBUMIN 2.6 (L) 04/05/2021 0500   ALBUMIN 4.6 08/03/2019 1527   AST 27 04/01/2021 1859   AST 24 03/04/2021 0916   ALT 12 04/01/2021 1859   ALT 13 03/04/2021 0916   ALKPHOS 53 04/01/2021 1859   BILITOT 0.4 04/01/2021 1859   BILITOT 0.3 03/04/2021 0916   GFRNONAA >60 04/14/2021 0155   GFRNONAA >60 03/04/2021  3785   GFRAA >60 02/14/2020 0922   Lab Results  Component Value Date   CHOL 195 08/03/2019   HDL 65 08/03/2019   LDLCALC 109 (H) 08/03/2019   TRIG 60 04/03/2021   Lab Results  Component Value Date   HGBA1C 5.6 05/31/2018   Lab Results  Component Value Date   YIFOYDXA12 878 08/05/2007   Lab Results  Component Value Date   TSH 9.126 (H) 04/01/2021    MRI Brain 1. Technically limited exam due to extensive motion artifact. 2. 1.4 cm metastasis involving the parasagittal left parietal lobe. Surrounding vasogenic edema without midline shift. 3. Diffuse dural thickening and enhancement about the adjacent craniotomy site, favored to be postoperative in nature, although attention at follow-up is recommended. 4. Question subtly increased diffusion signal abnormality at the left thalamus. Although this finding is not entirely certain given motion artifact on this exam, possible subtle changes of ischemia or possibly seizure could be considered. Additional mildly increased diffusion signal within the adjacent left parieto-occipital region could also reflect changes of acute seizure. Correlation with EEG recommended. 5. No other acute intracranial abnormality   cEEG 11/16 -Periodic discharges with overriding fast activity, left hemisphere, maximal left posterior temporal region ( LPD+) -Brief ictal-interictal rhythmic discharges, left hemisphere, maximal left posterior temporal region -Continuous slow, generalized and maximal left posterior temporal region   IMPRESSION: This study showed evidence of  epileptogenicity and cortical dysfunction arising from left hemisphere, maximal left posterior temporal region which is on the ictal-interictal continuum with high potential for seizure recurrence.  Additionally there is moderate diffuse encephalopathy, nonspecific etiology.  No definite seizures were seen.  I personally reviewed brain Images and previous EEG reports.   ASSESSMENT AND PLAN  65 y.o. year old male  with adenocarcinoma of the lung with mets to the brain, COPD, hypertension who is presenting with new onset seizure.  Patient initially presented to the ED on the 15th and was found to be in status epilepticus.  He was initially intubated for airway protection, started on Keppra 1000 mg twice daily.  Since discharge from the hospital he has not had any additional seizures but is complaining of right lower extremity weakness and currently he is at a nursing home.  He continues to get physical therapy daily.  At this time we will continue the Keppra 1000 mg twice daily, I will obtain a Keppra level today and we will continue to monitor him.  I advised the nurse and nursing home to contact me if patient has another seizure.  Plan at that time will be to increase his Keppra.  Follow-up with your primary care doctor and oncologist return in 6 months for follow-up. In the case he goes home, will arrange for outpatient physical therapy.     1. Seizure disorder ([place])   2. Brain metastasis ([place])   3. Brain edema [facility])      Patient Instructions  Continue with Keppra 1000 mg twice daily  We will check a Keppra level today  Return in 6 months or sooner if worse    Per [facility] statutes, patients with seizures are not allowed to drive until they have been seizure-free for six months.  Other recommendations include using caution when using heavy equipment or power tools. Avoid working on ladders or at heights. Take showers instead of baths.  Do not swim alone.  Ensure the water  temperature is not too high on the home water heater. Do not go swimming alone. Do not  lock yourself in a room alone (i.e. bathroom). When caring for infants or small children, sit down when holding, feeding, or changing them to minimize risk of injury to the child in the event you have a seizure. Maintain good sleep hygiene. Avoid alcohol.  Also recommend adequate sleep, hydration, good diet and minimize stress.   During the Seizure  - First, ensure adequate ventilation and place patients on the floor on their left side  Loosen clothing around the neck and ensure the airway is patent. If the patient is clenching the teeth, do not force the mouth open with any object as this can cause severe damage - Remove all items from the surrounding that can be hazardous. The patient may be oblivious to what's happening and may not even know what he or she is doing. If the patient is confused and wandering, either gently guide him/her away and block access to outside areas - Reassure the individual and be comforting - Call 911. In most cases, the seizure ends before EMS arrives. However, there are cases when seizures may last over 3 to 5 minutes. Or the individual may have developed breathing difficulties or severe injuries. If a pregnant patient or a person with diabetes develops a seizure, it is prudent to call an ambulance. - Finally, if the patient does not regain full consciousness, then call EMS. Most patients will remain confused for about 45 to 90 minutes after a seizure, so you must use judgment in calling for help. - Avoid restraints but make sure the patient is in a bed with padded side rails - Place the individual in a lateral position with the neck slightly flexed; this will help the saliva drain from the mouth and prevent the tongue from falling backward - Remove all nearby furniture and other hazards from the area - Provide verbal assurance as the individual is regaining consciousness - Provide  the patient with privacy if possible - Call for help and start treatment as ordered by the caregiver   After the Seizure (Postictal Stage)  After a seizure, most patients experience confusion, fatigue, muscle pain and/or a headache. Thus, one should permit the individual to sleep. For the next few days, reassurance is essential. Being calm and helping reorient the person is also of importance.  Most seizures are painless and end spontaneously. Seizures are not harmful to others but can lead to complications such as stress on the lungs, brain and the heart. Individuals with prior lung problems may develop labored breathing and respiratory distress.     Orders Placed This Encounter  Procedures   Levetiracetam level    Meds ordered this encounter  Medications   levETIRAcetam (KEPPRA) 1000 MG tablet    Sig: Take 1 tablet (1,000 mg total) by mouth 2 (two) times daily.    Dispense:  180 tablet    Refill:  4    Return in about 6 months (around 11/13/2021).    Alric Ran, MD 05/15/2021, 4:45 PM  [facility] [address] [place], [place] [address] [phone]

## 2021-05-15 NOTE — Patient Instructions (Addendum)
Continue with Keppra 1000 mg twice daily  We will check a Keppra level today  Return in 6 months or sooner if worse      Per [facility] statutes, patients with seizures are not allowed to drive until they have been seizure-free for six months.  Other recommendations include using caution when using heavy equipment or power tools. Avoid working on ladders or at heights. Take showers instead of baths.  Do not swim alone.  Ensure the water temperature is not too high on the home water heater. Do not go swimming alone. Do not lock yourself in a room alone (i.e. bathroom). When caring for infants or small children, sit down when holding, feeding, or changing them to minimize risk of injury to the child in the event you have a seizure. Maintain good sleep hygiene. Avoid alcohol.  Also recommend adequate sleep, hydration, good diet and minimize stress.   During the Seizure  - First, ensure adequate ventilation and place patients on the floor on their left side  Loosen clothing around the neck and ensure the airway is patent. If the patient is clenching the teeth, do not force the mouth open with any object as this can cause severe damage - Remove all items from the surrounding that can be hazardous. The patient may be oblivious to what's happening and may not even know what he or she is doing. If the patient is confused and wandering, either gently guide him/her away and block access to outside areas - Reassure the individual and be comforting - Call 911. In most cases, the seizure ends before EMS arrives. However, there are cases when seizures may last over 3 to 5 minutes. Or the individual may have developed breathing difficulties or severe injuries. If a pregnant patient or a person with diabetes develops a seizure, it is prudent to call an ambulance. - Finally, if the patient does not regain full consciousness, then call EMS. Most patients will remain confused for about 45 to 90 minutes after  a seizure, so you must use judgment in calling for help. - Avoid restraints but make sure the patient is in a bed with padded side rails - Place the individual in a lateral position with the neck slightly flexed; this will help the saliva drain from the mouth and prevent the tongue from falling backward - Remove all nearby furniture and other hazards from the area - Provide verbal assurance as the individual is regaining consciousness - Provide the patient with privacy if possible - Call for help and start treatment as ordered by the caregiver   After the Seizure (Postictal Stage)  After a seizure, most patients experience confusion, fatigue, muscle pain and/or a headache. Thus, one should permit the individual to sleep. For the next few days, reassurance is essential. Being calm and helping reorient the person is also of importance.  Most seizures are painless and end spontaneously. Seizures are not harmful to others but can lead to complications such as stress on the lungs, brain and the heart. Individuals with prior lung problems may develop labored breathing and respiratory distress.

## 2021-05-18 LAB — LEVETIRACETAM LEVEL: Levetiracetam Lvl: 53.9 ug/mL — ABNORMAL HIGH (ref 10.0–40.0)

## 2021-05-22 ENCOUNTER — Encounter (INDEPENDENT_AMBULATORY_CARE_PROVIDER_SITE_OTHER): Payer: Self-pay | Admitting: Primary Care

## 2021-05-22 ENCOUNTER — Other Ambulatory Visit: Payer: Self-pay

## 2021-05-22 ENCOUNTER — Ambulatory Visit (INDEPENDENT_AMBULATORY_CARE_PROVIDER_SITE_OTHER): Payer: Medicaid Other | Admitting: Primary Care

## 2021-05-22 VITALS — BP 117/78 | HR 109 | Temp 97.3°F | Ht 60.0 in | Wt 130.6 lb

## 2021-05-22 DIAGNOSIS — Z029 Encounter for administrative examinations, unspecified: Secondary | ICD-10-CM

## 2021-05-22 DIAGNOSIS — Z0000 Encounter for general adult medical examination without abnormal findings: Secondary | ICD-10-CM

## 2021-05-22 NOTE — Progress Notes (Signed)
Pt resides at [facility]hab center

## 2021-05-22 NOTE — Progress Notes (Signed)
[name] [name], is a 66 y.o. male  NUU:[id number]  HKV:[id number]  DOB - Feb 13, 1956  Chief Complaint  Patient presents with   routine visit       Subjective:   [name] [name] is a 66 y.o. male here today for a follow up visit. He is presently residing in [place]. He is in a w/c and needs help with transfers and bathing but able to feed his self. Patient has No headache, No chest pain, No abdominal pain - No Nausea, No new weakness tingling or numbness, No Cough - SOB.  No problems updated.  ALLERGIES: Allergies  Allergen Reactions   Other Itching and Other (See Comments)    Seasonal allergies- Itchy eyes, runny nose, sneezing, scratchy throat    PAST MEDICAL HISTORY: Past Medical History:  Diagnosis Date   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[place]) 12/25/2019   Cancer ([place])    COPD (chronic obstructive pulmonary disease) ([place]) 11/02/2007   Qualifier: Diagnosis of  By: Melvyn Novas MD, Christena Deem    Hypertension 12/24/2019   Iron deficiency anemia 08/23/2007   Qualifier: Diagnosis of  By: Jim Like      MEDICATIONS AT HOME: Prior to Admission medications   Medication Sig Start Date End Date Taking? Authorizing Provider  acetaminophen (TYLENOL) 500 MG tablet Take 500 mg by mouth every 6 (six) hours as needed for mild pain or moderate pain.   Yes [provider]  budesonide-formoterol (SYMBICORT) 80-4.5 MCG/ACT inhaler Inhale 2 puffs into the lungs 2 (two) times daily. 04/11/21  Yes Bonnielee Haff, MD  clonazePAM (KLONOPIN) 0.25 MG disintegrating tablet Take 1 tablet (0.25 mg total) by mouth 2 (two) times daily. 04/11/21  Yes Bonnielee Haff, MD  dexamethasone (DECADRON) 4 MG tablet Take 4mg  three times daily for 5 days, then 4mg  twice daily. 04/11/21  Yes Bonnielee Haff, MD  folic acid (FOLVITE) 1 MG tablet Take 1 tablet (1 mg total) by mouth daily. 09/27/20  Yes Curt Bears, MD  INCRUSE ELLIPTA 62.5 MCG/ACT AEPB Inhale 1 puff into the lungs daily. 04/15/21  Yes  [provider]  ipratropium-albuterol (DUONEB) 0.5-2.5 (3) MG/3ML SOLN Take 3 mLs by nebulization every 4 (four) hours as needed. 04/11/21  Yes Bonnielee Haff, MD  levETIRAcetam (KEPPRA) 1000 MG tablet Take 1 tablet (1,000 mg total) by mouth 2 (two) times daily. 05/15/21 08/13/21 Yes Camara, Maryan Puls, MD  lidocaine-prilocaine (EMLA) cream Apply 1 application topically as needed. 12/17/20  Yes Curt Bears, MD  Multiple Vitamin (MULTIVITAMIN) tablet Take 1 tablet by mouth daily.   Yes [provider]  nicotine polacrilex (COMMIT) 2 MG lozenge Take 2 mg by mouth as needed. 04/18/21  Yes [provider]  pantoprazole (PROTONIX) 40 MG tablet Take 1 tablet (40 mg total) by mouth daily with supper. 01/01/21  Yes Newlin, Charlane Ferretti, MD  polyethylene glycol (MIRALAX / GLYCOLAX) 17 g packet Take 17 g by mouth daily. 04/12/21  Yes Bonnielee Haff, MD  prochlorperazine (COMPAZINE) 10 MG tablet Take 1 tablet (10 mg total) by mouth every 6 (six) hours as needed for nausea or vomiting. 09/28/20  Yes Curt Bears, MD  senna (SENOKOT) 8.6 MG TABS tablet Take 1 tablet (8.6 mg total) by mouth 2 (two) times daily. 09/20/20  Yes Samella Parr, NP  VENTOLIN HFA 108 (90 Base) MCG/ACT inhaler Inhale 2 puffs into the lungs every 4 (four) hours as needed for wheezing or shortness of breath. 12/28/19  Yes [provider]  feeding supplement (ENSURE ENLIVE / ENSURE PLUS) LIQD Take 237  mLs by mouth 2 (two) times daily between meals. 03/15/20   [name], NP  tiotropium (SPIRIVA HANDIHALER) 18 MCG inhalation capsule Place 1 capsule into inhaler and inhale daily. 12/28/19   Nicolette Bang, MD  Review of Systems  Musculoskeletal:        Unstable gait assist with transfers   Neurological:  Positive for weakness.  All other systems reviewed and are negative.   Objective:   Vitals:   05/22/21 1526  BP: 117/78  Pulse: (!) 109  Temp: (!) 97.3 F (36.3 C)  TempSrc:  Temporal  SpO2: 100%  Weight: 130 lb 9.6 oz (59.2 kg)  Height: 5' (1.524 m)   Exam General appearance : Awake, alert, not in any distress. Speech Clear. Not toxic looking HEENT: Atraumatic and Normocephalic, pupils equally reactive to light and accomodation Neck: Supple, no JVD. No cervical lymphadenopathy.  Chest: Good air entry bilaterally, no added sounds  CVS: S1 S2 regular, no murmurs.  Abdomen: Bowel sounds present, Non tender and not distended with no gaurding, rigidity or rebound. Extremities: B/L Lower Ext shows no edema, both legs are warm to touch Neurology: Awake alert, and oriented X 3, CN II-XII intact, Non focal Skin: No Rash  Data Review Lab Results  Component Value Date   HGBA1C 5.6 05/31/2018   HGBA1C  05/29/2008    5.7 (NOTE)   The ADA recommends the following therapeutic goal for glycemic   control related to Hgb A1C measurement:   Goal of Therapy:   < 7.0% Hgb A1C   Reference: American Diabetes Association: Clinical Practice   Recommendations 2008, Diabetes Care,  2008, 31:(Suppl 1).    Assessment & Plan  [name] [name] was seen today for routine visit.  Diagnoses and all orders for this visit:  Routine check-up Completed no acute findings   There are no diagnoses linked to this encounter.   Patient have been counseled extensively about nutrition and exercise. Other issues discussed during this visit include: low cholesterol diet, weight control and daily exercise, foot care, annual eye examinations at Ophthalmology, importance of adherence with medications and regular follow-up. We also discussed long term complications of uncontrolled diabetes and hypertension.   No follow-ups on file. This note has been created with Surveyor, quantity. Any transcriptional errors are unintentional.    [name],  05/22/2021, 3:46 PM

## 2021-05-27 ENCOUNTER — Other Ambulatory Visit: Payer: Self-pay

## 2021-05-27 ENCOUNTER — Inpatient Hospital Stay: Payer: Medicaid Other | Attending: Internal Medicine | Admitting: Nurse Practitioner

## 2021-05-27 ENCOUNTER — Inpatient Hospital Stay: Payer: Medicaid Other | Admitting: Nurse Practitioner

## 2021-05-27 DIAGNOSIS — C3411 Malignant neoplasm of upper lobe, right bronchus or lung: Secondary | ICD-10-CM | POA: Insufficient documentation

## 2021-05-27 DIAGNOSIS — Z515 Encounter for palliative care: Secondary | ICD-10-CM | POA: Diagnosis not present

## 2021-05-27 DIAGNOSIS — G9340 Encephalopathy, unspecified: Secondary | ICD-10-CM | POA: Insufficient documentation

## 2021-05-27 DIAGNOSIS — R531 Weakness: Secondary | ICD-10-CM | POA: Diagnosis not present

## 2021-05-27 DIAGNOSIS — R569 Unspecified convulsions: Secondary | ICD-10-CM | POA: Insufficient documentation

## 2021-05-27 DIAGNOSIS — C7931 Secondary malignant neoplasm of brain: Secondary | ICD-10-CM | POA: Diagnosis not present

## 2021-05-27 DIAGNOSIS — Z7189 Other specified counseling: Secondary | ICD-10-CM

## 2021-05-27 MED ORDER — PROCHLORPERAZINE MALEATE 10 MG PO TABS
10.0000 mg | ORAL_TABLET | ORAL | 0 refills | Status: AC
Start: 2021-05-24 — End: ?
  Filled 2021-05-27: qty 30, 7d supply, fill #0

## 2021-05-27 MED ORDER — LEVETIRACETAM 1000 MG PO TABS
1000.0000 mg | ORAL_TABLET | Freq: Two times a day (BID) | ORAL | 0 refills | Status: DC
  Filled 2021-05-27: qty 60, 30d supply, fill #0

## 2021-05-27 MED ORDER — PANTOPRAZOLE SODIUM 40 MG PO TBEC
40.0000 mg | DELAYED_RELEASE_TABLET | Freq: Every day | ORAL | 0 refills | Status: DC
  Filled 2021-05-27: qty 30, 30d supply, fill #0

## 2021-05-27 MED ORDER — UMECLIDINIUM BROMIDE 62.5 MCG/ACT IN AEPB
1.0000 | INHALATION_SPRAY | Freq: Every day | RESPIRATORY_TRACT | 0 refills | Status: DC
  Filled 2021-05-27: qty 30, 30d supply, fill #0

## 2021-05-27 MED ORDER — DEXAMETHASONE 2 MG PO TABS
2.0000 mg | ORAL_TABLET | Freq: Every day | ORAL | 0 refills | Status: DC
  Filled 2021-05-27: qty 30, 30d supply, fill #0

## 2021-05-27 MED ORDER — FOLIC ACID 1 MG PO TABS
1.0000 mg | ORAL_TABLET | Freq: Every day | ORAL | 0 refills | Status: AC
  Filled 2021-05-27: qty 30, 30d supply, fill #0

## 2021-05-27 MED ORDER — BUDESONIDE-FORMOTEROL FUMARATE 80-4.5 MCG/ACT IN AERO
2.0000 | INHALATION_SPRAY | Freq: Every day | RESPIRATORY_TRACT | 0 refills | Status: DC
  Filled 2021-05-27: qty 10.2, 30d supply, fill #0

## 2021-05-27 NOTE — Progress Notes (Signed)
Order placed for a Walker through [employer] with [employer]. Equipment to be delivered to [name] [name]'s home.

## 2021-05-27 NOTE — Progress Notes (Signed)
[facility]  [phone] Fax:([phone]) [phone]   Name: [name] [name] Date: 05/27/2021 MRN: [id number]  DOB: 1955-10-25  Patient Care Team: [name] Perna, NP as PCP - General (Internal Medicine) [name] Bears, MD as Consulting Physician (Oncology) [name] Genera, MD as Consulting Physician (Hematology) [name] Shape, NP as Nurse Practitioner (Oncology) Pickenpack-Cousar, Carlena Sax, NP as Nurse Practitioner (Nurse Practitioner)     I connected with [name] [name] on 05/27/21 at 11:30 AM EST by phone and verified that I am speaking with the correct person using two identifiers.   I discussed the limitations, risks, security and privacy concerns of performing an evaluation and management service by telemedicine and the availability of in-person appointments. I also discussed with the patient that there may be a patient responsible charge related to this service. The patient expressed understanding and agreed to proceed.   Other persons participating in the visit and their role in the encounter: [name] [name], HCPOA/friend and [name] [name], [place] [facility]  Patients location: [facility] skilled facility Providers location: [facility]  INTERVAL HISTORY: [name] [name] is a 66 y.o. male with medical history of COPD, hypertension, and metastatic NSCLC s/p chemotherapy/immunotherapy, craniotomy (08/2020) w/p SRS to brain.  Palliative ask to follow for ongoing goals of care support.  SOCIAL HISTORY:     reports that he has quit smoking. His smoking use included cigarettes. He smoked an average of .5 packs per day. He has never used smokeless tobacco. He reports that he does not currently use alcohol. He reports that he does not use drugs.  ADVANCE DIRECTIVES:  On file. [name] [name] is documented Health Care Power of Attorney  CODE STATUS: Full code  PAST MEDICAL HISTORY: Past Medical History:  Diagnosis Date    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[place]) 12/25/2019   Cancer ([place])    COPD (chronic obstructive pulmonary disease) ([place]) 11/02/2007   Qualifier: Diagnosis of  By: [name] Novas MD, [name] [name]    Hypertension 12/24/2019   Iron deficiency anemia 08/23/2007   Qualifier: Diagnosis of  By: [name] [name]      ALLERGIES:  is allergic to other.  MEDICATIONS:  Current Outpatient Medications  Medication Sig Dispense Refill   acetaminophen (TYLENOL) 500 MG tablet Take 500 mg by mouth every 6 (six) hours as needed for mild pain or moderate pain.     budesonide-formoterol (SYMBICORT) 80-4.5 MCG/ACT inhaler Inhale 2 puffs into the lungs 2 (two) times daily. 1 each 11   clonazePAM (KLONOPIN) 0.25 MG disintegrating tablet Take 1 tablet (0.25 mg total) by mouth 2 (two) times daily. 30 tablet 0   dexamethasone (DECADRON) 4 MG tablet Take 4mg  three times daily for 5 days, then 4mg  twice daily.     feeding supplement (ENSURE ENLIVE / ENSURE PLUS) LIQD Take 237 mLs by mouth 2 (two) times daily between meals. 597 mL 12   folic acid (FOLVITE) 1 MG tablet Take 1 tablet (1 mg total) by mouth daily. 30 tablet 4   INCRUSE ELLIPTA 62.5 MCG/ACT AEPB Inhale 1 puff into the lungs daily.     ipratropium-albuterol (DUONEB) 0.5-2.5 (3) MG/3ML SOLN Take 3 mLs by nebulization every 4 (four) hours as needed. 360 mL    levETIRAcetam (KEPPRA) 1000 MG tablet Take 1 tablet (1,000 mg total) by mouth 2 (two) times daily. 180 tablet 4   lidocaine-prilocaine (EMLA) cream Apply 1 application topically as needed. 30 g 1   Multiple Vitamin (MULTIVITAMIN) tablet Take 1 tablet by mouth daily.  nicotine polacrilex (COMMIT) 2 MG lozenge Take 2 mg by mouth as needed.     pantoprazole (PROTONIX) 40 MG tablet Take 1 tablet (40 mg total) by mouth daily with supper. 30 tablet 2   polyethylene glycol (MIRALAX / GLYCOLAX) 17 g packet Take 17 g by mouth daily. 14 each 0   prochlorperazine (COMPAZINE) 10 MG tablet Take 1 tablet (10 mg total) by mouth every 6 (six) hours  as needed for nausea or vomiting. 30 tablet 0   senna (SENOKOT) 8.6 MG TABS tablet Take 1 tablet (8.6 mg total) by mouth 2 (two) times daily. 120 tablet 0   tiotropium (SPIRIVA HANDIHALER) 18 MCG inhalation capsule Place 1 capsule into inhaler and inhale daily. 30 capsule 1   VENTOLIN HFA 108 (90 Base) MCG/ACT inhaler Inhale 2 puffs into the lungs every 4 (four) hours as needed for wheezing or shortness of breath.     No current facility-administered medications for this visit.    VITAL SIGNS: There were no vitals taken for this visit. There were no vitals filed for this visit.  Estimated body mass index is 25.51 kg/m as calculated from the following:   Height as of 05/22/21: 5' (1.524 m).   Weight as of 05/22/21: 130 lb 9.6 oz (59.2 kg).   PERFORMANCE STATUS (ECOG) : 2 - Symptomatic, <50% confined to bed   Physical Exam General: NAD Neurological: AAOx3  IMPRESSION:  I spoke with [name] and [name] [name] via phone for follow-up visit.  Patient given permission to call [name] [name] for conference call.  He expressed his appreciation of being discharged from [facility] facility today at 2 PM returning to his home.  [name] [name], Education officer, museum from [place] also at patient's bedside and with permission included in conversations.  Ezana reports he is doing much better.  Is able to perform most ADLs independently.  Does continue to endorse some weakness.  Is ambulatory with walker.  States he occasionally uses transfer wheelchair when walking long distances.  [name] [name], CSW is arranging home equipment and outpatient PT/OT.  She reports home health PT/OT has been difficult due to his insurance.  He denies pain or discomfort.  Is having daily bowel movements.  He and [name] [name] are remaining hopeful for his ongoing improvement/stability.  Ongoing will be provided as needed.  I reviewed future appointments that are scheduled with him.  Will review with [name] [name] verbalized understanding and appreciation for ongoing  support.  I discussed the importance of continued conversation with family and their medical providers regarding overall plan of care and treatment options, ensuring decisions are within the context of the patients values and GOCs.  PLAN: Currently no symptom management needs. Patient is being discharged from [facility] skilled facility on today with plans for outpatient rehab.  He and [name] [name] are remaining hopeful for ongoing stability/improvement.  The goal is to continue to take things day by day. Will order walker for home. Future appointments reviewed. I will plan to see patient back in 4 weeks for ongoing support.   Patient expressed understanding and was in agreement with this plan. He also understands that He can call the clinic at any time with any questions, concerns, or complaints.   Time Total: 25 min  Visit consisted of counseling and education dealing with the complex and emotionally intense issues of symptom management and palliative care in the setting of serious and potentially life-threatening illness.Greater than 50%  of this time was spent counseling and coordinating care related to the above assessment and plan.  Signed by: Alda Lea, AGPCNP-BC [place]

## 2021-05-28 ENCOUNTER — Other Ambulatory Visit: Payer: Self-pay

## 2021-05-28 ENCOUNTER — Telehealth: Payer: Self-pay | Admitting: Internal Medicine

## 2021-05-28 ENCOUNTER — Telehealth: Payer: Self-pay | Admitting: Medical Oncology

## 2021-05-28 ENCOUNTER — Ambulatory Visit: Payer: Medicaid Other | Attending: Nurse Practitioner | Admitting: Physical Therapy

## 2021-05-28 ENCOUNTER — Encounter: Payer: Self-pay | Admitting: Physical Therapy

## 2021-05-28 DIAGNOSIS — R278 Other lack of coordination: Secondary | ICD-10-CM | POA: Insufficient documentation

## 2021-05-28 DIAGNOSIS — M6281 Muscle weakness (generalized): Secondary | ICD-10-CM | POA: Diagnosis present

## 2021-05-28 DIAGNOSIS — R2681 Unsteadiness on feet: Secondary | ICD-10-CM | POA: Diagnosis present

## 2021-05-28 DIAGNOSIS — R2689 Other abnormalities of gait and mobility: Secondary | ICD-10-CM | POA: Insufficient documentation

## 2021-05-28 DIAGNOSIS — R41841 Cognitive communication deficit: Secondary | ICD-10-CM | POA: Diagnosis not present

## 2021-05-28 DIAGNOSIS — R262 Difficulty in walking, not elsewhere classified: Secondary | ICD-10-CM | POA: Diagnosis present

## 2021-05-28 DIAGNOSIS — R41844 Frontal lobe and executive function deficit: Secondary | ICD-10-CM | POA: Insufficient documentation

## 2021-05-28 MED ORDER — CLONAZEPAM 0.5 MG PO TABS
ORAL_TABLET | ORAL | 0 refills | Status: DC
  Filled 2021-06-25: qty 15, 15d supply, fill #0

## 2021-05-28 NOTE — Telephone Encounter (Signed)
Sch per 1/9 inbasket , pt aware

## 2021-05-28 NOTE — Telephone Encounter (Signed)
Pt in rehab [facility].    Ethel Rana, PT said pt is a good candidate for Speech and OT to evaluate and treat.    Please advise.

## 2021-05-28 NOTE — Therapy (Addendum)
[facility]. [place], [place], [address] Phone: [phone]   Fax:  [phone]  Physical Therapy Evaluation  Patient Details  Name: [name] [name] MRN: [id number] Date of Birth: 1955/08/06 No data recorded  Encounter Date: 05/28/2021   PT End of Session - 05/28/21 1207     Visit Number 1    Number of Visits 16    Date for PT Re-Evaluation 07/23/21    Authorization Type [employer]    PT Start Time 0933    PT Stop Time 3382    PT Time Calculation (min) 41 min    Activity Tolerance Patient tolerated treatment well;Patient limited by fatigue    Behavior During Therapy [facility] for tasks assessed/performed             Past Medical History:  Diagnosis Date   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[place]) 12/25/2019   Cancer ([place])    COPD (chronic obstructive pulmonary disease) ([place]) 11/02/2007   Qualifier: Diagnosis of  By: Melvyn Novas MD, Christena Deem    Hypertension 12/24/2019   Iron deficiency anemia 08/23/2007   Qualifier: Diagnosis of  By: Jim Like      Past Surgical History:  Procedure Laterality Date   APPLICATION OF CRANIAL NAVIGATION N/A 08/30/2020   Procedure: APPLICATION OF CRANIAL NAVIGATION;  Surgeon: Consuella Lose, MD;  Location: [facility];  Service: Neurosurgery;  Laterality: N/A;   CRANIOTOMY Left 08/30/2020   Procedure: Stereotactic left frontoparietal craniectomy for resection of tumor with brainlab;  Surgeon: Consuella Lose, MD;  Location: [facility];  Service: Neurosurgery;  Laterality: Left;   ENDOBRONCHIAL ULTRASOUND N/A 12/27/2019   Procedure: ENDOBRONCHIAL ULTRASOUND;  Surgeon: Laurin Coder, MD;  Location: [facility];  Service: Endoscopy;  Laterality: N/A;   FINE NEEDLE ASPIRATION  12/27/2019   Procedure: FINE NEEDLE ASPIRATION;  Surgeon: Laurin Coder, MD;  Location: [facility];  Service: Endoscopy;;   IR IMAGING GUIDED PORT INSERTION  11/30/2020   VIDEO BRONCHOSCOPY N/A 12/27/2019   Procedure: VIDEO  BRONCHOSCOPY WITHOUT FLUORO;  Surgeon: Laurin Coder, MD;  Location: [facility];  Service: Endoscopy;  Laterality: N/A;    There were no vitals filed for this visit.    Subjective Assessment - 05/28/21 0930     Subjective Patient was recently hospitalized due to fall. He went to rehab at [facility]. He was told he had a seizure when he fell. He also is fighting lung cancerwith brain mets. Followed by Palliative Care. he demosntrated some difficulty either with word finding or verbalizing his thoughts. He also reports difficulty with his self care and getting into his tub at home, upper body weakness. He was perparing his own meals, has not attempted since he returned home, but it is reasonable to expect difficulty due to his deficits.    Pertinent History COPD, HTN, metastatic NSCLC s/p chemo/immunotherapy, craniotomy 4/44 w/p SRS to brain    How long can you stand comfortably? 5-10 minutes    How long can you walk comfortably? 2 minutes.    Patient Stated Goals He wants to get back to his PLOF. This includes his own cooking, cleaning, laundry. He gets fatigued now.    Currently in Pain? No/denies                [facility] PT Assessment - 05/28/21 0001       Assessment   Medical Diagnosis lung Ca wqith brain mets    Prior Therapy [facility], D/C'd home 05/27/21      Balance  Screen   Has the patient fallen in the past 6 months Yes    How many times? [place] residence    Living Arrangements Alone    Available Help at Discharge Friend(s)    Type of [place] to enter    Entrance Stairs-Number of Steps 2    Entrance Stairs-Rails None    Home Layout One level    [place] - 2 wheels;Wheelchair - manual;Tub bench;Grab bars - tub/shower;Bedside commode    Additional Comments He has difficulty getting R leg into bathtub. He has not tried to prepare any meals since returning home. He thinks he will  be getting meals on wheels.      Prior Function   Level of Independence Independent      ROM / Strength   AROM / PROM / Strength AROM;Strength      AROM   Overall AROM Comments Grossly WFL, but mildly decreased in B shoulders, hips, iwth R hip ROM more limited, especially abduction      Strength   Overall Strength Comments BUE strength mildly impaired thorughout, R slighlty weaker than L.    Strength Assessment Site Hip;Knee;Ankle    Right/Left Hip Right;Left    Right Hip Flexion 2+/5    Right Hip Extension 2+/5    Right Hip ABduction 3/5    Left Hip Flexion 3+/5    Left Hip Extension 3-/5    Left Hip ABduction 3+/5    Right/Left Knee Right;Left    Right Knee Flexion 3+/5    Right Knee Extension 3+/5    Left Knee Flexion 4-/5    Left Knee Extension 4-/5    Right/Left Ankle Right;Left    Right Ankle Dorsiflexion 3+/5    Left Ankle Dorsiflexion 4-/5      Bed Mobility   Bed Mobility Supine to Sit;Sit to Supine    Supine to Sit Supervision/Verbal cueing   Patient struggled, but able to perform without physical assistance.   Sit to Supine Minimal Assistance - Patient > 75%   Needed A to move RLE onto the surface and move it over.     Ambulation/Gait   Ambulation/Gait Yes    Ambulation/Gait Assistance 6: Modified independent (Device/Increase time)    Ambulation Distance (Feet) 100 Feet    Assistive device Rolling walker    Gait Pattern Step-through pattern;Decreased step length - right;Decreased step length - left;Decreased hip/knee flexion - right;Shuffle   slow speed, difficyulty turning.   Gait velocity decreased      Standardized Balance Assessment   Standardized Balance Assessment Five Times Sit to Stand;Timed Up and Go Test;Berg Balance Test    Five times sit to stand comments  32.53      Timed Up and Go Test   Normal TUG (seconds) 25.35                        Objective measurements completed on examination: See above findings.                 PT Education - 05/28/21 1206     Education Details POC. Recommendation for SLP and OT consults.    Person(s) Educated Patient    Methods Explanation    Comprehension Verbalized understanding              PT Short Term Goals - 05/28/21 1215  PT SHORT TERM GOAL #1   Title I with basic HEP    Time 4    Period Weeks    Status New    Target Date 06/25/21               PT Long Term Goals - 05/28/21 1215       PT LONG TERM GOAL #1   Title I with final HEP    Time 8    Period Weeks    Status New    Target Date 07/23/21      PT LONG TERM GOAL #2   Title Increase functoinal strength to allow 5 x sit to stand in < 20 seconds    Baseline 32 sec    Time 8    Period Weeks    Status New    Target Date 07/23/21      PT LONG TERM GOAL #3   Title Improve TUG to < 15 seconds    Baseline 25    Time 8    Period Weeks    Status New    Target Date 07/23/21      PT LONG TERM GOAL #4   Title Score at least 48/56 on BERG to decrease fall risk.    Time 8    Period Weeks    Status New    Target Date 07/23/21      PT LONG TERM GOAL #5   Title Patient will ambulate x at least 400' with LRAD, MI, to increase his community mobility.    Baseline 100' slow, RW    Time 8    Period Weeks    Status New    Target Date 07/23/21                    Plan - 05/28/21 1014     Clinical Impression Statement Patient presents iwth H/O metastatic lung cancer, wtih brain mets. He is followed by Palliative care. he recently had a seizure, per his report, and was hospitalized. He then went to [facility] for rehab and was D/C'd home on 05/27/21. Patient demosntrates significant weakness, decreased blaance, decreased functional strength and endurance, decresaed activity tolerance. He lives alone and is at high risk for return to hospital unless he receives therapy to address his deificts as he is currnelty unable to manage his needs at home. he would  benefit from OT and SLP consults as well due to noted dificits in self care, strenth, and apparent language deficits.    Personal Factors and Comorbidities Comorbidity 1    Comorbidities lung Ca with brain mets    Examination-Activity Limitations Bathing;Locomotion Level;Transfers;Bed Mobility;Reach Overhead;Sit;Carry;Squat;Dressing;Stairs;Hygiene/Grooming;Toileting;Lift;Stand    Examination-Participation Restrictions Meal Prep;Cleaning;Driving;Interpersonal Relationship;Shop;Laundry    Stability/Clinical Decision Making Evolving/Moderate complexity    Clinical Decision Making Moderate    Rehab Potential Good    PT Frequency 2x / week    PT Duration 8 weeks    PT Treatment/Interventions ADLs/Self Care Home Management;Neuromuscular re-education;Balance training;Therapeutic exercise;Therapeutic activities;Functional mobility training;Stair training;Gait training;Passive range of motion;Manual techniques    PT Next Visit Plan Assess BERG, initiate HEP    Recommended Other Services OT and SLP- Patient reports difficulty with bathing, dressing, meal prep, demosntrates poor activity toelrance. Also had difficulty at times in verbal responses to questions    Consulted and Agree with Plan of Care Patient             Patient will benefit from skilled therapeutic intervention in order to improve the following  deficits and impairments:  Abnormal gait, Decreased coordination, Difficulty walking, Decreased endurance, Decreased activity tolerance, Impaired flexibility, Decreased balance, Decreased mobility, Decreased strength, Postural dysfunction  Visit Diagnosis: Muscle weakness (generalized)  Difficulty in walking, not elsewhere classified  Unsteadiness on feet  Other lack of coordination     Problem List Patient Active Problem List   Diagnosis Date Noted   Gross hematuria 04/07/2021   Status 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 ([place]) 04/07/2021   Aspiration pneumonia of right  lower lobe ([place]) 04/07/2021   Pericardial effusion 04/07/2021   Acute respiratory failure with hypoxia ([place])    Acute metabolic encephalopathy 96/28/3662   Seizures ([place]) 04/01/2021   Neutropenia ([place]) 12/17/2020   Brain mass 08/24/2020   Brain metastasis ([place]) 08/23/2020   Encounter for antineoplastic immunotherapy 04/02/2020   Chemotherapy induced neutropenia ([place]) 02/28/2020   Malignant neoplasm of bronchus of right upper lobe ([place]) 01/03/2020   Encounter for antineoplastic chemotherapy 01/03/2020   Goals of care, counseling/discussion 01/03/2020   Malnutrition of moderate degree 12/26/2019   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[place]) 12/25/2019   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  Marcelina Morel, DPT 05/28/2021, 12:22 PM  [facility]. [place], [place], [address] Phone: [phone]   Fax:  [phone]  Name: Jencarlo Bonadonna MRN: [id number] Date of Birth: [birth year]

## 2021-05-29 ENCOUNTER — Encounter: Payer: Self-pay | Admitting: Internal Medicine

## 2021-05-29 ENCOUNTER — Inpatient Hospital Stay: Payer: Medicaid Other

## 2021-05-29 ENCOUNTER — Encounter: Payer: Self-pay | Admitting: Physician Assistant

## 2021-05-29 ENCOUNTER — Other Ambulatory Visit: Payer: Self-pay

## 2021-05-29 ENCOUNTER — Inpatient Hospital Stay (HOSPITAL_BASED_OUTPATIENT_CLINIC_OR_DEPARTMENT_OTHER): Payer: Medicaid Other | Admitting: Internal Medicine

## 2021-05-29 VITALS — BP 111/78 | HR 101 | Temp 97.7°F | Resp 18 | Ht 60.0 in | Wt 130.0 lb

## 2021-05-29 DIAGNOSIS — C3411 Malignant neoplasm of upper lobe, right bronchus or lung: Secondary | ICD-10-CM | POA: Diagnosis present

## 2021-05-29 DIAGNOSIS — C3490 Malignant neoplasm of unspecified part of unspecified bronchus or lung: Secondary | ICD-10-CM

## 2021-05-29 DIAGNOSIS — R569 Unspecified convulsions: Secondary | ICD-10-CM | POA: Diagnosis not present

## 2021-05-29 DIAGNOSIS — G9340 Encephalopathy, unspecified: Secondary | ICD-10-CM | POA: Diagnosis not present

## 2021-05-29 DIAGNOSIS — C7931 Secondary malignant neoplasm of brain: Secondary | ICD-10-CM | POA: Diagnosis not present

## 2021-05-29 LAB — CBC WITH DIFFERENTIAL (CANCER CENTER ONLY)
Abs Immature Granulocytes: 0.32 10*3/uL — ABNORMAL HIGH (ref 0.00–0.07)
Basophils Absolute: 0 10*3/uL (ref 0.0–0.1)
Basophils Relative: 1 %
Eosinophils Absolute: 0 10*3/uL (ref 0.0–0.5)
Eosinophils Relative: 0 %
HCT: 38.1 % — ABNORMAL LOW (ref 39.0–52.0)
Hemoglobin: 12.2 g/dL — ABNORMAL LOW (ref 13.0–17.0)
Immature Granulocytes: 4 %
Lymphocytes Relative: 15 %
Lymphs Abs: 1.1 10*3/uL (ref 0.7–4.0)
MCH: 24.9 pg — ABNORMAL LOW (ref 26.0–34.0)
MCHC: 32 g/dL (ref 30.0–36.0)
MCV: 77.8 fL — ABNORMAL LOW (ref 80.0–100.0)
Monocytes Absolute: 0.5 10*3/uL (ref 0.1–1.0)
Monocytes Relative: 7 %
Neutro Abs: 5.6 10*3/uL (ref 1.7–7.7)
Neutrophils Relative %: 73 %
Platelet Count: 257 10*3/uL (ref 150–400)
RBC: 4.9 MIL/uL (ref 4.22–5.81)
RDW: 15.4 % (ref 11.5–15.5)
WBC Count: 7.6 10*3/uL (ref 4.0–10.5)
nRBC: 0.3 % — ABNORMAL HIGH (ref 0.0–0.2)

## 2021-05-29 LAB — CMP (CANCER CENTER ONLY)
ALT: 22 U/L (ref 0–44)
AST: 23 U/L (ref 15–41)
Albumin: 4 g/dL (ref 3.5–5.0)
Alkaline Phosphatase: 58 U/L (ref 38–126)
Anion gap: 10 (ref 5–15)
BUN: 14 mg/dL (ref 8–23)
CO2: 27 mmol/L (ref 22–32)
Calcium: 9.6 mg/dL (ref 8.9–10.3)
Chloride: 101 mmol/L (ref 98–111)
Creatinine: 0.91 mg/dL (ref 0.61–1.24)
GFR, Estimated: >60 mL/min (ref >60)
Glucose, Bld: 90 mg/dL (ref 70–99)
Potassium: 4.1 mmol/L (ref 3.5–5.1)
Sodium: 138 mmol/L (ref 135–145)
Total Bilirubin: 0.4 mg/dL (ref 0.3–1.2)
Total Protein: 8 g/dL (ref 6.5–8.1)

## 2021-05-29 NOTE — Progress Notes (Signed)
.[facility] Telephone:([phone]) [phone]   Fax:([phone]) [phone]  OFFICE PROGRESS NOTE  [name], NP [address] [place] [address]  DIAGNOSIS: Metastatic non-small cell lung cancer initially diagnosed as stage IIIA (Tx, N2, M0) non-small cell lung cancer, adenocarcinoma diagnosed in August 2021 and presented with large right paratracheal soft tissue mass extending from the right apex to the right suprahilar and precarinal region.  Molecular studies by [employer]:  [id number], 31.9%, Olaparib  [id number], 68.6%. None   BRAFAmplification, High (+++) Plasma Copy Number: 5.0  PRIOR THERAPY:  1) Concurrent chemoradiation with weekly carboplatin for AUC of 2 and paclitaxel 45 mg/M2.  First dose January 17, 2020.  Status post 7 cycles.  Last dose was given on February 28, 2020 2) Consolidation treatment with immunotherapy with Imfinzi 1500 mg IV every 4 weeks.  First dose April 09, 2020.  Status post 5 cycles.  Last dose was given July 30, 2020 discontinued secondary to disease progression with brain metastasis. 3) Stereotactic left frontoparietal craniotomy for resection of tumor on 08/30/2020.   CURRENT THERAPY: First-line systemic chemotherapy with carboplatin for AUC of 5, Alimta 500 Mg/M2 and Keytruda 200 Mg IV every 3 weeks.  First dose Oct 08, 2020.  Status post 8 cycles.  Starting from cycle #5 he will be on maintenance treatment with Alimta and Keytruda every 3 weeks.  INTERVAL HISTORY: [name] [name] 66 y.o. male returns to the clinic today for follow-up visit.  The patient has been off treatment since middle of October 2022.  He was admitted to [facility] with seizure activity and progressive brain metastasis.  He was seen by Dr. Mickeal Skinner at that time.  He started on treatment with Keppra 1000 mg p.o. twice daily in addition to Decadron 4 mg p.o. twice daily.  He was discharged at that time to a rehab facility for physical therapy.  He  missed several of his treatment in the interval.  He came today for evaluation and recommendation regarding his condition.  He continues to have fatigue and weakness.  He is using a walker.  He denied having any current chest pain but has shortness of breath with exertion with no cough or hemoptysis.  He has no nausea, vomiting, diarrhea or constipation.  He has no headache or visual changes.  MEDICAL HISTORY: Past Medical History:  Diagnosis Date   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[place]) 12/25/2019   Cancer ([place])    COPD (chronic obstructive pulmonary disease) ([place]) 11/02/2007   Qualifier: Diagnosis of  By: Melvyn Novas MD, Christena Deem    Hypertension 12/24/2019   Iron deficiency anemia 08/23/2007   Qualifier: Diagnosis of  By: Jim Like      ALLERGIES:  is allergic to other.  MEDICATIONS:  Current Outpatient Medications  Medication Sig Dispense Refill   acetaminophen (TYLENOL) 500 MG tablet Take 500 mg by mouth every 6 (six) hours as needed for mild pain or moderate pain.     budesonide-formoterol (SYMBICORT) 80-4.5 MCG/ACT inhaler Inhale 2 puffs into the lungs 2 (two) times daily. 1 each 11   budesonide-formoterol (SYMBICORT) 80-4.5 MCG/ACT inhaler Inhale 2 puffs into the lungs daily. 10.2 g 0   clonazePAM (KLONOPIN) 0.25 MG disintegrating tablet Take 1 tablet (0.25 mg total) by mouth 2 (two) times daily. 30 tablet 0   clonazePAM (KLONOPIN) 0.5 MG tablet take 1/2 tab by mouth twice daily 30 tablet 0   dexamethasone (DECADRON) 2 MG tablet Take 1 tablet (2 mg total)  by mouth daily. 30 tablet 0   dexamethasone (DECADRON) 4 MG tablet Take 4mg  three times daily for 5 days, then 4mg  twice daily.     feeding supplement (ENSURE ENLIVE / ENSURE PLUS) LIQD Take 237 mLs by mouth 2 (two) times daily between meals. 448 mL 12   folic acid (FOLVITE) 1 MG tablet Take 1 tablet (1 mg total) by mouth daily. 30 tablet 4   folic acid (FOLVITE) 1 MG tablet Take 1 tablet (1 mg total) by mouth daily. 30 tablet 0   INCRUSE ELLIPTA  62.5 MCG/ACT AEPB Inhale 1 puff into the lungs daily.     ipratropium-albuterol (DUONEB) 0.5-2.5 (3) MG/3ML SOLN Take 3 mLs by nebulization every 4 (four) hours as needed. 360 mL    levETIRAcetam (KEPPRA) 1000 MG tablet Take 1 tablet (1,000 mg total) by mouth 2 (two) times daily. 180 tablet 4   levETIRAcetam (KEPPRA) 1000 MG tablet Take 1 tablet (1,000 mg total) by mouth 2 (two) times daily. 60 tablet 0   lidocaine-prilocaine (EMLA) cream Apply 1 application topically as needed. 30 g 1   Multiple Vitamin (MULTIVITAMIN) tablet Take 1 tablet by mouth daily.     nicotine polacrilex (COMMIT) 2 MG lozenge Take 2 mg by mouth as needed.     pantoprazole (PROTONIX) 40 MG tablet Take 1 tablet (40 mg total) by mouth daily with supper. 30 tablet 2   pantoprazole (PROTONIX) 40 MG tablet Take 1 tablet (40 mg total) by mouth daily. 30 tablet 0   polyethylene glycol (MIRALAX / GLYCOLAX) 17 g packet Take 17 g by mouth daily. 14 each 0   prochlorperazine (COMPAZINE) 10 MG tablet Take 1 tablet (10 mg total) by mouth every 6 (six) hours as needed for nausea or vomiting. 30 tablet 0   prochlorperazine (COMPAZINE) 10 MG tablet Take 1 tablet (10 mg total) by mouth every 6 hours for nausea 30 tablet 0   senna (SENOKOT) 8.6 MG TABS tablet Take 1 tablet (8.6 mg total) by mouth 2 (two) times daily. 120 tablet 0   tiotropium (SPIRIVA HANDIHALER) 18 MCG inhalation capsule Place 1 capsule into inhaler and inhale daily. 30 capsule 1   umeclidinium bromide (INCRUSE ELLIPTA) 62.5 MCG/ACT AEPB Inhale 1 puff into the lungs daily. 30 each 0   VENTOLIN HFA 108 (90 Base) MCG/ACT inhaler Inhale 2 puffs into the lungs every 4 (four) hours as needed for wheezing or shortness of breath.     No current facility-administered medications for this visit.    SURGICAL HISTORY:  Past Surgical History:  Procedure Laterality Date   APPLICATION OF CRANIAL NAVIGATION N/A 08/30/2020   Procedure: APPLICATION OF CRANIAL NAVIGATION;  Surgeon:  Consuella Lose, MD;  Location: [place];  Service: Neurosurgery;  Laterality: N/A;   CRANIOTOMY Left 08/30/2020   Procedure: Stereotactic left frontoparietal craniectomy for resection of tumor with brainlab;  Surgeon: Consuella Lose, MD;  Location: [place];  Service: Neurosurgery;  Laterality: Left;   ENDOBRONCHIAL ULTRASOUND N/A 12/27/2019   Procedure: ENDOBRONCHIAL ULTRASOUND;  Surgeon: Laurin Coder, MD;  [facility];  Service: Endoscopy;  Laterality: N/A;   FINE NEEDLE ASPIRATION  12/27/2019   Procedure: FINE NEEDLE ASPIRATION;  Surgeon: Laurin Coder, MD;  Location[facility];  Service: Endoscopy;;   IR IMAGING GUIDED PORT INSERTION  11/30/2020   VIDEO BRONCHOSCOPY N/A 12/27/2019   Procedure: VIDEO BRONCHOSCOPY WITHOUT FLUORO;  Surgeon: Laurin Coder, MD;  [facility];  Service: Endoscopy;  Laterality: N/A;  REVIEW OF SYSTEMS:  A comprehensive review of systems was negative except for: Constitutional: positive for fatigue Respiratory: positive for dyspnea on exertion Neurological: positive for weakness   PHYSICAL EXAMINATION: General appearance: alert, cooperative, and no distress Head: Normocephalic, without obvious abnormality, atraumatic Neck: no adenopathy, no JVD, supple, symmetrical, trachea midline, and thyroid not enlarged, symmetric, no tenderness/mass/nodules Lymph nodes: Cervical, supraclavicular, and axillary nodes normal. Resp: clear to auscultation bilaterally Back: symmetric, no curvature. ROM normal. No CVA tenderness. Cardio: regular rate and rhythm, S1, S2 normal, no murmur, click, rub or gallop GI: soft, non-tender; bowel sounds normal; no masses,  no organomegaly Extremities: extremities normal, atraumatic, no cyanosis or edema  ECOG PERFORMANCE STATUS: 1 - Symptomatic but completely ambulatory  Blood pressure 111/78, pulse (!) 101, temperature 97.7 F (36.5 C), temperature source Tympanic, resp. rate 18, height 5' (1.524  m), weight 130 lb (59 kg), SpO2 99 %.  LABORATORY DATA: Lab Results  Component Value Date   WBC 7.6 05/29/2021   HGB 12.2 (L) 05/29/2021   HCT 38.1 (L) 05/29/2021   MCV 77.8 (L) 05/29/2021   PLT 257 05/29/2021      Chemistry      Component Value Date/Time   NA 134 (L) 04/14/2021 0155   NA 144 08/03/2019 1527   K 3.9 04/14/2021 0155   CL 98 04/14/2021 0155   CO2 27 04/14/2021 0155   BUN 18 04/14/2021 0155   BUN 9 08/03/2019 1527   CREATININE 0.74 04/14/2021 0155   CREATININE 0.68 03/04/2021 0916      Component Value Date/Time   CALCIUM 9.2 04/14/2021 0155   ALKPHOS 53 04/01/2021 1859   AST 27 04/01/2021 1859   AST 24 03/04/2021 0916   ALT 12 04/01/2021 1859   ALT 13 03/04/2021 0916   BILITOT 0.4 04/01/2021 1859   BILITOT 0.3 03/04/2021 0916       RADIOGRAPHIC STUDIES: No results found.  ASSESSMENT AND PLAN: This is a very pleasant 66 years old African-American male recently diagnosed with metastatic non-small cell lung cancer that was initially diagnosed as stage IIIA (Tx, N2, M0) non-small cell lung cancer, adenocarcinoma presented with large right paratracheal soft tissue mass extending from the right apex to the right suprahilar and precarinal region diagnosed in August 2021.  The patient developed brain metastasis in April 2022. Molecular studies by [employer] showed no actionable mutations. The patient completed a course of concurrent chemoradiation with weekly carboplatin for AUC of 2 and paclitaxel 45 mg/M2.  He is status post 7 cycles.  Last dose was given February 28, 2020 The patient tolerated the previous treatment well and he had partial response. The patient is currently undergoing consolidation treatment with immunotherapy with Imfinzi 1500 mg IV every 4 weeks status post 5 cycles. 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 The patient is here today to start the first cycle of systemic chemotherapy with  carboplatin for AUC of 5, Alimta 500 Mg/M2 and Keytruda 200 Mg IV every 3 weeks.  Status post 8 cycles.  Starting from cycle #5 the patient will be on maintenance treatment with Alimta and Keytruda every 3 weeks. His last treatment was given in the middle of October 2022. The patient has been off treatment since that time because of his encephalopathy and seizure activity followed by rehabilitation. I recommended for the patient to have repeat CT scan of the chest for restaging of his disease.  He had CT scan of the  abdomen pelvis during his hospitalization for evaluation of hematuria and that showed no disease progression in that area. I will see him back for follow-up visit in around 10 days for evaluation and discussion of his treatment options based on the scan results. For the history of seizure and brain metastasis, he will continue his current treatment with Keppra and taper dose of Decadron as prescribed by Dr. Mickeal Skinner. The patient was advised to call immediately if he has any other concerning symptoms in the interval. The patient voices understanding of current disease status and treatment options and is in agreement with the current care plan.  All questions were answered. The patient knows to call the clinic with any problems, questions or concerns. We can certainly see the patient much sooner if necessary.  Disclaimer: This note was dictated with voice recognition software. Similar sounding words can inadvertently be transcribed and may not be corrected upon review.

## 2021-05-30 ENCOUNTER — Other Ambulatory Visit: Payer: Self-pay | Admitting: Medical Oncology

## 2021-05-30 ENCOUNTER — Encounter: Payer: Self-pay | Admitting: Physical Therapy

## 2021-05-30 ENCOUNTER — Ambulatory Visit: Payer: Medicaid Other | Admitting: Physical Therapy

## 2021-05-30 DIAGNOSIS — R569 Unspecified convulsions: Secondary | ICD-10-CM

## 2021-05-30 DIAGNOSIS — R262 Difficulty in walking, not elsewhere classified: Secondary | ICD-10-CM

## 2021-05-30 DIAGNOSIS — R2681 Unsteadiness on feet: Secondary | ICD-10-CM

## 2021-05-30 DIAGNOSIS — C7931 Secondary malignant neoplasm of brain: Secondary | ICD-10-CM

## 2021-05-30 DIAGNOSIS — R278 Other lack of coordination: Secondary | ICD-10-CM

## 2021-05-30 DIAGNOSIS — R531 Weakness: Secondary | ICD-10-CM

## 2021-05-30 DIAGNOSIS — M6281 Muscle weakness (generalized): Secondary | ICD-10-CM

## 2021-05-30 NOTE — Addendum Note (Signed)
Addended by: Ardeen Garland on: 05/30/2021 11:40 AM   Modules accepted: Orders

## 2021-05-30 NOTE — Therapy (Signed)
[address] [place], [place], [address] Phone: [phone]   Fax:  [phone]  Physical Therapy Treatment  Patient Details  Name: [name] [name] MRN: [id number] Date of Birth: January 29, 1956 No data recorded  Encounter Date: 05/30/2021   PT End of Session - 05/30/21 1433     Visit Number 2    Number of Visits 16    PT Start Time 6606    PT Stop Time 1441    PT Time Calculation (min) 39 min    Activity Tolerance Patient tolerated treatment well;Patient limited by fatigue    Behavior During Therapy [facility] for tasks assessed/performed             Past Medical History:  Diagnosis Date   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[place]) 12/25/2019   Cancer ([place])    COPD (chronic obstructive pulmonary disease) ([place]) 11/02/2007   Qualifier: Diagnosis of  By: Melvyn Novas MD, Christena Deem    Hypertension 12/24/2019   Iron deficiency anemia 08/23/2007   Qualifier: Diagnosis of  By: Jim Like      Past Surgical History:  Procedure Laterality Date   APPLICATION OF CRANIAL NAVIGATION N/A 08/30/2020   Procedure: APPLICATION OF CRANIAL NAVIGATION;  Surgeon: Consuella Lose, MD;  Location: [place];  Service: Neurosurgery;  Laterality: N/A;   CRANIOTOMY Left 08/30/2020   Procedure: Stereotactic left frontoparietal craniectomy for resection of tumor with brainlab;  Surgeon: Consuella Lose, MD;  Location: [place];  Service: Neurosurgery;  Laterality: Left;   ENDOBRONCHIAL ULTRASOUND N/A 12/27/2019   Procedure: ENDOBRONCHIAL ULTRASOUND;  Surgeon: Laurin Coder, MD;  [facility];  Service: Endoscopy;  Laterality: N/A;   FINE NEEDLE ASPIRATION  12/27/2019   Procedure: FINE NEEDLE ASPIRATION;  Surgeon: Laurin Coder, MD;  [facility];  Service: Endoscopy;;   IR IMAGING GUIDED PORT INSERTION  11/30/2020   VIDEO BRONCHOSCOPY N/A 12/27/2019   Procedure: VIDEO BRONCHOSCOPY WITHOUT FLUORO;  Surgeon: Laurin Coder, MD;  Location:  [facility];  Service: Endoscopy;  Laterality: N/A;    There were no vitals filed for this visit.   Subjective Assessment - 05/30/21 1403     Subjective Patient reports no issues. He is currently using TV dinners at home. Meal on Wheels scheduled to start. He also received his W/C today.    Pertinent History COPD, HTN, metastatic NSCLC s/p chemo/immunotherapy, craniotomy 4/44 w/p SRS to brain                [facility] PT Assessment - 05/30/21 0001       Berg Balance Test   Sit to Stand Able to stand  independently using hands    Standing Unsupported Able to stand safely 2 minutes    Sitting with Back Unsupported but Feet Supported on Floor or Stool Able to sit safely and securely 2 minutes    Stand to Sit Controls descent by using hands    Transfers Able to transfer safely, definite need of hands    Standing Unsupported with Eyes Closed Able to stand 10 seconds with supervision    Standing Unsupported with Feet Together Able to place feet together independently and stand for 1 minute with supervision    From Standing, Reach Forward with Outstretched Arm Can reach forward >12 cm safely (5")    From Standing Position, Pick up Object from Floor Able to pick up shoe, needs supervision    From Standing Position, Turn to Look Behind Over each Shoulder Turn sideways only but  maintains balance    Turn 360 Degrees Able to turn 360 degrees safely but slowly    Standing Unsupported, Alternately Place Feet on Step/Stool Able to stand independently and complete 8 steps >20 seconds    Standing Unsupported, One Foot in Front Able to plae foot ahead of the other independently and hold 30 seconds    Standing on One Leg Able to lift leg independently and hold 5-10 seconds    Total Score 42                           [facility] Adult PT Treatment/Exercise - 05/30/21 0001       Exercises   Exercises Knee/Hip      Knee/Hip Exercises: Standing   Forward Step Up Both;1 set   2 x with  each leg, no UE support, very slow adn effortful.     Knee/Hip Exercises: Seated   Sit to Sand 1 set;10 reps;without UE support   Holding 2# ball in BUE.                      PT Short Term Goals - 05/28/21 1215       PT SHORT TERM GOAL #1   Title I with basic HEP    Time 4    Period Weeks    Status New    Target Date 06/25/21               PT Long Term Goals - 05/28/21 1215       PT LONG TERM GOAL #1   Title I with final HEP    Time 8    Period Weeks    Status New    Target Date 07/23/21      PT LONG TERM GOAL #2   Title Increase functoinal strength to allow 5 x sit to stand in < 20 seconds    Baseline 32 sec    Time 8    Period Weeks    Status New    Target Date 07/23/21      PT LONG TERM GOAL #3   Title Improve TUG to < 15 seconds    Baseline 25    Time 8    Period Weeks    Status New    Target Date 07/23/21      PT LONG TERM GOAL #4   Title Score at least 48/56 on BERG to decrease fall risk.    Time 8    Period Weeks    Status New    Target Date 07/23/21      PT LONG TERM GOAL #5   Title Patient will ambulate x at least 400' with LRAD, MI, to increase his community mobility.    Baseline 100' slow, RW    Time 8    Period Weeks    Status New    Target Date 07/23/21                   Plan - 05/30/21 1437     Clinical Impression Statement Patient reports no issues. Therapist performed BERG assessment, patient at significant fall risk. He also demonstates very slow movement. Facilitated quicker movments to improve balance iwth quick side to side stepping. He had greater difficulty with RLE.    Personal Factors and Comorbidities Comorbidity 1    Comorbidities lung Ca with brain mets    Examination-Activity Limitations Bathing;Locomotion Level;Transfers;Bed Mobility;Reach Overhead;Sit;Carry;Squat;Dressing;Stairs;Hygiene/Grooming;Toileting;Lift;Stand  Examination-Participation Restrictions Meal  Prep;Cleaning;Driving;Interpersonal Relationship;Shop;Laundry    Stability/Clinical Decision Making Evolving/Moderate complexity    Clinical Decision Making Moderate    Rehab Potential Good    PT Frequency 2x / week    PT Duration 8 weeks    PT Treatment/Interventions ADLs/Self Care Home Management;Neuromuscular re-education;Balance training;Therapeutic exercise;Therapeutic activities;Functional mobility training;Stair training;Gait training;Passive range of motion;Manual techniques    PT Next Visit Plan initiate HEP    Consulted and Agree with Plan of Care Patient             Patient will benefit from skilled therapeutic intervention in order to improve the following deficits and impairments:  Abnormal gait, Decreased coordination, Difficulty walking, Decreased endurance, Decreased activity tolerance, Impaired flexibility, Decreased balance, Decreased mobility, Decreased strength, Postural dysfunction  Visit Diagnosis: Muscle weakness (generalized)  Difficulty in walking, not elsewhere classified  Unsteadiness on feet  Other lack of coordination     Problem List Patient Active Problem List   Diagnosis Date Noted   Gross hematuria 04/07/2021   Status 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 ([place]) 04/07/2021   Aspiration pneumonia of right lower lobe ([place]) 04/07/2021   Pericardial effusion 04/07/2021   Acute respiratory failure with hypoxia ([place])    Acute metabolic encephalopathy 65/99/3570   Seizures ([place]) 04/01/2021   Neutropenia ([place]) 12/17/2020   Brain mass 08/24/2020   Brain metastasis ([place]) 08/23/2020   Encounter for antineoplastic immunotherapy 04/02/2020   Chemotherapy induced neutropenia ([place]) 02/28/2020   Malignant neoplasm of bronchus of right upper lobe ([place]) 01/03/2020   Encounter for antineoplastic chemotherapy 01/03/2020   Goals of care, counseling/discussion 01/03/2020   Malnutrition of moderate degree 12/26/2019   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[place]) 12/25/2019    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 [place], [place], [address] Phone: [phone]   Fax:  [phone]  Name: Mivaan Corbitt MRN: [id number] Date of Birth: [birth year]

## 2021-06-03 ENCOUNTER — Encounter: Payer: Self-pay | Admitting: Speech Pathology

## 2021-06-03 ENCOUNTER — Encounter: Payer: Self-pay | Admitting: Occupational Therapy

## 2021-06-03 ENCOUNTER — Ambulatory Visit: Payer: Medicaid Other | Admitting: Physical Therapy

## 2021-06-03 ENCOUNTER — Ambulatory Visit: Payer: Medicaid Other | Admitting: Occupational Therapy

## 2021-06-03 ENCOUNTER — Ambulatory Visit: Payer: Medicaid Other | Admitting: Speech Pathology

## 2021-06-03 ENCOUNTER — Other Ambulatory Visit: Payer: Self-pay | Admitting: Radiation Therapy

## 2021-06-03 ENCOUNTER — Other Ambulatory Visit: Payer: Self-pay

## 2021-06-03 DIAGNOSIS — M6281 Muscle weakness (generalized): Secondary | ICD-10-CM

## 2021-06-03 DIAGNOSIS — R2689 Other abnormalities of gait and mobility: Secondary | ICD-10-CM

## 2021-06-03 DIAGNOSIS — R278 Other lack of coordination: Secondary | ICD-10-CM

## 2021-06-03 DIAGNOSIS — R41841 Cognitive communication deficit: Secondary | ICD-10-CM

## 2021-06-03 DIAGNOSIS — R262 Difficulty in walking, not elsewhere classified: Secondary | ICD-10-CM

## 2021-06-03 DIAGNOSIS — R2681 Unsteadiness on feet: Secondary | ICD-10-CM

## 2021-06-03 DIAGNOSIS — R41844 Frontal lobe and executive function deficit: Secondary | ICD-10-CM

## 2021-06-03 NOTE — Therapy (Signed)
[name]. [place], [place], [address] Phone: [phone]   Fax:  [phone]  Physical Therapy Treatment  Patient Details  Name: [name] [name] MRN: [id number] Date of Birth: [birth year] No data recorded  Encounter Date: 06/03/2021   PT End of Session - 06/03/21 1511     Visit Number 3    Date for PT Re-Evaluation 07/23/21    PT Start Time 1430    PT Stop Time 1515    PT Time Calculation (min) 45 min    Activity Tolerance Patient tolerated treatment well;Patient limited by fatigue    Behavior During Therapy [facility] for tasks assessed/performed             Past Medical History:  Diagnosis Date   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[place]) 12/25/2019   Cancer ([place])    COPD (chronic obstructive pulmonary disease) ([place]) 11/02/2007   Qualifier: Diagnosis of  By: Melvyn Novas MD, Christena Deem    Hypertension 12/24/2019   Iron deficiency anemia 08/23/2007   Qualifier: Diagnosis of  By: Jim Like      Past Surgical History:  Procedure Laterality Date   APPLICATION OF CRANIAL NAVIGATION N/A 08/30/2020   Procedure: APPLICATION OF CRANIAL NAVIGATION;  Surgeon: Consuella Lose, MD;  Location: [facility];  Service: Neurosurgery;  Laterality: N/A;   CRANIOTOMY Left 08/30/2020   Procedure: Stereotactic left frontoparietal craniectomy for resection of tumor with brainlab;  Surgeon: Consuella Lose, MD;  Location: [facility];  Service: Neurosurgery;  Laterality: Left;   ENDOBRONCHIAL ULTRASOUND N/A 12/27/2019   Procedure: ENDOBRONCHIAL ULTRASOUND;  Surgeon: Laurin Coder, MD;  [facility];  Service: Endoscopy;  Laterality: N/A;   FINE NEEDLE ASPIRATION  12/27/2019   Procedure: FINE NEEDLE ASPIRATION;  Surgeon: Laurin Coder, MD;  [facility];  Service: Endoscopy;;   IR IMAGING GUIDED PORT INSERTION  11/30/2020   VIDEO BRONCHOSCOPY N/A 12/27/2019   Procedure: VIDEO BRONCHOSCOPY WITHOUT FLUORO;  Surgeon: Laurin Coder,  MD;  [facility];  Service: Endoscopy;  Laterality: N/A;    There were no vitals filed for this visit.   Subjective Assessment - 06/03/21 1432     Subjective "I guess Im doing ok" No pain just little cramps    Currently in Pain? No/denies                               [facility] Adult PT Treatment/Exercise - 06/03/21 0001       Ambulation/Gait   Ambulation/Gait Yes    Ambulation/Gait Assistance 6: Modified independent (Device/Increase time)    Ambulation Distance (Feet) 125 Feet    Assistive device Rolling walker    Gait Pattern Step-through pattern;Decreased step length - right;Decreased step length - left;Decreased hip/knee flexion - right;Shuffle    Gait velocity decreased      Knee/Hip Exercises: Aerobic   Nustep L4 x 6 min      Knee/Hip Exercises: Standing   Other Standing Knee Exercises Standing march RW 2x10      Knee/Hip Exercises: Seated   Long Arc Quad Both;2 sets;10 reps    Long Arc Quad Weight 2 lbs.    Ball Squeeze 2x10    Marching Both;2 sets;10 reps;Strengthening    Marching Weights 2 lbs.    Sit to Sand 2 sets;10 reps   UE on knees  PT Short Term Goals - 06/03/21 1516       PT SHORT TERM GOAL #1   Title I with basic HEP               PT Long Term Goals - 05/28/21 1215       PT LONG TERM GOAL #1   Title I with final HEP    Time 8    Period Weeks    Status New    Target Date 07/23/21      PT LONG TERM GOAL #2   Title Increase functoinal strength to allow 5 x sit to stand in < 20 seconds    Baseline 32 sec    Time 8    Period Weeks    Status New    Target Date 07/23/21      PT LONG TERM GOAL #3   Title Improve TUG to < 15 seconds    Baseline 25    Time 8    Period Weeks    Status New    Target Date 07/23/21      PT LONG TERM GOAL #4   Title Score at least 48/56 on BERG to decrease fall risk.    Time 8    Period Weeks    Status New    Target Date 07/23/21      PT  LONG TERM GOAL #5   Title Patient will ambulate x at least 400' with LRAD, MI, to increase his community mobility.    Baseline 100' slow, RW    Time 8    Period Weeks    Status New    Target Date 07/23/21                   Plan - 06/03/21 1511     Clinical Impression Statement Pt with some fatigue while completing today's interventions. Cue not to allow LE to push against table with sit to stands. Some RLE weakness present with seated LAQs.  Pt is slow very slow mobility requiring increase time to complete activities. No report of increase pain during session.    Personal Factors and Comorbidities Comorbidity 1    Examination-Activity Limitations Bathing;Locomotion Level;Transfers;Bed Mobility;Reach Overhead;Sit;Carry;Squat;Dressing;Stairs;Hygiene/Grooming;Toileting;Lift;Stand    Examination-Participation Restrictions Meal Prep;Cleaning;Driving;Interpersonal Relationship;Shop;Laundry    Stability/Clinical Decision Making Evolving/Moderate complexity    Rehab Potential Good    PT Next Visit Plan Functional strength and endurance             Patient will benefit from skilled therapeutic intervention in order to improve the following deficits and impairments:  Abnormal gait, Decreased coordination, Difficulty walking, Decreased endurance, Decreased activity tolerance, Impaired flexibility, Decreased balance, Decreased mobility, Decreased strength, Postural dysfunction  Visit Diagnosis: Muscle weakness (generalized)  Unsteadiness on feet  Difficulty in walking, not elsewhere classified     Problem List Patient Active Problem List   Diagnosis Date Noted   Gross hematuria 04/07/2021   Status 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 ([place]) 04/07/2021   Aspiration pneumonia of right lower lobe ([place]) 04/07/2021   Pericardial effusion 04/07/2021   Acute respiratory failure with hypoxia ([place])    Acute metabolic encephalopathy 88/50/2774   Seizures ([place]) 04/01/2021    Neutropenia ([place]) 12/17/2020   Brain mass 08/24/2020   Brain metastasis ([place]) 08/23/2020   Encounter for antineoplastic immunotherapy 04/02/2020   Chemotherapy induced neutropenia ([place]) 02/28/2020   Malignant neoplasm of bronchus of right upper lobe ([place]) 01/03/2020   Encounter for antineoplastic chemotherapy 01/03/2020   Goals of  care, counseling/discussion 01/03/2020   Malnutrition of moderate degree 12/26/2019   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[place]) 12/25/2019   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 [place], [place], [address] Phone: [phone]   Fax:  [phone]  Name: Winton Offord MRN: [id number] Date of Birth: Nov 10, 1955

## 2021-06-04 ENCOUNTER — Encounter: Payer: Self-pay | Admitting: Speech Pathology

## 2021-06-04 ENCOUNTER — Telehealth: Payer: Self-pay | Admitting: Internal Medicine

## 2021-06-04 NOTE — Telephone Encounter (Signed)
Scheduled per los, patient has been called and notified. 

## 2021-06-04 NOTE — Therapy (Signed)
[place] [place]. [place], [place], [address] Phone: [phone]   Fax:  [phone]  Occupational Therapy Evaluation  Patient Details  Name: [name] [name] MRN: [id number] Date of Birth: 10/13/1955 Referring Provider (OT): Cecil Cobbs, MD (oncology)   Encounter Date: 06/03/2021   OT End of Session - 06/03/21 1700     Visit Number 1    Number of Visits 7    Date for OT Re-Evaluation 08/02/21    Authorization Type [employer]    Authorization Time Period Max VL: 27 OT/PT/ST combined    Authorization - Visit Number 5    Authorization - Number of Visits 22    OT Start Time 1605    OT Stop Time 1650    OT Time Calculation (min) 45 min    Activity Tolerance Patient tolerated treatment well            Past Medical History:  Diagnosis Date   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[place]) 12/25/2019   Cancer ([place])    COPD (chronic obstructive pulmonary disease) ([place]) 11/02/2007   Qualifier: Diagnosis of  By: Melvyn Novas MD, Christena Deem    Hypertension 12/24/2019   Iron deficiency anemia 08/23/2007   Qualifier: Diagnosis of  By: Jim Like      Past Surgical History:  Procedure Laterality Date   APPLICATION OF CRANIAL NAVIGATION N/A 08/30/2020   Procedure: APPLICATION OF CRANIAL NAVIGATION;  Surgeon: Consuella Lose, MD;  Location: [facility];  Service: Neurosurgery;  Laterality: N/A;   CRANIOTOMY Left 08/30/2020   Procedure: Stereotactic left frontoparietal craniectomy for resection of tumor with brainlab;  Surgeon: Consuella Lose, MD;  Location: [facility];  Service: Neurosurgery;  Laterality: Left;   ENDOBRONCHIAL ULTRASOUND N/A 12/27/2019   Procedure: ENDOBRONCHIAL ULTRASOUND;  Surgeon: Laurin Coder, MD;  [facility];  Service: Endoscopy;  Laterality: N/A;   FINE NEEDLE ASPIRATION  12/27/2019   Procedure: FINE NEEDLE ASPIRATION;  Surgeon: Laurin Coder, MD;  [facility];  Service: Endoscopy;;   IR IMAGING  GUIDED PORT INSERTION  11/30/2020   VIDEO BRONCHOSCOPY N/A 12/27/2019   Procedure: VIDEO BRONCHOSCOPY WITHOUT FLUORO;  Surgeon: Laurin Coder, MD;  [facility];  Service: Endoscopy;  Laterality: N/A;    There were no vitals filed for this visit.   Subjective Assessment - 06/03/21 1610     Subjective  Pt arrives to session w/ primary concerns related to decreased activity tolerance 2/2 NSCLC, adenocarcinoma w/ development of brain metastasis in April 2022. Pt states he has an aide and people from his church community currently coming in a few days/week to assist him at home (cleaning, bringing groceries, medication assist, etc.). Pt also states he feels weaker/less coordinated on his R side after most recent ED visit in November 2022 and reports sometimes dropping objects.    Pertinent History Non-small cell lung cancer, adenocarcinoma (dx August 2021) s/p chemoradiation; now on immunotherapy w/ Imfin[facility]. Developed brain metastasis in April 2022 w/ tumor resection 08/30/20. Presented to ED 04/01/21 and was found to have complex partial seizures 2/2 brain metastasis.    Patient Stated Goals Be able to move around better; improve functional transfers to R side    Currently in Pain? No/denies             [facility] OT Assessment - 06/03/21 1618       Assessment   Medical Diagnosis NSCLC, adenocarcinoma; seizure activity and progressive brain metastasis    Referring Provider (OT) Cecil Cobbs, MD (  oncology)    Hand Dominance Left    Next MD Visit 07/01/21    Prior Therapy No      Balance Screen   Has the patient fallen in the past 6 months Yes    How many times? 1      Home  Environment   Available Help at Discharge Available PRN/intermittently   Church friend helps w/ medications and finances; aide helps w/ cleaning/cooking/BADLs   Type of Home Aartment    Home Access Other (Comment)   1-step threshold   Home Layout One level    Bathroom Marine scientist - 2 wheels;Tub bench;Wheelchair - manual;Bedside commode;Grab bars - tub/shower      Prior Function   Level of Independence Independent with basic ADLs;Independent with household mobility with device;Needs assistance with homemaking    Leisure Watch TV      ADL   Toilet Transfer Modified independent   Reports using standard toilet during the day and BSC at nighttime   Tub/Shower Transfer Modified independent   Difficulty managing RLE w/ transfers in/out of tub/shower combo   Tub/Shower Transfer Equipment Transfer tub bench;Grab bars;Increased time    ADL comments Reports Mod I for most BADLs. To be further assessed. States frequent difficulty w/ orientation of clothing when donning; denies difficulty w/ clothing manipulatives, self-feeding, grooming      IADL   Shopping Completely unable to shop    Light Housekeeping Needs help with all home maintenance tasks    Meal Prep Able to complete simple warm meal prep;Able to complete simple cold meal and snack prep    Actor independently on public transportation    Medication Management Takes responsibility if medication is prepared in advance in seperate dosage    Financial Management Requires assistance      Mobility   Mobility Status Needs assist;History of falls    Mobility Status Comments Standard FWW      Vision - History   Additional Comments Pt reports no difficulties w/ vision; reading acuity approx 20/100 per gross assessment      Activity Tolerance   Activity Tolerance Comments Reports decreased activity tolerance      Cognition   Overall Cognitive Status Cognition to be further assessed in functional context PRN    Area of Impairment Attention;Memory;Safety/judgement;Awareness;Problem solving      Sensation   Light Touch Appears Intact    Additional Comments Pt reports occasional numbness on his R side      Coordination   Coordination and Movement Description Decreased speed of  movement. LUE coordination > RUE (unable to oppose thumb to each finger w/ R hand)    Finger Nose Finger Test WNL    9 Hole Peg Test Right;Left    Right 9 Hole Peg Test 41 sec    Left 9 Hole Peg Test 36 sec      ROM / Strength   AROM / PROM / Strength AROM      AROM   Overall AROM  Within functional limits for tasks performed    AROM Assessment Site Shoulder;Elbow;Forearm;Wrist      Hand Function   Right Hand Grip (lbs) 25 lbs    Left Hand Grip (lbs) 42 lbs             OT Education - 06/03/21 1616     Education Details Education provided on role and purpose of OT, as well as potential interventions and goals for therapy  based on initial evaluation findings.    Person(s) Educated Patient    Methods Explanation    Comprehension Verbalized understanding             OT Short Term Goals - 06/03/21 1711       OT SHORT TERM GOAL #1   Title Pt will verbalize or demonstrate understanding of energy conservation strategies to incorporate into BADLs    Baseline Reports fatigue/decreased activity tolerance    Time 3    Period Weeks    Status New    Target Date 06/28/21             OT Long Term Goals - 06/03/21 1713       OT LONG TERM GOAL #1   Title Pt will improve R hand (dominant side) grip strength by at least 9 lbs to decrease drops during IADL tasks    Baseline 25 lbs w/ RUE (LUE 42 lbs)    Time 6    Period Weeks    Status New    Target Date 07/19/21      OT LONG TERM GOAL #2   Title Pt will safely demonstrate simulated tub/shower transfer w/ Mod I, incorporating compensatory strategies and/or DME/AE prn    Baseline Reports difficulty w/ tub/shower transfer due to RLE weakness    Time 6    Period Weeks    Status New    Target Date 07/19/21      OT LONG TERM GOAL #3   Title Pt will demonstrate independence w/ recommended AE to improve safety w/ BADLs (reacher, long-handled sponge, etc.)    Baseline Decreased activity tolerance and limitations w/ cognition  impacting safety w/ ADLs    Time 6    Period Weeks    Status New    Target Date 07/19/21             Plan - 06/03/21 1657     Clinical Impression Statement Pt is a 66 y/o male who presents to OP OT due to recent admission to [facility] with seizure activity and progressive brain metastasis in November 2022; pt diagnosed w/ non-small cell lung cancer, adenocarcimona in August 2021, developing brain metastasis in April 2022 w/ tumor resection 08/30/20. PMH includes COPD, HTN, and iron deficiency anemia. Pt currently lives alone in an apartment, but has an aide and members from his church providing assist a few days/week and prn. Pt will benefit from skilled occupational therapy services to address strength and coordination, energy conservation, functional cognition, and safety awareness, including introduction of compensatory strategies/AE prn improve safety and participation in ADLs considering pt's current status of living alone.    OT Occupational Profile and History Detailed Assessment- Review of Records and additional review of physical, cognitive, psychosocial history related to current functional performance    Occupational performance deficits (Please refer to evaluation for details): ADL's;IADL's;Social Participation;Leisure    Body Structure / Function / Physical Skills ADL;Decreased knowledge of use of DME;Strength;Balance;Dexterity;Body mechanics;UE functional use;Endurance;IADL;Coordination;FMC;Mobility    Cognitive Skills Memory;Problem Solve;Perception;Attention    Psychosocial Skills Environmental  Adaptations    Rehab Potential Fair    Clinical Decision Making Several treatment options, min-mod task modification necessary    Comorbidities Affecting Occupational Performance: Presence of comorbidities impacting occupational performance    Modification or Assistance to Complete Evaluation  Min-Moderate modification of tasks or assist with assess necessary to complete eval     OT Frequency 1x / week    OT Duration 6 weeks  Decreased frequency and duration due to insurance-based VL   OT Treatment/Interventions Self-care/ADL training;DME and/or AE instruction;Therapeutic activities;Therapeutic exercise;Cognitive remediation/compensation;Functional Mobility Training;Neuromuscular education;Energy conservation;Manual Therapy;Patient/family education    Plan Introduce energy conservation strategies, including AE prn    Recommended Other Services Pt currently receiving PT/ST at this clinic    Consulted and Agree with Plan of Care Patient            Patient will benefit from skilled therapeutic intervention in order to improve the following deficits and impairments:   Body Structure / Function / Physical Skills: ADL, Decreased knowledge of use of DME, Strength, Balance, Dexterity, Body mechanics, UE functional use, Endurance, IADL, Coordination, FMC, Mobility Cognitive Skills: Memory, Problem Solve, Perception, Attention Psychosocial Skills: Environmental  Adaptations   Visit Diagnosis: Frontal lobe and executive function deficit  Muscle weakness (generalized)  Other lack of coordination  Other abnormalities of gait and mobility   Problem List Patient Active Problem List   Diagnosis Date Noted   Gross hematuria 04/07/2021   Status 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 ([place]) 04/07/2021   Aspiration pneumonia of right lower lobe ([place]) 04/07/2021   Pericardial effusion 04/07/2021   Acute respiratory failure with hypoxia ([facility])    Acute metabolic encephalopathy 94/58/5929   Seizures ([place]) 04/01/2021   Neutropenia ([place]) 12/17/2020   Brain mass 08/24/2020   Brain metastasis ([place]) 08/23/2020   Encounter for antineoplastic immunotherapy 04/02/2020   Chemotherapy induced neutropenia ([place]) 02/28/2020   Malignant neoplasm of bronchus of right upper lobe ([place]) 01/03/2020   Encounter for antineoplastic chemotherapy 01/03/2020   Goals of care,  counseling/discussion 01/03/2020   Malnutrition of moderate degree 12/26/2019   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[place]) 12/25/2019   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 [place], [place], [address] Phone: [phone]   Fax:  [phone]  Name: [name] [name] MRN: [id number] Date of Birth: January 04, 1956

## 2021-06-04 NOTE — Therapy (Signed)
[place]. [place], [place], [address] Phone: [phone]   Fax:  [phone]  Speech Language Pathology Evaluation  Patient Details  Name: [name] [name] MRN: [id number] Date of Birth: 1955-11-30 Referring Provider (SLP): Ventura Sellers MD   Encounter Date: 06/03/2021   End of Session - 06/04/21 0900     Visit Number 1    Number of Visits 8    Date for SLP Re-Evaluation 09/01/21    SLP Start Time 1520    SLP Stop Time  1610    SLP Time Calculation (min) 50 min    Activity Tolerance Patient tolerated treatment well;Patient limited by fatigue             Past Medical History:  Diagnosis Date   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[place]) 12/25/2019   Cancer ([place])    COPD (chronic obstructive pulmonary disease) ([place]) 11/02/2007   Qualifier: Diagnosis of  By: Melvyn Novas MD, Christena Deem    Hypertension 12/24/2019   Iron deficiency anemia 08/23/2007   Qualifier: Diagnosis of  By: Jim Like      Past Surgical History:  Procedure Laterality Date   APPLICATION OF CRANIAL NAVIGATION N/A 08/30/2020   Procedure: APPLICATION OF CRANIAL NAVIGATION;  Surgeon: Consuella Lose, MD;  Location: [facility];  Service: Neurosurgery;  Laterality: N/A;   CRANIOTOMY Left 08/30/2020   Procedure: Stereotactic left frontoparietal craniectomy for resection of tumor with brainlab;  Surgeon: Consuella Lose, MD;  Location: [facility];  Service: Neurosurgery;  Laterality: Left;   ENDOBRONCHIAL ULTRASOUND N/A 12/27/2019   Procedure: ENDOBRONCHIAL ULTRASOUND;  Surgeon: Laurin Coder, MD;  [facility];  Service: Endoscopy;  Laterality: N/A;   FINE NEEDLE ASPIRATION  12/27/2019   Procedure: FINE NEEDLE ASPIRATION;  Surgeon: Laurin Coder, MD;  [facility];  Service: Endoscopy;;   IR IMAGING GUIDED PORT INSERTION  11/30/2020   VIDEO BRONCHOSCOPY N/A 12/27/2019   Procedure: VIDEO BRONCHOSCOPY WITHOUT FLUORO;  Surgeon: Laurin Coder, MD;  [facility];  Service: Endoscopy;  Laterality: N/A;    There were no vitals filed for this visit.   Subjective Assessment - 06/04/21 0853     Subjective Pt was pleasant and cooperative throughout evaluation.    Currently in Pain? No/denies                SLP Evaluation [facility] - 06/03/21 1525       SLP Visit Information   SLP Received On 06/03/21    Referring Provider (SLP) Ventura Sellers MD    Onset Date 04/01/21    Medical Diagnosis Brain Metastasis, Seizure      Subjective   Patient/Family Stated Goal Could not provide      General Information   HPI 66yo male admitted 04/01/21, found acting abnormally and confused/aphasic. Seizure in ED. Intubated 11/15-16/22. PMH: tracheal mass, HTN, anemia, bullous emphysema, COPD, non-small cell lung cancer s/p chemo, crani due to brain tumor, right side deficits. CTHead = hypodensity in Left parietal and occipital lobes. MRI = 1.4cm mets in parasaggital Left parietal lobe.      Balance Screen   Has the patient fallen in the past 6 months Yes    How many times? 1      Prior Functional Status   Cognitive/Linguistic Baseline Baseline deficits    Baseline deficit details 19/30 on SL[facility]MS from most recent hospital stay    Type of [place]  Available Support Friend(s)    Education [employer]    Vocation Retired      Scientist, physiological Comprehension   Overall Auditory Comprehension Impaired    Conversation Simple    [employer] Attention;Processing speed;Working Armed forces training and education officer Expression   Overall Verbal Expression Impaired    Level of Generative/Spontaneous Radiation protection practitioner Other (comment)   Processing   Other Verbal Expression Comments Difficulty word finding and possibly phonemic paraphasias noted in speech. Difficult to understand at times due to dysarthria.      Motor  Speech   Overall Motor Speech Impaired    Articulation Impaired    Level of Impairment Word    Intelligibility Intelligibility reduced    Word 75-100% accurate    Phrase 75-100% accurate    Sentence 50-74% accurate    Conversation 50-74% accurate   Some disfluency noted during assessment   Effective Techniques Increased vocal intensity      Standardized Assessments   Standardized Assessments  Cognitive Linguistic Quick Test      Cognitive Linguistic Quick Test (Ages 18-69)   Attention Moderate    Memory Moderate    Executive Function Severe    Language Mild    Visuospatial Skills Severe    Severity Rating Total 9    Composite Severity Rating 8.2                             SLP Education - 06/04/21 0900     Education Details Cognitive-communication deficit; SLP role    Person(s) Educated Patient    Methods Explanation;Demonstration    Comprehension Verbal cues required;Verbalized understanding;Need further instruction              SLP Short Term Goals - 06/04/21 0915       SLP SHORT TERM GOAL #1   Title Patient will identify a problem given a scenario with 80% accuracy given modA in 4 weeks.    Time 4    Period Weeks    Status New    Target Date 07/02/21      SLP SHORT TERM GOAL #2   Title Pt will comprehend functional memory or visual aids for recall of important information during structured conversations with modA.    Time 4    Period Weeks    Status New    Target Date 07/02/21              SLP Long Term Goals - 06/04/21 0916       SLP LONG TERM GOAL #1   Title Patient will identify a problem given a scenario with 80% accuracy given minA in 8 weeks.    Time 8    Period Weeks    Status New    Target Date 07/30/21      SLP LONG TERM GOAL #2   Title Pt will comprehend functional memory or visual aids for recall of important information during structured conversations with minA in 8 weeks.    Time 8    Period Weeks    Status  New    Target Date 07/30/21              Plan - 06/04/21 0901     Clinical Impression Statement Pt is a 66 yo male who presents to OP ST for evaluation post admittance into hospital due to confusion and aphasia 2/2  to seziures. Pt unaccompanied by family for evaluation and was an inconsistent historian. Pt had difficulty explaining level of supervision he receives at home. Pt reported he does feel like this "thinking" has changed. He reports that someone manages his medication and does cleaning; however, he is responsible for "making food". Reportedly, he does not eat breakfast or lunch, but does eat dinner. Throughout evaluation, SLP had to prompt for repetition of words/sentences due decreased intelligibility. When vocal intensity increased, his intelligibility increased. SLP assessed pt using the CLQT to determine extent of cognitive deficits. Pt scored the following severity levels: Attention - moderate; Memory - moderate; Executive Functions - severe; Language - mild; Visuospatial skills - severe; Clock Drawing - severe. Overall, pt scored a 1.8 indicating a   "moderate" severity level (range: 2.4-1.5). SLP rec skilled speech services to address problem solving and memory strategies to increase safety at home. Pt in agreement.    Speech Therapy Frequency 1x /week    Duration 8 weeks    Treatment/Interventions SLP instruction and feedback;Cueing hierarchy;Environmental controls;Language facilitation;Functional tasks;Compensatory strategies;Compensatory techniques;Internal/external aids;Multimodal communcation approach;Patient/family education    Potential to Achieve Goals Fair    Potential Considerations Ability to learn/carryover information;Severity of impairments;Family/community support    Consulted and Agree with Plan of Care Patient             Patient will benefit from skilled therapeutic intervention in order to improve the following deficits and impairments:   Cognitive  communication deficit    Problem List Patient Active Problem List   Diagnosis Date Noted   Gross hematuria 04/07/2021   Status 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 ([place]) 04/07/2021   Aspiration pneumonia of right lower lobe ([place]) 04/07/2021   Pericardial effusion 04/07/2021   Acute respiratory failure with hypoxia ([place])    Acute metabolic encephalopathy 17/79/3903   Seizures ([place]) 04/01/2021   Neutropenia ([place]) 12/17/2020   Brain mass 08/24/2020   Brain metastasis ([place]) 08/23/2020   Encounter for antineoplastic immunotherapy 04/02/2020   Chemotherapy induced neutropenia ([place]) 02/28/2020   Malignant neoplasm of bronchus of right upper lobe ([place]) 01/03/2020   Encounter for antineoplastic chemotherapy 01/03/2020   Goals of care, counseling/discussion 01/03/2020   Malnutrition of moderate degree 12/26/2019   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[place]) 12/25/2019   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 [place], [place], [address] Phone: [phone]   Fax:  [phone]  Name: Kailin Leu MRN: [id number] Date of Birth: April 14, 1956

## 2021-06-05 ENCOUNTER — Ambulatory Visit: Payer: Medicaid Other | Admitting: Physical Therapy

## 2021-06-05 ENCOUNTER — Encounter: Payer: Self-pay | Admitting: Physical Therapy

## 2021-06-05 ENCOUNTER — Other Ambulatory Visit: Payer: Self-pay

## 2021-06-05 DIAGNOSIS — R41844 Frontal lobe and executive function deficit: Secondary | ICD-10-CM

## 2021-06-05 DIAGNOSIS — M6281 Muscle weakness (generalized): Secondary | ICD-10-CM

## 2021-06-05 DIAGNOSIS — R41841 Cognitive communication deficit: Secondary | ICD-10-CM

## 2021-06-05 NOTE — Therapy (Signed)
[place]. [place], [place], [address] Phone: [phone]   Fax:  [phone]  Physical Therapy Treatment  Patient Details  Name: [name] [name] MRN: [id number] Date of Birth: June 19, 1955 No data recorded  Encounter Date: 06/05/2021   PT End of Session - 06/05/21 1508     Visit Number 4    Date for PT Re-Evaluation 07/23/21    PT Start Time 1430    PT Stop Time 1512    PT Time Calculation (min) 42 min    Activity Tolerance Patient tolerated treatment well;Patient limited by fatigue    Behavior During Therapy [facility] for tasks assessed/performed             Past Medical History:  Diagnosis Date   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[place]) 12/25/2019   Cancer ([place])    COPD (chronic obstructive pulmonary disease) ([place]) 11/02/2007   Qualifier: Diagnosis of  By: Melvyn Novas MD, Christena Deem    Hypertension 12/24/2019   Iron deficiency anemia 08/23/2007   Qualifier: Diagnosis of  By: Jim Like      Past Surgical History:  Procedure Laterality Date   APPLICATION OF CRANIAL NAVIGATION N/A 08/30/2020   Procedure: APPLICATION OF CRANIAL NAVIGATION;  Surgeon: Consuella Lose, MD;  Location: [facility];  Service: Neurosurgery;  Laterality: N/A;   CRANIOTOMY Left 08/30/2020   Procedure: Stereotactic left frontoparietal craniectomy for resection of tumor with brainlab;  Surgeon: Consuella Lose, MD;  Location: [facility];  Service: Neurosurgery;  Laterality: Left;   ENDOBRONCHIAL ULTRASOUND N/A 12/27/2019   Procedure: ENDOBRONCHIAL ULTRASOUND;  Surgeon: Laurin Coder, MD;  [facility];  Service: Endoscopy;  Laterality: N/A;   FINE NEEDLE ASPIRATION  12/27/2019   Procedure: FINE NEEDLE ASPIRATION;  Surgeon: Laurin Coder, MD;  [facility];  Service: Endoscopy;;   IR IMAGING GUIDED PORT INSERTION  11/30/2020   VIDEO BRONCHOSCOPY N/A 12/27/2019   Procedure: VIDEO BRONCHOSCOPY WITHOUT FLUORO;  Surgeon: Laurin Coder,  MD;  [facility];  Service: Endoscopy;  Laterality: N/A;    There were no vitals filed for this visit.   Subjective Assessment - 06/05/21 1429     Subjective "I feel all right" no falls    Currently in Pain? No/denies                               [facility] Adult PT Treatment/Exercise - 06/05/21 0001       Ambulation/Gait   Ambulation/Gait Yes    Ambulation/Gait Assistance 6: Modified independent (Device/Increase time)    Ambulation Distance (Feet) 375 Feet    Assistive device Rolling walker    Gait Pattern Step-through pattern;Decreased step length - right;Decreased step length - left;Decreased hip/knee flexion - right;Shuffle      High Level Balance   High Level Balance Activities Side stepping      Knee/Hip Exercises: Aerobic   Nustep L4 x 6 min      Knee/Hip Exercises: Machines for Strengthening   Cybex Knee Flexion 25lb 2x10      Knee/Hip Exercises: Standing   Forward Step Up Both;2 sets;5 reps;Hand Hold: 2      Knee/Hip Exercises: Seated   Sit to Sand 2 sets;10 reps      Knee/Hip Exercises: Supine   Quad Sets --   holding red ball  PT Short Term Goals - 06/05/21 1512       PT SHORT TERM GOAL #1   Title I with basic HEP    Status Achieved               PT Long Term Goals - 06/05/21 1512       PT LONG TERM GOAL #2   Title Increase functoinal strength to allow 5 x sit to stand in < 20 seconds    Status On-going                   Plan - 06/05/21 1508     Clinical Impression Statement Pt did well progressing with gait and functional activities. Constant cues needed for sequencing with step ups. Min assist needed at times with RW management avoiding obstacles in clinic. Bilateral LE fatigue and burning reported with seated leg curls and extensions. No issues with side steps.    Examination-Activity Limitations Bathing;Locomotion Level;Transfers;Bed Mobility;Reach  Overhead;Sit;Carry;Squat;Dressing;Stairs;Hygiene/Grooming;Toileting;Lift;Stand    Stability/Clinical Decision Making Evolving/Moderate complexity    Rehab Potential Good    PT Frequency 2x / week    PT Duration 8 weeks    PT Treatment/Interventions ADLs/Self Care Home Management;Neuromuscular re-education;Balance training;Therapeutic exercise;Therapeutic activities;Functional mobility training;Stair training;Gait training;Passive range of motion;Manual techniques    PT Next Visit Plan Functional strength and endurance             Patient will benefit from skilled therapeutic intervention in order to improve the following deficits and impairments:  Abnormal gait, Decreased coordination, Difficulty walking, Decreased endurance, Decreased activity tolerance, Impaired flexibility, Decreased balance, Decreased mobility, Decreased strength, Postural dysfunction  Visit Diagnosis: Cognitive communication deficit  Muscle weakness (generalized)  Frontal lobe and executive function deficit     Problem List Patient Active Problem List   Diagnosis Date Noted   Gross hematuria 04/07/2021   Status 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 ([place]) 04/07/2021   Aspiration pneumonia of right lower lobe ([place]) 04/07/2021   Pericardial effusion 04/07/2021   Acute respiratory failure with hypoxia ([place])    Acute metabolic encephalopathy 22/97/9892   Seizures ([place]) 04/01/2021   Neutropenia ([place]) 12/17/2020   Brain mass 08/24/2020   Brain metastasis ([place]) 08/23/2020   Encounter for antineoplastic immunotherapy 04/02/2020   Chemotherapy induced neutropenia ([place]) 02/28/2020   Malignant neoplasm of bronchus of right upper lobe ([place]) 01/03/2020   Encounter for antineoplastic chemotherapy 01/03/2020   Goals of care, counseling/discussion 01/03/2020   Malnutrition of moderate degree 12/26/2019   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[place]) 12/25/2019   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 [place], [place], [address] Phone: [phone]   Fax:  [phone]  Name: Cheron Pasquarelli MRN: [id number] Date of Birth: 1956-05-18

## 2021-06-10 ENCOUNTER — Other Ambulatory Visit: Payer: Self-pay

## 2021-06-10 ENCOUNTER — Ambulatory Visit (HOSPITAL_COMMUNITY)
Admission: RE | Admit: 2021-06-10 | Discharge: 2021-06-10 | Disposition: A | Discharge status: Home / Self Care | Payer: Medicaid Other | Source: Ambulatory Visit | Attending: Internal Medicine | Admitting: Internal Medicine

## 2021-06-10 ENCOUNTER — Ambulatory Visit: Payer: Medicaid Other | Admitting: Physical Therapy

## 2021-06-10 ENCOUNTER — Encounter: Payer: Self-pay | Admitting: Physical Therapy

## 2021-06-10 DIAGNOSIS — R262 Difficulty in walking, not elsewhere classified: Secondary | ICD-10-CM

## 2021-06-10 DIAGNOSIS — R278 Other lack of coordination: Secondary | ICD-10-CM

## 2021-06-10 DIAGNOSIS — M6281 Muscle weakness (generalized): Secondary | ICD-10-CM

## 2021-06-10 DIAGNOSIS — C3490 Malignant neoplasm of unspecified part of unspecified bronchus or lung: Secondary | ICD-10-CM | POA: Diagnosis present

## 2021-06-10 DIAGNOSIS — R2681 Unsteadiness on feet: Secondary | ICD-10-CM

## 2021-06-10 MED ORDER — IOHEXOL 300 MG/ML  SOLN
75.0000 mL | Freq: Once | INTRAMUSCULAR | Status: AC | PRN
  Administered 2021-06-10: 75 mL via INTRAVENOUS

## 2021-06-10 MED ORDER — SODIUM CHLORIDE (PF) 0.9 % IJ SOLN
INTRAMUSCULAR | Status: AC
  Filled 2021-06-10: qty 50

## 2021-06-10 NOTE — Therapy (Signed)
[place]. [place], [place], [address] Phone: [phone]   Fax:  [phone]  Physical Therapy Treatment  Patient Details  Name: [name] [name] MRN: [id number] Date of Birth: November 09, 1955 No data recorded  Encounter Date: 06/10/2021   PT End of Session - 06/10/21 1539     Visit Number 5    Number of Visits 16    Date for PT Re-Evaluation 07/23/21    PT Start Time 1500    PT Stop Time 1540    PT Time Calculation (min) 40 min    Activity Tolerance Patient tolerated treatment well;Patient limited by fatigue    Behavior During Therapy [facility] for tasks assessed/performed             Past Medical History:  Diagnosis Date   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[place]) 12/25/2019   Cancer ([place])    COPD (chronic obstructive pulmonary disease) ([place]) 11/02/2007   Qualifier: Diagnosis of  By: Melvyn Novas MD, Christena Deem    Hypertension 12/24/2019   Iron deficiency anemia 08/23/2007   Qualifier: Diagnosis of  By: Jim Like      Past Surgical History:  Procedure Laterality Date   APPLICATION OF CRANIAL NAVIGATION N/A 08/30/2020   Procedure: APPLICATION OF CRANIAL NAVIGATION;  Surgeon: Consuella Lose, MD;  Location: [facility];  Service: Neurosurgery;  Laterality: N/A;   CRANIOTOMY Left 08/30/2020   Procedure: Stereotactic left frontoparietal craniectomy for resection of tumor with brainlab;  Surgeon: Consuella Lose, MD;  Location: [facility];  Service: Neurosurgery;  Laterality: Left;   ENDOBRONCHIAL ULTRASOUND N/A 12/27/2019   Procedure: ENDOBRONCHIAL ULTRASOUND;  Surgeon: Laurin Coder, MD;  [facility];  Service: Endoscopy;  Laterality: N/A;   FINE NEEDLE ASPIRATION  12/27/2019   Procedure: FINE NEEDLE ASPIRATION;  Surgeon: Laurin Coder, MD;  [facility];  Service: Endoscopy;;   IR IMAGING GUIDED PORT INSERTION  11/30/2020   VIDEO BRONCHOSCOPY N/A 12/27/2019   Procedure: VIDEO BRONCHOSCOPY WITHOUT FLUORO;   Surgeon: Laurin Coder, MD;  [facility];  Service: Endoscopy;  Laterality: N/A;    There were no vitals filed for this visit.   Subjective Assessment - 06/10/21 1501     Subjective Patient reports no issues. No pain, getting around at home, no falls. Still wants to get RLE stronger.    Pertinent History COPD, HTN, metastatic NSCLC s/p chemo/immunotherapy, craniotomy 4/44 w/p SRS to brain    Currently in Pain? No/denies                               [facility] Adult PT Treatment/Exercise - 06/10/21 0001       Ambulation/Gait   Gait Comments Patietn ambulates iwth RW. Very slow upon arrival, ut demosntrated much quicker pace after treatment activities.      Knee/Hip Exercises: Aerobic   Nustep L3 x 5 minutes.      Knee/Hip Exercises: Standing   Other Standing Knee Exercises B side steps 10 to each side, then side to side stepping with quick feet to facilitated speed of movement and balance, 12 reps, CGA, no unsteadiness, with slightly increased speed when practicing.    Other Standing Knee Exercises Step tap on 4" step. alternating R/L from forward position x 10, then laterally x 10 with each leg, CGA, no unsteadiness noted.      Knee/Hip Exercises: Seated   Marching Strengthening;Both;1 set;10 reps   2# weights. lift onto and  off side of Airex pad.   Sit to Sand 1 set;10 reps;without UE support   Holding 2# ball in BUE.                      PT Short Term Goals - 06/05/21 1512       PT SHORT TERM GOAL #1   Title I with basic HEP    Status Achieved               PT Long Term Goals - 06/10/21 1538       PT LONG TERM GOAL #2   Title Increase functoinal strength to allow 5 x sit to stand in < 20 seconds    Status On-going      PT LONG TERM GOAL #3   Title Improve TUG to < 15 seconds    Baseline 17.95 sec    Time 4    Period Weeks    Status On-going    Target Date 07/23/21                   Plan - 06/10/21  1509     Clinical Impression Statement Pateitn is participating well, reports no pain, still wants to strengthen RLE. Performed activities for BLE strength as well as speed of movement for balance facilitation, Re-assessed TUG-17.95, an improvement of 7 seconds, but still at higher fall risk.    Personal Factors and Comorbidities Comorbidity 1    Comorbidities lung Ca with brain mets    Examination-Activity Limitations Bathing;Locomotion Level;Transfers;Bed Mobility;Reach Overhead;Sit;Carry;Squat;Dressing;Stairs;Hygiene/Grooming;Toileting;Lift;Stand    Examination-Participation Restrictions Meal Prep;Cleaning;Driving;Interpersonal Relationship;Shop;Laundry    Stability/Clinical Decision Making Evolving/Moderate complexity    Clinical Decision Making Moderate    Rehab Potential Good    PT Frequency 2x / week    PT Duration 8 weeks    PT Treatment/Interventions ADLs/Self Care Home Management;Neuromuscular re-education;Balance training;Therapeutic exercise;Therapeutic activities;Functional mobility training;Stair training;Gait training;Passive range of motion;Manual techniques    PT Next Visit Plan Functional strength and endurance    Consulted and Agree with Plan of Care Patient             Patient will benefit from skilled therapeutic intervention in order to improve the following deficits and impairments:  Abnormal gait, Decreased coordination, Difficulty walking, Decreased endurance, Decreased activity tolerance, Impaired flexibility, Decreased balance, Decreased mobility, Decreased strength, Postural dysfunction  Visit Diagnosis: Muscle weakness (generalized)  Other lack of coordination  Unsteadiness on feet  Difficulty in walking, not elsewhere classified     Problem List Patient Active Problem List   Diagnosis Date Noted   Gross hematuria 04/07/2021   Status 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 ([place]) 04/07/2021   Aspiration pneumonia of right lower lobe ([place])  04/07/2021   Pericardial effusion 04/07/2021   Acute respiratory failure with hypoxia ([place])    Acute metabolic encephalopathy 48/54/6270   Seizures ([place]) 04/01/2021   Neutropenia ([place]) 12/17/2020   Brain mass 08/24/2020   Brain metastasis ([place]) 08/23/2020   Encounter for antineoplastic immunotherapy 04/02/2020   Chemotherapy induced neutropenia ([place]) 02/28/2020   Malignant neoplasm of bronchus of right upper lobe ([place]) 01/03/2020   Encounter for antineoplastic chemotherapy 01/03/2020   Goals of care, counseling/discussion 01/03/2020   Malnutrition of moderate degree 12/26/2019   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[place]) 12/25/2019   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 [place], [place], [address] Phone: [phone]   Fax:  [phone]  Name: [name] [name] MRN: [id number] Date of Birth: [birth year]

## 2021-06-11 ENCOUNTER — Ambulatory Visit (INDEPENDENT_AMBULATORY_CARE_PROVIDER_SITE_OTHER): Payer: Medicaid Other | Admitting: Primary Care

## 2021-06-12 ENCOUNTER — Encounter: Payer: Self-pay | Admitting: Physical Therapy

## 2021-06-12 ENCOUNTER — Ambulatory Visit: Payer: Medicaid Other | Admitting: Speech Pathology

## 2021-06-12 ENCOUNTER — Ambulatory Visit: Payer: Medicaid Other | Admitting: Physical Therapy

## 2021-06-12 ENCOUNTER — Encounter: Payer: Self-pay | Admitting: Occupational Therapy

## 2021-06-12 ENCOUNTER — Other Ambulatory Visit: Payer: Self-pay

## 2021-06-12 ENCOUNTER — Ambulatory Visit: Payer: Medicaid Other | Admitting: Occupational Therapy

## 2021-06-12 ENCOUNTER — Encounter: Payer: Self-pay | Admitting: Speech Pathology

## 2021-06-12 DIAGNOSIS — R41841 Cognitive communication deficit: Secondary | ICD-10-CM

## 2021-06-12 DIAGNOSIS — R262 Difficulty in walking, not elsewhere classified: Secondary | ICD-10-CM

## 2021-06-12 DIAGNOSIS — M6281 Muscle weakness (generalized): Secondary | ICD-10-CM

## 2021-06-12 DIAGNOSIS — R2681 Unsteadiness on feet: Secondary | ICD-10-CM

## 2021-06-12 DIAGNOSIS — R41844 Frontal lobe and executive function deficit: Secondary | ICD-10-CM

## 2021-06-12 DIAGNOSIS — R2689 Other abnormalities of gait and mobility: Secondary | ICD-10-CM

## 2021-06-12 DIAGNOSIS — R278 Other lack of coordination: Secondary | ICD-10-CM

## 2021-06-12 NOTE — Therapy (Signed)
[place]. [place], [place], [address] Phone: [phone]   Fax:  [phone]  Physical Therapy Treatment  Patient Details  Name: [name] [name] MRN: [id number] Date of Birth: November 22, 1955 No data recorded  Encounter Date: 06/12/2021   PT End of Session - 06/12/21 1347     Visit Number 6    Date for PT Re-Evaluation 07/23/21    PT Start Time 1300    PT Stop Time 1345    PT Time Calculation (min) 45 min    Activity Tolerance Patient tolerated treatment well    Behavior During Therapy [facility] for tasks assessed/performed             Past Medical History:  Diagnosis Date   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[place]) 12/25/2019   Cancer ([place])    COPD (chronic obstructive pulmonary disease) ([place]) 11/02/2007   Qualifier: Diagnosis of  By: Melvyn Novas MD, Christena Deem    Hypertension 12/24/2019   Iron deficiency anemia 08/23/2007   Qualifier: Diagnosis of  By: Jim Like      Past Surgical History:  Procedure Laterality Date   APPLICATION OF CRANIAL NAVIGATION N/A 08/30/2020   Procedure: APPLICATION OF CRANIAL NAVIGATION;  Surgeon: Consuella Lose, MD;  Location: [place];  Service: Neurosurgery;  Laterality: N/A;   CRANIOTOMY Left 08/30/2020   Procedure: Stereotactic left frontoparietal craniectomy for resection of tumor with brainlab;  Surgeon: Consuella Lose, MD;  Location: [place];  Service: Neurosurgery;  Laterality: Left;   ENDOBRONCHIAL ULTRASOUND N/A 12/27/2019   Procedure: ENDOBRONCHIAL ULTRASOUND;  Surgeon: Laurin Coder, MD;  [facility];  Service: Endoscopy;  Laterality: N/A;   FINE NEEDLE ASPIRATION  12/27/2019   Procedure: FINE NEEDLE ASPIRATION;  Surgeon: Laurin Coder, MD;  [facility];  Service: Endoscopy;;   IR IMAGING GUIDED PORT INSERTION  11/30/2020   VIDEO BRONCHOSCOPY N/A 12/27/2019   Procedure: VIDEO BRONCHOSCOPY WITHOUT FLUORO;  Surgeon: Laurin Coder, MD;  [facility];   Service: Endoscopy;  Laterality: N/A;    There were no vitals filed for this visit.   Subjective Assessment - 06/12/21 1259     Subjective "OK" Shoulders bothering him, thinks he slept he wrong way    Pertinent History COPD, HTN, metastatic NSCLC s/p chemo/immunotherapy, craniotomy 4/44 w/p SRS to brain    Currently in Pain? No/denies                               [facility] Adult PT Treatment/Exercise - 06/12/21 0001       Knee/Hip Exercises: Aerobic   Nustep L3 x 6 minutes.      Knee/Hip Exercises: Machines for Strengthening   Cybex Knee Extension 5lb 2x10    Cybex Knee Flexion 25lb 2x10    Other Machine Seated Rows & Lats 20lb 2x10      Knee/Hip Exercises: Standing   Forward Step Up Both;2 sets;5 reps;Hand Hold: 0;Step Height: 4"    Other Standing Knee Exercises Alt 6in box taps 2x10      Knee/Hip Exercises: Seated   Sit to Sand 2 sets;10 reps;without UE support   OHP yellow ball                      PT Short Term Goals - 06/05/21 1512       PT SHORT TERM GOAL #1   Title I with basic HEP    Status Achieved  PT Long Term Goals - 06/12/21 1310       PT LONG TERM GOAL #2   Title Increase functoinal strength to allow 5 x sit to stand in < 20 seconds    Status Achieved   12.94 sec                  Plan - 06/12/21 1348     Clinical Impression Statement Pt did well overall with a progressed session. he has progressed decreasing his 5 tines sit to stand time. CGA required with alt box taps. Cue for sequencing needed with step ups. Bilateral LE fatigue and burning with curls and e. Fatigue noted with sit to stands.    Personal Factors and Comorbidities Comorbidity 1    Examination-Activity Limitations Bathing;Locomotion Level;Transfers;Bed Mobility;Reach Overhead;Sit;Carry;Squat;Dressing;Stairs;Hygiene/Grooming;Toileting;Lift;Stand    Examination-Participation Restrictions Meal  Prep;Cleaning;Driving;Interpersonal Relationship;Shop;Laundry    Stability/Clinical Decision Making Evolving/Moderate complexity    Rehab Potential Good    PT Frequency 2x / week    PT Treatment/Interventions ADLs/Self Care Home Management;Neuromuscular re-education;Balance training;Therapeutic exercise;Therapeutic activities;Functional mobility training;Stair training;Gait training;Passive range of motion;Manual techniques    PT Next Visit Plan Functional strength and endurance             Patient will benefit from skilled therapeutic intervention in order to improve the following deficits and impairments:  Decreased endurance  Visit Diagnosis: Muscle weakness (generalized)  Unsteadiness on feet  Difficulty in walking, not elsewhere classified     Problem List Patient Active Problem List   Diagnosis Date Noted   Gross hematuria 04/07/2021   Status 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 ([place]) 04/07/2021   Aspiration pneumonia of right lower lobe ([place]) 04/07/2021   Pericardial effusion 04/07/2021   Acute respiratory failure with hypoxia ([place])    Acute metabolic encephalopathy 17/00/1749   Seizures ([place]) 04/01/2021   Neutropenia ([place]) 12/17/2020   Brain mass 08/24/2020   Brain metastasis ([place]) 08/23/2020   Encounter for antineoplastic immunotherapy 04/02/2020   Chemotherapy induced neutropenia ([place]) 02/28/2020   Malignant neoplasm of bronchus of right upper lobe ([place]) 01/03/2020   Encounter for antineoplastic chemotherapy 01/03/2020   Goals of care, counseling/discussion 01/03/2020   Malnutrition of moderate degree 12/26/2019   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[place]) 12/25/2019   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 [place], [place], [address] Phone: [phone]   Fax:  [phone]  Name: [name] [name] MRN: [id number] Date of Birth: 09/09/1955

## 2021-06-12 NOTE — Therapy (Signed)
[place]. [place], [place], [address] Phone: [phone]   Fax:  [phone]  Speech Language Pathology Treatment  Patient Details  Name: [name] [name] MRN: [id number] Date of Birth: [birth year] Referring Provider (SLP): Ventura Sellers MD   Encounter Date: 06/12/2021   End of Session - 06/12/21 1349     Visit Number 2    Number of Visits 8    Date for SLP Re-Evaluation 09/01/21    SLP Start Time 55    SLP Stop Time  1425    SLP Time Calculation (min) 40 min    Activity Tolerance Patient tolerated treatment well;Patient limited by fatigue             Past Medical History:  Diagnosis Date   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[place]) 12/25/2019   Cancer ([place])    COPD (chronic obstructive pulmonary disease) ([place]) 11/02/2007   Qualifier: Diagnosis of  By: Melvyn Novas MD, Christena Deem    Hypertension 12/24/2019   Iron deficiency anemia 08/23/2007   Qualifier: Diagnosis of  By: Jim Like      Past Surgical History:  Procedure Laterality Date   APPLICATION OF CRANIAL NAVIGATION N/A 08/30/2020   Procedure: APPLICATION OF CRANIAL NAVIGATION;  Surgeon: Consuella Lose, MD;  Location: [place];  Service: Neurosurgery;  Laterality: N/A;   CRANIOTOMY Left 08/30/2020   Procedure: Stereotactic left frontoparietal craniectomy for resection of tumor with brainlab;  Surgeon: Consuella Lose, MD;  Location: [place];  Service: Neurosurgery;  Laterality: Left;   ENDOBRONCHIAL ULTRASOUND N/A 12/27/2019   Procedure: ENDOBRONCHIAL ULTRASOUND;  Surgeon: Laurin Coder, MD;  [facility];  Service: Endoscopy;  Laterality: N/A;   FINE NEEDLE ASPIRATION  12/27/2019   Procedure: FINE NEEDLE ASPIRATION;  Surgeon: Laurin Coder, MD;  [facility];  Service: Endoscopy;;   IR IMAGING GUIDED PORT INSERTION  11/30/2020   VIDEO BRONCHOSCOPY N/A 12/27/2019   Procedure: VIDEO BRONCHOSCOPY WITHOUT FLUORO;  Surgeon: Laurin Coder, MD;  [facility];  Service: Endoscopy;  Laterality: N/A;    There were no vitals filed for this visit.   Subjective Assessment - 06/12/21 1347     Subjective "my neck has been hurting."    Currently in Pain? Yes    Pain Score 7     Pain Location Neck    Pain Orientation Right    Pain Descriptors / Indicators Sharp    Pain Type Acute pain    Pain Onset Today    Aggravating Factors  Turning neck to left    Pain Relieving Factors tylenol                   ADULT SLP TREATMENT - 06/12/21 1404       General Information   Behavior/Cognition Alert;Cooperative      Treatment Provided   Treatment provided Cognitive-Linquistic      Cognitive-Linquistic Treatment   Treatment focused on Cognition    Skilled Treatment Completed BNT. Test was discontinued at item 50 (missed 8 items in a row). SLP suspects this may due to education (as pt reported he did not know what that was). Completed problem solving exercise. Pt was able to provide a solution to all questions; however, was not always the "best"( most obvious/  safest) solution. Pt was able to indentify the solution, but had difficulty solving. Pt reports he has good neighbors that could be helpful.      Assessment / Recommendations / Plan  Plan Continue with current plan of care      Progression Toward Goals   Progression toward goals Progressing toward goals                SLP Short Term Goals - 06/04/21 0915       SLP SHORT TERM GOAL #1   Title Patient will identify a problem given a scenario with 80% accuracy given modA in 4 weeks.    Time 4    Period Weeks    Status New    Target Date 07/02/21      SLP SHORT TERM GOAL #2   Title Pt will comprehend functional memory or visual aids for recall of important information during structured conversations with modA.    Time 4    Period Weeks    Status New    Target Date 07/02/21              SLP Long Term Goals - 06/04/21 0916       SLP  LONG TERM GOAL #1   Title Patient will identify a problem given a scenario with 80% accuracy given minA in 8 weeks.    Time 8    Period Weeks    Status New    Target Date 07/30/21      SLP LONG TERM GOAL #2   Title Pt will comprehend functional memory or visual aids for recall of important information during structured conversations with minA in 8 weeks.    Time 8    Period Weeks    Status New    Target Date 07/30/21              Plan - 06/12/21 1349     Clinical Impression Statement See tx note. Pt clarified he has a CNA that comes in x3/week who stays about 1-2 hours. Continue with current POC.    Speech Therapy Frequency 1x /week    Duration 8 weeks    Treatment/Interventions SLP instruction and feedback;Cueing hierarchy;Environmental controls;Language facilitation;Functional tasks;Compensatory strategies;Compensatory techniques;Internal/external aids;Multimodal communcation approach;Patient/family education    Potential to Achieve Goals Fair    Potential Considerations Ability to learn/carryover information;Severity of impairments;Family/community support    Consulted and Agree with Plan of Care Patient             Patient will benefit from skilled therapeutic intervention in order to improve the following deficits and impairments:   Cognitive communication deficit    Problem List Patient Active Problem List   Diagnosis Date Noted   Gross hematuria 04/07/2021   Status 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 ([place]) 04/07/2021   Aspiration pneumonia of right lower lobe ([place]) 04/07/2021   Pericardial effusion 04/07/2021   Acute respiratory failure with hypoxia ([place])    Acute metabolic encephalopathy 08/65/7846   Seizures ([place]) 04/01/2021   Neutropenia ([place]) 12/17/2020   Brain mass 08/24/2020   Brain metastasis ([place]) 08/23/2020   Encounter for antineoplastic immunotherapy 04/02/2020   Chemotherapy induced neutropenia ([place]) 02/28/2020   Malignant neoplasm of  bronchus of right upper lobe ([place]) 01/03/2020   Encounter for antineoplastic chemotherapy 01/03/2020   Goals of care, counseling/discussion 01/03/2020   Malnutrition of moderate degree 12/26/2019   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[place]) 12/25/2019   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 [place], [place], [address] Phone: [phone]   Fax:  [phone]   Name: [name] [name] MRN: [id number] Date of Birth: Oct 04, 1955

## 2021-06-13 NOTE — Therapy (Signed)
[place]. [place], [place], [address] Phone: [phone]   Fax:  [phone]  Occupational Therapy Treatment  Patient Details  Name: [name] [name] MRN: [id number] Date of Birth: 10/02/1955 Referring Provider (OT): Cecil Cobbs, MD (oncology)   Encounter Date: 06/12/2021   OT End of Session - 06/12/21 1453     Visit Number 2    Number of Visits 7    Date for OT Re-Evaluation 08/02/21    Authorization Type [employer]    Authorization Time Period Max VL: 27 OT/PT/ST combined    Authorization - Visit Number 10    Authorization - Number of Visits 27   Max/year combined   OT Start Time 3419    OT Stop Time 1530    OT Time Calculation (min) 44 min    Activity Tolerance Patient tolerated treatment well            Past Medical History:  Diagnosis Date   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[place]) 12/25/2019   Cancer ([place])    COPD (chronic obstructive pulmonary disease) ([place]) 11/02/2007   Qualifier: Diagnosis of  By: Melvyn Novas MD, Christena Deem    Hypertension 12/24/2019   Iron deficiency anemia 08/23/2007   Qualifier: Diagnosis of  By: Jim Like      Past Surgical History:  Procedure Laterality Date   APPLICATION OF CRANIAL NAVIGATION N/A 08/30/2020   Procedure: APPLICATION OF CRANIAL NAVIGATION;  Surgeon: Consuella Lose, MD;  Location: [place];  Service: Neurosurgery;  Laterality: N/A;   CRANIOTOMY Left 08/30/2020   Procedure: Stereotactic left frontoparietal craniectomy for resection of tumor with brainlab;  Surgeon: Consuella Lose, MD;  Location: [place];  Service: Neurosurgery;  Laterality: Left;   ENDOBRONCHIAL ULTRASOUND N/A 12/27/2019   Procedure: ENDOBRONCHIAL ULTRASOUND;  Surgeon: Laurin Coder, MD;  [facility];  Service: Endoscopy;  Laterality: N/A;   FINE NEEDLE ASPIRATION  12/27/2019   Procedure: FINE NEEDLE ASPIRATION;  Surgeon: Laurin Coder, MD;  [facility];  Service:  Endoscopy;;   IR IMAGING GUIDED PORT INSERTION  11/30/2020   VIDEO BRONCHOSCOPY N/A 12/27/2019   Procedure: VIDEO BRONCHOSCOPY WITHOUT FLUORO;  Surgeon: Laurin Coder, MD;  [facility];  Service: Endoscopy;  Laterality: N/A;    There were no vitals filed for this visit.   Subjective Assessment - 06/12/21 1451     Subjective  Pt reports he has some neck pain today that he woke up w/ this morning    Pertinent History Non-small cell lung cancer, adenocarcinoma (dx August 2021) s/p chemoradiation; now on immunotherapy w/ [employer]. Developed brain metastasis in April 2022 w/ tumor resection 08/30/20. Presented to ED 04/01/21 and was found to have complex partial seizures 2/2 brain metastasis.    Patient Stated Goals Be able to move around better; improve functional transfers to R side    Currently in Pain? Yes    Pain Score 7     Pain Location Neck    Pain Orientation Right    Pain Descriptors / Indicators Sharp    Pain Type Acute pain    Pain Onset Today    Aggravating Factors  Moving neck    Pain Relieving Factors Took Tylenol prior to therapy appts             OT Education - 06/12/21 1516     Education Details OT discussed energy conservation strategies w/ pt, providing specific examples applicable to pt. OT also discussed potential benefit of AE including  use of tray for RW and reacher; pt able to return demonstration of use of reacher and corresponding handouts provided for self-purchase    Person(s) Educated Patient    Methods Explanation;Handout;Demonstration    Comprehension Verbalized understanding;Returned demonstration             Treatment/Exercises - 06/12/21    Putty exercises Full gross grasp of putty w/ R hand, focusing on incorporating all digits and reforming putty w/ each rep to also incorporate in-hand manipulation  Attempted thumb to index and thumb to long finger opposition along length of putty w/ pt demonstrating difficulty achieving correct  positioning and remembering to avoid compensatory strategies. OT modified activity to 3-point pinch and pull of putty w/ increased success; pt occasionally required verbal/tactile cues for positioning w/ repetition            OT Short Term Goals - 06/12/21 1454       OT SHORT TERM GOAL #1   Title Pt will verbalize or demonstrate understanding of energy conservation strategies to incorporate into BADLs    Baseline Reports fatigue/decreased activity tolerance    Time 3    Period Weeks    Status On-going    Target Date 06/28/21             OT Long Term Goals - 06/12/21 1454       OT LONG TERM GOAL #1   Title Pt will improve R hand (dominant side) grip strength by at least 9 lbs to decrease drops during IADL tasks    Baseline 25 lbs w/ RUE (LUE 42 lbs)    Time 6    Period Weeks    Status On-going    Target Date 07/19/21      OT LONG TERM GOAL #2   Title Pt will safely demonstrate simulated tub/shower transfer w/ Mod I, incorporating compensatory strategies and/or DME/AE prn    Baseline Reports difficulty w/ tub/shower transfer due to RLE weakness    Time 6    Period Weeks    Status On-going    Target Date 07/19/21      OT LONG TERM GOAL #3   Title Pt will demonstrate independence w/ recommended AE to improve safety w/ BADLs (reacher, long-handled sponge, etc.)    Baseline Decreased activity tolerance and limitations w/ cognition impacting safety w/ ADLs    Time 6    Period Weeks    Status On-going    Target Date 07/19/21             Plan - 06/12/21 1455     Clinical Impression Statement OT reviewed goals w/ pt who is agreeable to POC at this time. Energy conservation strategies then introduced, w/ pt providing relevant education on benefit of incorporating strategies into daily activities. In a similar vein, OT dicussed potential benefit of carrying tray for RW and reacher, both to continue to facilitate energy conservation, as well as contribute to fall  prevention and independence due to pt living alone. Putty exercises also introduced this session w/ pt demonstrating decreased working memory and coordination/execution of motor planning, requiring occasional redirection for positioning/alignment during exercises, particularly pinch-related exercises. Putty administered to pt and he will benefit from review of exercises next session.    OT Occupational Profile and History Detailed Assessment- Review of Records and additional review of physical, cognitive, psychosocial history related to current functional performance    Occupational performance deficits (Please refer to evaluation for details): ADL's;IADL's;Social Participation;Leisure    Body Structure /  Function / Physical Skills ADL;Decreased knowledge of use of DME;Strength;Balance;Dexterity;Body mechanics;UE functional use;Endurance;IADL;Coordination;FMC;Mobility    Cognitive Skills Memory;Problem Solve;Perception;Attention    Psychosocial Skills Environmental  Adaptations    Rehab Potential Fair    Clinical Decision Making Several treatment options, min-mod task modification necessary    Comorbidities Affecting Occupational Performance: Presence of comorbidities impacting occupational performance    Modification or Assistance to Complete Evaluation  Min-Moderate modification of tasks or assist with assess necessary to complete eval    OT Frequency 1x / week    OT Duration 6 weeks   Decreased frequency and duration due to insurance-based VL   OT Treatment/Interventions Self-care/ADL training;DME and/or AE instruction;Therapeutic activities;Therapeutic exercise;Cognitive remediation/compensation;Functional Mobility Training;Neuromuscular education;Energy conservation;Manual Therapy;Patient/family education    Plan Review energy conservation and putty exercises; grip strengthening activities; practice simulated tub/shower transfer/bathing   Recommended Other Services Pt currently receiving PT/ST at  this clinic    Consulted and Agree with Plan of Care Patient            Patient will benefit from skilled therapeutic intervention in order to improve the following deficits and impairments:   Body Structure / Function / Physical Skills: ADL, Decreased knowledge of use of DME, Strength, Balance, Dexterity, Body mechanics, UE functional use, Endurance, IADL, Coordination, FMC, Mobility Cognitive Skills: Memory, Problem Solve, Perception, Attention Psychosocial Skills: Environmental  Adaptations   Visit Diagnosis: Frontal lobe and executive function deficit  Other abnormalities of gait and mobility  Muscle weakness (generalized)  Other lack of coordination   Problem List Patient Active Problem List   Diagnosis Date Noted   Gross hematuria 04/07/2021   Status 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 ([place]) 04/07/2021   Aspiration pneumonia of right lower lobe ([place]) 04/07/2021   Pericardial effusion 04/07/2021   Acute respiratory failure with hypoxia ([facility])    Acute metabolic encephalopathy 78/67/5449   Seizures ([place]) 04/01/2021   Neutropenia ([place]) 12/17/2020   Brain mass 08/24/2020   Brain metastasis ([place]) 08/23/2020   Encounter for antineoplastic immunotherapy 04/02/2020   Chemotherapy induced neutropenia ([place]) 02/28/2020   Malignant neoplasm of bronchus of right upper lobe ([place]) 01/03/2020   Encounter for antineoplastic chemotherapy 01/03/2020   Goals of care, counseling/discussion 01/03/2020   Malnutrition of moderate degree 12/26/2019   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[place]) 12/25/2019   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 [place], [place], [address] Phone:  [phone]   Fax:  [phone]  Name: [name] [name] MRN: [id number] Date of Birth: 09/15/1955

## 2021-06-17 ENCOUNTER — Ambulatory Visit: Payer: Medicaid Other | Admitting: Physical Therapy

## 2021-06-17 ENCOUNTER — Ambulatory Visit: Payer: Medicaid Other | Admitting: Speech Pathology

## 2021-06-17 ENCOUNTER — Other Ambulatory Visit: Payer: Self-pay

## 2021-06-17 ENCOUNTER — Encounter: Payer: Self-pay | Admitting: Internal Medicine

## 2021-06-17 ENCOUNTER — Encounter: Payer: Self-pay | Admitting: Occupational Therapy

## 2021-06-17 ENCOUNTER — Encounter: Payer: Self-pay | Admitting: Physical Therapy

## 2021-06-17 ENCOUNTER — Encounter: Payer: Self-pay | Admitting: Medical Oncology

## 2021-06-17 ENCOUNTER — Ambulatory Visit: Payer: Medicaid Other | Admitting: Occupational Therapy

## 2021-06-17 ENCOUNTER — Encounter: Payer: Self-pay | Admitting: Speech Pathology

## 2021-06-17 ENCOUNTER — Inpatient Hospital Stay (HOSPITAL_BASED_OUTPATIENT_CLINIC_OR_DEPARTMENT_OTHER): Payer: Medicaid Other | Admitting: Internal Medicine

## 2021-06-17 VITALS — BP 106/73 | HR 109 | Temp 97.5°F | Resp 18 | Ht 60.0 in | Wt 127.2 lb

## 2021-06-17 DIAGNOSIS — C3411 Malignant neoplasm of upper lobe, right bronchus or lung: Secondary | ICD-10-CM | POA: Diagnosis not present

## 2021-06-17 DIAGNOSIS — R262 Difficulty in walking, not elsewhere classified: Secondary | ICD-10-CM

## 2021-06-17 DIAGNOSIS — R41844 Frontal lobe and executive function deficit: Secondary | ICD-10-CM

## 2021-06-17 DIAGNOSIS — C7931 Secondary malignant neoplasm of brain: Secondary | ICD-10-CM | POA: Diagnosis not present

## 2021-06-17 DIAGNOSIS — M6281 Muscle weakness (generalized): Secondary | ICD-10-CM

## 2021-06-17 DIAGNOSIS — R41841 Cognitive communication deficit: Secondary | ICD-10-CM

## 2021-06-17 DIAGNOSIS — Z5111 Encounter for antineoplastic chemotherapy: Secondary | ICD-10-CM

## 2021-06-17 DIAGNOSIS — R2689 Other abnormalities of gait and mobility: Secondary | ICD-10-CM

## 2021-06-17 DIAGNOSIS — R2681 Unsteadiness on feet: Secondary | ICD-10-CM

## 2021-06-17 DIAGNOSIS — R278 Other lack of coordination: Secondary | ICD-10-CM

## 2021-06-17 MED ORDER — DEXAMETHASONE 4 MG PO TABS
ORAL_TABLET | ORAL | 1 refills | Status: AC
  Filled 2021-06-17: qty 12, 21d supply, fill #0
  Filled 2021-07-08: qty 12, 21d supply, fill #1
  Filled 2021-07-31: qty 12, 21d supply, fill #2
  Filled 2021-08-19: qty 12, 21d supply, fill #3

## 2021-06-17 NOTE — Therapy (Signed)
[facility]. [place], [place], [address] Phone: [phone]   Fax:  [phone]  Speech Language Pathology Treatment  Patient Details  Name: [name] [name] MRN: [id number] Date of Birth: May 16, 1956 Referring Provider (SLP): Ventura Sellers MD   Encounter Date: 06/17/2021   End of Session - 06/17/21 1715     Visit Number 3    Number of Visits 8    Date for SLP Re-Evaluation 09/01/21    SLP Start Time 1448    SLP Stop Time  58    SLP Time Calculation (min) 40 min    Activity Tolerance Patient tolerated treatment well;Patient limited by fatigue             Past Medical History:  Diagnosis Date   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[place]) 12/25/2019   Cancer ([place])    COPD (chronic obstructive pulmonary disease) ([place]) 11/02/2007   Qualifier: Diagnosis of  By: Melvyn Novas MD, Christena Deem    Hypertension 12/24/2019   Iron deficiency anemia 08/23/2007   Qualifier: Diagnosis of  By: Jim Like      Past Surgical History:  Procedure Laterality Date   APPLICATION OF CRANIAL NAVIGATION N/A 08/30/2020   Procedure: APPLICATION OF CRANIAL NAVIGATION;  Surgeon: Consuella Lose, MD;  Location: [facility];  Service: Neurosurgery;  Laterality: N/A;   CRANIOTOMY Left 08/30/2020   Procedure: Stereotactic left frontoparietal craniectomy for resection of tumor with brainlab;  Surgeon: Consuella Lose, MD;  Location: [facility];  Service: Neurosurgery;  Laterality: Left;   ENDOBRONCHIAL ULTRASOUND N/A 12/27/2019   Procedure: ENDOBRONCHIAL ULTRASOUND;  Surgeon: Laurin Coder, MD;  Location: [facility];  Service: Endoscopy;  Laterality: N/A;   FINE NEEDLE ASPIRATION  12/27/2019   Procedure: FINE NEEDLE ASPIRATION;  Surgeon: Laurin Coder, MD;  [facility]: [facility];  Service: Endoscopy;;   IR IMAGING GUIDED PORT INSERTION  11/30/2020   VIDEO BRONCHOSCOPY N/A 12/27/2019   Procedure: VIDEO BRONCHOSCOPY WITHOUT FLUORO;  Surgeon: Laurin Coder, MD;  [facility];  Service: Endoscopy;  Laterality: N/A;    There were no vitals filed for this visit.   Subjective Assessment - 06/17/21 1452     Subjective "I had to go to [facility]."    Currently in Pain? No/denies    Pain Score 7     Pain Location Neck    Pain Orientation Right    Pain Descriptors / Indicators Sharp    Pain Type Acute pain    Pain Onset In the past 7 days    Aggravating Factors  Moving neck                   ADULT SLP TREATMENT - 06/17/21 1714       General Information   Behavior/Cognition Alert;Cooperative      Treatment Provided   Treatment provided Cognitive-Linquistic      Cognitive-Linquistic Treatment   Treatment focused on Cognition    Skilled Treatment Began edu on memory strategies this session. Pt reports someone manages all of his appointments and his medications. SLP edu on how these strategies can increase independence with daily tasks. Pt reported he could benefit from a journal to help with recalling events. To cont next session.      Assessment / Recommendations / Plan   Plan Continue with current plan of care      Progression Toward Goals   Progression toward goals Progressing toward goals  SLP Short Term Goals - 06/04/21 0915       SLP SHORT TERM GOAL #1   Title Patient will identify a problem given a scenario with 80% accuracy given modA in 4 weeks.    Time 4    Period Weeks    Status New    Target Date 07/02/21      SLP SHORT TERM GOAL #2   Title Pt will comprehend functional memory or visual aids for recall of important information during structured conversations with modA.    Time 4    Period Weeks    Status New    Target Date 07/02/21              SLP Long Term Goals - 06/04/21 0916       SLP LONG TERM GOAL #1   Title Patient will identify a problem given a scenario with 80% accuracy given minA in 8 weeks.    Time 8    Period Weeks    Status New    Target  Date 07/30/21      SLP LONG TERM GOAL #2   Title Pt will comprehend functional memory or visual aids for recall of important information during structured conversations with minA in 8 weeks.    Time 8    Period Weeks    Status New    Target Date 07/30/21              Plan - 06/17/21 1715     Clinical Impression Statement See tx note. Pt reported he does not eat much during the day (a can of ravioli and 1-2 Ensures). SLP edu on the importance of nutrition and deferred to doctor. Continue with current POC.    Speech Therapy Frequency 1x /week    Duration 8 weeks    Treatment/Interventions SLP instruction and feedback;Cueing hierarchy;Environmental controls;Language facilitation;Functional tasks;Compensatory strategies;Compensatory techniques;Internal/external aids;Multimodal communcation approach;Patient/family education    Potential to Achieve Goals Fair    Potential Considerations Ability to learn/carryover information;Severity of impairments;Family/community support    Consulted and Agree with Plan of Care Patient             Patient will benefit from skilled therapeutic intervention in order to improve the following deficits and impairments:   Cognitive communication deficit    Problem List Patient Active Problem List   Diagnosis Date Noted   Gross hematuria 04/07/2021   Status 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 ([place]) 04/07/2021   Aspiration pneumonia of right lower lobe ([place]) 04/07/2021   Pericardial effusion 04/07/2021   Acute respiratory failure with hypoxia ([facility])    Acute metabolic encephalopathy 51/88/4166   Seizures ([place]) 04/01/2021   Neutropenia ([facility]) 12/17/2020   Brain mass 08/24/2020   Brain metastasis ([place]) 08/23/2020   Encounter for antineoplastic immunotherapy 04/02/2020   Chemotherapy induced neutropenia ([place]) 02/28/2020   Malignant neoplasm of bronchus of right upper lobe ([place]) 01/03/2020   Encounter for antineoplastic chemotherapy  01/03/2020   Goals of care, counseling/discussion 01/03/2020   Malnutrition of moderate degree 12/26/2019   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[place]) 12/25/2019   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 [place], [place], [address] Phone: [phone]   Fax:  [phone]   Name: Gerold Sar MRN:  [id number] Date of Birth: 1956/05/07

## 2021-06-17 NOTE — Therapy (Signed)
[facility]. [place], [place], [address] Phone: [phone]   Fax:  [phone]  Occupational Therapy Treatment  Patient Details  Name: [name] [name] MRN: [id number] Date of Birth: [birth year] Referring Provider (OT): Cecil Cobbs, MD (oncology)   Encounter Date: 06/17/2021   OT End of Session - 06/17/21 1532     Visit Number 3    Number of Visits 7    Date for OT Re-Evaluation 08/02/21    Authorization Type [employer]    Authorization Time Period Max VL: 27 OT/PT/ST combined    Authorization - Visit Number 10    Authorization - Number of Visits 27   Max/year combined   OT Start Time 4008    OT Stop Time 1609    OT Time Calculation (min) 42 min    Activity Tolerance Patient tolerated treatment well            Past Medical History:  Diagnosis Date   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[place]) 12/25/2019   Cancer ([facility])    COPD (chronic obstructive pulmonary disease) ([facility]) 11/02/2007   Qualifier: Diagnosis of  By: Melvyn Novas MD, Christena Deem    Hypertension 12/24/2019   Iron deficiency anemia 08/23/2007   Qualifier: Diagnosis of  By: Jim Like      Past Surgical History:  Procedure Laterality Date   APPLICATION OF CRANIAL NAVIGATION N/A 08/30/2020   Procedure: APPLICATION OF CRANIAL NAVIGATION;  Surgeon: Consuella Lose, MD;  Location: [facility];  Service: Neurosurgery;  Laterality: N/A;   CRANIOTOMY Left 08/30/2020   Procedure: Stereotactic left frontoparietal craniectomy for resection of tumor with brainlab;  Surgeon: Consuella Lose, MD;  Location: [facility];  Service: Neurosurgery;  Laterality: Left;   ENDOBRONCHIAL ULTRASOUND N/A 12/27/2019   Procedure: ENDOBRONCHIAL ULTRASOUND;  Surgeon: Laurin Coder, MD;  [facility];  Service: Endoscopy;  Laterality: N/A;   FINE NEEDLE ASPIRATION  12/27/2019   Procedure: FINE NEEDLE ASPIRATION;  Surgeon: Laurin Coder, MD;  [facility];  Service:  Endoscopy;;   IR IMAGING GUIDED PORT INSERTION  11/30/2020   VIDEO BRONCHOSCOPY N/A 12/27/2019   Procedure: VIDEO BRONCHOSCOPY WITHOUT FLUORO;  Surgeon: Laurin Coder, MD;  [facility];  Service: Endoscopy;  Laterality: N/A;    There were no vitals filed for this visit.   Subjective Assessment - 06/17/21 1529     Subjective  Pt states he had an oncology appt earlier today and will be starting chemo tx soon    Pertinent History Non-small cell lung cancer, adenocarcinoma (dx August 2021) s/p chemoradiation; now on immunotherapy w/ Imfinzi. Developed brain metastasis in April 2022 w/ tumor resection 08/30/20. Presented to ED 04/01/21 and was found to have complex partial seizures 2/2 brain metastasis.    Patient Stated Goals Be able to move around better; improve functional transfers to R side    Currently in Pain? Yes    Pain Score 7     Pain Location Neck    Pain Orientation Right    Pain Descriptors / Indicators Sharp   "sharp" w/ movement   Pain Type Acute pain    Pain Onset In the past 7 days             Treatment/Exercises - 06/17/21    Hand Strengthening  Picking up 1" blocks w/ power grip exerciser (20 lbs, yellow coil) using R hand and placing in a container; pt completed 2 sets of 15 blocks w/ min drops only during first set  Pegboard Copying pattern onto pegboard w/ easy-grip pegs; pt able to complete activity w/ min verbal cues for spatial orientation. Pt required extended time for processing and OT provided add'l cues to facilitate self-correction    ADLs Activity 4-step and 5-step sequencing activity requiring pt to order cards w/ written ADL/IADL tasks in the correct order; OT provided visual aid of numbered targets on a piece of paper to recall order of steps. Pt able to complete 2/2 4-step sequences w/ Mod I (extended time) and 2/2 5-step sequence w/ Mod I (requiring extended time but demonstrating good self-correction). OT incorporated pt verbalizing an  example of an energy conservation technique he could incorporate into each task w/ pt requiring extended time for processing, binary choice, and breakdown of task to facilitate success w/ this            OT Short Term Goals - 06/12/21 1454       OT SHORT TERM GOAL #1   Title Pt will verbalize or demonstrate understanding of energy conservation strategies to incorporate into BADLs    Baseline Reports fatigue/decreased activity tolerance    Time 3    Period Weeks    Status On-going    Target Date 06/28/21             OT Long Term Goals - 06/12/21 1454       OT LONG TERM GOAL #1   Title Pt will improve R hand (dominant side) grip strength by at least 9 lbs to decrease drops during IADL tasks    Baseline 25 lbs w/ RUE (LUE 42 lbs)    Time 6    Period Weeks    Status On-going    Target Date 07/19/21      OT LONG TERM GOAL #2   Title Pt will safely demonstrate simulated tub/shower transfer w/ Mod I, incorporating compensatory strategies and/or DME/AE prn    Baseline Reports difficulty w/ tub/shower transfer due to RLE weakness    Time 6    Period Weeks    Status On-going    Target Date 07/19/21      OT LONG TERM GOAL #3   Title Pt will demonstrate independence w/ recommended AE to improve safety w/ BADLs (reacher, long-handled sponge, etc.)    Baseline Decreased activity tolerance and limitations w/ cognition impacting safety w/ ADLs    Time 6    Period Weeks    Status On-going    Target Date 07/19/21             Plan - 06/17/21 1528     Clinical Impression Statement OT facilitated hand strengthening this session, incorporating power grip exerciser and easy-grip pegs to target both gross hand strength and pinch strength. Pt did require extended time to complete copying of pegboard pattern, but was able to be successful w/ minimal cues for self-correction. To continue to address understanding of applicable energy conservation strategies, OT walked pt through  sequencing activity (to address problem-solving and cognition) and then had pt identify an appropriate energy conservation strategy out of 4 P's (planning, prioritizing, pacing, positioning). Pt experienced difficulty w/ this and will benefit from additional review next session.    OT Occupational Profile and History Detailed Assessment- Review of Records and additional review of physical, cognitive, psychosocial history related to current functional performance    Occupational performance deficits (Please refer to evaluation for details): ADL's;IADL's;Social Participation;Leisure    Body Structure / Function / Physical Skills ADL;Decreased knowledge of use of DME;Strength;Balance;Dexterity;Body  mechanics;UE functional use;Endurance;IADL;Coordination;FMC;Mobility    Cognitive Skills Memory;Problem Solve;Perception;Attention    Psychosocial Skills Environmental  Adaptations    Rehab Potential Fair    Clinical Decision Making Several treatment options, min-mod task modification necessary    Comorbidities Affecting Occupational Performance: Presence of comorbidities impacting occupational performance    Modification or Assistance to Complete Evaluation  Min-Moderate modification of tasks or assist with assess necessary to complete eval    OT Frequency 1x / week    OT Duration 6 weeks   Decreased frequency and duration due to insurance-based VL   OT Treatment/Interventions Self-care/ADL training;DME and/or AE instruction;Therapeutic activities;Therapeutic exercise;Cognitive remediation/compensation;Functional Mobility Training;Neuromuscular education;Energy conservation;Manual Therapy;Patient/family education    Plan Review energy conservation and putty exercises; grip strengthening activities; practice simulated tub/shower transfer/bathing    Recommended Other Services Pt currently receiving PT/ST at this clinic    Consulted and Agree with Plan of Care Patient            Patient will benefit from  skilled therapeutic intervention in order to improve the following deficits and impairments:   Body Structure / Function / Physical Skills: ADL, Decreased knowledge of use of DME, Strength, Balance, Dexterity, Body mechanics, UE functional use, Endurance, IADL, Coordination, FMC, Mobility Cognitive Skills: Memory, Problem Solve, Perception, Attention Psychosocial Skills: Environmental  Adaptations   Visit Diagnosis: Muscle weakness (generalized)  Frontal lobe and executive function deficit  Other abnormalities of gait and mobility  Other lack of coordination   Problem List Patient Active Problem List   Diagnosis Date Noted   Gross hematuria 04/07/2021   Status 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 ([place]) 04/07/2021   Aspiration pneumonia of right lower lobe ([place]) 04/07/2021   Pericardial effusion 04/07/2021   Acute respiratory failure with hypoxia ([facility])    Acute metabolic encephalopathy 37/34/2876   Seizures ([place]) 04/01/2021   Neutropenia ([place]) 12/17/2020   Brain mass 08/24/2020   Brain metastasis ([place]) 08/23/2020   Encounter for antineoplastic immunotherapy 04/02/2020   Chemotherapy induced neutropenia ([place]) 02/28/2020   Malignant neoplasm of bronchus of right upper lobe ([place]) 01/03/2020   Encounter for antineoplastic chemotherapy 01/03/2020   Goals of care, counseling/discussion 01/03/2020   Malnutrition of moderate degree 12/26/2019   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[place]) 12/25/2019   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 [place], [place], [address] Phone: [phone]   Fax:  [phone]  Name: [name] [name] MRN: [id number] Date of Birth: 1956-05-09

## 2021-06-17 NOTE — Progress Notes (Signed)
.[place] Telephone:([phone]   [phone]  OFFICE PROGRESS NOTE  [name], NP [id number] [address]  DIAGNOSIS: Metastatic non-small cell lung cancer initially diagnosed as stage IIIA (Tx, N2, M0) non-small cell lung cancer, adenocarcinoma diagnosed in August 2021 and presented with large right paratracheal soft tissue mass extending from the right apex to the right suprahilar and precarinal region.  Molecular studies by Guardant 360:  [id number], 31.9%, Olaparib  [id number], 68.6%. None   BRAFAmplification, High (+++) Plasma Copy Number: 5.0  PRIOR THERAPY:  1) Concurrent chemoradiation with weekly carboplatin for AUC of 2 and paclitaxel 45 mg/M2.  First dose January 17, 2020.  Status post 7 cycles.  Last dose was given on February 28, 2020 2) Consolidation treatment with immunotherapy with Imfinzi 1500 mg IV every 4 weeks.  First dose April 09, 2020.  Status post 5 cycles.  Last dose was given July 30, 2020 discontinued secondary to disease progression with brain metastasis. 3) Stereotactic left frontoparietal craniotomy for resection of tumor on 08/30/2020. 4) First-line systemic chemotherapy with carboplatin for AUC of 5, Alimta 500 Mg/M2 and Keytruda 200 Mg IV every 3 weeks.  First dose Oct 08, 2020.  Status post 9 cycles.  Starting from cycle #5 he will be on maintenance treatment with Alimta and Keytruda every 3 weeks.   CURRENT THERAPY: Second line systemic chemotherapy with reduced dose docetaxel 65 Mg/M2 and Cyramza 10 Mg/KG every 3 weeks with Neulasta support.  First dose June 24, 2021.  INTERVAL HISTORY: [name] [name] 66 y.o. male returns to the clinic today for follow-up visit.  The patient has his caregiver Vickii Chafe available by phone during the visit.  He is feeling fine today with no concerning complaints except for fatigue and generalized weakness.  He is currently undergoing physical therapy.  He  uses a walker for moving around.  The patient denied having any current chest pain, shortness of breath, cough or hemoptysis.  He denied having any fever or chills.  He has no nausea, vomiting, diarrhea or constipation.  He has no headache or visual changes.  He lost few pounds since his last visit but he eats good.  He had repeat CT scan of the chest performed recently and he is here for evaluation and discussion of his scan results.  MEDICAL HISTORY: Past Medical History:  Diagnosis Date   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[place]) 12/25/2019   Cancer ([place])    COPD (chronic obstructive pulmonary disease) ([place]) 11/02/2007   Qualifier: Diagnosis of  By: Melvyn Novas MD, Christena Deem    Hypertension 12/24/2019   Iron deficiency anemia 08/23/2007   Qualifier: Diagnosis of  By: Jim Like      ALLERGIES:  is allergic to other.  MEDICATIONS:  Current Outpatient Medications  Medication Sig Dispense Refill   acetaminophen (TYLENOL) 500 MG tablet Take 500 mg by mouth every 6 (six) hours as needed for mild pain or moderate pain.     budesonide-formoterol (SYMBICORT) 80-4.5 MCG/ACT inhaler Inhale 2 puffs into the lungs daily. 10.2 g 0   clonazePAM (KLONOPIN) 0.5 MG tablet take 1/2 tab by mouth twice daily 30 tablet 0   dexamethasone (DECADRON) 2 MG tablet Take 1 tablet (2 mg total) by mouth daily. 30 tablet 0   feeding supplement (ENSURE ENLIVE / ENSURE PLUS) LIQD Take 237 mLs by mouth 2 (two) times daily between meals. 607 mL 12   folic acid (FOLVITE) 1 MG tablet Take 1  tablet (1 mg total) by mouth daily. 30 tablet 0   ipratropium-albuterol (DUONEB) 0.5-2.5 (3) MG/3ML SOLN Take 3 mLs by nebulization every 4 (four) hours as needed. 360 mL    levETIRAcetam (KEPPRA) 1000 MG tablet Take 1 tablet (1,000 mg total) by mouth 2 (two) times daily. 60 tablet 0   lidocaine-prilocaine (EMLA) cream Apply 1 application topically as needed. 30 g 1   Multiple Vitamin (MULTIVITAMIN) tablet Take 1 tablet by mouth daily.     nicotine  polacrilex (COMMIT) 2 MG lozenge Take 2 mg by mouth as needed.     pantoprazole (PROTONIX) 40 MG tablet Take 1 tablet (40 mg total) by mouth daily. 30 tablet 0   polyethylene glycol (MIRALAX / GLYCOLAX) 17 g packet Take 17 g by mouth daily. 14 each 0   prochlorperazine (COMPAZINE) 10 MG tablet Take 1 tablet (10 mg total) by mouth every 6 (six) hours as needed for nausea or vomiting. 30 tablet 0   prochlorperazine (COMPAZINE) 10 MG tablet Take 1 tablet (10 mg total) by mouth every 6 hours for nausea 30 tablet 0   senna (SENOKOT) 8.6 MG TABS tablet Take 1 tablet (8.6 mg total) by mouth 2 (two) times daily. 120 tablet 0   tiotropium (SPIRIVA HANDIHALER) 18 MCG inhalation capsule Place 1 capsule into inhaler and inhale daily. 30 capsule 1   umeclidinium bromide (INCRUSE ELLIPTA) 62.5 MCG/ACT AEPB Inhale 1 puff into the lungs daily. 30 each 0   VENTOLIN HFA 108 (90 Base) MCG/ACT inhaler Inhale 2 puffs into the lungs every 4 (four) hours as needed for wheezing or shortness of breath.     No current facility-administered medications for this visit.    SURGICAL HISTORY:  Past Surgical History:  Procedure Laterality Date   APPLICATION OF CRANIAL NAVIGATION N/A 08/30/2020   Procedure: APPLICATION OF CRANIAL NAVIGATION;  Surgeon: Consuella Lose, MD;  Location: [facility];  Service: Neurosurgery;  Laterality: N/A;   CRANIOTOMY Left 08/30/2020   Procedure: Stereotactic left frontoparietal craniectomy for resection of tumor with brainlab;  Surgeon: Consuella Lose, MD;  Location: [facility];  Service: Neurosurgery;  Laterality: Left;   ENDOBRONCHIAL ULTRASOUND N/A 12/27/2019   Procedure: ENDOBRONCHIAL ULTRASOUND;  Surgeon: Laurin Coder, MD;  [facility];  Service: Endoscopy;  Laterality: N/A;   FINE NEEDLE ASPIRATION  12/27/2019   Procedure: FINE NEEDLE ASPIRATION;  Surgeon: Laurin Coder, MD;  Location: [facility];  Service: Endoscopy;;   IR IMAGING GUIDED PORT INSERTION  11/30/2020    VIDEO BRONCHOSCOPY N/A 12/27/2019   Procedure: VIDEO BRONCHOSCOPY WITHOUT FLUORO;  Surgeon: Laurin Coder, MD;  [facility];  Service: Endoscopy;  Laterality: N/A;    REVIEW OF SYSTEMS:  Constitutional: positive for fatigue Eyes: negative Ears, nose, mouth, throat, and face: negative Respiratory: negative Cardiovascular: negative Gastrointestinal: negative Genitourinary:negative Integument/breast: negative Hematologic/lymphatic: negative Musculoskeletal:negative Neurological: positive for weakness Behavioral/Psych: negative Endocrine: negative Allergic/Immunologic: negative   PHYSICAL EXAMINATION: General appearance: alert, cooperative, fatigued, and no distress Head: Normocephalic, without obvious abnormality, atraumatic Neck: no adenopathy, no JVD, supple, symmetrical, trachea midline, and thyroid not enlarged, symmetric, no tenderness/mass/nodules Lymph nodes: Cervical, supraclavicular, and axillary nodes normal. Resp: clear to auscultation bilaterally Back: symmetric, no curvature. ROM normal. No CVA tenderness. Cardio: regular rate and rhythm, S1, S2 normal, no murmur, click, rub or gallop GI: soft, non-tender; bowel sounds normal; no masses,  no organomegaly Extremities: extremities normal, atraumatic, no cyanosis or edema Neurologic: Alert and oriented X 3, normal strength and tone. Normal symmetric  reflexes. Normal coordination and gait  ECOG PERFORMANCE STATUS: 1 - Symptomatic but completely ambulatory  Blood pressure 106/73, pulse (!) 109, temperature (!) 97.5 F (36.4 C), temperature source Tympanic, resp. rate 18, height 5' (1.524 m), weight 127 lb 3.2 oz (57.7 kg), SpO2 99 %.  LABORATORY DATA: Lab Results  Component Value Date   WBC 7.6 05/29/2021   HGB 12.2 (L) 05/29/2021   HCT 38.1 (L) 05/29/2021   MCV 77.8 (L) 05/29/2021   PLT 257 05/29/2021      Chemistry      Component Value Date/Time   NA 138 05/29/2021 1512   NA 144 08/03/2019 1527    K 4.1 05/29/2021 1512   CL 101 05/29/2021 1512   CO2 27 05/29/2021 1512   BUN 14 05/29/2021 1512   BUN 9 08/03/2019 1527   CREATININE 0.91 05/29/2021 1512      Component Value Date/Time   CALCIUM 9.6 05/29/2021 1512   ALKPHOS 58 05/29/2021 1512   AST 23 05/29/2021 1512   ALT 22 05/29/2021 1512   BILITOT 0.4 05/29/2021 1512       RADIOGRAPHIC STUDIES: CT Chest W Contrast  Result Date: 06/11/2021 CLINICAL DATA:  Primary Cancer Type: Lung Imaging Indication: Assess response to therapy Interval therapy since last imaging? Yes Initial Cancer Diagnosis Date: 12/27/2019; Established by: Biopsy-proven Detailed Pathology: Initial diagnosis stage IIIA non-small cell lung cancer, adenocarcinoma. Primary Tumor location: Paratracheal soft tissue mass extending from the right apex to the right suprahilar and precarinal region. Brain metastasis. Surgeries: No thoracic. 08/30/2020 stereotactic left frontoparietal craniotomy for resection of tumor. Chemotherapy: Yes; Ongoing?  No; Most recent administration: 02/2021 Immunotherapy?  Yes; Type: Keytruda; Ongoing? No Radiation therapy? Yes; Date Range: 01/17/2020 - 03/02/2020; Target: Right lung EXAM: CT CHEST WITH CONTRAST TECHNIQUE: Multidetector CT imaging of the chest was performed during intravenous contrast administration. RADIATION DOSE REDUCTION: This exam was performed according to the departmental dose-optimization program which includes automated exposure control, adjustment of the mA and/or kV according to patient size and/or use of iterative reconstruction technique. CONTRAST:  81mL OMNIPAQUE IOHEXOL 300 MG/ML  SOLN COMPARISON:  Most recent CT chest, abdomen and pelvis 02/08/2021. 01/11/2020 PET-CT. FINDINGS: Cardiovascular: Port in the anterior chest wall with tip in distal SVC. Mediastinum/Nodes: Fluid within the esophagus. LEFT lower paratracheal node measures 8 mm. Lungs/Pleura: Nodule in the LEFT lower lobe is non crease to mass size. Mass  measures 3.94.0 cm compared to 2.5 x 2.1 cm. Adjacent nodule measuring 2.6 x 2.1 cm is also increased from 1.6 1.4 cm Nodule in the LEFT upper lobe is increased in size measuring 1.7 cm (image 50/5 compared to 0.6 cm Centrilobular and paraseptal emphysema LEFT upper lobe. Resolution of the previously seen 8 mm nodule in the RIGHT lower lobe. Upper Abdomen: Limited view of the liver, kidneys, pancreas are unremarkable. Normal adrenal glands. Musculoskeletal: No aggressive osseous lesion. IMPRESSION: 1. Significant enlargement of adjacent pulmonary nodules in the LEFT lower lobe. One nodule is now mass size. 2. Enlargement nodule in LEFT upper lobe. 3. Resolution of RIGHT lobe pulmonary nodule. Electronically Signed   By: Suzy Bouchard M.D.   On: 06/11/2021 12:51    ASSESSMENT AND PLAN: This is a very pleasant 66 years old African-American male recently diagnosed with metastatic non-small cell lung cancer that was initially diagnosed as stage IIIA (Tx, N2, M0) non-small cell lung cancer, adenocarcinoma presented with large right paratracheal soft tissue mass extending from the right apex to the right suprahilar and precarinal region  diagnosed in August 2021.  The patient developed brain metastasis in April 2022. Molecular studies by Guardant 360 showed no actionable mutations. The patient completed a course of concurrent chemoradiation with weekly carboplatin for AUC of 2 and paclitaxel 45 mg/M2.  He is status post 7 cycles.  Last dose was given February 28, 2020 The patient tolerated the previous treatment well and he had partial response. The patient is currently undergoing consolidation treatment with immunotherapy with Imfinzi 1500 mg IV every 4 weeks status post 5 cycles. 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 The patient is here today to start the first cycle of systemic chemotherapy with carboplatin for AUC of 5, Alimta 500 Mg/M2 and Keytruda  200 Mg IV every 3 weeks.  Status post 8 cycles.  Starting from cycle #5 the patient will be on maintenance treatment with Alimta and Keytruda every 3 weeks. His last treatment was given in the middle of October 2022. The patient has been off treatment since that time because of his encephalopathy and seizure activity followed by rehabilitation. He had repeat CT scan of the chest performed recently.  I personally and independently reviewed the scan images and discussed the results with the patient and his caregiver. Unfortunately his CT scan of the chest showed evidence for disease progression with significant enlargement of pulmonary nodules in the left lower lobe and left upper lobe.  I had a lengthy discussion with the patient and his caregiver about his current condition and treatment options. I explained to the patient again that he has incurable condition and all the treatments are of palliative nature. I gave the patient the option of palliative care and hospice referral versus palliative second line systemic chemotherapy with reduced dose docetaxel 65 Mg/M2 and Cyramza 10 Mg/KG every 3 weeks with Neulasta support. The patient is interested in proceeding with treatment. I discussed with him the adverse effects of this treatment including but not limited to alopecia, myelosuppression, nausea and vomiting, peripheral neuropathy, liver or renal dysfunction as well as the bleeding risk from [place]. I will call his pharmacy with prescription for Decadron 8 mg p.o. twice daily the day before, day of and day after chemotherapy every 3 weeks. The patient is expected to start the first cycle of his treatment next week. He will come back for follow-up visit in 2 weeks for evaluation and management of any adverse effect of his treatment. For the history of seizure and brain metastasis, he will continue his current treatment with Keppra and taper dose of Decadron as prescribed by Dr. Mickeal Skinner. The patient was  advised to call immediately if he has any other concerning symptoms in the interval. The patient voices understanding of current disease status and treatment options and is in agreement with the current care plan.  All questions were answered. The patient knows to call the clinic with any problems, questions or concerns. We can certainly see the patient much sooner if necessary.  Disclaimer: This note was dictated with voice recognition software. Similar sounding words can inadvertently be transcribed and may not be corrected upon review.

## 2021-06-17 NOTE — Therapy (Signed)
[facility]. [place], [place], [address] Phone: [phone]   Fax:  [phone]  Physical Therapy Treatment  Patient Details  Name: [name] [name] MRN: [id number] Date of Birth: 06-16-1955 No data recorded  Encounter Date: 06/17/2021   PT End of Session - 06/17/21 1426     Visit Number 7    Date for PT Re-Evaluation 07/23/21    PT Start Time 1345    PT Stop Time 1430    PT Time Calculation (min) 45 min    Activity Tolerance Patient tolerated treatment well    Behavior During Therapy [facility] for tasks assessed/performed             Past Medical History:  Diagnosis Date   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[place]) 12/25/2019   Cancer ([place])    COPD (chronic obstructive pulmonary disease) ([place]) 11/02/2007   Qualifier: Diagnosis of  By: Melvyn Novas MD, Christena Deem    Hypertension 12/24/2019   Iron deficiency anemia 08/23/2007   Qualifier: Diagnosis of  By: Jim Like      Past Surgical History:  Procedure Laterality Date   APPLICATION OF CRANIAL NAVIGATION N/A 08/30/2020   Procedure: APPLICATION OF CRANIAL NAVIGATION;  Surgeon: Consuella Lose, MD;  Location: [facility];  Service: Neurosurgery;  Laterality: N/A;   CRANIOTOMY Left 08/30/2020   Procedure: Stereotactic left frontoparietal craniectomy for resection of tumor with brainlab;  Surgeon: Consuella Lose, MD;  Location: [facility];  Service: Neurosurgery;  Laterality: Left;   ENDOBRONCHIAL ULTRASOUND N/A 12/27/2019   Procedure: ENDOBRONCHIAL ULTRASOUND;  Surgeon: Laurin Coder, MD;  [facility];  Service: Endoscopy;  Laterality: N/A;   FINE NEEDLE ASPIRATION  12/27/2019   Procedure: FINE NEEDLE ASPIRATION;  Surgeon: Laurin Coder, MD;  [facility];  Service: Endoscopy;;   IR IMAGING GUIDED PORT INSERTION  11/30/2020   VIDEO BRONCHOSCOPY N/A 12/27/2019   Procedure: VIDEO BRONCHOSCOPY WITHOUT FLUORO;  Surgeon: Laurin Coder, MD;  [facility];   Service: Endoscopy;  Laterality: N/A;    There were no vitals filed for this visit.   Subjective Assessment - 06/17/21 1349     Subjective "Im feeling all right"    Currently in Pain? Yes    Pain Score 7     Pain Location Neck    Pain Descriptors / Indicators Cramping                               [facility] Adult PT Treatment/Exercise - 06/17/21 0001       Knee/Hip Exercises: Aerobic   Nustep L3 x 6 minutes.      Knee/Hip Exercises: Machines for Strengthening   Cybex Knee Flexion 25lb 2x10      Knee/Hip Exercises: Standing   Forward Step Up Both;2 sets;5 reps;Hand Hold: 0;Step Height: 4";Step Height: 6"    Other Standing Knee Exercises Alt 4 & 6in box taps 2x10      Knee/Hip Exercises: Seated   Sit to Sand 2 sets;10 reps;without UE support   [facility] yellow ball                      PT Short Term Goals - 06/05/21 1512       PT SHORT TERM GOAL #1   Title I with basic HEP    Status Achieved               PT Long Term  Goals - 06/17/21 1410       PT LONG TERM GOAL #1   Title I with final HEP    Status Achieved      PT LONG TERM GOAL #2   Title Increase functoinal strength to allow 5 x sit to stand in < 20 seconds      PT LONG TERM GOAL #5   Title Patient will ambulate x at least 400' with LRAD, MI, to increase his community mobility.    Status Partially Met                   Plan - 06/17/21 1426     Clinical Impression Statement Pt with a continues progression of functional interventions. Mod cues needed to maintain sequencing with step ups. Cue to not allow LE's to brace against table with alternating box taps. Fatigue reported with sit to stands OHP. Some Le clipping of sit inch box with step ups    Personal Factors and Comorbidities Comorbidity 1    Comorbidities lung Ca with brain mets    Examination-Activity Limitations Bathing;Locomotion Level;Transfers;Bed Mobility;Reach  Overhead;Sit;Carry;Squat;Dressing;Stairs;Hygiene/Grooming;Toileting;Lift;Stand    Examination-Participation Restrictions Meal Prep;Cleaning;Driving;Interpersonal Relationship;Shop;Laundry    Stability/Clinical Decision Making Evolving/Moderate complexity    Rehab Potential Good    PT Frequency 2x / week    PT Duration 8 weeks    PT Treatment/Interventions ADLs/Self Care Home Management;Neuromuscular re-education;Balance training;Therapeutic exercise;Therapeutic activities;Functional mobility training;Stair training;Gait training;Passive range of motion;Manual techniques    PT Next Visit Plan Functional strength and endurance             Patient will benefit from skilled therapeutic intervention in order to improve the following deficits and impairments:  Decreased endurance  Visit Diagnosis: Muscle weakness (generalized)  Unsteadiness on feet  Difficulty in walking, not elsewhere classified     Problem List Patient Active Problem List   Diagnosis Date Noted   Gross hematuria 04/07/2021   Status 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 ([place]) 04/07/2021   Aspiration pneumonia of right lower lobe ([place]) 04/07/2021   Pericardial effusion 04/07/2021   Acute respiratory failure with hypoxia ([place])    Acute metabolic encephalopathy 01/75/1025   Seizures ([place]) 04/01/2021   Neutropenia ([place]) 12/17/2020   Brain mass 08/24/2020   Brain metastasis ([place]) 08/23/2020   Encounter for antineoplastic immunotherapy 04/02/2020   Chemotherapy induced neutropenia ([place]) 02/28/2020   Malignant neoplasm of bronchus of right upper lobe ([place]) 01/03/2020   Encounter for antineoplastic chemotherapy 01/03/2020   Goals of care, counseling/discussion 01/03/2020   Malnutrition of moderate degree 12/26/2019   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[place]) 12/25/2019   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 [place], [place], [address] Phone: [phone]   Fax:  [phone]  Name: [name] [name] MRN: [id number] Date of Birth: 07-15-1955

## 2021-06-17 NOTE — Progress Notes (Signed)
DISCONTINUE ON PATHWAY REGIMEN - Non-Small Cell Lung     A cycle is every 21 days:     Pembrolizumab      Pemetrexed      Carboplatin   **Always confirm dose/schedule in your pharmacy ordering system**  REASON: Disease Progression PRIOR TREATMENT: LOS410: Pembrolizumab 200 mg + Pemetrexed 500 mg/m2 + Carboplatin AUC=5 q21 Days x 4 Cycles TREATMENT RESPONSE: Stable Disease (SD)  START ON PATHWAY REGIMEN - Non-Small Cell Lung     A cycle is every 21 days:     Ramucirumab      Docetaxel   **Always confirm dose/schedule in your pharmacy ordering system**  Patient Characteristics: Stage IV Metastatic, Nonsquamous, Molecular Analysis Completed, Molecular Alteration Present and Targeted Therapy Exhausted OR EGFR Exon 20+ or KRAS G12C+ or HER2+ Present and No Prior Chemo/Immunotherapy OR No Alteration Present, Second Line -  Chemotherapy/Immunotherapy, PS = 0, 1, No Prior PD-1/PD-L1  Inhibitor or Prior PD-1/PD-L1 Inhibitor + Chemotherapy, and Not a Candidate for Immunotherapy Therapeutic Status: Stage IV Metastatic Histology: Nonsquamous Cell Broad Molecular Profiling Status: Engineer, manufacturing Analysis Results: No Alteration Present ECOG Performance Status: 1 Chemotherapy/Immunotherapy Line of Therapy: Second Line Chemotherapy/Immunotherapy Immunotherapy Candidate Status: Not a Candidate for Immunotherapy Prior Immunotherapy Status: Prior PD-1/PD-L1 Inhibitor + Chemotherapy Intent of Therapy: Non-Curative / Palliative Intent, Discussed with Patient

## 2021-06-18 ENCOUNTER — Telehealth: Payer: Self-pay | Admitting: Medical Oncology

## 2021-06-18 ENCOUNTER — Other Ambulatory Visit: Payer: Self-pay

## 2021-06-18 NOTE — Telephone Encounter (Signed)
°  Peggy called re decadron doses  Pt takes decadron 2 mg daily ( for brain mets)  He takes 4 mg tablets ( two tablets ) the day before , day of , day after chemo.  Should he hold his decadron 2 mg  for 3 days  while taking the premeds?

## 2021-06-18 NOTE — Telephone Encounter (Signed)
I spoke with [name] [name] and advised as indicated. She expressed understanding of this information.

## 2021-06-19 ENCOUNTER — Ambulatory Visit: Payer: Medicaid Other | Attending: Nurse Practitioner | Admitting: Physical Therapy

## 2021-06-19 DIAGNOSIS — M6281 Muscle weakness (generalized): Secondary | ICD-10-CM | POA: Insufficient documentation

## 2021-06-19 DIAGNOSIS — R262 Difficulty in walking, not elsewhere classified: Secondary | ICD-10-CM | POA: Insufficient documentation

## 2021-06-19 DIAGNOSIS — R2689 Other abnormalities of gait and mobility: Secondary | ICD-10-CM | POA: Insufficient documentation

## 2021-06-19 DIAGNOSIS — R41844 Frontal lobe and executive function deficit: Secondary | ICD-10-CM | POA: Insufficient documentation

## 2021-06-19 DIAGNOSIS — R278 Other lack of coordination: Secondary | ICD-10-CM | POA: Insufficient documentation

## 2021-06-19 DIAGNOSIS — R41841 Cognitive communication deficit: Secondary | ICD-10-CM | POA: Insufficient documentation

## 2021-06-19 DIAGNOSIS — R2681 Unsteadiness on feet: Secondary | ICD-10-CM | POA: Insufficient documentation

## 2021-06-21 ENCOUNTER — Other Ambulatory Visit: Payer: Self-pay

## 2021-06-23 ENCOUNTER — Encounter: Payer: Self-pay | Admitting: Internal Medicine

## 2021-06-23 ENCOUNTER — Encounter: Payer: Self-pay | Admitting: Physician Assistant

## 2021-06-23 NOTE — Progress Notes (Signed)
°  Radiation Oncology         [phone]) [phone] ________________________________  Name: [name] [name] MRN: [id number]  Date: 03/26/2021  DOB: 09/23/1955  End of Treatment Note  Diagnosis:   66 y.o. 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      Indication for treatment:  Palliation       Radiation treatment dates:   03/26/21  Site/dose/beams/energy:   Left parietal 1.1 cm target was treated using 4 Rapid Arc VMAT Beams to a prescription dose of 20 Gy.  ExacTrac registration was performed for each couch angle.  The 100% isodose line was prescribed.  6 MV X-rays were delivered in the flattening filter free beam mode.  Narrative: The patient tolerated radiation treatment relatively well.     Plan: The patient has completed radiation treatment. The patient will return to radiation oncology clinic for routine followup in one month. I advised him to call or return sooner if he has any questions or concerns related to his recovery or treatment. ________________________________  Sheral Apley. Tammi Klippel, M.D.

## 2021-06-24 ENCOUNTER — Other Ambulatory Visit (INDEPENDENT_AMBULATORY_CARE_PROVIDER_SITE_OTHER): Payer: Self-pay

## 2021-06-24 ENCOUNTER — Ambulatory Visit (INDEPENDENT_AMBULATORY_CARE_PROVIDER_SITE_OTHER): Payer: Medicaid Other | Admitting: Podiatry

## 2021-06-24 ENCOUNTER — Other Ambulatory Visit: Payer: Self-pay

## 2021-06-24 ENCOUNTER — Ambulatory Visit: Payer: Medicaid Other | Admitting: Physical Therapy

## 2021-06-24 ENCOUNTER — Encounter: Payer: Self-pay | Admitting: Physical Therapy

## 2021-06-24 DIAGNOSIS — R2689 Other abnormalities of gait and mobility: Secondary | ICD-10-CM | POA: Diagnosis not present

## 2021-06-24 DIAGNOSIS — M79675 Pain in left toe(s): Secondary | ICD-10-CM

## 2021-06-24 DIAGNOSIS — R2681 Unsteadiness on feet: Secondary | ICD-10-CM | POA: Diagnosis present

## 2021-06-24 DIAGNOSIS — B351 Tinea unguium: Secondary | ICD-10-CM | POA: Diagnosis not present

## 2021-06-24 DIAGNOSIS — R278 Other lack of coordination: Secondary | ICD-10-CM

## 2021-06-24 DIAGNOSIS — R262 Difficulty in walking, not elsewhere classified: Secondary | ICD-10-CM | POA: Diagnosis not present

## 2021-06-24 DIAGNOSIS — M79674 Pain in right toe(s): Secondary | ICD-10-CM | POA: Diagnosis not present

## 2021-06-24 DIAGNOSIS — M6281 Muscle weakness (generalized): Secondary | ICD-10-CM

## 2021-06-24 DIAGNOSIS — R41841 Cognitive communication deficit: Secondary | ICD-10-CM | POA: Diagnosis present

## 2021-06-24 DIAGNOSIS — R41844 Frontal lobe and executive function deficit: Secondary | ICD-10-CM | POA: Diagnosis not present

## 2021-06-24 NOTE — Therapy (Signed)
[place]. [place], [place], [address] Phone: [phone]   Fax:  [phone]  Physical Therapy Treatment  Patient Details  Name: [name] [name] MRN: [id number] Date of Birth: 01/10/1956 No data recorded  Encounter Date: 06/24/2021   PT End of Session - 06/24/21 1459     Visit Number 8    Date for PT Re-Evaluation 07/23/21    PT Start Time 1415    PT Stop Time 1500    PT Time Calculation (min) 45 min    Activity Tolerance Patient tolerated treatment well    Behavior During Therapy [facility] for tasks assessed/performed             Past Medical History:  Diagnosis Date   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[place]) 12/25/2019   Cancer ([place])    COPD (chronic obstructive pulmonary disease) ([place]) 11/02/2007   Qualifier: Diagnosis of  By: Melvyn Novas MD, Christena Deem    Hypertension 12/24/2019   Iron deficiency anemia 08/23/2007   Qualifier: Diagnosis of  By: Jim Like      Past Surgical History:  Procedure Laterality Date   APPLICATION OF CRANIAL NAVIGATION N/A 08/30/2020   Procedure: APPLICATION OF CRANIAL NAVIGATION;  Surgeon: Consuella Lose, MD;  Location: [place];  Service: Neurosurgery;  Laterality: N/A;   CRANIOTOMY Left 08/30/2020   Procedure: Stereotactic left frontoparietal craniectomy for resection of tumor with brainlab;  Surgeon: Consuella Lose, MD;  Location: [place];  Service: Neurosurgery;  Laterality: Left;   ENDOBRONCHIAL ULTRASOUND N/A 12/27/2019   Procedure: ENDOBRONCHIAL ULTRASOUND;  Surgeon: Laurin Coder, MD;  [facility];  Service: Endoscopy;  Laterality: N/A;   FINE NEEDLE ASPIRATION  12/27/2019   Procedure: FINE NEEDLE ASPIRATION;  Surgeon: Laurin Coder, MD;  [facility]: [facility];  Service: Endoscopy;;   IR IMAGING GUIDED PORT INSERTION  11/30/2020   VIDEO BRONCHOSCOPY N/A 12/27/2019   Procedure: VIDEO BRONCHOSCOPY WITHOUT FLUORO;  Surgeon: Laurin Coder, MD;  [facility];   Service: Endoscopy;  Laterality: N/A;    There were no vitals filed for this visit.   Subjective Assessment - 06/24/21 1415     Subjective "Im ok, just came form a Doctors apt not too long ago"    Currently in Pain? No/denies                [facility] PT Assessment - 06/24/21 0001       Berg Balance Test   Sit to Stand Able to stand without using hands and stabilize independently    Standing Unsupported Able to stand safely 2 minutes    Sitting with Back Unsupported but Feet Supported on Floor or Stool Able to sit safely and securely 2 minutes    Stand to Sit Sits safely with minimal use of hands    Transfers Able to transfer safely, minor use of hands    Standing Unsupported with Eyes Closed Able to stand 10 seconds safely    Standing Unsupported with Feet Together Able to place feet together independently and stand 1 minute safely    From Standing, Reach Forward with Outstretched Arm Can reach confidently >25 cm (10")    From Standing Position, Pick up Object from Floor Able to pick up shoe, needs supervision    From Standing Position, Turn to Look Behind Over each Shoulder Looks behind one side only/other side shows less weight shift    Turn 360 Degrees Able to turn 360 degrees safely one side only in 4 seconds  or less    Standing Unsupported, Alternately Place Feet on Step/Stool Able to stand independently and safely and complete 8 steps in 20 seconds    Standing Unsupported, One Foot in Front Able to place foot tandem independently and hold 30 seconds    Standing on One Leg Able to lift leg independently and hold > 10 seconds    Total Score 53      Timed Up and Go Test   TUG Normal TUG    Normal TUG (seconds) 15.6   With RW                          [facility] Adult PT Treatment/Exercise - 06/24/21 0001       Knee/Hip Exercises: Machines for Strengthening   Cybex Knee Extension 5lb 2x10    Cybex Knee Flexion 25lb 2x10    Other Machine Rows 25lb 2x10       Knee/Hip Exercises: Seated   Sit to Sand 2 sets;10 reps;without UE support   OHP blue ball                      PT Short Term Goals - 06/05/21 1512       PT SHORT TERM GOAL #1   Title I with basic HEP    Status Achieved               PT Long Term Goals - 06/24/21 1439       PT LONG TERM GOAL #4   Title Score at least 48/56 on BERG to decrease fall risk.    Status Achieved   53/56     PT LONG TERM GOAL #5   Title Patient will ambulate x at least 400' with LRAD, MI, to increase his community mobility.    Status Partially Met                   Plan - 06/24/21 1459     Clinical Impression Statement Pt has progress and met some LTGs. Pt has decrease his TUG time as well as increasing hip BERG balance score. Increase resistance tolerated with sit to stands. Postural cu needed with seated rows. Cue for full ROM needed with leg extensions    Examination-Activity Limitations Bathing;Locomotion Level;Transfers;Bed Mobility;Reach Overhead;Sit;Carry;Squat;Dressing;Stairs;Hygiene/Grooming;Toileting;Lift;Stand    Examination-Participation Restrictions Meal Prep;Cleaning;Driving;Interpersonal Relationship;Shop;Laundry    Stability/Clinical Decision Making Evolving/Moderate complexity    Rehab Potential Good    PT Frequency 2x / week    PT Treatment/Interventions ADLs/Self Care Home Management;Neuromuscular re-education;Balance training;Therapeutic exercise;Therapeutic activities;Functional mobility training;Stair training;Gait training;Passive range of motion;Manual techniques    PT Next Visit Plan Functional strength and endurance             Patient will benefit from skilled therapeutic intervention in order to improve the following deficits and impairments:  Decreased endurance  Visit Diagnosis: Muscle weakness (generalized)  Other lack of coordination  Unsteadiness on feet  Difficulty in walking, not elsewhere classified     Problem  List Patient Active Problem List   Diagnosis Date Noted   Gross hematuria 04/07/2021   Status 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 (HCC) 04/07/2021   Aspiration pneumonia of right lower lobe (HCC) 04/07/2021   Pericardial effusion 04/07/2021   Acute respiratory failure with hypoxia (HCC)    Acute metabolic encephalopathy 04/02/2021   Seizures (HCC) 04/01/2021   Neutropenia (HCC) 12/17/2020   Brain mass 08/24/2020   Brain metastasis (HCC) 08/23/2020  Encounter for antineoplastic immunotherapy 04/02/2020   Chemotherapy induced neutropenia ([place]) 02/28/2020   Malignant neoplasm of bronchus of right upper lobe ([place]) 01/03/2020   Encounter for antineoplastic chemotherapy 01/03/2020   Goals of care, counseling/discussion 01/03/2020   Malnutrition of moderate degree 12/26/2019   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[place]) 12/25/2019   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 [place], [place], [address] Phone: [phone]   Fax:  [phone]  Name: [name] [name] MRN: [id number] Date of Birth: 1955-12-22

## 2021-06-24 NOTE — Progress Notes (Signed)
Subjective:   Patient ID: [name] [name], male   DOB: 66 y.o.   MRN: [id number]   HPI 66 year old male presents the office today for concerns of significantly thickened and elongated toenails that he cannot trim himself and they are causing discomfort.  Denies any swelling or redness or any drainage to the toenail sites.  No other concerns today.   Review of Systems  All other systems reviewed and are negative.  Past Medical History:  Diagnosis Date   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[place]) 12/25/2019   Cancer ([place])    COPD (chronic obstructive pulmonary disease) ([place]) 11/02/2007   Qualifier: Diagnosis of  By: Melvyn Novas MD, Christena Deem    Hypertension 12/24/2019   Iron deficiency anemia 08/23/2007   Qualifier: Diagnosis of  By: Jim Like      Past Surgical History:  Procedure Laterality Date   APPLICATION OF CRANIAL NAVIGATION N/A 08/30/2020   Procedure: APPLICATION OF CRANIAL NAVIGATION;  Surgeon: Consuella Lose, MD;  Location: [place] [facility];  Service: Neurosurgery;  Laterality: N/A;   CRANIOTOMY Left 08/30/2020   Procedure: Stereotactic left frontoparietal craniectomy for resection of tumor with brainlab;  Surgeon: Consuella Lose, MD;  Location: [facility];  Service: Neurosurgery;  Laterality: Left;   ENDOBRONCHIAL ULTRASOUND N/A 12/27/2019   Procedure: ENDOBRONCHIAL ULTRASOUND;  Surgeon: Laurin Coder, MD;  [facility];  Service: Endoscopy;  Laterality: N/A;   FINE NEEDLE ASPIRATION  12/27/2019   Procedure: FINE NEEDLE ASPIRATION;  Surgeon: Laurin Coder, MD;  [facility];  Service: Endoscopy;;   IR IMAGING GUIDED PORT INSERTION  11/30/2020   VIDEO BRONCHOSCOPY N/A 12/27/2019   Procedure: VIDEO BRONCHOSCOPY WITHOUT FLUORO;  Surgeon: Laurin Coder, MD;  [facility];  Service: Endoscopy;  Laterality: N/A;     Current Outpatient Medications:    acetaminophen (TYLENOL) 500 MG tablet, Take 500 mg by mouth every 6 (six) hours as needed for mild pain or moderate  pain., Disp: , Rfl:    budesonide-formoterol (SYMBICORT) 80-4.5 MCG/ACT inhaler, Inhale 2 puffs into the lungs daily., Disp: 10.2 g, Rfl: 0   clonazePAM (KLONOPIN) 0.5 MG tablet, take 1/2 tab by mouth twice daily, Disp: 15 tablet, Rfl: 0   dexamethasone (DECADRON) 2 MG tablet, Take 1 tablet (2 mg total) by mouth daily., Disp: 30 tablet, Rfl: 0   dexamethasone (DECADRON) 4 MG tablet, Take 2 tablets by mouth twice daily the day before day of and day after the chemotherapy every 3 weeks., Disp: 40 tablet, Rfl: 1   feeding supplement (ENSURE ENLIVE / ENSURE PLUS) LIQD, Take 237 mLs by mouth 2 (two) times daily between meals., Disp: 237 mL, Rfl: 12   folic acid (FOLVITE) 1 MG tablet, Take 1 tablet (1 mg total) by mouth daily., Disp: 30 tablet, Rfl: 0   ipratropium-albuterol (DUONEB) 0.5-2.5 (3) MG/3ML SOLN, Take 3 mLs by nebulization every 4 (four) hours as needed., Disp: 360 mL, Rfl:    levETIRAcetam (KEPPRA) 1000 MG tablet, Take 1 tablet (1,000 mg total) by mouth 2 (two) times daily., Disp: 60 tablet, Rfl: 0   lidocaine-prilocaine (EMLA) cream, Apply 1 application topically as needed., Disp: 30 g, Rfl: 1   Multiple Vitamin (MULTIVITAMIN) tablet, Take 1 tablet by mouth daily., Disp: , Rfl:    nicotine polacrilex (COMMIT) 2 MG lozenge, Take 2 mg by mouth as needed., Disp: , Rfl:    pantoprazole (PROTONIX) 40 MG tablet, Take 1 tablet (40 mg total) by mouth daily., Disp: 30 tablet, Rfl: 0   polyethylene glycol (MIRALAX /  GLYCOLAX) 17 g packet, Take 17 g by mouth daily., Disp: 14 each, Rfl: 0   prochlorperazine (COMPAZINE) 10 MG tablet, Take 1 tablet (10 mg total) by mouth every 6 hours for nausea, Disp: 30 tablet, Rfl: 0   senna (SENOKOT) 8.6 MG TABS tablet, Take 1 tablet (8.6 mg total) by mouth 2 (two) times daily., Disp: 120 tablet, Rfl: 0   tiotropium (SPIRIVA HANDIHALER) 18 MCG inhalation capsule, Place 1 capsule into inhaler and inhale daily., Disp: 30 capsule, Rfl: 1   umeclidinium bromide (INCRUSE  ELLIPTA) 62.5 MCG/ACT AEPB, Inhale 1 puff into the lungs daily., Disp: 30 each, Rfl: 0   VENTOLIN HFA 108 (90 Base) MCG/ACT inhaler, Inhale 2 puffs into the lungs every 4 (four) hours as needed for wheezing or shortness of breath., Disp: , Rfl:   Allergies  Allergen Reactions   Other Itching and Other (See Comments)    Seasonal allergies- Itchy eyes, runny nose, sneezing, scratchy throat          Objective:  Physical Exam  General: AAO x3, NAD  Dermatological: Nails are hypertrophic, dystrophic, brittle, discolored, elongated 10. No surrounding redness or drainage. Tenderness nails 1-5 bilaterally. No open lesions or pre-ulcerative lesions are identified today.  Vascular: Dorsalis Pedis artery and Posterior Tibial artery pedal pulses are 2/4 bilateral with immedate capillary fill time. There is no pain with calf compression, swelling, warmth, erythema.   Neruologic: Grossly intact via light touch bilateral.   Musculoskeletal: Aside of the nails no other areas of discomfort noted.  Gait: Unassisted, Nonantalgic.       Assessment:   Symptomatic onychomycosis     Plan:  -Treatment options discussed including all alternatives, risks, and complications -Etiology of symptoms were discussed -Nails debrided 10 without complications or bleeding. -Daily foot inspection -Follow-up in 3 months or sooner if any problems arise. In the meantime, encouraged to call the office with any questions, concerns, change in symptoms.   Celesta Gentile, DPM

## 2021-06-25 ENCOUNTER — Other Ambulatory Visit: Payer: Self-pay

## 2021-06-25 ENCOUNTER — Inpatient Hospital Stay: Payer: Medicaid Other

## 2021-06-25 VITALS — BP 124/75 | HR 88 | Temp 98.1°F | Resp 17 | Wt 125.8 lb

## 2021-06-25 DIAGNOSIS — Z5189 Encounter for other specified aftercare: Secondary | ICD-10-CM | POA: Insufficient documentation

## 2021-06-25 DIAGNOSIS — Z9221 Personal history of antineoplastic chemotherapy: Secondary | ICD-10-CM | POA: Insufficient documentation

## 2021-06-25 DIAGNOSIS — Z95828 Presence of other vascular implants and grafts: Secondary | ICD-10-CM

## 2021-06-25 DIAGNOSIS — Z5111 Encounter for antineoplastic chemotherapy: Secondary | ICD-10-CM | POA: Insufficient documentation

## 2021-06-25 DIAGNOSIS — C3490 Malignant neoplasm of unspecified part of unspecified bronchus or lung: Secondary | ICD-10-CM

## 2021-06-25 DIAGNOSIS — C7951 Secondary malignant neoplasm of bone: Secondary | ICD-10-CM | POA: Insufficient documentation

## 2021-06-25 DIAGNOSIS — Z452 Encounter for adjustment and management of vascular access device: Secondary | ICD-10-CM | POA: Insufficient documentation

## 2021-06-25 DIAGNOSIS — Z79899 Other long term (current) drug therapy: Secondary | ICD-10-CM | POA: Insufficient documentation

## 2021-06-25 DIAGNOSIS — C3411 Malignant neoplasm of upper lobe, right bronchus or lung: Secondary | ICD-10-CM | POA: Insufficient documentation

## 2021-06-25 DIAGNOSIS — M542 Cervicalgia: Secondary | ICD-10-CM | POA: Insufficient documentation

## 2021-06-25 DIAGNOSIS — Z510 Encounter for antineoplastic radiation therapy: Secondary | ICD-10-CM | POA: Insufficient documentation

## 2021-06-25 DIAGNOSIS — R630 Anorexia: Secondary | ICD-10-CM | POA: Insufficient documentation

## 2021-06-25 DIAGNOSIS — Z7689 Persons encountering health services in other specified circumstances: Secondary | ICD-10-CM | POA: Insufficient documentation

## 2021-06-25 DIAGNOSIS — C7931 Secondary malignant neoplasm of brain: Secondary | ICD-10-CM | POA: Insufficient documentation

## 2021-06-25 DIAGNOSIS — Z87891 Personal history of nicotine dependence: Secondary | ICD-10-CM | POA: Insufficient documentation

## 2021-06-25 DIAGNOSIS — Z5112 Encounter for antineoplastic immunotherapy: Secondary | ICD-10-CM | POA: Insufficient documentation

## 2021-06-25 DIAGNOSIS — Z7952 Long term (current) use of systemic steroids: Secondary | ICD-10-CM | POA: Insufficient documentation

## 2021-06-25 DIAGNOSIS — G4700 Insomnia, unspecified: Secondary | ICD-10-CM | POA: Insufficient documentation

## 2021-06-25 DIAGNOSIS — C3491 Malignant neoplasm of unspecified part of right bronchus or lung: Secondary | ICD-10-CM | POA: Insufficient documentation

## 2021-06-25 LAB — CBC WITH DIFFERENTIAL (CANCER CENTER ONLY)
Abs Immature Granulocytes: 0.09 K/uL — ABNORMAL HIGH (ref 0.00–0.07)
Basophils Absolute: 0 K/uL (ref 0.0–0.1)
Basophils Relative: 0 %
Eosinophils Absolute: 0 K/uL (ref 0.0–0.5)
Eosinophils Relative: 0 %
HCT: 35.4 % — ABNORMAL LOW (ref 39.0–52.0)
Hemoglobin: 11.4 g/dL — ABNORMAL LOW (ref 13.0–17.0)
Immature Granulocytes: 1 %
Lymphocytes Relative: 8 %
Lymphs Abs: 0.6 K/uL — ABNORMAL LOW (ref 0.7–4.0)
MCH: 24.7 pg — ABNORMAL LOW (ref 26.0–34.0)
MCHC: 32.2 g/dL (ref 30.0–36.0)
MCV: 76.6 fL — ABNORMAL LOW (ref 80.0–100.0)
Monocytes Absolute: 0.3 K/uL (ref 0.1–1.0)
Monocytes Relative: 4 %
Neutro Abs: 6.5 K/uL (ref 1.7–7.7)
Neutrophils Relative %: 87 %
Platelet Count: 297 K/uL (ref 150–400)
RBC: 4.62 MIL/uL (ref 4.22–5.81)
RDW: 15.5 % (ref 11.5–15.5)
WBC Count: 7.5 K/uL (ref 4.0–10.5)
nRBC: 0 % (ref 0.0–0.2)

## 2021-06-25 LAB — CMP (CANCER CENTER ONLY)
ALT: 14 U/L (ref 0–44)
AST: 23 U/L (ref 15–41)
Albumin: 4.1 g/dL (ref 3.5–5.0)
Alkaline Phosphatase: 60 U/L (ref 38–126)
Anion gap: 12 (ref 5–15)
BUN: 10 mg/dL (ref 8–23)
CO2: 25 mmol/L (ref 22–32)
Calcium: 9.9 mg/dL (ref 8.9–10.3)
Chloride: 102 mmol/L (ref 98–111)
Creatinine: 0.78 mg/dL (ref 0.61–1.24)
GFR, Estimated: >60 mL/min (ref >60)
Glucose, Bld: 110 mg/dL — ABNORMAL HIGH (ref 70–99)
Potassium: 3.9 mmol/L (ref 3.5–5.1)
Sodium: 139 mmol/L (ref 135–145)
Total Bilirubin: 0.3 mg/dL (ref 0.3–1.2)
Total Protein: 7.4 g/dL (ref 6.5–8.1)

## 2021-06-25 MED ORDER — SODIUM CHLORIDE 0.9 % IV SOLN
65.0000 mg/m2 | Freq: Once | INTRAVENOUS | Status: AC
  Administered 2021-06-25: 100 mg via INTRAVENOUS
  Filled 2021-06-25: qty 10

## 2021-06-25 MED ORDER — SODIUM CHLORIDE 0.9 % IV SOLN
10.0000 mg | Freq: Once | INTRAVENOUS | Status: AC
  Administered 2021-06-25: 10 mg via INTRAVENOUS
  Filled 2021-06-25: qty 10

## 2021-06-25 MED ORDER — SODIUM CHLORIDE 0.9% FLUSH
10.0000 mL | INTRAVENOUS | Status: AC | PRN
  Administered 2021-06-25: 10 mL

## 2021-06-25 MED ORDER — DIPHENHYDRAMINE HCL 50 MG/ML IJ SOLN
50.0000 mg | Freq: Once | INTRAMUSCULAR | Status: AC
  Administered 2021-06-25: 50 mg via INTRAVENOUS
  Filled 2021-06-25: qty 1

## 2021-06-25 MED ORDER — HEPARIN SOD (PORK) LOCK FLUSH 100 UNIT/ML IV SOLN
500.0000 [IU] | Freq: Once | INTRAVENOUS | Status: AC | PRN
  Administered 2021-06-25: 500 [IU]

## 2021-06-25 MED ORDER — SODIUM CHLORIDE 0.9 % IV SOLN
10.0000 mg/kg | Freq: Once | INTRAVENOUS | Status: AC
  Administered 2021-06-25: 600 mg via INTRAVENOUS
  Filled 2021-06-25: qty 50

## 2021-06-25 MED ORDER — SODIUM CHLORIDE 0.9 % IV SOLN: Freq: Once | INTRAVENOUS | Status: AC

## 2021-06-25 MED ORDER — ACETAMINOPHEN 325 MG PO TABS
650.0000 mg | ORAL_TABLET | Freq: Once | ORAL | Status: AC
  Administered 2021-06-25: 650 mg via ORAL
  Filled 2021-06-25: qty 2

## 2021-06-25 MED ORDER — SODIUM CHLORIDE 0.9% FLUSH
10.0000 mL | INTRAVENOUS | Status: DC | PRN
  Administered 2021-06-25: 10 mL

## 2021-06-25 NOTE — Patient Instructions (Signed)
[facility]  Discharge Instructions: Thank you for choosing [facility] to provide your oncology and hematology care.   If you have a lab appointment with the [facility], please go directly to the [facility] and check in at the registration area.   Wear comfortable clothing and clothing appropriate for easy access to any Portacath or PICC line.   We strive to give you quality time with your provider. You may need to reschedule your appointment if you arrive late (15 or more minutes).  Arriving late affects you and other patients whose appointments are after yours.  Also, if you miss three or more appointments without notifying the office, you may be dismissed from the clinic at the providers discretion.      For prescription refill requests, have your pharmacy contact our office and allow 72 hours for refills to be completed.    Today you received the following chemotherapy and/or immunotherapy agents: Cyramza & Docetaxel     To help prevent nausea and vomiting after your treatment, we encourage you to take your nausea medication as directed.  BELOW ARE SYMPTOMS THAT SHOULD BE REPORTED IMMEDIATELY: *FEVER GREATER THAN 100.4 F (38 C) OR HIGHER *CHILLS OR SWEATING *NAUSEA AND VOMITING THAT IS NOT CONTROLLED WITH YOUR NAUSEA MEDICATION *UNUSUAL SHORTNESS OF BREATH *UNUSUAL BRUISING OR BLEEDING *URINARY PROBLEMS (pain or burning when urinating, or frequent urination) *BOWEL PROBLEMS (unusual diarrhea, constipation, pain near the anus) TENDERNESS IN MOUTH AND THROAT WITH OR WITHOUT PRESENCE OF ULCERS (sore throat, sores in mouth, or a toothache) UNUSUAL RASH, SWELLING OR PAIN  UNUSUAL VAGINAL DISCHARGE OR ITCHING   Items with * indicate a potential emergency and should be followed up as soon as possible or go to the Emergency Department if any problems should occur.  Please show the CHEMOTHERAPY ALERT CARD or IMMUNOTHERAPY ALERT CARD at  check-in to the Emergency Department and triage nurse.  Should you have questions after your visit or need to cancel or reschedule your appointment, please contact [facility]  Dept: [phone]  and follow the prompts.  Office hours are 8:00 a.m. to 4:30 p.m. Monday - Friday. Please note that voicemails left after 4:00 p.m. may not be returned until the following business day.  We are closed weekends and major holidays. You have access to a nurse at all times for urgent questions. Please call the main number to the clinic Dept: [phone] and follow the prompts.   For any non-urgent questions, you may also contact your provider using MyChart. We now offer e-Visits for anyone 1 and older to request care online for non-urgent symptoms. For details visit [email or web address].   Also download the MyChart app! Go to the app store, search "MyChart", open the app, select [facility], and log in with your MyChart username and password.  Due to Covid, a mask is required upon entering the hospital/clinic. If you do not have a mask, one will be given to you upon arrival. For doctor visits, patients may have 1 support person aged 28 or older with them. For treatment visits, patients cannot have anyone with them due to current Covid guidelines and our immunocompromised population.   Ramucirumab injection What is this medication? RAMUCIRUMAB (ra mue SIR ue mab) is a monoclonal antibody. It is used to treat stomach cancer, colorectal cancer, liver cancer, and lung cancer. This medicine may be used for other purposes; ask your health care provider or pharmacist if you have  questions. COMMON BRAND NAME(S): Cyramza What should I tell my care team before I take this medication? They need to know if you have any of these conditions: bleeding disorders blood clots heart disease, including heart failure, heart attack, or chest pain (angina) high blood pressure infection  (especially a virus infection such as chickenpox, cold sores, or herpes) protein in your urine recent or planning to have surgery stroke an unusual or allergic reaction to ramucirumab, other medicines, foods, dyes, or preservatives pregnant or trying to get pregnant breast-feeding How should I use this medication? This medicine is for infusion into a vein. It is given by a health care professional in a hospital or clinic setting. Talk to your pediatrician regarding the use of this medicine in children. Special care may be needed. Overdosage: If you think you have taken too much of this medicine contact a poison control center or emergency room at once. NOTE: This medicine is only for you. Do not share this medicine with others. What if I miss a dose? It is important not to miss your dose. Call your doctor or health care professional if you are unable to keep an appointment. What may interact with this medication? Interactions have not been studied. This list may not describe all possible interactions. Give your health care provider a list of all the medicines, herbs, non-prescription drugs, or dietary supplements you use. Also tell them if you smoke, drink alcohol, or use illegal drugs. Some items may interact with your medicine. What should I watch for while using this medication? Your condition will be monitored carefully while you are receiving this medicine. You will need to to check your blood pressure and have your blood and urine tested while you are taking this medicine. Your condition will be monitored carefully while you are receiving this medicine. This medicine may increase your risk to bruise or bleed. Call your doctor or health care professional if you notice any unusual bleeding. Before having surgery, talk to your health care provider to make sure it is ok. This drug can increase the risk of poor healing of your surgical site or wound. You will need to stop this drug for 28 days  before surgery. After surgery, wait at least 2 weeks before restarting this drug. Make sure the surgical site or wound is healed enough before restarting this drug. Talk to your health care provider if questions. Do not become pregnant while taking this medicine or for 3 months after stopping it. Women should inform their doctor if they wish to become pregnant or think they might be pregnant. There is a potential for serious side effects to an unborn child. Talk to your health care professional or pharmacist for more information. Do not breast-feed an infant while taking this medicine or for 2 months after stopping it. This medicine may interfere with the ability to have a child. Talk with your doctor or health care professional if you are concerned about your fertility. What side effects may I notice from receiving this medication? Side effects that you should report to your doctor or health care professional as soon as possible: allergic reactions like skin rash, itching or hives, breathing problems, swelling of the face, lips, or tongue signs of infection - fever or chills, cough, sore throat chest pain or chest tightness confusion dizziness feeling faint or lightheaded, falls severe abdominal pain severe nausea, vomiting signs and symptoms of bleeding such as bloody or black, tarry stools; red or dark-brown urine; spitting up blood or  brown material that looks like coffee grounds; red spots on the skin; unusual bruising or bleeding from the eye, gums, or nose signs and symptoms of a blood clot such as breathing problems; changes in vision; chest pain; severe, sudden headache; pain, swelling, warmth in the leg; trouble speaking; sudden numbness or weakness of the face, arm or leg symptoms of a stroke: change in mental awareness, inability to talk or move one side of the body trouble walking, dizziness, loss of balance or coordination Side effects that usually do not require medical attention  (report to your doctor or health care professional if they continue or are bothersome): cold, clammy skin constipation diarrhea headache nausea, vomiting stomach pain unusually slow heartbeat unusually weak or tired This list may not describe all possible side effects. Call your doctor for medical advice about side effects. You may report side effects to FDA at 1-800-FDA-1088. Where should I keep my medication? This drug is given in a hospital or clinic and will not be stored at home. NOTE: This sheet is a summary. It may not cover all possible information. If you have questions about this medicine, talk to your doctor, pharmacist, or health care provider.  2022 Elsevier/Gold Standard (2021-01-22 00:00:00)  Docetaxel injection What is this medication? DOCETAXEL (doe se TAX el) is a chemotherapy drug. It targets fast dividing cells, like cancer cells, and causes these cells to die. This medicine is used to treat many types of cancers like breast cancer, certain stomach cancers, head and neck cancer, lung cancer, and prostate cancer. This medicine may be used for other purposes; ask your health care provider or pharmacist if you have questions. COMMON BRAND NAME(S): Docefrez, Taxotere What should I tell my care team before I take this medication? They need to know if you have any of these conditions: infection (especially a virus infection such as chickenpox, cold sores, or herpes) liver disease low blood counts, like low white cell, platelet, or red cell counts an unusual or allergic reaction to docetaxel, polysorbate 80, other chemotherapy agents, other medicines, foods, dyes, or preservatives pregnant or trying to get pregnant breast-feeding How should I use this medication? This drug is given as an infusion into a vein. It is administered in a hospital or clinic by a specially trained health care professional. Talk to your pediatrician regarding the use of this medicine in children.  Special care may be needed. Overdosage: If you think you have taken too much of this medicine contact a poison control center or emergency room at once. NOTE: This medicine is only for you. Do not share this medicine with others. What if I miss a dose? It is important not to miss your dose. Call your doctor or health care professional if you are unable to keep an appointment. What may interact with this medication? Do not take this medicine with any of the following medications: live virus vaccines This medicine may also interact with the following medications: aprepitant certain antibiotics like erythromycin or clarithromycin certain antivirals for HIV or hepatitis certain medicines for fungal infections like fluconazole, itraconazole, ketoconazole, posaconazole, or voriconazole cimetidine ciprofloxacin conivaptan cyclosporine dronedarone fluvoxamine grapefruit juice imatinib verapamil This list may not describe all possible interactions. Give your health care provider a list of all the medicines, herbs, non-prescription drugs, or dietary supplements you use. Also tell them if you smoke, drink alcohol, or use illegal drugs. Some items may interact with your medicine. What should I watch for while using this medication? Your condition will be  monitored carefully while you are receiving this medicine. You will need important blood work done while you are taking this medicine. Call your doctor or health care professional for advice if you get a fever, chills or sore throat, or other symptoms of a cold or flu. Do not treat yourself. This drug decreases your body's ability to fight infections. Try to avoid being around people who are sick. Some products may contain alcohol. Ask your health care professional if this medicine contains alcohol. Be sure to tell all health care professionals you are taking this medicine. Certain medicines, like metronidazole and disulfiram, can cause an unpleasant  reaction when taken with alcohol. The reaction includes flushing, headache, nausea, vomiting, sweating, and increased thirst. The reaction can last from 30 minutes to several hours. You may get drowsy or dizzy. Do not drive, use machinery, or do anything that needs mental alertness until you know how this medicine affects you. Do not stand or sit up quickly, especially if you are an older patient. This reduces the risk of dizzy or fainting spells. Alcohol may interfere with the effect of this medicine. Talk to your health care professional about your risk of cancer. You may be more at risk for certain types of cancer if you take this medicine. Do not become pregnant while taking this medicine or for 6 months after stopping it. Women should inform their doctor if they wish to become pregnant or think they might be pregnant. There is a potential for serious side effects to an unborn child. Talk to your health care professional or pharmacist for more information. Do not breast-feed an infant while taking this medicine or for 1 week after stopping it. Males who get this medicine must use a condom during sex with females who can get pregnant. If you get a woman pregnant, the baby could have birth defects. The baby could die before they are born. You will need to continue wearing a condom for 3 months after stopping the medicine. Tell your health care provider right away if your partner becomes pregnant while you are taking this medicine. This may interfere with the ability to father a child. You should talk to your doctor or health care professional if you are concerned about your fertility. What side effects may I notice from receiving this medication? Side effects that you should report to your doctor or health care professional as soon as possible: allergic reactions like skin rash, itching or hives, swelling of the face, lips, or tongue blurred vision breathing problems changes in vision low blood counts  - This drug may decrease the number of white blood cells, red blood cells and platelets. You may be at increased risk for infections and bleeding. nausea and vomiting pain, redness or irritation at site where injected pain, tingling, numbness in the hands or feet redness, blistering, peeling, or loosening of the skin, including inside the mouth signs of decreased platelets or bleeding - bruising, pinpoint red spots on the skin, black, tarry stools, nosebleeds signs of decreased red blood cells - unusually weak or tired, fainting spells, lightheadedness signs of infection - fever or chills, cough, sore throat, pain or difficulty passing urine swelling of the ankle, feet, hands Side effects that usually do not require medical attention (report to your doctor or health care professional if they continue or are bothersome): constipation diarrhea fingernail or toenail changes hair loss loss of appetite mouth sores muscle pain This list may not describe all possible side effects. Call your doctor  for medical advice about side effects. You may report side effects to FDA at 1-800-FDA-1088. Where should I keep my medication? This drug is given in a hospital or clinic and will not be stored at home. NOTE: This sheet is a summary. It may not cover all possible information. If you have questions about this medicine, talk to your doctor, pharmacist, or health care provider.  2022 Elsevier/Gold Standard (2021-01-22 00:00:00)

## 2021-06-26 ENCOUNTER — Ambulatory Visit: Payer: Medicaid Other | Admitting: Speech Pathology

## 2021-06-26 ENCOUNTER — Encounter: Payer: Self-pay | Admitting: Physical Therapy

## 2021-06-26 ENCOUNTER — Telehealth: Payer: Self-pay

## 2021-06-26 ENCOUNTER — Encounter: Payer: Self-pay | Admitting: Occupational Therapy

## 2021-06-26 ENCOUNTER — Ambulatory Visit: Payer: Medicaid Other | Admitting: Occupational Therapy

## 2021-06-26 ENCOUNTER — Ambulatory Visit: Payer: Medicaid Other | Admitting: Physical Therapy

## 2021-06-26 ENCOUNTER — Encounter: Payer: Self-pay | Admitting: Speech Pathology

## 2021-06-26 ENCOUNTER — Other Ambulatory Visit: Payer: Self-pay

## 2021-06-26 DIAGNOSIS — R41841 Cognitive communication deficit: Secondary | ICD-10-CM

## 2021-06-26 DIAGNOSIS — M6281 Muscle weakness (generalized): Secondary | ICD-10-CM

## 2021-06-26 DIAGNOSIS — R2681 Unsteadiness on feet: Secondary | ICD-10-CM

## 2021-06-26 DIAGNOSIS — R2689 Other abnormalities of gait and mobility: Secondary | ICD-10-CM

## 2021-06-26 DIAGNOSIS — R262 Difficulty in walking, not elsewhere classified: Secondary | ICD-10-CM

## 2021-06-26 DIAGNOSIS — R278 Other lack of coordination: Secondary | ICD-10-CM

## 2021-06-26 DIAGNOSIS — R41844 Frontal lobe and executive function deficit: Secondary | ICD-10-CM

## 2021-06-26 NOTE — Patient Instructions (Signed)
Access Code: [email or web address] URL: [email or web address] Date: 06/26/2021 Prepared by: Ethel Rana  Exercises Seated Heel Raise - 1 x daily - 7 x weekly - 2 sets - 10 reps Seated Knee Extension with Resistance - 1 x daily - 7 x weekly - 2 sets - 10 reps Seated Hip Abduction with Resistance - 1 x daily - 7 x weekly - 2 sets - 10 reps Seated March with Resistance - 1 x daily - 7 x weekly - 2 sets - 10 reps Sit to Stand - 1 x daily - 7 x weekly - 2 sets - 10 reps Sideways Walking - 1 x daily - 7 x weekly - 2 sets - 10 reps

## 2021-06-26 NOTE — Telephone Encounter (Signed)
HCPOA [name] [name] states that [name] [name] is doing well. He is eating, drinking, and urinating well. No N/V. [name] [name] knows to call [phone] if she has any questions or concerns.

## 2021-06-26 NOTE — Therapy (Signed)
[facility] [place], [place], [address] Phone: [phone]   Fax:  [phone]  Physical Therapy Treatment  Patient Details  Name: [name] [name] MRN: [id number] Date of Birth: 08/11/1955 No data recorded  Encounter Date: 06/26/2021   PT End of Session - 06/26/21 1346     Visit Number 9    Number of Visits 16    Date for PT Re-Evaluation 07/23/21    PT Start Time 1300    PT Stop Time 1340    PT Time Calculation (min) 40 min    Activity Tolerance Patient tolerated treatment well    Behavior During Therapy [facility] for tasks assessed/performed             Past Medical History:  Diagnosis Date   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[facility]) 12/25/2019   Cancer ([facility])    COPD (chronic obstructive pulmonary disease) ([facility]) 11/02/2007   Qualifier: Diagnosis of  By: Melvyn Novas MD, Christena Deem    Hypertension 12/24/2019   Iron deficiency anemia 08/23/2007   Qualifier: Diagnosis of  By: Jim Like      Past Surgical History:  Procedure Laterality Date   APPLICATION OF CRANIAL NAVIGATION N/A 08/30/2020   Procedure: APPLICATION OF CRANIAL NAVIGATION;  Surgeon: Consuella Lose, MD;  Location: [facility];  Service: Neurosurgery;  Laterality: N/A;   CRANIOTOMY Left 08/30/2020   Procedure: Stereotactic left frontoparietal craniectomy for resection of tumor with brainlab;  Surgeon: Consuella Lose, MD;  Location: [facility];  Service: Neurosurgery;  Laterality: Left;   ENDOBRONCHIAL ULTRASOUND N/A 12/27/2019   Procedure: ENDOBRONCHIAL ULTRASOUND;  Surgeon: Laurin Coder, MD;  Location: [facility];  Service: Endoscopy;  Laterality: N/A;   FINE NEEDLE ASPIRATION  12/27/2019   Procedure: FINE NEEDLE ASPIRATION;  Surgeon: Laurin Coder, MD;  Location: [facility];  Service: Endoscopy;;   IR IMAGING GUIDED PORT INSERTION  11/30/2020   VIDEO BRONCHOSCOPY N/A 12/27/2019   Procedure: VIDEO BRONCHOSCOPY WITHOUT FLUORO;  Surgeon: Laurin Coder, MD;   Location: [facility];  Service: Endoscopy;  Laterality: N/A;    There were no vitals filed for this visit.                      [facility] Adult PT Treatment/Exercise - 06/26/21 0001       Knee/Hip Exercises: Standing   Other Standing Knee Exercises B side stepping 3 x 5 steps each direction      Knee/Hip Exercises: Seated   Long Arc Quad Strengthening;Both;2 sets;10 reps    Long Arc Quad Limitations yellowq theraband    Clamshell with TheraBand Yellow    Other Seated Knee/Hip Exercises Heel raises 1 x 10 while adding resistance with hands on knees.    Marching Strengthening;Both;1 set;10 reps    Marching Limitations Yellow theraband    Sit to Sand 2 sets;10 reps;without UE support                     PT Education - 06/26/21 1346     Education Details HEP    Person(s) Educated Patient    Methods Explanation;Demonstration;Handout    Comprehension Verbalized understanding;Returned demonstration              PT Short Term Goals - 06/05/21 1512       PT SHORT TERM GOAL #1   Title I with basic HEP    Status Achieved  PT Long Term Goals - 06/24/21 1439       PT LONG TERM GOAL #4   Title Score at least 48/56 on BERG to decrease fall risk.    Status Achieved   53/56     PT LONG TERM GOAL #5   Title Patient will ambulate x at least 400' with LRAD, MI, to increase his community mobility.    Status Partially Met                   Plan - 06/26/21 1347     Clinical Impression Statement Patient is approaching the end of his PT. Initiated HEP and provided handouts, encouraging paitent to perform it at home prior to his next visit in order to assess its effectiveness. Will update as needed on next visit.    Personal Factors and Comorbidities Comorbidity 1    Comorbidities lung Ca with brain mets    Examination-Activity Limitations Bathing;Locomotion Level;Transfers;Bed Mobility;Reach  Overhead;Sit;Carry;Squat;Dressing;Stairs;Hygiene/Grooming;Toileting;Lift;Stand    Examination-Participation Restrictions Meal Prep;Cleaning;Driving;Interpersonal Relationship;Shop;Laundry    Stability/Clinical Decision Making Evolving/Moderate complexity    Clinical Decision Making Moderate    Rehab Potential Good    PT Frequency 2x / week    PT Duration 2 weeks    PT Treatment/Interventions ADLs/Self Care Home Management;Neuromuscular re-education;Balance training;Therapeutic exercise;Therapeutic activities;Functional mobility training;Stair training;Gait training;Passive range of motion;Manual techniques    PT Next Visit Plan Functional strength and endurance    PT Home Exercise Plan [id number]    Consulted and Agree with Plan of Care Patient             Patient will benefit from skilled therapeutic intervention in order to improve the following deficits and impairments:  Decreased endurance  Visit Diagnosis: Muscle weakness (generalized)  Other lack of coordination  Unsteadiness on feet  Difficulty in walking, not elsewhere classified     Problem List Patient Active Problem List   Diagnosis Date Noted   Gross hematuria 04/07/2021   Status 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 ([facility] [place]) 04/07/2021   Aspiration pneumonia of right lower lobe ([employer]) 04/07/2021   Pericardial effusion 04/07/2021   Acute respiratory failure with hypoxia ([facility])    Acute metabolic encephalopathy 00/51/1021   Seizures ([facility]) 04/01/2021   Neutropenia ([facility]) 12/17/2020   Brain mass 08/24/2020   Brain metastasis ([facility]) 08/23/2020   Encounter for antineoplastic immunotherapy 04/02/2020   Chemotherapy induced neutropenia ([facility]) 02/28/2020   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 [place], [place], [address] Phone: [phone]   Fax:  [phone]  Name: [name] [name] MRN: [id number] Date of Birth: 1956/01/29

## 2021-06-26 NOTE — Telephone Encounter (Signed)
-----   Message from [place], RN sent at 06/25/2021  4:52 PM EST ----- Regarding: [name] [name] 1st time cyramza/docetaxel Tolerated 1st time cyramza/docetaxel well on 06/25/21, needs call back

## 2021-06-26 NOTE — Therapy (Signed)
[place]. [place], [place], [address] Phone: [phone]   Fax:  [phone]  Speech Language Pathology Treatment  Patient Details  Name: [name] [name] MRN: [id number] Date of Birth: 12/28/1955 Referring Provider (SLP): Ventura Sellers MD   Encounter Date: 06/26/2021   End of Session - 06/26/21 1708     Visit Number 4    Number of Visits 8    Date for SLP Re-Evaluation 09/01/21    SLP Start Time 36    SLP Stop Time  1435    SLP Time Calculation (min) 40 min    Activity Tolerance Patient tolerated treatment well;Patient limited by fatigue             Past Medical History:  Diagnosis Date   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[place]) 12/25/2019   Cancer ([place])    COPD (chronic obstructive pulmonary disease) ([place]) 11/02/2007   Qualifier: Diagnosis of  By: Melvyn Novas MD, Christena Deem    Hypertension 12/24/2019   Iron deficiency anemia 08/23/2007   Qualifier: Diagnosis of  By: Jim Like      Past Surgical History:  Procedure Laterality Date   APPLICATION OF CRANIAL NAVIGATION N/A 08/30/2020   Procedure: APPLICATION OF CRANIAL NAVIGATION;  Surgeon: Consuella Lose, MD;  Location: [facility];  Service: Neurosurgery;  Laterality: N/A;   CRANIOTOMY Left 08/30/2020   Procedure: Stereotactic left frontoparietal craniectomy for resection of tumor with brainlab;  Surgeon: Consuella Lose, MD;  Location: [facility];  Service: Neurosurgery;  Laterality: Left;   ENDOBRONCHIAL ULTRASOUND N/A 12/27/2019   Procedure: ENDOBRONCHIAL ULTRASOUND;  Surgeon: Laurin Coder, MD;  Location: [facility];  Service: Endoscopy;  Laterality: N/A;   FINE NEEDLE ASPIRATION  12/27/2019   Procedure: FINE NEEDLE ASPIRATION;  Surgeon: Laurin Coder, MD;  [facility];  Service: Endoscopy;;   IR IMAGING GUIDED PORT INSERTION  11/30/2020   VIDEO BRONCHOSCOPY N/A 12/27/2019   Procedure: VIDEO BRONCHOSCOPY WITHOUT FLUORO;  Surgeon: Laurin Coder, MD;  [facility];  Service: Endoscopy;  Laterality: N/A;    There were no vitals filed for this visit.   Subjective Assessment - 06/26/21 1356     Subjective "These doctors appointments are filling me up."    Currently in Pain? No/denies    Pain Score 5     Pain Location Neck    Pain Orientation Right    Pain Descriptors / Indicators Sharp    Pain Type Acute pain    Pain Onset In the past 7 days    Aggravating Factors  Moving neck                   ADULT SLP TREATMENT - 06/26/21 1706       General Information   Behavior/Cognition Alert;Cooperative      Treatment Provided   Treatment provided Cognitive-Linquistic      Cognitive-Linquistic Treatment   Treatment focused on Cognition    Skilled Treatment Pt unable to get journal. SLP to supply pt with one. Discussed internal memory strategy, "repetition" for recalling important information (along with writing). Practiced using repetition for recalling rules of game (matching by color, shape, number). At the end of game pt was able to recall 2/3 independently, and 3/3 given verbal cue. Pt required mod-maxA for alternating attention during this task.      Assessment / Recommendations / Plan   Plan Continue with current plan of care      Progression Toward Goals  Progression toward goals Progressing toward goals                SLP Short Term Goals - 06/04/21 0915       SLP SHORT TERM GOAL #1   Title Patient will identify a problem given a scenario with 80% accuracy given modA in 4 weeks.    Time 4    Period Weeks    Status New    Target Date 07/02/21      SLP SHORT TERM GOAL #2   Title Pt will comprehend functional memory or visual aids for recall of important information during structured conversations with modA.    Time 4    Period Weeks    Status New    Target Date 07/02/21              SLP Long Term Goals - 06/04/21 0916       SLP LONG TERM GOAL #1   Title Patient will  identify a problem given a scenario with 80% accuracy given minA in 8 weeks.    Time 8    Period Weeks    Status New    Target Date 07/30/21      SLP LONG TERM GOAL #2   Title Pt will comprehend functional memory or visual aids for recall of important information during structured conversations with minA in 8 weeks.    Time 8    Period Weeks    Status New    Target Date 07/30/21              Plan - 06/26/21 1711     Clinical Impression Statement See tx note. Continue with current POC.    Speech Therapy Frequency 1x /week    Duration 8 weeks    Treatment/Interventions SLP instruction and feedback;Cueing hierarchy;Environmental controls;Language facilitation;Functional tasks;Compensatory strategies;Compensatory techniques;Internal/external aids;Multimodal communcation approach;Patient/family education    Potential to Achieve Goals Fair    Potential Considerations Ability to learn/carryover information;Severity of impairments;Family/community support    Consulted and Agree with Plan of Care Patient             Patient will benefit from skilled therapeutic intervention in order to improve the following deficits and impairments:   Cognitive communication deficit    Problem List Patient Active Problem List   Diagnosis Date Noted   Gross hematuria 04/07/2021   Status 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 ([place]) 04/07/2021   Aspiration pneumonia of right lower lobe ([facility]) 04/07/2021   Pericardial effusion 04/07/2021   Acute respiratory failure with hypoxia ([facility])    Acute metabolic encephalopathy 40/97/3532   Seizures ([facility]) 04/01/2021   Neutropenia ([place]) 12/17/2020   Brain mass 08/24/2020   Brain metastasis ([facility]) 08/23/2020   Encounter for antineoplastic immunotherapy 04/02/2020   Chemotherapy induced neutropenia ([place] [facility]) 02/28/2020   Malignant neoplasm of bronchus of right upper lobe ([facility]) 01/03/2020   Encounter for antineoplastic chemotherapy 01/03/2020   Goals of  care, counseling/discussion 01/03/2020   Malnutrition of moderate degree 12/26/2019   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[facility]) 12/25/2019   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 [place], [place], [address] Phone: [phone]   Fax:  [phone]   Name: Cristina Mattern MRN: [id number] Date of Birth: 1955-06-21

## 2021-06-27 ENCOUNTER — Inpatient Hospital Stay: Payer: Medicaid Other

## 2021-06-27 ENCOUNTER — Other Ambulatory Visit: Payer: Self-pay

## 2021-06-27 VITALS — BP 143/81 | HR 86 | Temp 98.2°F | Resp 20

## 2021-06-27 DIAGNOSIS — C3411 Malignant neoplasm of upper lobe, right bronchus or lung: Secondary | ICD-10-CM

## 2021-06-27 DIAGNOSIS — C7951 Secondary malignant neoplasm of bone: Secondary | ICD-10-CM | POA: Diagnosis not present

## 2021-06-27 MED ORDER — PEGFILGRASTIM-CBQV 6 MG/0.6ML ~~LOC~~ SOSY
6.0000 mg | PREFILLED_SYRINGE | Freq: Once | SUBCUTANEOUS | Status: AC
  Administered 2021-06-27: 6 mg via SUBCUTANEOUS
  Filled 2021-06-27: qty 0.6

## 2021-06-27 NOTE — Therapy (Signed)
[facility]. [place], [place], [address] Phone: [phone]   Fax:  [phone]  Occupational Therapy Treatment & Discharge Summary  Patient Details  Name: [name] [name] MRN: [id number] Date of Birth: 30-Apr-1956 Referring Provider (OT): Cecil Cobbs, MD (oncology)   Encounter Date: 06/26/2021   OT End of Session - 06/26/21 1450     Visit Number 4    Number of Visits 7    Date for OT Re-Evaluation 08/02/21    Authorization Type [employer]    Authorization Time Period Max VL: 27 OT/PT/ST combined    Authorization - Visit Number 18    Authorization - Number of Visits 27   Max/year combined   OT Start Time 0131    OT Stop Time 1530    OT Time Calculation (min) 44 min    Activity Tolerance Patient tolerated treatment well            OCCUPATIONAL THERAPY DISCHARGE SUMMARY  Visits from Start of Care: 4  Current functional level related to goals / functional outcomes: Pt verbalizes understanding of energy conservation strategies, has improved R hand grip strength, demonstrates understanding of recommended AE to improve safety at home, and demonstrated simulated tub/shower transfer w/ Mod I   Remaining deficits: Pt continues to be a high fall risk and exhibits decreased endurance and activity tolerance; limitations w/ strength; deficits w/ memory and executive functioning   Education / Equipment: Energy conservation strategies and recommended AE (e.g. w/c tray, long-handled sponge, reacher)   Patient agrees to discharge. Patient goals were met. Patient is being discharged due to meeting the stated rehab goals.   Past Medical History:  Diagnosis Date   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[facility]) 12/25/2019   Cancer ([facility])    COPD (chronic obstructive pulmonary disease) ([facility]) 11/02/2007   Qualifier: Diagnosis of  By: Melvyn Novas MD, Christena Deem    Hypertension 12/24/2019   Iron deficiency anemia 08/23/2007   Qualifier: Diagnosis of  By:  Jim Like      Past Surgical History:  Procedure Laterality Date   APPLICATION OF CRANIAL NAVIGATION N/A 08/30/2020   Procedure: APPLICATION OF CRANIAL NAVIGATION;  Surgeon: Consuella Lose, MD;  Location: [facility];  Service: Neurosurgery;  Laterality: N/A;   CRANIOTOMY Left 08/30/2020   Procedure: Stereotactic left frontoparietal craniectomy for resection of tumor with brainlab;  Surgeon: Consuella Lose, MD;  Location: [facility];  Service: Neurosurgery;  Laterality: Left;   ENDOBRONCHIAL ULTRASOUND N/A 12/27/2019   Procedure: ENDOBRONCHIAL ULTRASOUND;  Surgeon: Laurin Coder, MD;  [facility];  Service: Endoscopy;  Laterality: N/A;   FINE NEEDLE ASPIRATION  12/27/2019   Procedure: FINE NEEDLE ASPIRATION;  Surgeon: Laurin Coder, MD;  [facility];  Service: Endoscopy;;   IR IMAGING GUIDED PORT INSERTION  11/30/2020   VIDEO BRONCHOSCOPY N/A 12/27/2019   Procedure: VIDEO BRONCHOSCOPY WITHOUT FLUORO;  Surgeon: Laurin Coder, MD;  [facility];  Service: Endoscopy;  Laterality: N/A;    There were no vitals filed for this visit.   Subjective Assessment - 06/26/21 1448     Subjective  Pt states he started chemo treatment yesterday    Pertinent History Non-small cell lung cancer, adenocarcinoma (dx August 2021) s/p chemoradiation; now on immunotherapy w/ Imfinzi. Developed brain metastasis in April 2022 w/ tumor resection 08/30/20. Presented to ED 04/01/21 and was found to have complex partial seizures 2/2 brain metastasis.    Patient Stated Goals Be able to move around better; improve functional  transfers to R side    Currently in Pain? Yes    Pain Score 6    With movement   Pain Location Neck    Pain Orientation Right    Pain Descriptors / Indicators Sharp    Pain Type Acute pain    Pain Onset 1 to 4 weeks ago    Pain Frequency Intermittent    Aggravating Factors  Movement; laying down    Pain Relieving Factors Tylenol              OT Short Term Goals - 06/26/21 1451       Energy Conservation Strategies Reviewed energy conservation strategies in-depth w/ pt, including review of recommended AE (tray for RW, long-handled sponge) w/ pt returning demo of use of long-handled sponge    ADLs: Tub/Shower Transfer Demonstrated simulated tub/shower transfer w/ UE support (simulating grab bar in shower) at [facility] during 1st attempt and Mod I during 2nd attempt. OT recommended bringing pt's phone into the bathroom w/ him or only showering w/ someone in the house for safety; pt verbalized understanding    Dressing Discussed adaptive strategies for UB and LB dressing to assist w/ donning shirts and bottoms correctly pt's first attempt due to pt's report that he often ends up w/ clothes on backwards and needs additional attempts to correct this. Pt demonstrated understanding of strategies independently.    Coordination Instructed pt through FM control and coordination activities that he is able to complete at home            OT Short Term Goals - 06/26/21 1451       OT SHORT TERM GOAL #1   Title Pt will verbalize or demonstrate understanding of energy conservation strategies to incorporate into BADLs    Baseline Reports fatigue/decreased activity tolerance    Time 3    Period Weeks    Status Achieved   06/26/21   Target Date 06/28/21             OT Long Term Goals - 06/26/21 1516       OT LONG TERM GOAL #1   Title Pt will improve R hand (dominant side) grip strength by at least 9 lbs to decrease drops during IADL tasks    Baseline 25 lbs w/ RUE (LUE 42 lbs)    Time 6    Period Weeks    Status Achieved    Target Date 07/19/21      OT LONG TERM GOAL #2   Title Pt will safely demonstrate simulated tub/shower transfer w/ Mod I, incorporating compensatory strategies and/or DME/AE prn    Baseline Reports difficulty w/ tub/shower transfer due to RLE weakness    Time 6    Period Weeks    Status Achieved    Target Date  07/19/21      OT LONG TERM GOAL #3   Title Pt will demonstrate independence w/ recommended AE to improve safety w/ BADLs (reacher, long-handled sponge, etc.)    Baseline Decreased activity tolerance and limitations w/ cognition impacting safety w/ ADLs    Time 6    Period Weeks    Status Achieved    Target Date 07/19/21             Plan - 06/26/21 1551     Clinical Impression Statement [name] [name] is a 66 y/o male who has been seen in OP OT for generalized weakness s/p brain metastasis and seizures. Pt verbalizes understanding of learned strategies to improve  safety at home w/ all STGs and LTGs met. Considering pt's goals have been met at this time, insurance-based visit limit, continuation of speech and physical therapy services, as well as recent resuming of chemotherapy treatment, OT discussed potential for d/c this session. Pt is currently agreeable to d/c plan, does not report further functional concerns, and is appropriate for d/c from skilled OT to HEP at this time.   OT Occupational Profile and History Detailed Assessment- Review of Records and additional review of physical, cognitive, psychosocial history related to current functional performance    Occupational performance deficits (Please refer to evaluation for details): ADL's;IADL's;Social Participation;Leisure    Body Structure / Function / Physical Skills ADL;Decreased knowledge of use of DME;Strength;Balance;Dexterity;Body mechanics;UE functional use;Endurance;IADL;Coordination;FMC;Mobility    Cognitive Skills Memory;Problem Solve;Perception;Attention    Psychosocial Skills Environmental  Adaptations    Rehab Potential Fair    Clinical Decision Making Several treatment options, min-mod task modification necessary    Comorbidities Affecting Occupational Performance: Presence of comorbidities impacting occupational performance    Modification or Assistance to Complete Evaluation  Min-Moderate modification of tasks or assist  with assess necessary to complete eval    OT Frequency 1x / week    OT Duration 6 weeks   Decreased frequency and duration due to insurance-based VL   OT Treatment/Interventions Self-care/ADL training;DME and/or AE instruction;Therapeutic activities;Therapeutic exercise;Cognitive remediation/compensation;Functional Mobility Training;Neuromuscular education;Energy conservation;Manual Therapy;Patient/family education    Plan D/C   Recommended Other Services Pt currently receiving PT/ST at this clinic    Consulted and Agree with Plan of Care Patient            Patient will benefit from skilled therapeutic intervention in order to improve the following deficits and impairments:   Body Structure / Function / Physical Skills: ADL, Decreased knowledge of use of DME, Strength, Balance, Dexterity, Body mechanics, UE functional use, Endurance, IADL, Coordination, FMC, Mobility Cognitive Skills: Memory, Problem Solve, Perception, Attention Psychosocial Skills: Environmental  Adaptations   Visit Diagnosis: Frontal lobe and executive function deficit  Muscle weakness (generalized)  Other lack of coordination  Other abnormalities of gait and mobility   Problem List Patient Active Problem List   Diagnosis Date Noted   Gross hematuria 04/07/2021   Status 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 ([place]) 04/07/2021   Aspiration pneumonia of right lower lobe ([facility]) 04/07/2021   Pericardial effusion 04/07/2021   Acute respiratory failure with hypoxia ([facility])    Acute metabolic encephalopathy 23/55/7322   Seizures ([place]) 04/01/2021   Neutropenia ([facility]) 12/17/2020   Brain mass 08/24/2020   Brain metastasis ([place]) 08/23/2020   Encounter for antineoplastic immunotherapy 04/02/2020   Chemotherapy induced neutropenia ([place]) 02/28/2020   Malignant neoplasm of bronchus of right upper lobe ([place]) 01/03/2020   Encounter for antineoplastic chemotherapy 01/03/2020   Goals of care, counseling/discussion  01/03/2020   Malnutrition of moderate degree 12/26/2019   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[place]) 12/25/2019   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 [place], [place], [address] Phone: [phone]   Fax:  [phone]  Name: Maritza Goldsborough MRN: [id number] Date of Birth: 07/28/1955

## 2021-06-27 NOTE — Patient Instructions (Signed)

## 2021-06-28 ENCOUNTER — Other Ambulatory Visit: Payer: Self-pay

## 2021-06-28 ENCOUNTER — Telehealth: Payer: Self-pay

## 2021-06-28 ENCOUNTER — Telehealth (INDEPENDENT_AMBULATORY_CARE_PROVIDER_SITE_OTHER): Payer: Self-pay | Admitting: Primary Care

## 2021-06-28 ENCOUNTER — Other Ambulatory Visit (INDEPENDENT_AMBULATORY_CARE_PROVIDER_SITE_OTHER): Payer: Self-pay

## 2021-06-28 ENCOUNTER — Ambulatory Visit (HOSPITAL_COMMUNITY)
Admission: RE | Admit: 2021-06-28 | Discharge: 2021-06-28 | Disposition: A | Discharge status: Home / Self Care | Payer: Medicaid Other | Source: Ambulatory Visit | Attending: Internal Medicine | Admitting: Internal Medicine

## 2021-06-28 DIAGNOSIS — C7931 Secondary malignant neoplasm of brain: Secondary | ICD-10-CM

## 2021-06-28 DIAGNOSIS — C3411 Malignant neoplasm of upper lobe, right bronchus or lung: Secondary | ICD-10-CM

## 2021-06-28 MED ORDER — GADOBUTROL 1 MMOL/ML IV SOLN
6.0000 mL | Freq: Once | INTRAVENOUS | Status: AC | PRN
  Administered 2021-06-28: 6 mL via INTRAVENOUS

## 2021-06-28 MED ORDER — LEVETIRACETAM 1000 MG PO TABS
1000.0000 mg | ORAL_TABLET | Freq: Two times a day (BID) | ORAL | 2 refills | Status: DC
  Filled 2021-06-28: qty 60, 30d supply, fill #0
  Filled 2021-07-22 – 2021-07-29 (×2): qty 60, 30d supply, fill #1
  Filled 2021-08-23: qty 60, 30d supply, fill #2

## 2021-06-28 NOTE — Telephone Encounter (Signed)
Peggy, calling to follow up on PA for medication levETIRAcetam (KEPPRA) 1000 MG tablet from [place] at [facility].  Stated it was sent on 02/06 and again today.  Peggy requesting call back.

## 2021-06-28 NOTE — Telephone Encounter (Signed)
Vickii Chafe is aware that medication is not prescribed by PCP.

## 2021-06-28 NOTE — Telephone Encounter (Signed)
Patient's guardian called requesting refill on Keppra. Secure chat sent to Dr. Warren Lacy. Refill completed. Called Peggy to confirm that refill was sent to  [facility] at [facility]

## 2021-07-01 ENCOUNTER — Inpatient Hospital Stay (HOSPITAL_BASED_OUTPATIENT_CLINIC_OR_DEPARTMENT_OTHER): Payer: Medicaid Other | Admitting: Internal Medicine

## 2021-07-01 ENCOUNTER — Inpatient Hospital Stay (HOSPITAL_BASED_OUTPATIENT_CLINIC_OR_DEPARTMENT_OTHER): Payer: Medicaid Other | Admitting: Nurse Practitioner

## 2021-07-01 ENCOUNTER — Inpatient Hospital Stay: Payer: Medicaid Other | Admitting: Internal Medicine

## 2021-07-01 ENCOUNTER — Other Ambulatory Visit: Payer: Self-pay

## 2021-07-01 ENCOUNTER — Encounter: Payer: Self-pay | Admitting: Nurse Practitioner

## 2021-07-01 ENCOUNTER — Inpatient Hospital Stay: Payer: Medicaid Other

## 2021-07-01 VITALS — BP 105/71 | HR 115 | Temp 97.1°F | Resp 19 | Ht 60.0 in | Wt 124.8 lb

## 2021-07-01 VITALS — BP 105/65 | HR 110 | Temp 98.1°F | Resp 16 | Wt 125.1 lb

## 2021-07-01 DIAGNOSIS — C3411 Malignant neoplasm of upper lobe, right bronchus or lung: Secondary | ICD-10-CM

## 2021-07-01 DIAGNOSIS — C7931 Secondary malignant neoplasm of brain: Secondary | ICD-10-CM

## 2021-07-01 DIAGNOSIS — C3490 Malignant neoplasm of unspecified part of unspecified bronchus or lung: Secondary | ICD-10-CM | POA: Diagnosis not present

## 2021-07-01 DIAGNOSIS — C7951 Secondary malignant neoplasm of bone: Secondary | ICD-10-CM | POA: Diagnosis not present

## 2021-07-01 DIAGNOSIS — M542 Cervicalgia: Secondary | ICD-10-CM

## 2021-07-01 DIAGNOSIS — R569 Unspecified convulsions: Secondary | ICD-10-CM

## 2021-07-01 DIAGNOSIS — Z515 Encounter for palliative care: Secondary | ICD-10-CM | POA: Diagnosis not present

## 2021-07-01 DIAGNOSIS — Z95828 Presence of other vascular implants and grafts: Secondary | ICD-10-CM

## 2021-07-01 DIAGNOSIS — Z5111 Encounter for antineoplastic chemotherapy: Secondary | ICD-10-CM

## 2021-07-01 DIAGNOSIS — R530 Neoplastic (malignant) related fatigue: Secondary | ICD-10-CM

## 2021-07-01 DIAGNOSIS — G893 Neoplasm related pain (acute) (chronic): Secondary | ICD-10-CM

## 2021-07-01 LAB — CBC WITH DIFFERENTIAL (CANCER CENTER ONLY)
Abs Immature Granulocytes: 0.24 10*3/uL — ABNORMAL HIGH (ref 0.00–0.07)
Basophils Absolute: 0.1 10*3/uL (ref 0.0–0.1)
Basophils Relative: 2 %
Eosinophils Absolute: 0.1 10*3/uL (ref 0.0–0.5)
Eosinophils Relative: 2 %
HCT: 35.6 % — ABNORMAL LOW (ref 39.0–52.0)
Hemoglobin: 11.2 g/dL — ABNORMAL LOW (ref 13.0–17.0)
Immature Granulocytes: 8 %
Lymphocytes Relative: 34 %
Lymphs Abs: 1 10*3/uL (ref 0.7–4.0)
MCH: 24.3 pg — ABNORMAL LOW (ref 26.0–34.0)
MCHC: 31.5 g/dL (ref 30.0–36.0)
MCV: 77.2 fL — ABNORMAL LOW (ref 80.0–100.0)
Monocytes Absolute: 0.3 10*3/uL (ref 0.1–1.0)
Monocytes Relative: 11 %
Neutro Abs: 1.3 10*3/uL — ABNORMAL LOW (ref 1.7–7.7)
Neutrophils Relative %: 43 %
Platelet Count: 210 10*3/uL (ref 150–400)
RBC: 4.61 MIL/uL (ref 4.22–5.81)
RDW: 15.1 % (ref 11.5–15.5)
WBC Count: 2.9 10*3/uL — ABNORMAL LOW (ref 4.0–10.5)
nRBC: 0 % (ref 0.0–0.2)

## 2021-07-01 LAB — CMP (CANCER CENTER ONLY)
ALT: 13 U/L (ref 0–44)
AST: 14 U/L — ABNORMAL LOW (ref 15–41)
Albumin: 3.9 g/dL (ref 3.5–5.0)
Alkaline Phosphatase: 63 U/L (ref 38–126)
Anion gap: 6 (ref 5–15)
BUN: 12 mg/dL (ref 8–23)
CO2: 30 mmol/L (ref 22–32)
Calcium: 9.4 mg/dL (ref 8.9–10.3)
Chloride: 102 mmol/L (ref 98–111)
Creatinine: 0.75 mg/dL (ref 0.61–1.24)
GFR, Estimated: >60 mL/min (ref >60)
Glucose, Bld: 85 mg/dL (ref 70–99)
Potassium: 3.7 mmol/L (ref 3.5–5.1)
Sodium: 138 mmol/L (ref 135–145)
Total Bilirubin: 0.5 mg/dL (ref 0.3–1.2)
Total Protein: 6.8 g/dL (ref 6.5–8.1)

## 2021-07-01 MED ORDER — DEXAMETHASONE 1 MG PO TABS
1.0000 mg | ORAL_TABLET | Freq: Every day | ORAL | 0 refills | Status: AC
  Filled 2021-07-01: qty 10, 10d supply, fill #0

## 2021-07-01 MED ORDER — TRAMADOL HCL 50 MG PO TABS
50.0000 mg | ORAL_TABLET | Freq: Two times a day (BID) | ORAL | 0 refills | Status: DC | PRN
  Filled 2021-07-01: qty 30, 15d supply, fill #0

## 2021-07-01 MED ORDER — SODIUM CHLORIDE 0.9% FLUSH
10.0000 mL | INTRAVENOUS | Status: AC | PRN
  Administered 2021-07-01: 10 mL

## 2021-07-01 MED ORDER — HEPARIN SOD (PORK) LOCK FLUSH 100 UNIT/ML IV SOLN
500.0000 [IU] | INTRAVENOUS | Status: AC | PRN
  Administered 2021-07-01: 500 [IU]

## 2021-07-01 NOTE — Progress Notes (Signed)
[facility] at [place] [place], [place] [address] [phone]   Interval Evaluation  Date of Service: 07/01/21 Patient Name: [name] [name] Patient MRN: [id number] Patient DOB: [birth year] Provider: Ventura Sellers, MD  Identifying Statement:  [name] [name] is a 66 y.o. male with Brain metastasis ([place])  Seizures ([place])   Primary Cancer:  Oncologic History: Oncology History  Malignant neoplasm of bronchus of right upper lobe ([place])  01/03/2020 Initial Diagnosis   Adenocarcinoma of right lung, stage 3 ([place])   01/17/2020 - 02/28/2020 Chemotherapy   The patient had palonosetron (ALOXI) injection 0.25 mg, 0.25 mg, Intravenous,  Once, 7 of 7 cycles Administration: 0.25 mg (01/17/2020), 0.25 mg (02/14/2020), 0.25 mg (02/21/2020), 0.25 mg (01/24/2020), 0.25 mg (02/28/2020), 0.25 mg (01/31/2020), 0.25 mg (02/07/2020) CARBOplatin (PARAPLATIN) 190 mg in sodium chloride 0.9 % 250 mL chemo infusion, 190 mg (100 % of original dose 189.8 mg), Intravenous,  Once, 7 of 7 cycles Dose modification: 189.8 mg (original dose 189.8 mg, Cycle 1) Administration: 190 mg (01/17/2020), 190 mg (02/14/2020), 190 mg (02/21/2020), 190 mg (01/24/2020), 190 mg (02/28/2020), 190 mg (01/31/2020), 190 mg (02/07/2020) PACLitaxel (TAXOL) 66 mg in sodium chloride 0.9 % 150 mL chemo infusion (</= 80mg /m2), 45 mg/m2 = 66 mg, Intravenous,  Once, 7 of 7 cycles Administration: 66 mg (01/17/2020), 66 mg (02/14/2020), 66 mg (02/21/2020), 66 mg (01/24/2020), 66 mg (02/28/2020), 66 mg (01/31/2020), 66 mg (02/07/2020)   for chemotherapy treatment.     04/09/2020 - 07/30/2020 Chemotherapy          09/28/2020 Cancer Staging   Staging form: Lung, AJCC 8th Edition - Clinical: Stage IVA (cTX, cN2, cM1b) - Signed by Curt Bears, MD on 09/28/2020    10/08/2020 - 03/04/2021 Chemotherapy   Patient is on Treatment Plan : LUNG CARBOplatin / Pemetrexed / Pembrolizumab q21d Induction x 4 cycles /  Maintenance Pemetrexed + Pembrolizumab     06/25/2021 -  Chemotherapy   Patient is on Treatment Plan : LUNG Docetaxel + Ramucirumab q21d       CNS Oncologic History 08/30/20: Pre-Op SRS 3.2 cm target in the left parietal 18 Gy (MM) 08/31/20: Resection with Dr. Kathyrn Sheriff 03/26/21: Salvage SRS left parietal 1.1 cm target (MM)  Interval History: [name] [name] presents today for follow up after recent MRI brain.  He describes new onset throbbing neck pain, localized to the back and right side of his neck.  No symptoms radiating to arm or head; pain has been worsening over period of 3 weeks.  It is aggravated by his position, turning the head.  Otherwise no new or progressive symptoms.  Denies further seizures, on Keppra and Klonopin.  Continues to work with physical therapy for gait impairment.  Caregiver Peggy communicated via phone with [place] today.  H+P (04/30/21) Patient presents for evaluation following recent hospitalization for status epilepticus.  He was discharged with post-ictal encephalopathy to nursing/rehab facility.  Over the past week he continues to improve with regards to cognition, memory, engagement.  He is doing some walking at the center.  Has been dosing Keppra 1000mg  BID and Decadron 4mg  BID without issue.  Has upcoming appointment with palliative care later today.  Medications: Current Outpatient Medications on File Prior to Visit  Medication Sig Dispense Refill   acetaminophen (TYLENOL) 500 MG tablet Take 500 mg by mouth every 6 (six) hours as needed for mild pain or moderate pain.     budesonide-formoterol (SYMBICORT) 80-4.5 MCG/ACT inhaler Inhale 2 puffs into  the lungs daily. 10.2 g 0   clonazePAM (KLONOPIN) 0.5 MG tablet take 1/2 tab by mouth twice daily 15 tablet 0   dexamethasone (DECADRON) 2 MG tablet Take 1 tablet (2 mg total) by mouth daily. 30 tablet 0   dexamethasone (DECADRON) 4 MG tablet Take 2 tablets by mouth twice daily the day before day of and day after the  chemotherapy every 3 weeks. 40 tablet 1   feeding supplement (ENSURE ENLIVE / ENSURE PLUS) LIQD Take 237 mLs by mouth 2 (two) times daily between meals. 751 mL 12   folic acid (FOLVITE) 1 MG tablet Take 1 tablet (1 mg total) by mouth daily. 30 tablet 0   ipratropium-albuterol (DUONEB) 0.5-2.5 (3) MG/3ML SOLN Take 3 mLs by nebulization every 4 (four) hours as needed. 360 mL    levETIRAcetam (KEPPRA) 1000 MG tablet Take 1 tablet (1,000 mg total) by mouth 2 (two) times daily. 60 tablet 2   lidocaine-prilocaine (EMLA) cream Apply 1 application topically as needed. 30 g 1   Multiple Vitamin (MULTIVITAMIN) tablet Take 1 tablet by mouth daily.     nicotine polacrilex (COMMIT) 2 MG lozenge Take 2 mg by mouth as needed.     pantoprazole (PROTONIX) 40 MG tablet Take 1 tablet (40 mg total) by mouth daily. 30 tablet 0   polyethylene glycol (MIRALAX / GLYCOLAX) 17 g packet Take 17 g by mouth daily. 14 each 0   prochlorperazine (COMPAZINE) 10 MG tablet Take 1 tablet (10 mg total) by mouth every 6 hours for nausea 30 tablet 0   senna (SENOKOT) 8.6 MG TABS tablet Take 1 tablet (8.6 mg total) by mouth 2 (two) times daily. 120 tablet 0   tiotropium (SPIRIVA HANDIHALER) 18 MCG inhalation capsule Place 1 capsule into inhaler and inhale daily. 30 capsule 1   umeclidinium bromide (INCRUSE ELLIPTA) 62.5 MCG/ACT AEPB Inhale 1 puff into the lungs daily. 30 each 0   VENTOLIN HFA 108 (90 Base) MCG/ACT inhaler Inhale 2 puffs into the lungs every 4 (four) hours as needed for wheezing or shortness of breath.     No current facility-administered medications on file prior to visit.    Allergies:  Allergies  Allergen Reactions   Other Itching and Other (See Comments)    Seasonal allergies- Itchy eyes, runny nose, sneezing, scratchy throat   Past Medical History:  Past Medical History:  Diagnosis Date   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[place]) 12/25/2019   Cancer ([place])    COPD (chronic obstructive pulmonary disease) ([place]) 11/02/2007    Qualifier: Diagnosis of  By: Melvyn Novas MD, Christena Deem    Hypertension 12/24/2019   Iron deficiency anemia 08/23/2007   Qualifier: Diagnosis of  By: Jim Like     Past Surgical History:  Past Surgical History:  Procedure Laterality Date   APPLICATION OF CRANIAL NAVIGATION N/A 08/30/2020   Procedure: APPLICATION OF CRANIAL NAVIGATION;  Surgeon: Consuella Lose, MD;  Location: [facility];  Service: Neurosurgery;  Laterality: N/A;   CRANIOTOMY Left 08/30/2020   Procedure: Stereotactic left frontoparietal craniectomy for resection of tumor with brainlab;  Surgeon: Consuella Lose, MD;  Location: [facility];  Service: Neurosurgery;  Laterality: Left;   ENDOBRONCHIAL ULTRASOUND N/A 12/27/2019   Procedure: ENDOBRONCHIAL ULTRASOUND;  Surgeon: Laurin Coder, MD;  Location: [facility];  Service: Endoscopy;  Laterality: N/A;   FINE NEEDLE ASPIRATION  12/27/2019   Procedure: FINE NEEDLE ASPIRATION;  Surgeon: Laurin Coder, MD;  Location: [facility];  Service: Endoscopy;;   IR IMAGING GUIDED PORT INSERTION  11/30/2020   VIDEO BRONCHOSCOPY N/A 12/27/2019   Procedure: VIDEO BRONCHOSCOPY WITHOUT FLUORO;  Surgeon: Laurin Coder, MD;  Location: [facility];  Service: Endoscopy;  Laterality: N/A;   Social History:  Social History   Socioeconomic History   Marital status: Single    Spouse name: Not on file   Number of children: Not on file   Years of education: Not on file   Highest education level: Not on file  Occupational History   Not on file  Tobacco Use   Smoking status: Former    Packs/day: 0.50    Types: Cigarettes   Smokeless tobacco: Never   Tobacco comments:    Pt states he stopped since he was discharged from the hospital  Vaping Use   Vaping Use: Never used  Substance and Sexual Activity   Alcohol use: Not Currently   Drug use: Never   Sexual activity: Not Currently  Other Topics Concern   Not on file  Social History Narrative   Not on file   Social Determinants of  Health   Financial Resource Strain: Not on file  Food Insecurity: Not on file  Transportation Needs: Not on file  Physical Activity: Not on file  Stress: Not on file  Social Connections: Not on file  Intimate Partner Violence: Not on file   Family History:  Family History  Problem Relation Age of Onset   Hypertension Other     Review of Systems: Constitutional: Doesn't report fevers, chills or abnormal weight loss Eyes: Doesn't report blurriness of vision Ears, nose, mouth, throat, and face: Doesn't report sore throat Respiratory: Doesn't report cough, dyspnea or wheezes Cardiovascular: Doesn't report palpitation, chest discomfort  Gastrointestinal:  Doesn't report nausea, constipation, diarrhea GU: Doesn't report incontinence Skin: Doesn't report skin rashes Neurological: Per HPI Musculoskeletal: Doesn't report joint pain Behavioral/Psych: Doesn't report anxiety  Physical Exam: Vitals:   07/01/21 1047  BP: 105/65  Pulse: (!) 110  Resp: 16  Temp: 98.1 F (36.7 C)  SpO2: 96%   KPS: 70. General: Alert, cooperative, pleasant, in no acute distress Head: Normal EENT: +neck tenderness, right lateral Lungs: Resp effort normal Cardiac: Regular rate Abdomen: Non-distended abdomen Skin: No rashes cyanosis or petechiae. Extremities: No clubbing or edema  Neurologic Exam: Mental Status: Awake, alert, attentive to examiner. Oriented to self and environment. Language is fluent with intact comprehension. Age advanced psychomotor slowing, impaired insight and object recall  Cranial Nerves: Visual acuity is grossly normal. Visual fields are full. Extra-ocular movements intact. No ptosis. Face is symmetric Motor: Tone and bulk are normal. Power is full in both arms and legs. Reflexes are symmetric, no pathologic reflexes present.  Sensory: Intact to light touch Gait: Dystaxic.   Labs: I have reviewed the data as listed    Component Value Date/Time   NA 138 07/01/2021 0923    NA 144 08/03/2019 1527   K 3.7 07/01/2021 0923   CL 102 07/01/2021 0923   CO2 30 07/01/2021 0923   GLUCOSE 85 07/01/2021 0923   BUN 12 07/01/2021 0923   BUN 9 08/03/2019 1527   CREATININE 0.75 07/01/2021 0923   CALCIUM 9.4 07/01/2021 0923   PROT 6.8 07/01/2021 0923   PROT 7.3 08/03/2019 1527   ALBUMIN 3.9 07/01/2021 0923   ALBUMIN 4.6 08/03/2019 1527   AST 14 (L) 07/01/2021 0923   ALT 13 07/01/2021 0923   ALKPHOS 63 07/01/2021 0923   BILITOT 0.5 07/01/2021 0923   GFRNONAA >60 07/01/2021 0923   GFRAA >60  02/14/2020 9629   Lab Results  Component Value Date   WBC 2.9 (L) 07/01/2021   NEUTROABS 1.3 (L) 07/01/2021   HGB 11.2 (L) 07/01/2021   HCT 35.6 (L) 07/01/2021   MCV 77.2 (L) 07/01/2021   PLT 210 07/01/2021    Imaging:  CT Chest W Contrast  Result Date: 06/11/2021 CLINICAL DATA:  Primary Cancer Type: Lung Imaging Indication: Assess response to therapy Interval therapy since last imaging? Yes Initial Cancer Diagnosis Date: 12/27/2019; Established by: Biopsy-proven Detailed Pathology: Initial diagnosis stage IIIA non-small cell lung cancer, adenocarcinoma. Primary Tumor location: Paratracheal soft tissue mass extending from the right apex to the right suprahilar and precarinal region. Brain metastasis. Surgeries: No thoracic. 08/30/2020 stereotactic left frontoparietal craniotomy for resection of tumor. Chemotherapy: Yes; Ongoing?  No; Most recent administration: 02/2021 Immunotherapy?  Yes; Type: Keytruda; Ongoing? No Radiation therapy? Yes; Date Range: 01/17/2020 - 03/02/2020; Target: Right lung EXAM: CT CHEST WITH CONTRAST TECHNIQUE: Multidetector CT imaging of the chest was performed during intravenous contrast administration. RADIATION DOSE REDUCTION: This exam was performed according to the departmental dose-optimization program which includes automated exposure control, adjustment of the mA and/or kV according to patient size and/or use of iterative reconstruction technique.  CONTRAST:  64mL OMNIPAQUE IOHEXOL 300 MG/ML  SOLN COMPARISON:  Most recent CT chest, abdomen and pelvis 02/08/2021. 01/11/2020 PET-CT. FINDINGS: Cardiovascular: Port in the anterior chest wall with tip in distal SVC. Mediastinum/Nodes: Fluid within the esophagus. LEFT lower paratracheal node measures 8 mm. Lungs/Pleura: Nodule in the LEFT lower lobe is non crease to mass size. Mass measures 3.94.0 cm compared to 2.5 x 2.1 cm. Adjacent nodule measuring 2.6 x 2.1 cm is also increased from 1.6 1.4 cm Nodule in the LEFT upper lobe is increased in size measuring 1.7 cm (image 50/5 compared to 0.6 cm Centrilobular and paraseptal emphysema LEFT upper lobe. Resolution of the previously seen 8 mm nodule in the RIGHT lower lobe. Upper Abdomen: Limited view of the liver, kidneys, pancreas are unremarkable. Normal adrenal glands. Musculoskeletal: No aggressive osseous lesion. IMPRESSION: 1. Significant enlargement of adjacent pulmonary nodules in the LEFT lower lobe. One nodule is now mass size. 2. Enlargement nodule in LEFT upper lobe. 3. Resolution of RIGHT lobe pulmonary nodule. Electronically Signed   By: Suzy Bouchard M.D.   On: 06/11/2021 12:51   MR BRAIN W WO CONTRAST  Result Date: 06/30/2021 CLINICAL DATA:  History of lung cancer and brain metastases. EXAM: MRI HEAD WITHOUT AND WITH CONTRAST TECHNIQUE: Multiplanar, multiecho pulse sequences of the brain and surrounding structures were obtained without and with intravenous contrast. CONTRAST:  60mL GADAVIST GADOBUTROL 1 MMOL/ML IV SOLN COMPARISON:  04/01/2021 FINDINGS: BRAIN New Lesions: None. Larger lesions: None. Stable or Smaller lesions: Significantly subsided enhancing lesion at the left occipital parietal sulcus measuring 7 mm today as compared to nearly 2 cm previously. At the left parietal resection site linear superficial enhancement remains postoperative appearing. 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 Chronic lacunar infarct at the right caudate head. Cerebral volume loss which is generalized. Significantly subsided vasogenic edema in the left hemisphere. Vascular: Normal flow voids and vascular enhancements. Skull and upper cervical spine: No evidence of metastasis. Unremarkable craniotomies. Sinuses/Orbits: Negative IMPRESSION: 1. Regressed enhancement and edema at the left occipital parietal lesion. 2. Stable left frontal parietal resection site with enhancement that is postoperative appearing. 3. No new lesion. Electronically Signed   By: Gilford Silvius.D.  On: 06/30/2021 05:46    [place] [facility] Clinician Interpretation: I have personally reviewed the radiological images as listed.  My interpretation, in the context of the patient's clinical presentation, is stable disease   Assessment/Plan Brain metastasis ([place])  Seizures ([place])  [name] [name] is clinically stable today with regards to focal neurologic deficits and focal seizures.  His MRI demonstrates good control of disease, no further progression noted.  New onset neck pain is of unclear etiology, but given progression of symptoms in short time period in patient with active metastatic malignancy, imaging is warranted.  We recommended obtaining a contrast enhanced MRI cervical spine for review.  We recommended continuing Keppra 1000mg  BID.    Decadron should decrease tto 1mg  daily x10 days, then STOP if tolerated.  He is currently taking Klonopin 0.25mg  daily (unclear why this was started in the hospital), but he may stop this as well.  We ask that [name] [name] return to clinic in 3 months following next brain MRI, or sooner as needed.  Will con't to follow with Dr. Julien Nordmann for systemic therapy.    We will give him a call in a week or so once C-spine study is complete and reviewed by tumor board.  We spent twenty additional minutes teaching regarding the natural history, biology, and  historical experience in the treatment of neurologic complications of cancer.   We appreciate the opportunity to participate in the care of Nikholas Geffre.   All questions were answered. The patient knows to call the clinic with any problems, questions or concerns. No barriers to learning were detected.  The total time spent in the encounter was 40 minutes and more than 50% was on counseling and review of test results   Ventura Sellers, MD Medical Director of Neuro-Oncology [facility] 07/01/21 11:07 AM

## 2021-07-01 NOTE — Progress Notes (Signed)
.[place] [phone]   [phone]  OFFICE PROGRESS NOTE  [name], NP [id number] [id number]  DIAGNOSIS: Metastatic non-small cell lung cancer initially diagnosed as stage IIIA (Tx, N2, M0) non-small cell lung cancer, adenocarcinoma diagnosed in August 2021 and presented with large right paratracheal soft tissue mass extending from the right apex to the right suprahilar and precarinal region.  Molecular studies by [employer]:  [id number], 31.9%, Olaparib  [id number], 68.6%. None   BRAFAmplification, High (+++) Plasma Copy Number: 5.0  PRIOR THERAPY:  1) Concurrent chemoradiation with weekly carboplatin for AUC of 2 and paclitaxel 45 mg/M2.  First dose January 17, 2020.  Status post 7 cycles.  Last dose was given on February 28, 2020 2) Consolidation treatment with immunotherapy with Imfinzi 1500 mg IV every 4 weeks.  First dose April 09, 2020.  Status post 5 cycles.  Last dose was given July 30, 2020 discontinued secondary to disease progression with brain metastasis. 3) Stereotactic left frontoparietal craniotomy for resection of tumor on 08/30/2020. 4) First-line systemic chemotherapy with carboplatin for AUC of 5, Alimta 500 Mg/M2 and Keytruda 200 Mg IV every 3 weeks.  First dose Oct 08, 2020.  Status post 9 cycles.  Starting from cycle #5 he will be on maintenance treatment with Alimta and Keytruda every 3 weeks.   CURRENT THERAPY: Second line systemic chemotherapy with reduced dose docetaxel 65 Mg/M2 and Cyramza 10 Mg/KG every 3 weeks with Neulasta support.  First dose June 24, 2021.  Status post 1 cycle.  INTERVAL HISTORY: [name] [name] 66 y.o. male returns to the clinic today for follow-up visit.  The patient is feeling fine today with no concerning complaints except for mild pain in the right neck area started few days ago.  He did not take any medication for it.  He denied having any current  chest pain, shortness of breath, cough or hemoptysis.  He has no nausea, vomiting, diarrhea or constipation.  He has no headache or visual changes.  He tolerated the first week of his treatment with docetaxel and Cyramza fairly well.  He is here today for evaluation and repeat blood work.   MEDICAL HISTORY: Past Medical History:  Diagnosis Date   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[place]) 12/25/2019   Cancer ([place])    COPD (chronic obstructive pulmonary disease) ([place]) 11/02/2007   Qualifier: Diagnosis of  By: Melvyn Novas MD, Christena Deem    Hypertension 12/24/2019   Iron deficiency anemia 08/23/2007   Qualifier: Diagnosis of  By: Jim Like      ALLERGIES:  is allergic to other.  MEDICATIONS:  Current Outpatient Medications  Medication Sig Dispense Refill   acetaminophen (TYLENOL) 500 MG tablet Take 500 mg by mouth every 6 (six) hours as needed for mild pain or moderate pain.     budesonide-formoterol (SYMBICORT) 80-4.5 MCG/ACT inhaler Inhale 2 puffs into the lungs daily. 10.2 g 0   clonazePAM (KLONOPIN) 0.5 MG tablet take 1/2 tab by mouth twice daily 15 tablet 0   dexamethasone (DECADRON) 2 MG tablet Take 1 tablet (2 mg total) by mouth daily. 30 tablet 0   dexamethasone (DECADRON) 4 MG tablet Take 2 tablets by mouth twice daily the day before day of and day after the chemotherapy every 3 weeks. 40 tablet 1   feeding supplement (ENSURE ENLIVE / ENSURE PLUS) LIQD Take 237 mLs by mouth 2 (two) times daily between meals. 177 mL 12   folic acid (  FOLVITE) 1 MG tablet Take 1 tablet (1 mg total) by mouth daily. 30 tablet 0   ipratropium-albuterol (DUONEB) 0.5-2.5 (3) MG/3ML SOLN Take 3 mLs by nebulization every 4 (four) hours as needed. 360 mL    levETIRAcetam (KEPPRA) 1000 MG tablet Take 1 tablet (1,000 mg total) by mouth 2 (two) times daily. 60 tablet 2   lidocaine-prilocaine (EMLA) cream Apply 1 application topically as needed. 30 g 1   Multiple Vitamin (MULTIVITAMIN) tablet Take 1 tablet by mouth daily.      nicotine polacrilex (COMMIT) 2 MG lozenge Take 2 mg by mouth as needed.     pantoprazole (PROTONIX) 40 MG tablet Take 1 tablet (40 mg total) by mouth daily. 30 tablet 0   polyethylene glycol (MIRALAX / GLYCOLAX) 17 g packet Take 17 g by mouth daily. 14 each 0   prochlorperazine (COMPAZINE) 10 MG tablet Take 1 tablet (10 mg total) by mouth every 6 hours for nausea 30 tablet 0   senna (SENOKOT) 8.6 MG TABS tablet Take 1 tablet (8.6 mg total) by mouth 2 (two) times daily. 120 tablet 0   tiotropium (SPIRIVA HANDIHALER) 18 MCG inhalation capsule Place 1 capsule into inhaler and inhale daily. 30 capsule 1   umeclidinium bromide (INCRUSE ELLIPTA) 62.5 MCG/ACT AEPB Inhale 1 puff into the lungs daily. 30 each 0   VENTOLIN HFA 108 (90 Base) MCG/ACT inhaler Inhale 2 puffs into the lungs every 4 (four) hours as needed for wheezing or shortness of breath.     No current facility-administered medications for this visit.    SURGICAL HISTORY:  Past Surgical History:  Procedure Laterality Date   APPLICATION OF CRANIAL NAVIGATION N/A 08/30/2020   Procedure: APPLICATION OF CRANIAL NAVIGATION;  Surgeon: Consuella Lose, MD;  Location: [facility];  Service: Neurosurgery;  Laterality: N/A;   CRANIOTOMY Left 08/30/2020   Procedure: Stereotactic left frontoparietal craniectomy for resection of tumor with brainlab;  Surgeon: Consuella Lose, MD;  Location: [facility];  Service: Neurosurgery;  Laterality: Left;   ENDOBRONCHIAL ULTRASOUND N/A 12/27/2019   Procedure: ENDOBRONCHIAL ULTRASOUND;  Surgeon: Laurin Coder, MD;  Location: [facility];  Service: Endoscopy;  Laterality: N/A;   FINE NEEDLE ASPIRATION  12/27/2019   Procedure: FINE NEEDLE ASPIRATION;  Surgeon: Laurin Coder, MD;  Location: [facility];  Service: Endoscopy;;   IR IMAGING GUIDED PORT INSERTION  11/30/2020   VIDEO BRONCHOSCOPY N/A 12/27/2019   Procedure: VIDEO BRONCHOSCOPY WITHOUT FLUORO;  Surgeon: Laurin Coder, MD;  Location: [facility];   Service: Endoscopy;  Laterality: N/A;    REVIEW OF SYSTEMS:  A comprehensive review of systems was negative except for: Musculoskeletal: positive for neck pain   PHYSICAL EXAMINATION: General appearance: alert, cooperative, fatigued, and no distress Head: Normocephalic, without obvious abnormality, atraumatic Neck: no adenopathy, no JVD, supple, symmetrical, trachea midline, and thyroid not enlarged, symmetric, no tenderness/mass/nodules Lymph nodes: Cervical, supraclavicular, and axillary nodes normal. Resp: clear to auscultation bilaterally Back: symmetric, no curvature. ROM normal. No CVA tenderness. Cardio: regular rate and rhythm, S1, S2 normal, no murmur, click, rub or gallop GI: soft, non-tender; bowel sounds normal; no masses,  no organomegaly Extremities: extremities normal, atraumatic, no cyanosis or edema  ECOG PERFORMANCE STATUS: 1 - Symptomatic but completely ambulatory  Blood pressure 105/71, pulse (!) 115, temperature (!) 97.1 F (36.2 C), temperature source Tympanic, resp. rate 19, height 5' (1.524 m), weight 124 lb 12.8 oz (56.6 kg), SpO2 98 %.  LABORATORY DATA: Lab Results  Component Value Date   WBC  2.9 (L) 07/01/2021   HGB 11.2 (L) 07/01/2021   HCT 35.6 (L) 07/01/2021   MCV 77.2 (L) 07/01/2021   PLT 210 07/01/2021      Chemistry      Component Value Date/Time   NA 138 07/01/2021 0923   NA 144 08/03/2019 1527   K 3.7 07/01/2021 0923   CL 102 07/01/2021 0923   CO2 30 07/01/2021 0923   BUN 12 07/01/2021 0923   BUN 9 08/03/2019 1527   CREATININE 0.75 07/01/2021 0923      Component Value Date/Time   CALCIUM 9.4 07/01/2021 0923   ALKPHOS 63 07/01/2021 0923   AST 14 (L) 07/01/2021 0923   ALT 13 07/01/2021 0923   BILITOT 0.5 07/01/2021 1829       RADIOGRAPHIC STUDIES: CT Chest W Contrast  Result Date: 06/11/2021 CLINICAL DATA:  Primary Cancer Type: Lung Imaging Indication: Assess response to therapy Interval therapy since last imaging? Yes Initial  Cancer Diagnosis Date: 12/27/2019; Established by: Biopsy-proven Detailed Pathology: Initial diagnosis stage IIIA non-small cell lung cancer, adenocarcinoma. Primary Tumor location: Paratracheal soft tissue mass extending from the right apex to the right suprahilar and precarinal region. Brain metastasis. Surgeries: No thoracic. 08/30/2020 stereotactic left frontoparietal craniotomy for resection of tumor. Chemotherapy: Yes; Ongoing?  No; Most recent administration: 02/2021 Immunotherapy?  Yes; Type: Keytruda; Ongoing? No Radiation therapy? Yes; Date Range: 01/17/2020 - 03/02/2020; Target: Right lung EXAM: CT CHEST WITH CONTRAST TECHNIQUE: Multidetector CT imaging of the chest was performed during intravenous contrast administration. RADIATION DOSE REDUCTION: This exam was performed according to the departmental dose-optimization program which includes automated exposure control, adjustment of the mA and/or kV according to patient size and/or use of iterative reconstruction technique. CONTRAST:  55mL OMNIPAQUE IOHEXOL 300 MG/ML  SOLN COMPARISON:  Most recent CT chest, abdomen and pelvis 02/08/2021. 01/11/2020 PET-CT. FINDINGS: Cardiovascular: Port in the anterior chest wall with tip in distal SVC. Mediastinum/Nodes: Fluid within the esophagus. LEFT lower paratracheal node measures 8 mm. Lungs/Pleura: Nodule in the LEFT lower lobe is non crease to mass size. Mass measures 3.94.0 cm compared to 2.5 x 2.1 cm. Adjacent nodule measuring 2.6 x 2.1 cm is also increased from 1.6 1.4 cm Nodule in the LEFT upper lobe is increased in size measuring 1.7 cm (image 50/5 compared to 0.6 cm Centrilobular and paraseptal emphysema LEFT upper lobe. Resolution of the previously seen 8 mm nodule in the RIGHT lower lobe. Upper Abdomen: Limited view of the liver, kidneys, pancreas are unremarkable. Normal adrenal glands. Musculoskeletal: No aggressive osseous lesion. IMPRESSION: 1. Significant enlargement of adjacent pulmonary nodules in  the LEFT lower lobe. One nodule is now mass size. 2. Enlargement nodule in LEFT upper lobe. 3. Resolution of RIGHT lobe pulmonary nodule. Electronically Signed   By: Suzy Bouchard M.D.   On: 06/11/2021 12:51   MR BRAIN W WO CONTRAST  Result Date: 06/30/2021 CLINICAL DATA:  History of lung cancer and brain metastases. EXAM: MRI HEAD WITHOUT AND WITH CONTRAST TECHNIQUE: Multiplanar, multiecho pulse sequences of the brain and surrounding structures were obtained without and with intravenous contrast. CONTRAST:  8mL GADAVIST GADOBUTROL 1 MMOL/ML IV SOLN COMPARISON:  04/01/2021 FINDINGS: BRAIN New Lesions: None. Larger lesions: None. Stable or Smaller lesions: Significantly subsided enhancing lesion at the left occipital parietal sulcus measuring 7 mm today as compared to nearly 2 cm previously. At the left parietal resection site linear superficial enhancement remains postoperative appearing. 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 Chronic lacunar infarct at the right caudate head. Cerebral volume loss which is generalized. Significantly subsided vasogenic edema in the left hemisphere. Vascular: Normal flow voids and vascular enhancements. Skull and upper cervical spine: No evidence of metastasis. Unremarkable craniotomies. Sinuses/Orbits: Negative IMPRESSION: 1. Regressed enhancement and edema at the left occipital parietal lesion. 2. Stable left frontal parietal resection site with enhancement that is postoperative appearing. 3. No new lesion. Electronically Signed   By: Jorje Guild M.D.   On: 06/30/2021 05:46    ASSESSMENT AND PLAN: This is a very pleasant 66 years old African-American male recently diagnosed with metastatic non-small cell lung cancer that was initially diagnosed as stage IIIA (Tx, N2, M0) non-small cell lung cancer, adenocarcinoma presented with large right paratracheal soft tissue mass  extending from the right apex to the right suprahilar and precarinal region diagnosed in August 2021.  The patient developed brain metastasis in April 2022. Molecular studies by [employer] showed no actionable mutations. The patient completed a course of concurrent chemoradiation with weekly carboplatin for AUC of 2 and paclitaxel 45 mg/M2.  He is status post 7 cycles.  Last dose was given February 28, 2020 The patient tolerated the previous treatment well and he had partial response. The patient is currently undergoing consolidation treatment with immunotherapy with Imfinzi 1500 mg IV every 4 weeks status post 5 cycles. 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 The patient is here today to start the first cycle of systemic chemotherapy with carboplatin for AUC of 5, Alimta 500 Mg/M2 and Keytruda 200 Mg IV every 3 weeks.  Status post 8 cycles.  Starting from cycle #5 the patient will be on maintenance treatment with Alimta and Keytruda every 3 weeks. His last treatment was given in the middle of October 2022. The patient has been off treatment since that time because of his encephalopathy and seizure activity followed by rehabilitation. He had repeat CT scan of the chest performed recently.  I personally and independently reviewed the scan images and discussed the results with the patient and his caregiver. Unfortunately his CT scan of the chest showed evidence for disease progression with significant enlargement of pulmonary nodules in the left lower lobe and left upper lobe.   The patient is currently undergoing second line systemic chemotherapy with reduced dose docetaxel 65 Mg/M2 and Cyramza 10 Mg/KG every 3 weeks with Neulasta support status post 1 cycle started last week.  He tolerated the first week of his treatment fairly well. I recommended for the patient to continue his treatment with the same regimen and he is expected to start cycle #2  in 2 weeks. For the history of seizure and brain metastasis, he will continue his current treatment with Keppra and taper dose of Decadron as prescribed by Dr. Mickeal Skinner. For the neck pain he will try Tylenol or ibuprofen for now. He was advised to call immediately if he has any other concerning symptoms in the interval. The patient voices understanding of current disease status and treatment options and is in agreement with the current care plan.  All questions were answered. The patient knows to call the clinic with any problems, questions or concerns. We can certainly see the patient much sooner if necessary.  Disclaimer: This note was dictated with voice recognition software. Similar sounding words can inadvertently be transcribed and may not be corrected upon review.

## 2021-07-01 NOTE — Progress Notes (Signed)
[facility]  [phone] Fax:(336) [phone]   Name: [name] [name] Date: 07/01/2021 MRN: [id number]  DOB: May 27, 1955  Patient Care Team: [name], NP as PCP - General (Internal Medicine) Curt Bears, MD as Consulting Physician (Oncology) Brunetta Genera, MD as Consulting Physician (Hematology) Maryanna Shape, NP as Nurse Practitioner (Oncology) Pickenpack-Cousar, [name] Sax, NP as Nurse Practitioner (Nurse Practitioner)    INTERVAL HISTORY: [name] [name] is a 66 y.o. male with medical history of COPD, hypertension, and metastatic NSCLC s/p chemotherapy/immunotherapy, craniotomy (08/2020) w/p SRS to brain.  Palliative ask to follow for ongoing goals of care support.  SOCIAL HISTORY:     reports that he has quit smoking. His smoking use included cigarettes. He smoked an average of .5 packs per day. He has never used smokeless tobacco. He reports that he does not currently use alcohol. He reports that he does not use drugs.  ADVANCE DIRECTIVES:  On file. [name] [name] is documented Health Care Power of Attorney  CODE STATUS: Full code  PAST MEDICAL HISTORY: Past Medical History:  Diagnosis Date   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[place]) 12/25/2019   Cancer ([place])    COPD (chronic obstructive pulmonary disease) ([place]) 11/02/2007   Qualifier: Diagnosis of  By: Melvyn Novas MD, Christena Deem    Hypertension 12/24/2019   Iron deficiency anemia 08/23/2007   Qualifier: Diagnosis of  By: Jim Like      ALLERGIES:  is allergic to other.  MEDICATIONS:  Current Outpatient Medications  Medication Sig Dispense Refill   acetaminophen (TYLENOL) 500 MG tablet Take 500 mg by mouth every 6 (six) hours as needed for mild pain or moderate pain.     budesonide-formoterol (SYMBICORT) 80-4.5 MCG/ACT inhaler Inhale 2 puffs into the lungs daily. 10.2 g 0   dexamethasone (DECADRON) 1 MG tablet Take 1 tablet (1 mg total) by mouth daily for 10 days. 10  tablet 0   dexamethasone (DECADRON) 4 MG tablet Take 2 tablets by mouth twice daily the day before day of and day after the chemotherapy every 3 weeks. 40 tablet 1   feeding supplement (ENSURE ENLIVE / ENSURE PLUS) LIQD Take 237 mLs by mouth 2 (two) times daily between meals. 735 mL 12   folic acid (FOLVITE) 1 MG tablet Take 1 tablet (1 mg total) by mouth daily. 30 tablet 0   ipratropium-albuterol (DUONEB) 0.5-2.5 (3) MG/3ML SOLN Take 3 mLs by nebulization every 4 (four) hours as needed. 360 mL    levETIRAcetam (KEPPRA) 1000 MG tablet Take 1 tablet (1,000 mg total) by mouth 2 (two) times daily. 60 tablet 2   lidocaine-prilocaine (EMLA) cream Apply 1 application topically as needed. 30 g 1   Multiple Vitamin (MULTIVITAMIN) tablet Take 1 tablet by mouth daily.     nicotine polacrilex (COMMIT) 2 MG lozenge Take 2 mg by mouth as needed.     pantoprazole (PROTONIX) 40 MG tablet Take 1 tablet (40 mg total) by mouth daily. 30 tablet 0   polyethylene glycol (MIRALAX / GLYCOLAX) 17 g packet Take 17 g by mouth daily. 14 each 0   prochlorperazine (COMPAZINE) 10 MG tablet Take 1 tablet (10 mg total) by mouth every 6 hours for nausea 30 tablet 0   senna (SENOKOT) 8.6 MG TABS tablet Take 1 tablet (8.6 mg total) by mouth 2 (two) times daily. 120 tablet 0   tiotropium (SPIRIVA HANDIHALER) 18 MCG inhalation capsule Place 1 capsule into inhaler and inhale daily. 30 capsule 1  umeclidinium bromide (INCRUSE ELLIPTA) 62.5 MCG/ACT AEPB Inhale 1 puff into the lungs daily. 30 each 0   VENTOLIN HFA 108 (90 Base) MCG/ACT inhaler Inhale 2 puffs into the lungs every 4 (four) hours as needed for wheezing or shortness of breath.     No current facility-administered medications for this visit.    VITAL SIGNS: There were no vitals taken for this visit. There were no vitals filed for this visit.  Estimated body mass index is 24.43 kg/m as calculated from the following:   Height as of an earlier encounter on 07/01/21: 5'  (1.524 m).   Weight as of an earlier encounter on 07/01/21: 125 lb 1.6 oz (56.7 kg).   PERFORMANCE STATUS (ECOG) : 1 - Symptomatic but completely ambulatory  Physical Exam General: NAD, ambulatory with cane Cardio: RRR Resp: clear bilaterally Neurological: AAOx3  IMPRESSION:  Mr. [name] [name] presents to clinic today for follow-up. He is by himself. Reports transportation brought him to his appointments today. I called [name] [name] on speakerphone during our visit.  No acute distress noted.   [name] [name] reports he is doing much better.  Is able to perform most ADLs independently.  Does continue to endorse some weakness.  Is ambulatory with cane/walker. He is appreciative to be home and out of rehab.   Denies nausea, vomiting, or shortness of breath. Endorses some fatigue and pain in lower extremities which sometimes makes it difficult for him to sleep at night. He has tried Tylenol with minimal relief.   Education provided on ultram for possible relief of discomfort. Patient and [name] [name] verbalized understanding and expressed appreciation.   I reviewed future appointments that are scheduled with him.  Will review with [name] [name] verbalized understanding and appreciation for ongoing support.   PLAN: Tylenol as needed for pain/discomfort.  Tramadol 50 mg twice daily as needed for generalized pain.  Future appointments reviewed. I will plan to see patient back in 2-3 weeks for ongoing support.   Patient expressed understanding and was in agreement with this plan. He also understands that He can call the clinic at any time with any questions, concerns, or complaints.   Time Total: 35 min.   Visit consisted of counseling and education dealing with the complex and emotionally intense issues of symptom management and palliative care in the setting of serious and potentially life-threatening illness.Greater than 50%  of this time was spent counseling and coordinating care related to the above assessment and  plan.   Signed by: Alda Lea, AGPCNP-BC [place]

## 2021-07-02 ENCOUNTER — Other Ambulatory Visit: Payer: Self-pay | Admitting: Radiation Therapy

## 2021-07-02 ENCOUNTER — Encounter: Payer: Self-pay | Admitting: Speech Pathology

## 2021-07-02 ENCOUNTER — Ambulatory Visit: Payer: Medicaid Other | Admitting: Speech Pathology

## 2021-07-02 ENCOUNTER — Encounter: Payer: Self-pay | Admitting: Physical Therapy

## 2021-07-02 ENCOUNTER — Ambulatory Visit: Payer: Medicaid Other | Admitting: Physical Therapy

## 2021-07-02 ENCOUNTER — Other Ambulatory Visit: Payer: Self-pay

## 2021-07-02 DIAGNOSIS — R278 Other lack of coordination: Secondary | ICD-10-CM

## 2021-07-02 DIAGNOSIS — R2689 Other abnormalities of gait and mobility: Secondary | ICD-10-CM

## 2021-07-02 DIAGNOSIS — R41841 Cognitive communication deficit: Secondary | ICD-10-CM

## 2021-07-02 DIAGNOSIS — R262 Difficulty in walking, not elsewhere classified: Secondary | ICD-10-CM

## 2021-07-02 DIAGNOSIS — M6281 Muscle weakness (generalized): Secondary | ICD-10-CM

## 2021-07-02 DIAGNOSIS — R2681 Unsteadiness on feet: Secondary | ICD-10-CM

## 2021-07-02 NOTE — Therapy (Signed)
[place] [place], [place], [address] Phone: [phone]   Fax:  [phone]  Physical Therapy Treatment PHYSICAL THERAPY DISCHARGE SUMMARY  Visits from Start of Care: 10  Current functional level related to goals / functional outcomes: All goals met   Remaining deficits: See functional assessment   Education / Equipment: HEP   Patient agrees to discharge. Patient goals were met. Patient is being discharged due to being pleased with the current functional level.  Patient Details  Name: [name] [name] MRN: [id number] Date of Birth: 1955/06/12 No data recorded  Encounter Date: 07/02/2021   PT End of Session - 07/02/21 1352     Visit Number 10    Number of Visits 16    Date for PT Re-Evaluation 07/23/21    PT Start Time 1319    PT Stop Time 1352    PT Time Calculation (min) 33 min    Activity Tolerance Patient tolerated treatment well    Behavior During Therapy [facility] for tasks assessed/performed             Past Medical History:  Diagnosis Date   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[place]) 12/25/2019   Cancer ([place])    COPD (chronic obstructive pulmonary disease) ([place]) 11/02/2007   Qualifier: Diagnosis of  By: Melvyn Novas MD, Christena Deem    Hypertension 12/24/2019   Iron deficiency anemia 08/23/2007   Qualifier: Diagnosis of  By: Jim Like      Past Surgical History:  Procedure Laterality Date   APPLICATION OF CRANIAL NAVIGATION N/A 08/30/2020   Procedure: APPLICATION OF CRANIAL NAVIGATION;  Surgeon: Consuella Lose, MD;  Location: [place];  Service: Neurosurgery;  Laterality: N/A;   CRANIOTOMY Left 08/30/2020   Procedure: Stereotactic left frontoparietal craniectomy for resection of tumor with brainlab;  Surgeon: Consuella Lose, MD;  Location: [place];  Service: Neurosurgery;  Laterality: Left;   ENDOBRONCHIAL ULTRASOUND N/A 12/27/2019   Procedure: ENDOBRONCHIAL ULTRASOUND;  Surgeon: Laurin Coder, MD;  Location:  [facility];  Service: Endoscopy;  Laterality: N/A;   FINE NEEDLE ASPIRATION  12/27/2019   Procedure: FINE NEEDLE ASPIRATION;  Surgeon: Laurin Coder, MD;  Location: [facility];  Service: Endoscopy;;   IR IMAGING GUIDED PORT INSERTION  11/30/2020   VIDEO BRONCHOSCOPY N/A 12/27/2019   Procedure: VIDEO BRONCHOSCOPY WITHOUT FLUORO;  Surgeon: Laurin Coder, MD;  Location: [facility];  Service: Endoscopy;  Laterality: N/A;    There were no vitals filed for this visit.       [facility] PT Assessment - 07/02/21 0001       Ambulation/Gait   Ambulation/Gait Yes    Ambulation/Gait Assistance 7: Independent    Ambulation Distance (Feet) 500 Feet    Assistive device Rolling walker    Gait Pattern Step-through pattern    Ambulation Surface Level;Indoor    Gait Comments Ambulated 500' in 5:30. Much improved speed and stability, performed turns, held conversation while walking.      Standardized Balance Assessment   Five times sit to stand comments  16.56      Functional Gait  Assessment   Gait assessed  Yes                                    PT Education - 07/02/21 1351     Education Details Educated to continue to walk and practice HEP, especially the sit to stand and side stepping actiivites  for balance and strength.    Person(s) Educated Patient    Methods Explanation    Comprehension Verbalized understanding              PT Short Term Goals - 06/05/21 1512       PT SHORT TERM GOAL #1   Title I with basic HEP    Status Achieved               PT Long Term Goals - 07/02/21 1331       PT LONG TERM GOAL #1   Title I with final HEP      PT LONG TERM GOAL #2   Title Increase functoinal strength to allow 5 x sit to stand in < 20 seconds    Baseline 16.56    Status Achieved      PT LONG TERM GOAL #3   Title Improve TUG to < 15 seconds    Baseline < 15 sec    Status Achieved   15.60     PT LONG TERM GOAL #5   Title Patient will  ambulate x at least 400' with LRAD, MI, to increase his community mobility.    Baseline 500' with RW in 5:30.    Status Achieved                   Plan - 07/02/21 1335     Clinical Impression Statement Patient has particiapted well. Re-assessed functoinal status and he has met all LTG. He feels he is ready and able to continue to work on his own, ready for D/C. He did report new onset of R post neck pain, stating it hurts at night and sometimes if he turns his head too much. The Oncologist saw him recently and has ordered an MRI of the neck, so assessment and treatment withheld today pending results. Patient educated to request a new PT consult if the MRI is neg and he is still having neck pain.    Personal Factors and Comorbidities Comorbidity 1    Comorbidities lung Ca with brain mets    Examination-Activity Limitations Bathing;Locomotion Level;Transfers;Bed Mobility;Reach Overhead;Sit;Carry;Squat;Dressing;Stairs;Hygiene/Grooming;Toileting;Lift;Stand    Examination-Participation Restrictions Meal Prep;Cleaning;Driving;Interpersonal Relationship;Shop;Laundry    Stability/Clinical Decision Making Evolving/Moderate complexity    Clinical Decision Making Moderate    Rehab Potential Good    PT Frequency 2x / week    PT Duration 2 weeks    PT Treatment/Interventions ADLs/Self Care Home Management;Neuromuscular re-education;Balance training;Therapeutic exercise;Therapeutic activities;Functional mobility training;Stair training;Gait training;Passive range of motion;Manual techniques    PT Next Visit Plan Functional strength and endurance    PT Home Exercise Plan [id number]    Consulted and Agree with Plan of Care Patient             Patient will benefit from skilled therapeutic intervention in order to improve the following deficits and impairments:  Decreased endurance  Visit Diagnosis: Muscle weakness (generalized)  Other lack of coordination  Other abnormalities of gait and  mobility  Unsteadiness on feet  Difficulty in walking, not elsewhere classified     Problem List Patient Active Problem List   Diagnosis Date Noted   Gross hematuria 04/07/2021   Status 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 ([place]) 04/07/2021   Aspiration pneumonia of right lower lobe ([place]) 04/07/2021   Pericardial effusion 04/07/2021   Acute respiratory failure with hypoxia ([place])    Acute metabolic encephalopathy 34/07/5246   Seizures ([place]) 04/01/2021   Neutropenia ([place]) 12/17/2020   Brain mass 08/24/2020  Brain metastasis ([place]) 08/23/2020   Encounter for antineoplastic immunotherapy 04/02/2020   Chemotherapy induced neutropenia ([place]) 02/28/2020   Malignant neoplasm of bronchus of right upper lobe ([place]) 01/03/2020   Encounter for antineoplastic chemotherapy 01/03/2020   Goals of care, counseling/discussion 01/03/2020   Malnutrition of moderate degree 12/26/2019   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[place]) 12/25/2019   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 [place], [place], [address] Phone: [phone]   Fax:  [phone]  Name: [name] [name] MRN: [id number] Date of Birth: 1956-04-29
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
## 2021-07-04 ENCOUNTER — Ambulatory Visit (HOSPITAL_COMMUNITY)
Admission: RE | Admit: 2021-07-04 | Discharge: 2021-07-04 | Disposition: A | Discharge status: Home / Self Care | Payer: Medicaid Other | Source: Ambulatory Visit | Attending: Internal Medicine | Admitting: Internal Medicine

## 2021-07-04 ENCOUNTER — Other Ambulatory Visit: Payer: Self-pay

## 2021-07-04 DIAGNOSIS — C3411 Malignant neoplasm of upper lobe, right bronchus or lung: Secondary | ICD-10-CM | POA: Diagnosis not present

## 2021-07-04 MED ORDER — GADOBUTROL 1 MMOL/ML IV SOLN
5.0000 mL | Freq: Once | INTRAVENOUS | Status: AC | PRN
  Administered 2021-07-04: 5 mL via INTRAVENOUS

## 2021-07-08 ENCOUNTER — Inpatient Hospital Stay (HOSPITAL_BASED_OUTPATIENT_CLINIC_OR_DEPARTMENT_OTHER): Payer: Medicaid Other | Admitting: Internal Medicine

## 2021-07-08 ENCOUNTER — Ambulatory Visit
Admission: RE | Admit: 2021-07-08 | Discharge: 2021-07-08 | Disposition: A | Discharge status: Home / Self Care | Payer: Medicaid Other | Source: Ambulatory Visit | Attending: Radiation Oncology | Admitting: Radiation Oncology

## 2021-07-08 ENCOUNTER — Ambulatory Visit
Admission: RE | Admit: 2021-07-08 | Discharge: 2021-07-08 | Disposition: A | Discharge status: Home / Self Care | Payer: Medicaid Other | Source: Ambulatory Visit | Attending: Urology | Admitting: Urology

## 2021-07-08 ENCOUNTER — Inpatient Hospital Stay: Payer: Medicaid Other

## 2021-07-08 ENCOUNTER — Other Ambulatory Visit: Payer: Self-pay

## 2021-07-08 VITALS — BP 108/71 | HR 102 | Temp 99.0°F | Resp 18 | Wt 123.6 lb

## 2021-07-08 DIAGNOSIS — Z7951 Long term (current) use of inhaled steroids: Secondary | ICD-10-CM | POA: Insufficient documentation

## 2021-07-08 DIAGNOSIS — C7931 Secondary malignant neoplasm of brain: Secondary | ICD-10-CM

## 2021-07-08 DIAGNOSIS — Z79899 Other long term (current) drug therapy: Secondary | ICD-10-CM | POA: Insufficient documentation

## 2021-07-08 DIAGNOSIS — Z7952 Long term (current) use of systemic steroids: Secondary | ICD-10-CM | POA: Insufficient documentation

## 2021-07-08 DIAGNOSIS — C3411 Malignant neoplasm of upper lobe, right bronchus or lung: Secondary | ICD-10-CM | POA: Insufficient documentation

## 2021-07-08 DIAGNOSIS — J432 Centrilobular emphysema: Secondary | ICD-10-CM | POA: Insufficient documentation

## 2021-07-08 DIAGNOSIS — C7951 Secondary malignant neoplasm of bone: Secondary | ICD-10-CM

## 2021-07-08 DIAGNOSIS — M542 Cervicalgia: Secondary | ICD-10-CM | POA: Insufficient documentation

## 2021-07-08 NOTE — Progress Notes (Signed)
Histology and Location of Primary Cancer: Cervical spine mets  Sites of Visceral and Bony Metastatic Disease: C1/C2  Location(s) of Symptomatic Metastases: C1/C2  Past/Anticipated chemotherapy by medical oncology, if any:   Pain on a scale of 0-10 is: 10/10    If Spine Met(s), symptoms, if any, include: Bowel/Bladder retention or incontinence (please describe): No Numbness or weakness in extremities (please describe): No Current Decadron regimen, if applicable: Decadron 4 mg  Ambulatory status? Walker? Wheelchair?: walker  SAFETY ISSUES: Prior radiation? yes Pacemaker/ICD? No Possible current pregnancy? No Is the patient on methotrexate?   Current Complaints / other details:

## 2021-07-08 NOTE — Progress Notes (Signed)
Location/Histology of Brain Tumor: Brain metastasis.   Primary:  Adenocarcinoma of right lung, stage 3. Spinal mets   Patient presented with symptoms of:   Dr. Mickeal Skinner 07/01/2021  Follow-up MRI results. New onset throbbing neck pain, localized to the back and right side of his neck.  Past or anticipated interventions, if any, per neurosurgery:   Craniotomy (08/2020) w/p SRS to brain  Past or anticipated interventions, if any, per medical oncology:   Dr. Mickeal Skinner   07/01/2021  Recommended continuing Keppra 1000mg  BID.   Decadron should decrease to 1mg  daily x10 days, then STOP if tolerated.   He is currently taking Klonopin 0.25mg  daily (unclear why this was started in the hospital), but he may stop this as well.   Return to clinic in 3 months following next brain MRI, or sooner as needed.  Will con't to follow with Dr. Julien Nordmann for systemic therapy.     We will give him a call in a week or so once C-spine study is complete and reviewed by tumor board.   Dose of Decadron, if applicable: Decadron should decrease to 1mg  daily x10 days, then STOP if tolerated.  Recent neurologic symptoms, if any:  Seizures: No on medications Headaches: No Nausea: No Dizziness/ataxia: No Difficulty with hand coordination: No Focal numbness/weakness: No Visual deficits/changes: No Confusion/Memory deficits:  No  Painful bone metastases at present, if any: Neck pain 10/10 scale  SAFETY ISSUES: Prior radiation? Yes Pacemaker/ICD? No Possible current pregnancy? No Is the patient on methotrexate?  No  Additional Complaints / other details: Difficulty sleeping at night due to neck pain.

## 2021-07-08 NOTE — Progress Notes (Signed)
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 Spine metastasis ([place])  C79.51       Interval Since Last Radiation:  3 months 03/26/21:  SRS//PTV2: Left parietal 1.1 cm target was treated to a prescription dose of 20 Gy in a single fraction.   08/30/20 Pre-Op SRS//PTV1:  The 3.2 cm target in the left parietal vertex was treated to 18 Gy in a single fraction, followed by surgical resection with Dr. Kathyrn Sheriff on 08/31/20.   01/17/20 - 03/02/20: The patient was treated to the disease within the right lung initially to a dose of 60 Gy using a 3 field, IMRT technique. The patient then received a cone down boost treatment for an additional 6 Gy. This yielded a final total dose of 66 Gy.   Narrative:  The patient returns today with neck pain and recent MRI c-spine showed metastasis in the C1 right lateral mass since November 2022. There is extraosseous growth into the prevertebral space but no epidural infiltration or neural compression..                                ALLERGIES:  is allergic to other.  Meds: Current Outpatient Medications  Medication Sig Dispense Refill   acetaminophen (TYLENOL) 500 MG tablet Take 500 mg by mouth every 6 (six) hours as needed for mild pain or moderate pain.     budesonide-formoterol (SYMBICORT) 80-4.5 MCG/ACT inhaler Inhale 2 puffs into the lungs daily. 10.2 g 0   dexamethasone (DECADRON) 1 MG tablet Take 1 tablet (1 mg total) by mouth daily for 10 days. 10 tablet 0   dexamethasone (DECADRON) 4 MG tablet Take 2 tablets by mouth twice daily the day before day of and day after the chemotherapy every 3 weeks. 40 tablet 1   feeding supplement (ENSURE ENLIVE / ENSURE  PLUS) LIQD Take 237 mLs by mouth 2 (two) times daily between meals. 295 mL 12   folic acid (FOLVITE) 1 MG tablet Take 1 tablet (1 mg total) by mouth daily. 30 tablet 0   ipratropium-albuterol (DUONEB) 0.5-2.5 (3) MG/3ML SOLN Take 3 mLs by nebulization every 4 (four) hours as needed. 360 mL    levETIRAcetam (KEPPRA) 1000 MG tablet Take 1 tablet (1,000 mg total) by mouth 2 (two) times daily. 60 tablet 2   lidocaine-prilocaine (EMLA) cream Apply 1 application topically as needed. 30 g 1   Multiple Vitamin (MULTIVITAMIN) tablet Take 1 tablet by mouth daily.     nicotine polacrilex (COMMIT) 2 MG lozenge Take 2 mg by mouth as needed.     pantoprazole (PROTONIX) 40 MG tablet Take 1 tablet (40 mg total) by mouth daily. 30 tablet 0   polyethylene glycol (MIRALAX / GLYCOLAX) 17 g packet Take 17 g by mouth daily. 14 each 0   prochlorperazine (COMPAZINE) 10 MG tablet Take 1 tablet (10 mg total) by mouth every 6 hours for nausea 30 tablet 0   senna (SENOKOT) 8.6 MG TABS tablet Take 1 tablet (8.6 mg total) by mouth 2 (two) times daily. 120 tablet 0   tiotropium (SPIRIVA HANDIHALER) 18 MCG inhalation capsule Place  1 capsule into inhaler and inhale daily. 30 capsule 1   traMADol (ULTRAM) 50 MG tablet Take 1 tablet (50 mg total) by mouth 2 (two) times daily as needed for moderate pain. 30 tablet 0   umeclidinium bromide (INCRUSE ELLIPTA) 62.5 MCG/ACT AEPB Inhale 1 puff into the lungs daily. 30 each 0   VENTOLIN HFA 108 (90 Base) MCG/ACT inhaler Inhale 2 puffs into the lungs every 4 (four) hours as needed for wheezing or shortness of breath.     No current facility-administered medications for this visit.    Physical Findings: The patient is in no acute distress. Patient is alert and oriented.  vitals were not taken for this visit. .  No significant changes.  Lab Findings: Lab Results  Component Value Date   WBC 2.9 (L) 07/01/2021   WBC 10.0 04/14/2021   HGB 11.2 (L) 07/01/2021   HGB 12.3 (L) 08/03/2019    HCT 35.6 (L) 07/01/2021   HCT 38.5 08/03/2019   PLT 210 07/01/2021   PLT 320 08/03/2019    Lab Results  Component Value Date   NA 138 07/01/2021   NA 144 08/03/2019   K 3.7 07/01/2021   CO2 30 07/01/2021   GLUCOSE 85 07/01/2021   BUN 12 07/01/2021   BUN 9 08/03/2019   CREATININE 0.75 07/01/2021   BILITOT 0.5 07/01/2021   ALKPHOS 63 07/01/2021   AST 14 (L) 07/01/2021   ALT 13 07/01/2021   PROT 6.8 07/01/2021   PROT 7.3 08/03/2019   ALBUMIN 3.9 07/01/2021   ALBUMIN 4.6 08/03/2019   CALCIUM 9.4 07/01/2021   ANIONGAP 6 07/01/2021    Radiographic Findings: CT Chest W Contrast  Result Date: 06/11/2021 CLINICAL DATA:  Primary Cancer Type: Lung Imaging Indication: Assess response to therapy Interval therapy since last imaging? Yes Initial Cancer Diagnosis Date: 12/27/2019; Established by: Biopsy-proven Detailed Pathology: Initial diagnosis stage IIIA non-small cell lung cancer, adenocarcinoma. Primary Tumor location: Paratracheal soft tissue mass extending from the right apex to the right suprahilar and precarinal region. Brain metastasis. Surgeries: No thoracic. 08/30/2020 stereotactic left frontoparietal craniotomy for resection of tumor. Chemotherapy: Yes; Ongoing?  No; Most recent administration: 02/2021 Immunotherapy?  Yes; Type: Keytruda; Ongoing? No Radiation therapy? Yes; Date Range: 01/17/2020 - 03/02/2020; Target: Right lung EXAM: CT CHEST WITH CONTRAST TECHNIQUE: Multidetector CT imaging of the chest was performed during intravenous contrast administration. RADIATION DOSE REDUCTION: This exam was performed according to the departmental dose-optimization program which includes automated exposure control, adjustment of the mA and/or kV according to patient size and/or use of iterative reconstruction technique. CONTRAST:  24mL OMNIPAQUE IOHEXOL 300 MG/ML  SOLN COMPARISON:  Most recent CT chest, abdomen and pelvis 02/08/2021. 01/11/2020 PET-CT. FINDINGS: Cardiovascular: Port in the  anterior chest wall with tip in distal SVC. Mediastinum/Nodes: Fluid within the esophagus. LEFT lower paratracheal node measures 8 mm. Lungs/Pleura: Nodule in the LEFT lower lobe is non crease to mass size. Mass measures 3.94.0 cm compared to 2.5 x 2.1 cm. Adjacent nodule measuring 2.6 x 2.1 cm is also increased from 1.6 1.4 cm Nodule in the LEFT upper lobe is increased in size measuring 1.7 cm (image 50/5 compared to 0.6 cm Centrilobular and paraseptal emphysema LEFT upper lobe. Resolution of the previously seen 8 mm nodule in the RIGHT lower lobe. Upper Abdomen: Limited view of the liver, kidneys, pancreas are unremarkable. Normal adrenal glands. Musculoskeletal: No aggressive osseous lesion. IMPRESSION: 1. Significant enlargement of adjacent pulmonary nodules in the LEFT lower lobe. One nodule is now  mass size. 2. Enlargement nodule in LEFT upper lobe. 3. Resolution of RIGHT lobe pulmonary nodule. Electronically Signed   By: Suzy Bouchard M.D.   On: 06/11/2021 12:51   MR BRAIN W WO CONTRAST  Result Date: 06/30/2021 CLINICAL DATA:  History of lung cancer and brain metastases. EXAM: MRI HEAD WITHOUT AND WITH CONTRAST TECHNIQUE: Multiplanar, multiecho pulse sequences of the brain and surrounding structures were obtained without and with intravenous contrast. CONTRAST:  63mL GADAVIST GADOBUTROL 1 MMOL/ML IV SOLN COMPARISON:  04/01/2021 FINDINGS: BRAIN New Lesions: None. Larger lesions: None. Stable or Smaller lesions: Significantly subsided enhancing lesion at the left occipital parietal sulcus measuring 7 mm today as compared to nearly 2 cm previously. At the left parietal resection site linear superficial enhancement remains postoperative appearing. 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 Chronic lacunar infarct at the right caudate head. Cerebral volume loss which is generalized. Significantly subsided vasogenic  edema in the left hemisphere. Vascular: Normal flow voids and vascular enhancements. Skull and upper cervical spine: No evidence of metastasis. Unremarkable craniotomies. Sinuses/Orbits: Negative IMPRESSION: 1. Regressed enhancement and edema at the left occipital parietal lesion. 2. Stable left frontal parietal resection site with enhancement that is postoperative appearing. 3. No new lesion. Electronically Signed   By: Jorje Guild M.D.   On: 06/30/2021 05:46   MR CERVICAL SPINE W WO CONTRAST  Result Date: 07/04/2021 CLINICAL DATA:  Severe right shoulder and neck pain, evaluate for metastatic disease. EXAM: MRI CERVICAL SPINE WITHOUT AND WITH CONTRAST TECHNIQUE: Multiplanar and multiecho pulse sequences of the cervical spine, to include the craniocervical junction and cervicothoracic junction, were obtained without and with intravenous contrast. CONTRAST:  65mL GADAVIST GADOBUTROL 1 MMOL/ML IV SOLN COMPARISON:  Brain MRI from 6 days ago FINDINGS: Alignment: Physiologic Vertebrae: Marrow replacing lesion in the C1 right lateral mass which is new from April 01, 2021 brain MRI. There is growth through the ventral cortex into the prevertebral soft tissue. The foramen magnum is not covered on axial slices but was seen on preceding brain MRI and there is no spinal canal/epidural infiltration.If radiotherapy is planned, note that axial slices do not continue through this level. Accentuated fatty marrow conversion in the upper thoracic spine, usually radiation related. Cord: Normal signal and morphology. Posterior Fossa, vertebral arteries, paraspinal tissues: Brain reported separately. Disc levels: C2-3: Disc space narrowing and bulging.  Patent canal and foramina C3-4: Non segmentation. C4-5: Disc space narrowing with endplate and uncovertebral ridging. Biforaminal impingement. Patent spinal canal C5-6: Disc narrowing and bulging with mild facet spurring. Patent canal and foramina C6-7: Disc narrowing with  right eccentric uncovertebral ridging and possible protrusion. Right foraminal impingement. C7-T1:Mild facet spurring.  No neural impingement. IMPRESSION: 1. Metastasis in the C1 right lateral mass since November 2022. There is extraosseous growth into the prevertebral space but no epidural infiltration or neural compression. 2. Degenerative foraminal impingement bilaterally at C4-5 and on the right at C6-7. Electronically Signed   By: Jorje Guild M.D.   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  We discussed the natural history of spinal metastases and general treatment, highlighting the role of radiotherapy in the management.  We discussed the available radiation techniques, and focused on the details of logistics and  delivery.  We reviewed the anticipated acute and late sequelae associated with radiation in this setting.  The patient was encouraged to ask questions that I answered to the best of my ability.  I filled out a patient counseling form during our discussion including treatment diagrams.  We retained a copy for our records.  The patient would like to proceed with radiation and will be scheduled for CT simulation.  I personally spent 20 minutes in this encounter including chart review, reviewing radiological studies, meeting face-to-face with the patient, entering orders and completing documentation.   _____________________________________  Sheral Apley Tammi Klippel, M.D.

## 2021-07-08 NOTE — Addendum Note (Signed)
Encounter addended by: Sharol Given, RN on: 07/08/2021 12:36 PM  Actions taken: Visit diagnoses modified

## 2021-07-08 NOTE — Progress Notes (Signed)
[place] [facility] [place], [place] [address] [phone]   Interval Evaluation  Date of Service: 07/08/21 Patient Name: [name] [name] Patient MRN: [id number] Patient DOB: 05-14-1956 Provider: Ventura Sellers, MD  Identifying Statement:  [name] [name] is a 66 y.o. male with Brain metastasis [facility])  Spine metastasis ([place])   Primary Cancer:  Oncologic History: Oncology History  Malignant neoplasm of bronchus of right upper lobe ([place])  01/03/2020 Initial Diagnosis   Adenocarcinoma of right lung, stage 3 ([place])   01/17/2020 - 02/28/2020 Chemotherapy   The patient had palonosetron (ALOXI) injection 0.25 mg, 0.25 mg, Intravenous,  Once, 7 of 7 cycles Administration: 0.25 mg (01/17/2020), 0.25 mg (02/14/2020), 0.25 mg (02/21/2020), 0.25 mg (01/24/2020), 0.25 mg (02/28/2020), 0.25 mg (01/31/2020), 0.25 mg (02/07/2020) CARBOplatin (PARAPLATIN) 190 mg in sodium chloride 0.9 % 250 mL chemo infusion, 190 mg (100 % of original dose 189.8 mg), Intravenous,  Once, 7 of 7 cycles Dose modification: 189.8 mg (original dose 189.8 mg, Cycle 1) Administration: 190 mg (01/17/2020), 190 mg (02/14/2020), 190 mg (02/21/2020), 190 mg (01/24/2020), 190 mg (02/28/2020), 190 mg (01/31/2020), 190 mg (02/07/2020) PACLitaxel (TAXOL) 66 mg in sodium chloride 0.9 % 150 mL chemo infusion (</= 80mg /m2), 45 mg/m2 = 66 mg, Intravenous,  Once, 7 of 7 cycles Administration: 66 mg (01/17/2020), 66 mg (02/14/2020), 66 mg (02/21/2020), 66 mg (01/24/2020), 66 mg (02/28/2020), 66 mg (01/31/2020), 66 mg (02/07/2020)   for chemotherapy treatment.     04/09/2020 - 07/30/2020 Chemotherapy          09/28/2020 Cancer Staging   Staging form: Lung, AJCC 8th Edition - Clinical: Stage IVA (cTX, cN2, cM1b) - Signed by Curt Bears, MD on 09/28/2020    10/08/2020 - 03/04/2021 Chemotherapy   Patient is on Treatment Plan : LUNG CARBOplatin / Pemetrexed / Pembrolizumab q21d Induction x 4 cycles /  Maintenance Pemetrexed + Pembrolizumab     06/25/2021 -  Chemotherapy   Patient is on Treatment Plan : LUNG Docetaxel + Ramucirumab q21d       CNS Oncologic History 08/30/20: Pre-Op SRS 3.2 cm target in the left parietal 18 Gy (MM) 08/31/20: Resection with Dr. Kathyrn Sheriff 03/26/21: Salvage SRS left parietal 1.1 cm target (MM)  Interval History: [name] [name] presents today for follow up after cervical spine MRI study.  He continues to describe the throbbing neck pain, still localized to the back and right side of his neck.  No symptoms radiating to arm or head; pain has been worsening over period of 3 weeks.  It remains aggravated by his position, turning the head.  Otherwise no new or progressive symptoms.  Still on 1mg  daily decadron, has stopped Klonopin.  Caregiver Peggy communicated via phone with [place] today.  H+P (04/30/21) Patient presents for evaluation following recent hospitalization for status epilepticus.  He was discharged with post-ictal encephalopathy to nursing/rehab facility.  Over the past week he continues to improve with regards to cognition, memory, engagement.  He is doing some walking at the center.  Has been dosing Keppra 1000mg  BID and Decadron 4mg  BID without issue.  Has upcoming appointment with palliative care later today.  Medications: Current Outpatient Medications on File Prior to Visit  Medication Sig Dispense Refill   acetaminophen (TYLENOL) 500 MG tablet Take 500 mg by mouth every 6 (six) hours as needed for mild pain or moderate pain.     budesonide-formoterol (SYMBICORT) 80-4.5 MCG/ACT inhaler Inhale 2 puffs into the lungs daily. 10.2 g  0   feeding supplement (ENSURE ENLIVE / ENSURE PLUS) LIQD Take 237 mLs by mouth 2 (two) times daily between meals. 053 mL 12   folic acid (FOLVITE) 1 MG tablet Take 1 tablet (1 mg total) by mouth daily. 30 tablet 0   ipratropium-albuterol (DUONEB) 0.5-2.5 (3) MG/3ML SOLN Take 3 mLs by nebulization every 4 (four) hours as needed.  360 mL    levETIRAcetam (KEPPRA) 1000 MG tablet Take 1 tablet (1,000 mg total) by mouth 2 (two) times daily. 60 tablet 2   lidocaine-prilocaine (EMLA) cream Apply 1 application topically as needed. 30 g 1   Multiple Vitamin (MULTIVITAMIN) tablet Take 1 tablet by mouth daily.     nicotine polacrilex (COMMIT) 2 MG lozenge Take 2 mg by mouth as needed.     pantoprazole (PROTONIX) 40 MG tablet Take 1 tablet (40 mg total) by mouth daily. 30 tablet 0   polyethylene glycol (MIRALAX / GLYCOLAX) 17 g packet Take 17 g by mouth daily. 14 each 0   prochlorperazine (COMPAZINE) 10 MG tablet Take 1 tablet (10 mg total) by mouth every 6 hours for nausea 30 tablet 0   senna (SENOKOT) 8.6 MG TABS tablet Take 1 tablet (8.6 mg total) by mouth 2 (two) times daily. 120 tablet 0   tiotropium (SPIRIVA HANDIHALER) 18 MCG inhalation capsule Place 1 capsule into inhaler and inhale daily. 30 capsule 1   traMADol (ULTRAM) 50 MG tablet Take 1 tablet (50 mg total) by mouth 2 (two) times daily as needed for moderate pain. 30 tablet 0   umeclidinium bromide (INCRUSE ELLIPTA) 62.5 MCG/ACT AEPB Inhale 1 puff into the lungs daily. 30 each 0   VENTOLIN HFA 108 (90 Base) MCG/ACT inhaler Inhale 2 puffs into the lungs every 4 (four) hours as needed for wheezing or shortness of breath.     dexamethasone (DECADRON) 1 MG tablet Take 1 tablet (1 mg total) by mouth daily for 10 days. 10 tablet 0   dexamethasone (DECADRON) 4 MG tablet Take 2 tablets by mouth twice daily the day before day of and day after the chemotherapy every 3 weeks. 40 tablet 1   No current facility-administered medications on file prior to visit.    Allergies:  Allergies  Allergen Reactions   Other Itching and Other (See Comments)    Seasonal allergies- Itchy eyes, runny nose, sneezing, scratchy throat   Past Medical History:  Past Medical History:  Diagnosis Date   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[place]) 12/25/2019   Cancer ([place])    COPD (chronic obstructive pulmonary disease)  ([place]) 11/02/2007   Qualifier: Diagnosis of  By: Melvyn Novas MD, Christena Deem    Hypertension 12/24/2019   Iron deficiency anemia 08/23/2007   Qualifier: Diagnosis of  By: Jim Like     Past Surgical History:  Past Surgical History:  Procedure Laterality Date   APPLICATION OF CRANIAL NAVIGATION N/A 08/30/2020   Procedure: APPLICATION OF CRANIAL NAVIGATION;  Surgeon: Consuella Lose, MD;  Location: [place];  Service: Neurosurgery;  Laterality: N/A;   CRANIOTOMY Left 08/30/2020   Procedure: Stereotactic left frontoparietal craniectomy for resection of tumor with brainlab;  Surgeon: Consuella Lose, MD;  Location: [facility];  Service: Neurosurgery;  Laterality: Left;   ENDOBRONCHIAL ULTRASOUND N/A 12/27/2019   Procedure: ENDOBRONCHIAL ULTRASOUND;  Surgeon: Laurin Coder, MD;  Location: [facility];  Service: Endoscopy;  Laterality: N/A;   FINE NEEDLE ASPIRATION  12/27/2019   Procedure: FINE NEEDLE ASPIRATION;  Surgeon: Laurin Coder, MD;  Location: [facility];  Service:  Endoscopy;;   IR IMAGING GUIDED PORT INSERTION  11/30/2020   VIDEO BRONCHOSCOPY N/A 12/27/2019   Procedure: VIDEO BRONCHOSCOPY WITHOUT FLUORO;  Surgeon: Laurin Coder, MD;  Location: [facility];  Service: Endoscopy;  Laterality: N/A;   Social History:  Social History   Socioeconomic History   Marital status: Single    Spouse name: Not on file   Number of children: Not on file   Years of education: Not on file   Highest education level: Not on file  Occupational History   Not on file  Tobacco Use   Smoking status: Former    Packs/day: 0.50    Types: Cigarettes   Smokeless tobacco: Never   Tobacco comments:    Pt states he stopped since he was discharged from the hospital  Vaping Use   Vaping Use: Never used  Substance and Sexual Activity   Alcohol use: Not Currently   Drug use: Never   Sexual activity: Not Currently  Other Topics Concern   Not on file  Social History Narrative   Not on file    Social Determinants of Health   Financial Resource Strain: Not on file  Food Insecurity: Not on file  Transportation Needs: Not on file  Physical Activity: Not on file  Stress: Not on file  Social Connections: Not on file  Intimate Partner Violence: Not on file   Family History:  Family History  Problem Relation Age of Onset   Hypertension Other     Review of Systems: Constitutional: Doesn't report fevers, chills or abnormal weight loss Eyes: Doesn't report blurriness of vision Ears, nose, mouth, throat, and face: Doesn't report sore throat Respiratory: Doesn't report cough, dyspnea or wheezes Cardiovascular: Doesn't report palpitation, chest discomfort  Gastrointestinal:  Doesn't report nausea, constipation, diarrhea GU: Doesn't report incontinence Skin: Doesn't report skin rashes Neurological: Per HPI Musculoskeletal: Doesn't report joint pain Behavioral/Psych: Doesn't report anxiety  Physical Exam: Vitals:   07/08/21 1120  BP: 108/71  Pulse: (!) 102  Resp: 18  Temp: 99 F (37.2 C)  SpO2: 99%    KPS: 70. General: Alert, cooperative, pleasant, in no acute distress Head: Normal EENT: +neck tenderness, right lateral Lungs: Resp effort normal Cardiac: Regular rate Abdomen: Non-distended abdomen Skin: No rashes cyanosis or petechiae. Extremities: No clubbing or edema  Neurologic Exam: Mental Status: Awake, alert, attentive to examiner. Oriented to self and environment. Language is fluent with intact comprehension. Age advanced psychomotor slowing, impaired insight and object recall  Cranial Nerves: Visual acuity is grossly normal. Visual fields are full. Extra-ocular movements intact. No ptosis. Face is symmetric Motor: Tone and bulk are normal. Power is full in both arms and legs. Reflexes are symmetric, no pathologic reflexes present.  Sensory: Intact to light touch Gait: Dystaxic.   Labs: I have reviewed the data as listed    Component Value Date/Time    NA 138 07/01/2021 0923   NA 144 08/03/2019 1527   K 3.7 07/01/2021 0923   CL 102 07/01/2021 0923   CO2 30 07/01/2021 0923   GLUCOSE 85 07/01/2021 0923   BUN 12 07/01/2021 0923   BUN 9 08/03/2019 1527   CREATININE 0.75 07/01/2021 0923   CALCIUM 9.4 07/01/2021 0923   PROT 6.8 07/01/2021 0923   PROT 7.3 08/03/2019 1527   ALBUMIN 3.9 07/01/2021 0923   ALBUMIN 4.6 08/03/2019 1527   AST 14 (L) 07/01/2021 0923   ALT 13 07/01/2021 0923   ALKPHOS 63 07/01/2021 0923   BILITOT 0.5 07/01/2021 4235  GFRNONAA >60 07/01/2021 0923   GFRAA >60 02/14/2020 0093   Lab Results  Component Value Date   WBC 2.9 (L) 07/01/2021   NEUTROABS 1.3 (L) 07/01/2021   HGB 11.2 (L) 07/01/2021   HCT 35.6 (L) 07/01/2021   MCV 77.2 (L) 07/01/2021   PLT 210 07/01/2021    Imaging:  CT Chest W Contrast  Result Date: 06/11/2021 CLINICAL DATA:  Primary Cancer Type: Lung Imaging Indication: Assess response to therapy Interval therapy since last imaging? Yes Initial Cancer Diagnosis Date: 12/27/2019; Established by: Biopsy-proven Detailed Pathology: Initial diagnosis stage IIIA non-small cell lung cancer, adenocarcinoma. Primary Tumor location: Paratracheal soft tissue mass extending from the right apex to the right suprahilar and precarinal region. Brain metastasis. Surgeries: No thoracic. 08/30/2020 stereotactic left frontoparietal craniotomy for resection of tumor. Chemotherapy: Yes; Ongoing?  No; Most recent administration: 02/2021 Immunotherapy?  Yes; Type: Keytruda; Ongoing? No Radiation therapy? Yes; Date Range: 01/17/2020 - 03/02/2020; Target: Right lung EXAM: CT CHEST WITH CONTRAST TECHNIQUE: Multidetector CT imaging of the chest was performed during intravenous contrast administration. RADIATION DOSE REDUCTION: This exam was performed according to the departmental dose-optimization program which includes automated exposure control, adjustment of the mA and/or kV according to patient size and/or use of iterative  reconstruction technique. CONTRAST:  6mL OMNIPAQUE IOHEXOL 300 MG/ML  SOLN COMPARISON:  Most recent CT chest, abdomen and pelvis 02/08/2021. 01/11/2020 PET-CT. FINDINGS: Cardiovascular: Port in the anterior chest wall with tip in distal SVC. Mediastinum/Nodes: Fluid within the esophagus. LEFT lower paratracheal node measures 8 mm. Lungs/Pleura: Nodule in the LEFT lower lobe is non crease to mass size. Mass measures 3.94.0 cm compared to 2.5 x 2.1 cm. Adjacent nodule measuring 2.6 x 2.1 cm is also increased from 1.6 1.4 cm Nodule in the LEFT upper lobe is increased in size measuring 1.7 cm (image 50/5 compared to 0.6 cm Centrilobular and paraseptal emphysema LEFT upper lobe. Resolution of the previously seen 8 mm nodule in the RIGHT lower lobe. Upper Abdomen: Limited view of the liver, kidneys, pancreas are unremarkable. Normal adrenal glands. Musculoskeletal: No aggressive osseous lesion. IMPRESSION: 1. Significant enlargement of adjacent pulmonary nodules in the LEFT lower lobe. One nodule is now mass size. 2. Enlargement nodule in LEFT upper lobe. 3. Resolution of RIGHT lobe pulmonary nodule. Electronically Signed   By: Suzy Bouchard M.D.   On: 06/11/2021 12:51   MR BRAIN W WO CONTRAST  Result Date: 06/30/2021 CLINICAL DATA:  History of lung cancer and brain metastases. EXAM: MRI HEAD WITHOUT AND WITH CONTRAST TECHNIQUE: Multiplanar, multiecho pulse sequences of the brain and surrounding structures were obtained without and with intravenous contrast. CONTRAST:  77mL GADAVIST GADOBUTROL 1 MMOL/ML IV SOLN COMPARISON:  04/01/2021 FINDINGS: BRAIN New Lesions: None. Larger lesions: None. Stable or Smaller lesions: Significantly subsided enhancing lesion at the left occipital parietal sulcus measuring 7 mm today as compared to nearly 2 cm previously. At the left parietal resection site linear superficial enhancement remains postoperative appearing. 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 Chronic lacunar infarct at the right caudate head. Cerebral volume loss which is generalized. Significantly subsided vasogenic edema in the left hemisphere. Vascular: Normal flow voids and vascular enhancements. Skull and upper cervical spine: No evidence of metastasis. Unremarkable craniotomies. Sinuses/Orbits: Negative IMPRESSION: 1. Regressed enhancement and edema at the left occipital parietal lesion. 2. Stable left frontal parietal resection site with enhancement that is postoperative appearing. 3. No new lesion. Electronically  Signed   By: Jorje Guild M.D.   On: 06/30/2021 05:46   MR CERVICAL SPINE W WO CONTRAST  Result Date: 07/04/2021 CLINICAL DATA:  Severe right shoulder and neck pain, evaluate for metastatic disease. EXAM: MRI CERVICAL SPINE WITHOUT AND WITH CONTRAST TECHNIQUE: Multiplanar and multiecho pulse sequences of the cervical spine, to include the craniocervical junction and cervicothoracic junction, were obtained without and with intravenous contrast. CONTRAST:  4mL GADAVIST GADOBUTROL 1 MMOL/ML IV SOLN COMPARISON:  Brain MRI from 6 days ago FINDINGS: Alignment: Physiologic Vertebrae: Marrow replacing lesion in the C1 right lateral mass which is new from April 01, 2021 brain MRI. There is growth through the ventral cortex into the prevertebral soft tissue. The foramen magnum is not covered on axial slices but was seen on preceding brain MRI and there is no spinal canal/epidural infiltration.If radiotherapy is planned, note that axial slices do not continue through this level. Accentuated fatty marrow conversion in the upper thoracic spine, usually radiation related. Cord: Normal signal and morphology. Posterior Fossa, vertebral arteries, paraspinal tissues: Brain reported separately. Disc levels: C2-3: Disc space narrowing and bulging.  Patent canal and foramina C3-4: Non segmentation. C4-5: Disc space  narrowing with endplate and uncovertebral ridging. Biforaminal impingement. Patent spinal canal C5-6: Disc narrowing and bulging with mild facet spurring. Patent canal and foramina C6-7: Disc narrowing with right eccentric uncovertebral ridging and possible protrusion. Right foraminal impingement. C7-T1:Mild facet spurring.  No neural impingement. IMPRESSION: 1. Metastasis in the C1 right lateral mass since November 2022. There is extraosseous growth into the prevertebral space but no epidural infiltration or neural compression. 2. Degenerative foraminal impingement bilaterally at C4-5 and on the right at C6-7. Electronically Signed   By: Jorje Guild M.D.   On: 07/04/2021 11:16    [place] Clinician Interpretation: I have personally reviewed the radiological images as listed.  My interpretation, in the context of the patient's clinical presentation, is progressive disease   Assessment/Plan Brain metastasis [facility])  Spine metastasis ([place])  [name] [name] presents today with clinical and radiographic syndrome consistent with spinous process metastasis, affecting posterior and right lateral aspects of C1.  This finding fits with his clinical complaints.  We discussed and recommended treating this with radiosurgery.  He is agreeable to meet with radiation oncology later today to review candidacy for this intervention.  We recommended continuing Keppra 1000mg  BID.    Can stop decadron once 10 days is complete as discussed.  We ask that [name] [name] return to clinic in 3 months following next brain MRI, or sooner as needed.  Will con't to follow with Dr. Julien Nordmann for systemic therapy.    We spent twenty additional minutes teaching regarding the natural history, biology, and historical experience in the treatment of neurologic complications of cancer.   We appreciate the opportunity to participate in the care of [name] [name].   All questions were answered. The patient knows to call the clinic  with any problems, questions or concerns. No barriers to learning were detected.  The total time spent in the encounter was 30 minutes and more than 50% was on counseling and review of test results   Ventura Sellers, MD Medical Director of Neuro-Oncology [facility] [place] 07/08/21 11:29 AM

## 2021-07-08 NOTE — Progress Notes (Addendum)
°  Radiation Oncology         [phone] ________________________________  Name: Islam Eichinger MRN: [id number]  Date: 07/08/2021  DOB: 1955/11/24  SIMULATION AND TREATMENT PLANNING NOTE    ICD-10-CM   1. Spine metastasis ([place])  C79.51       DIAGNOSIS:  66 yo man with painful C1 cervical spine metastasis from right upper lung adenocarcinoma  NARRATIVE:  The patient was brought to the [facility].  Identity was confirmed.  All relevant records and images related to the planned course of therapy were reviewed.  The patient freely provided informed written consent to proceed with treatment after reviewing the details related to the planned course of therapy. The consent form was witnessed and verified by the simulation staff.  Then, the patient was set-up in a stable reproducible  supine position for radiation therapy.  CT images were obtained.  Surface markings were placed.  The CT images were loaded into the planning software.  Then the target and avoidance structures were contoured.  Treatment planning then occurred.  The radiation prescription was entered and confirmed.  Then, I designed and supervised the construction of a total of 3 medically necessary complex treatment device including a custom made thermoplastic mask used for immobilization, and two MLC collimator apertures for radiotherapy from the right and left side, with independent collimation for each to account for beam divergence.  I have requested : Isodose Plan.    PLAN:  The C1 metastasis will be treated to 30 Gy in 10 fractions.  ________________________________  Sheral Apley Tammi Klippel, M.D.

## 2021-07-09 ENCOUNTER — Ambulatory Visit: Payer: Medicaid Other | Admitting: Physical Therapy

## 2021-07-09 ENCOUNTER — Inpatient Hospital Stay: Payer: Medicaid Other

## 2021-07-09 ENCOUNTER — Ambulatory Visit
Admission: RE | Admit: 2021-07-09 | Discharge: 2021-07-09 | Disposition: A | Discharge status: Home / Self Care | Payer: Medicaid Other | Source: Ambulatory Visit | Attending: Radiation Oncology | Admitting: Radiation Oncology

## 2021-07-09 ENCOUNTER — Ambulatory Visit: Payer: Medicaid Other | Admitting: Speech Pathology

## 2021-07-09 DIAGNOSIS — C7951 Secondary malignant neoplasm of bone: Secondary | ICD-10-CM | POA: Diagnosis not present

## 2021-07-10 ENCOUNTER — Ambulatory Visit
Admission: RE | Admit: 2021-07-10 | Discharge: 2021-07-10 | Disposition: A | Discharge status: Home / Self Care | Payer: Medicaid Other | Source: Ambulatory Visit | Attending: Radiation Oncology | Admitting: Radiation Oncology

## 2021-07-10 ENCOUNTER — Other Ambulatory Visit: Payer: Self-pay

## 2021-07-10 DIAGNOSIS — C7951 Secondary malignant neoplasm of bone: Secondary | ICD-10-CM | POA: Diagnosis not present

## 2021-07-11 ENCOUNTER — Ambulatory Visit
Admission: RE | Admit: 2021-07-11 | Discharge: 2021-07-11 | Disposition: A | Discharge status: Home / Self Care | Payer: Medicaid Other | Source: Ambulatory Visit | Attending: Radiation Oncology | Admitting: Radiation Oncology

## 2021-07-11 ENCOUNTER — Inpatient Hospital Stay: Payer: Medicaid Other

## 2021-07-11 DIAGNOSIS — C7951 Secondary malignant neoplasm of bone: Secondary | ICD-10-CM | POA: Diagnosis not present

## 2021-07-11 NOTE — Progress Notes (Signed)
[facility] OFFICE PROGRESS NOTE  [name], NP [address] [id number]  DIAGNOSIS: Metastatic non-small cell lung cancer initially diagnosed as stage IIIA (Tx, N2, M0) non-small cell lung cancer, adenocarcinoma diagnosed in August 2021 and presented with large right paratracheal soft tissue mass extending from the right apex to the right suprahilar and precarinal region.   Molecular studies by [employer] [employer]:   [id number], 31.9%, Olaparib   [id number], 68.6%. None        BRAFAmplification, High (+++) Plasma Copy Number: 5.0  PRIOR THERAPY: 1) Concurrent chemoradiation with weekly carboplatin for AUC of 2 and paclitaxel 45 mg/M2.  First dose January 17, 2020.  Status post 7 cycles.  Last dose was given on February 28, 2020 2) Consolidation treatment with immunotherapy with Imfinzi 1500 mg IV every 4 weeks.  First dose April 09, 2020.  Status post 5 cycles.  Last dose was given July 30, 2020 discontinued secondary to disease progression with brain metastasis. 3) Stereotactic left frontoparietal craniotomy for resection of tumor on 08/30/2020. 4) First-line systemic chemotherapy with carboplatin for AUC of 5, Alimta 500 Mg/M2 and Keytruda 200 Mg IV every 3 weeks.  First dose Oct 08, 2020.  Status post 9 cycles.  Starting from cycle #5 he will be on maintenance treatment with Alimta and Keytruda every 3 weeks.  CURRENT THERAPY: 1) Second line systemic chemotherapy with reduced dose docetaxel 65 Mg/M2 and Cyramza 10 Mg/KG every 3 weeks with Neulasta support.  First dose June 24, 2021.  Status post 1 cycle. 2) Palliative radiation to the C1 metastatic lesion under the care of Dr. Tammi Klippel. His last day of radiation is expected for 07/22/21.   INTERVAL HISTORY: [name] [name] 66 y.o. male returns to the clinic today for a follow-up visit.  The patient is feeling fair today without any concerning complaints.  He is currently undergoing palliative radiation  to C1 under the care of Dr. Tammi Klippel.  His last day of radiation is expected for 07/22/21.  He follows closely with radiation oncology and neuro-oncology.  He is currently followed by Dr. Mickeal Skinner due to his history of seizures.   Regarding his systemic treatment, the patient unfortunately was recently found to have evidence of disease progression, therefore, Dr. Julien Nordmann change his treatment to docetaxel and Cyramza.  The patient is status post 1 cycle. His treatment was 3 weeks ago. He thinks he tolerated it fine. He continues to have a decreased appetite and weight loss. He is drinking ensure BID.  He denies any fever, chills, or night sweats.   He denies any chest pain or hemoptysis. He states he "aint coughing that bad".    He reports some baseline dyspnea on exertion. Denies any diarrhea, constipation, nausea, or vomiting.  He takes tramadol for his neck pain.  Denies any abnormal bleeding or bruising.  The patient is here today for evaluation and repeat blood work before starting cycle #2.  MEDICAL HISTORY: Past Medical History:  Diagnosis Date   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[facility]) 12/25/2019   Cancer ([place])    COPD (chronic obstructive pulmonary disease) ([facility]) 11/02/2007   Qualifier: Diagnosis of  By: Melvyn Novas MD, Christena Deem    Hypertension 12/24/2019   Iron deficiency anemia 08/23/2007   Qualifier: Diagnosis of  By: Jim Like      ALLERGIES:  is allergic to other.  MEDICATIONS:  Current Outpatient Medications  Medication Sig Dispense Refill   acetaminophen (TYLENOL) 500 MG tablet Take 500 mg by mouth every 6 (  six) hours as needed for mild pain or moderate pain.     budesonide-formoterol (SYMBICORT) 80-4.5 MCG/ACT inhaler Inhale 2 puffs into the lungs daily. 10.2 g 0   dexamethasone (DECADRON) 4 MG tablet Take 2 tablets by mouth twice daily the day before day of and day after the chemotherapy every 3 weeks. (Patient not taking: Reported on 07/08/2021) 40 tablet 1   feeding supplement (ENSURE ENLIVE /  ENSURE PLUS) LIQD Take 237 mLs by mouth 2 (two) times daily between meals. 500 mL 12   folic acid (FOLVITE) 1 MG tablet Take 1 tablet (1 mg total) by mouth daily. 30 tablet 0   ipratropium-albuterol (DUONEB) 0.5-2.5 (3) MG/3ML SOLN Take 3 mLs by nebulization every 4 (four) hours as needed. 360 mL    levETIRAcetam (KEPPRA) 1000 MG tablet Take 1 tablet (1,000 mg total) by mouth 2 (two) times daily. 60 tablet 2   lidocaine-prilocaine (EMLA) cream Apply 1 application topically as needed. 30 g 1   Multiple Vitamin (MULTIVITAMIN) tablet Take 1 tablet by mouth daily.     nicotine polacrilex (COMMIT) 2 MG lozenge Take 2 mg by mouth as needed.     pantoprazole (PROTONIX) 40 MG tablet Take 1 tablet (40 mg total) by mouth daily. 30 tablet 0   polyethylene glycol (MIRALAX / GLYCOLAX) 17 g packet Take 17 g by mouth daily. 14 each 0   prochlorperazine (COMPAZINE) 10 MG tablet Take 1 tablet (10 mg total) by mouth every 6 hours for nausea (Patient not taking: Reported on 07/08/2021) 30 tablet 0   senna (SENOKOT) 8.6 MG TABS tablet Take 1 tablet (8.6 mg total) by mouth 2 (two) times daily. 120 tablet 0   tiotropium (SPIRIVA HANDIHALER) 18 MCG inhalation capsule Place 1 capsule into inhaler and inhale daily. 30 capsule 1   traMADol (ULTRAM) 50 MG tablet Take 1 tablet (50 mg total) by mouth 2 (two) times daily as needed for moderate pain. 30 tablet 0   umeclidinium bromide (INCRUSE ELLIPTA) 62.5 MCG/ACT AEPB Inhale 1 puff into the lungs daily. (Patient not taking: Reported on 07/08/2021) 30 each 0   VENTOLIN HFA 108 (90 Base) MCG/ACT inhaler Inhale 2 puffs into the lungs every 4 (four) hours as needed for wheezing or shortness of breath. (Patient not taking: Reported on 07/08/2021)     No current facility-administered medications for this visit.    SURGICAL HISTORY:  Past Surgical History:  Procedure Laterality Date   APPLICATION OF CRANIAL NAVIGATION N/A 08/30/2020   Procedure: APPLICATION OF CRANIAL NAVIGATION;   Surgeon: Consuella Lose, MD;  Location: [facility];  Service: Neurosurgery;  Laterality: N/A;   CRANIOTOMY Left 08/30/2020   Procedure: Stereotactic left frontoparietal craniectomy for resection of tumor with brainlab;  Surgeon: Consuella Lose, MD;  Location: [facility];  Service: Neurosurgery;  Laterality: Left;   ENDOBRONCHIAL ULTRASOUND N/A 12/27/2019   Procedure: ENDOBRONCHIAL ULTRASOUND;  Surgeon: Laurin Coder, MD;  Location: [facility];  Service: Endoscopy;  Laterality: N/A;   FINE NEEDLE ASPIRATION  12/27/2019   Procedure: FINE NEEDLE ASPIRATION;  Surgeon: Laurin Coder, MD;  Location: [facility];  Service: Endoscopy;;   IR IMAGING GUIDED PORT INSERTION  11/30/2020   VIDEO BRONCHOSCOPY N/A 12/27/2019   Procedure: VIDEO BRONCHOSCOPY WITHOUT FLUORO;  Surgeon: Laurin Coder, MD;  Location: [facility];  Service: Endoscopy;  Laterality: N/A;    REVIEW OF SYSTEMS:   Review of Systems  Constitutional: Positive for fatigue and decreased appetite. Negative for chills and fever.  HENT: Negative  for mouth sores, nosebleeds, sore throat and trouble swallowing.   Eyes: Negative for eye problems and icterus.  Respiratory: Positive for baseline dyspnea on exertion. Mild intermittent cough. Negative for hemoptysis and wheezing.   Cardiovascular: Negative for chest pain and leg swelling.  Gastrointestinal: Negative for abdominal pain, constipation, diarrhea, nausea and vomiting.  Genitourinary: Negative for bladder incontinence, difficulty urinating, dysuria, frequency and hematuria.   Musculoskeletal: Positive for cervical neck pain. Negative for back pain, gait problem, and neck stiffness.  Skin: Negative for itching and rash.  Neurological: Negative for dizziness, extremity weakness, gait problem, headaches, light-headedness and seizures.  Hematological: Negative for adenopathy. Does not bruise/bleed easily.  Psychiatric/Behavioral: Negative for confusion, depression and sleep  disturbance. The patient is not nervous/anxious.     PHYSICAL EXAMINATION:  Blood pressure 116/70, pulse 98, temperature 98.7 F (37.1 C), temperature source Tympanic, resp. rate 17, weight 115 lb 1 oz (52.2 kg), SpO2 96 %.  ECOG PERFORMANCE STATUS: 1-2  Physical Exam  Constitutional: Oriented to person, place, and time and thin appearing male and in no distress. HENT:  Head: Normocephalic and atraumatic.  Mouth/Throat: Oropharynx is clear and moist. No oropharyngeal exudate.  Eyes: Conjunctivae are normal. Right eye exhibits no discharge. Left eye exhibits no discharge. No scleral icterus.  Neck: Normal range of motion. Neck supple.  Cardiovascular: Normal rate, regular rhythm, normal heart sounds and intact distal pulses.   Pulmonary/Chest: Effort normal and breath sounds normal. No respiratory distress. No wheezes. No rales.  Abdominal: Soft. Bowel sounds are normal. Exhibits no distension and no mass. There is no tenderness.  Musculoskeletal: Normal range of motion. Exhibits no edema.  Lymphadenopathy:    No cervical adenopathy.  Neurological: Alert and oriented to person, place, and time. Exhibits muscle wasting. Ambulates with a walker.  Skin: Skin is warm and dry. No rash noted. Not diaphoretic. No erythema. No pallor.  Psychiatric: Mood, memory and judgment normal.  Vitals reviewed.  LABORATORY DATA: Lab Results  Component Value Date   WBC 16.2 (H) 07/15/2021   HGB 10.6 (L) 07/15/2021   HCT 33.0 (L) 07/15/2021   MCV 76.2 (L) 07/15/2021   PLT 341 07/15/2021      Chemistry      Component Value Date/Time   NA 135 07/15/2021 0947   NA 144 08/03/2019 1527   K 4.0 07/15/2021 0947   CL 97 (L) 07/15/2021 0947   CO2 27 07/15/2021 0947   BUN 12 07/15/2021 0947   BUN 9 08/03/2019 1527   CREATININE 0.65 07/15/2021 0947      Component Value Date/Time   CALCIUM 10.0 07/15/2021 0947   ALKPHOS 65 07/15/2021 0947   AST 17 07/15/2021 0947   ALT 16 07/15/2021 0947   BILITOT  0.3 07/15/2021 0947       RADIOGRAPHIC STUDIES:  MR BRAIN W WO CONTRAST  Result Date: 06/30/2021 CLINICAL DATA:  History of lung cancer and brain metastases. EXAM: MRI HEAD WITHOUT AND WITH CONTRAST TECHNIQUE: Multiplanar, multiecho pulse sequences of the brain and surrounding structures were obtained without and with intravenous contrast. CONTRAST:  41mL GADAVIST GADOBUTROL 1 MMOL/ML IV SOLN COMPARISON:  04/01/2021 FINDINGS: BRAIN New Lesions: None. Larger lesions: None. Stable or Smaller lesions: Significantly subsided enhancing lesion at the left occipital parietal sulcus measuring 7 mm today as compared to nearly 2 cm previously. At the left parietal resection site linear superficial enhancement remains postoperative appearing. Other Brain findings: Encephalomalacia in the anterior right temporal lobe in the location of exposure for right-sided  aneurysm clipping. Chronic lacunar infarct at the right caudate head. Cerebral volume loss which is generalized. Significantly subsided vasogenic edema in the left hemisphere. Vascular: Normal flow voids and vascular enhancements. Skull and upper cervical spine: No evidence of metastasis. Unremarkable craniotomies. Sinuses/Orbits: Negative IMPRESSION: 1. Regressed enhancement and edema at the left occipital parietal lesion. 2. Stable left frontal parietal resection site with enhancement that is postoperative appearing. 3. No new lesion. Electronically Signed   By: Jorje Guild M.D.   On: 06/30/2021 05:46   MR CERVICAL SPINE W WO CONTRAST  Result Date: 07/04/2021 CLINICAL DATA:  Severe right shoulder and neck pain, evaluate for metastatic disease. EXAM: MRI CERVICAL SPINE WITHOUT AND WITH CONTRAST TECHNIQUE: Multiplanar and multiecho pulse sequences of the cervical spine, to include the craniocervical junction and cervicothoracic junction, were obtained without and with intravenous contrast. CONTRAST:  11mL GADAVIST GADOBUTROL 1 MMOL/ML IV SOLN COMPARISON:   Brain MRI from 6 days ago FINDINGS: Alignment: Physiologic Vertebrae: Marrow replacing lesion in the C1 right lateral mass which is new from April 01, 2021 brain MRI. There is growth through the ventral cortex into the prevertebral soft tissue. The foramen magnum is not covered on axial slices but was seen on preceding brain MRI and there is no spinal canal/epidural infiltration.If radiotherapy is planned, note that axial slices do not continue through this level. Accentuated fatty marrow conversion in the upper thoracic spine, usually radiation related. Cord: Normal signal and morphology. Posterior Fossa, vertebral arteries, paraspinal tissues: Brain reported separately. Disc levels: C2-3: Disc space narrowing and bulging.  Patent canal and foramina C3-4: Non segmentation. C4-5: Disc space narrowing with endplate and uncovertebral ridging. Biforaminal impingement. Patent spinal canal C5-6: Disc narrowing and bulging with mild facet spurring. Patent canal and foramina C6-7: Disc narrowing with right eccentric uncovertebral ridging and possible protrusion. Right foraminal impingement. C7-T1:Mild facet spurring.  No neural impingement. IMPRESSION: 1. Metastasis in the C1 right lateral mass since November 2022. There is extraosseous growth into the prevertebral space but no epidural infiltration or neural compression. 2. Degenerative foraminal impingement bilaterally at C4-5 and on the right at C6-7. Electronically Signed   By: Jorje Guild M.D.   On: 07/04/2021 11:16     ASSESSMENT/PLAN:  This is a very pleasant 66 year old African-American male diagnosed with metastatic non-small cell lung cancer that was initially diagnosed as a stage IIIa (TX, N2, M0) non-small cell lung cancer, adenocarcinoma.  Initially presented with a large right paratracheal soft tissue mass extending into the right apex in the right supraclavicular and precarinal region.  He was diagnosed in August 2021.  He developed metastatic  disease to the brain in April 2022.  His molecular studies by [employer]ant 360 did not show any actionable mutations.     The patient previously underwent a course of concurrent chemoradiation with carboplatin for an 8 AUC of 2, paclitaxel 45 mg per metered squared.  He is status post 7 cycles.  He had a partial response to treatment.  Last dose of treatment was given on February 28, 2020.  The patient then was undergoing consolidative immunotherapy with Imfinzi 1500 mg IV every 4 weeks.  He was status post 5 cycles but was found to have metastatic disease to the brain and is s/p stereotactic left frontoparietal craniotomy for resection of tumor on 08/30/2020.  The patient then was started on systemic chemotherapy with carboplatin for an AUC of 5, Alimta 500 mg per metered squared, Keytruda 200 mg IV every 3 weeks.  He is  status post 9 cycles .  Starting from cycle #5, the patient started maintenance Alimta and Keytruda every 3 weeks.  His last dose of treatment was in the middle of October 2022.  The patient has been off treatment from October 2022 until now due to his encephalopathy and seizures.  He underwent rehab.   Dr. Julien Nordmann saw the patient on 06/17/2021.  The patient had a restaging CT scan at that time.  Dr. Julien Nordmann personally and independently reviewed the scan discussed results with the patient today.  Unfortunately, the patient scan showed evidence of disease progression with significant enlargement of the pulmonary nodules in the left lower lobe and left upper lobe.  Therefore, Dr. Julien Nordmann changed the patient's treatment to second line chemotherapy with docetaxel 65 mg per metered squared and Cyramza 10 mg/kg IV every 3 weeks with Neulasta support.  The patient is status post 1 cycle and tolerated it fairly well.   Labs were reviewed.  Recommend that he proceed cycle #2 today scheduled.  We will see him back for follow-up visit in 3 weeks for evaluation before starting cycle #3.  For his  history of seizure and metastatic disease to the brain, he will continue on his current treatment with Keppra.  The patient is currently undergoing palliative radiation under the care of Dr. Tammi Klippel to the osseous lesion on C1. His last day of radiation is expected for 07/22/21.   He is scheduled to see palliative care today. He will continue to take ensure BID. Encouraged the patient to have good oral hygiene and to brush his tongue to prevent overgrowth. Advised to use salt water rinses.   The patient was advised to call immediately if he has any concerning symptoms in the interval. The patient voices understanding of current disease status and treatment options and is in agreement with the current care plan. All questions were answered. The patient knows to call the clinic with any problems, questions or concerns. We can certainly see the patient much sooner if necessary            Orders Placed This Encounter  Procedures   Total Protein, Urine dipstick    Standing Status:   Standing    Number of Occurrences:   10    Standing Expiration Date:   07/15/2022     The total time spent in the appointment was 20-29 minutes.   Gina Leblond L Murl Golladay, PA-C 07/15/21

## 2021-07-12 ENCOUNTER — Ambulatory Visit
Admission: RE | Admit: 2021-07-12 | Discharge: 2021-07-12 | Disposition: A | Discharge status: Home / Self Care | Payer: Medicaid Other | Source: Ambulatory Visit | Attending: Radiation Oncology | Admitting: Radiation Oncology

## 2021-07-12 ENCOUNTER — Other Ambulatory Visit: Payer: Self-pay | Admitting: Radiation Oncology

## 2021-07-12 ENCOUNTER — Inpatient Hospital Stay: Payer: Medicaid Other

## 2021-07-12 ENCOUNTER — Other Ambulatory Visit: Payer: Self-pay

## 2021-07-12 DIAGNOSIS — C7951 Secondary malignant neoplasm of bone: Secondary | ICD-10-CM | POA: Diagnosis not present

## 2021-07-12 MED FILL — Dexamethasone Sodium Phosphate Inj 100 MG/10ML: INTRAMUSCULAR | Qty: 1 | Status: AC

## 2021-07-15 ENCOUNTER — Ambulatory Visit
Admission: RE | Admit: 2021-07-15 | Discharge: 2021-07-15 | Disposition: A | Discharge status: Home / Self Care | Payer: Medicaid Other | Source: Ambulatory Visit | Attending: Radiation Oncology | Admitting: Radiation Oncology

## 2021-07-15 ENCOUNTER — Inpatient Hospital Stay: Payer: Medicaid Other

## 2021-07-15 ENCOUNTER — Other Ambulatory Visit: Payer: Self-pay

## 2021-07-15 ENCOUNTER — Inpatient Hospital Stay (HOSPITAL_BASED_OUTPATIENT_CLINIC_OR_DEPARTMENT_OTHER): Payer: Medicaid Other | Admitting: Physician Assistant

## 2021-07-15 ENCOUNTER — Inpatient Hospital Stay (HOSPITAL_BASED_OUTPATIENT_CLINIC_OR_DEPARTMENT_OTHER): Payer: Medicaid Other | Admitting: Nurse Practitioner

## 2021-07-15 VITALS — BP 116/70 | HR 98 | Temp 98.7°F | Resp 17 | Wt 115.1 lb

## 2021-07-15 DIAGNOSIS — C7931 Secondary malignant neoplasm of brain: Secondary | ICD-10-CM

## 2021-07-15 DIAGNOSIS — Z515 Encounter for palliative care: Secondary | ICD-10-CM

## 2021-07-15 DIAGNOSIS — C3411 Malignant neoplasm of upper lobe, right bronchus or lung: Secondary | ICD-10-CM

## 2021-07-15 DIAGNOSIS — C7951 Secondary malignant neoplasm of bone: Secondary | ICD-10-CM | POA: Diagnosis not present

## 2021-07-15 DIAGNOSIS — R5383 Other fatigue: Secondary | ICD-10-CM | POA: Diagnosis not present

## 2021-07-15 DIAGNOSIS — Z95828 Presence of other vascular implants and grafts: Secondary | ICD-10-CM

## 2021-07-15 DIAGNOSIS — R630 Anorexia: Secondary | ICD-10-CM | POA: Diagnosis not present

## 2021-07-15 DIAGNOSIS — G4700 Insomnia, unspecified: Secondary | ICD-10-CM

## 2021-07-15 DIAGNOSIS — Z5111 Encounter for antineoplastic chemotherapy: Secondary | ICD-10-CM

## 2021-07-15 LAB — CBC WITH DIFFERENTIAL (CANCER CENTER ONLY)
Abs Immature Granulocytes: 0.39 10*3/uL — ABNORMAL HIGH (ref 0.00–0.07)
Basophils Absolute: 0 10*3/uL (ref 0.0–0.1)
Basophils Relative: 0 %
Eosinophils Absolute: 0 10*3/uL (ref 0.0–0.5)
Eosinophils Relative: 0 %
HCT: 33 % — ABNORMAL LOW (ref 39.0–52.0)
Hemoglobin: 10.6 g/dL — ABNORMAL LOW (ref 13.0–17.0)
Immature Granulocytes: 2 %
Lymphocytes Relative: 2 %
Lymphs Abs: 0.4 10*3/uL — ABNORMAL LOW (ref 0.7–4.0)
MCH: 24.5 pg — ABNORMAL LOW (ref 26.0–34.0)
MCHC: 32.1 g/dL (ref 30.0–36.0)
MCV: 76.2 fL — ABNORMAL LOW (ref 80.0–100.0)
Monocytes Absolute: 0.5 10*3/uL (ref 0.1–1.0)
Monocytes Relative: 3 %
Neutro Abs: 14.8 10*3/uL — ABNORMAL HIGH (ref 1.7–7.7)
Neutrophils Relative %: 93 %
Platelet Count: 341 10*3/uL (ref 150–400)
RBC: 4.33 MIL/uL (ref 4.22–5.81)
RDW: 15.4 % (ref 11.5–15.5)
WBC Count: 16.2 10*3/uL — ABNORMAL HIGH (ref 4.0–10.5)
nRBC: 0 % (ref 0.0–0.2)

## 2021-07-15 LAB — CMP (CANCER CENTER ONLY)
ALT: 16 U/L (ref 0–44)
AST: 17 U/L (ref 15–41)
Albumin: 3.7 g/dL (ref 3.5–5.0)
Alkaline Phosphatase: 65 U/L (ref 38–126)
Anion gap: 11 (ref 5–15)
BUN: 12 mg/dL (ref 8–23)
CO2: 27 mmol/L (ref 22–32)
Calcium: 10 mg/dL (ref 8.9–10.3)
Chloride: 97 mmol/L — ABNORMAL LOW (ref 98–111)
Creatinine: 0.65 mg/dL (ref 0.61–1.24)
GFR, Estimated: >60 mL/min (ref >60)
Glucose, Bld: 98 mg/dL (ref 70–99)
Potassium: 4 mmol/L (ref 3.5–5.1)
Sodium: 135 mmol/L (ref 135–145)
Total Bilirubin: 0.3 mg/dL (ref 0.3–1.2)
Total Protein: 7.3 g/dL (ref 6.5–8.1)

## 2021-07-15 LAB — TOTAL PROTEIN, URINE DIPSTICK: Protein, ur: <30 mg/dL — AB

## 2021-07-15 MED ORDER — SODIUM CHLORIDE 0.9 % IV SOLN
65.0000 mg/m2 | Freq: Once | INTRAVENOUS | Status: AC
  Administered 2021-07-15: 100 mg via INTRAVENOUS
  Filled 2021-07-15: qty 10

## 2021-07-15 MED ORDER — DIPHENHYDRAMINE HCL 50 MG/ML IJ SOLN
50.0000 mg | Freq: Once | INTRAMUSCULAR | Status: AC
  Administered 2021-07-15: 50 mg via INTRAVENOUS
  Filled 2021-07-15: qty 1

## 2021-07-15 MED ORDER — ACETAMINOPHEN 325 MG PO TABS
650.0000 mg | ORAL_TABLET | Freq: Once | ORAL | Status: AC
  Administered 2021-07-15: 650 mg via ORAL
  Filled 2021-07-15: qty 2

## 2021-07-15 MED ORDER — SODIUM CHLORIDE 0.9% FLUSH
10.0000 mL | INTRAVENOUS | Status: DC | PRN
  Administered 2021-07-15: 10 mL

## 2021-07-15 MED ORDER — SODIUM CHLORIDE 0.9% FLUSH
10.0000 mL | INTRAVENOUS | Status: AC | PRN
  Administered 2021-07-15: 10 mL

## 2021-07-15 MED ORDER — SODIUM CHLORIDE 0.9 % IV SOLN: Freq: Once | INTRAVENOUS | Status: AC

## 2021-07-15 MED ORDER — SODIUM CHLORIDE 0.9 % IV SOLN
10.0000 mg/kg | Freq: Once | INTRAVENOUS | Status: AC
  Administered 2021-07-15: 500 mg via INTRAVENOUS
  Filled 2021-07-15: qty 50

## 2021-07-15 MED ORDER — HEPARIN SOD (PORK) LOCK FLUSH 100 UNIT/ML IV SOLN
500.0000 [IU] | Freq: Once | INTRAVENOUS | Status: AC | PRN
  Administered 2021-07-15: 500 [IU]

## 2021-07-15 MED ORDER — SODIUM CHLORIDE 0.9 % IV SOLN
10.0000 mg | Freq: Once | INTRAVENOUS | Status: AC
  Administered 2021-07-15: 10 mg via INTRAVENOUS
  Filled 2021-07-15: qty 10

## 2021-07-15 NOTE — Patient Instructions (Signed)
[facility]  Discharge Instructions: Thank you for choosing [facility] to provide your oncology and hematology care.   If you have a lab appointment with the [facility], please go directly to the [facility] and check in at the registration area.   Wear comfortable clothing and clothing appropriate for easy access to any Portacath or PICC line.   We strive to give you quality time with your provider. You may need to reschedule your appointment if you arrive late (15 or more minutes).  Arriving late affects you and other patients whose appointments are after yours.  Also, if you miss three or more appointments without notifying the office, you may be dismissed from the clinic at the providers discretion.      For prescription refill requests, have your pharmacy contact our office and allow 72 hours for refills to be completed.    Today you received the following chemotherapy and/or immunotherapy agents: Cyramza & Docetaxel      To help prevent nausea and vomiting after your treatment, we encourage you to take your nausea medication as directed.  BELOW ARE SYMPTOMS THAT SHOULD BE REPORTED IMMEDIATELY: *FEVER GREATER THAN 100.4 F (38 C) OR HIGHER *CHILLS OR SWEATING *NAUSEA AND VOMITING THAT IS NOT CONTROLLED WITH YOUR NAUSEA MEDICATION *UNUSUAL SHORTNESS OF BREATH *UNUSUAL BRUISING OR BLEEDING *URINARY PROBLEMS (pain or burning when urinating, or frequent urination) *BOWEL PROBLEMS (unusual diarrhea, constipation, pain near the anus) TENDERNESS IN MOUTH AND THROAT WITH OR WITHOUT PRESENCE OF ULCERS (sore throat, sores in mouth, or a toothache) UNUSUAL RASH, SWELLING OR PAIN  UNUSUAL VAGINAL DISCHARGE OR ITCHING   Items with * indicate a potential emergency and should be followed up as soon as possible or go to the Emergency Department if any problems should occur.  Please show the CHEMOTHERAPY ALERT CARD or IMMUNOTHERAPY ALERT CARD at  check-in to the Emergency Department and triage nurse.  Should you have questions after your visit or need to cancel or reschedule your appointment, please contact [facility]  Dept: [phone]  and follow the prompts.  Office hours are 8:00 a.m. to 4:30 p.m. Monday - Friday. Please note that voicemails left after 4:00 p.m. may not be returned until the following business day.  We are closed weekends and major holidays. You have access to a nurse at all times for urgent questions. Please call the main number to the clinic Dept: [phone] and follow the prompts.   For any non-urgent questions, you may also contact your provider using MyChart. We now offer e-Visits for anyone 65 and older to request care online for non-urgent symptoms. For details visit [email or web address].   Also download the MyChart app! Go to the app store, search "MyChart", open the app, select [facility], and log in with your MyChart username and password.  Due to Covid, a mask is required upon entering the hospital/clinic. If you do not have a mask, one will be given to you upon arrival. For doctor visits, patients may have 1 support person aged 65 or older with them. For treatment visits, patients cannot have anyone with them due to current Covid guidelines and our immunocompromised population.

## 2021-07-16 ENCOUNTER — Encounter: Payer: Self-pay | Admitting: Nurse Practitioner

## 2021-07-16 ENCOUNTER — Inpatient Hospital Stay: Payer: Medicaid Other

## 2021-07-16 ENCOUNTER — Other Ambulatory Visit: Payer: Self-pay

## 2021-07-16 ENCOUNTER — Encounter: Payer: Self-pay | Admitting: Internal Medicine

## 2021-07-16 ENCOUNTER — Encounter: Payer: Self-pay | Admitting: Physician Assistant

## 2021-07-16 ENCOUNTER — Telehealth: Payer: Self-pay | Admitting: Nurse Practitioner

## 2021-07-16 ENCOUNTER — Ambulatory Visit
Admission: RE | Admit: 2021-07-16 | Discharge: 2021-07-16 | Disposition: A | Discharge status: Home / Self Care | Payer: Medicaid Other | Source: Ambulatory Visit | Attending: Radiation Oncology | Admitting: Radiation Oncology

## 2021-07-16 ENCOUNTER — Ambulatory Visit: Payer: Medicaid Other | Admitting: Physical Therapy

## 2021-07-16 ENCOUNTER — Other Ambulatory Visit: Payer: Self-pay | Admitting: Radiation Therapy

## 2021-07-16 ENCOUNTER — Ambulatory Visit: Payer: Medicaid Other | Admitting: Speech Pathology

## 2021-07-16 DIAGNOSIS — C7951 Secondary malignant neoplasm of bone: Secondary | ICD-10-CM | POA: Diagnosis not present

## 2021-07-16 MED ORDER — MIRTAZAPINE 7.5 MG PO TABS
7.5000 mg | ORAL_TABLET | Freq: Every day | ORAL | 0 refills | Status: DC
  Filled 2021-07-16: qty 30, 30d supply, fill #0

## 2021-07-16 NOTE — Telephone Encounter (Signed)
Scheduled per 2/27 los, pt friend has been called and confirmed

## 2021-07-16 NOTE — Progress Notes (Signed)
[facility]  [phone] [phone]) [phone]   Name: [name] [name] Date: 07/16/2021 MRN: [id number]  DOB: [birth year]  Patient Care Team: [name], NP as PCP - General (Internal Medicine) Curt Bears, MD as Consulting Physician (Oncology) Brunetta Genera, MD as Consulting Physician (Hematology) Maryanna Shape, NP as Nurse Practitioner (Oncology) Pickenpack-Cousar, Carlena Sax, NP as Nurse Practitioner (Nurse Practitioner)    INTERVAL HISTORY: [name] [name] is a 66 y.o. male with medical history of COPD, hypertension, and metastatic NSCLC s/p chemotherapy/immunotherapy, craniotomy (08/2020) w/p SRS to brain.  Palliative ask to follow for ongoing goals of care support.  SOCIAL HISTORY:     reports that he has quit smoking. His smoking use included cigarettes. He smoked an average of .5 packs per day. He has never used smokeless tobacco. He reports that he does not currently use alcohol. He reports that he does not use drugs.  ADVANCE DIRECTIVES:  On file. [name] [name] is documented Health Care Power of Attorney  CODE STATUS: Full code  PAST MEDICAL HISTORY: Past Medical History:  Diagnosis Date   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[facility]) 12/25/2019   Cancer ([place])    COPD (chronic obstructive pulmonary disease) ([facility]) 11/02/2007   Qualifier: Diagnosis of  By: Melvyn Novas MD, Christena Deem    Hypertension 12/24/2019   Iron deficiency anemia 08/23/2007   Qualifier: Diagnosis of  By: Jim Like      ALLERGIES:  is allergic to other.  MEDICATIONS:  Current Outpatient Medications  Medication Sig Dispense Refill   mirtazapine (REMERON) 7.5 MG tablet Take 1 tablet (7.5 mg total) by mouth at bedtime. 30 tablet 0   acetaminophen (TYLENOL) 500 MG tablet Take 500 mg by mouth every 6 (six) hours as needed for mild pain or moderate pain.     budesonide-formoterol (SYMBICORT) 80-4.5 MCG/ACT inhaler Inhale 2 puffs into the lungs daily. 10.2 g  0   dexamethasone (DECADRON) 4 MG tablet Take 2 tablets by mouth twice daily the day before day of and day after the chemotherapy every 3 weeks. (Patient not taking: Reported on 07/08/2021) 40 tablet 1   feeding supplement (ENSURE ENLIVE / ENSURE PLUS) LIQD Take 237 mLs by mouth 2 (two) times daily between meals. 222 mL 12   folic acid (FOLVITE) 1 MG tablet Take 1 tablet (1 mg total) by mouth daily. 30 tablet 0   ipratropium-albuterol (DUONEB) 0.5-2.5 (3) MG/3ML SOLN Take 3 mLs by nebulization every 4 (four) hours as needed. 360 mL    levETIRAcetam (KEPPRA) 1000 MG tablet Take 1 tablet (1,000 mg total) by mouth 2 (two) times daily. 60 tablet 2   lidocaine-prilocaine (EMLA) cream Apply 1 application topically as needed. 30 g 1   Multiple Vitamin (MULTIVITAMIN) tablet Take 1 tablet by mouth daily.     nicotine polacrilex (COMMIT) 2 MG lozenge Take 2 mg by mouth as needed.     pantoprazole (PROTONIX) 40 MG tablet Take 1 tablet (40 mg total) by mouth daily. 30 tablet 0   polyethylene glycol (MIRALAX / GLYCOLAX) 17 g packet Take 17 g by mouth daily. 14 each 0   prochlorperazine (COMPAZINE) 10 MG tablet Take 1 tablet (10 mg total) by mouth every 6 hours for nausea (Patient not taking: Reported on 07/08/2021) 30 tablet 0   senna (SENOKOT) 8.6 MG TABS tablet Take 1 tablet (8.6 mg total) by mouth 2 (two) times daily. 120 tablet 0   tiotropium (SPIRIVA HANDIHALER) 18 MCG inhalation capsule Place 1 capsule  into inhaler and inhale daily. 30 capsule 1   traMADol (ULTRAM) 50 MG tablet Take 1 tablet (50 mg total) by mouth 2 (two) times daily as needed for moderate pain. 30 tablet 0   umeclidinium bromide (INCRUSE ELLIPTA) 62.5 MCG/ACT AEPB Inhale 1 puff into the lungs daily. (Patient not taking: Reported on 07/08/2021) 30 each 0   VENTOLIN HFA 108 (90 Base) MCG/ACT inhaler Inhale 2 puffs into the lungs every 4 (four) hours as needed for wheezing or shortness of breath. (Patient not taking: Reported on 07/08/2021)      No current facility-administered medications for this visit.    VITAL SIGNS: There were no vitals taken for this visit. There were no vitals filed for this visit.  Estimated body mass index is 22.47 kg/m as calculated from the following:   Height as of 07/01/21: 5' (1.524 m).   Weight as of an earlier encounter on 07/15/21: 115 lb 1 oz (52.2 kg).   PERFORMANCE STATUS (ECOG) : 1 - Symptomatic but completely ambulatory  Physical Exam General: NAD, ambulatory with cane Cardio: RRR Resp: clear bilaterally Neurological: AAOx3  IMPRESSION:  I saw [name] [name] during infusion today. Continues to use transportation for his appointments. States he is doing well at home at [employer] on him daily and runs errands such as grocery shopping etc.   [name] [name] reports he is feeling ok but is concerned with his decrease appetite and states he has been sleeping poorly over the past month.   Decreased appetite [name] [name] states his appetite continues to decrease. On some days he has minimal desire to eat. Taste bud changes. States he may take a few bites and loses the desire to eat. Discussed protein shakes such as Ensure, Boost, or Premier protein. Education provided on trying small frequent meals, snacking when hungry, eating foods that he enjoys. He verbalized understanding.   We discussed use of appetite stimulant. He understands medication could also help with his insomnia.   Current weight 115 lbs down from 123 lbs on 2/20.   Insomnia  He is not sleeping well at night. States he may only sleep 4-5 hours which is interrupted or includes periods when he is awake and trying to fall back to sleep.   Denies nausea, vomiting, or shortness of breath. Endorses some fatigue and pain in lower extremities which sometimes makes it difficult for him to sleep at night.    PLAN: Tylenol as needed for pain/discomfort.  Tramadol 50 mg twice daily as needed for generalized pain.  Future appointments  reviewed. Remeron 7.5mg  at bedtime Education provided on nutrition and increased protein intake.  I will plan to see patient back in 2-3 weeks for ongoing support.   Patient expressed understanding and was in agreement with this plan. He also understands that He can call the clinic at any time with any questions, concerns, or complaints.   Time Total: 30 min.   Visit consisted of counseling and education dealing with the complex and emotionally intense issues of symptom management and palliative care in the setting of serious and potentially life-threatening illness.Greater than 50%  of this time was spent counseling and coordinating care related to the above assessment and plan.  [name] [name], AGPCNP-BC  Palliative Medicine Team/[facility]

## 2021-07-17 ENCOUNTER — Inpatient Hospital Stay: Payer: Medicaid Other | Attending: Internal Medicine

## 2021-07-17 ENCOUNTER — Inpatient Hospital Stay: Payer: Medicaid Other

## 2021-07-17 ENCOUNTER — Other Ambulatory Visit: Payer: Self-pay

## 2021-07-17 ENCOUNTER — Ambulatory Visit
Admission: RE | Admit: 2021-07-17 | Discharge: 2021-07-17 | Disposition: A | Discharge status: Home / Self Care | Payer: Medicaid Other | Source: Ambulatory Visit | Attending: Urology | Admitting: Urology

## 2021-07-17 VITALS — BP 102/67 | HR 98 | Temp 98.3°F | Resp 16

## 2021-07-17 DIAGNOSIS — Z452 Encounter for adjustment and management of vascular access device: Secondary | ICD-10-CM | POA: Insufficient documentation

## 2021-07-17 DIAGNOSIS — C3411 Malignant neoplasm of upper lobe, right bronchus or lung: Secondary | ICD-10-CM | POA: Diagnosis present

## 2021-07-17 DIAGNOSIS — Z5111 Encounter for antineoplastic chemotherapy: Secondary | ICD-10-CM | POA: Insufficient documentation

## 2021-07-17 DIAGNOSIS — Z5112 Encounter for antineoplastic immunotherapy: Secondary | ICD-10-CM | POA: Insufficient documentation

## 2021-07-17 DIAGNOSIS — Z510 Encounter for antineoplastic radiation therapy: Secondary | ICD-10-CM | POA: Diagnosis present

## 2021-07-17 DIAGNOSIS — Z5189 Encounter for other specified aftercare: Secondary | ICD-10-CM | POA: Insufficient documentation

## 2021-07-17 DIAGNOSIS — C7951 Secondary malignant neoplasm of bone: Secondary | ICD-10-CM | POA: Insufficient documentation

## 2021-07-17 DIAGNOSIS — C7931 Secondary malignant neoplasm of brain: Secondary | ICD-10-CM | POA: Insufficient documentation

## 2021-07-17 MED ORDER — PEGFILGRASTIM-CBQV 6 MG/0.6ML ~~LOC~~ SOSY
6.0000 mg | PREFILLED_SYRINGE | Freq: Once | SUBCUTANEOUS | Status: AC
  Administered 2021-07-17: 6 mg via SUBCUTANEOUS
  Filled 2021-07-17: qty 0.6

## 2021-07-18 ENCOUNTER — Inpatient Hospital Stay: Payer: Medicaid Other

## 2021-07-18 ENCOUNTER — Ambulatory Visit
Admission: RE | Admit: 2021-07-18 | Discharge: 2021-07-18 | Disposition: A | Discharge status: Home / Self Care | Payer: Medicaid Other | Source: Ambulatory Visit | Attending: Radiation Oncology | Admitting: Radiation Oncology

## 2021-07-18 DIAGNOSIS — Z510 Encounter for antineoplastic radiation therapy: Secondary | ICD-10-CM | POA: Diagnosis not present

## 2021-07-19 ENCOUNTER — Inpatient Hospital Stay: Payer: Medicaid Other

## 2021-07-19 ENCOUNTER — Ambulatory Visit
Admission: RE | Admit: 2021-07-19 | Discharge: 2021-07-19 | Disposition: A | Discharge status: Home / Self Care | Payer: Medicaid Other | Source: Ambulatory Visit | Attending: Radiation Oncology | Admitting: Radiation Oncology

## 2021-07-19 DIAGNOSIS — Z510 Encounter for antineoplastic radiation therapy: Secondary | ICD-10-CM | POA: Diagnosis not present

## 2021-07-22 ENCOUNTER — Other Ambulatory Visit: Payer: Self-pay

## 2021-07-22 ENCOUNTER — Encounter: Payer: Self-pay | Admitting: Urology

## 2021-07-22 ENCOUNTER — Inpatient Hospital Stay: Payer: Medicaid Other

## 2021-07-22 ENCOUNTER — Ambulatory Visit
Admission: RE | Admit: 2021-07-22 | Discharge: 2021-07-22 | Disposition: A | Discharge status: Home / Self Care | Payer: Medicaid Other | Source: Ambulatory Visit | Attending: Radiation Oncology | Admitting: Radiation Oncology

## 2021-07-22 ENCOUNTER — Telehealth: Payer: Self-pay | Admitting: Internal Medicine

## 2021-07-22 DIAGNOSIS — C7951 Secondary malignant neoplasm of bone: Secondary | ICD-10-CM

## 2021-07-22 DIAGNOSIS — Z510 Encounter for antineoplastic radiation therapy: Secondary | ICD-10-CM | POA: Diagnosis not present

## 2021-07-22 NOTE — Telephone Encounter (Signed)
Scheduled per 02/27 los, spoke with patient's friend regarding patient's upcoming appointments. Patient will be notified. ?

## 2021-07-23 ENCOUNTER — Inpatient Hospital Stay: Payer: Medicaid Other

## 2021-07-23 ENCOUNTER — Ambulatory Visit: Payer: Medicaid Other | Admitting: Speech Pathology

## 2021-07-23 ENCOUNTER — Ambulatory Visit: Payer: Medicaid Other | Admitting: Physical Therapy

## 2021-07-23 ENCOUNTER — Other Ambulatory Visit: Payer: Self-pay

## 2021-07-23 DIAGNOSIS — Z5111 Encounter for antineoplastic chemotherapy: Secondary | ICD-10-CM | POA: Insufficient documentation

## 2021-07-23 DIAGNOSIS — C3411 Malignant neoplasm of upper lobe, right bronchus or lung: Secondary | ICD-10-CM | POA: Diagnosis present

## 2021-07-23 DIAGNOSIS — Z5189 Encounter for other specified aftercare: Secondary | ICD-10-CM | POA: Diagnosis not present

## 2021-07-23 DIAGNOSIS — Z5112 Encounter for antineoplastic immunotherapy: Secondary | ICD-10-CM | POA: Diagnosis present

## 2021-07-23 DIAGNOSIS — C7931 Secondary malignant neoplasm of brain: Secondary | ICD-10-CM | POA: Insufficient documentation

## 2021-07-23 DIAGNOSIS — Z452 Encounter for adjustment and management of vascular access device: Secondary | ICD-10-CM | POA: Insufficient documentation

## 2021-07-23 DIAGNOSIS — Z95828 Presence of other vascular implants and grafts: Secondary | ICD-10-CM

## 2021-07-23 LAB — CBC WITH DIFFERENTIAL (CANCER CENTER ONLY)
Abs Immature Granulocytes: 2.75 10*3/uL — ABNORMAL HIGH (ref 0.00–0.07)
Basophils Absolute: 0.2 10*3/uL — ABNORMAL HIGH (ref 0.0–0.1)
Basophils Relative: 1 %
Eosinophils Absolute: 0.1 10*3/uL (ref 0.0–0.5)
Eosinophils Relative: 0 %
HCT: 33.6 % — ABNORMAL LOW (ref 39.0–52.0)
Hemoglobin: 10.8 g/dL — ABNORMAL LOW (ref 13.0–17.0)
Immature Granulocytes: 15 %
Lymphocytes Relative: 5 %
Lymphs Abs: 0.9 10*3/uL (ref 0.7–4.0)
MCH: 24.3 pg — ABNORMAL LOW (ref 26.0–34.0)
MCHC: 32.1 g/dL (ref 30.0–36.0)
MCV: 75.5 fL — ABNORMAL LOW (ref 80.0–100.0)
Monocytes Absolute: 1.8 10*3/uL — ABNORMAL HIGH (ref 0.1–1.0)
Monocytes Relative: 10 %
Neutro Abs: 12.1 10*3/uL — ABNORMAL HIGH (ref 1.7–7.7)
Neutrophils Relative %: 69 %
Platelet Count: 273 10*3/uL (ref 150–400)
RBC: 4.45 MIL/uL (ref 4.22–5.81)
RDW: 15.4 % (ref 11.5–15.5)
Smear Review: NORMAL
WBC Count: 17.8 10*3/uL — ABNORMAL HIGH (ref 4.0–10.5)
nRBC: 0.4 % — ABNORMAL HIGH (ref 0.0–0.2)

## 2021-07-23 LAB — CMP (CANCER CENTER ONLY)
ALT: 13 U/L (ref 0–44)
AST: 17 U/L (ref 15–41)
Albumin: 3.7 g/dL (ref 3.5–5.0)
Alkaline Phosphatase: 89 U/L (ref 38–126)
Anion gap: 16 — ABNORMAL HIGH (ref 5–15)
BUN: 9 mg/dL (ref 8–23)
CO2: 24 mmol/L (ref 22–32)
Calcium: 9.8 mg/dL (ref 8.9–10.3)
Chloride: 95 mmol/L — ABNORMAL LOW (ref 98–111)
Creatinine: 0.62 mg/dL (ref 0.61–1.24)
GFR, Estimated: >60 mL/min (ref >60)
Glucose, Bld: 72 mg/dL (ref 70–99)
Potassium: 3.2 mmol/L — ABNORMAL LOW (ref 3.5–5.1)
Sodium: 135 mmol/L (ref 135–145)
Total Bilirubin: 0.3 mg/dL (ref 0.3–1.2)
Total Protein: 6.9 g/dL (ref 6.5–8.1)

## 2021-07-23 MED ORDER — HEPARIN SOD (PORK) LOCK FLUSH 100 UNIT/ML IV SOLN
500.0000 [IU] | INTRAVENOUS | Status: AC | PRN
  Administered 2021-07-23: 500 [IU]

## 2021-07-23 MED ORDER — SODIUM CHLORIDE 0.9% FLUSH
10.0000 mL | INTRAVENOUS | Status: AC | PRN
  Administered 2021-07-23: 10 mL

## 2021-07-28 ENCOUNTER — Other Ambulatory Visit (INDEPENDENT_AMBULATORY_CARE_PROVIDER_SITE_OTHER): Payer: Self-pay

## 2021-07-29 ENCOUNTER — Other Ambulatory Visit: Payer: Self-pay

## 2021-07-29 ENCOUNTER — Telehealth: Payer: Self-pay | Admitting: *Deleted

## 2021-07-29 NOTE — Telephone Encounter (Signed)
Family called for a refill of Folic Acid for [name] [name].  ?

## 2021-07-30 ENCOUNTER — Inpatient Hospital Stay: Payer: Medicaid Other

## 2021-07-30 ENCOUNTER — Other Ambulatory Visit: Payer: Self-pay

## 2021-07-30 DIAGNOSIS — C3411 Malignant neoplasm of upper lobe, right bronchus or lung: Secondary | ICD-10-CM

## 2021-07-30 DIAGNOSIS — Z5112 Encounter for antineoplastic immunotherapy: Secondary | ICD-10-CM | POA: Diagnosis not present

## 2021-07-30 DIAGNOSIS — C7931 Secondary malignant neoplasm of brain: Secondary | ICD-10-CM

## 2021-07-30 LAB — CBC WITH DIFFERENTIAL (CANCER CENTER ONLY)
Abs Immature Granulocytes: 1.16 10*3/uL — ABNORMAL HIGH (ref 0.00–0.07)
Basophils Absolute: 0.2 10*3/uL — ABNORMAL HIGH (ref 0.0–0.1)
Basophils Relative: 1 %
Eosinophils Absolute: 0.1 10*3/uL (ref 0.0–0.5)
Eosinophils Relative: 0 %
HCT: 35.8 % — ABNORMAL LOW (ref 39.0–52.0)
Hemoglobin: 11.5 g/dL — ABNORMAL LOW (ref 13.0–17.0)
Immature Granulocytes: 5 %
Lymphocytes Relative: 6 %
Lymphs Abs: 1.4 10*3/uL (ref 0.7–4.0)
MCH: 24.4 pg — ABNORMAL LOW (ref 26.0–34.0)
MCHC: 32.1 g/dL (ref 30.0–36.0)
MCV: 75.8 fL — ABNORMAL LOW (ref 80.0–100.0)
Monocytes Absolute: 1.5 10*3/uL — ABNORMAL HIGH (ref 0.1–1.0)
Monocytes Relative: 7 %
Neutro Abs: 19.2 10*3/uL — ABNORMAL HIGH (ref 1.7–7.7)
Neutrophils Relative %: 81 %
Platelet Count: 257 10*3/uL (ref 150–400)
RBC: 4.72 MIL/uL (ref 4.22–5.81)
RDW: 16.6 % — ABNORMAL HIGH (ref 11.5–15.5)
WBC Count: 23.5 10*3/uL — ABNORMAL HIGH (ref 4.0–10.5)
nRBC: 0.1 % (ref 0.0–0.2)

## 2021-07-30 LAB — CMP (CANCER CENTER ONLY)
ALT: 9 U/L (ref 0–44)
AST: 16 U/L (ref 15–41)
Albumin: 3.6 g/dL (ref 3.5–5.0)
Alkaline Phosphatase: 101 U/L (ref 38–126)
Anion gap: 13 (ref 5–15)
BUN: 11 mg/dL (ref 8–23)
CO2: 27 mmol/L (ref 22–32)
Calcium: 9.7 mg/dL (ref 8.9–10.3)
Chloride: 96 mmol/L — ABNORMAL LOW (ref 98–111)
Creatinine: 0.66 mg/dL (ref 0.61–1.24)
GFR, Estimated: >60 mL/min (ref >60)
Glucose, Bld: 103 mg/dL — ABNORMAL HIGH (ref 70–99)
Potassium: 3.1 mmol/L — ABNORMAL LOW (ref 3.5–5.1)
Sodium: 136 mmol/L (ref 135–145)
Total Bilirubin: 0.3 mg/dL (ref 0.3–1.2)
Total Protein: 6.8 g/dL (ref 6.5–8.1)

## 2021-07-30 MED ORDER — HEPARIN SOD (PORK) LOCK FLUSH 100 UNIT/ML IV SOLN
500.0000 [IU] | INTRAVENOUS | Status: AC | PRN
  Administered 2021-07-30: 500 [IU]

## 2021-07-30 MED ORDER — SODIUM CHLORIDE 0.9% FLUSH
10.0000 mL | INTRAVENOUS | Status: AC | PRN
  Administered 2021-07-30: 10 mL

## 2021-07-31 ENCOUNTER — Telehealth: Payer: Self-pay

## 2021-07-31 ENCOUNTER — Other Ambulatory Visit: Payer: Self-pay

## 2021-07-31 NOTE — Telephone Encounter (Signed)
-----   Message from [place], PA-C sent at 07/31/2021  1:21 PM EDT ----- ?Just wanted to make sure these are being seen ?----- Message ----- ?From: Curt Bears, MD ?Sent: 07/30/2021   4:40 PM EDT ?To: Cassandra L Heilingoetter, PA-C, [facility] ? ?Please encourage the patient to increase his potassium supplements and diet. ?----- Message ----- ?From: Interface, Lab In [facility] ?Sent: 07/30/2021   9:34 AM EDT ?To: Curt Bears, MD ? ? ? ?

## 2021-07-31 NOTE — Telephone Encounter (Signed)
I spoke with pts POA, Peggy, and advised of the results. We reviewed K rich foods in details and she shared she will go purchase him some of the items off the list. ?

## 2021-08-02 ENCOUNTER — Other Ambulatory Visit: Payer: Self-pay

## 2021-08-05 ENCOUNTER — Inpatient Hospital Stay: Payer: Medicaid Other

## 2021-08-05 ENCOUNTER — Encounter: Payer: Self-pay | Admitting: Internal Medicine

## 2021-08-05 ENCOUNTER — Other Ambulatory Visit (INDEPENDENT_AMBULATORY_CARE_PROVIDER_SITE_OTHER): Payer: Self-pay

## 2021-08-05 ENCOUNTER — Telehealth: Payer: Self-pay | Admitting: Medical Oncology

## 2021-08-05 ENCOUNTER — Other Ambulatory Visit: Payer: Self-pay

## 2021-08-05 ENCOUNTER — Inpatient Hospital Stay (HOSPITAL_BASED_OUTPATIENT_CLINIC_OR_DEPARTMENT_OTHER): Payer: Medicaid Other | Admitting: Internal Medicine

## 2021-08-05 VITALS — BP 115/78 | HR 117 | Temp 96.7°F | Resp 18 | Wt 108.2 lb

## 2021-08-05 DIAGNOSIS — C3411 Malignant neoplasm of upper lobe, right bronchus or lung: Secondary | ICD-10-CM

## 2021-08-05 DIAGNOSIS — C7931 Secondary malignant neoplasm of brain: Secondary | ICD-10-CM

## 2021-08-05 DIAGNOSIS — Z5112 Encounter for antineoplastic immunotherapy: Secondary | ICD-10-CM | POA: Diagnosis not present

## 2021-08-05 DIAGNOSIS — Z5111 Encounter for antineoplastic chemotherapy: Secondary | ICD-10-CM | POA: Diagnosis not present

## 2021-08-05 DIAGNOSIS — C3490 Malignant neoplasm of unspecified part of unspecified bronchus or lung: Secondary | ICD-10-CM

## 2021-08-05 DIAGNOSIS — C7951 Secondary malignant neoplasm of bone: Secondary | ICD-10-CM | POA: Diagnosis not present

## 2021-08-05 DIAGNOSIS — Z95828 Presence of other vascular implants and grafts: Secondary | ICD-10-CM

## 2021-08-05 LAB — CMP (CANCER CENTER ONLY)
ALT: 13 U/L (ref 0–44)
AST: 19 U/L (ref 15–41)
Albumin: 3.6 g/dL (ref 3.5–5.0)
Alkaline Phosphatase: 75 U/L (ref 38–126)
Anion gap: 12 (ref 5–15)
BUN: 13 mg/dL (ref 8–23)
CO2: 30 mmol/L (ref 22–32)
Calcium: 9.5 mg/dL (ref 8.9–10.3)
Chloride: 93 mmol/L — ABNORMAL LOW (ref 98–111)
Creatinine: 0.61 mg/dL (ref 0.61–1.24)
GFR, Estimated: >60 mL/min (ref >60)
Glucose, Bld: 119 mg/dL — ABNORMAL HIGH (ref 70–99)
Potassium: 3.5 mmol/L (ref 3.5–5.1)
Sodium: 135 mmol/L (ref 135–145)
Total Bilirubin: 0.4 mg/dL (ref 0.3–1.2)
Total Protein: 7.1 g/dL (ref 6.5–8.1)

## 2021-08-05 LAB — CBC WITH DIFFERENTIAL (CANCER CENTER ONLY)
Abs Immature Granulocytes: 0.98 10*3/uL — ABNORMAL HIGH (ref 0.00–0.07)
Basophils Absolute: 0.1 10*3/uL (ref 0.0–0.1)
Basophils Relative: 1 %
Eosinophils Absolute: 0 10*3/uL (ref 0.0–0.5)
Eosinophils Relative: 0 %
HCT: 31 % — ABNORMAL LOW (ref 39.0–52.0)
Hemoglobin: 10.5 g/dL — ABNORMAL LOW (ref 13.0–17.0)
Immature Granulocytes: 4 %
Lymphocytes Relative: 2 %
Lymphs Abs: 0.6 10*3/uL — ABNORMAL LOW (ref 0.7–4.0)
MCH: 25.1 pg — ABNORMAL LOW (ref 26.0–34.0)
MCHC: 33.9 g/dL (ref 30.0–36.0)
MCV: 74 fL — ABNORMAL LOW (ref 80.0–100.0)
Monocytes Absolute: 0.8 10*3/uL (ref 0.1–1.0)
Monocytes Relative: 3 %
Neutro Abs: 21.1 10*3/uL — ABNORMAL HIGH (ref 1.7–7.7)
Neutrophils Relative %: 90 %
Platelet Count: 323 10*3/uL (ref 150–400)
RBC: 4.19 MIL/uL — ABNORMAL LOW (ref 4.22–5.81)
RDW: 16.5 % — ABNORMAL HIGH (ref 11.5–15.5)
WBC Count: 23.6 10*3/uL — ABNORMAL HIGH (ref 4.0–10.5)
nRBC: 0 % (ref 0.0–0.2)

## 2021-08-05 LAB — TOTAL PROTEIN, URINE DIPSTICK: Protein, ur: 30 mg/dL — AB

## 2021-08-05 MED ORDER — ACETAMINOPHEN 325 MG PO TABS
650.0000 mg | ORAL_TABLET | Freq: Once | ORAL | Status: AC
  Administered 2021-08-05: 650 mg via ORAL
  Filled 2021-08-05: qty 2

## 2021-08-05 MED ORDER — DIPHENHYDRAMINE HCL 50 MG/ML IJ SOLN
50.0000 mg | Freq: Once | INTRAMUSCULAR | Status: AC
  Administered 2021-08-05: 50 mg via INTRAVENOUS
  Filled 2021-08-05: qty 1

## 2021-08-05 MED ORDER — SODIUM CHLORIDE 0.9% FLUSH
10.0000 mL | INTRAVENOUS | Status: DC | PRN
  Administered 2021-08-05: 10 mL via INTRAVENOUS

## 2021-08-05 MED ORDER — SODIUM CHLORIDE 0.9 % IV SOLN
500.0000 mg | Freq: Once | INTRAVENOUS | Status: AC
  Administered 2021-08-05: 500 mg via INTRAVENOUS
  Filled 2021-08-05: qty 50

## 2021-08-05 MED ORDER — SODIUM CHLORIDE 0.9% FLUSH
10.0000 mL | INTRAVENOUS | Status: DC | PRN
  Administered 2021-08-05: 10 mL

## 2021-08-05 MED ORDER — SODIUM CHLORIDE 0.9 % IV SOLN
10.0000 mg | Freq: Once | INTRAVENOUS | Status: AC
  Administered 2021-08-05: 10 mg via INTRAVENOUS
  Filled 2021-08-05: qty 10

## 2021-08-05 MED ORDER — HEPARIN SOD (PORK) LOCK FLUSH 100 UNIT/ML IV SOLN
500.0000 [IU] | Freq: Once | INTRAVENOUS | Status: AC | PRN
  Administered 2021-08-05: 500 [IU]

## 2021-08-05 MED ORDER — SODIUM CHLORIDE 0.9 % IV SOLN: Freq: Once | INTRAVENOUS | Status: AC

## 2021-08-05 MED ORDER — SODIUM CHLORIDE 0.9 % IV SOLN
65.0000 mg/m2 | Freq: Once | INTRAVENOUS | Status: AC
  Administered 2021-08-05: 100 mg via INTRAVENOUS
  Filled 2021-08-05: qty 10

## 2021-08-05 NOTE — Progress Notes (Signed)
[facility] ? ?    [name] ?[phone]   [phone] ? ?OFFICE PROGRESS NOTE ? ?[name] Perna, NP ?[id number] ?[place] [address] ? ?DIAGNOSIS: Metastatic non-small cell lung cancer initially diagnosed as stage IIIA (Tx, N2, M0) non-small cell lung cancer, adenocarcinoma diagnosed in August 2021 and presented with large right paratracheal soft tissue mass extending from the right apex to the right suprahilar and precarinal region. ? ?Molecular studies by [employer]: ? ?ATMR94fs, 31.9%, Olaparib ? ?[id number], 68.6%. None  ? ?BRAFAmplification, High (+++) ?Plasma Copy Number: 5.0 ? ?PRIOR THERAPY:  ?1) Concurrent chemoradiation with weekly carboplatin for AUC of 2 and paclitaxel 45 mg/M2.  First dose January 17, 2020.  Status post 7 cycles.  Last dose was given on February 28, 2020 ?2) Consolidation treatment with immunotherapy with Imfinzi 1500 mg IV every 4 weeks.  First dose April 09, 2020.  Status post 5 cycles.  Last dose was given July 30, 2020 discontinued secondary to disease progression with brain metastasis. ?3) Stereotactic left frontoparietal craniotomy for resection of tumor on 08/30/2020. ?4) First-line systemic chemotherapy with carboplatin for AUC of 5, Alimta 500 Mg/M2 and Keytruda 200 Mg IV every 3 weeks.  First dose Oct 08, 2020.  Status post 9 cycles.  Starting from cycle #5 he will be on maintenance treatment with Alimta and Keytruda every 3 weeks. ? ? ?CURRENT THERAPY: Second line systemic chemotherapy with reduced dose docetaxel 65 Mg/M2 and Cyramza 10 Mg/KG every 3 weeks with Neulasta support.  First dose June 24, 2021.  Status post 2 cycles. ? ?INTERVAL HISTORY: ?[name] [name] 66 y.o. male returns to the clinic today for follow-up visit accompanied by a family member.  The patient is feeling fine today with no concerning complaints except for the fatigue and mild cough.  He also continues to have shortness of breath with exertion.  He  tolerated the second cycle of his chemotherapy fairly well.  He denied having any bleeding, bruises or ecchymosis.  He denied having any chest pain or hemoptysis.  He has no nausea, vomiting, diarrhea or constipation.  He has no headache or visual changes.  He is here today for evaluation before starting cycle #3 of his treatment. ? ? ?MEDICAL HISTORY: ?Past Medical History:  ?Diagnosis Date  ? 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[place]) 12/25/2019  ? Cancer [facility])   ? COPD (chronic obstructive pulmonary disease) ([place]) 11/02/2007  ? Qualifier: Diagnosis of  By: Melvyn Novas MD, Christena Deem   ? Hypertension 12/24/2019  ? Iron deficiency anemia 08/23/2007  ? Qualifier: Diagnosis of  By: Jim Like    ? ? ?ALLERGIES:  is allergic to other. ? ?MEDICATIONS:  ?Current Outpatient Medications  ?Medication Sig Dispense Refill  ? acetaminophen (TYLENOL) 500 MG tablet Take 500 mg by mouth every 6 (six) hours as needed for mild pain or moderate pain.    ? budesonide-formoterol (SYMBICORT) 80-4.5 MCG/ACT inhaler Inhale 2 puffs into the lungs daily. 10.2 g 0  ? dexamethasone (DECADRON) 4 MG tablet Take 2 tablets by mouth twice daily the day before day of and day after the chemotherapy every 3 weeks. (Patient not taking: Reported on 07/08/2021) 40 tablet 1  ? feeding supplement (ENSURE ENLIVE / ENSURE PLUS) LIQD Take 237 mLs by mouth 2 (two) times daily between meals. 761 mL 12  ? folic acid (FOLVITE) 1 MG tablet Take 1 tablet (1 mg total) by mouth daily. 30 tablet 0  ? ipratropium-albuterol (DUONEB) 0.5-2.5 (3) MG/3ML SOLN Take 3  mLs by nebulization every 4 (four) hours as needed. 360 mL   ? levETIRAcetam (KEPPRA) 1000 MG tablet Take 1 tablet (1,000 mg total) by mouth 2 (two) times daily. 60 tablet 2  ? lidocaine-prilocaine (EMLA) cream Apply 1 application topically as needed. 30 g 1  ? mirtazapine (REMERON) 7.5 MG tablet Take 1 tablet (7.5 mg total) by mouth at bedtime. 30 tablet 0  ? Multiple Vitamin (MULTIVITAMIN) tablet Take 1 tablet by mouth daily.    ?  nicotine polacrilex (COMMIT) 2 MG lozenge Take 2 mg by mouth as needed.    ? pantoprazole (PROTONIX) 40 MG tablet Take 1 tablet (40 mg total) by mouth daily. 30 tablet 0  ? polyethylene glycol (MIRALAX / GLYCOLAX) 17 g packet Take 17 g by mouth daily. 14 each 0  ? prochlorperazine (COMPAZINE) 10 MG tablet Take 1 tablet (10 mg total) by mouth every 6 hours for nausea (Patient not taking: Reported on 07/08/2021) 30 tablet 0  ? senna (SENOKOT) 8.6 MG TABS tablet Take 1 tablet (8.6 mg total) by mouth 2 (two) times daily. 120 tablet 0  ? tiotropium (SPIRIVA HANDIHALER) 18 MCG inhalation capsule Place 1 capsule into inhaler and inhale daily. 30 capsule 1  ? traMADol (ULTRAM) 50 MG tablet Take 1 tablet (50 mg total) by mouth 2 (two) times daily as needed for moderate pain. 30 tablet 0  ? umeclidinium bromide (INCRUSE ELLIPTA) 62.5 MCG/ACT AEPB Inhale 1 puff into the lungs daily. (Patient not taking: Reported on 07/08/2021) 30 each 0  ? VENTOLIN HFA 108 (90 Base) MCG/ACT inhaler Inhale 2 puffs into the lungs every 4 (four) hours as needed for wheezing or shortness of breath. (Patient not taking: Reported on 07/08/2021)    ? ?No current facility-administered medications for this visit.  ? ?Facility-Administered Medications Ordered in Other Visits  ?Medication Dose Route Frequency Provider Last Rate Last Admin  ? sodium chloride flush (NS) 0.9 % injection 10 mL  10 mL Intravenous PRN Curt Bears, MD   10 mL at 08/05/21 3785  ? ? ?SURGICAL HISTORY:  ?Past Surgical History:  ?Procedure Laterality Date  ? APPLICATION OF CRANIAL NAVIGATION N/A 08/30/2020  ? Procedure: APPLICATION OF CRANIAL NAVIGATION;  Surgeon: Consuella Lose, MD;  Location: [facility];  Service: Neurosurgery;  Laterality: N/A;  ? CRANIOTOMY Left 08/30/2020  ? Procedure: Stereotactic left frontoparietal craniectomy for resection of tumor with brainlab;  Surgeon: Consuella Lose, MD;  Location: [facility];  Service: Neurosurgery;  Laterality: Left;  ? ENDOBRONCHIAL  ULTRASOUND N/A 12/27/2019  ? Procedure: ENDOBRONCHIAL ULTRASOUND;  Surgeon: Laurin Coder, MD;  Location: [facility];  Service: Endoscopy;  Laterality: N/A;  ? FINE NEEDLE ASPIRATION  12/27/2019  ? Procedure: FINE NEEDLE ASPIRATION;  Surgeon: Laurin Coder, MD;  Location: [facility];  Service: Endoscopy;;  ? IR IMAGING GUIDED PORT INSERTION  11/30/2020  ? VIDEO BRONCHOSCOPY N/A 12/27/2019  ? Procedure: VIDEO BRONCHOSCOPY WITHOUT FLUORO;  Surgeon: Laurin Coder, MD;  Location: [facility];  Service: Endoscopy;  Laterality: N/A;  ? ? ?REVIEW OF SYSTEMS:  A comprehensive review of systems was negative except for: Constitutional: positive for fatigue ?Respiratory: positive for dyspnea on exertion  ? ?PHYSICAL EXAMINATION: General appearance: alert, cooperative, fatigued, and no distress ?Head: Normocephalic, without obvious abnormality, atraumatic ?Neck: no adenopathy, no JVD, supple, symmetrical, trachea midline, and thyroid not enlarged, symmetric, no tenderness/mass/nodules ?Lymph nodes: Cervical, supraclavicular, and axillary nodes normal. ?Resp: clear to auscultation bilaterally ?Back: symmetric, no curvature. ROM normal. No CVA tenderness. ?  Cardio: regular rate and rhythm, S1, S2 normal, no murmur, click, rub or gallop ?GI: soft, non-tender; bowel sounds normal; no masses,  no organomegaly ?Extremities: extremities normal, atraumatic, no cyanosis or edema ? ?ECOG PERFORMANCE STATUS: 1 - Symptomatic but completely ambulatory ? ?Blood pressure 115/78, pulse (!) 117, temperature (!) 96.7 ?F (35.9 ?C), temperature source Tympanic, resp. rate 18, weight 108 lb 4 oz (49.1 kg), SpO2 98 %. ? ?LABORATORY DATA: ?Lab Results  ?Component Value Date  ? WBC 23.5 (H) 07/30/2021  ? HGB 11.5 (L) 07/30/2021  ? HCT 35.8 (L) 07/30/2021  ? MCV 75.8 (L) 07/30/2021  ? PLT 257 07/30/2021  ? ? ?  Chemistry   ?   ?Component Value Date/Time  ? NA 136 07/30/2021 0922  ? NA 144 08/03/2019 1527  ? K 3.1 (L) 07/30/2021 6435  ?  CL 96 (L) 07/30/2021 3912  ? CO2 27 07/30/2021 0922  ? BUN 11 07/30/2021 0922  ? BUN 9 08/03/2019 1527  ? CREATININE 0.66 07/30/2021 0922  ?    ?Component Value Date/Time  ? CALCIUM 9.7 07/30/2021 0922

## 2021-08-05 NOTE — Patient Instructions (Signed)
[facility]  Discharge Instructions: ?Thank you for choosing [facility] to provide your oncology and hematology care.  ? ?If you have a lab appointment with the [facility], please go directly to the [facility] and check in at the registration area. ?  ?Wear comfortable clothing and clothing appropriate for easy access to any Portacath or PICC line.  ? ?We strive to give you quality time with your provider. You may need to reschedule your appointment if you arrive late (15 or more minutes).  Arriving late affects you and other patients whose appointments are after yours.  Also, if you miss three or more appointments without notifying the office, you may be dismissed from the clinic at the provider?s discretion.    ?  ?For prescription refill requests, have your pharmacy contact our office and allow 72 hours for refills to be completed.   ? ?Today you received the following chemotherapy and/or immunotherapy agents: Cyramza & Docetaxel    ?  ?To help prevent nausea and vomiting after your treatment, we encourage you to take your nausea medication as directed. ? ?BELOW ARE SYMPTOMS THAT SHOULD BE REPORTED IMMEDIATELY: ?*FEVER GREATER THAN 100.4 F (38 ?C) OR HIGHER ?*CHILLS OR SWEATING ?*NAUSEA AND VOMITING THAT IS NOT CONTROLLED WITH YOUR NAUSEA MEDICATION ?*UNUSUAL SHORTNESS OF BREATH ?*UNUSUAL BRUISING OR BLEEDING ?*URINARY PROBLEMS (pain or burning when urinating, or frequent urination) ?*BOWEL PROBLEMS (unusual diarrhea, constipation, pain near the anus) ?TENDERNESS IN MOUTH AND THROAT WITH OR WITHOUT PRESENCE OF ULCERS (sore throat, sores in mouth, or a toothache) ?UNUSUAL RASH, SWELLING OR PAIN  ?UNUSUAL VAGINAL DISCHARGE OR ITCHING  ? ?Items with * indicate a potential emergency and should be followed up as soon as possible or go to the Emergency Department if any problems should occur. ? ?Please show the CHEMOTHERAPY ALERT CARD or IMMUNOTHERAPY ALERT CARD at  check-in to the Emergency Department and triage nurse. ? ?Should you have questions after your visit or need to cancel or reschedule your appointment, please contact [facility]  Dept: [phone]  and follow the prompts.  Office hours are 8:00 a.m. to 4:30 p.m. Monday - Friday. Please note that voicemails left after 4:00 p.m. may not be returned until the following business day.  We are closed weekends and major holidays. You have access to a nurse at all times for urgent questions. Please call the main number to the clinic Dept: [phone] and follow the prompts. ? ? ?For any non-urgent questions, you may also contact your provider using MyChart. We now offer e-Visits for anyone 92 and older to request care online for non-urgent symptoms. For details visit [email or web address]. ?  ?Also download the MyChart app! Go to the app store, search "MyChart", open the app, select [facility], and log in with your MyChart username and password. ? ?Due to Covid, a mask is required upon entering the hospital/clinic. If you do not have a mask, one will be given to you upon arrival. For doctor visits, patients may have 1 support person aged 27 or older with them. For treatment visits, patients cannot have anyone with them due to current Covid guidelines and our immunocompromised population.  ? ?

## 2021-08-05 NOTE — Progress Notes (Signed)
Ok to proceed with HR of 117 per Dr. Julien Nordmann note. "I recommend him to proceed with cycle #3 today as planned". ?

## 2021-08-05 NOTE — Telephone Encounter (Signed)
[name] [name] called and requested refill for Pantoprozole. ? ?I told [name] [name] to contact his PCP for pt refills. ?She voiced understanding. ?

## 2021-08-05 NOTE — Progress Notes (Signed)
Per Dr. Julien Nordmann, it is ok to treat pt today with  ?Taxotere and Cyramza and heart rate of 117. ?

## 2021-08-07 ENCOUNTER — Inpatient Hospital Stay: Payer: Medicaid Other

## 2021-08-07 ENCOUNTER — Encounter: Payer: Self-pay | Admitting: Nurse Practitioner

## 2021-08-07 ENCOUNTER — Other Ambulatory Visit: Payer: Self-pay

## 2021-08-07 ENCOUNTER — Inpatient Hospital Stay (HOSPITAL_BASED_OUTPATIENT_CLINIC_OR_DEPARTMENT_OTHER): Payer: Medicaid Other | Admitting: Nurse Practitioner

## 2021-08-07 VITALS — BP 97/67 | HR 108 | Temp 97.7°F | Resp 18 | Ht 60.0 in | Wt 108.9 lb

## 2021-08-07 VITALS — BP 106/72 | HR 114 | Temp 97.6°F

## 2021-08-07 DIAGNOSIS — R630 Anorexia: Secondary | ICD-10-CM

## 2021-08-07 DIAGNOSIS — C7931 Secondary malignant neoplasm of brain: Secondary | ICD-10-CM | POA: Diagnosis not present

## 2021-08-07 DIAGNOSIS — Z515 Encounter for palliative care: Secondary | ICD-10-CM | POA: Diagnosis not present

## 2021-08-07 DIAGNOSIS — R530 Neoplastic (malignant) related fatigue: Secondary | ICD-10-CM

## 2021-08-07 DIAGNOSIS — R634 Abnormal weight loss: Secondary | ICD-10-CM

## 2021-08-07 DIAGNOSIS — C3411 Malignant neoplasm of upper lobe, right bronchus or lung: Secondary | ICD-10-CM | POA: Diagnosis not present

## 2021-08-07 DIAGNOSIS — Z5112 Encounter for antineoplastic immunotherapy: Secondary | ICD-10-CM | POA: Diagnosis not present

## 2021-08-07 DIAGNOSIS — J438 Other emphysema: Secondary | ICD-10-CM

## 2021-08-07 MED ORDER — PANTOPRAZOLE SODIUM 40 MG PO TBEC
40.0000 mg | DELAYED_RELEASE_TABLET | Freq: Every day | ORAL | 0 refills | Status: DC
  Filled 2021-08-07: qty 30, 30d supply, fill #0

## 2021-08-07 MED ORDER — BUDESONIDE-FORMOTEROL FUMARATE 80-4.5 MCG/ACT IN AERO
2.0000 | INHALATION_SPRAY | Freq: Every day | RESPIRATORY_TRACT | 0 refills | Status: AC
Start: 2021-08-07 — End: ?
  Filled 2021-08-07: qty 10.2, 30d supply, fill #0

## 2021-08-07 MED ORDER — MIRTAZAPINE 15 MG PO TABS
15.0000 mg | ORAL_TABLET | Freq: Every day | ORAL | 0 refills | Status: DC
  Filled 2021-08-07: qty 30, 30d supply, fill #0

## 2021-08-07 MED ORDER — UMECLIDINIUM BROMIDE 62.5 MCG/ACT IN AEPB
1.0000 | INHALATION_SPRAY | Freq: Every day | RESPIRATORY_TRACT | 0 refills | Status: AC
  Filled 2021-08-07: qty 30, 30d supply, fill #0

## 2021-08-07 MED ORDER — PEGFILGRASTIM-CBQV 6 MG/0.6ML ~~LOC~~ SOSY
6.0000 mg | PREFILLED_SYRINGE | Freq: Once | SUBCUTANEOUS | Status: AC
  Administered 2021-08-07: 6 mg via SUBCUTANEOUS
  Filled 2021-08-07: qty 0.6

## 2021-08-07 NOTE — Progress Notes (Signed)
? ?  ?Palliative Medicine ?[facility]  ?[phone] [phone] ? ? ?Name: [name] [name] ?Date: 08/07/2021 ?MRN: [id number]  ?DOB: Oct 03, 1955 ? ?Patient Care Team: ?[name], NP as PCP - General (Internal Medicine) ?Curt Bears, MD as Consulting Physician (Oncology) ?Brunetta Genera, MD as Consulting Physician (Hematology) ?Maryanna Shape, NP as Nurse Practitioner (Oncology) ?Pickenpack-Cousar, Carlena Sax, NP as Nurse Practitioner (Nurse Practitioner)  ? ? ?INTERVAL HISTORY: ?[name] [name] is a 66 y.o. male with medical history of COPD, hypertension, and metastatic NSCLC s/p chemotherapy/immunotherapy, craniotomy (08/2020) w/p SRS to brain.  Palliative ask to follow for ongoing goals of care support. ? ?SOCIAL HISTORY:    ? reports that he has quit smoking. His smoking use included cigarettes. He smoked an average of .5 packs per day. He has never used smokeless tobacco. He reports that he does not currently use alcohol. He reports that he does not use drugs. ? ?ADVANCE DIRECTIVES:  ?On file. [name] is documented [place] ? ?CODE STATUS: Full code ? ?PAST MEDICAL HISTORY: ?Past Medical History:  ?Diagnosis Date  ? 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[place]) 12/25/2019  ? Cancer [facility])   ? COPD (chronic obstructive pulmonary disease) ([place]) 11/02/2007  ? Qualifier: Diagnosis of  By: Melvyn Novas MD, Christena Deem   ? Hypertension 12/24/2019  ? Iron deficiency anemia 08/23/2007  ? Qualifier: Diagnosis of  By: Jim Like    ? ? ?ALLERGIES:  is allergic to other. ? ?MEDICATIONS:  ?Current Outpatient Medications  ?Medication Sig Dispense Refill  ? acetaminophen (TYLENOL) 500 MG tablet Take 500 mg by mouth every 6 (six) hours as needed for mild pain or moderate pain.    ? budesonide-formoterol (SYMBICORT) 80-4.5 MCG/ACT inhaler Inhale 2 puffs into the lungs daily. 10.2 g 0  ? dexamethasone (DECADRON) 4 MG tablet Take 2 tablets by mouth twice daily the day before day of  and day after the chemotherapy every 3 weeks. (Patient not taking: Reported on 07/08/2021) 40 tablet 1  ? feeding supplement (ENSURE ENLIVE / ENSURE PLUS) LIQD Take 237 mLs by mouth 2 (two) times daily between meals. 678 mL 12  ? folic acid (FOLVITE) 1 MG tablet Take 1 tablet (1 mg total) by mouth daily. 30 tablet 0  ? ipratropium-albuterol (DUONEB) 0.5-2.5 (3) MG/3ML SOLN Take 3 mLs by nebulization every 4 (four) hours as needed. 360 mL   ? levETIRAcetam (KEPPRA) 1000 MG tablet Take 1 tablet (1,000 mg total) by mouth 2 (two) times daily. 60 tablet 2  ? lidocaine-prilocaine (EMLA) cream Apply 1 application topically as needed. 30 g 1  ? mirtazapine (REMERON) 7.5 MG tablet Take 1 tablet (7.5 mg total) by mouth at bedtime. 30 tablet 0  ? Multiple Vitamin (MULTIVITAMIN) tablet Take 1 tablet by mouth daily.    ? nicotine polacrilex (COMMIT) 2 MG lozenge Take 2 mg by mouth as needed.    ? pantoprazole (PROTONIX) 40 MG tablet Take 1 tablet (40 mg total) by mouth daily. 30 tablet 0  ? polyethylene glycol (MIRALAX / GLYCOLAX) 17 g packet Take 17 g by mouth daily. (Patient not taking: Reported on 08/05/2021) 14 each 0  ? prochlorperazine (COMPAZINE) 10 MG tablet Take 1 tablet (10 mg total) by mouth every 6 hours for nausea (Patient not taking: Reported on 07/08/2021) 30 tablet 0  ? senna (SENOKOT) 8.6 MG TABS tablet Take 1 tablet (8.6 mg total) by mouth 2 (two) times daily. (Patient not taking: Reported on 08/05/2021) 120 tablet 0  ?  tiotropium (SPIRIVA HANDIHALER) 18 MCG inhalation capsule Place 1 capsule into inhaler and inhale daily. 30 capsule 1  ? traMADol (ULTRAM) 50 MG tablet Take 1 tablet (50 mg total) by mouth 2 (two) times daily as needed for moderate pain. (Patient not taking: Reported on 08/05/2021) 30 tablet 0  ? umeclidinium bromide (INCRUSE ELLIPTA) 62.5 MCG/ACT AEPB Inhale 1 puff into the lungs daily. 30 each 0  ? VENTOLIN HFA 108 (90 Base) MCG/ACT inhaler Inhale 2 puffs into the lungs every 4 (four) hours as  needed for wheezing or shortness of breath.    ? ?No current facility-administered medications for this visit.  ? ? ?VITAL SIGNS: ?BP 97/67 (BP Location: Left Arm, Patient Position: Sitting)   Pulse (!) 108   Temp 97.7 ?F (36.5 ?C) (Oral)   Resp 18   Ht 5' (1.524 m)   Wt 108 lb 14.4 oz (49.4 kg)   SpO2 94%   BMI 21.27 kg/m?  ?Filed Weights  ? 08/07/21 1332  ?Weight: 108 lb 14.4 oz (49.4 kg)  ?  ?Estimated body mass index is 21.27 kg/m? as calculated from the following: ?  Height as of this encounter: 5' (1.524 m). ?  Weight as of this encounter: 108 lb 14.4 oz (49.4 kg). ? ? ?PERFORMANCE STATUS (ECOG) : 1 - Symptomatic but completely ambulatory ? ?Physical Exam ?General: NAD, ambulatory with walker ?Cardio: Tachycardic  ?Resp: clear bilaterally ?Neurological: AAOx3 ? ?IMPRESSION: ? ?Mr. Schreck presents today for follow-up. Continues to use transportation for his appointments. States he is doing well at home an Vickii Chafe checks on him daily and runs errands such as grocery shopping etc.  ? ?Reports occasional joint pain. This is manageable with Tylenol. Denies nausea or vomiting. Constipation or diarrhea. Is able to perform most ADLs independently. No recent falls.  ? ?I spoke with Vickii Chafe [facility]) by phone and updates provided. She expressed ongoing concerns for patients decreased appetite and ongoing weight loss.  ? ?Decreased appetite ?Clarice and  Vickii Chafe express concerns with his decreased appetite and weight loss. He reports some improvement however still has days where he has to push himself to eat. Vickii Chafe confirms she or caregiver who comes on M/W/F prepare meals throughout the day. Discussed protein shakes such as Ensure, Boost, or Premier protein. Education provided on trying small frequent meals, snacking when hungry, and eating foods that he enjoys. Also discussed creative ways to add protein such as making smoothies with added protein powder. Peggy verbalized understanding.  ? ?He is tolerating  mirtazapine. Given continued weight loss despite some improvement will increase dose. Peggy expressed appreciation.  ? ?Current weight 108lbs down from 115 lbs and 123 lbs on 2/20.  ? ?Insomnia  ?Mr. Freel and Vickii Chafe reports much better sleeping pattern at night. He is able to get a good nights sleep without interruption.  ? ? ?PLAN: ?Tylenol as needed for pain/discomfort.  ?Future appointments reviewed. ?Remeron increased to 15 mg at bedtime ?Education provided on nutrition and increased protein intake. Dietician referral for ongoing support. Ensure samples sent.  ?Peggy inquiring about direction on getting inhalers refilled. Advised to contact Sharyn Lull, NP for appointment and refills. They are unable to secure an appointment in the next 2 weeks. Will send refill in support Sharyn Lull will fill at time of visit and in future.  ?I will plan to see patient back in 2-3 weeks for ongoing support. ? ? ?Patient expressed understanding and was in agreement with this plan. He also understands that He can call the  clinic at any time with any questions, concerns, or complaints.  ? ?Time Total: 40 min.  ? ?Visit consisted of counseling and education dealing with the complex and emotionally intense issues of symptom management and palliative care in the setting of serious and potentially life-threatening illness.Greater than 50%  of this time was spent counseling and coordinating care related to the above assessment and plan. ? ?Alda Lea, AGPCNP-BC  ?[facility] ? ? ? ? ?  ?

## 2021-08-08 ENCOUNTER — Other Ambulatory Visit: Payer: Self-pay

## 2021-08-09 ENCOUNTER — Other Ambulatory Visit: Payer: Self-pay

## 2021-08-13 ENCOUNTER — Other Ambulatory Visit: Payer: Self-pay

## 2021-08-13 ENCOUNTER — Other Ambulatory Visit: Payer: Self-pay | Admitting: Internal Medicine

## 2021-08-13 ENCOUNTER — Inpatient Hospital Stay: Payer: Medicaid Other

## 2021-08-13 ENCOUNTER — Telehealth: Payer: Self-pay | Admitting: *Deleted

## 2021-08-13 DIAGNOSIS — Z95828 Presence of other vascular implants and grafts: Secondary | ICD-10-CM

## 2021-08-13 DIAGNOSIS — Z5112 Encounter for antineoplastic immunotherapy: Secondary | ICD-10-CM | POA: Diagnosis not present

## 2021-08-13 DIAGNOSIS — C7931 Secondary malignant neoplasm of brain: Secondary | ICD-10-CM

## 2021-08-13 DIAGNOSIS — C3411 Malignant neoplasm of upper lobe, right bronchus or lung: Secondary | ICD-10-CM

## 2021-08-13 LAB — CBC WITH DIFFERENTIAL (CANCER CENTER ONLY)
Abs Immature Granulocytes: 1.53 10*3/uL — ABNORMAL HIGH (ref 0.00–0.07)
Band Neutrophils: 5 %
Basophils Absolute: 0 10*3/uL (ref 0.0–0.1)
Basophils Relative: 0 %
Blasts: 0 %
Eosinophils Absolute: 0 10*3/uL (ref 0.0–0.5)
Eosinophils Relative: 0 %
HCT: 30.7 % — ABNORMAL LOW (ref 39.0–52.0)
Hemoglobin: 10.1 g/dL — ABNORMAL LOW (ref 13.0–17.0)
Lymphocytes Relative: 6 %
Lymphs Abs: 0.9 10*3/uL (ref 0.7–4.0)
MCH: 24.7 pg — ABNORMAL LOW (ref 26.0–34.0)
MCHC: 32.9 g/dL (ref 30.0–36.0)
MCV: 75.1 fL — ABNORMAL LOW (ref 80.0–100.0)
Metamyelocytes Relative: 7 %
Monocytes Absolute: 1.4 10*3/uL — ABNORMAL HIGH (ref 0.1–1.0)
Monocytes Relative: 9 %
Myelocytes: 3 %
Neutro Abs: 11.5 10*3/uL — ABNORMAL HIGH (ref 1.7–7.7)
Neutrophils Relative %: 70 %
Other: 0 %
Platelet Count: 300 10*3/uL (ref 150–400)
Promyelocytes Relative: 0 %
RBC: 4.09 MIL/uL — ABNORMAL LOW (ref 4.22–5.81)
RDW: 16.9 % — ABNORMAL HIGH (ref 11.5–15.5)
Smear Review: NORMAL
WBC Count: 15.3 10*3/uL — ABNORMAL HIGH (ref 4.0–10.5)
nRBC: 0 /100 WBC
nRBC: 0.3 % — ABNORMAL HIGH (ref 0.0–0.2)

## 2021-08-13 LAB — CMP (CANCER CENTER ONLY)
ALT: 16 U/L (ref 0–44)
AST: 24 U/L (ref 15–41)
Albumin: 3.5 g/dL (ref 3.5–5.0)
Alkaline Phosphatase: 88 U/L (ref 38–126)
Anion gap: 15 (ref 5–15)
BUN: 7 mg/dL — ABNORMAL LOW (ref 8–23)
CO2: 25 mmol/L (ref 22–32)
Calcium: 9.4 mg/dL (ref 8.9–10.3)
Chloride: 96 mmol/L — ABNORMAL LOW (ref 98–111)
Creatinine: 0.55 mg/dL — ABNORMAL LOW (ref 0.61–1.24)
GFR, Estimated: >60 mL/min (ref >60)
Glucose, Bld: 76 mg/dL (ref 70–99)
Potassium: 2.8 mmol/L — ABNORMAL LOW (ref 3.5–5.1)
Sodium: 136 mmol/L (ref 135–145)
Total Bilirubin: 0.4 mg/dL (ref 0.3–1.2)
Total Protein: 6.7 g/dL (ref 6.5–8.1)

## 2021-08-13 MED ORDER — HEPARIN SOD (PORK) LOCK FLUSH 100 UNIT/ML IV SOLN
500.0000 [IU] | INTRAVENOUS | Status: AC | PRN
  Administered 2021-08-13: 500 [IU]

## 2021-08-13 MED ORDER — SODIUM CHLORIDE 0.9% FLUSH
10.0000 mL | INTRAVENOUS | Status: AC | PRN
  Administered 2021-08-13: 10 mL

## 2021-08-13 MED ORDER — POTASSIUM CHLORIDE CRYS ER 20 MEQ PO TBCR
20.0000 meq | EXTENDED_RELEASE_TABLET | Freq: Two times a day (BID) | ORAL | 0 refills | Status: AC
  Filled 2021-08-13: qty 14, 7d supply, fill #0

## 2021-08-13 NOTE — Telephone Encounter (Signed)
Contacted patient - Ms. Bass answered. Gave her information as directed by Dr. Julien Nordmann in message below. She verbalized understanding and states she will pick up prescription. She asked for and was given Central Scheduling # to schedule patient's CT. ?

## 2021-08-13 NOTE — Telephone Encounter (Signed)
-----   Message from Curt Bears, MD sent at 08/13/2021  1:58 PM EDT ----- ?He will need potassium supplement.  I will send the prescription to his pharmacy.  Please let him know. ?----- Message ----- ?From: Interface, [facility] [place] ?Sent: 08/13/2021   8:19 AM EDT ?To: Curt Bears, MD ? ? ?

## 2021-08-14 ENCOUNTER — Other Ambulatory Visit: Payer: Self-pay

## 2021-08-20 ENCOUNTER — Other Ambulatory Visit: Payer: Self-pay

## 2021-08-20 ENCOUNTER — Ambulatory Visit (HOSPITAL_COMMUNITY)
Admission: RE | Admit: 2021-08-20 | Discharge: 2021-08-20 | Disposition: A | Discharge status: Home / Self Care | Payer: Medicaid Other | Source: Ambulatory Visit | Attending: Internal Medicine | Admitting: Internal Medicine

## 2021-08-20 ENCOUNTER — Inpatient Hospital Stay: Payer: Medicaid Other

## 2021-08-20 ENCOUNTER — Encounter: Payer: Self-pay | Admitting: Urology

## 2021-08-20 ENCOUNTER — Inpatient Hospital Stay: Payer: Medicaid Other | Attending: Internal Medicine

## 2021-08-20 DIAGNOSIS — G893 Neoplasm related pain (acute) (chronic): Secondary | ICD-10-CM | POA: Insufficient documentation

## 2021-08-20 DIAGNOSIS — C787 Secondary malignant neoplasm of liver and intrahepatic bile duct: Secondary | ICD-10-CM | POA: Insufficient documentation

## 2021-08-20 DIAGNOSIS — C3490 Malignant neoplasm of unspecified part of unspecified bronchus or lung: Secondary | ICD-10-CM | POA: Diagnosis present

## 2021-08-20 DIAGNOSIS — C7931 Secondary malignant neoplasm of brain: Secondary | ICD-10-CM | POA: Insufficient documentation

## 2021-08-20 DIAGNOSIS — C3411 Malignant neoplasm of upper lobe, right bronchus or lung: Secondary | ICD-10-CM | POA: Insufficient documentation

## 2021-08-20 DIAGNOSIS — R000 Tachycardia, unspecified: Secondary | ICD-10-CM | POA: Insufficient documentation

## 2021-08-20 DIAGNOSIS — Z95828 Presence of other vascular implants and grafts: Secondary | ICD-10-CM

## 2021-08-20 DIAGNOSIS — R634 Abnormal weight loss: Secondary | ICD-10-CM | POA: Insufficient documentation

## 2021-08-20 LAB — CBC WITH DIFFERENTIAL (CANCER CENTER ONLY)
Abs Immature Granulocytes: 0.39 10*3/uL — ABNORMAL HIGH (ref 0.00–0.07)
Basophils Absolute: 0.1 10*3/uL (ref 0.0–0.1)
Basophils Relative: 0 %
Eosinophils Absolute: 0 10*3/uL (ref 0.0–0.5)
Eosinophils Relative: 0 %
HCT: 32.3 % — ABNORMAL LOW (ref 39.0–52.0)
Hemoglobin: 10.4 g/dL — ABNORMAL LOW (ref 13.0–17.0)
Immature Granulocytes: 2 %
Lymphocytes Relative: 8 %
Lymphs Abs: 1.3 10*3/uL (ref 0.7–4.0)
MCH: 24.5 pg — ABNORMAL LOW (ref 26.0–34.0)
MCHC: 32.2 g/dL (ref 30.0–36.0)
MCV: 76 fL — ABNORMAL LOW (ref 80.0–100.0)
Monocytes Absolute: 1.3 10*3/uL — ABNORMAL HIGH (ref 0.1–1.0)
Monocytes Relative: 7 %
Neutro Abs: 14.7 10*3/uL — ABNORMAL HIGH (ref 1.7–7.7)
Neutrophils Relative %: 83 %
Platelet Count: 287 10*3/uL (ref 150–400)
RBC: 4.25 MIL/uL (ref 4.22–5.81)
RDW: 17.9 % — ABNORMAL HIGH (ref 11.5–15.5)
WBC Count: 17.8 10*3/uL — ABNORMAL HIGH (ref 4.0–10.5)
nRBC: 0.1 % (ref 0.0–0.2)

## 2021-08-20 LAB — CMP (CANCER CENTER ONLY)
ALT: 8 U/L (ref 0–44)
AST: 19 U/L (ref 15–41)
Albumin: 3.5 g/dL (ref 3.5–5.0)
Alkaline Phosphatase: 101 U/L (ref 38–126)
Anion gap: 11 (ref 5–15)
BUN: 7 mg/dL — ABNORMAL LOW (ref 8–23)
CO2: 25 mmol/L (ref 22–32)
Calcium: 9.5 mg/dL (ref 8.9–10.3)
Chloride: 102 mmol/L (ref 98–111)
Creatinine: 0.62 mg/dL (ref 0.61–1.24)
GFR, Estimated: >60 mL/min (ref >60)
Glucose, Bld: 99 mg/dL (ref 70–99)
Potassium: 4.1 mmol/L (ref 3.5–5.1)
Sodium: 138 mmol/L (ref 135–145)
Total Bilirubin: 0.4 mg/dL (ref 0.3–1.2)
Total Protein: 7 g/dL (ref 6.5–8.1)

## 2021-08-20 MED ORDER — HEPARIN SOD (PORK) LOCK FLUSH 100 UNIT/ML IV SOLN
500.0000 [IU] | Freq: Once | INTRAVENOUS | Status: AC
  Administered 2021-08-20: 500 [IU] via INTRAVENOUS

## 2021-08-20 MED ORDER — SODIUM CHLORIDE (PF) 0.9 % IJ SOLN
INTRAMUSCULAR | Status: AC
  Filled 2021-08-20: qty 50

## 2021-08-20 MED ORDER — HEPARIN SOD (PORK) LOCK FLUSH 100 UNIT/ML IV SOLN
INTRAVENOUS | Status: AC
  Filled 2021-08-20: qty 5

## 2021-08-20 MED ORDER — IOHEXOL 300 MG/ML  SOLN
100.0000 mL | Freq: Once | INTRAMUSCULAR | Status: AC | PRN
Start: 2021-08-20 — End: 2021-08-20
  Administered 2021-08-20: 100 mL via INTRAVENOUS

## 2021-08-20 MED ORDER — SODIUM CHLORIDE 0.9% FLUSH
10.0000 mL | INTRAVENOUS | Status: AC | PRN
  Administered 2021-08-20: 10 mL

## 2021-08-21 ENCOUNTER — Encounter: Payer: Self-pay | Admitting: Urology

## 2021-08-21 NOTE — Progress Notes (Signed)
Telephone appointment. I spoke w/ patient's POA "Ms. [name] [name]", and she reports that patient is having some RT sided rib pain 8/10, that extends around to his mid back. No other issues reported at this time. ? ?Meaningful use complete. ? ?Reminded Ms. [name] [name] of [name] 10:00am-08/22/21 telephone appointment w/ Freeman Caldron PA-C. I left my extension [phone] in case patient needs anything. Ms. [name] [name] verbalized understanding of information. ? ?Patient contact- POA- Ms. [name] [name] [phone] ?

## 2021-08-22 ENCOUNTER — Ambulatory Visit
Admission: RE | Admit: 2021-08-22 | Discharge: 2021-08-22 | Disposition: A | Discharge status: Home / Self Care | Payer: Medicaid Other | Source: Ambulatory Visit | Attending: Urology | Admitting: Urology

## 2021-08-22 DIAGNOSIS — C7951 Secondary malignant neoplasm of bone: Secondary | ICD-10-CM | POA: Insufficient documentation

## 2021-08-22 NOTE — Progress Notes (Signed)
?Radiation Oncology         (336) [phone] ?________________________________ ? ?Name: [name] [name] MRN: [id number]  ?Date: 08/22/2021  DOB: 11/08/1955 ? ?Post Treatment Note ? ?CC: [name], NP  Ventura Sellers, MD ? 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 ?Interval Since Last Radiation:  1 month ?07/09/21 - 07/22/21: The painful C1 metastasis was treated to 30 Gy in 10 fractions of 3 Gy each. ? ?03/26/21:  SRS//PTV2: Left parietal 1.1 cm target was treated to a prescription dose of 20 Gy in a single fraction. ?  ?08/30/20 Pre-Op SRS//PTV1:  The 3.2 cm target in the left parietal vertex was treated to 18 Gy in a single fraction, followed by surgical resection with Dr. Kathyrn Sheriff on 08/31/20. ? ?01/17/20 - 03/02/20: The patient was treated to the disease within the right lung initially to a dose of 60 Gy using a 3 field, IMRT technique. The patient then received a cone down boost treatment for an additional 6 Gy. This yielded a final total dose of 66 Gy. ? ?Narrative:  ?In summary, he has been under the care of Dr. 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 He was treated with concurrent chemoradiation, with daily IMRT under the care of Dr. Lisbeth Renshaw and weekly carboplatin/paclitaxel (7 cycles). 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 ?  ?He contacted Dr. Worthy Flank office on 08/23/19 with concerns regarding new onset right upper and lower extremity weakness. He was referred to the ED at that time and underwent non-contrast head CT showing a new 3.1 cm mass lesion in left posterior parietal convexity with a large area of surrounding vasogenic edema and associated mass effect but no  intracranial hemorrhage.  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 Due to the aneurysm clip, he was unable to undergo MRI. They proceeded with contrast head CT showing a 3.2 cm intra-axial mass lesion at the parietal vertex on the left with regional vasogenic edema. ?He met with Dr. Kathyrn Sheriff, neurosurgery, and the recommendation was to proceed with pre-op SRS followed by surgical resection of the mass which the patient was in agreement with.  Pre-op SRS was completed on 08/30/20 followed by surgical resection on 08/31/20. He tolerated the pre-op SRS well, without any acute ill side effects.  His systemic therapy was changed to carboplatin, Alimta and Keytruda with his first dose on 10/08/20. He had a posttreatment CT head on 11/29/2020 which showed substantial decrease in the left cerebral edema at the vertex with residual hypoattenuation underlying the craniotomy but no nodular enhancement at this location. There was no evidence of recurrent or new metastatic disease.  Starting from cycle #5 of his systemic therapy, he was switched to maintenance treatment with Alimta and Keytruda which he has continued to tolerate well.  Repeat systemic imaging with CT C/A/P for disease restaging on 02/08/21 showed overall disease stability with stable to slightly improved metastatic disease in the chest and without any evidence of metastatic disease in the abdomen or pelvis. However, on the post treatment follow up CT Head, performed 03/13/21, there was a new 1.1 cm enhancing lesion of the parasagittal left parietal lobe with mild adjacent  edema. The surgical cavity of the high left parietal lobe has contracted and there is thin enhancement at the margins as well as adjacent dural enhancement but no nodular enhancement to suggest recurrent disease at the surgical site.  His case and imaging were reviewed in the multidisciplinary brain conference  on 03/18/21 with consensus recommendation to proceed with a single fraction of SRS to treat the new lesion. This was discussed at his follow up visit 03/19/21 and he was in agreement to proceed. His treatment was completed 03/26/21 and he tolerated this well and was discharged home in stable condition.  Unfortunately, just a few days later on 04/01/2021 he presented to the emergency department with complaints of altered mental status and new onset aphasia.  He exhibited 2 spells of transient right lower extremity jerking while in CT which lasted for about 10 seconds, without any apparent change in his level of consciousness and no change to his aphasia.  During admission, his Keppra dosage was increased to 1000 mg p.o. twice daily and he remained seizure-free.  He was discharged to a skilled nursing facility for rehab where he currently resides and he reports that he is making some progress with physical therapy and regaining his strength.  He denies any further seizures since his discharge from the hospital and denies any headaches, visual changes, dizziness or tremor.  He is no longer having difficulty with his speech and overall, is pleased with his progress. ?He developed some progressive neck pain in February 2023 and a recent MRI C-spine showed metastasis in the C1 right lateral mass, present since November 2022 with extraosseous growth into the prevertebral space but no epidural infiltration or neural compression.  We met with him on 07/08/2021 to discuss the role of palliative radiotherapy for pain management and he was in agreement to proceed.  He completed a 2-week course of daily palliative radiotherapy to the painful lesion at C1 on 07/22/2021 and has appreciated significant improvement in the pain.     ? ?On review of systems, the patient states that he is doing well in general.  He has noticed significant improvement in the neck pain that he was experiencing prior to his most recent radiation treatment.  He  did experience some mild dysphagia with the treatments but this has completely resolved at this point.  He denies any nausea, vomiting, dizziness, changes in visual or auditory acuity, tremors or seizure activity.   His appetite is variable but he continues to eat regularly to maintain his weight. He continues with an unsteady gait and weakness in the right lower extremity but is working with physical therapy at the rehab facility.  He denies any new musculoskeletal or joint aches or pains.  He had a Port-A-Cath placed on 11/30/20 for continued systemic therapy which he feels he is tolerating well. Unfortunately, on recent restaging CT C/A/P from 08/20/2021, there is evidence of disease progression.  He is scheduled for a follow-up visit with Dr. Julien Nordmann on 08/26/2021 to discuss these results and treatment recommendations going forward.   ? ?ALLERGIES:  is allergic to other. ? ?Meds: ?Current Outpatient Medications  ?Medication Sig Dispense Refill  ? acetaminophen (TYLENOL) 500 MG tablet Take 500 mg by mouth every 6 (six) hours as needed for mild pain or moderate pain.    ? budesonide-formoterol (SYMBICORT) 80-4.5 MCG/ACT inhaler Inhale 2 puffs into the lungs daily. 10.2 g 0  ? dexamethasone (DECADRON) 4 MG tablet Take 2 tablets by mouth twice daily the day before day  of and day after the chemotherapy every 3 weeks. (Patient not taking: Reported on 07/08/2021) 40 tablet 1  ? feeding supplement (ENSURE ENLIVE / ENSURE PLUS) LIQD Take 237 mLs by mouth 2 (two) times daily between meals. 757 mL 12  ? folic acid (FOLVITE) 1 MG tablet Take 1 tablet (1 mg total) by mouth daily. (Patient not taking: Reported on 08/21/2021) 30 tablet 0  ? ipratropium-albuterol (DUONEB) 0.5-2.5 (3) MG/3ML SOLN Take 3 mLs by nebulization every 4 (four) hours as needed. 360 mL   ? levETIRAcetam (KEPPRA) 1000 MG tablet Take 1 tablet (1,000 mg total) by mouth 2 (two) times daily. 60 tablet 2  ? lidocaine-prilocaine (EMLA) cream Apply 1 application  topically as needed. 30 g 1  ? mirtazapine (REMERON) 15 MG tablet Take 1 tablet (15 mg total) by mouth at bedtime. 30 tablet 0  ? Multiple Vitamin (MULTIVITAMIN) tablet Take 1 tablet by mouth daily.    ? nicotine

## 2021-08-22 NOTE — Progress Notes (Signed)
?  Radiation Oncology         (336) [phone] ?________________________________ ? ?Name: [name] [name] MRN: [id number]  ?Date: 07/22/2021  DOB: 26-Aug-1955 ? ?End of Treatment Note ? 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 ?Indication for treatment:  Palliation      ? ?Radiation treatment dates:   07/09/21 - 07/22/21 ? ?Site/dose:   The painful C1 metastasis was treated to 30 Gy in 10 fractions of 3 Gy each. ? ?Beams/energy:   A 3D field set-up was employed with 6 MV X-rays ? ?Narrative: The patient tolerated radiation treatment relatively well. He did report mild fatigue, mild dysphagia and decreased appetite but otherwise, was without complaints and did notice improvement in his pain towards the end of treatment. ? ?Plan: The patient has completed radiation treatment. The patient will return to radiation oncology clinic for routine followup in one month. I advised him to call or return sooner if he has any questions or concerns related to his recovery or treatment. ?________________________________ ? ?Sheral Apley Tammi Klippel, M.D. ?  ?

## 2021-08-22 NOTE — Progress Notes (Signed)
[place] ?OFFICE PROGRESS NOTE ? ?[name], NP ?[address] ?[place] [address] ? ?DIAGNOSIS: : Metastatic non-small cell lung cancer initially diagnosed as stage IIIA (Tx, N2, M0) non-small cell lung cancer, adenocarcinoma diagnosed in August 2021 and presented with large right paratracheal soft tissue mass extending from the right apex to the right suprahilar and precarinal region. ?  ?Molecular studies by [employer]: ?  ?ATMR930fs, 31.9%, Olaparib ?  ?[id number], 68.6%. None      ?  ?BRAFAmplification, High (+++) ?Plasma Copy Number: 5.0 ? ?PRIOR THERAPY: ?1) Concurrent chemoradiation with weekly carboplatin for AUC of 2 and paclitaxel 45 mg/M2.  First dose January 17, 2020.  Status post 7 cycles.  Last dose was given on February 28, 2020 ?2) Consolidation treatment with immunotherapy with Imfinzi 1500 mg IV every 4 weeks.  First dose April 09, 2020.  Status post 5 cycles.  Last dose was given July 30, 2020 discontinued secondary to disease progression with brain metastasis. ?3) Stereotactic left frontoparietal craniotomy for resection of tumor on 08/30/2020. ?4) First-line systemic chemotherapy with carboplatin for AUC of 5, Alimta 500 Mg/M2 and Keytruda 200 Mg IV every 3 weeks.  First dose Oct 08, 2020.  Status post 9 cycles.  Starting from cycle #5 he will be on maintenance treatment with Alimta and Keytruda every 3 weeks. ?5) Palliative radiation to the C1 metastatic lesion under the care of Dr. Tammi Klippel. His last day of radiation is expected for 07/22/21. ?6) 1) Second line systemic chemotherapy with reduced dose docetaxel 65 Mg/M2 and Cyramza 10 Mg/KG every 3 weeks with Neulasta support.  First dose June 24, 2021.  Status post 3 cycles. Discontinued due to disease progression.  ?  ? ?CURRENT THERAPY: None  ? ?INTERVAL HISTORY: ?[name] [name] 66 y.o. male returns to the clinic today for a follow-up visit accompanied by his friend Vickii Chafe who is his power of attorney.   He was last seen in the clinic on 08/05/2021.  The patient was found to have disease progression a few months ago and his treatment was changed to docetaxel and Cyramza. He does have ongoing issues with weight loss, cachexia, and decreased appetite.  He lost an additional 10 pounds since his last appointment which is significant.  He is followed closely by palliative care. They recommended protein drinks, increase the dose of his Remeron.  Additionally, he is followed closely by radiation oncology neurooncology for his history of seizures.  Approximately 1 month ago, the patient completed palliative radiation to the C1 metastatic lesion under the care of Dr. Tammi Klippel.  He has a telephone follow-up with them last week.  ? ?The patient has been having a challenging time over the last few weeks with increased intermittent right upper quadrant discomfort.  This started a few days prior to his recent restaging CT scan.  There was no sign of infection or biliary abnormalities on his scan.  He reports the pain comes and goes and is worse with laying on the right side.  He has been taking Tylenol for pain which helps "sometimes".  He is also had increased hoarseness in his voice over the last few days.  He denies any dyspnea at rest and sometimes may be short of breath with exertion.  He has a cough "sometimes" which produces phlegm.  He denies any hemoptysis.  Sometimes the pain in his right upper quadrant radiates to his chest.  He denies any fevers headaches, or visual changes.  The patient's friend  noticed that he missed a few doses of his medications last week which is unusual for him.  He denies any nausea, vomiting, diarrhea, or constipation. He recently had a restaging CT scan performed.  He is here today for evaluation and repeat blood work and to review his scan results. ? ?MEDICAL HISTORY: ?Past Medical History:  ?Diagnosis Date  ? 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[place]) 12/25/2019  ? Cancer [facility])   ? COPD (chronic obstructive  pulmonary disease) ([place]) 11/02/2007  ? Qualifier: Diagnosis of  By: Melvyn Novas MD, Christena Deem   ? Hypertension 12/24/2019  ? Iron deficiency anemia 08/23/2007  ? Qualifier: Diagnosis of  By: Jim Like    ? ? ?ALLERGIES:  is allergic to other. ? ?MEDICATIONS:  ?Current Outpatient Medications  ?Medication Sig Dispense Refill  ? oxyCODONE-acetaminophen (PERCOCET/ROXICET) 5-325 MG tablet Take 1 tablet by mouth every 6 (six) hours as needed for severe pain. 30 tablet 0  ? acetaminophen (TYLENOL) 500 MG tablet Take 500 mg by mouth every 6 (six) hours as needed for mild pain or moderate pain.    ? budesonide-formoterol (SYMBICORT) 80-4.5 MCG/ACT inhaler Inhale 2 puffs into the lungs daily. 10.2 g 0  ? dexamethasone (DECADRON) 4 MG tablet Take 2 tablets by mouth twice daily the day before day of and day after the chemotherapy every 3 weeks. (Patient not taking: Reported on 07/08/2021) 40 tablet 1  ? feeding supplement (ENSURE ENLIVE / ENSURE PLUS) LIQD Take 237 mLs by mouth 2 (two) times daily between meals. 923 mL 12  ? folic acid (FOLVITE) 1 MG tablet Take 1 tablet (1 mg total) by mouth daily. (Patient not taking: Reported on 08/21/2021) 30 tablet 0  ? ipratropium-albuterol (DUONEB) 0.5-2.5 (3) MG/3ML SOLN Take 3 mLs by nebulization every 4 (four) hours as needed. 360 mL   ? levETIRAcetam (KEPPRA) 1000 MG tablet Take 1 tablet (1,000 mg total) by mouth 2 (two) times daily. 60 tablet 2  ? lidocaine-prilocaine (EMLA) cream Apply 1 application topically as needed. 30 g 1  ? mirtazapine (REMERON) 15 MG tablet Take 1 tablet (15 mg total) by mouth at bedtime. 30 tablet 0  ? Multiple Vitamin (MULTIVITAMIN) tablet Take 1 tablet by mouth daily.    ? nicotine polacrilex (COMMIT) 2 MG lozenge Take 2 mg by mouth as needed.    ? pantoprazole (PROTONIX) 40 MG tablet Take 1 tablet (40 mg total) by mouth daily. 30 tablet 0  ? polyethylene glycol (MIRALAX / GLYCOLAX) 17 g packet Take 17 g by mouth daily. (Patient not taking: Reported on  08/05/2021) 14 each 0  ? potassium chloride SA (KLOR-CON M) 20 MEQ tablet Take 1 tablet (20 mEq total) by mouth 2 (two) times daily. 14 tablet 0  ? prochlorperazine (COMPAZINE) 10 MG tablet Take 1 tablet (10 mg total) by mouth every 6 hours for nausea (Patient not taking: Reported on 07/08/2021) 30 tablet 0  ? senna (SENOKOT) 8.6 MG TABS tablet Take 1 tablet (8.6 mg total) by mouth 2 (two) times daily. (Patient not taking: Reported on 08/05/2021) 120 tablet 0  ? tiotropium (SPIRIVA HANDIHALER) 18 MCG inhalation capsule Place 1 capsule into inhaler and inhale daily. 30 capsule 1  ? umeclidinium bromide (INCRUSE ELLIPTA) 62.5 MCG/ACT AEPB Inhale 1 puff into the lungs daily. 30 each 0  ? VENTOLIN HFA 108 (90 Base) MCG/ACT inhaler Inhale 2 puffs into the lungs every 4 (four) hours as needed for wheezing or shortness of breath.    ? ?No current facility-administered medications for this  visit.  ? ?Facility-Administered Medications Ordered in Other Visits  ?Medication Dose Route Frequency Provider Last Rate Last Admin  ? 0.9 %  sodium chloride infusion   Intravenous Continuous Decorey Wahlert L, PA-C      ? ? ?SURGICAL HISTORY:  ?Past Surgical History:  ?Procedure Laterality Date  ? APPLICATION OF CRANIAL NAVIGATION N/A 08/30/2020  ? Procedure: APPLICATION OF CRANIAL NAVIGATION;  Surgeon: Consuella Lose, MD;  Location: [facility];  Service: Neurosurgery;  Laterality: N/A;  ? CRANIOTOMY Left 08/30/2020  ? Procedure: Stereotactic left frontoparietal craniectomy for resection of tumor with brainlab;  Surgeon: Consuella Lose, MD;  Location: [facility];  Service: Neurosurgery;  Laterality: Left;  ? ENDOBRONCHIAL ULTRASOUND N/A 12/27/2019  ? Procedure: ENDOBRONCHIAL ULTRASOUND;  Surgeon: Laurin Coder, MD;  Location: [facility];  Service: Endoscopy;  Laterality: N/A;  ? FINE NEEDLE ASPIRATION  12/27/2019  ? Procedure: FINE NEEDLE ASPIRATION;  Surgeon: Laurin Coder, MD;  Location: [facility];  Service: Endoscopy;;   ? IR IMAGING GUIDED PORT INSERTION  11/30/2020  ? VIDEO BRONCHOSCOPY N/A 12/27/2019  ? Procedure: VIDEO BRONCHOSCOPY WITHOUT FLUORO;  Surgeon: Laurin Coder, MD;  Location: [facility];  Service: Endoscopy;

## 2021-08-23 ENCOUNTER — Other Ambulatory Visit: Payer: Self-pay

## 2021-08-23 MED FILL — Dexamethasone Sodium Phosphate Inj 100 MG/10ML: INTRAMUSCULAR | Qty: 1 | Status: AC

## 2021-08-26 ENCOUNTER — Other Ambulatory Visit: Payer: Self-pay

## 2021-08-26 ENCOUNTER — Inpatient Hospital Stay: Payer: Medicaid Other

## 2021-08-26 ENCOUNTER — Inpatient Hospital Stay: Payer: Medicaid Other | Admitting: Nutrition

## 2021-08-26 ENCOUNTER — Inpatient Hospital Stay: Payer: Medicaid Other | Admitting: Nurse Practitioner

## 2021-08-26 ENCOUNTER — Inpatient Hospital Stay (HOSPITAL_BASED_OUTPATIENT_CLINIC_OR_DEPARTMENT_OTHER): Payer: Medicaid Other | Admitting: Physician Assistant

## 2021-08-26 VITALS — BP 116/79 | HR 124 | Temp 97.5°F | Resp 18 | Ht 60.0 in | Wt 99.6 lb

## 2021-08-26 DIAGNOSIS — C3411 Malignant neoplasm of upper lobe, right bronchus or lung: Secondary | ICD-10-CM

## 2021-08-26 DIAGNOSIS — Z95828 Presence of other vascular implants and grafts: Secondary | ICD-10-CM

## 2021-08-26 DIAGNOSIS — C7931 Secondary malignant neoplasm of brain: Secondary | ICD-10-CM

## 2021-08-26 DIAGNOSIS — R000 Tachycardia, unspecified: Secondary | ICD-10-CM | POA: Diagnosis not present

## 2021-08-26 DIAGNOSIS — G893 Neoplasm related pain (acute) (chronic): Secondary | ICD-10-CM | POA: Diagnosis not present

## 2021-08-26 DIAGNOSIS — C787 Secondary malignant neoplasm of liver and intrahepatic bile duct: Secondary | ICD-10-CM | POA: Diagnosis not present

## 2021-08-26 DIAGNOSIS — Z7189 Other specified counseling: Secondary | ICD-10-CM

## 2021-08-26 DIAGNOSIS — R634 Abnormal weight loss: Secondary | ICD-10-CM | POA: Diagnosis not present

## 2021-08-26 LAB — CMP (CANCER CENTER ONLY)
ALT: 6 U/L (ref 0–44)
AST: 13 U/L — ABNORMAL LOW (ref 15–41)
Albumin: 3.5 g/dL (ref 3.5–5.0)
Alkaline Phosphatase: 86 U/L (ref 38–126)
Anion gap: 12 (ref 5–15)
BUN: 12 mg/dL (ref 8–23)
CO2: 26 mmol/L (ref 22–32)
Calcium: 9.4 mg/dL (ref 8.9–10.3)
Chloride: 97 mmol/L — ABNORMAL LOW (ref 98–111)
Creatinine: 0.58 mg/dL — ABNORMAL LOW (ref 0.61–1.24)
GFR, Estimated: >60 mL/min (ref >60)
Glucose, Bld: 117 mg/dL — ABNORMAL HIGH (ref 70–99)
Potassium: 3.7 mmol/L (ref 3.5–5.1)
Sodium: 135 mmol/L (ref 135–145)
Total Bilirubin: 0.3 mg/dL (ref 0.3–1.2)
Total Protein: 7 g/dL (ref 6.5–8.1)

## 2021-08-26 LAB — CBC WITH DIFFERENTIAL (CANCER CENTER ONLY)
Abs Immature Granulocytes: 0.6 10*3/uL — ABNORMAL HIGH (ref 0.00–0.07)
Basophils Absolute: 0.1 10*3/uL (ref 0.0–0.1)
Basophils Relative: 0 %
Eosinophils Absolute: 0 10*3/uL (ref 0.0–0.5)
Eosinophils Relative: 0 %
HCT: 29.4 % — ABNORMAL LOW (ref 39.0–52.0)
Hemoglobin: 9.7 g/dL — ABNORMAL LOW (ref 13.0–17.0)
Immature Granulocytes: 3 %
Lymphocytes Relative: 3 %
Lymphs Abs: 0.6 10*3/uL — ABNORMAL LOW (ref 0.7–4.0)
MCH: 24.7 pg — ABNORMAL LOW (ref 26.0–34.0)
MCHC: 33 g/dL (ref 30.0–36.0)
MCV: 75 fL — ABNORMAL LOW (ref 80.0–100.0)
Monocytes Absolute: 0.5 10*3/uL (ref 0.1–1.0)
Monocytes Relative: 2 %
Neutro Abs: 20.5 10*3/uL — ABNORMAL HIGH (ref 1.7–7.7)
Neutrophils Relative %: 92 %
Platelet Count: 365 10*3/uL (ref 150–400)
RBC: 3.92 MIL/uL — ABNORMAL LOW (ref 4.22–5.81)
RDW: 18.6 % — ABNORMAL HIGH (ref 11.5–15.5)
WBC Count: 22.3 10*3/uL — ABNORMAL HIGH (ref 4.0–10.5)
nRBC: 0 % (ref 0.0–0.2)

## 2021-08-26 MED ORDER — SODIUM CHLORIDE 0.9% FLUSH
10.0000 mL | Freq: Once | INTRAVENOUS | Status: AC
  Administered 2021-08-26: 10 mL via INTRAVENOUS

## 2021-08-26 MED ORDER — SODIUM CHLORIDE 0.9% FLUSH
10.0000 mL | INTRAVENOUS | Status: DC | PRN
  Administered 2021-08-26: 10 mL via INTRAVENOUS

## 2021-08-26 MED ORDER — OXYCODONE-ACETAMINOPHEN 5-325 MG PO TABS
1.0000 | ORAL_TABLET | Freq: Four times a day (QID) | ORAL | 0 refills | Status: AC | PRN
  Filled 2021-08-26: qty 30, 8d supply, fill #0

## 2021-08-26 MED ORDER — SODIUM CHLORIDE 0.9 % IV SOLN: INTRAVENOUS | Status: DC

## 2021-08-26 MED ORDER — HEPARIN SOD (PORK) LOCK FLUSH 100 UNIT/ML IV SOLN
500.0000 [IU] | Freq: Once | INTRAVENOUS | Status: AC
  Administered 2021-08-26: 500 [IU] via INTRAVENOUS

## 2021-08-26 NOTE — Patient Instructions (Signed)

## 2021-08-26 NOTE — Progress Notes (Signed)
Referral has been made to [facility]. I've spoken with Margaretmary Eddy at [phone] who accepted the referral. ?

## 2021-08-26 NOTE — Progress Notes (Signed)
Chart reviewed and noted patient now transitioning to hospice for metastatic non-small cell lung cancer.  Weight has decreased to 99 pounds from 130 pounds in January, 2023.  Patient to receive IV fluids today.  Provided 1 complementary case of Ensure Plus.  Encourage patient to contact RD for further needs. ?

## 2021-08-26 NOTE — Progress Notes (Signed)
Per Cassie, no treatment today.  IL IVF ordered ?

## 2021-08-28 ENCOUNTER — Inpatient Hospital Stay: Payer: Medicaid Other

## 2021-08-28 ENCOUNTER — Other Ambulatory Visit: Payer: Self-pay

## 2021-08-28 MED ORDER — POLYETHYLENE GLYCOL 3350 17 GM/SCOOP PO POWD
ORAL | 0 refills | Status: AC
  Filled 2021-08-28: qty 238, 14d supply, fill #0

## 2021-09-03 ENCOUNTER — Inpatient Hospital Stay: Payer: Medicaid Other

## 2021-09-09 ENCOUNTER — Other Ambulatory Visit: Payer: Self-pay

## 2021-09-09 ENCOUNTER — Encounter: Payer: Self-pay | Admitting: Physician Assistant

## 2021-09-09 ENCOUNTER — Encounter: Payer: Self-pay | Admitting: Internal Medicine

## 2021-09-09 MED ORDER — LEVETIRACETAM 1000 MG PO TABS
1000.0000 mg | ORAL_TABLET | Freq: Two times a day (BID) | ORAL | 1 refills | Status: AC
  Filled 2021-09-09: qty 60, 30d supply, fill #0

## 2021-09-09 MED ORDER — PANTOPRAZOLE SODIUM 40 MG PO TBEC
40.0000 mg | DELAYED_RELEASE_TABLET | Freq: Every day | ORAL | 1 refills | Status: AC
  Filled 2021-09-09: qty 30, 30d supply, fill #0

## 2021-09-09 MED ORDER — MIRTAZAPINE 15 MG PO TABS
15.0000 mg | ORAL_TABLET | Freq: Every day | ORAL | 0 refills | Status: AC
  Filled 2021-09-09: qty 30, 30d supply, fill #0

## 2021-09-10 ENCOUNTER — Inpatient Hospital Stay: Payer: Medicaid Other

## 2021-09-10 ENCOUNTER — Other Ambulatory Visit: Payer: Self-pay

## 2021-09-16 ENCOUNTER — Other Ambulatory Visit: Payer: Self-pay

## 2021-09-16 DEATH — deceased

## 2021-09-17 ENCOUNTER — Other Ambulatory Visit: Payer: Medicaid Other

## 2021-09-17 ENCOUNTER — Ambulatory Visit: Payer: Medicaid Other | Admitting: Internal Medicine

## 2021-09-17 ENCOUNTER — Ambulatory Visit: Payer: Medicaid Other

## 2021-09-19 ENCOUNTER — Ambulatory Visit: Payer: Medicaid Other

## 2021-09-23 ENCOUNTER — Ambulatory Visit: Payer: Medicaid Other | Admitting: Podiatry

## 2021-09-27 ENCOUNTER — Other Ambulatory Visit: Payer: Medicaid Other

## 2021-09-30 ENCOUNTER — Ambulatory Visit: Payer: Medicaid Other | Admitting: Internal Medicine

## 2021-10-07 ENCOUNTER — Encounter: Payer: Self-pay | Admitting: Physician Assistant

## 2021-10-07 ENCOUNTER — Encounter: Payer: Self-pay | Admitting: Internal Medicine

## 2021-10-08 ENCOUNTER — Encounter: Payer: Self-pay | Admitting: Physician Assistant

## 2021-10-08 ENCOUNTER — Encounter: Payer: Self-pay | Admitting: Internal Medicine

## 2021-10-09 ENCOUNTER — Encounter: Payer: Self-pay | Admitting: Physician Assistant

## 2021-10-09 ENCOUNTER — Encounter: Payer: Self-pay | Admitting: Internal Medicine

## 2021-10-10 ENCOUNTER — Encounter: Payer: Self-pay | Admitting: Physician Assistant

## 2021-10-10 ENCOUNTER — Encounter: Payer: Self-pay | Admitting: Internal Medicine

## 2021-10-15 ENCOUNTER — Encounter: Payer: Self-pay | Admitting: Internal Medicine

## 2021-10-15 ENCOUNTER — Encounter: Payer: Self-pay | Admitting: Physician Assistant

## 2021-11-13 ENCOUNTER — Ambulatory Visit: Payer: Medicaid Other | Admitting: Neurology

## 2021-11-13 ENCOUNTER — Encounter: Payer: Self-pay | Admitting: Internal Medicine

## 2021-11-13 ENCOUNTER — Encounter: Payer: Self-pay | Admitting: Physician Assistant

## 2021-12-03 ENCOUNTER — Encounter: Payer: Self-pay | Admitting: Radiation Therapy
